# Patient Record
Sex: Female | Born: 1950 | Race: Black or African American | Hispanic: No | Marital: Married | Smoking: Never smoker
Health system: Southern US, Community
[De-identification: ages and names within clinical notes are randomized; demographics above are authoritative.]

## PROBLEM LIST (undated history)

## (undated) DIAGNOSIS — C50319 Malignant neoplasm of lower-inner quadrant of unspecified female breast: Principal | ICD-10-CM

## (undated) DIAGNOSIS — Z9221 Personal history of antineoplastic chemotherapy: Secondary | ICD-10-CM

## (undated) DIAGNOSIS — I4819 Other persistent atrial fibrillation: Secondary | ICD-10-CM

## (undated) DIAGNOSIS — Z22322 Carrier or suspected carrier of Methicillin resistant Staphylococcus aureus: Secondary | ICD-10-CM

## (undated) DIAGNOSIS — G709 Myoneural disorder, unspecified: Secondary | ICD-10-CM

## (undated) DIAGNOSIS — I890 Lymphedema, not elsewhere classified: Secondary | ICD-10-CM

## (undated) DIAGNOSIS — R011 Cardiac murmur, unspecified: Secondary | ICD-10-CM

## (undated) DIAGNOSIS — M1990 Unspecified osteoarthritis, unspecified site: Secondary | ICD-10-CM

## (undated) DIAGNOSIS — G4733 Obstructive sleep apnea (adult) (pediatric): Secondary | ICD-10-CM

## (undated) DIAGNOSIS — IMO0001 Reserved for inherently not codable concepts without codable children: Secondary | ICD-10-CM

## (undated) DIAGNOSIS — Z923 Personal history of irradiation: Secondary | ICD-10-CM

## (undated) DIAGNOSIS — E237 Disorder of pituitary gland, unspecified: Secondary | ICD-10-CM

## (undated) DIAGNOSIS — H409 Unspecified glaucoma: Secondary | ICD-10-CM

## (undated) DIAGNOSIS — I10 Essential (primary) hypertension: Secondary | ICD-10-CM

## (undated) DIAGNOSIS — C50919 Malignant neoplasm of unspecified site of unspecified female breast: Secondary | ICD-10-CM

## (undated) DIAGNOSIS — E785 Hyperlipidemia, unspecified: Secondary | ICD-10-CM

## (undated) DIAGNOSIS — Z5189 Encounter for other specified aftercare: Secondary | ICD-10-CM

## (undated) HISTORY — DX: Other persistent atrial fibrillation: I48.19

## (undated) HISTORY — DX: Essential (primary) hypertension: I10

## (undated) HISTORY — DX: Malignant neoplasm of unspecified site of unspecified female breast: C50.919

## (undated) HISTORY — DX: Personal history of antineoplastic chemotherapy: Z92.21

## (undated) HISTORY — DX: Carrier or suspected carrier of methicillin resistant Staphylococcus aureus: Z22.322

## (undated) HISTORY — PX: BREAST SURGERY: SHX581

## (undated) HISTORY — DX: Malignant neoplasm of lower-inner quadrant of unspecified female breast: C50.319

## (undated) HISTORY — PX: EYE SURGERY: SHX253

## (undated) HISTORY — DX: Obstructive sleep apnea (adult) (pediatric): G47.33

## (undated) HISTORY — PX: DILATION AND CURETTAGE OF UTERUS: SHX78

## (undated) HISTORY — DX: Lymphedema, not elsewhere classified: I89.0

## (undated) HISTORY — PX: TUBAL LIGATION: SHX77

## (undated) HISTORY — PX: CARPAL TUNNEL RELEASE: SHX101

## (undated) HISTORY — PX: OTHER SURGICAL HISTORY: SHX169

---

## 1985-05-26 HISTORY — PX: CHOLECYSTECTOMY: SHX55

## 1997-06-09 ENCOUNTER — Encounter: Payer: Self-pay | Admitting: Pulmonary Disease

## 1997-11-04 ENCOUNTER — Emergency Department (HOSPITAL_COMMUNITY): Admission: EM | Admit: 1997-11-04 | Discharge: 1997-11-04 | Payer: Self-pay | Admitting: *Deleted

## 1998-06-15 ENCOUNTER — Ambulatory Visit: Admission: RE | Admit: 1998-06-15 | Discharge: 1998-06-15 | Payer: Self-pay | Admitting: *Deleted

## 1999-03-26 ENCOUNTER — Other Ambulatory Visit: Admission: RE | Admit: 1999-03-26 | Discharge: 1999-03-26 | Payer: Self-pay | Admitting: Obstetrics and Gynecology

## 1999-03-29 ENCOUNTER — Ambulatory Visit (HOSPITAL_COMMUNITY): Admission: RE | Admit: 1999-03-29 | Discharge: 1999-03-29 | Payer: Self-pay | Admitting: Obstetrics and Gynecology

## 1999-03-29 ENCOUNTER — Encounter: Payer: Self-pay | Admitting: Obstetrics and Gynecology

## 1999-04-03 ENCOUNTER — Encounter (INDEPENDENT_AMBULATORY_CARE_PROVIDER_SITE_OTHER): Payer: Self-pay | Admitting: Specialist

## 1999-04-03 ENCOUNTER — Ambulatory Visit (HOSPITAL_COMMUNITY): Admission: RE | Admit: 1999-04-03 | Discharge: 1999-04-03 | Payer: Self-pay | Admitting: Obstetrics and Gynecology

## 1999-04-03 ENCOUNTER — Encounter: Payer: Self-pay | Admitting: Obstetrics and Gynecology

## 1999-04-10 ENCOUNTER — Encounter: Payer: Self-pay | Admitting: Obstetrics and Gynecology

## 1999-04-10 ENCOUNTER — Encounter: Admission: RE | Admit: 1999-04-10 | Discharge: 1999-04-10 | Payer: Self-pay | Admitting: Obstetrics and Gynecology

## 2000-04-08 ENCOUNTER — Encounter: Payer: Self-pay | Admitting: Pulmonary Disease

## 2000-04-09 ENCOUNTER — Encounter: Admission: RE | Admit: 2000-04-09 | Discharge: 2000-07-08 | Payer: Self-pay | Admitting: Pulmonary Disease

## 2000-04-10 ENCOUNTER — Encounter: Admission: RE | Admit: 2000-04-10 | Discharge: 2000-04-10 | Payer: Self-pay | Admitting: Obstetrics and Gynecology

## 2000-04-10 ENCOUNTER — Encounter: Payer: Self-pay | Admitting: Obstetrics and Gynecology

## 2000-04-28 ENCOUNTER — Other Ambulatory Visit: Admission: RE | Admit: 2000-04-28 | Discharge: 2000-04-28 | Payer: Self-pay | Admitting: Obstetrics and Gynecology

## 2000-05-09 ENCOUNTER — Encounter: Payer: Self-pay | Admitting: Pulmonary Disease

## 2000-05-09 ENCOUNTER — Ambulatory Visit (HOSPITAL_BASED_OUTPATIENT_CLINIC_OR_DEPARTMENT_OTHER): Admission: RE | Admit: 2000-05-09 | Discharge: 2000-05-09 | Payer: Self-pay | Admitting: Pulmonary Disease

## 2000-07-23 ENCOUNTER — Encounter: Admission: RE | Admit: 2000-07-23 | Discharge: 2000-10-21 | Payer: Self-pay | Admitting: Pulmonary Disease

## 2001-01-16 ENCOUNTER — Emergency Department (HOSPITAL_COMMUNITY): Admission: EM | Admit: 2001-01-16 | Discharge: 2001-01-16 | Payer: Self-pay

## 2001-04-12 ENCOUNTER — Encounter: Payer: Self-pay | Admitting: Obstetrics and Gynecology

## 2001-04-12 ENCOUNTER — Encounter: Admission: RE | Admit: 2001-04-12 | Discharge: 2001-04-12 | Payer: Self-pay | Admitting: Obstetrics and Gynecology

## 2002-05-04 ENCOUNTER — Encounter: Payer: Self-pay | Admitting: Obstetrics and Gynecology

## 2002-05-04 ENCOUNTER — Encounter: Admission: RE | Admit: 2002-05-04 | Discharge: 2002-05-04 | Payer: Self-pay | Admitting: Obstetrics and Gynecology

## 2002-05-23 ENCOUNTER — Other Ambulatory Visit: Admission: RE | Admit: 2002-05-23 | Discharge: 2002-05-23 | Payer: Self-pay | Admitting: Obstetrics and Gynecology

## 2002-12-30 ENCOUNTER — Emergency Department (HOSPITAL_COMMUNITY): Admission: AD | Admit: 2002-12-30 | Discharge: 2002-12-30 | Payer: Self-pay | Admitting: Emergency Medicine

## 2003-03-13 ENCOUNTER — Ambulatory Visit (HOSPITAL_COMMUNITY): Admission: RE | Admit: 2003-03-13 | Discharge: 2003-03-13 | Payer: Self-pay | Admitting: Gastroenterology

## 2003-03-13 ENCOUNTER — Encounter (INDEPENDENT_AMBULATORY_CARE_PROVIDER_SITE_OTHER): Payer: Self-pay | Admitting: Specialist

## 2003-05-22 ENCOUNTER — Encounter: Admission: RE | Admit: 2003-05-22 | Discharge: 2003-05-22 | Payer: Self-pay | Admitting: Obstetrics and Gynecology

## 2003-07-27 ENCOUNTER — Encounter (INDEPENDENT_AMBULATORY_CARE_PROVIDER_SITE_OTHER): Payer: Self-pay | Admitting: Specialist

## 2003-07-27 ENCOUNTER — Ambulatory Visit (HOSPITAL_COMMUNITY): Admission: RE | Admit: 2003-07-27 | Discharge: 2003-07-27 | Payer: Self-pay | Admitting: Obstetrics and Gynecology

## 2003-09-29 ENCOUNTER — Encounter: Admission: RE | Admit: 2003-09-29 | Discharge: 2003-09-29 | Payer: Self-pay | Admitting: General Surgery

## 2003-10-02 ENCOUNTER — Encounter (INDEPENDENT_AMBULATORY_CARE_PROVIDER_SITE_OTHER): Payer: Self-pay | Admitting: *Deleted

## 2003-10-02 ENCOUNTER — Ambulatory Visit (HOSPITAL_COMMUNITY): Admission: RE | Admit: 2003-10-02 | Discharge: 2003-10-02 | Payer: Self-pay | Admitting: General Surgery

## 2003-10-02 ENCOUNTER — Ambulatory Visit (HOSPITAL_BASED_OUTPATIENT_CLINIC_OR_DEPARTMENT_OTHER): Admission: RE | Admit: 2003-10-02 | Discharge: 2003-10-02 | Payer: Self-pay | Admitting: General Surgery

## 2004-04-10 ENCOUNTER — Encounter: Admission: RE | Admit: 2004-04-10 | Discharge: 2004-04-10 | Payer: Self-pay | Admitting: Family Medicine

## 2004-05-22 ENCOUNTER — Ambulatory Visit (HOSPITAL_COMMUNITY): Admission: RE | Admit: 2004-05-22 | Discharge: 2004-05-22 | Payer: Self-pay | Admitting: Obstetrics and Gynecology

## 2004-06-05 ENCOUNTER — Encounter: Admission: RE | Admit: 2004-06-05 | Discharge: 2004-06-05 | Payer: Self-pay | Admitting: Obstetrics and Gynecology

## 2004-06-24 ENCOUNTER — Encounter: Admission: RE | Admit: 2004-06-24 | Discharge: 2004-06-24 | Payer: Self-pay | Admitting: Obstetrics and Gynecology

## 2004-07-24 ENCOUNTER — Ambulatory Visit: Payer: Self-pay | Admitting: Pulmonary Disease

## 2004-09-26 ENCOUNTER — Encounter: Admission: RE | Admit: 2004-09-26 | Discharge: 2004-09-26 | Payer: Self-pay | Admitting: Neurosurgery

## 2005-04-02 ENCOUNTER — Encounter: Admission: RE | Admit: 2005-04-02 | Discharge: 2005-04-02 | Payer: Self-pay | Admitting: Neurosurgery

## 2005-05-23 ENCOUNTER — Encounter: Admission: RE | Admit: 2005-05-23 | Discharge: 2005-05-23 | Payer: Self-pay | Admitting: Obstetrics and Gynecology

## 2005-05-26 HISTORY — PX: ABDOMINAL HYSTERECTOMY: SHX81

## 2005-06-06 ENCOUNTER — Other Ambulatory Visit: Admission: RE | Admit: 2005-06-06 | Discharge: 2005-06-06 | Payer: Self-pay | Admitting: Obstetrics and Gynecology

## 2005-10-01 ENCOUNTER — Encounter: Admission: RE | Admit: 2005-10-01 | Discharge: 2005-10-01 | Payer: Self-pay | Admitting: Neurosurgery

## 2005-10-30 ENCOUNTER — Ambulatory Visit (HOSPITAL_COMMUNITY): Admission: RE | Admit: 2005-10-30 | Discharge: 2005-10-30 | Payer: Self-pay | Admitting: Obstetrics and Gynecology

## 2005-10-30 ENCOUNTER — Encounter (INDEPENDENT_AMBULATORY_CARE_PROVIDER_SITE_OTHER): Payer: Self-pay | Admitting: *Deleted

## 2005-12-12 ENCOUNTER — Ambulatory Visit: Payer: Self-pay | Admitting: Pulmonary Disease

## 2006-05-25 ENCOUNTER — Encounter: Admission: RE | Admit: 2006-05-25 | Discharge: 2006-05-25 | Payer: Self-pay | Admitting: Obstetrics and Gynecology

## 2006-06-19 ENCOUNTER — Ambulatory Visit: Payer: Self-pay | Admitting: Pulmonary Disease

## 2007-04-06 ENCOUNTER — Encounter: Admission: RE | Admit: 2007-04-06 | Discharge: 2007-04-06 | Payer: Self-pay | Admitting: Neurosurgery

## 2007-05-28 ENCOUNTER — Encounter: Admission: RE | Admit: 2007-05-28 | Discharge: 2007-05-28 | Payer: Self-pay | Admitting: Obstetrics and Gynecology

## 2007-09-16 ENCOUNTER — Encounter (INDEPENDENT_AMBULATORY_CARE_PROVIDER_SITE_OTHER): Payer: Self-pay | Admitting: Obstetrics and Gynecology

## 2007-09-16 ENCOUNTER — Ambulatory Visit (HOSPITAL_COMMUNITY): Admission: RE | Admit: 2007-09-16 | Discharge: 2007-09-17 | Payer: Self-pay | Admitting: Obstetrics and Gynecology

## 2008-03-28 ENCOUNTER — Encounter: Admission: RE | Admit: 2008-03-28 | Discharge: 2008-03-28 | Payer: Self-pay | Admitting: Neurosurgery

## 2008-05-29 ENCOUNTER — Encounter: Admission: RE | Admit: 2008-05-29 | Discharge: 2008-05-29 | Payer: Self-pay | Admitting: Obstetrics and Gynecology

## 2008-11-16 DIAGNOSIS — J45909 Unspecified asthma, uncomplicated: Secondary | ICD-10-CM | POA: Insufficient documentation

## 2008-11-16 DIAGNOSIS — G4733 Obstructive sleep apnea (adult) (pediatric): Secondary | ICD-10-CM | POA: Insufficient documentation

## 2008-11-16 DIAGNOSIS — I10 Essential (primary) hypertension: Secondary | ICD-10-CM | POA: Insufficient documentation

## 2008-11-17 ENCOUNTER — Ambulatory Visit: Payer: Self-pay | Admitting: Pulmonary Disease

## 2009-03-28 ENCOUNTER — Encounter: Admission: RE | Admit: 2009-03-28 | Discharge: 2009-03-28 | Payer: Self-pay | Admitting: Neurosurgery

## 2009-06-12 ENCOUNTER — Encounter: Admission: RE | Admit: 2009-06-12 | Discharge: 2009-06-12 | Payer: Self-pay | Admitting: Obstetrics and Gynecology

## 2009-11-22 ENCOUNTER — Ambulatory Visit: Payer: Self-pay | Admitting: Pulmonary Disease

## 2010-04-02 ENCOUNTER — Encounter: Admission: RE | Admit: 2010-04-02 | Discharge: 2010-04-02 | Payer: Self-pay | Admitting: Neurosurgery

## 2010-06-13 ENCOUNTER — Encounter
Admission: RE | Admit: 2010-06-13 | Discharge: 2010-06-13 | Payer: Self-pay | Source: Home / Self Care | Attending: Obstetrics and Gynecology | Admitting: Obstetrics and Gynecology

## 2010-06-15 ENCOUNTER — Encounter: Payer: Self-pay | Admitting: Obstetrics and Gynecology

## 2010-06-20 ENCOUNTER — Encounter
Admission: RE | Admit: 2010-06-20 | Discharge: 2010-06-20 | Payer: Self-pay | Source: Home / Self Care | Attending: Obstetrics and Gynecology | Admitting: Obstetrics and Gynecology

## 2010-06-25 NOTE — Assessment & Plan Note (Signed)
Summary: rov for osa   CC:  Pt is here for a 1 yr f/u appt on her OSA.   Pt states she is wearing her cpap machine every night.  Approx 8 hours.   Pt denied any complaints with mask or pressure.   Pt does state occ dry mouth d/t cpap .Marland Kitchen  History of Present Illness: the pt comes in today for f/u of her osa.  She is wearing cpap compliantly, and reports no issues with her mask fit or pressure.  She sleeps well at night, and is satisfied with her daytime alertness.  Her only complaint is that of mouth dryness.  She wears a nasal mask, and thinks she may be mouth opening at times?  She has not tried to turn heater up on her humidifier.  She has lost 11 pounds since her last visit here.  Medications Prior to Update: 1)  Advair Diskus 100-50 Mcg/dose  Misc (Fluticasone-Salmeterol) .... One Puff Twice Daily 2)  Avalide 300-12.5 Mg Tabs (Irbesartan-Hydrochlorothiazide) .... Take 1 Tablet By Mouth Once A Day 3)  Lasix 20 Mg Tabs (Furosemide) .... Take 1 Tablet By Mouth Once A Day 4)  Norvasc 5 Mg Tabs (Amlodipine Besylate) .... Take 1 Tablet By Mouth Once A Day 5)  Cabergoline 0.5 Mg Tabs (Cabergoline) .... Take 1 Tab By Mouth Once A Week 6)  Lumigan 0.03 % Soln (Bimatoprost) .Marland Kitchen.. 1 Drop in Each Eye Once Daily  Allergies (verified): 1)  ! * Tarka  Review of Systems       The patient complains of shortness of breath with activity, productive cough, and nasal congestion/difficulty breathing through nose.  The patient denies shortness of breath at rest, non-productive cough, coughing up blood, chest pain, irregular heartbeats, acid heartburn, indigestion, loss of appetite, weight change, abdominal pain, difficulty swallowing, sore throat, tooth/dental problems, headaches, sneezing, itching, ear ache, anxiety, depression, hand/feet swelling, joint stiffness or pain, rash, change in color of mucus, and fever.    Vital Signs:  Patient profile:   60 year old female Height:      62 inches Weight:      284  pounds BMI:     52.13 O2 Sat:      96 % on Room air Temp:     97.7 degrees F oral Pulse rate:   82 / minute BP sitting:   120 / 60  (right arm) Cuff size:   large  Vitals Entered By: Arman Filter LPN (November 22, 2009 10:29 AM)  O2 Flow:  Room air CC: Pt is here for a 1 yr f/u appt on her OSA.   Pt states she is wearing her cpap machine every night.  Approx 8 hours.   Pt denied any complaints with mask or pressure.   Pt does state occ dry mouth d/t cpap . Comments Medications reviewed with patient Arman Filter LPN  November 22, 2009 10:29 AM    Physical Exam  General:  obese female in nad Nose:  no skin breakdown or pressure necrosis from cpap mask Extremities:  mild edema, no cyanosis Neurologic:  alert, not sleepy, moves all 4.   Impression & Recommendations:  Problem # 1:  OBSTRUCTIVE SLEEP APNEA (ICD-327.23) the pt is doing well with her cpap, and is satisfied with her sleep and daytime alertness. She is losing weight slowly, and I have asked her to continue with this.  I suspect her dryness is either due to inadequate humidity on her cpap machine vs. occult mouth opening.  I have asked her to turn the heat up on her humidifier, and if that doesn't help may have to consider a chin strap.    Other Orders: Est. Patient Level III (80998)  Patient Instructions: 1)  continue to work on weight loss, you are doing well. 2)  turn heat up on your humidifier to see if dry mouth gets better.  If it doesn't, would consider whether to try chin strap to prevent mouth opening. 3)  followup with me in 1 year.   Immunization History:  Influenza Immunization History:    Influenza:  historical (04/25/2009)  Pneumovax Immunization History:    Pneumovax:  historical (04/25/2009)

## 2010-10-08 NOTE — H&P (Signed)
[name], [name]               ACCOUNT NO.:  [id number]   MEDICAL RECORD NO.:  [id number]          PATIENT TYPE:  AMB   LOCATION:  [facility]C                           FACILITY:  [facility]   PHYSICIAN:  Zenaida Niece, M.D.DATE OF BIRTH:  Jan 26, 1951   DATE OF ADMISSION:  09/16/2007  DATE OF DISCHARGE:                              HISTORY & PHYSICAL   CHIEF COMPLAINT:  Postmenopausal bleeding.   HISTORY OF PRESENT ILLNESS:  This is a 60 year old black female, para 2-  0-0-2, who has previously had 2 hysteroscopy D&Cs for postmenopausal  bleeding.  I saw her for her annual exam in March of this year.  At that  time, she stated she had had spotting off and on for 3 months.  Pelvic  exam was unremarkable.  She had a pelvic ultrasound performed on April  2.  This revealed a 22-mm endometrium but an otherwise normal uterus.  Ovaries were not seen.  She has previously had again D&Cs, the most  recent in June 2007 with benign endometrial tissue with features of  exogenous progestin.  Thus I do not feel this is precancer or cancer.  I  offered her repeat hysteroscopy, D&C or hysterectomy since this seems to  be a recurring problem.  She has elected definitive surgical therapy  with hysterectomy and is admitted for this at this time.   PAST OBSTETRICAL HISTORY:  Two vaginal deliveries at term without  complications.   PAST MEDICAL HISTORY:  Hypertension, asthma, and a pituitary  macroadenoma.  Possible glaucoma.   PAST SURGICAL HISTORY:  Hysteroscopy x3, bilateral tubal ligation,  cholecystectomy and D&Cs.   ALLERGIES:  TARKA.   CURRENT MEDICATIONS:  She is on eye drops, Advair, Norvasc, cabergoline,  and Alcaine.   SOCIAL HISTORY:  She is married and denies alcohol, tobacco or drug use.   FAMILY HISTORY:  No GYN or colon cancer.   REVIEW OF SYSTEMS:  She has normal bowel and bladder function although  she does feel bloated.  She has no chest pain or shortness of breath.   PHYSICAL  EXAMINATION:  VITAL SIGNS:  Weight is 293 pounds.  GENERAL:  This is an obese black female in no acute distress.  NECK:  Supple without lymphadenopathy or thyromegaly.  LUNGS:  Clear to auscultation.  HEART:  Regular rate and rhythm without murmur.  ABDOMEN:  Soft, nontender, nondistended without palpable mass.  EXTREMITIES:  Have no edema and are nontender.  PELVIC:  External genitalia had no lesions.  On speculum exam, the  cervix is normal.  On bimanual exam, she has no specific masses although  it is difficult to palpate her uterus due to her body habitus.   ASSESSMENT:  Recurrent postmenopausal bleeding.  She has had benign  pathology in the past.  This is a recurrent problem and the patient  wishes to undergo definitive surgical therapy.  Risks of surgery have  been discussed with the patient, and she understands.  The plan is to  admit the patient on the day of surgery for vaginal hysterectomy and  cystoscopy.  Zenaida Niece, M.D.  Electronically Signed     TDM/MEDQ  D:  09/15/2007  T:  09/15/2007  Job:  [id number]

## 2010-10-08 NOTE — Op Note (Signed)
NAMEDEVYN, [name]               ACCOUNT NO.:  [id number]   MEDICAL RECORD NO.:  [id number]          PATIENT TYPE:  AMB   LOCATION:  SDC                           [facility]ACILITY:  [facility]   PHYSICIAN:  Zenaida Niece, M.D.DATE OF BIRTH:  1951-02-22   DATE OF PROCEDURE:  09/16/2007  DATE OF DISCHARGE:                               OPERATIVE REPORT   PREOPERATIVE DIAGNOSIS:  Recurrent postmenopausal bleeding.   POSTOPERATIVE DIAGNOSIS:  Recurrent postmenopausal bleeding.   OPERATION/PROCEDURE:  Transvaginal hysterectomy and cystoscopy   SURGEON:  Zenaida Niece, M.D.   ASSISTANT:  Tracey Harries, M.D.   ANESTHESIA:  Spinal.   FINDINGS:  She had a slightly enlarged but essentially normal uterus and  normal tubes and ovaries.  Via cystoscopy she had a normal bladder and  patent ureters.   SPECIMENS:  Uterus sent to pathology.   ESTIMATED BLOOD LOSS:  200 mL.   COMPLICATIONS:  None.   PROCEDURE IN DETAIL:  The patient was taken to the operating room and  placed in the sitting position.  Dr. Pamalee Leyden instilled spinal anesthesia  and she was placed in the dorsal supine position.  Legs were then put in  mobile stirrups and elevated.  Perineum and vagina were then prepped and  draped in the usual sterile fashion and bladder drained with a latex-  free catheter.  A weighted speculum was inserted into the vagina and  Deaver retractors used anteriorly and laterally.  The cervix was grasped  with Christella Hartigan tenaculums.  The cervicovaginal mucosa was infiltrated with  a dilute solution of Pitressin and incised circumferentially with  electrocautery.  Sharp dissection was then used to further free the  vagina from the cervix.  Anteriorly I was able to push the vagina off of  the cervix and lower uterus, but was not able to initially enter the  peritoneal cavity.  Posteriorly I was able to dissect and enter the  posterior cul-de-sac and place a bonnano speculum into the posterior cul-  de-sac.   Uterosacral ligaments were then clamped, transected and ligated  with #1 chromic and tagged for later use.  At this point I was able to  identify anterior peritoneum and entered it sharply.  A Deaver retractor  was then used to retract the bladder anterior.  Uterine arteries and  cardinal ligaments were clamped, transected and ligated with #1 chromic.  Lower broad ligaments were clamped, transected and ligated with #1  chromic.  I was then able to deliver the uterus posteriorly.  The  remaining utero-ovarian pedicle on the patient's right side was clamped  and transected.  Only a small pedicle remained on the patient's left  side and this was taken down with electrocautery.  The utero-ovarian  pedicle on the right side was doubly ligated with #1 chromic.  Pedicles  were inspected.  There was a small amount of bleeding from the left  utero-ovarian pedicle and this was controlled with with 3-0 Vicryl.  Bleeding from the posterior vaginal cuff was controlled with  electrocautery and with a running locking suture of 2-0 Vicryl to secure  the cuff  to the peritoneum.  Tubes and ovaries were inspected and found  to be normal.  The uterosacral ligaments were then plicated in the  midline with 2-0 silk.  The previously tagged uterosacral pedicles were  then also tied in the midline.  The vagina was then closed in a running  locking fashion with 2-0 Vicryl in a vertical fashion.  This appeared to  achieve good closure and adequate hemostasis.   Attention was turned to cystoscopy.  The patient was given indigo  carmine IV.  A 70-degree cystoscope was inserted and approximately 100  mL of sterile solution was instilled.  The bladder appeared normal.  Both ureteral orifices were easily identified and had very brisk efflux  of blue-tinged urine.  The cystoscope was removed and a Foley catheter  was placed.  All instruments were then removed from the vagina.  The  patient was taken down from stirrups.   I should note that near the  beginning of the surgery it was noted that she had passed some stool  from her enemas.  We placed sterile towels over this to try and cover  the area as best as possible.  It was also difficult to expose the  vaginal cuff.  She had a fairly redundant vagina and the speculum did  not hold it away very well.  Counts were correct x2.  She was given  Ancef 1 gram IV prior to the procedure and had PAS hose on throughout  the procedure.  I should also add that before closing the vagina due to  the stool spillage of the vaginal area was irrigated with sterile saline  solution.      Zenaida Niece, M.D.  Electronically Signed     TDM/MEDQ  D:  09/16/2007  T:  09/16/2007  Job:  [id number]

## 2010-10-11 NOTE — Op Note (Signed)
NAME:  [name], [name]                         ACCOUNT NO.:  [id number]   MEDICAL RECORD NO.:  [id number]                   PATIENT TYPE:  AMB   LOCATION:  SDC                                  [facility]ILITY:  [facility]   PHYSICIAN:  Zenaida Niece, M.D.             DATE OF BIRTH:  [birth year]   DATE OF PROCEDURE:  07/27/2003  DATE OF DISCHARGE:                                 OPERATIVE REPORT   PREOPERATIVE DIAGNOSIS:  Postmenopausal bleeding.   POSTOPERATIVE DIAGNOSIS:  Postmenopausal bleeding.   PROCEDURE:  Hysteroscopy with resection and dilation and curettage.   SURGEON:  Zenaida Niece, M.D.   ANESTHESIA:  Spinal.   ESTIMATED BLOOD LOSS:  50 mL.   Fluid deficit through the scope approximately 130 mL.   FINDINGS:  The uterus sounded to approximately 10 cm, and there was a large  cavity with possible polyps, which were resected.   PROCEDURE IN DETAIL:  The patient was taken to the operating room and placed  in the sitting position. Belva Agee, M.D., instilled spinal anesthesia.  She was then placed in the dorsal supine position and the legs placed in  mobile stirrups.  Perineum and vagina were prepped and draped in the usual  sterile fashion and bladder drained with a red rubber catheter.  Anesthesia  was found to be adequate.  A long Graves speculum was inserted into the  vagina and the anterior lip of the cervix grasped with a single-tooth  tenaculum.  The uterus then sounded to 10 cm and the cervix was easily  dilated to a size 23 dilator.  The Observer hysteroscope was inserted and  poor visualization was achieved.  The hysteroscope was removed and sharp  curettage performed with return of a small amount of tissue.  The  hysteroscope was reinserted again with poor visualization.  It appeared that  there may be some polyps.  The resectoscope was then prepared and inserted  and again with adequate visualization, there did appear to be a few polyps.  Most of the  endometrium was resected with a loop electrode.  This did appear  to remove several polyps.  At the end of the procedure there were no  significant lesions remaining.  The hysteroscope was removed and curettage  performed and polyp forceps used to remove any remaining tissue.  The  hysteroscope was reinserted one more time, and there was no active bleeding  and no other lesions seen.  The hysteroscope was then removed.  The single-  tooth tenaculum was removed and bleeding controlled with pressure.  All  instruments were then removed from the vagina.  The patient was taken to the  recovery room in stable condition after tolerating the procedure well.  Counts were correct x2.  Zenaida Niece, M.D.    TDM/MEDQ  D:  07/27/2003  T:  07/27/2003  Job:  [id number]

## 2010-10-11 NOTE — Op Note (Signed)
NAME:  [name], [name]                         ACCOUNT NO.:  [id number]   MEDICAL RECORD NO.:  [id number]                   PATIENT TYPE:  AMB   LOCATION:  DSC                                  FACILITY:  [facility]   PHYSICIAN:  Anselm Pancoast. Zachery Dakins, M.D.          DATE OF BIRTH:  [birth year]   DATE OF PROCEDURE:  10/03/2003  DATE OF DISCHARGE:                                 OPERATIVE REPORT   PREOPERATIVE DIAGNOSIS:  1. Large lipoma left lateral chest.  2. Morbid obesity.   POSTOPERATIVE DIAGNOSIS:  1. Large lipoma left lateral chest, probably about 4 x 6 inches in total     size.  2. Morbid obesity.   PROCEDURE:   SURGEON:  Anselm Pancoast. Zachery Dakins, M.D.   ANESTHESIA:  General.  Left lateral position on bean bag.   INDICATIONS:  [name] is a 60 year old 5 feet 2 inch, very heavy black  female who was referred to our office for a lipoma that she has recently  noticed on the left lateral chest.  She has a history of sleep apnea and  exogenous obesity and described that this is painful.  It is right under her  bra line, and she is not sure how long it has been there.  She has noticed  it only recently.  She is on blood pressure medication and was referred by  Dr. Dara Hoyer.  The patient desired to have it removed.  I think we can  do it with a general anesthesia and will use local and will need to put her  in the lateral position under a bean bag.   DESCRIPTION OF PROCEDURE:  The patient was positioned, endotracheal tube,  the roll placed under and then she was kind of turned up on her side,  elevating and protecting her left arm over a Mayo and then we taped her in  this position.  The lateral chest wall was prepped with Betadine surgical  solution and draped in a sterile manner.  It is lateral to breast but in the  lateral chest wall but with her body folds, etc., it is the same level as  her left breast.  An incision was made transversely about 4 inches in  length.  The  underlying fatty tissue was identified and then using a  combination of [employer], [employer]sters, and Gelpi retractors, we were able to  free up the area where the fat had kind of pushed into the surrounding areas  kind of creating numerous little pseudo pockets and then once the more  medial aspect was removed then you could get room so that we could move the  area completely.  Numerous little bleeders were sutured with 4-0 Vicryl and  then the after the area was completely excised I then infiltrated with about  20 mL of 0.5% Marcaine with Adrenaline to minimize the postoperative pain.  I placed a small Jackson-Pratt round drain brought out medially  in the wound  and then closed the subcutaneous tissue with interrupted 4-0 Vicryl and then  the skin was closed with staples alternating with some mattress sutures of 3-  0 silk.  The patient tolerated the procedure nicely.  We then rotated her  flat after sterile occlusive dressing had been applied to the stretcher and  then she was extubated and taken to the recovery room in stable  postoperative condition.  Hopefully, she will not require very much pain  medication and we will see if she is having problems with sleep apnea or  anything in the recovery room.  I think that she can be discharged but if there is any question we could  keep her overnight if needed.  The patient tolerated the procedure nicely.  I will see her in the office on Thursday or Friday for removal of the little  Jackson-Pratt drain.                                               Anselm Pancoast. Zachery Dakins, M.D.    WJW/MEDQ  D:  10/02/2003  T:  10/03/2003  Job:  [id number]   cc:   Teena Irani. Arlyce Dice, M.D.  [address]  [place]  [place] [address]  Fax: [phone]

## 2010-10-11 NOTE — Op Note (Signed)
NAME:  [name], [name]                         ACCOUNT NO.:  [id number]   MEDICAL RECORD NO.:  [id number]                   PATIENT TYPE:  AMB   LOCATION:  ENDO                                 FACILITY:  [facility]   PHYSICIAN:  Anselmo Rod, M.D.               DATE OF BIRTH:  06-19-1950   DATE OF PROCEDURE:  03/13/2003  DATE OF DISCHARGE:                                 OPERATIVE REPORT   PROCEDURE:  Colonoscopy with snare polypectomy x 1.   ENDOSCOPIST:  Anselmo Rod, M.D.   INSTRUMENT USED:  Olympus video colonoscope.   INDICATIONS FOR PROCEDURE:  This 60 year old African-American female with a  history of rectal bleeding.  The patient was found to have bright red blood  on the examining finger in the office on the initial evaluation.  Rule out  colonic polyps, masses, etc.   PRE-PROCEDURE PREPARATION:  An informed consent was procured from the  patient and the patient was fasted for eight hours prior to the procedure,  and prepped with a bottle of magnesium citrate and one gallon of GoLYTELY on  the night prior to the procedure.   PRE-PROCEDURE PHYSICAL EXAMINATION:  VITAL SIGNS:  Stable.  NECK:  Supple.  CHEST:  Clear to auscultation.  HEART:  S1, S2 regular.  No murmur, rub or gallop.  LUNGS:  No rhonchi or wheezing.  ABDOMEN:  Soft with normal bowel sounds.   DESCRIPTION OF PROCEDURE:  The patient was placed in the left lateral  decubitus position and sedated with 70 mg of Demerol and 7 mg of Versed  intravenously.  Once the patient was adequately sedated and maintained on  low-flow oxygen and continuous cardiac monitoring, the Olympus video  colonoscope was advanced from the rectum to the cecum without difficulty.  The patient had a healthy-appearing cecum.  The appendicular orifice and  ileocecal valve were clearly visualized and photographed.  A small sessile  polyp was snared from 45 cm.  Internal hemorrhoids were seen on  retroflexion.  No evidence of mucosal  bleeding could be identified.  No  large polyps or masses were present.  There was no evidence of  diverticulosis.   IMPRESSION:  1. Small internal hemorrhoids.  2. Small sessile polyps snared from 45 cm.  3. Normal-appearing transverse colon, right colon and cecum.    RECOMMENDATIONS:  1. Await pathology results.  2. Upper GI and small bowel follow-through if rectal bleeding persists.  3. Outpatient followup in the next two weeks for further recommendations.                                                  Anselmo Rod, M.D.    JNM/MEDQ  D:  03/13/2003  T:  03/13/2003  Job:  [id number]

## 2010-10-11 NOTE — Op Note (Signed)
[name], [name]               ACCOUNT NO.:  [id number]   MEDICAL RECORD NO.:  [id number]          PATIENT TYPE:  AMB   LOCATION:  [facility]C                           [facility]   PHYSICIAN:  Zenaida Niece, M.D.DATE OF BIRTH:  [birth year]   DATE OF PROCEDURE:  10/31/2005  DATE OF DISCHARGE:                                 OPERATIVE REPORT   PREOPERATIVE DIAGNOSIS:  Postmenopausal bleeding.   POSTOPERATIVE DIAGNOSIS:  Postmenopausal bleeding.   PROCEDURE:  Hysteroscopy with dilation and curettage.   SURGEON:  Zenaida Niece, M.D.   ANESTHESIA:  Spinal.   SPECIMENS:  Endometrial curettings.   ESTIMATED BLOOD LOSS:  50 mL.   COMPLICATIONS:  None.   The fluid deficit through the hysteroscope was 85 mL.   FINDINGS:  She had a large endometrial cavity with fairly abundant  endometrial tissue but no specific polyps or myomas.   PROCEDURE IN DETAIL:  The patient was taken to the operating room and placed  in the sitting position.  Dr. Malen Gauze instilled spinal anesthesia and she was  then placed in the dorsal supine position.  Legs were then placed in mobile  stirrups.  Perineum and vagina were then prepped and draped in the usual  sterile fashion and bladder drained with a red rubber catheter.  A Graves  speculum was inserted into the vagina and the anterior lip of the cervix was  grasped with a single-tooth tenaculum.  Uterus then sounded to approximately  10 cm and the cervix was easily dilated to a size 23 dilator.  The observer  hysteroscope was inserted and good visualization was achieved.  She has a  very large endometrial cavity, again with no specific lesions but fairly  abundant tissue.  The hysteroscope was removed and sharp curettage was  performed with the return of a small amount of tissue and good uterine cry  in all quadrants.  The hysteroscope was again inserted and good  visualization achieved.  There still appeared to be a small amount of tissue  remaining.  The hysteroscope was removed and curettage was again performed  with return of  minimal tissue.  The single-tooth tenaculum was then removed and bleeding  controlled with pressure.  The patient was taken down from stirrups.  She  tolerated the procedure well and was taken to the recovery room in stable  condition.  Counts were correct and she had PAS hose on throughout the  procedure.      Zenaida Niece, M.D.  Electronically Signed     TDM/MEDQ  D:  10/30/2005  T:  10/31/2005  Job:  [id number]

## 2010-10-11 NOTE — H&P (Signed)
[name], KUEKER               ACCOUNT NO.:  [id number]   MEDICAL RECORD NO.:  [id number]          PATIENT TYPE:  AMB   LOCATION:  [facility]                           FACILITY:  [facility]   PHYSICIAN:  Zenaida Niece, M.D.DATE OF BIRTH:  Nov 19, 1950   DATE OF ADMISSION:  10/30/2005  DATE OF DISCHARGE:                                HISTORY & PHYSICAL   CHIEF COMPLAINT:  Abnormal uterine bleeding.   HISTORY OF PRESENT ILLNESS:  This is a 8o black female, para 2-0-0-2, who  was seen for an annual exam in January of this year.  At that time, she  complained of bleeding every few months.  She has previously had a  hysteroscopy with D&C in March 2005.  That revealed a large cavity with  possible polyps which were resected and pathology revealed proliferative  endometrium with simple hyperplasia without atypia associated with  endometrial polyps.  She had an endometrial biopsy performed on January 25,  which revealed simple hyperplasia without atypia.  At that time, she was put  on Megace 40 mg daily and was seen on April 26 for repeat biopsy.  At that  time, endometrial biopsy revealed a benign endometrial polyp with  endometrium with marked progestin effect.  She continues to have irregular  bleeding.  She has been on Aygstin to help stop bleeding in preparation for  hysteroscopy with D&C.   PAST OB HISTORY:  Two vaginal deliveries at term without complications.   PAST MEDICAL HISTORY:  1.  Hypertension.  2.  Asthma.  3.  A pituitary macroadenoma.   PAST SURGICAL HISTORY:  1.  Hysteroscopy x2.  2.  Tubal ligation.  3.  Cholecystectomy.  4.  D&C's.   ALLERGIES:  An ANTIHYPERTENSIVE for which she does not remember the name.   CURRENT MEDICATIONS:  Advair, Norvasc, and cabergoline .   SOCIAL HISTORY:  The patient is married and denies alcohol, tobacco, or drug  use.   FAMILY HISTORY:  No gyn or colon cancer.   REVIEW OF SYSTEMS:  Normal bowel and bladder function.   PHYSICAL  EXAMINATION:  VITAL SIGNS:  Weight is 314 pounds. blood pressure is  140/98.  GENERAL:  This is an obese black female in no acute distress.  NECK:  Supple without lymphadenopathy or thyromegaly.  LUNGS:  Clear to auscultation, but there was a small inspiratory volume.  HEART:  Regular rate and rhythm with a 2/6 systolic murmur.  ABDOMEN:  Obese, soft, nontender, nondistended without palpable masses  EXTREMITIES:  No edema and are nontender.  PELVIC EXAM:  External genitalia reveals decreased pigmentation but no  specific lesions.  On speculum exam, cervix is normal, and Pap smear was  performed which reveals a normal Pap smear with negative high risk HPV.  Bimanual exam is a difficult exam due to her body habitus, but there are no  definite masses.  This is confirmed by rectovaginal exam.   ASSESSMENT:  Persistent postmenopausal bleeding with recent endometrial  hyperplasia and most recent biopsy with a possible endometrial polyp.  Options have been discussed with the patient, and she  continues to bleed  despite therapy with progesterone.  She wishes to proceed with repeat  hysteroscopy with D&C.  Risks of surgery have been discussed with the  patient, and she is familiar with these risks from her prior procedures.   PLAN:  Admit the patient for a hysteroscopy with D&C and possible resection.      Zenaida Niece, M.D.  Electronically Signed     TDM/MEDQ  D:  10/29/2005  T:  10/29/2005  Job:  [id number]

## 2010-10-11 NOTE — H&P (Signed)
NAME:  [name], [name]                         ACCOUNT NO.:  [id number]   MEDICAL RECORD NO.:  [id number]                   PATIENT TYPE:  AMB   LOCATION:  [facility]                                  FACILITY:  [facility]   PHYSICIAN:  Zenaida Niece, M.D.             DATE OF BIRTH:  Jul 24, 1950   DATE OF ADMISSION:  DATE OF DISCHARGE:                                HISTORY & PHYSICAL   CHIEF COMPLAINT:  Postmenopausal bleeding.   HISTORY OF PRESENT ILLNESS:  This is a 60 year old black female para 2, 0,  0, 2 who was seen by our nurse practitioner in December 2004 for her annual  exam.  At that time, she said she had no vaginal bleeding since June 2004  but did have some spotting in December.  She previously had a Pipelle in  December 2003 which revealed simple hyperplasia. She took Provera every  month through June 2004 and had menses every month, but then stopped taking  it and had no bleeding until December.  She had a hysteroscopy in November  2000 with polyp and simple hyperplasia.   Due to this history and her postmenopausal bleeding, I brought her in to  attempt an ultrasound, but was unable to even adequately visualize the  cervix to proceed with endometrial ablation.  She did have a thickened  endometrium of 1.12 cm. Uterus measured 9 x 4 x 4 cm and she had no adnexal  masses.  Thus, the patient was admitted for hysteroscopy with D&C due to  this postmenopausal bleeding.   OBSTETRICAL HISTORY:  Two vaginal deliveries without complications.   PAST MEDICAL HISTORY:  Hypertension and asthma.   PAST SURGICAL HISTORY:  Previous hysteroscopy, tubal ligation,  cholecystectomy and D&C's.   ALLERGIES:  A BLOOD PRESSURE MEDICINE, BUT IS NOT AWARE OF THE NAME.   CURRENT MEDICATIONS:  Tarka, Advair, Norvasc.   SOCIAL HISTORY:  Patient is married and denies alcohol, tobacco or drug use.   FAMILY HISTORY:  No GYN or colon cancer.   REVIEW OF SYSTEMS:  Otherwise  negative.   PHYSICAL  EXAMINATION:  VITAL SIGNS:  Weight 320 pounds, blood pressure  132/80, pulse 78.  GENERAL:  This is an obese black female in no acute distress.  NECK:  Supple without lymphadenopathy or thyromegaly.  LUNGS:  Clear to auscultation.  CARDIOVASCULAR:  Regular rate and rhythm without murmur.  ABDOMEN:  Obese, nontender without palpable masses.  PELVIC:  External genitalia revealed some areas of decreased pigmentation.  On bimanual exam, it is difficult to appreciate anything due to her obesity.   ASSESSMENT:  Postmenopausal bleeding with a slightly thickened endometrium  by ultrasound and inability to do saline infusion.   Risks of surgery including bleeding, infection, damage to other organs and  uterine perforation have been discussed with the patient. She has undergone  this procedure before.   PLAN:  Admit the patient for hysteroscopy with  D&C.                                               Zenaida Niece, M.D.    TDM/MEDQ  D:  07/26/2003  T:  07/26/2003  Job:  [id number]

## 2010-11-20 ENCOUNTER — Encounter: Payer: Self-pay | Admitting: Pulmonary Disease

## 2010-11-21 ENCOUNTER — Ambulatory Visit (INDEPENDENT_AMBULATORY_CARE_PROVIDER_SITE_OTHER): Admitting: Pulmonary Disease

## 2010-11-21 ENCOUNTER — Encounter: Payer: Self-pay | Admitting: Pulmonary Disease

## 2010-11-21 VITALS — BP 122/70 | HR 82 | Temp 98.2°F | Ht 62.5 in | Wt 293.8 lb

## 2010-11-21 DIAGNOSIS — G4733 Obstructive sleep apnea (adult) (pediatric): Secondary | ICD-10-CM

## 2010-11-21 NOTE — Assessment & Plan Note (Signed)
The pt is wearing cpap compliantly, and denies any issues with mask or pressure.  She feels she is sleeping well, and has adequate daytime alertness.  I have asked her to work aggressively on weight loss, and to follow up with me again in one year.

## 2010-11-21 NOTE — Patient Instructions (Signed)
Continue working on weight loss Stay on cpap, and keep up with supplies and mask changes. followup with me in one year.

## 2010-11-21 NOTE — Progress Notes (Signed)
  Subjective:    Patient ID: [name], female    DOB: 01-01-1951, 60 y.o.   MRN: [id number]  HPI The pt comes in today for f/u of her known osa.  She is wearing cpap compliantly, and is not having any issues with mask fit or pressure.  Unfortunately, she has gained 9 pounds since the last visit, but is going to [employer].  She has been getting a new mask every 6mos, and is keeping up with supplies.     Review of Systems  Constitutional: Negative for fever and unexpected weight change.  HENT: Positive for ear pain. Negative for nosebleeds, congestion, sore throat, rhinorrhea, sneezing, trouble swallowing, dental problem, postnasal drip and sinus pressure.   Eyes: Negative for redness and itching.  Respiratory: Positive for cough, chest tightness, shortness of breath and wheezing.   Cardiovascular: Positive for palpitations. Negative for leg swelling.  Gastrointestinal: Negative for nausea and vomiting.  Genitourinary: Negative for dysuria.  Musculoskeletal: Positive for joint swelling.  Skin: Negative for rash.  Neurological: Negative for headaches.  Hematological: Does not bruise/bleed easily.  Psychiatric/Behavioral: Negative for dysphoric mood. The patient is not nervous/anxious.        Objective:   Physical Exam Morbidly obese female in nad No skin breakdown or pressure necrosis from cpap mask LE with 1+ edema, no cyanosis  Alert, oriented, moves all 4        Assessment & Plan:

## 2010-12-19 ENCOUNTER — Encounter (HOSPITAL_BASED_OUTPATIENT_CLINIC_OR_DEPARTMENT_OTHER)
Admission: RE | Admit: 2010-12-19 | Discharge: 2010-12-19 | Disposition: A | Discharge status: Home / Self Care | Payer: Medicare Other | Source: Ambulatory Visit | Attending: Orthopedic Surgery | Admitting: Orthopedic Surgery

## 2010-12-19 LAB — BASIC METABOLIC PANEL
BUN: 11 mg/dL (ref 6–23)
GFR calc Af Amer: >60 mL/min (ref >60)
GFR calc non Af Amer: >60 mL/min (ref >60)
Potassium: 3.9 mEq/L (ref 3.5–5.1)
Sodium: 138 mEq/L (ref 135–145)

## 2010-12-20 ENCOUNTER — Ambulatory Visit (HOSPITAL_BASED_OUTPATIENT_CLINIC_OR_DEPARTMENT_OTHER)
Admission: RE | Admit: 2010-12-20 | Discharge: 2010-12-20 | Disposition: A | Discharge status: Home / Self Care | Payer: Medicare Other | Source: Ambulatory Visit | Attending: Orthopedic Surgery | Admitting: Orthopedic Surgery

## 2010-12-20 DIAGNOSIS — G5600 Carpal tunnel syndrome, unspecified upper limb: Secondary | ICD-10-CM | POA: Insufficient documentation

## 2010-12-20 DIAGNOSIS — Z01812 Encounter for preprocedural laboratory examination: Secondary | ICD-10-CM | POA: Insufficient documentation

## 2010-12-20 DIAGNOSIS — Z01810 Encounter for preprocedural cardiovascular examination: Secondary | ICD-10-CM | POA: Insufficient documentation

## 2010-12-20 DIAGNOSIS — J45909 Unspecified asthma, uncomplicated: Secondary | ICD-10-CM | POA: Insufficient documentation

## 2010-12-20 DIAGNOSIS — G4733 Obstructive sleep apnea (adult) (pediatric): Secondary | ICD-10-CM | POA: Insufficient documentation

## 2010-12-20 DIAGNOSIS — E669 Obesity, unspecified: Secondary | ICD-10-CM | POA: Insufficient documentation

## 2010-12-20 DIAGNOSIS — I10 Essential (primary) hypertension: Secondary | ICD-10-CM | POA: Insufficient documentation

## 2011-02-18 LAB — CBC
Hemoglobin: 10.8 — ABNORMAL LOW
Hemoglobin: 8.6 — ABNORMAL LOW
MCHC: 34.9
Platelets: 209
RBC: 2.77 — ABNORMAL LOW
RDW: 14.5

## 2011-02-18 LAB — BASIC METABOLIC PANEL
Calcium: 9.2
GFR calc non Af Amer: >60
Glucose, Bld: 113 — ABNORMAL HIGH
Sodium: 137

## 2011-02-25 NOTE — Op Note (Signed)
  [name], [name]                  ACCOUNT NO.:  [id number]  MEDICAL RECORD NO.:  [id number]  LOCATION:                                 FACILITY:  PHYSICIAN:  Cindee Salt, M.D.            DATE OF BIRTH:  DATE OF PROCEDURE:  12/20/2010 DATE OF DISCHARGE:                              OPERATIVE REPORT   PREOPERATIVE DIAGNOSIS:  Carpal tunnel syndrome, right hand.  POSTOPERATIVE DIAGNOSIS:  Carpal tunnel syndrome, right hand.  OPERATION:  Release right carpal canal.  SURGEON:  Cindee Salt, MD  ANESTHESIA:  General local.  ANESTHESIOLOGIST:  Dr. Jean Rosenthal.  HISTORY:  The patient is a 60 year old female with a history of carpal tunnel syndrome, EMG nerve conduction is positive which has not responded to conservative treatment.  She has elected to undergo surgical decompression and repair.  Postoperative course has been discussed along with risks and complications.  She is aware there is no guarantee with surgery, possibility of infection, recurrence of injury to arteries, nerves, tendons, incomplete relief of symptoms, dystrophy. In preoperative area, the patient is seen.  The extremity marked by both the patient and surgeon.  Antibiotic given.  DESCRIPTION OF PROCEDURE:  The patient was brought to the operating room.  A general anesthetic was carried out, 3-minute dry time was allowed after prep with DuraPrep.  The limb was exsanguinated with an Esmarch bandage, tourniquet placed high on the arm was inflated to 250 mmHg.  A straight incision was made longitudinally in palm carried down through subcutaneous tissue.  Bleeders were electrocauterized with bipolar.  Retractors placed.  Palmar fascia was split, superficial palmar arch identified.  Flexor tendon to the ring little finger identified to the ulnar side of median nerve.  Carpal retinaculum was incised with sharp dissection, right angle and Sewall retractor were placed between skin and forearm fascia.  The fascia released  for approximately a centimeter and half proximal to the wrist crease under direct vision.  Canal was explored.  Area compression to the nerve was immediately apparent.  No further lesions were identified.  The wound was irrigated.  The skin closed interrupted with 5-0 Vicryl Rapide sutures.  A local infiltration with 0.25% Marcaine without epinephrine was given.  A sterile compressive dressing applied.  On deflation of the tourniquet, all fingers immediately pinked.  He was taken to the recovery room for observation in satisfactory condition.  She will be discharged home to return to [facility] [place] in 1 week on Vicodin.          ______________________________ Cindee Salt, M.D.     GK/MEDQ  D:  12/20/2010  T:  12/21/2010  Job:  [id number]  Electronically Signed by Cindee Salt M.D. on 02/25/2011 04:36:45 PM

## 2011-03-04 ENCOUNTER — Other Ambulatory Visit: Payer: Self-pay | Admitting: Neurosurgery

## 2011-03-04 DIAGNOSIS — D497 Neoplasm of unspecified behavior of endocrine glands and other parts of nervous system: Secondary | ICD-10-CM

## 2011-03-31 ENCOUNTER — Ambulatory Visit
Admission: RE | Admit: 2011-03-31 | Discharge: 2011-03-31 | Disposition: A | Discharge status: Home / Self Care | Payer: Medicare Other | Source: Ambulatory Visit | Attending: Neurosurgery | Admitting: Neurosurgery

## 2011-03-31 DIAGNOSIS — D497 Neoplasm of unspecified behavior of endocrine glands and other parts of nervous system: Secondary | ICD-10-CM

## 2011-03-31 MED ORDER — GADOBENATE DIMEGLUMINE 529 MG/ML IV SOLN
10.0000 mL | Freq: Once | INTRAVENOUS | Status: AC | PRN
  Administered 2011-03-31: 10 mL via INTRAVENOUS

## 2011-04-01 ENCOUNTER — Other Ambulatory Visit (HOSPITAL_COMMUNITY): Payer: Self-pay | Admitting: Diagnostic Radiology

## 2011-05-27 DIAGNOSIS — C50919 Malignant neoplasm of unspecified site of unspecified female breast: Secondary | ICD-10-CM

## 2011-05-27 HISTORY — DX: Malignant neoplasm of unspecified site of unspecified female breast: C50.919

## 2011-06-02 ENCOUNTER — Other Ambulatory Visit: Payer: Self-pay | Admitting: Obstetrics and Gynecology

## 2011-06-02 DIAGNOSIS — Z1231 Encounter for screening mammogram for malignant neoplasm of breast: Secondary | ICD-10-CM

## 2011-06-09 ENCOUNTER — Encounter: Payer: Self-pay | Admitting: Cardiovascular Disease

## 2011-06-11 ENCOUNTER — Encounter: Payer: Self-pay | Admitting: *Deleted

## 2011-06-17 ENCOUNTER — Ambulatory Visit (INDEPENDENT_AMBULATORY_CARE_PROVIDER_SITE_OTHER): Payer: Medicare Other | Admitting: Cardiovascular Disease

## 2011-06-17 ENCOUNTER — Encounter: Payer: Self-pay | Admitting: Cardiovascular Disease

## 2011-06-17 VITALS — BP 152/84 | HR 84 | Ht 62.0 in | Wt 293.0 lb

## 2011-06-17 DIAGNOSIS — E782 Mixed hyperlipidemia: Secondary | ICD-10-CM | POA: Insufficient documentation

## 2011-06-17 DIAGNOSIS — R011 Cardiac murmur, unspecified: Secondary | ICD-10-CM

## 2011-06-17 DIAGNOSIS — G4733 Obstructive sleep apnea (adult) (pediatric): Secondary | ICD-10-CM

## 2011-06-17 DIAGNOSIS — I10 Essential (primary) hypertension: Secondary | ICD-10-CM

## 2011-06-17 NOTE — Assessment & Plan Note (Signed)
Well controlled.  Continue current medications and low sodium Dash type diet.    

## 2011-06-17 NOTE — Assessment & Plan Note (Signed)
Continue statin  Labs with primary 

## 2011-06-17 NOTE — Assessment & Plan Note (Signed)
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  F/U echo

## 2011-06-17 NOTE — Patient Instructions (Signed)
Your physician has requested that you have an echocardiogram. Echocardiography is a painless test that uses sound waves to create images of your heart. It provides your doctor with information about the size and shape of your heart and how well your heart's chambers and valves are working. This procedure takes approximately one hour. There are no restrictions for this procedure.  Your physician recommends that you schedule a follow-up appointment with Dr. Nishan as needed.   

## 2011-06-17 NOTE — Assessment & Plan Note (Signed)
Stable continue CPAP  F/U Clance

## 2011-06-17 NOTE — Progress Notes (Signed)
61 yo referred by primary at [facility] for murmur.  Long standing history of such.  Echo ? 2000 "ok".  She is obese.  Goes to [employer] 3/week and has been as heavy as 346 so is doing better.  Sleep apnea with CPAP last 4 years.  Compliant  See Dr Shelle Iron.  Mild chronic exertional dyspnea and dependant edema  Asthma well controlled.  No palpitatoins, fevers, PND, orthopnea.  Compliant with meds.    ROS: Denies fever, malais, weight loss, blurry vision, decreased visual acuity, cough, sputum, SOB, hemoptysis, pleuritic pain, palpitaitons, heartburn, abdominal pain, melena, lower extremity edema, claudication, or rash.  All other systems reviewed and negative   General: Affect appropriate Healthy:  appears stated age HEENT: normal Neck supple with no adenopathy JVP normal no bruits no thyromegaly Lungs clear with no wheezing and good diaphragmatic motion Heart:  S1/S2 no murmur,rub, gallop or click PMI normal Abdomen: benighn, BS positve, no tenderness, no AAA no bruit.  No HSM or HJR Distal pulses intact with no bruits No edema Neuro non-focal Skin warm and dry No muscular weakness  Medications Current Outpatient Prescriptions  Medication Sig Dispense Refill  . albuterol (PROAIR HFA) 108 (90 BASE) MCG/ACT inhaler Inhale 2 puffs into the lungs every 6 (six) hours as needed.        . cabergoline (DOSTINEX) 0.5 MG tablet Take 0.25 mg by mouth 3 (three) times a week.       . Fluticasone-Salmeterol (ADVAIR DISKUS) 100-50 MCG/DOSE AEPB Inhale 1 puff into the lungs 2 (two) times daily.        . furosemide (LASIX) 20 MG tablet Take 20 mg by mouth 2 (two) times daily.        Marland Kitchen latanoprost (XALATAN) 0.005 % ophthalmic solution 1 drop at bedtime.      . simvastatin (ZOCOR) 20 MG tablet Take 20 mg by mouth at bedtime.        . valsartan-hydrochlorothiazide (DIOVAN-HCT) 160-25 MG per tablet Take 1 tablet by mouth daily.          Allergies Trandolapril-verapamil hcl  Family History: No family  history on file.  Social History: History   Social History  . Marital Status: Married    Spouse Name: N/A    Number of Children: N/A  . Years of Education: N/A   Occupational History  . Not on file.   Social History Main Topics  . Smoking status: Never Smoker   . Smokeless tobacco: Not on file  . Alcohol Use: Not on file  . Drug Use: Not on file  . Sexually Active: Not on file   Other Topics Concern  . Not on file   Social History Narrative  . No narrative on file    Electrocardiogram:12/19/2010  [facility] NSR rate 71 normal ECG  Assessment and Plan

## 2011-06-20 ENCOUNTER — Ambulatory Visit: Payer: Medicare Other

## 2011-06-24 ENCOUNTER — Ambulatory Visit (HOSPITAL_COMMUNITY): Payer: Medicare Other | Attending: Cardiology | Admitting: Radiology

## 2011-06-24 DIAGNOSIS — I10 Essential (primary) hypertension: Secondary | ICD-10-CM | POA: Insufficient documentation

## 2011-06-24 DIAGNOSIS — E785 Hyperlipidemia, unspecified: Secondary | ICD-10-CM | POA: Insufficient documentation

## 2011-06-24 DIAGNOSIS — R011 Cardiac murmur, unspecified: Secondary | ICD-10-CM

## 2011-06-24 DIAGNOSIS — G4733 Obstructive sleep apnea (adult) (pediatric): Secondary | ICD-10-CM | POA: Insufficient documentation

## 2011-06-24 DIAGNOSIS — J45909 Unspecified asthma, uncomplicated: Secondary | ICD-10-CM | POA: Insufficient documentation

## 2011-06-27 ENCOUNTER — Telehealth: Payer: Self-pay | Admitting: Cardiovascular Disease

## 2011-06-27 NOTE — Telephone Encounter (Signed)
PT AWARE OF ECHO RESULTS./CY 

## 2011-06-27 NOTE — Telephone Encounter (Signed)
Fu call °Patient returning your call °

## 2011-07-03 ENCOUNTER — Ambulatory Visit
Admission: RE | Admit: 2011-07-03 | Discharge: 2011-07-03 | Disposition: A | Discharge status: Home / Self Care | Payer: Medicare Other | Source: Ambulatory Visit | Attending: Obstetrics and Gynecology | Admitting: Obstetrics and Gynecology

## 2011-07-03 DIAGNOSIS — Z1231 Encounter for screening mammogram for malignant neoplasm of breast: Secondary | ICD-10-CM

## 2011-07-09 ENCOUNTER — Other Ambulatory Visit: Payer: Self-pay | Admitting: Obstetrics and Gynecology

## 2011-07-09 DIAGNOSIS — R928 Other abnormal and inconclusive findings on diagnostic imaging of breast: Secondary | ICD-10-CM

## 2011-07-16 ENCOUNTER — Ambulatory Visit
Admission: RE | Admit: 2011-07-16 | Discharge: 2011-07-16 | Disposition: A | Discharge status: Home / Self Care | Payer: Medicare Other | Source: Ambulatory Visit | Attending: Obstetrics and Gynecology | Admitting: Obstetrics and Gynecology

## 2011-07-16 ENCOUNTER — Other Ambulatory Visit: Payer: Self-pay | Admitting: Obstetrics and Gynecology

## 2011-07-16 DIAGNOSIS — R928 Other abnormal and inconclusive findings on diagnostic imaging of breast: Secondary | ICD-10-CM

## 2011-07-21 ENCOUNTER — Other Ambulatory Visit: Payer: Self-pay | Admitting: Obstetrics and Gynecology

## 2011-07-21 ENCOUNTER — Ambulatory Visit
Admission: RE | Admit: 2011-07-21 | Discharge: 2011-07-21 | Disposition: A | Discharge status: Home / Self Care | Payer: Medicare Other | Source: Ambulatory Visit | Attending: Obstetrics and Gynecology | Admitting: Obstetrics and Gynecology

## 2011-07-21 DIAGNOSIS — N6310 Unspecified lump in the right breast, unspecified quadrant: Secondary | ICD-10-CM

## 2011-07-21 DIAGNOSIS — R928 Other abnormal and inconclusive findings on diagnostic imaging of breast: Secondary | ICD-10-CM

## 2011-07-22 ENCOUNTER — Ambulatory Visit
Admission: RE | Admit: 2011-07-22 | Discharge: 2011-07-22 | Disposition: A | Discharge status: Home / Self Care | Payer: Medicare Other | Source: Ambulatory Visit | Attending: Obstetrics and Gynecology | Admitting: Obstetrics and Gynecology

## 2011-07-22 ENCOUNTER — Other Ambulatory Visit: Payer: Self-pay | Admitting: Obstetrics and Gynecology

## 2011-07-22 DIAGNOSIS — C50911 Malignant neoplasm of unspecified site of right female breast: Secondary | ICD-10-CM

## 2011-07-22 DIAGNOSIS — N6310 Unspecified lump in the right breast, unspecified quadrant: Secondary | ICD-10-CM

## 2011-07-24 ENCOUNTER — Telehealth: Payer: Self-pay | Admitting: *Deleted

## 2011-07-24 ENCOUNTER — Other Ambulatory Visit: Payer: Self-pay | Admitting: *Deleted

## 2011-07-24 DIAGNOSIS — C50311 Malignant neoplasm of lower-inner quadrant of right female breast: Secondary | ICD-10-CM | POA: Insufficient documentation

## 2011-07-24 DIAGNOSIS — C50319 Malignant neoplasm of lower-inner quadrant of unspecified female breast: Secondary | ICD-10-CM

## 2011-07-24 HISTORY — DX: Malignant neoplasm of lower-inner quadrant of unspecified female breast: C50.319

## 2011-07-24 NOTE — Telephone Encounter (Signed)
Confirmed [facility] for 07/30/11 at 0800 .  Instructions and contact information given.  

## 2011-07-29 ENCOUNTER — Ambulatory Visit
Admission: RE | Admit: 2011-07-29 | Discharge: 2011-07-29 | Disposition: A | Discharge status: Home / Self Care | Payer: Medicare Other | Source: Ambulatory Visit | Attending: Obstetrics and Gynecology | Admitting: Obstetrics and Gynecology

## 2011-07-29 DIAGNOSIS — C50911 Malignant neoplasm of unspecified site of right female breast: Secondary | ICD-10-CM

## 2011-07-29 MED ORDER — GADOBENATE DIMEGLUMINE 529 MG/ML IV SOLN
20.0000 mL | Freq: Once | INTRAVENOUS | Status: AC | PRN
  Administered 2011-07-29: 20 mL via INTRAVENOUS

## 2011-07-30 ENCOUNTER — Encounter: Payer: Self-pay | Admitting: Oncology

## 2011-07-30 ENCOUNTER — Encounter (INDEPENDENT_AMBULATORY_CARE_PROVIDER_SITE_OTHER): Payer: Self-pay | Admitting: Surgery

## 2011-07-30 ENCOUNTER — Ambulatory Visit: Payer: Medicare Other | Attending: Surgery | Admitting: Physical Therapy

## 2011-07-30 ENCOUNTER — Encounter: Payer: Self-pay | Admitting: Specialist

## 2011-07-30 ENCOUNTER — Ambulatory Visit: Payer: Medicare Other

## 2011-07-30 ENCOUNTER — Other Ambulatory Visit (HOSPITAL_BASED_OUTPATIENT_CLINIC_OR_DEPARTMENT_OTHER): Payer: Medicare Other | Admitting: Lab

## 2011-07-30 ENCOUNTER — Ambulatory Visit (HOSPITAL_BASED_OUTPATIENT_CLINIC_OR_DEPARTMENT_OTHER): Payer: Medicare Other | Admitting: Surgery

## 2011-07-30 ENCOUNTER — Ambulatory Visit
Admission: RE | Admit: 2011-07-30 | Discharge: 2011-07-30 | Disposition: A | Discharge status: Home / Self Care | Payer: Medicare Other | Source: Ambulatory Visit | Attending: Radiation Oncology | Admitting: Radiation Oncology

## 2011-07-30 ENCOUNTER — Ambulatory Visit (HOSPITAL_BASED_OUTPATIENT_CLINIC_OR_DEPARTMENT_OTHER): Payer: Medicare Other | Admitting: Oncology

## 2011-07-30 VITALS — BP 143/84 | HR 73 | Temp 98.4°F | Wt 285.0 lb

## 2011-07-30 VITALS — BP 143/84 | HR 73 | Temp 98.4°F | Ht 61.5 in | Wt 285.2 lb

## 2011-07-30 DIAGNOSIS — C50319 Malignant neoplasm of lower-inner quadrant of unspecified female breast: Secondary | ICD-10-CM

## 2011-07-30 DIAGNOSIS — C50919 Malignant neoplasm of unspecified site of unspecified female breast: Secondary | ICD-10-CM | POA: Insufficient documentation

## 2011-07-30 DIAGNOSIS — IMO0001 Reserved for inherently not codable concepts without codable children: Secondary | ICD-10-CM | POA: Insufficient documentation

## 2011-07-30 DIAGNOSIS — R293 Abnormal posture: Secondary | ICD-10-CM | POA: Insufficient documentation

## 2011-07-30 LAB — CBC WITH DIFFERENTIAL/PLATELET
BASO%: 0.3 % (ref 0.0–2.0)
EOS%: 1.5 % (ref 0.0–7.0)
HCT: 36.7 % (ref 34.8–46.6)
LYMPH%: 41.5 % (ref 14.0–49.7)
MCH: 29.5 pg (ref 25.1–34.0)
MCHC: 33.1 g/dL (ref 31.5–36.0)
MONO%: 5.8 % (ref 0.0–14.0)
NEUT%: 50.9 % (ref 38.4–76.8)
Platelets: 200 10*3/uL (ref 145–400)
RBC: 4.11 10*6/uL (ref 3.70–5.45)

## 2011-07-30 LAB — COMPREHENSIVE METABOLIC PANEL
ALT: 7 U/L (ref 0–35)
AST: 12 U/L (ref 0–37)
Alkaline Phosphatase: 76 U/L (ref 39–117)
CO2: 31 mEq/L (ref 19–32)
Creatinine, Ser: 0.9 mg/dL (ref 0.50–1.10)
Sodium: 137 mEq/L (ref 135–145)
Total Bilirubin: 1 mg/dL (ref 0.3–1.2)

## 2011-07-30 NOTE — Progress Notes (Signed)
Patient ID: [name], female   DOB: 07-Sep-1950, 61 y.o.   MRN: [id number] Note from Dr Eden Emms and Dr Shelle Iron - no further eval needed pre-op from their standpoint

## 2011-07-30 NOTE — Progress Notes (Signed)
Patient ID: [name], female   DOB: 01/06/1951, 60 y.o.   MRN: [id number]  Chief Complaint  Patient presents with  . Breast Cancer    HPI [name] is a 60 y.o. female.  She had a recent mammogram and a right breast abnormality was noted. There appeared to be 3 separate areas in a radially oriented fashion in the medial aspect of the breast. Biopsies were done and has shown invasive ductal carcinoma. They are receptor positive, HER-2 negative, with a Ki-67 of 53%. She is asymptomatic. She has not felt a mass. She notes that about 4 years ago she had some nipple discharge and apparently was thought to have some pituitary abnormality. This resolved and she has not had any further symptoms from the breast. HPI  Past Medical History  Diagnosis Date  . Hypertension   . OSA (obstructive sleep apnea)   . Asthma   . Cancer of lower-inner quadrant of female breast 07/24/2011    Past Surgical History  Procedure Date  . Tubal ligation   . Dilation and curettage of uterus     x3  . Carpal tunnel release   . Hand surgery     2012  . Cholecystectomy 1987    open - Dr Arkin    Family History  Problem Relation Age of Onset  . Cancer Father     Social History History  Substance Use Topics  . Smoking status: Never Smoker   . Smokeless tobacco: Not on file  . Alcohol Use: No    Allergies  Allergen Reactions  . Trandolapril-Verapamil Hcl     REACTION: lip swelling    Current Outpatient Prescriptions  Medication Sig Dispense Refill  . albuterol (PROAIR HFA) 108 (90 BASE) MCG/ACT inhaler Inhale 2 puffs into the lungs every 6 (six) hours as needed.        . cabergoline (DOSTINEX) 0.5 MG tablet Take 0.25 mg by mouth 3 (three) times a week.       . Fluticasone-Salmeterol (ADVAIR DISKUS) 100-50 MCG/DOSE AEPB Inhale 1 puff into the lungs 2 (two) times daily.        . furosemide (LASIX) 20 MG tablet Take 20 mg by mouth 2 (two) times daily.        . latanoprost (XALATAN) 0.005 %  ophthalmic solution 1 drop at bedtime.      . simvastatin (ZOCOR) 20 MG tablet Take 20 mg by mouth at bedtime.        . valsartan-hydrochlorothiazide (DIOVAN-HCT) 160-25 MG per tablet Take 1 tablet by mouth daily.          Review of Systems Review of Systems  Constitutional: Negative for fever, chills and unexpected weight change.  HENT: Negative for hearing loss, congestion, sore throat, trouble swallowing and voice change.   Eyes: Negative for visual disturbance.  Respiratory: Negative for cough and wheezing.   Cardiovascular: Negative for chest pain, palpitations and leg swelling.  Gastrointestinal: Negative for nausea, vomiting, abdominal pain, diarrhea, constipation, blood in stool, abdominal distention and anal bleeding.  Genitourinary: Negative for hematuria, vaginal bleeding and difficulty urinating.  Musculoskeletal: Negative for arthralgias.  Skin: Negative for rash and wound.  Neurological: Negative for seizures, syncope and headaches.  Hematological: Negative for adenopathy. Does not bruise/bleed easily.  Psychiatric/Behavioral: Negative for confusion.    Wt Readings from Last 3 Encounters:  07/30/11 285 lb 3.2 oz (129.366 kg)  06/17/11 293 lb (132.904 kg)  11/21/10 293 lb 12.8 oz (133.267 kg)   Temp   Readings from Last 3 Encounters:  07/30/11 98.4 F (36.9 C) Oral  11/21/10 98.2 F (36.8 C) Oral   BP Readings from Last 3 Encounters:  07/30/11 143/84  06/17/11 152/84  11/21/10 122/70   Pulse Readings from Last 3 Encounters:  07/30/11 73  06/17/11 84  11/21/10 82     Physical Exam Physical Exam  Vitals reviewed. Constitutional: She is oriented to person, place, and time. She appears well-developed and well-nourished. No distress.  HENT:  Head: Normocephalic and atraumatic.  Mouth/Throat: Oropharynx is clear and moist.  Eyes: Conjunctivae and EOM are normal. Pupils are equal, round, and reactive to light. No scleral icterus.  Neck: Normal range of motion.  Neck supple. No tracheal deviation present. No thyromegaly present.  Cardiovascular: Normal rate, regular rhythm, normal heart sounds and intact distal pulses.  Exam reveals no gallop and no friction rub.   No murmur heard. Pulmonary/Chest: Effort normal and breath sounds normal. No respiratory distress. She has no wheezes. She has no rales.         Ecchymosis as noted  Abdominal: Soft. Bowel sounds are normal. She exhibits no distension and no mass. There is no tenderness. There is no rebound and no guarding.  Musculoskeletal: Normal range of motion. She exhibits no edema and no tenderness.  Neurological: She is alert and oriented to person, place, and time.  Skin: Skin is warm and dry. No rash noted. She is not diaphoretic. No erythema.  Psychiatric: She has a normal mood and affect. Her behavior is normal. Judgment and thought content normal.    Data Reviewed I have reviewed the mammograms and the MRI films, port, and discussed them with the radiologist. I have reviewed the pathology reports and slides and discuss them with the pathologist.  Assessment    Right breast cancer, possible clinical stage III based on size.    Plan    Surgically I think she'll need to have a mastectomy with sentinel node evaluation. At our multidisciplinary conference a preoperative PET scan was recommended. If that is negative then she may or may not need radiation and she may or may not need chemotherapy.  I have explained the pathophysiology and staging of breast cancer with particular attention to her exact situation. We discussed the multidisciplinary approach to breast cancer which often includes both medical and radiation oncology consultations.Those will be done today  We also discussed surgical options for the treatment of breast cancer including lumpectomy and mastectomy with possible reconstructive surgery. In addition we talked about the evaluation and management of lymph nodes including a  description of sentinel lymph node biopsy and axillary dissections. We reviewed potential complications and risks including bleeding, infection, numbness,  lymphedema, and the potential need for additional surgery.  She understands that for patients who are candidate for lumpectomy or mastectomy there is an equal survival rate with either technique, but a slightly higher local recurrence rate with lumpectomy. In addition she knows that a lumpectomy usually requires postoperative radiation as part of the management of the breast cancer.  We have discussed the likely postoperative course and plans for followup.  I have given the patient some written information that reviewed all of these issues. I believe her questions are answered and that she has a good understanding of the issues.        Bradlee Heitman J 07/30/2011, 9:49 AM    

## 2011-07-30 NOTE — Patient Instructions (Signed)
We will schedule surgery for a right mastectomy with removal of the sentinel lymph nodes as we discussed in the office. We will wait until after the PET scan has been done to do the surgery so that we can be sure that there are no changes in plans based on the PET scan. If you have any questions please call me at the office.

## 2011-07-30 NOTE — Progress Notes (Signed)
[facility] Radiation Oncology NEW PATIENT EVALUATION  Name: [name] MRN: [id number]  Date: 07/30/2011  DOB: Jan 17, 1951  Status: outpatient   CC: Delorse Lek, MD, MD  Currie Paris, MD , Dr. 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 female who is seen today at [facility] 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 A screening mammogram in January of 2012 showed a "possible" mass within the right breast. Followup views on 05/31/2010 showed a stable right breast mammogram with no evidence of malignancy. [facility]2011 showed a possible mass within the right breast. Additional views and ultrasound on 07/16/2011 showed a suspicious right breast mass at 4:00 which on ultrasound measured 1.2 x 1.0 x 0.9 cm. There was no axillary adenopathy seen on ultrasound. Additional views on 07/01/2011 showed 3 masses at 5:00 in the right breast, T2, 4, and 8 cm from the right nipple. On review of her pathology all masses were similar, representing invasive ductal carcinoma with DCIS. These were all felt to represent the same primary. The carcinoma was strongly ER/PR positive with an elevated proliferation marker/Ki-67 of 53%. HER-2/neu was not amplified. Breast MRI on 07/29/2011 showed extensive non-masslike clump linear enhancement throughout the central inferior portions of the right breast measuring 9.1 x 7.1 x 2.5 cm in size consistent with patient's known invasive and in situ mammary carcinoma. A 1.5 x 1.0 cm lymph node was located lateral to the right pectoralis, worrisome for a possible metastatic lymph node.  PREVIOUS RADIATION THERAPY: No   PAST MEDICAL HISTORY:  has a past medical history of Hypertension; OSA (obstructive  sleep apnea); Asthma; and Cancer of lower-inner quadrant of female breast (07/24/2011).     PAST SURGICAL HISTORY:  Past Surgical History  Procedure Date  . Tubal ligation   . Dilation and curettage of uterus     x3  . Carpal tunnel release   . Hand surgery     2012  . Cholecystectomy 1987    open - Dr Wiliam Ke     FAMILY HISTORY: family history includes Cancer in her father.Her father died from prostate cancer at age 5. Her mother is alive at age 71 is doing well with the exception of arthritis.   SOCIAL HISTORY:  reports that she has never smoked. She does not have any smokeless tobacco history on file. She reports that she does not drink alcohol or use illicit drugs.Married with 2 children. She worked as a Neurosurgeon.   ALLERGIES: Trandolapril-verapamil hcl   MEDICATIONS:  Current Outpatient Prescriptions  Medication Sig Dispense Refill  . albuterol (PROAIR HFA) 108 (90 BASE) MCG/ACT inhaler Inhale 2 puffs into the lungs every 6 (six) hours as needed.        . cabergoline (DOSTINEX) 0.5 MG tablet Take 0.25 mg by mouth 3 (three) times a week.       . Fluticasone-Salmeterol (ADVAIR DISKUS) 100-50 MCG/DOSE AEPB Inhale 1 puff into the lungs 2 (two) times daily.        . furosemide (LASIX) 20 MG tablet Take 20 mg by mouth 2 (two) times daily.        Marland Kitchen latanoprost (XALATAN) 0.005 % ophthalmic solution 1 drop at bedtime.      Marland Kitchen  simvastatin (ZOCOR) 20 MG tablet Take 20 mg by mouth at bedtime.        . valsartan-hydrochlorothiazide (DIOVAN-HCT) 160-25 MG per tablet Take 1 tablet by mouth daily.            REVIEW OF SYSTEMS:  Pertinent items are noted in HPI.    PHYSICAL EXAM:  Alert and oriented 61 year old white female appearing her stated age. Vital signs: BP 143/84, pulse 73, respiratory rate 20, temperature 98.4  Head and neck examination: Grossly unremarkable. Nodes: Without palpable cervical, supraclavicular, or axillary lymphadenopathy. Chest: Lungs clear. Back: Without  spinal or CVA tenderness. Heart: Regular rate and rhythm. Breasts: There is an area ecchymosis along the medial aspect of the right breast at approximately 3-4:00. I do not appreciate a discrete mass although there is slight induration deep to the area of ecchymosis. Left breast without masses or lesions. Abdomen: Without masses or organomegaly. Extremities: Without edema.    LABORATORY DATA:  Lab Results  Component Value Date   WBC 6.2 07/30/2011   HGB 12.1 07/30/2011   HCT 36.7 07/30/2011   MCV 89.3 07/30/2011   PLT 200 07/30/2011   Lab Results  Component Value Date   NA 137 07/30/2011   K 3.6 07/30/2011   CL 100 07/30/2011   CO2 31 07/30/2011   Lab Results  Component Value Date   ALT 7 07/30/2011   AST 12 07/30/2011   ALKPHOS 76 07/30/2011   BILITOT 1.0 07/30/2011      IMPRESSION: Clinical stage IIB (T3, N0, M0) invasive ductal carcinoma the right breast. In view of the extent of her disease measuring over 9 cm on MRI, I feel that you the best served by mastectomy and sentinel lymph node biopsy. She may be a candidate for post mastectomy radiation therapy depending on her mastectomy findings in view of her clinical T3 disease. If she is interested in reconstruction she should have a "delayed reconstruction". I understand that she'll be scheduled for a PET scan to complete her staging workup prior to definitive surgery. Dr., will discuss with her the possibility adjuvant chemotherapy/hormone therapy.   PLAN: as discussed above. She'll have a PET scan to complete her staging workup and then proceed with a right mastectomy/sentinel lymph node biopsy alone possible adjuvant chemotherapy/radiation therapy. I will see the patient 2 weeks after her definitive surgery.   I spent 40 minutes minutes face to face with the patient and more than 50% of that time was spent in counseling and/or coordination of care.

## 2011-07-30 NOTE — Progress Notes (Signed)
Correction to previous note:  Patient did request a referral be made to [facility], and she did have an interest in attending the Breast Cancer Support Group.

## 2011-07-30 NOTE — Patient Instructions (Signed)
1. You will see me back in 1 month after your surgery.

## 2011-07-30 NOTE — Progress Notes (Signed)
I met the patient and her husband at the multidisciplinary clinic and reviewed with her the support services available at the [facility].  She indicated she relies primarily on her faith for support and did not wish, at this time, a [facility] volunteer to to attend Breast Cancer Support Group.  I gave her my contact information, should she need any additional support, as well as a copy of the support calendar.

## 2011-08-04 ENCOUNTER — Telehealth: Payer: Self-pay | Admitting: *Deleted

## 2011-08-04 ENCOUNTER — Telehealth: Payer: Self-pay | Admitting: Oncology

## 2011-08-04 NOTE — Telephone Encounter (Signed)
S/w the pt over the phone and she is aware of her April 2013 appt.

## 2011-08-04 NOTE — Telephone Encounter (Signed)
Left vm for pt to return call regarding [facility] from 07/30/11. 

## 2011-08-05 NOTE — Progress Notes (Signed)
[name] [id number] Jun 09, 1950 61 y.o. 08/05/2011 2:43 PM  CC  Dr. Cyndia Bent Dr. Henriette Combs Kathryn Lek, MD, MD 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   STAGE:   Cancer of lower-inner quadrant of female breast   Primary site: Breast (Right)   Staging method: AJCC 7th Edition   Clinical: Stage IIB (T3, N0, cM0)   Summary: Stage IIB (T3, N0, cM0)  REFERRING PHYSICIAN: Dr. Cyndia Bent  HISTORY OF PRESENT ILLNESS:  [name] is [name] 61 y.o. female.  Medical history significant for hypertension asthma and obstructive sleep apnea. She was recently seen for [name] screening mammogram in January 2012 that showed [name] mass possibly within the right breast. She will was advised to have [name] followup mammogram. This was recently done in January 2013 that showed possible abnormality again with 3 separate areas. She went on to have [name] biopsy of the masses performed on 08/14/2011. All 3 biopsies showed invasive ductal carcinoma that were ER positive PR positive proliferation marker 53% and HER-2/neu negative. She subsequently had MRI of the breasts performed and the MRI showed extensive clumped linear non-mass enhancement present within the central and inferior portions of the right breast extending to the nipple the total area measured 9.1 x 7.1 x 2.7 cm. Patient is is now referred to the multidisciplinary breast clinic for discussion of treatment options. She is without any complaints.  Past Medical History: Past Medical History  Diagnosis Date  . Hypertension   . OSA (obstructive sleep apnea)   . Asthma   . Cancer of lower-inner quadrant of female breast 07/24/2011    Past Surgical History: Past Surgical History  Procedure Date  . Tubal ligation   . Dilation and curettage of  uterus     x3  . Carpal tunnel release   . Hand surgery     2012  . Cholecystectomy 1987    open - Dr Wiliam Ke    Family History: Family History  Problem Relation Age of Onset  . Cancer Father     Social History History  Substance Use Topics  . Smoking status: Never Smoker   . Smokeless tobacco: Not on file  . Alcohol Use: No    Allergies: Allergies  Allergen Reactions  . Trandolapril-Verapamil Hcl     REACTION: lip swelling    Current Medications: Current Outpatient Prescriptions  Medication Sig Dispense Refill  . albuterol (PROAIR HFA) 108 (90 BASE) MCG/ACT inhaler Inhale 2 puffs into the lungs every 6 (six) hours as needed.        . cabergoline (DOSTINEX) 0.5 MG tablet Take 0.25 mg by mouth 3 (three) times [name] week.       . Fluticasone-Salmeterol (ADVAIR DISKUS) 100-50 MCG/DOSE AEPB Inhale 1 puff into the lungs 2 (two) times daily.        . furosemide (LASIX) 20 MG tablet Take 20 mg by mouth 2 (two) times daily.        Marland Kitchen latanoprost (XALATAN) 0.005 % ophthalmic solution 1 drop at bedtime.      . simvastatin (ZOCOR) 20 MG tablet Take 20 mg by mouth at bedtime.        . valsartan-hydrochlorothiazide (DIOVAN-HCT) 160-25 MG per tablet Take 1 tablet by mouth daily.          OB/GYN History:Patient has 2 children  43 and 16 she had her menarche at age 69 menopause at 85 she had her first birth at 76.  Fertility Discussion:Not applicable Prior History of Cancer:Prior history of cancers  Health Maintenance:  ColonoscopyNovember 2010 Bone DensityPatient has never had [name] bone density Last PAP smearAugust 2012  ECOG PERFORMANCE STATUS: 1 - Symptomatic but completely ambulatory  Genetic Counseling/testing:Patient does not meet criteria for genetic counseling and testing per NCCN guidelines  REVIEW OF SYSTEMS:  Ears, nose, mouth, throat, and face: negative Respiratory: positive for asthma Cardiovascular: negative Gastrointestinal: negative Integument/breast: positive for  breast tenderness Hematologic/lymphatic: negative Musculoskeletal:positive for arthralgias Neurological: negative  PHYSICAL EXAMINATION: Blood pressure 143/84, pulse 73, temperature 98.4 F (36.9 C), temperature source Oral, height 5' 1.5" (1.562 m), weight 285 lb 3.2 oz (129.366 kg).  EAV:WUJWJ, healthy, no distress, well nourished, well developed and obese SKIN: skin color, texture, turgor are normal HEAD: No masses, lesions, tenderness or abnormalities EYES: PERRLA, EOMI, Conjunctiva are pink and non-injected, sclera clear EARS: External ears normal OROPHARYNX:no exudate, no erythema and lips, buccal mucosa, and tongue normal  NECK: supple, no adenopathy, no bruits LYMPH:  no palpable lymphadenopathy, no hepatosplenomegaly BREAST:Breast exam shows area of ecchymosis in the right breast on multiple sites. No other masses or nipple discharge. LUNGS: clear to auscultation , and palpation HEART: regular rate & rhythm, no murmurs and no gallops ABDOMEN:abdomen soft, non-tender, normal bowel sounds and no masses or organomegaly BACK: Back symmetric, no curvature. EXTREMITIES:less then 2 second capillary refill  NEURO: alert & oriented x 3 with fluent speech, no focal motor/sensory deficits, gait normal   STUDIES/RESULTS: US Breast Right  07/16/2011  *RADIOLOGY REPORT*  Clinical Data:  Screening callback for questioned right breast mass  DIGITAL DIAGNOSTIC RIGHT MAMMOGRAM WITHOUT CAD AND RIGHT BREAST ULTRASOUND:  Comparison:  Prior exams  Findings:  Additional views confirm the presence of [name] spiculated irregular mass in the right lower inner quadrant at the site of the questioned screening mammographic finding.  On physical exam, I palpate dense tissue in the right lower inner quadrant but no focal mass.  Ultrasound is performed, showing an irregular spiculated hypoechoic shadowing mass in the right breast four o'clock location 5 cm from the nipple measuring 1.2 x 1.0 x 0.9 cm.  This  corresponds to the mammographic finding.  No right axillary lymphadenopathy is identified.  IMPRESSION: Suspicious right breast mass 4 o'clock location.  Ultrasound-guided core biopsy will be scheduled at the patient's convenience. Findings and recommendations discussed with the patient and provided in written form at the time of the exam.  I also called these findings to the office of Dr. Jackelyn Knife at the time of imaging.  BI-RADS CATEGORY 5:  Highly suggestive of malignancy - appropriate action should be taken.  Recommendation:  Ultrasound guided right breast biopsy  Original Report Authenticated By: Harrel Lemon, M.D.   Mr Breast Bilateral W Wo Contrast  07/29/2011  *RADIOLOGY REPORT*  Clinical Data: Recently diagnosed right breast invasive mammary carcinoma.  Preoperative evaluation.  BUN and creatinine were obtained on site at [facility] at [address] Results:  BUN 11 mg/dL,  Creatinine 1.1 mg/dL.  BILATERAL BREAST MRI WITH AND WITHOUT CONTRAST  Technique: Multiplanar, multisequence MR images of both breasts were obtained prior to and following the intravenous administration of 20ml of Multihance.  Three dimensional images were evaluated at the independent DynaCad workstation.  Comparison:  07/21/2011, 07/16/2011, 07/03/2011, 06/20/2010, 06/13/2010, 06/12/2009, 05/29/2008, 05/28/2007 mammograms 07/16/2011 right breast ultrasound.  Findings: There  is moderate background diffuse bilateral parenchymal enhancement.  There is extensive clumped linear non mass enhancement present within the central and inferior portions of the right breast which extends to the nipple.  The area of enhancement measures 9.1 x 7.1 x 2.7 cm in size.  There are three associated signal voids consistent with clip artifacts related to the recent ultrasound guided right breast core biopsies located within the inferior right breast extending from approximately 2.5 cm post to the nipple to approximately 9 cm posterior  to the nipple. There is no worrisome enhancement within the left breast.  There is [name] lymph node located lateral to the right pectoralis muscle with loss of central hilar fat which is worrisome for [name] possible metastatic lymph node.  This measures 1.5 x 1.0 cm in greatest dimension.  There is no evidence for internal mammary adenopathy or left axillary adenopathy.  There are no additional findings.  IMPRESSION: Extensive non mass-like clumped linear enhancement throughout the central inferior portions of the right breast measuring 9.1 x 7.1 x 2.7 cm in size consistent with the patient's known invasive and in situ mammary carcinoma. 2.  1.5 x 1.0 cm lymph node located lateral to the right pectoralis muscle worrisome for [name] possible metastatic lymph node.  No additional findings.  THREE-DIMENSIONAL MR IMAGE RENDERING ON INDEPENDENT WORKSTATION:  Three-dimensional MR images were rendered by post-processing of the original MR data on an independent workstation.  The three- dimensional MR images were interpreted, and findings were reported in the accompanying complete MRI report for this study.  BI-RADS CATEGORY 6:  Known biopsy-proven malignancy - appropriate action should be taken.  Original Report Authenticated By: Rolla Plate, M.D.   [name] Core Biopsy  07/22/2011  *RADIOLOGY REPORT*  Clinical Data:  Three masses noted in the 5 o'clock position of the right breast at 2, 4 and 8 cm from the nipple.  ULTRASOUND GUIDED VACUUM ASSISTED CORE BIOPSY OF THE RIGHT BREAST X THREE:  The patient had discussed the procedure of ultrasound-guided biopsy, including benefits and alternatives.  We discussed the high likelihood of [name] successful procedure. We discussed the risks of the procedure, including infection, bleeding, tissue injury, clip migration and inadequate sampling.  Informed written consent was given.  Using sterile technique, 2% lidocaine, ultrasound guidance and [name] 12 gauge vacuum assisted needle, biopsy was performed  of the mass at 5 o'clock 2 cm from the right nipple.  Using sterile technique, 2% lidocaine, ultrasound guidance and [name] 12 gauge vacuum-assisted needle, biopsy was performed of the mass at 5 o'clock 4 cm from the right nipple.  Using sterile technique, 2% lidocaine, ultrasound guidance and [name] 12 gauge vacuum-assisted needle, biopsy was performed of the mass at 5 o'clock 8 cm from the right nipple.  At the conclusion of the procedure, [name] ribbon tissue marker clip was deployed into the biopsy cavity 2 cm from the right nipple, [name] coil tissue marker clip was deployed into the biopsy cavity 4 cm from the right nipple and [name] wing tissue marker clip was deployed into the biopsy cavity 8 cm from the right nipple.  Follow-up 2-view mammogram was performed and dictated separately.  IMPRESSION: Ultrasound-guided biopsy of three masses in the 5 o'clock position of the right breast.  No apparent complications.  Original Report Authenticated By: Daryl Eastern, M.D.   [name] Core Biopsy  07/22/2011  *RADIOLOGY REPORT*  Clinical Data:  Three masses noted in the 5 o'clock position of the right breast at 2, 4 and 8  cm from the nipple.  ULTRASOUND GUIDED VACUUM ASSISTED CORE BIOPSY OF THE RIGHT BREAST X THREE:  The patient had discussed the procedure of ultrasound-guided biopsy, including benefits and alternatives.  We discussed the high likelihood of [name] successful procedure. We discussed the risks of the procedure, including infection, bleeding, tissue injury, clip migration and inadequate sampling.  Informed written consent was given.  Using sterile technique, 2% lidocaine, ultrasound guidance and [name] 12 gauge vacuum assisted needle, biopsy was performed of the mass at 5 o'clock 2 cm from the right nipple.  Using sterile technique, 2% lidocaine, ultrasound guidance and [name] 12 gauge vacuum-assisted needle, biopsy was performed of the mass at 5 o'clock 4 cm from the right nipple.  Using sterile technique, 2% lidocaine, ultrasound guidance  and [name] 12 gauge vacuum-assisted needle, biopsy was performed of the mass at 5 o'clock 8 cm from the right nipple.  At the conclusion of the procedure, [name] ribbon tissue marker clip was deployed into the biopsy cavity 2 cm from the right nipple, [name] coil tissue marker clip was deployed into the biopsy cavity 4 cm from the right nipple and [name] wing tissue marker clip was deployed into the biopsy cavity 8 cm from the right nipple.  Follow-up 2-view mammogram was performed and dictated separately.  IMPRESSION: Ultrasound-guided biopsy of three masses in the 5 o'clock position of the right breast.  No apparent complications.  Original Report Authenticated By: Daryl Eastern, M.D.   [name] Core Biopsy  07/22/2011  *RADIOLOGY REPORT*  Clinical Data:  Three masses noted in the 5 o'clock position of the right breast at 2, 4 and 8 cm from the nipple.  ULTRASOUND GUIDED VACUUM ASSISTED CORE BIOPSY OF THE RIGHT BREAST X THREE:  The patient had discussed the procedure of ultrasound-guided biopsy, including benefits and alternatives.  We discussed the high likelihood of [name] successful procedure. We discussed the risks of the procedure, including infection, bleeding, tissue injury, clip migration and inadequate sampling.  Informed written consent was given.  Using sterile technique, 2% lidocaine, ultrasound guidance and [name] 12 gauge vacuum assisted needle, biopsy was performed of the mass at 5 o'clock 2 cm from the right nipple.  Using sterile technique, 2% lidocaine, ultrasound guidance and [name] 12 gauge vacuum-assisted needle, biopsy was performed of the mass at 5 o'clock 4 cm from the right nipple.  Using sterile technique, 2% lidocaine, ultrasound guidance and [name] 12 gauge vacuum-assisted needle, biopsy was performed of the mass at 5 o'clock 8 cm from the right nipple.  At the conclusion of the procedure, [name] ribbon tissue marker clip was deployed into the biopsy cavity 2 cm from the right nipple, [name] coil tissue marker clip was deployed into  the biopsy cavity 4 cm from the right nipple and [name] wing tissue marker clip was deployed into the biopsy cavity 8 cm from the right nipple.  Follow-up 2-view mammogram was performed and dictated separately.  IMPRESSION: Ultrasound-guided biopsy of three masses in the 5 o'clock position of the right breast.  No apparent complications.  Original Report Authenticated By: Daryl Eastern, M.D.   Mm Digital Diag Ltd R  07/16/2011  *RADIOLOGY REPORT*  Clinical Data:  Screening callback for questioned right breast mass  DIGITAL DIAGNOSTIC RIGHT MAMMOGRAM WITHOUT CAD AND RIGHT BREAST ULTRASOUND:  Comparison:  Prior exams  Findings:  Additional views confirm the presence of [name] spiculated irregular mass in the right lower inner quadrant at the site of the questioned screening mammographic finding.  On  physical exam, I palpate dense tissue in the right lower inner quadrant but no focal mass.  Ultrasound is performed, showing an irregular spiculated hypoechoic shadowing mass in the right breast four o'clock location 5 cm from the nipple measuring 1.2 x 1.0 x 0.9 cm.  This corresponds to the mammographic finding.  No right axillary lymphadenopathy is identified.  IMPRESSION: Suspicious right breast mass 4 o'clock location.  Ultrasound-guided core biopsy will be scheduled at the patient's convenience. Findings and recommendations discussed with the patient and provided in written form at the time of the exam.  I also called these findings to the office of Dr. Jackelyn Knife at the time of imaging.  BI-RADS CATEGORY 5:  Highly suggestive of malignancy - appropriate action should be taken.  Recommendation:  Ultrasound guided right breast biopsy  Original Report Authenticated By: Harrel Lemon, M.D.   Mm Digital Diagnostic Unilat R  07/21/2011  *RADIOLOGY REPORT*  Clinical Data:  Three masses noted in the 5 o'clock position of the right breast at 2, 4 and 8 cm from the right nipple.  Ultrasound- guided core needle biopsy of  each mass with clip placement.  DIGITAL DIAGNOSTIC RIGHT MAMMOGRAM  Comparison:  None.  Findings:  Films are performed following ultrasound guided biopsy of three masses at 5 o'clock in the right breast, 2, 4 and 8 cm from the right nipple.  The ribbon clip is in the mass closest to the nipple.  The coil clip is in the mass 4 cm from the nipple. The wing clip was placed within the mass 8 cm from the nipple. This could not be visualized mammographically but was seen sonographically.  IMPRESSION: Appropriate clip placement following ultrasound-guided core needle biopsy of three masses in the 5 o'clock position of the right breast.  Original Report Authenticated By: Daryl Eastern, M.D.   Mm Radiologist Eval And Mgmt  07/22/2011  *RADIOLOGY REPORT*  ESTABLISHED PATIENT OFFICE VISIT - LEVEL II [phone])  Chief Complaint:  The patient returns for discussion of the pathology report after ultrasound-guided core needle biopsy of three masses in the 5 o'clock position of the right breast on 07/21/2011.  History:  Patient's screening study demonstrated mass in the right lower inner quadrant.  Further workup demonstrated three masses in the 5 o'clock position of the right breast at 2, 4 and 8 cm from the right nipple.  Each of these lesions were biopsied with ultrasound guidance.  Exam:  On physical examination, each biopsy site is healing well. There is no sign of hematoma or infection.  Assessment and Plan:  Histologic evaluation demonstrates invasive ductal carcinoma and ductal carcinoma in situ at each of the three biopsy sites.  This is concordant with the imaging findings. Results were discussed with the patient, her daughter and her husband.  She reports no complications from the procedure.  The patient has been scheduled to be seen in the [facility] on 07/30/2011.  She has been scheduled for breast MRI on 07/30/2011.  Questions were answered.  Educational materials were given.   Original Report Authenticated By: Daryl Eastern, M.D.     LABS:    Chemistry      Component Value Date/Time   NA 137 07/30/2011 0824   K 3.6 07/30/2011 0824   CL 100 07/30/2011 0824   CO2 31 07/30/2011 0824   BUN 13 07/30/2011 0824   CREATININE 0.90 07/30/2011 0824      Component Value Date/Time   CALCIUM 9.6 07/30/2011  0824   ALKPHOS 76 07/30/2011 0824   AST 12 07/30/2011 0824   ALT 7 07/30/2011 0824   BILITOT 1.0 07/30/2011 0824      Lab Results  Component Value Date   WBC 6.2 07/30/2011   HGB 12.1 07/30/2011   HCT 36.7 07/30/2011   MCV 89.3 07/30/2011   PLT 200 07/30/2011       PATHOLOGY: ADDITIONAL INFORMATION: 1. CHROMOGENIC IN-SITU HYBRIDIZATION Interpretation HER-2/NEU BY CISH - NO AMPLIFICATION OF HER-2 DETECTED. THE RATIO OF HER-2: CEP 17 SIGNALS WAS 1.24. Reference range: Ratio: HER2:CEP17 < 1.8 - gene amplification not observed Ratio: HER2:CEP 17 1.8-2.2 - equivocal result Ratio: HER2:CEP17 > 2.2 - gene amplification observed Abigail Miyamoto MD Pathologist, Electronic Signature ( Signed 07/25/2011) 1. PROGNOSTIC INDICATORS - ACIS Results IMMUNOHISTOCHEMICAL AND MORPHOMETRIC ANALYSIS BY THE AUTOMATED CELLULAR IMAGING SYSTEM (ACIS) Estrogen Receptor (Negative, <1%): 100%, STRONG STAINING INTENSITY Progesterone Receptor (Negative, <1%): 100%, STRONG STAINING INTENSITY Proliferation Marker Ki67 by M IB-1 (Low<20%): 53% All controls stained appropriately Pecola Leisure MD Pathologist, Electronic Signature ( Signed 07/25/2011) 1 of 3 FINAL for [name], [name] [phone]) FINAL DIAGNOSIS Diagnosis 1. Breast, right, needle core biopsy, 5 o'clock, 2 cm/nipple - INVASIVE DUCTAL CARCINOMA. - DUCTAL CARCINOMA IN SITU PRESENT. - SEE COMMENT. 2. Breast, right, needle core biopsy, 5 o'clock, 4 cm from nipple - INVASIVE DUCTAL CARCINOMA. - DUCTAL CARCINOMA IN SITU PRESENT. - SEE COMMENT. 3. Breast, right, needle core biopsy, 5 o'clock, 8 cm from nipple - INVASIVE DUCTAL  CARCINOMA. - DUCTAL CARCINOMA IN SITU PRESENT. - SEE COMMENT. Microscopic Comment 1. ,2,3. The carcinoma present on all three biopsies appears morphologically similar. Although definitive grading of breast carcinoma is best done on excision, the features of the tumor from the three biopsies are each compatible with [name] grade II breast carcinoma. Breast prognostic markers will be performed on the first needle core biopsy and reported as an addendum. Additional breast prognostic marker panels can be performed on the other biopsies per clinician request. The findings are called to the [facility] on 07/22/2011. Dr. Colonel Bald has seen all biopsies in consultation with agreement. (RH:kh 07/22/11) Zandra Abts MD Pathologist, Electronic Signature (  ASSESSMENT    61 year old female with:  1. Stage II invasive ductal carcinoma of the right breast mearsuing 9.1 cm ER+/PR+ Her2 negative  With ki-67 53%. Seen in [facility] for discussion of threatment options.  2. OSA  3. Asthma     PLAN:    1. Mastectomy with delayed reconstruction  2. Discussed adjuvant treatment options with patient. She will definitely receive adjuvant anti-estrogen therapy. How whether or not she will need chemotherapy decision will be made on the results of the final pathology.  3. I will see the patient back in 4 -5 weeks time.       Discussion: Patient is being treated per NCCN breast cancer care guidelines appropriate for stage.II   Thank you so much for allowing me to participate in the care of [name]. I will continue to follow up the patient with you and assist in her care.  All questions were answered. The patient knows to call the clinic with any problems, questions or concerns. We can certainly see the patient much sooner if necessary.  I spent 40 minutes counseling the patient face to face. The total time spent in the appointment was 40 minutes.  Drue Second, MD Medical/Oncology [facility] [phone] (beeper) [phone] (Office)  08/05/2011, 2:43 PM  08/05/2011, 2:43 PM

## 2011-08-07 ENCOUNTER — Encounter (HOSPITAL_COMMUNITY)
Admission: RE | Admit: 2011-08-07 | Discharge: 2011-08-07 | Disposition: A | Discharge status: Home / Self Care | Payer: Medicare Other | Source: Ambulatory Visit | Attending: Oncology | Admitting: Oncology

## 2011-08-07 DIAGNOSIS — C50319 Malignant neoplasm of lower-inner quadrant of unspecified female breast: Secondary | ICD-10-CM

## 2011-08-07 DIAGNOSIS — I517 Cardiomegaly: Secondary | ICD-10-CM | POA: Insufficient documentation

## 2011-08-07 DIAGNOSIS — Z90710 Acquired absence of both cervix and uterus: Secondary | ICD-10-CM | POA: Insufficient documentation

## 2011-08-07 DIAGNOSIS — Z9089 Acquired absence of other organs: Secondary | ICD-10-CM | POA: Insufficient documentation

## 2011-08-07 DIAGNOSIS — J9819 Other pulmonary collapse: Secondary | ICD-10-CM | POA: Insufficient documentation

## 2011-08-07 LAB — GLUCOSE, CAPILLARY: Glucose-Capillary: 97 mg/dL (ref 70–99)

## 2011-08-07 MED ORDER — FLUDEOXYGLUCOSE F - 18 (FDG) INJECTION
18.4000 | Freq: Once | INTRAVENOUS | Status: AC | PRN
  Administered 2011-08-07: 18.4 via INTRAVENOUS

## 2011-08-08 ENCOUNTER — Encounter (HOSPITAL_BASED_OUTPATIENT_CLINIC_OR_DEPARTMENT_OTHER): Payer: Self-pay | Admitting: *Deleted

## 2011-08-08 NOTE — Progress Notes (Signed)
Pt here for ctr 7/12 Uses cpap-had echo 06/24/11-good-sees dr Eden Emms for murmur. To stay rcc To come in for labs Bring all meds and cpap and overnight bag

## 2011-08-08 NOTE — Progress Notes (Signed)
Had pet scan 3/13-would not need cxr

## 2011-08-11 ENCOUNTER — Encounter (HOSPITAL_BASED_OUTPATIENT_CLINIC_OR_DEPARTMENT_OTHER)
Admission: RE | Admit: 2011-08-11 | Discharge: 2011-08-11 | Disposition: A | Discharge status: Home / Self Care | Payer: Medicare Other | Source: Ambulatory Visit | Attending: Orthopedic Surgery | Admitting: Orthopedic Surgery

## 2011-08-11 LAB — BASIC METABOLIC PANEL
CO2: 30 mEq/L (ref 19–32)
Chloride: 100 mEq/L (ref 96–112)
Creatinine, Ser: 0.8 mg/dL (ref 0.50–1.10)
Glucose, Bld: 103 mg/dL — ABNORMAL HIGH (ref 70–99)

## 2011-08-14 ENCOUNTER — Ambulatory Visit (HOSPITAL_BASED_OUTPATIENT_CLINIC_OR_DEPARTMENT_OTHER)
Admission: RE | Admit: 2011-08-14 | Discharge: 2011-08-15 | Disposition: A | Discharge status: Home / Self Care | Payer: Medicare Other | Source: Ambulatory Visit | Attending: Surgery | Admitting: Surgery

## 2011-08-14 ENCOUNTER — Encounter (HOSPITAL_BASED_OUTPATIENT_CLINIC_OR_DEPARTMENT_OTHER): Payer: Self-pay | Admitting: *Deleted

## 2011-08-14 ENCOUNTER — Encounter (HOSPITAL_BASED_OUTPATIENT_CLINIC_OR_DEPARTMENT_OTHER)
Admission: RE | Disposition: A | Discharge status: Home / Self Care | Payer: Self-pay | Source: Ambulatory Visit | Attending: Surgery

## 2011-08-14 ENCOUNTER — Ambulatory Visit (HOSPITAL_BASED_OUTPATIENT_CLINIC_OR_DEPARTMENT_OTHER): Payer: Medicare Other | Admitting: *Deleted

## 2011-08-14 ENCOUNTER — Ambulatory Visit (HOSPITAL_COMMUNITY)
Admission: RE | Admit: 2011-08-14 | Discharge: 2011-08-14 | Disposition: A | Discharge status: Home / Self Care | Payer: Medicare Other | Source: Ambulatory Visit | Attending: Surgery | Admitting: Surgery

## 2011-08-14 DIAGNOSIS — C50919 Malignant neoplasm of unspecified site of unspecified female breast: Secondary | ICD-10-CM

## 2011-08-14 DIAGNOSIS — Z01812 Encounter for preprocedural laboratory examination: Secondary | ICD-10-CM | POA: Insufficient documentation

## 2011-08-14 DIAGNOSIS — I10 Essential (primary) hypertension: Secondary | ICD-10-CM | POA: Insufficient documentation

## 2011-08-14 DIAGNOSIS — C773 Secondary and unspecified malignant neoplasm of axilla and upper limb lymph nodes: Secondary | ICD-10-CM | POA: Insufficient documentation

## 2011-08-14 DIAGNOSIS — J45909 Unspecified asthma, uncomplicated: Secondary | ICD-10-CM | POA: Insufficient documentation

## 2011-08-14 DIAGNOSIS — C50319 Malignant neoplasm of lower-inner quadrant of unspecified female breast: Secondary | ICD-10-CM

## 2011-08-14 DIAGNOSIS — G4733 Obstructive sleep apnea (adult) (pediatric): Secondary | ICD-10-CM | POA: Insufficient documentation

## 2011-08-14 HISTORY — DX: Hyperlipidemia, unspecified: E78.5

## 2011-08-14 HISTORY — DX: Unspecified osteoarthritis, unspecified site: M19.90

## 2011-08-14 HISTORY — PX: MASTECTOMY MODIFIED RADICAL: SUR848

## 2011-08-14 HISTORY — DX: Cardiac murmur, unspecified: R01.1

## 2011-08-14 SURGERY — MASTECTOMY WITH AXILLARY LYMPH NODE DISSECTION
Anesthesia: General | Site: Breast | Laterality: Right | Wound class: Clean

## 2011-08-14 MED ORDER — SODIUM CHLORIDE 0.9 % IJ SOLN
INTRAMUSCULAR | Status: DC | PRN
  Administered 2011-08-14: 09:00:00 via INTRAMUSCULAR

## 2011-08-14 MED ORDER — ONDANSETRON HCL 4 MG PO TABS: 4.0000 mg | ORAL_TABLET | Freq: Four times a day (QID) | ORAL | Status: DC | PRN

## 2011-08-14 MED ORDER — ONDANSETRON HCL 4 MG/2ML IJ SOLN
4.0000 mg | Freq: Four times a day (QID) | INTRAMUSCULAR | Status: DC | PRN
  Administered 2011-08-15: 4 mg via INTRAVENOUS

## 2011-08-14 MED ORDER — CEFAZOLIN SODIUM-DEXTROSE 2-3 GM-% IV SOLR
2.0000 g | INTRAVENOUS | Status: AC
  Administered 2011-08-14: 2 g via INTRAVENOUS

## 2011-08-14 MED ORDER — ONDANSETRON HCL 4 MG/2ML IJ SOLN
INTRAMUSCULAR | Status: DC | PRN
  Administered 2011-08-14: 4 mg via INTRAVENOUS

## 2011-08-14 MED ORDER — FENTANYL CITRATE 0.05 MG/ML IJ SOLN
INTRAMUSCULAR | Status: DC | PRN
  Administered 2011-08-14: 50 ug via INTRAVENOUS
  Administered 2011-08-14 (×3): 25 ug via INTRAVENOUS

## 2011-08-14 MED ORDER — LACTATED RINGERS IV SOLN
INTRAVENOUS | Status: DC
  Administered 2011-08-14 (×2): via INTRAVENOUS

## 2011-08-14 MED ORDER — HYDROMORPHONE HCL PF 1 MG/ML IJ SOLN
0.2500 mg | INTRAMUSCULAR | Status: DC | PRN
  Administered 2011-08-14 (×3): 0.25 mg via INTRAVENOUS

## 2011-08-14 MED ORDER — CEFAZOLIN SODIUM-DEXTROSE 2-3 GM-% IV SOLR
2.0000 g | Freq: Four times a day (QID) | INTRAVENOUS | Status: AC
  Administered 2011-08-14 – 2011-08-15 (×3): 2 g via INTRAVENOUS

## 2011-08-14 MED ORDER — CHLORHEXIDINE GLUCONATE 4 % EX LIQD: 1.0000 "application " | Freq: Once | CUTANEOUS | Status: DC

## 2011-08-14 MED ORDER — PROPOFOL 10 MG/ML IV EMUL
INTRAVENOUS | Status: DC | PRN
  Administered 2011-08-14: 300 mg via INTRAVENOUS

## 2011-08-14 MED ORDER — HYDROMORPHONE HCL PF 1 MG/ML IJ SOLN
2.0000 mg | INTRAMUSCULAR | Status: DC | PRN
  Administered 2011-08-14 (×2): 0.5 mg via INTRAVENOUS
  Administered 2011-08-15: 2 mg via INTRAVENOUS

## 2011-08-14 MED ORDER — PROMETHAZINE HCL 25 MG/ML IJ SOLN: 6.2500 mg | INTRAMUSCULAR | Status: DC | PRN

## 2011-08-14 MED ORDER — FLUTICASONE-SALMETEROL 100-50 MCG/DOSE IN AEPB
1.0000 | INHALATION_SPRAY | Freq: Two times a day (BID) | RESPIRATORY_TRACT | Status: DC
  Administered 2011-08-14: 1 via RESPIRATORY_TRACT

## 2011-08-14 MED ORDER — SIMVASTATIN 20 MG PO TABS
20.0000 mg | ORAL_TABLET | Freq: Every day | ORAL | Status: DC
  Administered 2011-08-14: 20 mg via ORAL

## 2011-08-14 MED ORDER — CHLORHEXIDINE GLUCONATE 4 % EX LIQD
1.0000 | Freq: Once | CUTANEOUS | Status: DC
Start: 2011-08-14 — End: 2011-08-14

## 2011-08-14 MED ORDER — VALSARTAN-HYDROCHLOROTHIAZIDE 160-25 MG PO TABS: 1.0000 | ORAL_TABLET | Freq: Every day | ORAL | Status: DC

## 2011-08-14 MED ORDER — EPHEDRINE SULFATE 50 MG/ML IJ SOLN
INTRAMUSCULAR | Status: DC | PRN
  Administered 2011-08-14 (×2): 5 mg via INTRAVENOUS

## 2011-08-14 MED ORDER — OXYCODONE-ACETAMINOPHEN 5-325 MG PO TABS
1.0000 | ORAL_TABLET | ORAL | Status: DC | PRN
  Administered 2011-08-14 – 2011-08-15 (×5): 2 via ORAL

## 2011-08-14 MED ORDER — ACETAMINOPHEN 10 MG/ML IV SOLN
1000.0000 mg | Freq: Once | INTRAVENOUS | Status: AC
  Administered 2011-08-14: 1000 mg via INTRAVENOUS

## 2011-08-14 MED ORDER — ALBUTEROL SULFATE HFA 108 (90 BASE) MCG/ACT IN AERS: 2.0000 | INHALATION_SPRAY | Freq: Four times a day (QID) | RESPIRATORY_TRACT | Status: DC | PRN

## 2011-08-14 MED ORDER — LATANOPROST 0.005 % OP SOLN
1.0000 [drp] | Freq: Every day | OPHTHALMIC | Status: DC
  Administered 2011-08-14: 1 [drp] via OPHTHALMIC

## 2011-08-14 MED ORDER — MIDAZOLAM HCL 2 MG/2ML IJ SOLN
1.0000 mg | INTRAMUSCULAR | Status: DC | PRN
  Administered 2011-08-14: 1 mg via INTRAVENOUS

## 2011-08-14 MED ORDER — DEXAMETHASONE SODIUM PHOSPHATE 4 MG/ML IJ SOLN
INTRAMUSCULAR | Status: DC | PRN
  Administered 2011-08-14: 10 mg via INTRAVENOUS

## 2011-08-14 MED ORDER — LIDOCAINE HCL (CARDIAC) 20 MG/ML IV SOLN
INTRAVENOUS | Status: DC | PRN
  Administered 2011-08-14: 60 mg via INTRAVENOUS

## 2011-08-14 MED ORDER — FENTANYL CITRATE 0.05 MG/ML IJ SOLN
50.0000 ug | INTRAMUSCULAR | Status: DC | PRN
  Administered 2011-08-14: 50 ug via INTRAVENOUS

## 2011-08-14 MED ORDER — TECHNETIUM TC 99M SULFUR COLLOID FILTERED
1.0000 | Freq: Once | INTRAVENOUS | Status: AC | PRN
  Administered 2011-08-14: 1 via INTRADERMAL

## 2011-08-14 MED ORDER — DEXTROSE IN LACTATED RINGERS 5 % IV SOLN
INTRAVENOUS | Status: DC
  Administered 2011-08-14: 13:00:00 via INTRAVENOUS

## 2011-08-14 SURGICAL SUPPLY — 63 items
ADH SKN CLS APL DERMABOND .7 (GAUZE/BANDAGES/DRESSINGS) ×4
APPLIER CLIP 11 MED OPEN (CLIP)
APPLIER CLIP 9.375 MED OPEN (MISCELLANEOUS) ×6
APR CLP MED 11 20 MLT OPN (CLIP)
APR CLP MED 9.3 20 MLT OPN (MISCELLANEOUS) ×4
BANDAGE ELASTIC 6 VELCRO ST LF (GAUZE/BANDAGES/DRESSINGS) ×1 IMPLANT
BIOPATCH RED 1 DISK 7.0 (GAUZE/BANDAGES/DRESSINGS) ×5 IMPLANT
BLADE HEX COATED 2.75 (ELECTRODE) ×3 IMPLANT
BLADE SURG 10 STRL SS (BLADE) ×3 IMPLANT
BLADE SURG 15 STRL LF DISP TIS (BLADE) ×2 IMPLANT
BLADE SURG 15 STRL SS (BLADE) ×3
BLADE SURG ROTATE 9660 (MISCELLANEOUS) IMPLANT
CANISTER SUCTION 1200CC (MISCELLANEOUS) ×3 IMPLANT
CHLORAPREP W/TINT 26ML (MISCELLANEOUS) ×3 IMPLANT
CLIP APPLIE 11 MED OPEN (CLIP) IMPLANT
CLIP APPLIE 9.375 MED OPEN (MISCELLANEOUS) ×2 IMPLANT
CLOTH BEACON ORANGE TIMEOUT ST (SAFETY) ×3 IMPLANT
COVER MAYO STAND STRL (DRAPES) ×3 IMPLANT
COVER PROBE W GEL 5X96 (DRAPES) ×3 IMPLANT
COVER TABLE BACK 60X90 (DRAPES) ×3 IMPLANT
DECANTER SPIKE VIAL GLASS SM (MISCELLANEOUS) IMPLANT
DERMABOND ADVANCED (GAUZE/BANDAGES/DRESSINGS) ×2
DERMABOND ADVANCED .7 DNX12 (GAUZE/BANDAGES/DRESSINGS) ×3 IMPLANT
DRAIN CHANNEL 19F RND (DRAIN) ×5 IMPLANT
DRAPE LAPAROSCOPIC ABDOMINAL (DRAPES) ×2 IMPLANT
DRAPE U-SHAPE 76X120 STRL (DRAPES) IMPLANT
DRAPE UTILITY XL STRL (DRAPES) ×3 IMPLANT
DRSG TEGADERM 2-3/8X2-3/4 SM (GAUZE/BANDAGES/DRESSINGS) ×5 IMPLANT
ELECT BLADE 4.0 EZ CLEAN MEGAD (MISCELLANEOUS) ×3
ELECT REM PT RETURN 9FT ADLT (ELECTROSURGICAL) ×3
ELECTRODE BLDE 4.0 EZ CLN MEGD (MISCELLANEOUS) ×2 IMPLANT
ELECTRODE REM PT RTRN 9FT ADLT (ELECTROSURGICAL) ×2 IMPLANT
EVACUATOR SILICONE 100CC (DRAIN) ×5 IMPLANT
GLOVE BIO SURGEON STRL SZ 6.5 (GLOVE) IMPLANT
GLOVE ECLIPSE 6.5 STRL STRAW (GLOVE) ×2 IMPLANT
GLOVE EUDERMIC 7 POWDERFREE (GLOVE) ×3 IMPLANT
GLOVE INDICATOR 7.0 STRL GRN (GLOVE) ×2 IMPLANT
GOWN PREVENTION PLUS XLARGE (GOWN DISPOSABLE) ×6 IMPLANT
NDL HYPO 25X1 1.5 SAFETY (NEEDLE) ×2 IMPLANT
NDL SAFETY ECLIPSE 18X1.5 (NEEDLE) ×2 IMPLANT
NEEDLE HYPO 18GX1.5 SHARP (NEEDLE) ×3
NEEDLE HYPO 25X1 1.5 SAFETY (NEEDLE) ×6 IMPLANT
NS IRRIG 1000ML POUR BTL (IV SOLUTION) ×3 IMPLANT
PACK BASIN DAY SURGERY FS (CUSTOM PROCEDURE TRAY) ×3 IMPLANT
PEN SKIN MARKING BROAD TIP (MISCELLANEOUS) ×1 IMPLANT
PENCIL BUTTON HOLSTER BLD 10FT (ELECTRODE) ×3 IMPLANT
PIN SAFETY STERILE (MISCELLANEOUS) ×3 IMPLANT
SHEET MEDIUM DRAPE 40X70 STRL (DRAPES) ×1 IMPLANT
SLEEVE SCD COMPRESS KNEE MED (MISCELLANEOUS) ×3 IMPLANT
SPONGE GAUZE 4X4 12PLY (GAUZE/BANDAGES/DRESSINGS) ×3 IMPLANT
SPONGE LAP 18X18 X RAY DECT (DISPOSABLE) ×5 IMPLANT
SPONGE LAP 4X18 X RAY DECT (DISPOSABLE) ×3 IMPLANT
STAPLER VISISTAT 35W (STAPLE) ×3 IMPLANT
SUT ETHILON 2 0 FS 18 (SUTURE) ×4 IMPLANT
SUT MNCRL AB 4-0 PS2 18 (SUTURE) ×2 IMPLANT
SUT SILK 2 0 SH (SUTURE) IMPLANT
SUT VICRYL 3-0 CR8 SH (SUTURE) ×5 IMPLANT
SYR CONTROL 10ML LL (SYRINGE) ×6 IMPLANT
TOWEL OR 17X24 6PK STRL BLUE (TOWEL DISPOSABLE) ×6 IMPLANT
TOWEL OR NON WOVEN STRL DISP B (DISPOSABLE) ×3 IMPLANT
TUBE CONNECTING 20X1/4 (TUBING) ×3 IMPLANT
WATER STERILE IRR 1000ML POUR (IV SOLUTION) ×1 IMPLANT
YANKAUER SUCT BULB TIP NO VENT (SUCTIONS) ×3 IMPLANT

## 2011-08-14 NOTE — Anesthesia Procedure Notes (Addendum)
Procedure Name: LMA Insertion Date/Time: 08/14/2011 8:30 AM Performed by: Meyer Russel Pre-anesthesia Checklist: Patient identified, Timeout performed, Emergency Drugs available, Suction available and Patient being monitored Patient Re-evaluated:Patient Re-evaluated prior to inductionOxygen Delivery Method: Circle system utilized Preoxygenation: Pre-oxygenation with 100% oxygen Intubation Type: IV induction Ventilation: Mask ventilation without difficulty LMA: LMA with gastric port inserted and LMA inserted LMA Size: 4.0 Grade View: Grade II Number of attempts: 2 (# 4 LMA Supreme inserted easily but would nor seat well. Replaced with  #4 Unique, +EtCo2, BSBE) Placement Confirmation: breath sounds checked- equal and bilateral and positive ETCO2 Tube secured with: Tape Dental Injury: Teeth and Oropharynx as per pre-operative assessment

## 2011-08-14 NOTE — Transfer of Care (Signed)
Immediate Anesthesia Transfer of Care Note  Patient: [name]  Procedure(s) Performed: Procedure(s) (LRB): MASTECTOMY WITH AXILLARY LYMPH NODE DISSECTION (Right)  Patient Location: PACU  Anesthesia Type: General  Level of Consciousness: awake, oriented and patient cooperative  Airway & Oxygen Therapy: Patient Spontanous Breathing and Patient connected to nasal cannula oxygen  Post-op Assessment: Report given to PACU RN, Post -op Vital signs reviewed and stable and Patient moving all extremities  Post vital signs: Reviewed and stable  Complications: No apparent anesthesia complications

## 2011-08-14 NOTE — Progress Notes (Signed)
Assisted with nuclear med injection. Pt. Tolerated well vital signs stable no complaints of pain

## 2011-08-14 NOTE — Anesthesia Preprocedure Evaluation (Addendum)
Anesthesia Evaluation  Patient identified by MRN, date of birth, ID band Patient awake    Reviewed: Allergy & Precautions, H&P , NPO status , Patient's Chart, lab work & pertinent test results  Airway Mallampati: II TM Distance: >3 FB Neck ROM: Full    Dental No notable dental hx. (+) Teeth Intact   Pulmonary asthma , sleep apnea and Continuous Positive Airway Pressure Ventilation ,    Pulmonary exam normal       Cardiovascular hypertension, On Medications     Neuro/Psych negative neurological ROS  negative psych ROS   GI/Hepatic negative GI ROS, Neg liver ROS,   Endo/Other  negative endocrine ROSMorbid obesity  Renal/GU negative Renal ROS  negative genitourinary   Musculoskeletal   Abdominal   Peds  Hematology negative hematology ROS (+)   Anesthesia Other Findings   Reproductive/Obstetrics negative OB ROS                           Anesthesia Physical Anesthesia Plan  ASA: III  Anesthesia Plan: General   Post-op Pain Management:    Induction: Intravenous  Airway Management Planned: LMA  Additional Equipment:   Intra-op Plan:   Post-operative Plan: Extubation in OR  Informed Consent: I have reviewed the patients History and Physical, chart, labs and discussed the procedure including the risks, benefits and alternatives for the proposed anesthesia with the patient or authorized representative who has indicated his/her understanding and acceptance.     Plan Discussed with: CRNA  Anesthesia Plan Comments:         Anesthesia Quick Evaluation

## 2011-08-14 NOTE — Op Note (Signed)
[name] Apr 23, 1951 [id number] 07/30/2011  Preoperative diagnosis: Right breast cancer, lower inner quadrant, possibly stage II  Postoperative diagnosis: Right breast cancer Laurinda quadrant was aspirin metastases  Procedure: Right modified mastectomy with the digestion and axillary sentinel lymph node identification  Surgeon: Currie Paris   Assistant surgeon: None  Anesthesia:General  Clinical History and Indications:The patient is seen in the holding area and we reviewed the plans for the procedure as noted above. We reviewed the risks and complications a final time. She had no further questions. I marked theright side as the operative side.  Description of Procedure: The patient was taken to the operating room. After satisfactory general anesthesia was obtained the timeout was done.   I then injected 5 cc of dilute methylene blue and injected it subareaorly and massaged it in. A full prep and drape was then done.  I outlined an elliptical incision and marked the inframammary fold and midline. The incision was made. The usual skin flaps were raised. I went to the clavicle superiorly and sternum medially and towards the latissimus laterally.    After the superior flap was complete I was able to use the neoprobe to locate a sentinel node.  There were 2 large blue lymphatics leading to a single enlargement soft. Lymph node. This was removed and had counseled about 600. Nearby hot area was identified and removed in a satisfactory small node. Both were forwarded to pathology.     The inferior flap was then made going to the inframammary fold and out to the latissimus. The breast was then removed from the pectoralis starting medially and working laterally.  I got to the lateral aspect of the pectoralis major muscle I opened the clavipectoral fascia. I finished removing the breast from the serratus muscles.    I then completed the mastectomy by dividing the remaining  attachments from the breast to the chest wall and latissimus but not taking any more tissue from the axilla. The specimen was marked to orient it for the pathologist and passed off the table. At this point, the pathologist called to report that the first lymph node was positive with a visible metastasis. Therefore, as plan, I completed the axilla. Dissection. I opened up more. The clavipectoral fascia. I took the tissue off of the lateral chest wall and identified. The axillary vein. The maxillary contents were then swept from medial to lateral and superior to inferior. I avoided the area of the long thoracic nerve and identify the thoracodorsal vessels. I also took all the tissue intervening and completed the axillary dissection.  I irrigated and made a thorough search for bleeders to make sure everything was dry. I did use cautery and clips as we were doing the mastectomy and axillary dissection. I put 219 Blake drains in and secured them with a 2-0 nylon. Another irrigation and final check for hemostasis was made.  I spent several minutes irrigating and making sure everything was dry. I then placed a 56 [place] Blake drain through a small incision in the inferior flap and laid along the pectoralis muscle. It was secured with a 2-0 nylon suture.  Another irrigation and check for hemostasis was made. Everything appeared to be dry. Incision was then closed with interrupted 3-0 Vicryl followed by running 4-0 Monocryl subcuticular and Dermabond.The patient tolerated the procedure well. There were no operative complications. All counts were correct. Estimated blood loss was 100 cc. Sterile dressings were applied and the patient taken to the PACU in satisfactory condition.  Currie Paris, MD, FACS 08/14/2011 10:31 AM

## 2011-08-14 NOTE — H&P (View-Only) (Signed)
Patient ID: [name], female   DOB: 1950-09-13, 61 y.o.   MRN: [id number]  Chief Complaint  Patient presents with  . Breast Cancer    HPI [name] is a 61 y.o. female.  She had a recent mammogram and a right breast abnormality was noted. There appeared to be 3 separate areas in a radially oriented fashion in the medial aspect of the breast. Biopsies were done and has shown invasive ductal carcinoma. They are receptor positive, HER-2 negative, with a Ki-67 of 53%. She is asymptomatic. She has not felt a mass. She notes that about 4 years ago she had some nipple discharge and apparently was thought to have some pituitary abnormality. This resolved and she has not had any further symptoms from the breast. HPI  Past Medical History  Diagnosis Date  . Hypertension   . OSA (obstructive sleep apnea)   . Asthma   . Cancer of lower-inner quadrant of female breast 07/24/2011    Past Surgical History  Procedure Date  . Tubal ligation   . Dilation and curettage of uterus     x3  . Carpal tunnel release   . Hand surgery     2012  . Cholecystectomy 1987    open - Dr Wiliam Ke    Family History  Problem Relation Age of Onset  . Cancer Father     Social History History  Substance Use Topics  . Smoking status: Never Smoker   . Smokeless tobacco: Not on file  . Alcohol Use: No    Allergies  Allergen Reactions  . Trandolapril-Verapamil Hcl     REACTION: lip swelling    Current Outpatient Prescriptions  Medication Sig Dispense Refill  . albuterol (PROAIR HFA) 108 (90 BASE) MCG/ACT inhaler Inhale 2 puffs into the lungs every 6 (six) hours as needed.        . cabergoline (DOSTINEX) 0.5 MG tablet Take 0.25 mg by mouth 3 (three) times a week.       . Fluticasone-Salmeterol (ADVAIR DISKUS) 100-50 MCG/DOSE AEPB Inhale 1 puff into the lungs 2 (two) times daily.        . furosemide (LASIX) 20 MG tablet Take 20 mg by mouth 2 (two) times daily.        Marland Kitchen latanoprost (XALATAN) 0.005 %  ophthalmic solution 1 drop at bedtime.      . simvastatin (ZOCOR) 20 MG tablet Take 20 mg by mouth at bedtime.        . valsartan-hydrochlorothiazide (DIOVAN-HCT) 160-25 MG per tablet Take 1 tablet by mouth daily.          Review of Systems Review of Systems  Constitutional: Negative for fever, chills and unexpected weight change.  HENT: Negative for hearing loss, congestion, sore throat, trouble swallowing and voice change.   Eyes: Negative for visual disturbance.  Respiratory: Negative for cough and wheezing.   Cardiovascular: Negative for chest pain, palpitations and leg swelling.  Gastrointestinal: Negative for nausea, vomiting, abdominal pain, diarrhea, constipation, blood in stool, abdominal distention and anal bleeding.  Genitourinary: Negative for hematuria, vaginal bleeding and difficulty urinating.  Musculoskeletal: Negative for arthralgias.  Skin: Negative for rash and wound.  Neurological: Negative for seizures, syncope and headaches.  Hematological: Negative for adenopathy. Does not bruise/bleed easily.  Psychiatric/Behavioral: Negative for confusion.    Wt Readings from Last 3 Encounters:  07/30/11 285 lb 3.2 oz (129.366 kg)  06/17/11 293 lb (132.904 kg)  11/21/10 293 lb 12.8 oz (133.267 kg)   Temp  Readings from Last 3 Encounters:  07/30/11 98.4 F (36.9 C) Oral  11/21/10 98.2 F (36.8 C) Oral   BP Readings from Last 3 Encounters:  07/30/11 143/84  06/17/11 152/84  11/21/10 122/70   Pulse Readings from Last 3 Encounters:  07/30/11 73  06/17/11 84  11/21/10 82     Physical Exam Physical Exam  Vitals reviewed. Constitutional: She is oriented to person, place, and time. She appears well-developed and well-nourished. No distress.  HENT:  Head: Normocephalic and atraumatic.  Mouth/Throat: Oropharynx is clear and moist.  Eyes: Conjunctivae and EOM are normal. Pupils are equal, round, and reactive to light. No scleral icterus.  Neck: Normal range of motion.  Neck supple. No tracheal deviation present. No thyromegaly present.  Cardiovascular: Normal rate, regular rhythm, normal heart sounds and intact distal pulses.  Exam reveals no gallop and no friction rub.   No murmur heard. Pulmonary/Chest: Effort normal and breath sounds normal. No respiratory distress. She has no wheezes. She has no rales.         Ecchymosis as noted  Abdominal: Soft. Bowel sounds are normal. She exhibits no distension and no mass. There is no tenderness. There is no rebound and no guarding.  Musculoskeletal: Normal range of motion. She exhibits no edema and no tenderness.  Neurological: She is alert and oriented to person, place, and time.  Skin: Skin is warm and dry. No rash noted. She is not diaphoretic. No erythema.  Psychiatric: She has a normal mood and affect. Her behavior is normal. Judgment and thought content normal.    Data Reviewed I have reviewed the mammograms and the MRI films, port, and discussed them with the radiologist. I have reviewed the pathology reports and slides and discuss them with the pathologist.  Assessment    Right breast cancer, possible clinical stage III based on size.    Plan    Surgically I think she'll need to have a mastectomy with sentinel node evaluation. At our multidisciplinary conference a preoperative PET scan was recommended. If that is negative then she may or may not need radiation and she may or may not need chemotherapy.  I have explained the pathophysiology and staging of breast cancer with particular attention to her exact situation. We discussed the multidisciplinary approach to breast cancer which often includes both medical and radiation oncology consultations.Those will be done today  We also discussed surgical options for the treatment of breast cancer including lumpectomy and mastectomy with possible reconstructive surgery. In addition we talked about the evaluation and management of lymph nodes including a  description of sentinel lymph node biopsy and axillary dissections. We reviewed potential complications and risks including bleeding, infection, numbness,  lymphedema, and the potential need for additional surgery.  She understands that for patients who are candidate for lumpectomy or mastectomy there is an equal survival rate with either technique, but a slightly higher local recurrence rate with lumpectomy. In addition she knows that a lumpectomy usually requires postoperative radiation as part of the management of the breast cancer.  We have discussed the likely postoperative course and plans for followup.  I have given the patient some written information that reviewed all of these issues. I believe her questions are answered and that she has a good understanding of the issues.        Damani Rando J 07/30/2011, 9:49 AM

## 2011-08-14 NOTE — Interval H&P Note (Signed)
History and Physical Interval Note:  08/14/2011 7:14 AM  [name]  has presented today for surgery, with the diagnosis of right breast cancer  The various methods of treatment have been discussed with the patient and family. After consideration of risks, benefits and other options for treatment, the patient has consented to  Procedure(s) (LRB): SIMPLE MASTECTOMY WITH AXILLARY SENTINEL NODE BIOPSY (Right) as a surgical intervention .  The patients' history has been reviewed, patient examined, no change in status, stable for surgery.  I have reviewed the patients' chart and labs.  Questions were answered to the patient's satisfaction.  The right breast ins marked as the operative site   Brandonlee Navis J

## 2011-08-14 NOTE — Anesthesia Postprocedure Evaluation (Signed)
  Anesthesia Post-op Note  Patient: [name]  Procedure(s) Performed: Procedure(s) (LRB): MASTECTOMY WITH AXILLARY LYMPH NODE DISSECTION (Right)  Patient Location: PACU  Anesthesia Type: General  Level of Consciousness: awake and alert   Airway and Oxygen Therapy: Patient Spontanous Breathing and Patient connected to face mask oxygen  Post-op Pain: mild  Post-op Assessment: Post-op Vital signs reviewed, Patient's Cardiovascular Status Stable, Respiratory Function Stable and Patent Airway  Post-op Vital Signs: Reviewed and stable  Complications: No apparent anesthesia complications

## 2011-08-15 ENCOUNTER — Telehealth (INDEPENDENT_AMBULATORY_CARE_PROVIDER_SITE_OTHER): Payer: Self-pay | Admitting: Surgery

## 2011-08-15 MED ORDER — HYDROMORPHONE HCL 2 MG PO TABS: 2.0000 mg | ORAL_TABLET | ORAL | Status: DC | PRN

## 2011-08-15 NOTE — Discharge Summary (Signed)
1 Day Post-Op  Subjective: Nausea apparently from oral pain med, better now. Pain well controlled, knows JP management, feels able to go home  Objective: Vital signs in last 24 hours: Temp:  [97.2 F (36.2 C)-98.7 F (37.1 C)] 97.8 F (36.6 C) (03/22 0600) Pulse Rate:  [51-68] 57  (03/22 0646) Resp:  [11-21] 18  (03/22 0646) BP: (101-154)/(56-77) 113/65 mmHg (03/22 0600) SpO2:  [91 %-100 %] 99 % (03/22 0646)   Intake/Output from previous day: 03/21 0701 - 03/22 0700 In: 4683.3 [P.O.:2152; I.V.:2431.3; IV Piggyback:100] Out: 1480 [Urine:1150; Drains:330] Intake/Output this shift: Total I/O In: 360 [P.O.:360] Out: -    General appearance: alert and no distress Resp: clear to auscultation bilaterally  Incision: Dressing dry, JP's pink lemonade color  Lab Results:   Basename 08/14/11 0656  WBC --  HGB 11.7*  HCT --  PLT --   BMET No results found for this basename: NA:2,K:2,CL:2,CO2:2,GLUCOSE:2,BUN:2,CREATININE:2,CALCIUM:2 in the last 72 hours PT/INR No results found for this basename: LABPROT:2,INR:2 in the last 72 hours ABG No results found for this basename: PHART:2,PCO2:2,PO2:2,HCO3:2 in the last 72 hours  MEDS, Scheduled    .  ceFAZolin (ANCEF) IV  2 g Intravenous Q6H  . Fluticasone-Salmeterol  1 puff Inhalation BID  . latanoprost  1 drop Both Eyes QHS  . simvastatin  20 mg Oral QHS  . valsartan-hydrochlorothiazide  1 tablet Oral Daily  . DISCONTD: chlorhexidine  1 application Topical Once  . DISCONTD: chlorhexidine  1 application Topical Once    Studies/Results: Nm Sentinel Node Inj-no Rpt (breast)  08/14/2011  CLINICAL DATA: right breast cancer   Sulfur colloid was injected intradermally by the nuclear medicine  technologist for breast cancer sentinel node localization.      Assessment: s/p Procedure(s): MASTECTOMY WITH AXILLARY LYMPH NODE DISSECTION Doing well and able to go home  Plan: Discharge   LOS: 1 day     Currie Paris, MD,  [facility], [place] [phone]   08/15/2011 8:35 AM

## 2011-08-15 NOTE — Telephone Encounter (Signed)
Discussed path report with her today. She feels she is doing well after discharge.

## 2011-08-15 NOTE — Discharge Instructions (Signed)
[facility]  [address] [place], [place] [address] [phone]   Post Anesthesia Home Care Instructions  Activity: Get plenty of rest for the remainder of the day. A responsible adult should stay with you for 24 hours following the procedure.  For the next 24 hours, DO NOT: -Drive a car -Operate machinery -Drink alcoholic beverages -Take any medication unless instructed by your physician -Make any legal decisions or sign important papers.  Meals: Start with liquid foods such as gelatin or soup. Progress to regular foods as tolerated. Avoid greasy, spicy, heavy foods. If nausea and/or vomiting occur, drink only clear liquids until the nausea and/or vomiting subsides. Call your physician if vomiting continues.  Special Instructions/Symptoms: Your throat may feel dry or sore from the anesthesia or the breathing tube placed in your throat during surgery. If this causes discomfort, gargle with warm salt water. The discomfort should disappear within 24 hours.   

## 2011-08-16 ENCOUNTER — Telehealth (INDEPENDENT_AMBULATORY_CARE_PROVIDER_SITE_OTHER): Payer: Self-pay | Admitting: General Surgery

## 2011-08-16 ENCOUNTER — Emergency Department (HOSPITAL_COMMUNITY)
Admission: EM | Admit: 2011-08-16 | Discharge: 2011-08-16 | Disposition: A | Discharge status: Home / Self Care | Payer: Medicare Other | Attending: Emergency Medicine | Admitting: Emergency Medicine

## 2011-08-16 ENCOUNTER — Encounter (HOSPITAL_COMMUNITY): Payer: Self-pay | Admitting: Emergency Medicine

## 2011-08-16 DIAGNOSIS — C50919 Malignant neoplasm of unspecified site of unspecified female breast: Secondary | ICD-10-CM | POA: Insufficient documentation

## 2011-08-16 DIAGNOSIS — J45909 Unspecified asthma, uncomplicated: Secondary | ICD-10-CM | POA: Insufficient documentation

## 2011-08-16 DIAGNOSIS — I10 Essential (primary) hypertension: Secondary | ICD-10-CM | POA: Insufficient documentation

## 2011-08-16 DIAGNOSIS — E785 Hyperlipidemia, unspecified: Secondary | ICD-10-CM | POA: Insufficient documentation

## 2011-08-16 DIAGNOSIS — Z4889 Encounter for other specified surgical aftercare: Secondary | ICD-10-CM | POA: Insufficient documentation

## 2011-08-16 DIAGNOSIS — G4733 Obstructive sleep apnea (adult) (pediatric): Secondary | ICD-10-CM | POA: Insufficient documentation

## 2011-08-16 NOTE — Discharge Instructions (Signed)
Incision Care An incision is when a surgeon cuts into your body tissues. After surgery, the incision needs to be cared for properly to prevent infection.  HOME CARE INSTRUCTIONS   Take all medicine as directed by your caregiver. Only take over-the-counter or prescription medicines for pain, discomfort, or fever as directed by your caregiver.   Do not remove your bandage (dressing) or get your incision wet until your surgeon gives you permission. In the event that your dressing becomes wet, dirty, or starts to smell, change the dressing and call your surgeon for instructions as soon as possible.   Take showers. Do not take tub baths, swim, or do anything that may soak the wound until it is healed.   Resume your normal diet and activities as directed or allowed.   Avoid lifting any weight until you are instructed otherwise.   Use anti-itch antihistamine medicine as directed by your caregiver. The wound may itch when it is healing. Do not pick or scratch at the wound.   Follow up with your caregiver for stitch (suture) or staple removal as directed.   Drink enough fluids to keep your urine clear or pale yellow.  SEEK MEDICAL CARE IF:   You have redness, swelling, or increasing pain in the wound that is not controlled with medicine.   You have drainage, blood, or pus coming from the wound that lasts longer than 1 day.   You develop muscle aches, chills, or a general ill feeling.   You notice a bad smell coming from the wound or dressing.   Your wound edges separate after the sutures, staples, or skin adhesive strips have been removed.   You develop persistent nausea or vomiting.  SEEK IMMEDIATE MEDICAL CARE IF:   You have a fever.   You develop a rash.   You develop dizzy episodes or faint while standing.   You have difficulty breathing.   You develop any reaction or side effects to medicine given.  MAKE SURE YOU:   Understand these instructions.   Will watch your  condition.   Will get help right away if you are not doing well or get worse.  Document Released: 11/29/2004 Document Revised: 05/01/2011 Document Reviewed: 09/15/2010 [facility] Patient Information 2012 [place], [place].Hemovac, Home Care A Hemovac is a round plastic drain that must stay compressed. The compression creates a gentle suction that removes excess fluid from a surgical wound. Patient wounds often generate excess fluid. Removing this fluid helps promote healing and decreases the risk of infection.  DAILY CARE OF HEMOVAC  Keep the drain compressed at all times except while emptying it.   Keep the site where the drain enters the skin dry and covered with a dressing.   You may pin the drain to your clothing with a safety pin.   The first few days after surgery there may be a lot of fluid in the drain. Empty the drain whenever it becomes half full since the drain does not create enough suction if it's too full. Record the drainage amount as instructed by your caregiver.   If you see a clot in the drain, it is okay to leave the clot. However, if the hemovac does not appear to be draining, let your caregiver know.   Your caregiver will take the drain out when the drainage decreases.  HOW TO EMPTY THE HEMOVAC  Wash your hands.   Open the stopper to release suction. This will cause the hemovac to unfold and expand.   Hold  the stopper out of the way. Do not touch the opening. Pour the drainage into the measuring cup that was given to you.   If a clot sticks to the opening, remove it with a sterile dressing.   To re-establish suction, squeeze both sides of the drain until it goes flat. Replace the stopper while you are squeezing the drain.   Write down the drainage amount.  SEEK MEDICAL CARE IF:  The drain does not stay compressed, call your caregiver. Sometimes the drain moves out of the wound and does not allow the hemovac to stay compressed.   There is a lot more drainage than  usual or it is bright red.   The hemovac insertion site becomes red, hot or swollen.   The hemovac insertion site is draining yellow fluid.   You develop a temperature of 102 F (38.9 C).  If you have any questions or concerns, call your caregiver. MAKE SURE YOU:   Understand these instructions.   Will watch your condition.   Will get help right away if you are not doing well or get worse.  Document Released: 05/05/2006 Document Revised: 05/01/2011 Document Reviewed: 12/13/2007 [facility] Patient Information 2012 [place], [place].

## 2011-08-16 NOTE — ED Notes (Signed)
Pt. Stated, i had a mastectomy on Thursday on my rt. Breast and the drainage tube has a clot in it. Called Dr. He said to come here for a new one.

## 2011-08-16 NOTE — ED Provider Notes (Signed)
Medical screening examination/treatment/procedure(s) were performed by non-physician practitioner and as supervising physician I was immediately available for consultation/collaboration.   Forbes Cellar, MD 08/16/11 1225

## 2011-08-16 NOTE — ED Provider Notes (Signed)
History     CSN: [id number]  Arrival date & time 08/16/11  0865   First MD Initiated Contact with Patient 08/16/11 1008      Chief Complaint  Patient presents with  . Drainage from Incision    (Consider location/radiation/quality/duration/timing/severity/associated sxs/prior treatment) HPI  Pt presents to the ED with complaints of a large blood clot in her wound drainage bulb, she is supposed to measure her drainage but couldn't because she couldn't get clot out.. Pt had a mastectomy done this past Thursday by [facility]. The patient denies having any other complications. She states that her pain is well controlled, no puss or abnormal drainage coming from wound. She denies fevers, chills and weakness. Pt is alert and oriented and in no acute distress.  Past Medical History  Diagnosis Date  . Hypertension   . OSA (obstructive sleep apnea)   . Asthma   . 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 Hyperlipemia   . Heart murmur   . Arthritis     Past Surgical History  Procedure Date  . Tubal ligation   . Dilation and curettage of uterus     x3  . Carpal tunnel release   . Hand surgery     2012  . Cholecystectomy 1987    open - Dr Wiliam Ke  . Abdominal hysterectomy     Family History  Problem Relation Age of Onset  . Cancer Father     History  Substance Use Topics  . Smoking status: Never Smoker   . Smokeless tobacco: Not on file  . Alcohol Use: No    OB History    Grav Para Term Preterm Abortions TAB SAB Ect Mult Living                  Review of Systems  All other systems reviewed and are negative.    Allergies  Trandolapril-verapamil hcl  Home Medications   Current Outpatient Rx  Name Route Sig Dispense Refill  . ALBUTEROL SULFATE HFA 108 (90 BASE) MCG/ACT IN AERS Inhalation Inhale 2 puffs into the lungs every 6 (six) hours as needed. For shortness of breath    . CABERGOLINE 0.5 MG PO TABS Oral Take 0.25 mg by mouth every  Monday, Wednesday, and Friday.     Marland Kitchen FLUTICASONE-SALMETEROL 100-50 MCG/DOSE IN AEPB Inhalation Inhale 1 puff into the lungs daily.     . FUROSEMIDE 20 MG PO TABS Oral Take 20 mg by mouth 2 (two) times daily.      Marland Kitchen HYDROMORPHONE HCL 2 MG PO TABS Oral Take 2 mg by mouth every 4 (four) hours as needed. For pain.    Marland Kitchen LATANOPROST 0.005 % OP SOLN Both Eyes Place 1 drop into both eyes at bedtime.     . ADULT MULTIVITAMIN W/MINERALS CH Oral Take 1 tablet by mouth daily.    Marland Kitchen SIMVASTATIN 20 MG PO TABS Oral Take 20 mg by mouth daily.     Marland Kitchen VALSARTAN-HYDROCHLOROTHIAZIDE 160-25 MG PO TABS Oral Take 1 tablet by mouth daily.        BP 133/62  Pulse 62  Temp(Src) 98.2 F (36.8 C) (Oral)  Resp 22  SpO2 98%  Physical Exam  Nursing note and vitals reviewed. Constitutional: She appears well-developed and well-nourished. No distress.  HENT:  Head: Normocephalic and atraumatic.  Eyes: Pupils are equal, round, and reactive to light.  Neck: Normal range of motion. Neck supple.  Cardiovascular: Normal rate and regular rhythm.  Pulmonary/Chest: Effort normal.       Wound is wrapped up in sterile dressing. No puss or abnormal colored fluid draining from either of the two wound bulbs.  Abdominal: Soft.  Neurological: She is alert.  Skin: Skin is warm and dry.    ED Course  Procedures (including critical care time)  Labs Reviewed - No data to display No results found.   1. Surgery aftercare       MDM  Bulb brought down from ER and switched out for patient without any complications. Pt has follow-up appointment coming up this Thursday which she plans to attend.  I have discussed the patient with my supervising attending (Dr. Hyman Hopes) who is aware of my work-up and plan.         Dorthula Matas, PA 08/16/11 1211

## 2011-08-16 NOTE — Telephone Encounter (Signed)
Her mother, Varnell Donate, had a blood clot in her drainage bulb and is being driven to [facility] ED by Mr. Camerer.  I told the daughter as long as there is no significant bleeding, they may need to just replace the bulb.

## 2011-08-16 NOTE — ED Notes (Signed)
New bulb placed on JP drain.  25mL blood in old bulb.

## 2011-08-18 ENCOUNTER — Telehealth (INDEPENDENT_AMBULATORY_CARE_PROVIDER_SITE_OTHER): Payer: Self-pay | Admitting: General Surgery

## 2011-08-18 NOTE — Telephone Encounter (Signed)
Message copied by Liliana Cline on Mon Aug 18, 2011  8:42 AM ------      Message from: Currie Paris      Created: Mon Aug 18, 2011  8:05 AM       She was in the ED Saturday with ? Of problem with JP after mastectomy - please call to be sure she is OK and not bleeding.  She was seen by ED PA and I didn't find out till she left

## 2011-08-18 NOTE — Telephone Encounter (Signed)
Called to check on patient, she had a clot in her drain and it would not drain. They changed this out at the ER and everything is working okay now. Appt made with Dr Corliss Skains on Thursday.

## 2011-08-21 ENCOUNTER — Encounter (INDEPENDENT_AMBULATORY_CARE_PROVIDER_SITE_OTHER): Payer: Self-pay | Admitting: Surgery

## 2011-08-21 ENCOUNTER — Ambulatory Visit (INDEPENDENT_AMBULATORY_CARE_PROVIDER_SITE_OTHER): Payer: Medicare Other | Admitting: Surgery

## 2011-08-21 VITALS — BP 126/64 | HR 66 | Temp 97.6°F | Resp 18 | Ht 62.0 in | Wt 288.4 lb

## 2011-08-21 DIAGNOSIS — C50319 Malignant neoplasm of lower-inner quadrant of unspecified female breast: Secondary | ICD-10-CM

## 2011-08-21 NOTE — Progress Notes (Signed)
This is a postop patient of Dr. Jamey Ripa who underwent a right total mastectomy and sentinel lymph node biopsy on 3/21. She was discharged home on the next day. She had a visit to the emergency department when one of her drains was clogged. This was fixed in the emergency department. She reports some muscle stiffness in the right side of her neck intermittently but I believe that this might be positional. She also reports some swelling in the upper part of her right chest. However her breast binder is not correctly positioned and seems to be pushing up some of the remaining subcutaneous adipose tissue so it looks swollen. However when the breast binder is removed the infra-clavicular areas seem symmetrical both sides.  Drain #1 is putting out 15 cc or less each day. The drainage is serosanguineous. We removed this without difficulty.  Drain #2 is still putting out about 50 cc each day. He drainage is also serosanguineous. We left the drain in place.  The incision is healing well with no sign of infection. It is sealed with Dermabond with no drainage. Minimal bruising. The skin flaps appeared viable.  The patient has an appointment to follow up with Dr. Jamey Ripa on Monday. She will continue recording the drain output from #2.  Wilmon Arms. Corliss Skains, MD, [facility]  08/21/2011 11:20 AM

## 2011-08-25 ENCOUNTER — Encounter (INDEPENDENT_AMBULATORY_CARE_PROVIDER_SITE_OTHER): Payer: Self-pay | Admitting: Surgery

## 2011-08-25 ENCOUNTER — Ambulatory Visit (INDEPENDENT_AMBULATORY_CARE_PROVIDER_SITE_OTHER): Payer: Medicare Other | Admitting: Surgery

## 2011-08-25 VITALS — BP 126/81 | HR 78 | Temp 98.6°F | Resp 20 | Ht 62.0 in | Wt 287.2 lb

## 2011-08-25 DIAGNOSIS — C50319 Malignant neoplasm of lower-inner quadrant of unspecified female breast: Secondary | ICD-10-CM

## 2011-08-25 MED ORDER — OXYCODONE HCL 5 MG PO TABS: 5.0000 mg | ORAL_TABLET | ORAL | Status: AC | PRN

## 2011-08-25 NOTE — Progress Notes (Signed)
Addended by: Currie Paris on: 08/25/2011 12:36 PM   Modules accepted: Orders

## 2011-08-25 NOTE — Progress Notes (Signed)
[name]    [id number] 08/25/2011    01/18/1951   CC: Post op MRM  HPI: The patient returns for post op follow-up. She underwent a Right MRM on 3/21. Over all she feels that she is doing well. Still draining about 50 cc/day  PE: The incision is healing nicely and there is no evidence of infection. She may have a hematoma over the lateral superior flap, but this may also just be excess fatty tissue  The drains are clear.  DATA REVIEWED: Pathology report showed two cancers, 1/15 nodes +. Discussed with her.  IMPRESSION: Patient doing well. Drain left in situ  PLAN: Her next visit will be Friday.

## 2011-08-29 ENCOUNTER — Ambulatory Visit (INDEPENDENT_AMBULATORY_CARE_PROVIDER_SITE_OTHER): Payer: Medicare Other | Admitting: Surgery

## 2011-08-29 ENCOUNTER — Encounter (INDEPENDENT_AMBULATORY_CARE_PROVIDER_SITE_OTHER): Payer: Self-pay | Admitting: Surgery

## 2011-08-29 VITALS — BP 138/88 | HR 80 | Resp 20 | Ht 62.0 in | Wt 288.0 lb

## 2011-08-29 DIAGNOSIS — Z9889 Other specified postprocedural states: Secondary | ICD-10-CM

## 2011-08-29 NOTE — Progress Notes (Signed)
[name]    [id number] 08/29/2011    [birth year]   CC: Post op MRM  HPI: The patient returns for post op follow-up. She underwent a Right MRM on 3/21. Over all she feels that she is doing well. Draining about 17cc/day  PE: The incision is healing nicely and there is no evidence of infection. She may have a hematoma over the lateral superior flap, but this may also just be excess fatty tissue  The drainis clear.  DATA REVIEWED: No new data IMPRESSION: Patient doing well. Drain left in situ  PLAN: Her next visit will be Thursday.

## 2011-09-04 ENCOUNTER — Ambulatory Visit (INDEPENDENT_AMBULATORY_CARE_PROVIDER_SITE_OTHER): Payer: Medicare Other | Admitting: Surgery

## 2011-09-04 ENCOUNTER — Ambulatory Visit (HOSPITAL_BASED_OUTPATIENT_CLINIC_OR_DEPARTMENT_OTHER): Payer: Medicare Other | Admitting: Lab

## 2011-09-04 ENCOUNTER — Ambulatory Visit (HOSPITAL_BASED_OUTPATIENT_CLINIC_OR_DEPARTMENT_OTHER): Payer: Medicare Other | Admitting: Oncology

## 2011-09-04 ENCOUNTER — Encounter: Payer: Self-pay | Admitting: *Deleted

## 2011-09-04 ENCOUNTER — Encounter: Payer: Self-pay | Admitting: Oncology

## 2011-09-04 VITALS — BP 118/78 | HR 100 | Temp 97.0°F | Resp 20 | Ht 62.0 in | Wt 287.2 lb

## 2011-09-04 VITALS — BP 124/77 | HR 73 | Temp 97.7°F | Ht 62.0 in | Wt 288.5 lb

## 2011-09-04 DIAGNOSIS — Z170 Estrogen receptor positive status [ER+]: Secondary | ICD-10-CM

## 2011-09-04 DIAGNOSIS — C50319 Malignant neoplasm of lower-inner quadrant of unspecified female breast: Secondary | ICD-10-CM

## 2011-09-04 DIAGNOSIS — Z9889 Other specified postprocedural states: Secondary | ICD-10-CM

## 2011-09-04 LAB — CBC WITH DIFFERENTIAL/PLATELET
Basophils Absolute: 0.1 10*3/uL (ref 0.0–0.1)
Eosinophils Absolute: 0.4 10*3/uL (ref 0.0–0.5)
HCT: 34.1 % — ABNORMAL LOW (ref 34.8–46.6)
HGB: 11.4 g/dL — ABNORMAL LOW (ref 11.6–15.9)
LYMPH%: 37.5 % (ref 14.0–49.7)
MCV: 89.6 fL (ref 79.5–101.0)
MONO#: 0.4 10*3/uL (ref 0.1–0.9)
NEUT#: 3.1 10*3/uL (ref 1.5–6.5)
NEUT%: 48.5 % (ref 38.4–76.8)
Platelets: 189 10*3/uL (ref 145–400)
WBC: 6.4 10*3/uL (ref 3.9–10.3)
nRBC: 0 % (ref 0–0)

## 2011-09-04 LAB — COMPREHENSIVE METABOLIC PANEL
ALT: 8 U/L (ref 0–35)
CO2: 34 mEq/L — ABNORMAL HIGH (ref 19–32)
Calcium: 9.9 mg/dL (ref 8.4–10.5)
Chloride: 97 mEq/L (ref 96–112)
Creatinine, Ser: 0.88 mg/dL (ref 0.50–1.10)
Glucose, Bld: 110 mg/dL — ABNORMAL HIGH (ref 70–99)

## 2011-09-04 NOTE — Patient Instructions (Signed)
See me next week. Go to the ABC class:      [name]  is OK to attend the ABC Class Currie Paris, MD, [facility], [place] [phone] 09/04/2011 9:03 AM

## 2011-09-04 NOTE — Progress Notes (Signed)
Chief complaint: Postop mastectomy  History of present illness: Patient with rectal followup after removal of her drain last week. She overall feels well.  Exam: Incision is healing nicely. There is some fluid present. I aspirated 60 cc.  Impression: Doing well, small seroma  Plan: I will see her back in one week. I gave her an ABC class information sheet

## 2011-09-04 NOTE — Patient Instructions (Signed)
1. We discussed pathology today.  2. We discussed adjuvant chemotherapy. Possible enrollment on clinical trial using chemotherapy  3. Refer to Dr. Michell Heinrich for consultation regarding radiation therapy  4. Refer to Dr. Jamey Ripa for port a cath placemnt.  5. Echocardiogram to evaluate heart function  6. i will see you back in 3 weeks

## 2011-09-05 ENCOUNTER — Telehealth: Payer: Self-pay | Admitting: *Deleted

## 2011-09-05 NOTE — Telephone Encounter (Signed)
made patient appointment for echo on 09-10-2011 at 2:00pm 09-18-2011 dr.khan at 9:00am made patient for dr.murray on 09-16-2011 starting at 7:30am

## 2011-09-08 ENCOUNTER — Encounter: Payer: Self-pay | Admitting: *Deleted

## 2011-09-09 ENCOUNTER — Encounter (HOSPITAL_COMMUNITY): Payer: Self-pay | Admitting: Pharmacy Technician

## 2011-09-09 ENCOUNTER — Encounter (HOSPITAL_COMMUNITY): Payer: Self-pay

## 2011-09-09 ENCOUNTER — Telehealth (INDEPENDENT_AMBULATORY_CARE_PROVIDER_SITE_OTHER): Payer: Self-pay | Admitting: Surgery

## 2011-09-09 ENCOUNTER — Encounter (HOSPITAL_COMMUNITY)
Admission: RE | Admit: 2011-09-09 | Discharge: 2011-09-09 | Disposition: A | Discharge status: Home / Self Care | Payer: Medicare Other | Source: Ambulatory Visit | Attending: Surgery | Admitting: Surgery

## 2011-09-09 ENCOUNTER — Ambulatory Visit (HOSPITAL_COMMUNITY)
Admission: RE | Admit: 2011-09-09 | Discharge: 2011-09-09 | Disposition: A | Discharge status: Home / Self Care | Payer: Medicare Other | Source: Ambulatory Visit | Attending: Surgery | Admitting: Surgery

## 2011-09-09 DIAGNOSIS — Z01812 Encounter for preprocedural laboratory examination: Secondary | ICD-10-CM | POA: Insufficient documentation

## 2011-09-09 DIAGNOSIS — C50919 Malignant neoplasm of unspecified site of unspecified female breast: Secondary | ICD-10-CM | POA: Insufficient documentation

## 2011-09-09 DIAGNOSIS — Z01818 Encounter for other preprocedural examination: Secondary | ICD-10-CM | POA: Insufficient documentation

## 2011-09-09 DIAGNOSIS — I10 Essential (primary) hypertension: Secondary | ICD-10-CM | POA: Insufficient documentation

## 2011-09-09 HISTORY — DX: Encounter for other specified aftercare: Z51.89

## 2011-09-09 HISTORY — DX: Reserved for inherently not codable concepts without codable children: IMO0001

## 2011-09-09 NOTE — Pre-Procedure Instructions (Signed)
12/19/10 EKG on chart  Labs of CBC, CMET, DIFF on chart done 09/04/11  DR Clance  LOV 6/12 EPIC  Dr Eden Emms OV for follow up murmur- 06/17/11 in [facility]  2dECHO 06/24/11 EPIC

## 2011-09-09 NOTE — Patient Instructions (Signed)
20 BRENNYN ORTLIEB  09/09/2011   Your procedure is scheduled on:  09/11/11 1030am-1149am  Report to [facility] at 0800 AM.  Call this number if you have problems the morning of surgery: [phone]   Remember:   Do not eat food:After Midnight.  May have clear liquids:until Midnight .    Take these medicines the morning of surgery with A SIP OF WATER:    Do not wear jewelry, make-up or nail polish.  Do not wear lotions, powders, or perfumes..  Do not shave 48 hours prior to surgery.  Do not bring valuables to the hospital.  Contacts, dentures or bridgework may not be worn into surgery.     Patients discharged the day of surgery will not be allowed to drive home.  Name and phone number of your driver:   Special Instructions: CHG Shower Use Special Wash: 1/2 bottle night before surgery and 1/2 bottle morning of surgery. shower chin to toes with CHG.  Wash face and private parts with regular soap.    Please read over the following fact sheets that you were given: MRSA Information, coughing and deep breathing exercises, leg exercises

## 2011-09-10 ENCOUNTER — Encounter (INDEPENDENT_AMBULATORY_CARE_PROVIDER_SITE_OTHER): Payer: Self-pay | Admitting: Surgery

## 2011-09-10 ENCOUNTER — Ambulatory Visit (INDEPENDENT_AMBULATORY_CARE_PROVIDER_SITE_OTHER): Payer: Medicare Other | Admitting: Surgery

## 2011-09-10 ENCOUNTER — Other Ambulatory Visit (HOSPITAL_COMMUNITY): Payer: Medicare Other

## 2011-09-10 VITALS — BP 123/78 | HR 80 | Temp 97.0°F | Resp 18 | Ht 62.0 in | Wt 290.0 lb

## 2011-09-10 DIAGNOSIS — Z9889 Other specified postprocedural states: Secondary | ICD-10-CM

## 2011-09-10 NOTE — Progress Notes (Signed)
Chief complaint: Postop mastectomy  History of present illness: Patient with followup after removal of her drain two weeks She overall feels well.  Exam: Incision is healing nicely. There is some fluid present. I aspirated 60 cc.  Impression: Doing well, small seroma  Plan: I will see her back in one week. She is scheduled for a port and I have reviewed indications risks and complications with her and answered questions. 

## 2011-09-10 NOTE — Telephone Encounter (Signed)
Will make Dr Jamey Ripa aware to discuss. Patient has appt today.

## 2011-09-11 ENCOUNTER — Ambulatory Visit (HOSPITAL_COMMUNITY): Payer: Medicare Other

## 2011-09-11 ENCOUNTER — Ambulatory Visit (HOSPITAL_COMMUNITY)
Admission: RE | Admit: 2011-09-11 | Discharge: 2011-09-11 | Disposition: A | Discharge status: Home / Self Care | Payer: Medicare Other | Source: Ambulatory Visit | Attending: Surgery | Admitting: Surgery

## 2011-09-11 ENCOUNTER — Encounter (HOSPITAL_COMMUNITY): Payer: Self-pay | Admitting: Anesthesiology

## 2011-09-11 ENCOUNTER — Encounter (HOSPITAL_COMMUNITY)
Admission: RE | Disposition: A | Discharge status: Home / Self Care | Payer: Self-pay | Source: Ambulatory Visit | Attending: Surgery

## 2011-09-11 ENCOUNTER — Ambulatory Visit (HOSPITAL_COMMUNITY): Payer: Medicare Other | Admitting: Anesthesiology

## 2011-09-11 ENCOUNTER — Encounter (HOSPITAL_COMMUNITY): Payer: Self-pay

## 2011-09-11 DIAGNOSIS — C50919 Malignant neoplasm of unspecified site of unspecified female breast: Secondary | ICD-10-CM

## 2011-09-11 DIAGNOSIS — Z8614 Personal history of Methicillin resistant Staphylococcus aureus infection: Secondary | ICD-10-CM | POA: Insufficient documentation

## 2011-09-11 DIAGNOSIS — J45909 Unspecified asthma, uncomplicated: Secondary | ICD-10-CM | POA: Insufficient documentation

## 2011-09-11 DIAGNOSIS — E782 Mixed hyperlipidemia: Secondary | ICD-10-CM

## 2011-09-11 DIAGNOSIS — R011 Cardiac murmur, unspecified: Secondary | ICD-10-CM

## 2011-09-11 DIAGNOSIS — E785 Hyperlipidemia, unspecified: Secondary | ICD-10-CM | POA: Insufficient documentation

## 2011-09-11 DIAGNOSIS — C50319 Malignant neoplasm of lower-inner quadrant of unspecified female breast: Secondary | ICD-10-CM

## 2011-09-11 DIAGNOSIS — G4733 Obstructive sleep apnea (adult) (pediatric): Secondary | ICD-10-CM | POA: Insufficient documentation

## 2011-09-11 DIAGNOSIS — I10 Essential (primary) hypertension: Secondary | ICD-10-CM | POA: Insufficient documentation

## 2011-09-11 DIAGNOSIS — Z79899 Other long term (current) drug therapy: Secondary | ICD-10-CM | POA: Insufficient documentation

## 2011-09-11 HISTORY — PX: PORTACATH PLACEMENT: SHX2246

## 2011-09-11 SURGERY — INSERTION, TUNNELED CENTRAL VENOUS DEVICE, WITH PORT
Anesthesia: Monitor Anesthesia Care | Wound class: Clean Contaminated

## 2011-09-11 MED ORDER — LACTATED RINGERS IV SOLN
INTRAVENOUS | Status: DC
  Administered 2011-09-11: 1000 mL via INTRAVENOUS

## 2011-09-11 MED ORDER — BUPIVACAINE-EPINEPHRINE 0.25% -1:200000 IJ SOLN
INTRAMUSCULAR | Status: DC | PRN
  Administered 2011-09-11: 19 mL

## 2011-09-11 MED ORDER — SODIUM CHLORIDE 0.9 % IR SOLN
Status: DC | PRN
  Administered 2011-09-11: 12:00:00

## 2011-09-11 MED ORDER — HEPARIN SOD (PORK) LOCK FLUSH 100 UNIT/ML IV SOLN
INTRAVENOUS | Status: AC
  Filled 2011-09-11: qty 5

## 2011-09-11 MED ORDER — MIDAZOLAM HCL 5 MG/5ML IJ SOLN
INTRAMUSCULAR | Status: DC | PRN
  Administered 2011-09-11: 2 mg via INTRAVENOUS

## 2011-09-11 MED ORDER — FENTANYL CITRATE 0.05 MG/ML IJ SOLN
INTRAMUSCULAR | Status: DC | PRN
  Administered 2011-09-11 (×4): 25 ug via INTRAVENOUS

## 2011-09-11 MED ORDER — CEFAZOLIN SODIUM-DEXTROSE 2-3 GM-% IV SOLR
INTRAVENOUS | Status: AC
  Filled 2011-09-11: qty 50

## 2011-09-11 MED ORDER — CEFAZOLIN SODIUM-DEXTROSE 2-3 GM-% IV SOLR
2.0000 g | INTRAVENOUS | Status: AC
  Administered 2011-09-11: 2 g via INTRAVENOUS

## 2011-09-11 MED ORDER — BUPIVACAINE-EPINEPHRINE PF 0.25-1:200000 % IJ SOLN
INTRAMUSCULAR | Status: AC
  Filled 2011-09-11: qty 30

## 2011-09-11 MED ORDER — PROPOFOL 10 MG/ML IV EMUL
INTRAVENOUS | Status: DC | PRN
  Administered 2011-09-11: 100 ug/kg/min via INTRAVENOUS

## 2011-09-11 MED ORDER — HEPARIN SOD (PORK) LOCK FLUSH 100 UNIT/ML IV SOLN
INTRAVENOUS | Status: DC | PRN
  Administered 2011-09-11: 500 [IU]

## 2011-09-11 MED ORDER — OXYCODONE-ACETAMINOPHEN 5-325 MG PO TABS: 1.0000 | ORAL_TABLET | ORAL | Status: AC | PRN

## 2011-09-11 MED ORDER — HYDROMORPHONE HCL PF 1 MG/ML IJ SOLN
INTRAMUSCULAR | Status: AC
  Filled 2011-09-11: qty 1

## 2011-09-11 MED ORDER — SODIUM CHLORIDE 0.9 % IR SOLN
Status: DC
  Filled 2011-09-11: qty 1.2

## 2011-09-11 MED ORDER — HYDROMORPHONE HCL PF 1 MG/ML IJ SOLN
0.2500 mg | INTRAMUSCULAR | Status: DC | PRN
  Administered 2011-09-11 (×2): 0.5 mg via INTRAVENOUS

## 2011-09-11 SURGICAL SUPPLY — 50 items
ADH SKN CLS APL DERMABOND .7 (GAUZE/BANDAGES/DRESSINGS) ×2
APL SKNCLS STERI-STRIP NONHPOA (GAUZE/BANDAGES/DRESSINGS) ×1
BAG DECANTER FOR FLEXI CONT (MISCELLANEOUS) ×2 IMPLANT
BENZOIN TINCTURE PRP APPL 2/3 (GAUZE/BANDAGES/DRESSINGS) ×2 IMPLANT
BLADE HEX COATED 2.75 (ELECTRODE) ×2 IMPLANT
BLADE SURG 15 STRL LF DISP TIS (BLADE) ×1 IMPLANT
BLADE SURG 15 STRL SS (BLADE) ×2
CLOTH BEACON ORANGE TIMEOUT ST (SAFETY) ×2 IMPLANT
DECANTER SPIKE VIAL GLASS SM (MISCELLANEOUS) ×2 IMPLANT
DERMABOND ADVANCED (GAUZE/BANDAGES/DRESSINGS) ×2
DERMABOND ADVANCED .7 DNX12 (GAUZE/BANDAGES/DRESSINGS) ×1 IMPLANT
DRAPE C-ARM 42X72 X-RAY (DRAPES) ×2 IMPLANT
DRAPE LAPAROSCOPIC ABDOMINAL (DRAPES) ×2 IMPLANT
DRSG TEGADERM 2-3/8X2-3/4 SM (GAUZE/BANDAGES/DRESSINGS) IMPLANT
DRSG TEGADERM 4X4.75 (GAUZE/BANDAGES/DRESSINGS) IMPLANT
ELECT REM PT RETURN 9FT ADLT (ELECTROSURGICAL) ×2
ELECTRODE REM PT RTRN 9FT ADLT (ELECTROSURGICAL) ×1 IMPLANT
GAUZE SPONGE 2X2 8PLY STRL LF (GAUZE/BANDAGES/DRESSINGS) IMPLANT
GAUZE SPONGE 4X4 16PLY XRAY LF (GAUZE/BANDAGES/DRESSINGS) ×2 IMPLANT
GLOVE BIOGEL PI IND STRL 7.0 (GLOVE) ×1 IMPLANT
GLOVE BIOGEL PI INDICATOR 7.0 (GLOVE) ×1
GLOVE INDICATOR 8.0 STRL GRN (GLOVE) ×4 IMPLANT
GLOVE SS BIOGEL STRL SZ 8 (GLOVE) ×1 IMPLANT
GLOVE SUPERSENSE BIOGEL SZ 8 (GLOVE) ×1
GOWN STRL NON-REIN LRG LVL3 (GOWN DISPOSABLE) ×2 IMPLANT
GOWN STRL REIN XL XLG (GOWN DISPOSABLE) ×4 IMPLANT
KIT BARDPORT ISP 9.6FR (PORTABLE EQUIPMENT SUPPLIES) IMPLANT
KIT BASIN OR (CUSTOM PROCEDURE TRAY) ×2 IMPLANT
KIT POWER CATH 8FR (Catheter) ×2 IMPLANT
NDL HYPO 25X1 1.5 SAFETY (NEEDLE) ×1 IMPLANT
NEEDLE HYPO 25X1 1.5 SAFETY (NEEDLE) ×2 IMPLANT
NS IRRIG 1000ML POUR BTL (IV SOLUTION) ×2 IMPLANT
PACK BASIC VI WITH GOWN DISP (CUSTOM PROCEDURE TRAY) ×2 IMPLANT
PENCIL BUTTON HOLSTER BLD 10FT (ELECTRODE) ×2 IMPLANT
SPONGE GAUZE 2X2 STER 10/PKG (GAUZE/BANDAGES/DRESSINGS)
SPONGE GAUZE 4X4 12PLY (GAUZE/BANDAGES/DRESSINGS) ×4 IMPLANT
STRIP CLOSURE SKIN 1/2X4 (GAUZE/BANDAGES/DRESSINGS) IMPLANT
SUT MNCRL AB 4-0 PS2 18 (SUTURE) ×2 IMPLANT
SUT NOVA NAB DX-16 0-1 5-0 T12 (SUTURE) IMPLANT
SUT PROLENE 2 0 SH DA (SUTURE) ×2 IMPLANT
SUT SILK 2 0 (SUTURE)
SUT SILK 2-0 30XBRD TIE 12 (SUTURE) IMPLANT
SUT VIC AB 3-0 SH 27 (SUTURE) ×2
SUT VIC AB 3-0 SH 27X BRD (SUTURE) ×1 IMPLANT
SUT VIC AB 3-0 SH 27XBRD (SUTURE) IMPLANT
SYR BULB IRRIGATION 50ML (SYRINGE) IMPLANT
SYR CONTROL 10ML LL (SYRINGE) ×2 IMPLANT
SYRINGE 10CC LL (SYRINGE) ×1 IMPLANT
TOWEL OR 17X26 10 PK STRL BLUE (TOWEL DISPOSABLE) ×2 IMPLANT
TOWEL OR NON WOVEN STRL DISP B (DISPOSABLE) ×1 IMPLANT

## 2011-09-11 NOTE — Interval H&P Note (Signed)
History and Physical Interval Note:  09/11/2011 11:02 AM  [name]  has presented today for surgery, with the diagnosis of need for access  The various methods of treatment have been discussed with the patient and family. After consideration of risks, benefits and other options for treatment, the patient has consented to  Procedure(s) (LRB): INSERTION PORT-A-CATH (N/A) as a surgical intervention .  The patients' history has been reviewed, patient examined, no change in status, stable for surgery.  I have reviewed the patients' chart and labs.  Questions were answered to the patient's satisfaction.   Pt seen and agree with Dr Jamey Ripa.  Risks include bleeding ,  Infection,  Pneumothorax,  Hemothorax,  Death,  Fragmentation of cathter,  Embolization,  And more surgery.  She agrees to proceed.  Tynika Luddy A.

## 2011-09-11 NOTE — H&P (View-Only) (Signed)
Chief complaint: Postop mastectomy  History of present illness: Patient with followup after removal of her drain two weeks She overall feels well.  Exam: Incision is healing nicely. There is some fluid present. I aspirated 60 cc.  Impression: Doing well, small seroma  Plan: I will see her back in one week. She is scheduled for a port and I have reviewed indications risks and complications with her and answered questions.

## 2011-09-11 NOTE — Transfer of Care (Signed)
Immediate Anesthesia Transfer of Care Note  Patient: [name]  Procedure(s) Performed: Procedure(s) (LRB): INSERTION PORT-A-CATH (N/A)  Patient Location: PACU  Anesthesia Type: MAC  Level of Consciousness: sedated, patient cooperative and responds to stimulaton  Airway & Oxygen Therapy: Patient Spontanous Breathing and Patient connected to face mask oxgen  Post-op Assessment: Report given to PACU RN and Post -op Vital signs reviewed and stable  Post vital signs: Reviewed and stable  Complications: No apparent anesthesia complications

## 2011-09-11 NOTE — Op Note (Signed)
Preoperative diagnosis: Poor venous access and history of breast cancer  Postop diagnosis: Same  Procedure: Placement of left subclavian 8 French power port a cath with fluoroscopy  Surgeon: Harriette Bouillon M.D.  Anesthesia: MAC with 0.25% bupivacaine local with epinephrine  EBL: Less than 60 cc  Specimen: None  Indications for procedure: The patient presents for port a cath placement for chemotherapy secondary to breast cancer. She underwent mastectomy last month by Dr. Jamey Ripa. He is out of town and asked that I place a Port-A-Cath so she can begin chemotherapy. Risks, benefits and alternative therapies were discussed at the bedside with the patient. Risk of pneumothorax, hemothorax, cardiac injury, mediastinal injury, venous injury, arterial injury, the need for further surgery or procedures, embolization of catheter or wire, death and infection of the device discussed. She agreed to proceed.  Description of procedure: The patient was met in the holding area and questions answered. She was taken back to the operating room and placed supine. Both arms were tucked and a roll was placed under her shoulders. MAC Anesthesia Was Begun. 0.25% Sensorcaine Was on the [facility]. Timeout Was Done after Sterile Prep and Drape. Left Subclavian Region Was Injected with 0.25% Sensorcaine. Left Subclavian Vein Was Entered Using a Needle Return of Dark Nonpulsatile Blood. Wire Was Placed the Needle and by Fluoroscopy Went across the Innominate Vein down into the Right Atrium. The Needle Was Removed. Small Pocket Was Created Using Cautery. 8 [place] Power Port-A-Cath Was Brought onto the [employer] and [employer]. The Catheter Was Tunneled from the Lower Incision to the Wire Insertion Site. With the Patient Trendelenburg the Dilator Was Advanced over the Wire and Moved To And Fro without Resistance. The Introducer Is in Place There the Wire Advancing without Resistance There in the Wire To And Fro. The catheter was  placed and the peel-away sheath the sheath removed without difficulty. Fluoroscopy showed the tip to be the distal superior vena cava without obvious pneumothorax. The catheter drew back easily dark blood and was flushed. 100 units heparin per cc were placed a catheter. The incisions were closed with 3-0 Vicryl. Prior to this, the port was secured to the chest wall with 2-0 Prolene. 4 Monocryl was used to close the skin. Dermabond was applied. All final counts sponge, needle and instruments are found to be correct. The wire was intact. The patient was awoke taken to recovery in satisfactory condition.

## 2011-09-11 NOTE — Anesthesia Preprocedure Evaluation (Signed)
Anesthesia Evaluation  Patient identified by MRN, date of birth, ID band Patient awake    Reviewed: Allergy & Precautions, H&P , NPO status , Patient's Chart, lab work & pertinent test results, reviewed documented beta blocker date and time   Airway Mallampati: II TM Distance: >3 FB Neck ROM: Full    Dental  (+) Dental Advisory Given and Teeth Intact   Pulmonary sleep apnea ,  breath sounds clear to auscultation  + decreased breath sounds      Cardiovascular hypertension, Pt. on medications Rhythm:Regular Rate:Normal  Denies cardiac symptoms   Neuro/Psych negative neurological ROS  negative psych ROS   GI/Hepatic negative GI ROS, Neg liver ROS,   Endo/Other  Morbid obesity  Renal/GU negative Renal ROS  negative genitourinary   Musculoskeletal negative musculoskeletal ROS (+)   Abdominal   Peds negative pediatric ROS (+)  Hematology Anemia   Anesthesia Other Findings Upper front caps  Reproductive/Obstetrics Hx Breast Cancer                           Anesthesia Physical Anesthesia Plan  ASA: III  Anesthesia Plan: MAC   Post-op Pain Management:    Induction: Intravenous  Airway Management Planned: Mask  Additional Equipment:   Intra-op Plan:   Post-operative Plan:   Informed Consent: I have reviewed the patients History and Physical, chart, labs and discussed the procedure including the risks, benefits and alternatives for the proposed anesthesia with the patient or authorized representative who has indicated his/her understanding and acceptance.   Dental advisory given  Plan Discussed with: CRNA and Surgeon  Anesthesia Plan Comments:         Anesthesia Quick Evaluation

## 2011-09-11 NOTE — Interval H&P Note (Signed)
History and Physical Interval Note:  09/11/2011 11:13 AM  [name]  has presented today for surgery, with the diagnosis of need for access  The various methods of treatment have been discussed with the patient and family. After consideration of risks, benefits and other options for treatment, the patient has consented to  Procedure(s) (LRB): INSERTION PORT-A-CATH (N/A) as a surgical intervention .  The patients' history has been reviewed, patient examined, no change in status, stable for surgery.  I have reviewed the patients' chart and labs.  Questions were answered to the patient's satisfaction.     [name]

## 2011-09-11 NOTE — Discharge Instructions (Signed)
Continue bactroban nasal ointment x 8 more treatments ( 2x a day x 4 more days)

## 2011-09-11 NOTE — Anesthesia Postprocedure Evaluation (Signed)
  Anesthesia Post-op Note  Patient: [name]  Procedure(s) Performed: Procedure(s) (LRB): INSERTION PORT-A-CATH (N/A)  Patient Location: PACU  Anesthesia Type: MAC  Level of Consciousness: oriented and sedated  Airway and Oxygen Therapy: Patient Spontanous Breathing  Post-op Pain: mild  Post-op Assessment: Post-op Vital signs reviewed, Patient's Cardiovascular Status Stable, Respiratory Function Stable and Patent Airway  Post-op Vital Signs: stable  Complications: No apparent anesthesia complications

## 2011-09-11 NOTE — H&P (Signed)
[name] is an 61 y.o. female.   Chief Complaint:NEEDS PORT HPI: Pt in need of port for chemotherapy.  Past Medical History  Diagnosis Date  . Hypertension   . Asthma   . Hyperlipemia   . Heart murmur   . Arthritis   . OSA (obstructive sleep apnea)     CPAP settings- ?   . Blood transfusion     hx of last one in 1987  . 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 MRSA (methicillin resistant staph aureus) culture positive     08/2011    Past Surgical History  Procedure Date  . Tubal ligation   . Dilation and curettage of uterus     x3  . Carpal tunnel release   . Hand surgery     2012  . Cholecystectomy 1987    open - Dr Wiliam Ke  . Abdominal hysterectomy   . Mastectomy modified radical 08/14/11    right     Family History  Problem Relation Age of Onset  . Cancer Father    Social History:  reports that she has never smoked. She has never used smokeless tobacco. She reports that she does not drink alcohol or use illicit drugs.  Allergies:  Allergies  Allergen Reactions  . Trandolapril-Verapamil Hcl      lip swelling    Medications Prior to Admission  Medication Dose Route Frequency Provider Last Rate Last Dose  . ceFAZolin (ANCEF) IVPB 2 g/50 mL premix  2 g Intravenous 60 min Pre-Op Tempest Frankland A. Tavion Senkbeil, MD      . heparin 6,000 Units in sodium chloride irrigation 0.9 % 500 mL irrigation   Irrigation To OR Habiba Treloar A. Renn Dirocco, MD      . lactated ringers infusion   Intravenous Continuous Jill Side, MD 125 mL/hr at 09/11/11 1019 1,000 mL at 09/11/11 1019   Medications Prior to Admission  Medication Sig Dispense Refill  . albuterol (PROAIR HFA) 108 (90 BASE) MCG/ACT inhaler Inhale 2 puffs into the lungs every 6 (six) hours as needed. For shortness of breath      . cabergoline (DOSTINEX) 0.5 MG tablet Take 0.25 mg by mouth every Monday, Wednesday, and Friday.       . Fluticasone-Salmeterol (ADVAIR DISKUS) 100-50  MCG/DOSE AEPB Inhale 1 puff into the lungs daily after breakfast.       . furosemide (LASIX) 20 MG tablet Take 20 mg by mouth daily. Pt takes in am      . latanoprost (XALATAN) 0.005 % ophthalmic solution Place 1 drop into both eyes at bedtime.       . Multiple Vitamin (MULITIVITAMIN WITH MINERALS) TABS Take 1 tablet by mouth daily with breakfast.       . simvastatin (ZOCOR) 20 MG tablet Take 20 mg by mouth daily with breakfast.       . valsartan-hydrochlorothiazide (DIOVAN-HCT) 160-25 MG per tablet Take 1 tablet by mouth daily with breakfast.         Results for orders placed during the hospital encounter of 09/09/11 (from the past 48 hour(s))  SURGICAL PCR SCREEN     Status: Abnormal   Collection Time   09/09/11  2:30 PM      Component Value Range Comment   MRSA, PCR NEGATIVE  NEGATIVE     Staphylococcus aureus POSITIVE (*) NEGATIVE     Dg Chest 2 View  09/09/2011  *RADIOLOGY REPORT*  Clinical Data:  preop port placement  CHEST - 2 VIEW  Comparison: None  Findings: Heart size appears normal.  There is no pleural effusion or pulmonary edema.  No airspace consolidation identified.  Mild multilevel spondylosis within the thoracic spine is noted.  IMPRESSION:  1.  No active cardiopulmonary abnormalities  Original Report Authenticated By: Rosealee Albee, M.D.    Review of Systems  Constitutional: Negative.   HENT: Negative.   Eyes: Negative.   Respiratory: Negative.   Cardiovascular: Negative.   Gastrointestinal: Negative.   Genitourinary: Negative.   Musculoskeletal: Negative.   Skin: Negative.   Neurological: Negative.   Endo/Heme/Allergies: Negative.   Psychiatric/Behavioral: Negative.     Blood pressure 120/70, pulse 73, temperature 97.6 F (36.4 C), resp. rate 20, SpO2 98.00%. Physical Exam  Constitutional: She appears well-developed and well-nourished.  HENT:  Head: Normocephalic and atraumatic.  Eyes: EOM are normal. Pupils are equal, round, and reactive to light.    Neck: Normal range of motion. Neck supple.  Cardiovascular: Normal rate and regular rhythm.   Respiratory:       Post op mastectomy  Skin: Skin is warm and dry.     Assessment/Plan Breast cancer and poor venous access. Port placement.  D/W pt.  Risks outlined in previous note.  Agrees to proceed.The procedure has been discussed with the patient.  Alternative therapies have been discussed with the patient.  Operative risks include bleeding,  Infection,  Organ injury,  Nerve injury,  Blood vessel injury,  DVT,  Pulmonary embolism,  Death,  And possible reoperation.  Medical management risks include worsening of present situation.  The success of the procedure is 50 -90 % at treating patients symptoms.  The patient understands and agrees to proceed.  Darletta Noblett A. 09/11/2011, 11:10 AM

## 2011-09-12 ENCOUNTER — Encounter: Payer: Self-pay | Admitting: Radiation Oncology

## 2011-09-12 NOTE — Progress Notes (Signed)
61 year old female. Married with 2 children. Worked as a Neurosurgeon.  Right modified mastectomy with axillary sentinel lymph node identification done 07/30/11. 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   Seen in [facility] by Dr. Dayton Scrape on 07/30/11.  [id number] hcl causes lip swelling No hx of radiation therapy No indication of a pacemaker

## 2011-09-15 NOTE — Progress Notes (Signed)
OFFICE PROGRESS NOTE  CC  [name],[name], MD, MD [address] [place] [address] Dr. Chipper Herb Dr. 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 Invasive ductal carcinoma (T3 NX MX).   #2 Patient has gone on to have right simple mastectomy with the final pathology revealing invasive and in situ ductal carcinoma. 2 nodules were seen 3 cm and 2.2 cm margins were negative there was focal lymphovascular space involvement by tumor. 1/15 sentinel node was positive for metastatic disease.  #3 the tumor was estrogen receptor +100% progesterone receptor +100% and HER-2/neu negative proliferation marker 53%. Final pathologic staging was P. T2 N1 A. MX.  CURRENT THERAPY:Patient is being seen for discussion of adjuvant treatment. She is eligible for NSABP B. 49 clinical trial  INTERVAL HISTORY: [name] 61 y.o. female returns for Followup visit post mastectomy. She and I discussed her final pathology. Clinically she seems to be doing well she denies any fevers chills night sweats headaches shortness of breath chest pains palpitations. She does have pain at the surgical site.Remainder of the 10 point review of systems is negative.  MEDICAL HISTORY: Past Medical History  Diagnosis Date  . Hypertension   . Asthma   . Hyperlipemia   . Heart murmur   . Arthritis   . OSA (obstructive sleep apnea)     CPAP settings- ?   . Blood transfusion     hx of last one in 1987  . 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 MRSA (methicillin resistant staph aureus) culture positive     08/2011    ALLERGIES:  is allergic to trandolapril-verapamil hcl.  MEDICATIONS:  Current Outpatient Prescriptions    Medication Sig Dispense Refill  . albuterol (PROAIR HFA) 108 (90 BASE) MCG/ACT inhaler Inhale 2 puffs into the lungs every 6 (six) hours as needed. For shortness of breath      . cabergoline (DOSTINEX) 0.5 MG tablet Take 0.25 mg by mouth every Monday, Wednesday, and Friday.       . Fluticasone-Salmeterol (ADVAIR DISKUS) 100-50 MCG/DOSE AEPB Inhale 1 puff into the lungs daily after breakfast.       . furosemide (LASIX) 20 MG tablet Take 20 mg by mouth daily. Pt takes in am      . latanoprost (XALATAN) 0.005 % ophthalmic solution Place 1 drop into both eyes at bedtime.       . Multiple Vitamin (MULITIVITAMIN WITH MINERALS) TABS Take 1 tablet by mouth daily with breakfast.       . simvastatin (ZOCOR) 20 MG tablet Take 20 mg by mouth daily with breakfast.       . valsartan-hydrochlorothiazide (DIOVAN-HCT) 160-25 MG per tablet Take 1 tablet by mouth daily with breakfast.       . oxyCODONE-acetaminophen (ROXICET) 5-325 MG per tablet Take 1 tablet by mouth every 4 (four) hours as needed for pain.  30 tablet  0    SURGICAL HISTORY:  Past Surgical History  Procedure Date  . Tubal ligation   . Dilation and curettage of uterus     x3  . Carpal tunnel release   . Hand surgery     2012  . Cholecystectomy 1987    open - Dr Wiliam Ke  .  Abdominal hysterectomy   . Mastectomy modified radical 08/14/11    right     REVIEW OF SYSTEMS:  Pertinent items are noted in HPI.   PHYSICAL EXAMINATION: General appearance: alert, cooperative and appears stated age Neck: no adenopathy, no carotid bruit, no JVD, supple, symmetrical, trachea midline and thyroid not enlarged, symmetric, no tenderness/mass/nodules Lymph nodes: Cervical, supraclavicular, and axillary nodes normal. Resp: clear to auscultation bilaterally and normal percussion bilaterally Back: symmetric, no curvature. ROM normal. No CVA tenderness. Cardio: regular rate and rhythm, S1, S2 normal, no murmur, click, rub or gallop GI: soft, non-tender;  bowel sounds normal; no masses,  no organomegaly Extremities: extremities normal, atraumatic, no cyanosis or edema Neurologic: Grossly normal Right mastectomy scar is healing. Left breast no masses or nipple discharge. ECOG PERFORMANCE STATUS: 1 - Symptomatic but completely ambulatory  Blood pressure 124/77, pulse 73, temperature 97.7 F (36.5 C), temperature source Oral, height 5\' 2"  (1.575 m), weight 288 lb 8 oz (130.863 kg).  LABORATORY DATA: Lab Results  Component Value Date   WBC 6.4 09/04/2011   HGB 11.4* 09/04/2011   HCT 34.1* 09/04/2011   MCV 89.6 09/04/2011   PLT 189 09/04/2011      Chemistry      Component Value Date/Time   NA 139 09/04/2011 1446   K 3.5 09/04/2011 1446   CL 97 09/04/2011 1446   CO2 34* 09/04/2011 1446   BUN 12 09/04/2011 1446   CREATININE 0.88 09/04/2011 1446      Component Value Date/Time   CALCIUM 9.9 09/04/2011 1446   ALKPHOS 80 09/04/2011 1446   AST 11 09/04/2011 1446   ALT 8 09/04/2011 1446   BILITOT 1.1 09/04/2011 1446       RADIOGRAPHIC STUDIES:  Dg Chest 2 View  09/09/2011  *RADIOLOGY REPORT*  Clinical Data:  preop port placement  CHEST - 2 VIEW  Comparison: None  Findings: Heart size appears normal.  There is no pleural effusion or pulmonary edema.  No airspace consolidation identified.  Mild multilevel spondylosis within the thoracic spine is noted.  IMPRESSION:  1.  No active cardiopulmonary abnormalities  Original Report Authenticated By: Rosealee Albee, M.D.   Dg Chest Port 1 View  09/11/2011  *RADIOLOGY REPORT*  Clinical Data: Left Port-A-Cath placement.  PORTABLE CHEST - 1 VIEW  Comparison: 09/09/2011.  Findings: Trachea is midline.  Heart size stable.  Left subclavian power port tip projects over the SVC.  No pneumothorax.  Lungs are somewhat low in volume with vascular crowding.  No pleural fluid.  IMPRESSION:  1.  Left subclavian power port placement without pneumothorax. 2.  Mild vascular crowding without acute finding.  Original Report  Authenticated By: Reyes Ivan, M.D.    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 The final pathology revealed multifocal disease the largest tumor was 3 cm with a second tumor measuring 2.2 cm with focal lymphovascular space involvement. One of 15 lymph nodes were positive for metastatic disease. Postoperatively patient is doing well. The tumor was ER +100% PR +100% HER-2/neu negative with a proliferation marker 53%.  #2 the patient is a candidate for adjuvant chemotherapy was discussed the risks and benefits of this. She also would be eligible for NSABP B. 49 clinical trial and I discussed this with the patient today.   PLAN:   #1 we extensively discussed the clinical study. Patient and her husband were  in favor of this. They met with the research nurses.  #2 my hope is to begin her on adjuvant chemotherapy in the next few weeks as soon as eligibility criteria are met.   All questions were answered. The patient knows to call the clinic with any problems, questions or concerns. We can certainly see the patient much sooner if necessary.  I spent 30 minutes counseling the patient face to face. The total time spent in the appointment was 30 minutes.    Drue Second, MD Medical/Oncology [facility] [phone] (beeper) [phone] (Office)  09/15/2011, 11:26 AM

## 2011-09-16 ENCOUNTER — Encounter: Payer: Self-pay | Admitting: Radiation Oncology

## 2011-09-16 ENCOUNTER — Ambulatory Visit
Admission: RE | Admit: 2011-09-16 | Discharge: 2011-09-16 | Disposition: A | Discharge status: Home / Self Care | Payer: Medicare Other | Source: Ambulatory Visit | Attending: Radiation Oncology | Admitting: Radiation Oncology

## 2011-09-16 VITALS — BP 118/76 | HR 66 | Temp 98.4°F | Resp 20 | Ht 62.0 in | Wt 289.4 lb

## 2011-09-16 DIAGNOSIS — C50319 Malignant neoplasm of lower-inner quadrant of unspecified female breast: Secondary | ICD-10-CM

## 2011-09-16 NOTE — Progress Notes (Signed)
Please see the Nurse Progress Note in the MD Initial Consult Encounter for this patient. 

## 2011-09-16 NOTE — Progress Notes (Signed)
08/14/11 right simple mastectomy  Oxycodone prn for right breast soreness; pt takes approx once daily. Porta cath placed 09/11/11.

## 2011-09-16 NOTE — Progress Notes (Signed)
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 Hashimi visits today for review and discussion of post mastectomy radiation therapy. I first saw the patient on 07/30/2011 at the [facility] at which time she presented with multifocal invasive ductal/DCIS of the right breast. She was found to have 3 masses in the lower inner quadrant of the right breast with a suspicious lymph node. Biopsies of the masses showed a similar invasive ductal/DCIS. She underwent a right mastectomy and sentinel lymph node biopsy. She is found to have metastatic carcinoma in her sentinel lymph node and underwent a completion axillary node dissection. On review of her pathology she was found to have 2 tumor nodules one measuring 2.0 and the other 2.2 cm. These nodules represented both invasive and DCIS with focal LV I. Margins were widely negative. The sentinel lymph node did show extracapsular extension. The tumor was strongly ER/PR +100% with an elevated Ki-67 of 53%. The tumor was HER-2/neu negative. She is doing well postoperatively and she sees Dr. Welton Flakes later this week for scheduling of her chemotherapy.  Physical examination: Alert and oriented. Vital signs: Wt Readings from Last 3 Encounters:  09/16/11 289 lb 6.4 oz (131.271 kg)  09/10/11 290 lb (131.543 kg)  09/09/11 287 lb 1.6 oz (130.228 kg)   Temp Readings from Last 3 Encounters:  09/16/11 98.4 F (36.9 C) Oral  09/11/11 96.9 F (36.1 C)   09/11/11 96.9 F (36.1 C)    BP Readings from Last 3 Encounters:  09/16/11 118/76  09/11/11 118/79  09/11/11 118/79   Pulse Readings from Last 3 Encounters:  09/16/11 66  09/11/11 50  09/11/11 50   Head and neck examination: Grossly unremarkable. Nodes: There is no palpable cervical, supraclavicular, or axillary lymphadenopathy. Chest: Left upper anterior chest Port-A-Cath. Status post right mastectomy with satisfactory healing. There is a Band-Aid just inferior and to the  right upper mastectomy scar which is not removed. This is apparently over her drain site. Left breast without masses or lesions. Lungs clear. Heart: Regular in rhythm. Abdomen without hepatomegaly. Extremities: There is 1+ right upper extremity lymphedema.  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 She is doing well postoperatively although I believe she is developing mild lymphedema of the right upper extremity. We discussed the risk for a local regional failure which is related to stage (tumor size/nodal involvement), presence of LV I./nodal extracapsular extension, tumor differentiation/grade, hormone receptor status, and age. Based on these predictors, I would estimate that her risk for local regional failure would be in the vicinity of 10-15%, and thus I would offer her postmastectomy radiation. We discussed the potential acute and late toxicities of radiation therapy including worsening of her lymphedema and affecting her choices for right breast reconstruction. She her husband tell me that breast reconstruction is not a major concern at this time. She is inclined to proceed with postmastectomy radiation therapy following completion of chemotherapy.  Plan: As discussed above. I asked that Dr. Welton Flakes to contact me near completion of chemotherapy so I can then discuss post mastectomy radiation therapy in greater detail and get her scheduled for treatment planning. The patient understands that would not be until later this summer. She will see Dr. Jamey Ripa next week he may send her to the lymphedema clinic for evaluation and management.  30 minutes was spent face-to-face with the patient, primarily counseling the patient.

## 2011-09-18 ENCOUNTER — Other Ambulatory Visit: Payer: Medicare Other

## 2011-09-18 ENCOUNTER — Other Ambulatory Visit: Payer: Self-pay | Admitting: Medical Oncology

## 2011-09-18 ENCOUNTER — Ambulatory Visit (HOSPITAL_BASED_OUTPATIENT_CLINIC_OR_DEPARTMENT_OTHER): Payer: Medicare Other | Admitting: Oncology

## 2011-09-18 ENCOUNTER — Encounter: Payer: Self-pay | Admitting: *Deleted

## 2011-09-18 ENCOUNTER — Other Ambulatory Visit: Payer: Self-pay | Admitting: *Deleted

## 2011-09-18 ENCOUNTER — Telehealth: Payer: Self-pay | Admitting: Oncology

## 2011-09-18 ENCOUNTER — Encounter: Payer: Self-pay | Admitting: Oncology

## 2011-09-18 VITALS — BP 147/79 | HR 84 | Temp 98.3°F | Ht 62.0 in | Wt 287.7 lb

## 2011-09-18 DIAGNOSIS — Z006 Encounter for examination for normal comparison and control in clinical research program: Secondary | ICD-10-CM

## 2011-09-18 DIAGNOSIS — Z170 Estrogen receptor positive status [ER+]: Secondary | ICD-10-CM

## 2011-09-18 DIAGNOSIS — Z9010 Acquired absence of unspecified breast and nipple: Secondary | ICD-10-CM

## 2011-09-18 DIAGNOSIS — C50319 Malignant neoplasm of lower-inner quadrant of unspecified female breast: Secondary | ICD-10-CM

## 2011-09-18 MED ORDER — ONDANSETRON HCL 8 MG PO TABS: ORAL_TABLET | ORAL | Status: DC

## 2011-09-18 MED ORDER — DEXAMETHASONE 4 MG PO TABS: ORAL_TABLET | ORAL | Status: DC

## 2011-09-18 MED ORDER — LORAZEPAM 0.5 MG PO TABS: 0.5000 mg | ORAL_TABLET | Freq: Four times a day (QID) | ORAL | Status: DC | PRN

## 2011-09-18 MED ORDER — PROCHLORPERAZINE 25 MG RE SUPP: 25.0000 mg | Freq: Two times a day (BID) | RECTAL | Status: DC | PRN

## 2011-09-18 MED ORDER — LIDOCAINE-PRILOCAINE 2.5-2.5 % EX CREA: TOPICAL_CREAM | CUTANEOUS | Status: DC | PRN

## 2011-09-18 MED ORDER — PROCHLORPERAZINE MALEATE 10 MG PO TABS: 10.0000 mg | ORAL_TABLET | Freq: Four times a day (QID) | ORAL | Status: DC | PRN

## 2011-09-18 NOTE — Progress Notes (Signed)
OFFICE PROGRESS NOTE  CC  [name],[name], MD, MD [address] [place] [address] Dr. Chipper Herb Dr. 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 Invasive ductal carcinoma (T3 NX MX).   #2 Patient has gone on to have right simple mastectomy with the final pathology revealing invasive and in situ ductal carcinoma. 2 nodules were seen 3 cm and 2.2 cm margins were negative there was focal lymphovascular space involvement by tumor. 1/15 sentinel node was positive for metastatic disease.  #3 the tumor was estrogen receptor +100% progesterone receptor +100% and HER-2/neu negative proliferation marker 53%. Final pathologic staging was P. T2 N1 A. MX.  CURRENT THERAPY:Randomized to NSABP B-49 to begin TC q 3 weeks starting 09/22/11  INTERVAL HISTORY: [name] 61 y.o. female returns for Followup visit. Overall she is doing well. She has agreed to enroll on NSABP B. 49 clinical trial. She has been randomized to Taxotere Cytoxan. Her treatment will begin on 09/22/2011. She denies any fevers chills night sweats headaches shortness of breath chest pains palpitations she has no myalgias or arthralgias. She still is a little bit sore in the right mastectomy site but there is no evidence of any kind of infections. Remainder of the 10 point review of systems is negative.  MEDICAL HISTORY: Past Medical History  Diagnosis Date  . Hypertension   . Asthma   . Hyperlipemia   . Heart murmur   . Arthritis   . OSA (obstructive sleep apnea)     CPAP settings- ?   . Blood transfusion     hx of last one in 1987  . 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 MRSA (methicillin resistant staph aureus) culture  positive     08/2011    ALLERGIES:  is allergic to trandolapril-verapamil hcl.  MEDICATIONS:  Current Outpatient Prescriptions  Medication Sig Dispense Refill  . albuterol (PROAIR HFA) 108 (90 BASE) MCG/ACT inhaler Inhale 2 puffs into the lungs every 6 (six) hours as needed. For shortness of breath      . cabergoline (DOSTINEX) 0.5 MG tablet Take 0.25 mg by mouth every Monday, Wednesday, and Friday.       . Fluticasone-Salmeterol (ADVAIR DISKUS) 100-50 MCG/DOSE AEPB Inhale 1 puff into the lungs daily after breakfast.       . furosemide (LASIX) 20 MG tablet Take 20 mg by mouth daily. Pt takes in am      . latanoprost (XALATAN) 0.005 % ophthalmic solution Place 1 drop into both eyes at bedtime.       . Multiple Vitamin (MULITIVITAMIN WITH MINERALS) TABS Take 1 tablet by mouth daily with breakfast.       . oxyCODONE-acetaminophen (ROXICET) 5-325 MG per tablet Take 1 tablet by mouth every 4 (four) hours as needed for pain.  30 tablet  0  . simvastatin (ZOCOR) 20 MG tablet Take 20 mg by mouth daily with breakfast.       . valsartan-hydrochlorothiazide (DIOVAN-HCT) 160-25 MG per tablet Take 1 tablet by mouth daily with breakfast.         SURGICAL HISTORY:  Past Surgical History  Procedure Date  . Tubal ligation   . Dilation and curettage of uterus  x3  . Carpal tunnel release   . Hand surgery     2012  . Cholecystectomy 1987    open - Dr Wiliam Ke  . Mastectomy modified radical 08/14/11    right , ER/PR +, HER2 -  . Abdominal hysterectomy 2007    TAH/BSO    REVIEW OF SYSTEMS:  Pertinent items are noted in HPI.   PHYSICAL EXAMINATION: General appearance: alert, cooperative and appears stated age Neck: no adenopathy, no carotid bruit, no JVD, supple, symmetrical, trachea midline and thyroid not enlarged, symmetric, no tenderness/mass/nodules Lymph nodes: Cervical, supraclavicular, and axillary nodes normal. Resp: clear to auscultation bilaterally and normal percussion  bilaterally Back: symmetric, no curvature. ROM normal. No CVA tenderness. Cardio: regular rate and rhythm, S1, S2 normal, no murmur, click, rub or gallop GI: soft, non-tender; bowel sounds normal; no masses,  no organomegaly Extremities: extremities normal, atraumatic, no cyanosis or edema Neurologic: Grossly normal Right mastectomy scar is healing. Left breast no masses or nipple discharge. ECOG PERFORMANCE STATUS: 1 - Symptomatic but completely ambulatory  Blood pressure 147/79, pulse 84, temperature 98.3 F (36.8 C), temperature source Oral, height 5\' 2"  (1.575 m), weight 287 lb 11.2 oz (130.5 kg).  LABORATORY DATA: Lab Results  Component Value Date   WBC 6.4 09/04/2011   HGB 11.4* 09/04/2011   HCT 34.1* 09/04/2011   MCV 89.6 09/04/2011   PLT 189 09/04/2011      Chemistry      Component Value Date/Time   NA 139 09/04/2011 1446   K 3.5 09/04/2011 1446   CL 97 09/04/2011 1446   CO2 34* 09/04/2011 1446   BUN 12 09/04/2011 1446   CREATININE 0.88 09/04/2011 1446      Component Value Date/Time   CALCIUM 9.9 09/04/2011 1446   ALKPHOS 80 09/04/2011 1446   AST 11 09/04/2011 1446   ALT 8 09/04/2011 1446   BILITOT 1.1 09/04/2011 1446       RADIOGRAPHIC STUDIES:  Dg Chest 2 View  09/09/2011  *RADIOLOGY REPORT*  Clinical Data:  preop port placement  CHEST - 2 VIEW  Comparison: None  Findings: Heart size appears normal.  There is no pleural effusion or pulmonary edema.  No airspace consolidation identified.  Mild multilevel spondylosis within the thoracic spine is noted.  IMPRESSION:  1.  No active cardiopulmonary abnormalities  Original Report Authenticated By: Rosealee Albee, M.D.   Dg Chest Port 1 View  09/11/2011  *RADIOLOGY REPORT*  Clinical Data: Left Port-A-Cath placement.  PORTABLE CHEST - 1 VIEW  Comparison: 09/09/2011.  Findings: Trachea is midline.  Heart size stable.  Left subclavian power port tip projects over the SVC.  No pneumothorax.  Lungs are somewhat low in volume with  vascular crowding.  No pleural fluid.  IMPRESSION:  1.  Left subclavian power port placement without pneumothorax. 2.  Mild vascular crowding without acute finding.  Original Report Authenticated By: Reyes Ivan, M.D.    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 The final pathology revealed multifocal disease the largest tumor was 3 cm with a second tumor measuring 2.2 cm with focal lymphovascular space involvement. One of 15 lymph nodes were positive for metastatic disease. Postoperatively patient is doing well. The tumor was ER +100% PR +100% HER-2/neu negative with a proliferation marker 53%.  #2 It has been enrolled on clinical trial NSABP B. 49 utilizing Taxotere Cytoxan x6 cycles  versus an anthracycline. Patient was randomized to Taxotere Cytoxan every 3 weeks for a total of 6 cycles. We have discussed this with the patient. She met with our clinical research nurse Dondra Spry lot. Risks and benefits of treatment have been discussed.  PLAN:  #1 patient will begin her Taxotere Cytoxan every 3 weeks beginning on 09/22/2011. Risks and benefits of treatment were discussed with the patient. She will have her chemotherapy teaching class today at 12:30.  #2 for nausea and antiemetics have been sent to her pharmacy including Zofran Compazine as well as Ativan. She will also received dexamethasone prescription and I have instructed on how to take gait. Written information was also given to her.  #3 patient will be seen back on 09/22/2010 to see me then treatment. All of her treatment plan is in EPIC and appointments have been requested  All questions were answered. The patient knows to call the clinic with any problems, questions or concerns. We can certainly see the patient much sooner if necessary.  I spent 30 minutes counseling the patient face to face. The total time spent in  the appointment was 30 minutes.    Drue Second, MD Medical/Oncology [facility] [phone] (beeper) [phone] (Office)  09/18/2011, 9:41 AM

## 2011-09-18 NOTE — Patient Instructions (Signed)
1. Chemotherapy will start on 4/29, you will have labs then see Dr. Welton Flakes and then get chemotherapy  2. Begin your dexamethasone 2 pills twice a day starting the day before, day of and day after chemotherapy  3. Take the anti-nausea medications as written on the separate sheet that I gave you  4. I will see you back on 5/6 in follow up with labs as well.  5. Call with any problems or questions at [phone] and ask for triage or Dr. Rosine Beat nurse.

## 2011-09-18 NOTE — Telephone Encounter (Signed)
gve the pt her April-July 2013 appt calendar. S/w gail the research nuse who stated that the pt does not need to see dr Welton Flakes again on 09/22/2011. Pt was just seen in the office on 09/18/2011. Cancelled md and lab appt per gail in research.

## 2011-09-18 NOTE — Telephone Encounter (Signed)
Pt came back from [facility] to get appts and was given schedule for April thru august. appt completed by crystal.

## 2011-09-19 ENCOUNTER — Encounter (INDEPENDENT_AMBULATORY_CARE_PROVIDER_SITE_OTHER): Payer: Self-pay | Admitting: Surgery

## 2011-09-19 ENCOUNTER — Ambulatory Visit (INDEPENDENT_AMBULATORY_CARE_PROVIDER_SITE_OTHER): Payer: Medicare Other | Admitting: Surgery

## 2011-09-19 VITALS — BP 132/74 | HR 74 | Temp 97.6°F | Resp 18 | Ht 62.0 in | Wt 288.2 lb

## 2011-09-19 DIAGNOSIS — C50319 Malignant neoplasm of lower-inner quadrant of unspecified female breast: Secondary | ICD-10-CM

## 2011-09-19 NOTE — Progress Notes (Signed)
Chief complaint: Postop right modified radical mastectomy done on March 21  History of present illness: This patient is back for reevaluation of her incision. She's had a persistent seroma. She had a Port-A-Cath placed last week. She tolerated that well. She has no symptoms regarding her incisions.  Exam: Incision is healing nicely. There is a recurrent seroma and I aspirated 10 cc today with complete resolution. The Port-A-Cath site is healing nicely. Impression doing well.  Plan: We'll have her come back in a week for recheck I think this will need to be aspirated one more time. Has given her the form and encouraged her to go to the ABC class.

## 2011-09-19 NOTE — Patient Instructions (Signed)
Go to the ABC class      [name]  is OK to attend the ABC Class Currie Paris, MD, [facility], [place] [phone] 09/19/2011 9:23 AM

## 2011-09-22 ENCOUNTER — Other Ambulatory Visit: Payer: Medicare Other | Admitting: Lab

## 2011-09-22 ENCOUNTER — Encounter (HOSPITAL_COMMUNITY): Payer: Self-pay | Admitting: Surgery

## 2011-09-22 ENCOUNTER — Ambulatory Visit: Payer: Medicare Other | Admitting: Oncology

## 2011-09-22 ENCOUNTER — Ambulatory Visit (HOSPITAL_BASED_OUTPATIENT_CLINIC_OR_DEPARTMENT_OTHER): Payer: Medicare Other

## 2011-09-22 ENCOUNTER — Encounter: Payer: Self-pay | Admitting: *Deleted

## 2011-09-22 VITALS — BP 135/81 | HR 60 | Temp 97.9°F

## 2011-09-22 DIAGNOSIS — Z5111 Encounter for antineoplastic chemotherapy: Secondary | ICD-10-CM

## 2011-09-22 DIAGNOSIS — C50319 Malignant neoplasm of lower-inner quadrant of unspecified female breast: Secondary | ICD-10-CM

## 2011-09-22 MED ORDER — SODIUM CHLORIDE 0.9 % IV SOLN
600.0000 mg/m2 | Freq: Once | INTRAVENOUS | Status: AC
  Administered 2011-09-22: 1440 mg via INTRAVENOUS
  Filled 2011-09-22: qty 72

## 2011-09-22 MED ORDER — DEXAMETHASONE SODIUM PHOSPHATE 4 MG/ML IJ SOLN
20.0000 mg | Freq: Once | INTRAMUSCULAR | Status: AC
  Administered 2011-09-22: 20 mg via INTRAVENOUS

## 2011-09-22 MED ORDER — DOCETAXEL CHEMO INJECTION 160 MG/16ML
75.0000 mg/m2 | Freq: Once | INTRAVENOUS | Status: AC
  Administered 2011-09-22: 180 mg via INTRAVENOUS
  Filled 2011-09-22: qty 18

## 2011-09-22 MED ORDER — HEPARIN SOD (PORK) LOCK FLUSH 100 UNIT/ML IV SOLN
500.0000 [IU] | Freq: Once | INTRAVENOUS | Status: AC | PRN
  Administered 2011-09-22: 500 [IU]
  Filled 2011-09-22: qty 5

## 2011-09-22 MED ORDER — ONDANSETRON 16 MG/50ML IVPB (CHCC)
16.0000 mg | Freq: Once | INTRAVENOUS | Status: AC
  Administered 2011-09-22: 16 mg via INTRAVENOUS

## 2011-09-22 MED ORDER — SODIUM CHLORIDE 0.9 % IJ SOLN
10.0000 mL | INTRAMUSCULAR | Status: DC | PRN
Start: 2011-09-22 — End: 2011-09-22
  Administered 2011-09-22: 10 mL
  Filled 2011-09-22: qty 10

## 2011-09-22 MED ORDER — SODIUM CHLORIDE 0.9 % IV SOLN
Freq: Once | INTRAVENOUS | Status: AC
  Administered 2011-09-22: 09:00:00 via INTRAVENOUS

## 2011-09-22 NOTE — Progress Notes (Signed)
Encounter addended by: Agnes Lawrence, RN on: 09/22/2011  9:44 AM<BR>     Documentation filed: Charges VN, Inpatient Patient Education

## 2011-09-22 NOTE — Progress Notes (Signed)
09/22/11 @0820 , NSABP B-49, Cycle 1, Day 1:  Ms. Gilleland into the [facility] for her first treatment with Taxotere/Cytoxan per the B-49 protocol.  She is doing well this morning-only "a little nervous."  She said the chemo class was very enlightening and is ready to proceed.  She confirmed that she has an appointment for Neulasta injection tomorrow.  Spoke with Amy Horton, RN in the infusion area about patient's treatment for today and provided her with a sign that all treatment is per institutional standards.  Confirmed with her the patient did not need labs today (using baseline labs for C1).

## 2011-09-22 NOTE — Patient Instructions (Signed)
[facility] Discharge Instructions for Patients Receiving Chemotherapy  Today you received the following chemotherapy agents Taxotere and Cytoxan.  To help prevent nausea and vomiting after your treatment, we encourage you to take your nausea medication as prescribed.   If you develop nausea and vomiting that is not controlled by your nausea medication, call the clinic. If it is after clinic hours your family physician or the after hours number for the clinic or go to the Emergency Department.   BELOW ARE SYMPTOMS THAT SHOULD BE REPORTED IMMEDIATELY:  *FEVER GREATER THAN 100.5 F  *CHILLS WITH OR WITHOUT FEVER  NAUSEA AND VOMITING THAT IS NOT CONTROLLED WITH YOUR NAUSEA MEDICATION  *UNUSUAL SHORTNESS OF BREATH  *UNUSUAL BRUISING OR BLEEDING  TENDERNESS IN MOUTH AND THROAT WITH OR WITHOUT PRESENCE OF ULCERS  *URINARY PROBLEMS  *BOWEL PROBLEMS  UNUSUAL RASH Items with * indicate a potential emergency and should be followed up as soon as possible.  One of the nurses will contact you 24 hours after your treatment. Please let the nurse know about any problems that you may have experienced. Feel free to call the clinic you have any questions or concerns. The clinic phone number is [phone].   I have been informed and understand all the instructions given to me. I know to contact the clinic, my physician, or go to the Emergency Department if any problems should occur. I do not have any questions at this time, but understand that I may call the clinic during office hours   should I have any questions or need assistance in obtaining follow up care.    __________________________________________  _____________  __________ Signature of Patient or Authorized Representative            Date                   Time    __________________________________________ Nurse's Signature    

## 2011-09-23 ENCOUNTER — Ambulatory Visit (HOSPITAL_BASED_OUTPATIENT_CLINIC_OR_DEPARTMENT_OTHER): Payer: Medicare Other

## 2011-09-23 ENCOUNTER — Telehealth: Payer: Self-pay | Admitting: *Deleted

## 2011-09-23 VITALS — BP 139/77 | HR 76 | Temp 97.6°F

## 2011-09-23 DIAGNOSIS — C50319 Malignant neoplasm of lower-inner quadrant of unspecified female breast: Secondary | ICD-10-CM

## 2011-09-23 MED ORDER — PEGFILGRASTIM INJECTION 6 MG/0.6ML
6.0000 mg | Freq: Once | SUBCUTANEOUS | Status: AC
  Administered 2011-09-23: 6 mg via SUBCUTANEOUS
  Filled 2011-09-23: qty 0.6

## 2011-09-23 NOTE — Telephone Encounter (Signed)
Patient here for Neulasta injection today, following first chemo treatment yesterday.   Pt stated that she isn't having any problems from chemotherapy.  Drinking plenty of fluids and eating frequent small meals without nausea.  Patient and husband know to call the main number for any problems or concerns.

## 2011-09-26 ENCOUNTER — Encounter (INDEPENDENT_AMBULATORY_CARE_PROVIDER_SITE_OTHER): Payer: Self-pay | Admitting: Surgery

## 2011-09-26 ENCOUNTER — Ambulatory Visit (INDEPENDENT_AMBULATORY_CARE_PROVIDER_SITE_OTHER): Payer: Medicare Other | Admitting: Surgery

## 2011-09-26 VITALS — BP 138/86 | HR 71 | Temp 97.2°F | Resp 18 | Ht 62.0 in | Wt 290.2 lb

## 2011-09-26 DIAGNOSIS — C50319 Malignant neoplasm of lower-inner quadrant of unspecified female breast: Secondary | ICD-10-CM

## 2011-09-26 NOTE — Progress Notes (Signed)
Patient of Streck s/p right MRM.  Also had Left port placement.  Port was used this week without problems.  Some skin blistering from the steri-strips above the port, but no infection.  Mastectomy incision healing well with no sign of infection or seroma.    Follow-up 1 month with Streck.  [name]. Kathryn Skains, MD, [facility]  09/26/2011 1:30 PM

## 2011-09-29 ENCOUNTER — Other Ambulatory Visit (HOSPITAL_BASED_OUTPATIENT_CLINIC_OR_DEPARTMENT_OTHER): Payer: Medicare Other | Admitting: Lab

## 2011-09-29 ENCOUNTER — Ambulatory Visit (HOSPITAL_BASED_OUTPATIENT_CLINIC_OR_DEPARTMENT_OTHER): Payer: Medicare Other | Admitting: Oncology

## 2011-09-29 ENCOUNTER — Encounter: Payer: Self-pay | Admitting: Oncology

## 2011-09-29 VITALS — BP 144/80 | HR 105 | Temp 98.5°F | Ht 62.0 in | Wt 286.4 lb

## 2011-09-29 DIAGNOSIS — C50319 Malignant neoplasm of lower-inner quadrant of unspecified female breast: Secondary | ICD-10-CM

## 2011-09-29 DIAGNOSIS — Z170 Estrogen receptor positive status [ER+]: Secondary | ICD-10-CM

## 2011-09-29 DIAGNOSIS — Z9010 Acquired absence of unspecified breast and nipple: Secondary | ICD-10-CM

## 2011-09-29 LAB — COMPREHENSIVE METABOLIC PANEL
ALT: 12 U/L (ref 0–35)
Albumin: 4.1 g/dL (ref 3.5–5.2)
CO2: 32 mEq/L (ref 19–32)
Calcium: 9.4 mg/dL (ref 8.4–10.5)
Chloride: 96 mEq/L (ref 96–112)
Potassium: 3.5 mEq/L (ref 3.5–5.3)
Sodium: 138 mEq/L (ref 135–145)
Total Bilirubin: 1.1 mg/dL (ref 0.3–1.2)
Total Protein: 6.7 g/dL (ref 6.0–8.3)

## 2011-09-29 LAB — CBC WITH DIFFERENTIAL/PLATELET
Eosinophils Absolute: 0.1 10*3/uL (ref 0.0–0.5)
HGB: 11.5 g/dL — ABNORMAL LOW (ref 11.6–15.9)
MONO#: 1.5 10*3/uL — ABNORMAL HIGH (ref 0.1–0.9)
NEUT#: 3.8 10*3/uL (ref 1.5–6.5)
RBC: 3.94 10*6/uL (ref 3.70–5.45)
RDW: 14.9 % — ABNORMAL HIGH (ref 11.2–14.5)
WBC: 8 10*3/uL (ref 3.9–10.3)
lymph#: 2.6 10*3/uL (ref 0.9–3.3)
nRBC: 0 % (ref 0–0)

## 2011-09-29 MED ORDER — HYDROCODONE-ACETAMINOPHEN 5-500 MG PO CAPS: 1.0000 | ORAL_CAPSULE | Freq: Four times a day (QID) | ORAL | Status: AC | PRN

## 2011-09-29 NOTE — Progress Notes (Signed)
OFFICE PROGRESS NOTE  CC  [name],[name], MD, MD [address] [place] [address] Dr. Chipper Herb Dr. 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 Invasive ductal carcinoma (T3 NX MX).   #2 Patient has gone on to have right simple mastectomy with the final pathology revealing invasive and in situ ductal carcinoma. 2 nodules were seen 3 cm and 2.2 cm margins were negative there was focal lymphovascular space involvement by tumor. 1/15 sentinel node was positive for metastatic disease.  #3 the tumor was estrogen receptor +100% progesterone receptor +100% and HER-2/neu negative proliferation marker 53%. Final pathologic staging was P. T2 N1 A. MX.  # 4. Taxotere/Cytoxan q 3 weeks beginning on 09/22/11 on NSBP B-49  CURRENT THERAPY:Randomized to NSABP B-49 to begin TC q 3 weeks starting 09/22/11  INTERVAL HISTORY: [name] 61 y.o. female returns for Followup visit. Overall she tolerated the chemotherapy well. She has been taking her antiemetics on a very regular basis without any nausea or vomiting. She denies any fevers chills night sweats headaches shortness of breath chest pains palpitations no myalgias or arthralgias. Her blood count looks perfect today. Remainder of the 10 point review of systems is negative. MEDICAL HISTORY: Past Medical History  Diagnosis Date  . Hypertension   . Asthma   . Hyperlipemia   . Heart murmur   . Arthritis   . OSA (obstructive sleep apnea)     CPAP settings- ?   . Blood transfusion     hx of last one in 1987  . 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 MRSA (methicillin resistant staph aureus) culture positive     08/2011    ALLERGIES:  is  allergic to trandolapril-verapamil hcl er.  MEDICATIONS:  Current Outpatient Prescriptions  Medication Sig Dispense Refill  . albuterol (PROAIR HFA) 108 (90 BASE) MCG/ACT inhaler Inhale 2 puffs into the lungs every 6 (six) hours as needed. For shortness of breath      . cabergoline (DOSTINEX) 0.5 MG tablet Take 0.25 mg by mouth every Monday, Wednesday, and Friday.       Marland Kitchen dexamethasone (DECADRON) 4 MG tablet Take 2 tablets two times a day the day before Taxotere. Then take 2 tabs two times a day starting the day after chemo for 3 days.  30 tablet  1  . Fluticasone-Salmeterol (ADVAIR DISKUS) 100-50 MCG/DOSE AEPB Inhale 1 puff into the lungs daily after breakfast.       . furosemide (LASIX) 20 MG tablet Take 20 mg by mouth daily. Pt takes in am      . latanoprost (XALATAN) 0.005 % ophthalmic solution Place 1 drop into both eyes at bedtime.       . lidocaine-prilocaine (EMLA) cream Apply topically as needed. Apply to port 45 minutes prior to chemotherapy  30 g  4  . LORazepam (ATIVAN) 0.5 MG tablet Take 1 tablet (0.5 mg total) by mouth every 6 (six) hours as needed (Nausea or vomiting).  30 tablet  0  . Multiple Vitamin (MULITIVITAMIN WITH MINERALS) TABS Take 1 tablet by mouth daily with breakfast.       . ondansetron (ZOFRAN) 8 MG tablet Take 1 tablet two times a day starting the day  after chemo for 3 days. Then take 1 tab two times a day as needed for nausea or vomiting.  30 tablet  3  . prochlorperazine (COMPAZINE) 10 MG tablet Take 1 tablet (10 mg total) by mouth every 6 (six) hours as needed (Nausea or vomiting).  30 tablet  1  . prochlorperazine (COMPAZINE) 25 MG suppository Place 1 suppository (25 mg total) rectally every 12 (twelve) hours as needed for nausea.  12 suppository  3  . simvastatin (ZOCOR) 20 MG tablet Take 20 mg by mouth daily with breakfast.       . valsartan-hydrochlorothiazide (DIOVAN-HCT) 160-25 MG per tablet Take 1 tablet by mouth daily with breakfast.       .  hydrocodone-acetaminophen (LORCET-HD) 5-500 MG per capsule Take 1 capsule by mouth every 6 (six) hours as needed for pain.  30 capsule  0    SURGICAL HISTORY:  Past Surgical History  Procedure Date  . Tubal ligation   . Dilation and curettage of uterus     x3  . Carpal tunnel release   . Hand surgery     2012  . Cholecystectomy 1987    open - Dr Wiliam Ke  . Mastectomy modified radical 08/14/11    right , ER/PR +, HER2 -  . Abdominal hysterectomy 2007    TAH/BSO  . Breast surgery     mastectomy  . Port-a-cath insertion 09/11/11  . Portacath placement 09/11/2011    Procedure: INSERTION PORT-A-CATH;  Surgeon: Clovis Pu. Cornett, MD;  Location: [facility];  Service: General;  Laterality: N/A;  Insert of Port    REVIEW OF SYSTEMS:  Pertinent items are noted in HPI.   PHYSICAL EXAMINATION: General appearance: alert, cooperative and appears stated age Neck: no adenopathy, no carotid bruit, no JVD, supple, symmetrical, trachea midline and thyroid not enlarged, symmetric, no tenderness/mass/nodules Lymph nodes: Cervical, supraclavicular, and axillary nodes normal. Resp: clear to auscultation bilaterally and normal percussion bilaterally Back: symmetric, no curvature. ROM normal. No CVA tenderness. Cardio: regular rate and rhythm, S1, S2 normal, no murmur, click, rub or gallop GI: soft, non-tender; bowel sounds normal; no masses,  no organomegaly Extremities: extremities normal, atraumatic, no cyanosis or edema Neurologic: Grossly normal Right mastectomy scar is healing. Left breast no masses or nipple discharge. ECOG PERFORMANCE STATUS: 1 - Symptomatic but completely ambulatory  Blood pressure 144/80, pulse 105, temperature 98.5 F (36.9 C), temperature source Oral, height 5\' 2"  (1.575 m), weight 286 lb 6.4 oz (129.91 kg).  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 The final pathology revealed multifocal disease the largest tumor was 3 cm with a second tumor measuring 2.2 cm with  focal lymphovascular space involvement. One of 15 lymph nodes were positive for metastatic disease. Postoperatively patient is doing well. The tumor was ER +100% PR +100% HER-2/neu negative with a proliferation marker 53%.  #2 It has been enrolled on clinical trial NSABP B-49 utilizing Taxotere Cytoxan x6 cycles versus an anthracycline. Patient was randomized to Taxotere Cytoxan every 3 weeks for a total of 6 cycles. We have discussed this with the patient. She met with our clinical research nurse Dondra Spry lot. Risks and benefits of treatment have been discussed.  #3 patient is now status post cycle 1 of Tc. She has tolerated it well.  PLAN:   #1 her blood count looks great today. And she will be seen back on 10/13/2011  All questions were answered. The patient knows to call the clinic with any problems, questions or concerns. We can certainly see the patient much sooner if necessary.  I spent 30 minutes counseling the patient face to face. The total time spent in the appointment was 30 minutes.    Drue Second, MD Medical/Oncology [facility] [phone] (beeper) [phone] (Office)  09/29/2011, 2:06 PM

## 2011-09-29 NOTE — Patient Instructions (Signed)
1. You are doing well.  2. We will see you back on 5/20 for cycle 2 of your chemotherapy

## 2011-10-02 ENCOUNTER — Telehealth: Payer: Self-pay | Admitting: Medical Oncology

## 2011-10-02 NOTE — Telephone Encounter (Signed)
Received call from patient stating " I have a cold.  My temperature is 98.3."  Returned call to patient to obtain more information.  Patient denies chills, previous fever, body aches/pains.  Patient complains of dry cough, no production of sputum. No prior exposure to anyone sick. Patient stated "I am asthmatic and I have this dry cough and I wanted to know if the doctor wanted to call in some cough medicine for me. I have been drinking plenty of water."  Instructed patient to continue to drink plenty of fluids, get adequate amounts of rest, call if she begins to experience chills, fever above 100.5, nausea, vomiting, or diarrhea.  Will review with MD.

## 2011-10-02 NOTE — Telephone Encounter (Signed)
Notified patient and reiterated to continue taking in fluids, get plenty of rest and contact this office if her problems worsen.  Patient expressed understanding.

## 2011-10-02 NOTE — Telephone Encounter (Signed)
Ok noted, have patient call us if she continues to have any problems

## 2011-10-13 ENCOUNTER — Other Ambulatory Visit (HOSPITAL_BASED_OUTPATIENT_CLINIC_OR_DEPARTMENT_OTHER): Payer: Medicare Other | Admitting: Oncology

## 2011-10-13 ENCOUNTER — Encounter: Payer: Self-pay | Admitting: Oncology

## 2011-10-13 ENCOUNTER — Other Ambulatory Visit: Payer: Medicare Other | Admitting: Lab

## 2011-10-13 ENCOUNTER — Ambulatory Visit (HOSPITAL_BASED_OUTPATIENT_CLINIC_OR_DEPARTMENT_OTHER): Payer: Medicare Other

## 2011-10-13 ENCOUNTER — Ambulatory Visit (HOSPITAL_BASED_OUTPATIENT_CLINIC_OR_DEPARTMENT_OTHER): Payer: Medicare Other | Admitting: Oncology

## 2011-10-13 ENCOUNTER — Encounter: Payer: Self-pay | Admitting: *Deleted

## 2011-10-13 VITALS — BP 176/88 | HR 88 | Temp 98.2°F | Ht 62.0 in | Wt 290.0 lb

## 2011-10-13 DIAGNOSIS — Z170 Estrogen receptor positive status [ER+]: Secondary | ICD-10-CM

## 2011-10-13 DIAGNOSIS — Z5111 Encounter for antineoplastic chemotherapy: Secondary | ICD-10-CM

## 2011-10-13 DIAGNOSIS — E8989 Other postprocedural endocrine and metabolic complications and disorders: Secondary | ICD-10-CM

## 2011-10-13 DIAGNOSIS — C779 Secondary and unspecified malignant neoplasm of lymph node, unspecified: Secondary | ICD-10-CM

## 2011-10-13 DIAGNOSIS — C50319 Malignant neoplasm of lower-inner quadrant of unspecified female breast: Secondary | ICD-10-CM

## 2011-10-13 DIAGNOSIS — I890 Lymphedema, not elsewhere classified: Secondary | ICD-10-CM

## 2011-10-13 LAB — COMPREHENSIVE METABOLIC PANEL
ALT: 15 U/L (ref 0–35)
AST: 15 U/L (ref 0–37)
Chloride: 98 mEq/L (ref 96–112)
Creatinine, Ser: 0.88 mg/dL (ref 0.50–1.10)
Total Bilirubin: 0.8 mg/dL (ref 0.3–1.2)

## 2011-10-13 LAB — CBC WITH DIFFERENTIAL/PLATELET
BASO%: 0.3 % (ref 0.0–2.0)
LYMPH%: 10.4 % — ABNORMAL LOW (ref 14.0–49.7)
MCHC: 32 g/dL (ref 31.5–36.0)
MONO#: 0.4 10*3/uL (ref 0.1–0.9)
Platelets: 282 10*3/uL (ref 145–400)
RBC: 4.14 10*6/uL (ref 3.70–5.45)
WBC: 14.7 10*3/uL — ABNORMAL HIGH (ref 3.9–10.3)
nRBC: 0 % (ref 0–0)

## 2011-10-13 MED ORDER — ONDANSETRON 16 MG/50ML IVPB (CHCC)
16.0000 mg | Freq: Once | INTRAVENOUS | Status: AC
  Administered 2011-10-13: 16 mg via INTRAVENOUS

## 2011-10-13 MED ORDER — SODIUM CHLORIDE 0.9 % IJ SOLN
10.0000 mL | INTRAMUSCULAR | Status: DC | PRN
  Administered 2011-10-13: 10 mL
  Filled 2011-10-13: qty 10

## 2011-10-13 MED ORDER — SODIUM CHLORIDE 0.9 % IV SOLN
600.0000 mg/m2 | Freq: Once | INTRAVENOUS | Status: AC
  Administered 2011-10-13: 1440 mg via INTRAVENOUS
  Filled 2011-10-13: qty 72

## 2011-10-13 MED ORDER — SODIUM CHLORIDE 0.9 % IV SOLN
Freq: Once | INTRAVENOUS | Status: AC
  Administered 2011-10-13: 13:00:00 via INTRAVENOUS

## 2011-10-13 MED ORDER — DOCETAXEL CHEMO INJECTION 160 MG/16ML
75.0000 mg/m2 | Freq: Once | INTRAVENOUS | Status: AC
  Administered 2011-10-13: 180 mg via INTRAVENOUS
  Filled 2011-10-13: qty 18

## 2011-10-13 MED ORDER — DEXAMETHASONE SODIUM PHOSPHATE 4 MG/ML IJ SOLN
20.0000 mg | Freq: Once | INTRAMUSCULAR | Status: AC
  Administered 2011-10-13: 20 mg via INTRAVENOUS

## 2011-10-13 MED ORDER — HEPARIN SOD (PORK) LOCK FLUSH 100 UNIT/ML IV SOLN
500.0000 [IU] | Freq: Once | INTRAVENOUS | Status: AC | PRN
  Administered 2011-10-13: 500 [IU]
  Filled 2011-10-13: qty 5

## 2011-10-13 NOTE — Progress Notes (Signed)
OFFICE PROGRESS NOTE  CC  [name],[name], MD, MD [address] [place] [address] Dr. Chipper Herb Dr. 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 Invasive ductal carcinoma (T3 NX MX).   #2 Patient has gone on to have right simple mastectomy with the final pathology revealing invasive and in situ ductal carcinoma. 2 nodules were seen 3 cm and 2.2 cm margins were negative there was focal lymphovascular space involvement by tumor. 1/15 sentinel node was positive for metastatic disease.  #3 the tumor was estrogen receptor +100% progesterone receptor +100% and HER-2/neu negative proliferation marker 53%. Final pathologic staging was P. T2 N1 A. MX.  # 4. Taxotere/Cytoxan q 3 weeks beginning on 09/22/11 on NSBP B-49  CURRENT THERAPY: patient is here for cycle #2 of Taxotere Cytoxan with day 2 Neulasta.  INTERVAL HISTORY: [name] 61 y.o. female returns for Followup visit.  Overall patient is doing well. She tolerated the cycle 1 of her chemotherapy very well without any significant complaints. Today she feels well she is accompanied by her sister. Patient denies any fevers chills night sweats headaches shortness of breath chest pains palpitations she has no myalgias or arthralgias no skin changes no nail or hair changes. She did lose her hair and she is wearing a scarf. She has no vaginal bleeding no myalgias or arthralgias she did not develop any mucositis or any difficulty with swallowing. She has no abdominal pain and remainder of the 1010 point review of systems is negative. MEDICAL HISTORY: Past Medical History  Diagnosis Date  . Hypertension   . Asthma   . Hyperlipemia   . Heart murmur   . Arthritis   . OSA (obstructive sleep  apnea)     CPAP settings- ?   . Blood transfusion     hx of last one in 1987  . 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 MRSA (methicillin resistant staph aureus) culture positive     08/2011    ALLERGIES:  is allergic to trandolapril-verapamil hcl er.  MEDICATIONS:  Current Outpatient Prescriptions  Medication Sig Dispense Refill  . albuterol (PROAIR HFA) 108 (90 BASE) MCG/ACT inhaler Inhale 2 puffs into the lungs every 6 (six) hours as needed. For shortness of breath      . cabergoline (DOSTINEX) 0.5 MG tablet Take 0.25 mg by mouth every Monday, Wednesday, and Friday.       Marland Kitchen dexamethasone (DECADRON) 4 MG tablet Take 2 tablets two times a day the day before Taxotere. Then take 2 tabs two times a day starting the day after chemo for 3 days.  30 tablet  1  . Fluticasone-Salmeterol (ADVAIR DISKUS) 100-50 MCG/DOSE AEPB Inhale 1 puff into the lungs daily after breakfast.       . furosemide (LASIX) 20 MG tablet Take 20 mg by mouth daily. Pt takes in am      . latanoprost (XALATAN) 0.005 % ophthalmic solution Place 1 drop into both eyes at bedtime.       . lidocaine-prilocaine (EMLA) cream Apply topically as needed. Apply to port 45 minutes prior to chemotherapy  30 g  4  . LORazepam (ATIVAN) 0.5 MG tablet Take 1 tablet (0.5 mg total) by mouth every  6 (six) hours as needed (Nausea or vomiting).  30 tablet  0  . Multiple Vitamin (MULITIVITAMIN WITH MINERALS) TABS Take 1 tablet by mouth daily with breakfast.       . ondansetron (ZOFRAN) 8 MG tablet Take 1 tablet two times a day starting the day after chemo for 3 days. Then take 1 tab two times a day as needed for nausea or vomiting.  30 tablet  3  . prochlorperazine (COMPAZINE) 10 MG tablet Take 1 tablet (10 mg total) by mouth every 6 (six) hours as needed (Nausea or vomiting).  30 tablet  1  . prochlorperazine (COMPAZINE) 25 MG suppository Place 1 suppository (25 mg total) rectally every 12 (twelve)  hours as needed for nausea.  12 suppository  3  . simvastatin (ZOCOR) 20 MG tablet Take 20 mg by mouth daily with breakfast.       . valsartan-hydrochlorothiazide (DIOVAN-HCT) 160-25 MG per tablet Take 1 tablet by mouth daily with breakfast.         SURGICAL HISTORY:  Past Surgical History  Procedure Date  . Tubal ligation   . Dilation and curettage of uterus     x3  . Carpal tunnel release   . Hand surgery     2012  . Cholecystectomy 1987    open - Dr Wiliam Ke  . Mastectomy modified radical 08/14/11    right , ER/PR +, HER2 -  . Abdominal hysterectomy 2007    TAH/BSO  . Breast surgery     mastectomy  . Port-a-cath insertion 09/11/11  . Portacath placement 09/11/2011    Procedure: INSERTION PORT-A-CATH;  Surgeon: Clovis Pu. Cornett, MD;  Location: [facility];  Service: General;  Laterality: N/A;  Insert of Port    REVIEW OF SYSTEMS:  Pertinent items are noted in HPI.   PHYSICAL EXAMINATION: General appearance: alert, cooperative and appears stated age Neck: no adenopathy, no carotid bruit, no JVD, supple, symmetrical, trachea midline and thyroid not enlarged, symmetric, no tenderness/mass/nodules Lymph nodes: Cervical, supraclavicular, and axillary nodes normal. Resp: clear to auscultation bilaterally and normal percussion bilaterally Back: symmetric, no curvature. ROM normal. No CVA tenderness. Cardio: regular rate and rhythm, S1, S2 normal, no murmur, click, rub or gallop GI: soft, non-tender; bowel sounds normal; no masses,  no organomegaly Extremities: extremities normal, atraumatic, no cyanosis or edema Neurologic: Grossly normal Right mastectomy scar is healing. Left breast no masses or nipple discharge. ECOG PERFORMANCE STATUS: 1 - Symptomatic but completely ambulatory  Blood pressure 176/88, pulse 88, temperature 98.2 F (36.8 C), height 5\' 2"  (1.575 m), weight 290 lb (131.543 kg).  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  The final pathology revealed multifocal disease the largest tumor was 3 cm with a second tumor measuring 2.2 cm with focal lymphovascular space involvement. One of 15 lymph nodes were positive for metastatic disease. Postoperatively patient is doing well. The tumor was ER +100% PR +100% HER-2/neu negative with a proliferation marker 53%.  #2 It has been enrolled on clinical trial NSABP B-49 utilizing Taxotere Cytoxan x6 cycles versus an anthracycline. Patient was randomized to Taxotere Cytoxan every 3 weeks for a total of 6 cycles. We have discussed this with the patient. She met with our clinical research nurse Dondra Spry lot. Risks and benefits of treatment have been discussed.  #3 patient will proceed with cycle #2 of Taxotere and Cytoxan. She will come back for day 2 Neulasta tomorrow  PLAN:  #1 overall patient is tolerating her chemotherapy well. She will continue the chemotherapy today with cycle 2. Risks and benefits of treatment are discussed with the patient.  All questions were answered. The patient knows to call the clinic with any problems, questions or concerns. We can certainly see the patient much sooner if necessary.  I spent 30 minutes counseling the patient face to face. The total time spent in the appointment was 30 minutes.    Drue Second, MD Medical/Oncology [facility] [phone] (beeper) [phone] (Office)  10/13/2011, 1:07 PM

## 2011-10-13 NOTE — Patient Instructions (Signed)
Proceed with scheduled cycle 2 of taxotere and cytoxan. You will return tomorrow for the neulasta injection I will see you back in 1 week for interim labs

## 2011-10-13 NOTE — Progress Notes (Signed)
10/13/11 @ 1:30pm, NSABP B-49, Cycle 2, Day 1:  [name], accompanied by her sister, into [facility] for labs, to see Dr. Welton Flakes and receive her second treatment with Taxotere and Cytoxan per the B-49 protocol.  Her labs were appropriate to proceed with treatment.  She tolerated her first treatment very well.  Spoke with Amy Horton, RN in the infusion room.  Provided her with a sign to indicate that all treatment is given per institutional standards.

## 2011-10-13 NOTE — Patient Instructions (Signed)
[facility] Discharge Instructions for Patients Receiving Chemotherapy  Today you received the following chemotherapy agents Taxotere and Cytoxan.  To help prevent nausea and vomiting after your treatment, we encourage you to take your nausea medication as prescribed.   If you develop nausea and vomiting that is not controlled by your nausea medication, call the clinic. If it is after clinic hours your family physician or the after hours number for the clinic or go to the Emergency Department.   BELOW ARE SYMPTOMS THAT SHOULD BE REPORTED IMMEDIATELY:  *FEVER GREATER THAN 100.5 F  *CHILLS WITH OR WITHOUT FEVER  NAUSEA AND VOMITING THAT IS NOT CONTROLLED WITH YOUR NAUSEA MEDICATION  *UNUSUAL SHORTNESS OF BREATH  *UNUSUAL BRUISING OR BLEEDING  TENDERNESS IN MOUTH AND THROAT WITH OR WITHOUT PRESENCE OF ULCERS  *URINARY PROBLEMS  *BOWEL PROBLEMS  UNUSUAL RASH Items with * indicate a potential emergency and should be followed up as soon as possible.  One of the nurses will contact you 24 hours after your treatment. Please let the nurse know about any problems that you may have experienced. Feel free to call the clinic you have any questions or concerns. The clinic phone number is [phone].   I have been informed and understand all the instructions given to me. I know to contact the clinic, my physician, or go to the Emergency Department if any problems should occur. I do not have any questions at this time, but understand that I may call the clinic during office hours   should I have any questions or need assistance in obtaining follow up care.    __________________________________________  _____________  __________ Signature of Patient or Authorized Representative            Date                   Time    __________________________________________ Nurse's Signature    

## 2011-10-14 ENCOUNTER — Telehealth: Payer: Self-pay | Admitting: Oncology

## 2011-10-14 ENCOUNTER — Ambulatory Visit (HOSPITAL_BASED_OUTPATIENT_CLINIC_OR_DEPARTMENT_OTHER): Payer: Medicare Other

## 2011-10-14 VITALS — BP 153/76 | HR 65 | Temp 97.2°F

## 2011-10-14 DIAGNOSIS — C50319 Malignant neoplasm of lower-inner quadrant of unspecified female breast: Secondary | ICD-10-CM

## 2011-10-14 MED ORDER — PEGFILGRASTIM INJECTION 6 MG/0.6ML
6.0000 mg | Freq: Once | SUBCUTANEOUS | Status: AC
  Administered 2011-10-14: 6 mg via SUBCUTANEOUS
  Filled 2011-10-14: qty 0.6

## 2011-10-14 NOTE — Telephone Encounter (Signed)
S/w April and she is aware of the referral in epic for the lymphedema clinic

## 2011-10-21 ENCOUNTER — Other Ambulatory Visit (HOSPITAL_BASED_OUTPATIENT_CLINIC_OR_DEPARTMENT_OTHER): Payer: Medicare Other | Admitting: Lab

## 2011-10-21 ENCOUNTER — Encounter: Payer: Self-pay | Admitting: Oncology

## 2011-10-21 ENCOUNTER — Ambulatory Visit (HOSPITAL_BASED_OUTPATIENT_CLINIC_OR_DEPARTMENT_OTHER): Payer: Medicare Other | Admitting: Oncology

## 2011-10-21 VITALS — BP 118/72 | HR 96 | Temp 98.3°F | Ht 62.0 in | Wt 288.9 lb

## 2011-10-21 DIAGNOSIS — C50319 Malignant neoplasm of lower-inner quadrant of unspecified female breast: Secondary | ICD-10-CM

## 2011-10-21 DIAGNOSIS — M2550 Pain in unspecified joint: Secondary | ICD-10-CM

## 2011-10-21 DIAGNOSIS — IMO0001 Reserved for inherently not codable concepts without codable children: Secondary | ICD-10-CM

## 2011-10-21 DIAGNOSIS — Z170 Estrogen receptor positive status [ER+]: Secondary | ICD-10-CM

## 2011-10-21 DIAGNOSIS — R52 Pain, unspecified: Secondary | ICD-10-CM

## 2011-10-21 LAB — COMPREHENSIVE METABOLIC PANEL
ALT: 11 U/L (ref 0–35)
AST: 11 U/L (ref 0–37)
Alkaline Phosphatase: 69 U/L (ref 39–117)
BUN: 12 mg/dL (ref 6–23)
Calcium: 8.9 mg/dL (ref 8.4–10.5)
Chloride: 103 mEq/L (ref 96–112)
Creatinine, Ser: 0.92 mg/dL (ref 0.50–1.10)
Potassium: 3.6 mEq/L (ref 3.5–5.3)

## 2011-10-21 LAB — CBC WITH DIFFERENTIAL/PLATELET
BASO%: 0.6 % (ref 0.0–2.0)
EOS%: 0.4 % (ref 0.0–7.0)
LYMPH%: 21.4 % (ref 14.0–49.7)
MCHC: 32.6 g/dL (ref 31.5–36.0)
MCV: 89.8 fL (ref 79.5–101.0)
MONO%: 13.9 % (ref 0.0–14.0)
Platelets: 166 10*3/uL (ref 145–400)
RBC: 3.56 10*6/uL — ABNORMAL LOW (ref 3.70–5.45)
WBC: 12.3 10*3/uL — ABNORMAL HIGH (ref 3.9–10.3)
nRBC: 0 % (ref 0–0)

## 2011-10-21 MED ORDER — HYDROCODONE-ACETAMINOPHEN 5-500 MG PO CAPS: 1.0000 | ORAL_CAPSULE | Freq: Four times a day (QID) | ORAL | Status: AC | PRN

## 2011-10-21 NOTE — Progress Notes (Signed)
OFFICE PROGRESS NOTE  CC  [name],Kathryn A, MD, MD [address] [place] [address] Dr. Chipper Herb Dr. 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 Invasive ductal carcinoma (T3 NX MX).   #2 Patient has gone on to have right simple mastectomy with the final pathology revealing invasive and in situ ductal carcinoma. 2 nodules were seen 3 cm and 2.2 cm margins were negative there was focal lymphovascular space involvement by tumor. 1/15 sentinel node was positive for metastatic disease.  #3 the tumor was estrogen receptor +100% progesterone receptor +100% and HER-2/neu negative proliferation marker 53%. Final pathologic staging was P. T2 N1 [name]. MX.  # 4. Taxotere/Cytoxan q 3 weeks beginning on 09/22/11 on NSBP B-49  CURRENT THERAPY: S/P cycle #2 of Taxotere Cytoxan with day 2 Neulasta.  INTERVAL HISTORY: [name] 61 y.o. female returns for Followup visit.  Overall patient is doing well she received her chemotherapy last week did well. Her blood counts grade she herself feels well. Her major complaint is post Neulasta myalgias and arthralgias. I have given her [name] prescription for oxycodone. Hopefully this will help her. She otherwise denies any fevers chills night sweats headaches shortness of breath chest pains palpitations. Remainder of the 10 point review of systems is negative. MEDICAL HISTORY: Past Medical History  Diagnosis Date  . Hypertension   . Asthma   . Hyperlipemia   . Heart murmur   . Arthritis   . OSA (obstructive sleep apnea)     CPAP settings- ?   . Blood transfusion     hx of last one in 1987  . 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 MRSA (methicillin resistant  staph aureus) culture positive     08/2011    ALLERGIES:  is allergic to trandolapril-verapamil hcl er.  MEDICATIONS:  Current Outpatient Prescriptions  Medication Sig Dispense Refill  . albuterol (PROAIR HFA) 108 (90 BASE) MCG/ACT inhaler Inhale 2 puffs into the lungs every 6 (six) hours as needed. For shortness of breath      . cabergoline (DOSTINEX) 0.5 MG tablet Take 0.25 mg by mouth every Monday, Wednesday, and Friday.       Marland Kitchen dexamethasone (DECADRON) 4 MG tablet Take 2 tablets two times [name] the day before Taxotere. Then take 2 tabs two times [name] starting the day after chemo for 3 days.  30 tablet  1  . Fluticasone-Salmeterol (ADVAIR DISKUS) 100-50 MCG/DOSE AEPB Inhale 1 puff into the lungs daily after breakfast.       . furosemide (LASIX) 20 MG tablet Take 20 mg by mouth daily. Pt takes in am      . latanoprost (XALATAN) 0.005 % ophthalmic solution Place 1 drop into both eyes at bedtime.       . lidocaine-prilocaine (EMLA) cream Apply topically as needed. Apply to port 45 minutes prior to chemotherapy  30 g  4  . LORazepam (ATIVAN) 0.5 MG tablet Take 1 tablet (0.5 mg total) by mouth every 6 (six) hours as needed (Nausea or vomiting).  30 tablet  0  . Multiple Vitamin (MULITIVITAMIN WITH MINERALS) TABS Take 1 tablet by mouth daily with breakfast.       . ondansetron (ZOFRAN) 8  MG tablet Take 1 tablet two times [name] starting the day after chemo for 3 days. Then take 1 tab two times [name] as needed for nausea or vomiting.  30 tablet  3  . prochlorperazine (COMPAZINE) 10 MG tablet Take 1 tablet (10 mg total) by mouth every 6 (six) hours as needed (Nausea or vomiting).  30 tablet  1  . prochlorperazine (COMPAZINE) 25 MG suppository Place 1 suppository (25 mg total) rectally every 12 (twelve) hours as needed for nausea.  12 suppository  3  . simvastatin (ZOCOR) 20 MG tablet Take 20 mg by mouth daily with breakfast.       . valsartan-hydrochlorothiazide (DIOVAN-HCT) 160-25 MG per tablet Take  1 tablet by mouth daily with breakfast.         SURGICAL HISTORY:  Past Surgical History  Procedure Date  . Tubal ligation   . Dilation and curettage of uterus     x3  . Carpal tunnel release   . Hand surgery     2012  . Cholecystectomy 1987    open - Dr Wiliam Ke  . Mastectomy modified radical 08/14/11    right , ER/PR +, HER2 -  . Abdominal hysterectomy 2007    TAH/BSO  . Breast surgery     mastectomy  . Port-[name]-cath insertion 09/11/11  . Portacath placement 09/11/2011    Procedure: INSERTION PORT-[name]-CATH;  Surgeon: Clovis Pu. Cornett, MD;  Location: [facility];  Service: General;  Laterality: N/[name];  Insert of Port    REVIEW OF SYSTEMS:  Pertinent items are noted in HPI.   PHYSICAL EXAMINATION: General appearance: alert, cooperative and appears stated age Neck: no adenopathy, no carotid bruit, no JVD, supple, symmetrical, trachea midline and thyroid not enlarged, symmetric, no tenderness/mass/nodules Lymph nodes: Cervical, supraclavicular, and axillary nodes normal. Resp: clear to auscultation bilaterally and normal percussion bilaterally Back: symmetric, no curvature. ROM normal. No CVA tenderness. Cardio: regular rate and rhythm, S1, S2 normal, no murmur, click, rub or gallop GI: soft, non-tender; bowel sounds normal; no masses,  no organomegaly Extremities: extremities normal, atraumatic, no cyanosis or edema Neurologic: Grossly normal Right mastectomy scar is healing. Left breast no masses or nipple discharge. ECOG PERFORMANCE STATUS: 1 - Symptomatic but completely ambulatory  Blood pressure 118/72, pulse 96, temperature 98.3 F (36.8 C), temperature source Oral, height 5\' 2"  (1.575 m), weight 288 lb 14.4 oz (131.044 kg).  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 The final pathology revealed multifocal disease the largest tumor was 3 cm with [name] second tumor measuring 2.2 cm with focal lymphovascular space involvement. One of 15 lymph nodes were positive for metastatic  disease. Postoperatively patient is doing well. The tumor was ER +100% PR +100% HER-2/neu negative with [name] proliferation marker 53%.  #2 It has been enrolled on clinical trial NSABP B-49 utilizing Taxotere Cytoxan x6 cycles versus an anthracycline. Patient was randomized to Taxotere Cytoxan every 3 weeks for [name] total of 6 cycles. We have discussed this with the patient. She met with our clinical research nurse Dondra Spry lot. Risks and benefits of treatment have been discussed.  #3 patient is status post cycle #2 of Taxotere and Cytoxan last week. She tolerated it well without any significant complaints blood count looks terrific.  PLAN:  #1 patient is doing well. Her counseled great. She does develop myalgias and arthralgias secondary to the Neulasta.  #2 patient was given [name] prescription for oxycodone to be taken as needed when she gets her Neulasta injection.  #3 patient will return on 11/03/2011 for cycle #3 of her chemotherapy.  All questions were answered. The patient knows to call the clinic with any problems, questions or concerns. We can certainly see the patient much sooner if necessary.  I spent 30 minutes counseling the patient face to face. The total time spent in the appointment was 30 minutes.    Drue Second, MD Medical/Oncology [facility] [phone] (beeper) [phone] (Office)  10/21/2011, 11:09 AM

## 2011-10-21 NOTE — Patient Instructions (Addendum)
Doing well, cbc looks good. Take hydrocodone for the bone pain associated with neulasta.  I will see you back on 6/10

## 2011-10-23 ENCOUNTER — Ambulatory Visit: Payer: Medicare Other | Admitting: Physical Therapy

## 2011-11-03 ENCOUNTER — Ambulatory Visit (HOSPITAL_BASED_OUTPATIENT_CLINIC_OR_DEPARTMENT_OTHER): Payer: Medicare Other

## 2011-11-03 ENCOUNTER — Encounter: Payer: Self-pay | Admitting: *Deleted

## 2011-11-03 ENCOUNTER — Ambulatory Visit (HOSPITAL_BASED_OUTPATIENT_CLINIC_OR_DEPARTMENT_OTHER): Payer: Medicare Other | Admitting: Oncology

## 2011-11-03 ENCOUNTER — Other Ambulatory Visit (HOSPITAL_BASED_OUTPATIENT_CLINIC_OR_DEPARTMENT_OTHER): Payer: Medicare Other | Admitting: Lab

## 2011-11-03 ENCOUNTER — Encounter: Payer: Self-pay | Admitting: Oncology

## 2011-11-03 VITALS — BP 145/78 | HR 86 | Temp 98.6°F | Ht 62.0 in | Wt 295.6 lb

## 2011-11-03 DIAGNOSIS — C50319 Malignant neoplasm of lower-inner quadrant of unspecified female breast: Secondary | ICD-10-CM

## 2011-11-03 DIAGNOSIS — C773 Secondary and unspecified malignant neoplasm of axilla and upper limb lymph nodes: Secondary | ICD-10-CM

## 2011-11-03 DIAGNOSIS — Z5111 Encounter for antineoplastic chemotherapy: Secondary | ICD-10-CM

## 2011-11-03 LAB — CBC WITH DIFFERENTIAL/PLATELET
BASO%: 0.2 % (ref 0.0–2.0)
Basophils Absolute: 0 10*3/uL (ref 0.0–0.1)
EOS%: 0 % (ref 0.0–7.0)
HCT: 33.7 % — ABNORMAL LOW (ref 34.8–46.6)
HGB: 11.2 g/dL — ABNORMAL LOW (ref 11.6–15.9)
LYMPH%: 11.3 % — ABNORMAL LOW (ref 14.0–49.7)
MCH: 28.9 pg (ref 25.1–34.0)
MCHC: 33.2 g/dL (ref 31.5–36.0)
MCV: 87.1 fL (ref 79.5–101.0)
MONO%: 3.2 % (ref 0.0–14.0)
NEUT%: 85.3 % — ABNORMAL HIGH (ref 38.4–76.8)

## 2011-11-03 LAB — COMPREHENSIVE METABOLIC PANEL
AST: 12 U/L (ref 0–37)
Albumin: 3.7 g/dL (ref 3.5–5.2)
Alkaline Phosphatase: 67 U/L (ref 39–117)
BUN: 17 mg/dL (ref 6–23)
Creatinine, Ser: 0.82 mg/dL (ref 0.50–1.10)
Glucose, Bld: 131 mg/dL — ABNORMAL HIGH (ref 70–99)
Total Bilirubin: 0.8 mg/dL (ref 0.3–1.2)

## 2011-11-03 MED ORDER — DOCETAXEL CHEMO INJECTION 160 MG/16ML
75.0000 mg/m2 | Freq: Once | INTRAVENOUS | Status: AC
  Administered 2011-11-03: 180 mg via INTRAVENOUS
  Filled 2011-11-03: qty 18

## 2011-11-03 MED ORDER — SODIUM CHLORIDE 0.9 % IV SOLN
Freq: Once | INTRAVENOUS | Status: AC
  Administered 2011-11-03: 14:00:00 via INTRAVENOUS

## 2011-11-03 MED ORDER — CYCLOPHOSPHAMIDE CHEMO INJECTION 1 GM
600.0000 mg/m2 | Freq: Once | INTRAMUSCULAR | Status: AC
  Administered 2011-11-03: 1440 mg via INTRAVENOUS
  Filled 2011-11-03: qty 72

## 2011-11-03 MED ORDER — ONDANSETRON 16 MG/50ML IVPB (CHCC)
16.0000 mg | Freq: Once | INTRAVENOUS | Status: AC
  Administered 2011-11-03: 16 mg via INTRAVENOUS

## 2011-11-03 MED ORDER — SODIUM CHLORIDE 0.9 % IJ SOLN
10.0000 mL | INTRAMUSCULAR | Status: DC | PRN
  Administered 2011-11-03: 10 mL
  Filled 2011-11-03: qty 10

## 2011-11-03 MED ORDER — DEXAMETHASONE SODIUM PHOSPHATE 4 MG/ML IJ SOLN
20.0000 mg | Freq: Once | INTRAMUSCULAR | Status: AC
  Administered 2011-11-03: 20 mg via INTRAVENOUS

## 2011-11-03 MED ORDER — HEPARIN SOD (PORK) LOCK FLUSH 100 UNIT/ML IV SOLN
500.0000 [IU] | Freq: Once | INTRAVENOUS | Status: AC | PRN
  Administered 2011-11-03: 500 [IU]
  Filled 2011-11-03: qty 5

## 2011-11-03 NOTE — Patient Instructions (Signed)
Proceed with cycle 3 of chemotherapy Keep appointment with lymphedema clinic

## 2011-11-03 NOTE — Progress Notes (Signed)
11/03/11 2:00pm NSABP  B-49, Cycle 3, Day 1:   Mrs. Occhipinti into the [facility] for a CBC, stat CMET, to see Dr. Welton Flakes and to receive treatment with Taxotere and Cytoxan per the B-49 study.  Her labs are all appropriate to proceed, as is her physical condition.  Her ECOG PS is unchanged at 1.  Spoke with Bradd Burner, RN and Deana, RN in the Infusion Area about the treatment for today.  Let them know that all treatment is given per institutional standards.

## 2011-11-03 NOTE — Progress Notes (Signed)
OFFICE PROGRESS NOTE  CC  [name],[name], MD, MD [address] [place] [address] Dr. Chipper Herb Dr. 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 Invasive ductal carcinoma (T3 NX MX).   #2 Patient has gone on to have right simple mastectomy with the final pathology revealing invasive and in situ ductal carcinoma. 2 nodules were seen 3 cm and 2.2 cm margins were negative there was focal lymphovascular space involvement by tumor. 1/15 sentinel node was positive for metastatic disease.  #3 the tumor was estrogen receptor +100% progesterone receptor +100% and HER-2/neu negative proliferation marker 53%. Final pathologic staging was P. T2 N1 A. MX.  # 4. Taxotere/Cytoxan q 3 weeks beginning on 09/22/11 on NSBP B-49  CURRENT THERAPY:  cycle #3 of Taxotere Cytoxan with day 2 Neulasta.  INTERVAL HISTORY: [name] 61 y.o. female returns for Followup visit.  She is here for cycle #3 of Taxotere Cytoxan following NSABP B-49 clinical trial. She is due to get a total of 6 cycles this is #3 today. Clinically at the she is tolerating the treatment very well. Today she is accompanied by one of her friends. She denies any fevers chills night sweats headaches shortness of breath chest pains no myalgias or arthralgias. She is noticing some taste changes but they do recovered very quickly after the chemotherapy effect wears off. She is monitoring her diet and she is certainly trying to exercise. Remainder of the 10 point review of systems is negative. MEDICAL HISTORY: Past Medical History  Diagnosis Date  . Hypertension   . Asthma   . Hyperlipemia   . Heart murmur   . Arthritis   . OSA (obstructive sleep apnea)     CPAP settings- ?   . Blood  transfusion     hx of last one in 1987  . 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 MRSA (methicillin resistant staph aureus) culture positive     08/2011    ALLERGIES:  is allergic to trandolapril-verapamil hcl er.  MEDICATIONS:  Current Outpatient Prescriptions  Medication Sig Dispense Refill  . albuterol (PROAIR HFA) 108 (90 BASE) MCG/ACT inhaler Inhale 2 puffs into the lungs every 6 (six) hours as needed. For shortness of breath      . cabergoline (DOSTINEX) 0.5 MG tablet Take 0.25 mg by mouth every Monday, Wednesday, and Friday.       Marland Kitchen dexamethasone (DECADRON) 4 MG tablet Take 2 tablets two times a day the day before Taxotere. Then take 2 tabs two times a day starting the day after chemo for 3 days.  30 tablet  1  . Fluticasone-Salmeterol (ADVAIR DISKUS) 100-50 MCG/DOSE AEPB Inhale 1 puff into the lungs daily after breakfast.       . furosemide (LASIX) 20 MG tablet Take 20 mg by mouth daily. Pt takes in am      . latanoprost (XALATAN) 0.005 % ophthalmic solution Place 1 drop into both eyes at bedtime.       . lidocaine-prilocaine (EMLA) cream Apply topically as needed. Apply to port 45 minutes prior to chemotherapy  30 g  4  . LORazepam (ATIVAN) 0.5 MG tablet Take 1 tablet (0.5 mg total) by mouth every 6 (six) hours  as needed (Nausea or vomiting).  30 tablet  0  . Multiple Vitamin (MULITIVITAMIN WITH MINERALS) TABS Take 1 tablet by mouth daily with breakfast.       . ondansetron (ZOFRAN) 8 MG tablet Take 1 tablet two times a day starting the day after chemo for 3 days. Then take 1 tab two times a day as needed for nausea or vomiting.  30 tablet  3  . prochlorperazine (COMPAZINE) 10 MG tablet Take 1 tablet (10 mg total) by mouth every 6 (six) hours as needed (Nausea or vomiting).  30 tablet  1  . prochlorperazine (COMPAZINE) 25 MG suppository Place 1 suppository (25 mg total) rectally every 12 (twelve) hours as needed for nausea.  12 suppository   3  . simvastatin (ZOCOR) 20 MG tablet Take 20 mg by mouth daily with breakfast.       . valsartan-hydrochlorothiazide (DIOVAN-HCT) 160-25 MG per tablet Take 1 tablet by mouth daily with breakfast.         SURGICAL HISTORY:  Past Surgical History  Procedure Date  . Tubal ligation   . Dilation and curettage of uterus     x3  . Carpal tunnel release   . Hand surgery     2012  . Cholecystectomy 1987    open - Dr Wiliam Ke  . Mastectomy modified radical 08/14/11    right , ER/PR +, HER2 -  . Abdominal hysterectomy 2007    TAH/BSO  . Breast surgery     mastectomy  . Port-a-cath insertion 09/11/11  . Portacath placement 09/11/2011    Procedure: INSERTION PORT-A-CATH;  Surgeon: Clovis Pu. Cornett, MD;  Location: [facility];  Service: General;  Laterality: N/A;  Insert of Port    REVIEW OF SYSTEMS:  Pertinent items are noted in HPI.   PHYSICAL EXAMINATION: General appearance: alert, cooperative and appears stated age Neck: no adenopathy, no carotid bruit, no JVD, supple, symmetrical, trachea midline and thyroid not enlarged, symmetric, no tenderness/mass/nodules Lymph nodes: Cervical, supraclavicular, and axillary nodes normal. Resp: clear to auscultation bilaterally and normal percussion bilaterally Back: symmetric, no curvature. ROM normal. No CVA tenderness. Cardio: regular rate and rhythm, S1, S2 normal, no murmur, click, rub or gallop GI: soft, non-tender; bowel sounds normal; no masses,  no organomegaly Extremities: extremities normal, atraumatic, no cyanosis or edema Neurologic: Grossly normal Right mastectomy scar is healing. Left breast no masses or nipple discharge.  ECOG PERFORMANCE STATUS: 1 - Symptomatic but completely ambulatory  Blood pressure 145/78, pulse 86, temperature 98.6 F (37 C), temperature source Oral, height 5\' 2"  (1.575 m), weight 295 lb 9.6 oz (134.083 kg).  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 The final pathology revealed multifocal disease the largest tumor was 3 cm with a second tumor measuring 2.2 cm with focal lymphovascular space involvement. One of 15 lymph nodes were positive for metastatic disease. Postoperatively patient is doing well. The tumor was ER +100% PR +100% HER-2/neu negative with a proliferation marker 53%.  #2 It has been enrolled on clinical trial NSABP B-49 utilizing Taxotere Cytoxan x6 cycles versus an anthracycline. Patient was randomized to Taxotere Cytoxan every 3 weeks for a total of 6 cycles. We have discussed this with the patient. She met with our clinical research nurse Dondra Spry lot. Risks and benefits of treatment have been discussed.  #3 patient will proceed with cycle #3 of Taxotere and Cytoxan. She will return tomorrow for Neulasta injection.her performance status remains stable.   PLAN:  #1 proceed with scheduled chemotherapy today.  #2 patient will return in one week's time for interim labs.  #3 however she knows to call me if any problems should arise in the meantime.  All questions were answered. The patient knows to call the clinic with any problems, questions or concerns. We can certainly see the patient much sooner if necessary.  I spent 30 minutes counseling the patient face to face. The total time spent in the appointment was 30  minutes.    Drue Second, MD Medical/Oncology [facility] [phone] (beeper) [phone] (Office)  11/03/2011, 1:42 PM

## 2011-11-03 NOTE — Patient Instructions (Signed)
[facility] Discharge Instructions for Patients Receiving Chemotherapy  Today you received the following chemotherapy agents Cytoxan, Taxotere  To help prevent nausea and vomiting after your treatment, we encourage you to take your nausea medication.   If you develop nausea and vomiting that is not controlled by your nausea medication, call the clinic. If it is after clinic hours your family physician or the after hours number for the clinic or go to the Emergency Department.   BELOW ARE SYMPTOMS THAT SHOULD BE REPORTED IMMEDIATELY:  *FEVER GREATER THAN 100.5 F  *CHILLS WITH OR WITHOUT FEVER  NAUSEA AND VOMITING THAT IS NOT CONTROLLED WITH YOUR NAUSEA MEDICATION  *UNUSUAL SHORTNESS OF BREATH  *UNUSUAL BRUISING OR BLEEDING  TENDERNESS IN MOUTH AND THROAT WITH OR WITHOUT PRESENCE OF ULCERS  *URINARY PROBLEMS  *BOWEL PROBLEMS  UNUSUAL RASH Items with * indicate a potential emergency and should be followed up as soon as possible.  One of the nurses will contact you 24 hours after your treatment. Please let the nurse know about any problems that you may have experienced. Feel free to call the clinic you have any questions or concerns. The clinic phone number is [phone].   I have been informed and understand all the instructions given to me. I know to contact the clinic, my physician, or go to the Emergency Department if any problems should occur. I do not have any questions at this time, but understand that I may call the clinic during office hours   should I have any questions or need assistance in obtaining follow up care.    __________________________________________  _____________  __________ Signature of Patient or Authorized Representative            Date                   Time    __________________________________________ Nurse's Signature

## 2011-11-04 ENCOUNTER — Ambulatory Visit (HOSPITAL_BASED_OUTPATIENT_CLINIC_OR_DEPARTMENT_OTHER): Payer: Medicare Other

## 2011-11-04 VITALS — BP 163/79 | HR 98 | Temp 97.4°F

## 2011-11-04 DIAGNOSIS — C50319 Malignant neoplasm of lower-inner quadrant of unspecified female breast: Secondary | ICD-10-CM

## 2011-11-04 DIAGNOSIS — Z5189 Encounter for other specified aftercare: Secondary | ICD-10-CM

## 2011-11-04 MED ORDER — PEGFILGRASTIM INJECTION 6 MG/0.6ML
6.0000 mg | Freq: Once | SUBCUTANEOUS | Status: AC
  Administered 2011-11-04: 6 mg via SUBCUTANEOUS
  Filled 2011-11-04: qty 0.6

## 2011-11-05 ENCOUNTER — Ambulatory Visit: Payer: Medicare Other | Attending: Oncology | Admitting: Physical Therapy

## 2011-11-05 DIAGNOSIS — IMO0001 Reserved for inherently not codable concepts without codable children: Secondary | ICD-10-CM | POA: Insufficient documentation

## 2011-11-05 DIAGNOSIS — I890 Lymphedema, not elsewhere classified: Secondary | ICD-10-CM | POA: Insufficient documentation

## 2011-11-10 ENCOUNTER — Other Ambulatory Visit (HOSPITAL_BASED_OUTPATIENT_CLINIC_OR_DEPARTMENT_OTHER): Payer: Medicare Other | Admitting: Lab

## 2011-11-10 ENCOUNTER — Encounter: Payer: Self-pay | Admitting: Oncology

## 2011-11-10 ENCOUNTER — Ambulatory Visit (HOSPITAL_BASED_OUTPATIENT_CLINIC_OR_DEPARTMENT_OTHER): Payer: Medicare Other | Admitting: Oncology

## 2011-11-10 VITALS — BP 134/76 | HR 116 | Temp 97.7°F | Ht 62.0 in | Wt 294.1 lb

## 2011-11-10 DIAGNOSIS — C50319 Malignant neoplasm of lower-inner quadrant of unspecified female breast: Secondary | ICD-10-CM

## 2011-11-10 DIAGNOSIS — R52 Pain, unspecified: Secondary | ICD-10-CM

## 2011-11-10 LAB — CBC WITH DIFFERENTIAL/PLATELET
BASO%: 0.9 % (ref 0.0–2.0)
Basophils Absolute: 0 10*3/uL (ref 0.0–0.1)
Eosinophils Absolute: 0.1 10*3/uL (ref 0.0–0.5)
HCT: 31 % — ABNORMAL LOW (ref 34.8–46.6)
HGB: 10.3 g/dL — ABNORMAL LOW (ref 11.6–15.9)
LYMPH%: 35.2 % (ref 14.0–49.7)
MCHC: 33.1 g/dL (ref 31.5–36.0)
MONO#: 1.3 10*3/uL — ABNORMAL HIGH (ref 0.1–0.9)
NEUT%: 35.8 % — ABNORMAL LOW (ref 38.4–76.8)
Platelets: 141 10*3/uL — ABNORMAL LOW (ref 145–400)
WBC: 5 10*3/uL (ref 3.9–10.3)
lymph#: 1.8 10*3/uL (ref 0.9–3.3)

## 2011-11-10 LAB — COMPREHENSIVE METABOLIC PANEL
AST: 13 U/L (ref 0–37)
Albumin: 4 g/dL (ref 3.5–5.2)
Alkaline Phosphatase: 66 U/L (ref 39–117)
Chloride: 102 mEq/L (ref 96–112)
Total Protein: 5.9 g/dL — ABNORMAL LOW (ref 6.0–8.3)

## 2011-11-10 MED ORDER — OXYCODONE-ACETAMINOPHEN 5-325 MG PO TABS: 1.0000 | ORAL_TABLET | Freq: Four times a day (QID) | ORAL | Status: DC | PRN

## 2011-11-10 NOTE — Patient Instructions (Addendum)
1. Take B complex vitamin and folic acid daily. You can get these over the counter.  2. Percocet 1 every 6 hours as need for pain  3. Keep your appointments with your eye doctor and your dentist  4. We will see you back on 7/2 for cycle 4 of chemotherapy

## 2011-11-10 NOTE — Progress Notes (Signed)
OFFICE PROGRESS NOTE  CC  [name],[name], MD [address] [place] [address] Dr. Chipper Herb Dr. 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 Invasive ductal carcinoma (T3 NX MX).   #2 Patient has gone on to have right simple mastectomy with the final pathology revealing invasive and in situ ductal carcinoma. 2 nodules were seen 3 cm and 2.2 cm margins were negative there was focal lymphovascular space involvement by tumor. 1/15 sentinel node was positive for metastatic disease.  #3 the tumor was estrogen receptor +100% progesterone receptor +100% and HER-2/neu negative proliferation marker 53%. Final pathologic staging was P. T2 N1 A. MX.  # 4. Taxotere/Cytoxan q 3 weeks beginning on 09/22/11 on NSBP B-49  CURRENT THERAPY: s/p  cycle #3 of Taxotere Cytoxan with day 2 Neulasta.  INTERVAL HISTORY: [name] 61 y.o. female returns for Followup visit.  Overall patient tolerated cycle 3 of her chemotherapy very well. Her platelets are slightly low at 141 otherwise white count remains normal with an ANC of 1.8. Hemoglobin is slightly low but she is asymptomatic from her anemia. She denies any nausea vomiting fevers chills. She does develop some pain in her fingertips and her toes around day 7 after her treatment. I have given her a prescription for Percocets. I have also recommended that she take some B complex and folic acid. She has no bleeding problems. She is scheduled to be seen by her ophthalmologist and dentist and I have okayed the Appointments Remainder of the 10 point review of systems is negative.Marland Kitchen  MEDICAL HISTORY: Past Medical History  Diagnosis Date  . Hypertension   . Asthma   . Hyperlipemia   . Heart murmur   .  Arthritis   . OSA (obstructive sleep apnea)     CPAP settings- ?   . Blood transfusion     hx of last one in 1987  . 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 MRSA (methicillin resistant staph aureus) culture positive     08/2011    ALLERGIES:  is allergic to trandolapril-verapamil hcl er.  MEDICATIONS:  Current Outpatient Prescriptions  Medication Sig Dispense Refill  . albuterol (PROAIR HFA) 108 (90 BASE) MCG/ACT inhaler Inhale 2 puffs into the lungs every 6 (six) hours as needed. For shortness of breath      . cabergoline (DOSTINEX) 0.5 MG tablet Take 0.25 mg by mouth every Monday, Wednesday, and Friday.       Marland Kitchen dexamethasone (DECADRON) 4 MG tablet Take 2 tablets two times a day the day before Taxotere. Then take 2 tabs two times a day starting the day after chemo for 3 days.  30 tablet  1  . Fluticasone-Salmeterol (ADVAIR DISKUS) 100-50 MCG/DOSE AEPB Inhale 1 puff into the lungs daily after breakfast.       . furosemide (LASIX) 20 MG tablet Take 20 mg by mouth daily. Pt takes in am      . latanoprost (XALATAN) 0.005 % ophthalmic solution Place 1 drop into both eyes at bedtime.       . lidocaine-prilocaine (EMLA) cream Apply topically as needed. Apply to port 45 minutes prior to chemotherapy  30 g  4  . LORazepam (ATIVAN) 0.5 MG tablet  Take 1 tablet (0.5 mg total) by mouth every 6 (six) hours as needed (Nausea or vomiting).  30 tablet  0  . Multiple Vitamin (MULITIVITAMIN WITH MINERALS) TABS Take 1 tablet by mouth daily with breakfast.       . ondansetron (ZOFRAN) 8 MG tablet Take 1 tablet two times a day starting the day after chemo for 3 days. Then take 1 tab two times a day as needed for nausea or vomiting.  30 tablet  3  . prochlorperazine (COMPAZINE) 10 MG tablet Take 1 tablet (10 mg total) by mouth every 6 (six) hours as needed (Nausea or vomiting).  30 tablet  1  . prochlorperazine (COMPAZINE) 25 MG suppository Place 1 suppository (25 mg  total) rectally every 12 (twelve) hours as needed for nausea.  12 suppository  3  . simvastatin (ZOCOR) 20 MG tablet Take 20 mg by mouth daily with breakfast.       . valsartan-hydrochlorothiazide (DIOVAN-HCT) 160-25 MG per tablet Take 1 tablet by mouth daily with breakfast.         SURGICAL HISTORY:  Past Surgical History  Procedure Date  . Tubal ligation   . Dilation and curettage of uterus     x3  . Carpal tunnel release   . Hand surgery     2012  . Cholecystectomy 1987    open - Dr Wiliam Ke  . Mastectomy modified radical 08/14/11    right , ER/PR +, HER2 -  . Abdominal hysterectomy 2007    TAH/BSO  . Breast surgery     mastectomy  . Port-a-cath insertion 09/11/11  . Portacath placement 09/11/2011    Procedure: INSERTION PORT-A-CATH;  Surgeon: Clovis Pu. Cornett, MD;  Location: [facility];  Service: General;  Laterality: N/A;  Insert of Port    REVIEW OF SYSTEMS:  Pertinent items are noted in HPI.   PHYSICAL EXAMINATION: General appearance: alert, cooperative and appears stated age Neck: no adenopathy, no carotid bruit, no JVD, supple, symmetrical, trachea midline and thyroid not enlarged, symmetric, no tenderness/mass/nodules Lymph nodes: Cervical, supraclavicular, and axillary nodes normal. Resp: clear to auscultation bilaterally and normal percussion bilaterally Back: symmetric, no curvature. ROM normal. No CVA tenderness. Cardio: regular rate and rhythm, S1, S2 normal, no murmur, click, rub or gallop GI: soft, non-tender; bowel sounds normal; no masses,  no organomegaly Extremities: extremities normal, atraumatic, no cyanosis or edema Neurologic: Grossly normal Right mastectomy scar is healing. Left breast no masses or nipple discharge.  ECOG PERFORMANCE STATUS: 1 - Symptomatic but completely ambulatory  Blood pressure 134/76, pulse 116, temperature 97.7 F (36.5 C), temperature source Oral, height 5\' 2"  (1.575 m), weight 294 lb 1.6 oz (133.403 kg).  LABORATORY DATA: Lab  Results  Component Value Date   WBC 5.0 11/10/2011   HGB 10.3* 11/10/2011   HCT 31.0* 11/10/2011   MCV 90.0 11/10/2011   PLT 141* 11/10/2011      Chemistry      Component Value Date/Time   NA 137 11/03/2011 1253   K 3.7 11/03/2011 1253   CL 101 11/03/2011 1253   CO2 26 11/03/2011 1253   BUN 17 11/03/2011 1253   CREATININE 0.82 11/03/2011 1253      Component Value Date/Time   CALCIUM 9.7 11/03/2011 1253   ALKPHOS 67 11/03/2011 1253   AST 12 11/03/2011 1253   ALT 12 11/03/2011 1253   BILITOT 0.8 11/03/2011 1253       ASSESSMENT: 61 year old female with  #1 stage IIB invasive ductal carcinoma  of the right breast she is now status post mastectomy with axillary lymph node dissection. The final pathology revealed multifocal disease the largest tumor was 3 cm with a second tumor measuring 2.2 cm with focal lymphovascular space involvement. One of 15 lymph nodes were positive for metastatic disease. Postoperatively patient is doing well. The tumor was ER +100% PR +100% HER-2/neu negative with a proliferation marker 53%.  #2 It has been enrolled on clinical trial NSABP B-49 utilizing Taxotere Cytoxan x6 cycles versus an anthracycline. Patient was randomized to Taxotere Cytoxan every 3 weeks for a total of 6 cycles. We have discussed this with the patient. She met with our clinical research nurse Dondra Spry lot. Risks and benefits of treatment have been discussed.  #3 patient is now status post 3 cycles of Taxotere and Cytoxan she tolerating it well. She does develop a little bit of peripheral paresthesias. Otherwise no other complaints    PLAN:  #1 for her pain I have given her a prescription for Percocet take 1 pill every 6 hours as needed.  #2 she will keep her appointments with your dentist and ophthalmologist.  #3 her next chemotherapy will be administered on 11/25/2011.  All questions were answered. The patient knows to call the clinic with any problems, questions or concerns. We can certainly  see the patient much sooner if necessary.  I spent 30 minutes counseling the patient face to face. The total time spent in the appointment was 30 minutes.    Drue Second, MD Medical/Oncology [facility] [phone] (beeper) [phone] (Office)  11/10/2011, 1:40 PM

## 2011-11-17 ENCOUNTER — Encounter: Payer: Medicare Other | Admitting: Physical Therapy

## 2011-11-18 ENCOUNTER — Ambulatory Visit: Payer: Medicare Other | Admitting: Physical Therapy

## 2011-11-19 ENCOUNTER — Ambulatory Visit: Payer: Medicare Other | Admitting: Physical Therapy

## 2011-11-21 ENCOUNTER — Ambulatory Visit: Payer: Medicare Other | Admitting: Physical Therapy

## 2011-11-21 ENCOUNTER — Encounter: Payer: Self-pay | Admitting: Pulmonary Disease

## 2011-11-21 ENCOUNTER — Ambulatory Visit (INDEPENDENT_AMBULATORY_CARE_PROVIDER_SITE_OTHER): Payer: Medicare Other | Admitting: Pulmonary Disease

## 2011-11-21 VITALS — BP 128/64 | HR 86 | Temp 98.2°F | Ht 62.0 in | Wt 300.2 lb

## 2011-11-21 DIAGNOSIS — G4733 Obstructive sleep apnea (adult) (pediatric): Secondary | ICD-10-CM

## 2011-11-21 NOTE — Patient Instructions (Addendum)
Continue with cpap, and keep up with mask changes and supplies. Work on weight loss followup with me in one year, but call if having issues with your equipment.

## 2011-11-21 NOTE — Assessment & Plan Note (Signed)
The patient continues to do well with her CPAP treatment, and is satisfied with her sleep and daytime alertness.  I've asked her to keep up with mask changes and supplies, and to work aggressively on weight loss.  If she is doing well, I would like to see her back in one year.

## 2011-11-21 NOTE — Progress Notes (Signed)
  Subjective:    Patient ID: [name], female    DOB: [birth year], 61 y.o.   MRN: [id number]  HPI The patient comes in today for followup of her known obstructive sleep apnea.  She is been wearing CPAP compliantly without issues, and feels that she is sleeping well with adequate daytime alertness.  She has been keeping up with her supplies and mask changes, and is having no significant leaks.     Review of Systems  Constitutional: Positive for unexpected weight change. Negative for fever.       Swelling from the chemo  HENT: Positive for rhinorrhea and sneezing. Negative for ear pain, nosebleeds, congestion, sore throat, trouble swallowing, dental problem, postnasal drip and sinus pressure.   Eyes: Negative for redness and itching.  Respiratory: Positive for cough, shortness of breath and wheezing. Negative for chest tightness.   Cardiovascular: Positive for leg swelling. Negative for palpitations.  Gastrointestinal: Negative for nausea and vomiting.  Genitourinary: Positive for dysuria.       With chemo  Musculoskeletal: Positive for joint swelling.  Skin: Negative for rash.  Neurological: Negative for headaches.  Hematological: Does not bruise/bleed easily.  Psychiatric/Behavioral: Negative for dysphoric mood. The patient is nervous/anxious.        Objective:   Physical Exam Overweight female in no acute distress Nose without purulence or discharge noted No skin breakdown or pressure necrosis from the CPAP mask Lower extremities with edema, no cyanosis Alert and oriented, moves all 4 extremities.       Assessment & Plan:

## 2011-11-24 ENCOUNTER — Ambulatory Visit: Payer: Medicare Other

## 2011-11-24 ENCOUNTER — Telehealth: Payer: Self-pay | Admitting: *Deleted

## 2011-11-24 ENCOUNTER — Other Ambulatory Visit: Payer: Self-pay | Admitting: *Deleted

## 2011-11-24 DIAGNOSIS — C50319 Malignant neoplasm of lower-inner quadrant of unspecified female breast: Secondary | ICD-10-CM

## 2011-11-24 NOTE — Telephone Encounter (Signed)
Patient called reporting she started her dexamethasone pre-meds yesterday thinking she was to be treated today.  Chemotherapy appt. Is 11-25-2011.  Asking how to take the dexamethasone due to this error.  Reportedly has eighteen pills left.    Instructed to take pills today and ask MD tomorrow at f/u what to do the day after treatment.

## 2011-11-25 ENCOUNTER — Ambulatory Visit (HOSPITAL_BASED_OUTPATIENT_CLINIC_OR_DEPARTMENT_OTHER): Payer: Medicare Other | Admitting: Oncology

## 2011-11-25 ENCOUNTER — Telehealth: Payer: Self-pay | Admitting: Oncology

## 2011-11-25 ENCOUNTER — Other Ambulatory Visit (HOSPITAL_BASED_OUTPATIENT_CLINIC_OR_DEPARTMENT_OTHER): Payer: Medicare Other | Admitting: Lab

## 2011-11-25 ENCOUNTER — Encounter: Payer: Self-pay | Admitting: Oncology

## 2011-11-25 ENCOUNTER — Ambulatory Visit (HOSPITAL_BASED_OUTPATIENT_CLINIC_OR_DEPARTMENT_OTHER): Payer: Medicare Other

## 2011-11-25 ENCOUNTER — Encounter: Payer: Self-pay | Admitting: *Deleted

## 2011-11-25 VITALS — BP 143/74 | HR 77 | Temp 97.9°F | Ht 62.0 in | Wt 302.7 lb

## 2011-11-25 DIAGNOSIS — C50319 Malignant neoplasm of lower-inner quadrant of unspecified female breast: Secondary | ICD-10-CM

## 2011-11-25 DIAGNOSIS — E8779 Other fluid overload: Secondary | ICD-10-CM

## 2011-11-25 DIAGNOSIS — Z5111 Encounter for antineoplastic chemotherapy: Secondary | ICD-10-CM

## 2011-11-25 DIAGNOSIS — C773 Secondary and unspecified malignant neoplasm of axilla and upper limb lymph nodes: Secondary | ICD-10-CM

## 2011-11-25 DIAGNOSIS — R609 Edema, unspecified: Secondary | ICD-10-CM

## 2011-11-25 LAB — COMPREHENSIVE METABOLIC PANEL
Albumin: 3.6 g/dL (ref 3.5–5.2)
Alkaline Phosphatase: 48 U/L (ref 39–117)
BUN: 21 mg/dL (ref 6–23)
CO2: 26 mEq/L (ref 19–32)
Calcium: 9.7 mg/dL (ref 8.4–10.5)
Chloride: 102 mEq/L (ref 96–112)
Glucose, Bld: 116 mg/dL — ABNORMAL HIGH (ref 70–99)
Potassium: 3.9 mEq/L (ref 3.5–5.3)
Total Protein: 6.5 g/dL (ref 6.0–8.3)

## 2011-11-25 LAB — CBC WITH DIFFERENTIAL/PLATELET
Basophils Absolute: 0 10*3/uL (ref 0.0–0.1)
Eosinophils Absolute: 0 10*3/uL (ref 0.0–0.5)
HGB: 10.1 g/dL — ABNORMAL LOW (ref 11.6–15.9)
MCV: 90.6 fL (ref 79.5–101.0)
MONO#: 1 10*3/uL — ABNORMAL HIGH (ref 0.1–0.9)
MONO%: 7.7 % (ref 0.0–14.0)
NEUT#: 11.1 10*3/uL — ABNORMAL HIGH (ref 1.5–6.5)
RDW: 18.7 % — ABNORMAL HIGH (ref 11.2–14.5)
WBC: 13.4 10*3/uL — ABNORMAL HIGH (ref 3.9–10.3)

## 2011-11-25 MED ORDER — ONDANSETRON 16 MG/50ML IVPB (CHCC)
16.0000 mg | Freq: Once | INTRAVENOUS | Status: AC
  Administered 2011-11-25: 16 mg via INTRAVENOUS

## 2011-11-25 MED ORDER — DEXAMETHASONE SODIUM PHOSPHATE 4 MG/ML IJ SOLN
20.0000 mg | Freq: Once | INTRAMUSCULAR | Status: AC
  Administered 2011-11-25: 20 mg via INTRAVENOUS

## 2011-11-25 MED ORDER — DOCETAXEL CHEMO INJECTION 160 MG/16ML
75.0000 mg/m2 | Freq: Once | INTRAVENOUS | Status: AC
  Administered 2011-11-25: 180 mg via INTRAVENOUS
  Filled 2011-11-25: qty 18

## 2011-11-25 MED ORDER — SODIUM CHLORIDE 0.9 % IJ SOLN
10.0000 mL | INTRAMUSCULAR | Status: DC | PRN
  Administered 2011-11-25: 10 mL
  Filled 2011-11-25: qty 10

## 2011-11-25 MED ORDER — SODIUM CHLORIDE 0.9 % IV SOLN
Freq: Once | INTRAVENOUS | Status: AC
  Administered 2011-11-25: 12:00:00 via INTRAVENOUS

## 2011-11-25 MED ORDER — SODIUM CHLORIDE 0.9 % IV SOLN
600.0000 mg/m2 | Freq: Once | INTRAVENOUS | Status: AC
  Administered 2011-11-25: 1440 mg via INTRAVENOUS
  Filled 2011-11-25: qty 72

## 2011-11-25 MED ORDER — HEPARIN SOD (PORK) LOCK FLUSH 100 UNIT/ML IV SOLN
500.0000 [IU] | Freq: Once | INTRAVENOUS | Status: AC | PRN
  Administered 2011-11-25: 500 [IU]
  Filled 2011-11-25: qty 5

## 2011-11-25 NOTE — Patient Instructions (Signed)
 Cancer Center Discharge Instructions for Patients Receiving Chemotherapy  Today you received the following chemotherapy agents Taxotere and Cytoxan  To help prevent nausea and vomiting after your treatment, we encourage you to take your nausea medication as directed by your MD.   If you develop nausea and vomiting that is not controlled by your nausea medication, call the clinic. If it is after clinic hours your family physician or the after hours number for the clinic or go to the Emergency Department.   BELOW ARE SYMPTOMS THAT SHOULD BE REPORTED IMMEDIATELY:  *FEVER GREATER THAN 100.5 F  *CHILLS WITH OR WITHOUT FEVER  NAUSEA AND VOMITING THAT IS NOT CONTROLLED WITH YOUR NAUSEA MEDICATION  *UNUSUAL SHORTNESS OF BREATH  *UNUSUAL BRUISING OR BLEEDING  TENDERNESS IN MOUTH AND THROAT WITH OR WITHOUT PRESENCE OF ULCERS  *URINARY PROBLEMS  *BOWEL PROBLEMS  UNUSUAL RASH Items with * indicate a potential emergency and should be followed up as soon as possible.  Feel free to call the clinic you have any questions or concerns. The clinic phone number is [phone].

## 2011-11-25 NOTE — Progress Notes (Signed)
11/25/11 @ 1045, NSABP B-49, Cycle 4:  Mrs. Thaden into the [facility] for her 4th of 6 cycles of TC.  She is doing well without any specific complaints.  Her labs are all appropriate to proceed with treatment.  Spoke with Faith Rogue, RN in the infusion room and provided her with a sign that all treatment is given per institutional standards.

## 2011-11-25 NOTE — Telephone Encounter (Signed)
gve the pt her July 2013 appt calendar °

## 2011-11-25 NOTE — Patient Instructions (Addendum)
Proceed with chemotherapy.  Take lasix that you have at already daily in the mornings.

## 2011-11-25 NOTE — Progress Notes (Signed)
OFFICE PROGRESS NOTE  CC  [name],Kathryn A, MD [address] [place] [address] Dr. Chipper Herb Dr. 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 Invasive ductal carcinoma (T3 NX MX).   #2 Patient has gone on to have right simple mastectomy with the final pathology revealing invasive and in situ ductal carcinoma. 2 nodules were seen 3 cm and 2.2 cm margins were negative there was focal lymphovascular space involvement by tumor. 1/15 sentinel node was positive for metastatic disease.  #3 the tumor was estrogen receptor +100% progesterone receptor +100% and HER-2/neu negative proliferation marker 53%. Final pathologic staging was P. T2 N1 A. MX.  # 4. Taxotere/Cytoxan q 3 weeks beginning on 09/22/11 on NSBP B-49  CURRENT THERAPY: s/p  cycle #3 of Taxotere Cytoxan with day 2 Neulasta.  INTERVAL HISTORY: [name] 61 y.o. female returns for Followup visit.  She is accompanied by her husband today. Patient's main concern today is fatigue as well as generalized body swelling especially in her arms and her legs. She does have right upper extremity lymphedema and she is undergoing therapy for that. She's also noted some swelling in her lower extremities up to the ankles and she is concerned about this she is however not having any difficulty with ambulation. She denies any nausea or vomiting fevers chills or night sweats. She occasionally does have some neuropathic type of pain in her fingertips and her toes But it is transient. She has no dizziness. No bleeding. Remainder of the 10 point review of systems is negative. MEDICAL HISTORY: Past Medical History  Diagnosis Date  . Hypertension   . Asthma   . Hyperlipemia   . Heart murmur   .  Arthritis   . OSA (obstructive sleep apnea)     CPAP settings- ?   . Blood transfusion     hx of last one in 1987  . 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 MRSA (methicillin resistant staph aureus) culture positive     08/2011  . Breast cancer     ALLERGIES:  is allergic to trandolapril-verapamil hcl er.  MEDICATIONS:  Current Outpatient Prescriptions  Medication Sig Dispense Refill  . albuterol (PROAIR HFA) 108 (90 BASE) MCG/ACT inhaler Inhale 2 puffs into the lungs every 6 (six) hours as needed. For shortness of breath      . cabergoline (DOSTINEX) 0.5 MG tablet Take 0.25 mg by mouth every Monday, Wednesday, and Friday.       . cyanocobalamin 500 MCG tablet Take 500 mcg by mouth daily.      Marland Kitchen dexamethasone (DECADRON) 4 MG tablet Take 2 tablets two times a day the day before Taxotere. Then take 2 tabs two times a day starting the day after chemo for 3 days.  30 tablet  1  . Fluticasone-Salmeterol (ADVAIR DISKUS) 100-50 MCG/DOSE AEPB Inhale 1 puff into the lungs daily after breakfast.       . furosemide (LASIX) 20 MG tablet Take 20 mg by mouth daily. Pt takes in am      . HYDROcodone-acetaminophen (VICODIN) 5-500 MG per tablet Take 1 tablet by mouth every 6 (six) hours as needed.      . latanoprost (XALATAN) 0.005 % ophthalmic solution  Place 1 drop into both eyes at bedtime.       . lidocaine-prilocaine (EMLA) cream Apply topically as needed. Apply to port 45 minutes prior to chemotherapy  30 g  4  . loratadine (CLARITIN) 10 MG tablet Take 10 mg by mouth daily. Take 1 tablet with Chemo      . LORazepam (ATIVAN) 0.5 MG tablet Take 1 tablet (0.5 mg total) by mouth every 6 (six) hours as needed (Nausea or vomiting).  30 tablet  0  . Multiple Vitamin (MULITIVITAMIN WITH MINERALS) TABS Take 1 tablet by mouth daily with breakfast.       . Multiple Vitamins-Minerals ([employer] DAILY MULTIPLE FOR WOMEN) TABS Take 1 tablet by mouth daily.      . ondansetron  (ZOFRAN) 8 MG tablet Take 1 tablet two times a day starting the day after chemo for 3 days. Then take 1 tab two times a day as needed for nausea or vomiting.  30 tablet  3  . prochlorperazine (COMPAZINE) 10 MG tablet Take 1 tablet (10 mg total) by mouth every 6 (six) hours as needed (Nausea or vomiting).  30 tablet  1  . prochlorperazine (COMPAZINE) 25 MG suppository Place 1 suppository (25 mg total) rectally every 12 (twelve) hours as needed for nausea.  12 suppository  3  . simvastatin (ZOCOR) 20 MG tablet Take 20 mg by mouth daily with breakfast.       . valsartan-hydrochlorothiazide (DIOVAN-HCT) 160-25 MG per tablet Take 1 tablet by mouth daily with breakfast.         SURGICAL HISTORY:  Past Surgical History  Procedure Date  . Tubal ligation   . Dilation and curettage of uterus     x3  . Carpal tunnel release   . Hand surgery     2012  . Cholecystectomy 1987    open - Dr Wiliam Ke  . Mastectomy modified radical 08/14/11    right , ER/PR +, HER2 -  . Abdominal hysterectomy 2007    TAH/BSO  . Breast surgery     mastectomy RIGHT  . Port-a-cath insertion 09/11/11  . Portacath placement 09/11/2011    Procedure: INSERTION PORT-A-CATH;  Surgeon: Clovis Pu. Cornett, MD;  Location: [facility];  Service: General;  Laterality: N/A;  Insert of Port    REVIEW OF SYSTEMS:  Pertinent items are noted in HPI.   PHYSICAL EXAMINATION: General appearance: alert, cooperative and appears stated age Neck: no adenopathy, no carotid bruit, no JVD, supple, symmetrical, trachea midline and thyroid not enlarged, symmetric, no tenderness/mass/nodules Lymph nodes: Cervical, supraclavicular, and axillary nodes normal. Resp: clear to auscultation bilaterally and normal percussion bilaterally Back: symmetric, no curvature. ROM normal. No CVA tenderness. Cardio: regular rate and rhythm, S1, S2 normal, no murmur, click, rub or gallop GI: soft, non-tender; bowel sounds normal; no masses,  no organomegaly Extremities:  extremities normal, atraumatic, no cyanosis or edema Neurologic: Grossly normal Right mastectomy scar is healing. Left breast no masses or nipple discharge.  ECOG PERFORMANCE STATUS: 1 - Symptomatic but completely ambulatory  Blood pressure 143/74, pulse 77, temperature 97.9 F (36.6 C), temperature source Oral, height 5\' 2"  (1.575 m), weight 302 lb 11.2 oz (137.304 kg).  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 The final pathology revealed multifocal disease the largest tumor was 3 cm with a second tumor measuring 2.2 cm with focal lymphovascular space involvement. One of 15 lymph nodes were positive for metastatic disease. Postoperatively patient is doing well. The tumor was ER +100% PR +100% HER-2/neu negative with a proliferation marker 53%.  #2 It has been enrolled on clinical trial NSABP B-49 utilizing Taxotere Cytoxan x6 cycles versus an anthracycline. Patient was randomized to Taxotere Cytoxan every 3 weeks for a total of 6 cycles. We have discussed this with the patient. She met with our clinical research nurse Dondra Spry lot. Risks and benefits of treatment have been discussed.  #3 patient is here for cycle 4 of Taxotere and Cytoxan she tolerating it well. She does develop a little bit of peripheral paresthesias. Otherwise no other  complaints  #4 swelling of lower and upper extremities secondary to fluid retention.    PLAN:  #1 patient will proceed with cycle #4 of Taxotere and Cytoxan with day 2 Neulasta given tomorrow.  #2 she does have Lasix at home and I have recommended that she take it every morning. She has 20 mg tablets.  #3 I will plan on seeing her back in one week's time in followup.  All questions were answered. The patient knows to call the clinic with any problems, questions or concerns. We can certainly see the patient much sooner if necessary.  I spent 30 minutes counseling the patient face to face. The total time spent in the appointment was 30 minutes.    Drue Second, MD Medical/Oncology [facility] [phone] (beeper) [phone] (Office)  11/25/2011, 10:29 AM

## 2011-11-26 ENCOUNTER — Ambulatory Visit (HOSPITAL_BASED_OUTPATIENT_CLINIC_OR_DEPARTMENT_OTHER): Payer: Medicare Other

## 2011-11-26 VITALS — BP 144/69 | HR 96 | Temp 97.0°F

## 2011-11-26 DIAGNOSIS — C773 Secondary and unspecified malignant neoplasm of axilla and upper limb lymph nodes: Secondary | ICD-10-CM

## 2011-11-26 DIAGNOSIS — C50319 Malignant neoplasm of lower-inner quadrant of unspecified female breast: Secondary | ICD-10-CM

## 2011-11-26 MED ORDER — PEGFILGRASTIM INJECTION 6 MG/0.6ML
6.0000 mg | Freq: Once | SUBCUTANEOUS | Status: AC
  Administered 2011-11-26: 6 mg via SUBCUTANEOUS
  Filled 2011-11-26: qty 0.6

## 2011-11-28 ENCOUNTER — Ambulatory Visit (INDEPENDENT_AMBULATORY_CARE_PROVIDER_SITE_OTHER): Payer: Medicare Other | Admitting: Surgery

## 2011-11-28 ENCOUNTER — Encounter (INDEPENDENT_AMBULATORY_CARE_PROVIDER_SITE_OTHER): Payer: Self-pay | Admitting: Surgery

## 2011-11-28 VITALS — BP 142/70 | HR 84 | Temp 97.2°F | Resp 24 | Ht 62.0 in | Wt 292.8 lb

## 2011-11-28 DIAGNOSIS — C50319 Malignant neoplasm of lower-inner quadrant of unspecified female breast: Secondary | ICD-10-CM

## 2011-11-28 NOTE — Patient Instructions (Signed)
See me again in about two months, around the time you start radiation

## 2011-11-28 NOTE — Progress Notes (Signed)
NAME: [name]       DOB: [birth year]           DATE: 11/28/2011       MRN: [id number]   TKAI LARGE is a 61 y.o.Marland Kitchenfemale who presents for routine followup of her Right breast cancerdiagnosed in 2013 and treated with Mastectomy and now chemotherapy with plans for radiation therapy to start after the completion of chemotherapy. She has no problems or concerns on either side.  PFSH: She has had no significant changes since the last visit here.  ROS: There have been no significant changes since the last visit here  EXAM:  VS: BP 142/70  Pulse 84  Temp 97.2 F (36.2 C) (Temporal)  Resp 24  Ht 5\' 2"  (1.575 m)  Wt 292 lb 12.8 oz (132.813 kg)  BMI 53.55 kg/m2   General: The patient is alert, oriented, generally healthy appearing, NAD. Mood and affect are normal.  Breasts:  The right breast is surgically absent. This does local recurrence or other problems. Port-A-Cath appears to be intact with no issues.  Lymphatics: She has no axillary or supraclavicular adenopathy on either side.  Extremities: Full ROM of the surgical side with  Mild lymphedema noted.  Data Reviewed: No new data  Impression: Doing well, with no evidence of recurrent cancer or new cancer  Plan: She'll come back to see me in a couple of months, between the completion of chemotherapy in the beginning of radiation therapy

## 2011-12-01 ENCOUNTER — Ambulatory Visit: Payer: Medicare Other | Attending: Oncology | Admitting: Physical Therapy

## 2011-12-01 DIAGNOSIS — I890 Lymphedema, not elsewhere classified: Secondary | ICD-10-CM | POA: Insufficient documentation

## 2011-12-01 DIAGNOSIS — IMO0001 Reserved for inherently not codable concepts without codable children: Secondary | ICD-10-CM | POA: Insufficient documentation

## 2011-12-02 ENCOUNTER — Ambulatory Visit (HOSPITAL_BASED_OUTPATIENT_CLINIC_OR_DEPARTMENT_OTHER): Payer: Medicare Other | Admitting: Oncology

## 2011-12-02 ENCOUNTER — Other Ambulatory Visit (HOSPITAL_BASED_OUTPATIENT_CLINIC_OR_DEPARTMENT_OTHER): Payer: Medicare Other | Admitting: Lab

## 2011-12-02 ENCOUNTER — Telehealth: Payer: Self-pay | Admitting: Oncology

## 2011-12-02 ENCOUNTER — Encounter: Payer: Self-pay | Admitting: Oncology

## 2011-12-02 VITALS — BP 130/74 | HR 111 | Temp 98.3°F | Ht 62.0 in | Wt 295.2 lb

## 2011-12-02 DIAGNOSIS — E8779 Other fluid overload: Secondary | ICD-10-CM

## 2011-12-02 DIAGNOSIS — Z170 Estrogen receptor positive status [ER+]: Secondary | ICD-10-CM

## 2011-12-02 DIAGNOSIS — C50319 Malignant neoplasm of lower-inner quadrant of unspecified female breast: Secondary | ICD-10-CM

## 2011-12-02 DIAGNOSIS — R209 Unspecified disturbances of skin sensation: Secondary | ICD-10-CM

## 2011-12-02 LAB — COMPREHENSIVE METABOLIC PANEL
ALT: 11 U/L (ref 0–35)
AST: 14 U/L (ref 0–37)
BUN: 18 mg/dL (ref 6–23)
Calcium: 9.1 mg/dL (ref 8.4–10.5)
Creatinine, Ser: 0.97 mg/dL (ref 0.50–1.10)
Total Bilirubin: 1 mg/dL (ref 0.3–1.2)

## 2011-12-02 LAB — CBC WITH DIFFERENTIAL/PLATELET
BASO%: 1.1 % (ref 0.0–2.0)
Basophils Absolute: 0 10*3/uL (ref 0.0–0.1)
EOS%: 2 % (ref 0.0–7.0)
HCT: 29.7 % — ABNORMAL LOW (ref 34.8–46.6)
HGB: 10 g/dL — ABNORMAL LOW (ref 11.6–15.9)
LYMPH%: 30.1 % (ref 14.0–49.7)
MCH: 30.6 pg (ref 25.1–34.0)
MCHC: 33.7 g/dL (ref 31.5–36.0)
MCV: 90.8 fL (ref 79.5–101.0)
NEUT%: 39.6 % (ref 38.4–76.8)
Platelets: 150 10*3/uL (ref 145–400)

## 2011-12-02 MED ORDER — DEXAMETHASONE 4 MG PO TABS: ORAL_TABLET | ORAL | Status: DC

## 2011-12-02 NOTE — Patient Instructions (Addendum)
1. You are doing well. Blood work today looks good.  2. i will see you back on 12/15/11

## 2011-12-02 NOTE — Progress Notes (Signed)
OFFICE PROGRESS NOTE  CC  [name],[name], [name] [address] [place] [address] Dr. Chipper Herb Dr. 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 Invasive ductal carcinoma (T3 NX MX).   #2 Patient has gone on to have right simple mastectomy with the final pathology revealing invasive and in situ ductal carcinoma. 2 nodules were seen 3 cm and 2.2 cm margins were negative there was focal lymphovascular space involvement by tumor. 1/15 sentinel node was positive for metastatic disease.  #3 the tumor was estrogen receptor +100% progesterone receptor +100% and HER-2/neu negative proliferation marker 53%. Final pathologic staging was pT2 N1 [name]. MX.  # 4. Taxotere/Cytoxan q 3 weeks beginning on 09/22/11 on NSBP B-49  CURRENT THERAPY: s/p  cycle #4 of Taxotere Cytoxan with day 2 Neulasta.  INTERVAL HISTORY: [name] 61 y.o. female returns for Followup visit.  Clinically patient seems to be doing well she certainly seems to be short of breath although her hemoglobin is relatively stable in comparison to last week. Her white count also is holding up quite nicely. She does get day 2 Neulasta and certainly is helping to maintain her white count. She has been eating well I do think that she is gaining some weight. She denies otherwise any fevers chills night sweats headaches chest pains shortness of breath myalgias she does have some swelling in her lower extremities as well as upper extremities. She does have some grade 1 paresthesias in her fingertips. She has no bleeding. Remainder of the 10 point review of systems is negative.  MEDICAL HISTORY: Past Medical History  Diagnosis Date  . Hypertension   . Asthma   . Hyperlipemia   . Heart murmur    . Arthritis   . OSA (obstructive sleep apnea)     CPAP settings- ?   . Blood transfusion     hx of last one in 1987  . 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 MRSA (methicillin resistant staph aureus) culture positive     08/2011  . Breast cancer     ALLERGIES:  is allergic to trandolapril-verapamil hcl er.  MEDICATIONS:  Current Outpatient Prescriptions  Medication Sig Dispense Refill  . albuterol (PROAIR HFA) 108 (90 BASE) MCG/ACT inhaler Inhale 2 puffs into the lungs every 6 (six) hours as needed. For shortness of breath      . cabergoline (DOSTINEX) 0.5 MG tablet Take 0.25 mg by mouth every Monday, Wednesday, and Friday.       . cyanocobalamin 500 MCG tablet Take 500 mcg by mouth daily.      Marland Kitchen dexamethasone (DECADRON) 4 MG tablet Take 2 tablets two times [name] the day before Taxotere. Then take 2 tabs two times [name] starting the day after chemo for 3 days.  30 tablet  1  . Fluticasone-Salmeterol (ADVAIR DISKUS) 100-50 MCG/DOSE AEPB Inhale 1 puff into the lungs daily after breakfast.       . furosemide (LASIX) 20 MG tablet Take 20 mg by mouth daily. Pt takes in am      . HYDROcodone-acetaminophen (VICODIN) 5-500 MG per tablet Take 1 tablet by mouth every 6 (six) hours as needed.      . latanoprost (XALATAN) 0.005 %  ophthalmic solution Place 1 drop into both eyes at bedtime.       . lidocaine-prilocaine (EMLA) cream Apply topically as needed. Apply to port 45 minutes prior to chemotherapy  30 g  4  . loratadine (CLARITIN) 10 MG tablet Take 10 mg by mouth daily. Take 1 tablet with Chemo      . LORazepam (ATIVAN) 0.5 MG tablet Take 1 tablet (0.5 mg total) by mouth every 6 (six) hours as needed (Nausea or vomiting).  30 tablet  0  . Multiple Vitamin (MULITIVITAMIN WITH MINERALS) TABS Take 1 tablet by mouth daily with breakfast.       . Multiple Vitamins-Minerals ([employer] DAILY MULTIPLE FOR WOMEN) TABS Take 1 tablet by mouth daily.      .  ondansetron (ZOFRAN) 8 MG tablet Take 1 tablet two times [name] starting the day after chemo for 3 days. Then take 1 tab two times [name] as needed for nausea or vomiting.  30 tablet  3  . prochlorperazine (COMPAZINE) 10 MG tablet Take 1 tablet (10 mg total) by mouth every 6 (six) hours as needed (Nausea or vomiting).  30 tablet  1  . prochlorperazine (COMPAZINE) 25 MG suppository Place 1 suppository (25 mg total) rectally every 12 (twelve) hours as needed for nausea.  12 suppository  3  . simvastatin (ZOCOR) 20 MG tablet Take 20 mg by mouth daily with breakfast.       . valsartan-hydrochlorothiazide (DIOVAN-HCT) 160-25 MG per tablet Take 1 tablet by mouth daily with breakfast.         SURGICAL HISTORY:  Past Surgical History  Procedure Date  . Tubal ligation   . Dilation and curettage of uterus     x3  . Carpal tunnel release   . Hand surgery     2012  . Cholecystectomy 1987    open - Dr Wiliam Ke  . Mastectomy modified radical 08/14/11    right , ER/PR +, HER2 -  . Abdominal hysterectomy 2007    TAH/BSO  . Breast surgery     mastectomy RIGHT  . Port-[name]-cath insertion 09/11/11  . Portacath placement 09/11/2011    Procedure: INSERTION PORT-[name]-CATH;  Surgeon: Clovis Pu. Cornett, [name];  Location: [facility];  Service: General;  Laterality: N/[name];  Insert of Port    REVIEW OF SYSTEMS:  Pertinent items are noted in HPI.   PHYSICAL EXAMINATION: General appearance: alert, cooperative and appears stated age Neck: no adenopathy, no carotid bruit, no JVD, supple, symmetrical, trachea midline and thyroid not enlarged, symmetric, no tenderness/mass/nodules Lymph nodes: Cervical, supraclavicular, and axillary nodes normal. Resp: clear to auscultation bilaterally and normal percussion bilaterally Back: symmetric, no curvature. ROM normal. No CVA tenderness. Cardio: regular rate and rhythm, S1, S2 normal, no murmur, click, rub or gallop GI: soft, non-tender; bowel sounds normal; no masses,  no  organomegaly Extremities: extremities normal, atraumatic, no cyanosis or edema Neurologic: Grossly normal Right mastectomy scar is healing. Left breast no masses or nipple discharge.  ECOG PERFORMANCE STATUS: 1 - Symptomatic but completely ambulatory  Blood pressure 130/74, pulse 111, temperature 98.3 F (36.8 C), temperature source Oral, height 5\' 2"  (1.575 m), weight 295 lb 3.2 oz (133.902 kg).  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 The final pathology revealed multifocal disease the largest tumor was 3 cm with [name] second tumor measuring 2.2 cm with focal lymphovascular space involvement. One of 15 lymph nodes were positive for metastatic disease. Postoperatively patient is doing well. The tumor was ER +100% PR +100% HER-2/neu negative with [name] proliferation marker 53%.  #2 It has been enrolled on clinical trial NSABP B-49 utilizing Taxotere Cytoxan x 6 cycles versus an anthracycline. Patient was randomized to Taxotere Cytoxan every 3 weeks for [name] total of 6 cycles. We have discussed this with the patient. She met with our clinical research nurse Dondra Spry lot. Risks and benefits of treatment have been discussed.  #3 patient is s/p cycle 4 of Taxotere and Cytoxan she tolerating it well. She does develop [name] little bit of peripheral paresthesias. Otherwise  no other complaints  #4 swelling of lower and upper extremities secondary to fluid retention.    PLAN:   #1 Patient is now status post 4 cycles of Taxotere and Cytoxan. Thus far she is tolerating this very well. Although I do think she is developing grade 1 peripheral paresthesias especially in her fingertips but otherwise no other problems.  #2 patient does have right upper extremity lymphedema she is going to lymphedema clinic.   #3 patient will return on July 22 for cycle #5 of Taxotere Cytoxan following NSABP B. 49. She is scheduled to receive [name] total of 6 cycles.  All questions were answered. The patient knows to call the clinic with any problems, questions or concerns. We can certainly see the patient much sooner if necessary.  I spent 30 minutes counseling the patient face to face. The total time spent in the appointment was 30 minutes.    Drue Second, [name] Medical/Oncology [facility] [phone] (beeper) [phone] (Office)  12/02/2011, 11:21 AM

## 2011-12-02 NOTE — Telephone Encounter (Signed)
gve the pt her July-sept 2013 appt calendar

## 2011-12-03 ENCOUNTER — Ambulatory Visit: Payer: Medicare Other | Admitting: Physical Therapy

## 2011-12-05 ENCOUNTER — Ambulatory Visit: Payer: Medicare Other | Admitting: Physical Therapy

## 2011-12-08 ENCOUNTER — Ambulatory Visit: Payer: Medicare Other | Admitting: Physical Therapy

## 2011-12-10 ENCOUNTER — Ambulatory Visit: Payer: Medicare Other | Admitting: Physical Therapy

## 2011-12-12 ENCOUNTER — Ambulatory Visit: Payer: Medicare Other | Admitting: Physical Therapy

## 2011-12-15 ENCOUNTER — Ambulatory Visit (HOSPITAL_BASED_OUTPATIENT_CLINIC_OR_DEPARTMENT_OTHER): Payer: Medicare Other | Admitting: Oncology

## 2011-12-15 ENCOUNTER — Encounter: Payer: Medicare Other | Admitting: Physical Therapy

## 2011-12-15 ENCOUNTER — Other Ambulatory Visit (HOSPITAL_BASED_OUTPATIENT_CLINIC_OR_DEPARTMENT_OTHER): Payer: Medicare Other | Admitting: Lab

## 2011-12-15 ENCOUNTER — Ambulatory Visit (HOSPITAL_BASED_OUTPATIENT_CLINIC_OR_DEPARTMENT_OTHER): Payer: Medicare Other

## 2011-12-15 ENCOUNTER — Encounter: Payer: Self-pay | Admitting: Oncology

## 2011-12-15 ENCOUNTER — Encounter: Payer: Self-pay | Admitting: *Deleted

## 2011-12-15 VITALS — BP 148/77 | HR 112 | Temp 98.1°F | Ht 62.0 in | Wt 305.4 lb

## 2011-12-15 DIAGNOSIS — C50319 Malignant neoplasm of lower-inner quadrant of unspecified female breast: Secondary | ICD-10-CM

## 2011-12-15 DIAGNOSIS — E8779 Other fluid overload: Secondary | ICD-10-CM

## 2011-12-15 DIAGNOSIS — Z5111 Encounter for antineoplastic chemotherapy: Secondary | ICD-10-CM

## 2011-12-15 DIAGNOSIS — Z170 Estrogen receptor positive status [ER+]: Secondary | ICD-10-CM

## 2011-12-15 DIAGNOSIS — M7989 Other specified soft tissue disorders: Secondary | ICD-10-CM

## 2011-12-15 LAB — CBC WITH DIFFERENTIAL/PLATELET
Basophils Absolute: 0 10*3/uL (ref 0.0–0.1)
EOS%: 0 % (ref 0.0–7.0)
Eosinophils Absolute: 0 10*3/uL (ref 0.0–0.5)
HGB: 9.5 g/dL — ABNORMAL LOW (ref 11.6–15.9)
MCV: 89.5 fL (ref 79.5–101.0)
MONO%: 6.2 % (ref 0.0–14.0)
NEUT#: 11 10*3/uL — ABNORMAL HIGH (ref 1.5–6.5)
RBC: 3.23 10*6/uL — ABNORMAL LOW (ref 3.70–5.45)
RDW: 18.6 % — ABNORMAL HIGH (ref 11.2–14.5)
lymph#: 1 10*3/uL (ref 0.9–3.3)

## 2011-12-15 LAB — COMPREHENSIVE METABOLIC PANEL
AST: 15 U/L (ref 0–37)
Albumin: 3.5 g/dL (ref 3.5–5.2)
Alkaline Phosphatase: 63 U/L (ref 39–117)
BUN: 22 mg/dL (ref 6–23)
Calcium: 9.5 mg/dL (ref 8.4–10.5)
Chloride: 100 mEq/L (ref 96–112)
Glucose, Bld: 137 mg/dL — ABNORMAL HIGH (ref 70–99)
Potassium: 4.1 mEq/L (ref 3.5–5.3)
Sodium: 137 mEq/L (ref 135–145)
Total Protein: 6.6 g/dL (ref 6.0–8.3)

## 2011-12-15 MED ORDER — SODIUM CHLORIDE 0.9 % IV SOLN
600.0000 mg/m2 | Freq: Once | INTRAVENOUS | Status: AC
  Administered 2011-12-15: 1440 mg via INTRAVENOUS
  Filled 2011-12-15: qty 72

## 2011-12-15 MED ORDER — SODIUM CHLORIDE 0.9 % IV SOLN
Freq: Once | INTRAVENOUS | Status: AC
  Administered 2011-12-15: 13:00:00 via INTRAVENOUS

## 2011-12-15 MED ORDER — HEPARIN SOD (PORK) LOCK FLUSH 100 UNIT/ML IV SOLN
500.0000 [IU] | Freq: Once | INTRAVENOUS | Status: DC | PRN
  Filled 2011-12-15: qty 5

## 2011-12-15 MED ORDER — DEXAMETHASONE SODIUM PHOSPHATE 4 MG/ML IJ SOLN
20.0000 mg | Freq: Once | INTRAMUSCULAR | Status: AC
Start: 2011-12-15 — End: 2011-12-15
  Administered 2011-12-15: 20 mg via INTRAVENOUS

## 2011-12-15 MED ORDER — ONDANSETRON 16 MG/50ML IVPB (CHCC)
16.0000 mg | Freq: Once | INTRAVENOUS | Status: AC
  Administered 2011-12-15: 16 mg via INTRAVENOUS

## 2011-12-15 MED ORDER — SODIUM CHLORIDE 0.9 % IJ SOLN
10.0000 mL | INTRAMUSCULAR | Status: DC | PRN
  Filled 2011-12-15: qty 10

## 2011-12-15 MED ORDER — DOCETAXEL CHEMO INJECTION 160 MG/16ML
75.0000 mg/m2 | Freq: Once | INTRAVENOUS | Status: AC
  Administered 2011-12-15: 180 mg via INTRAVENOUS
  Filled 2011-12-15: qty 18

## 2011-12-15 NOTE — Progress Notes (Signed)
OFFICE PROGRESS NOTE  CC  [name],[name], MD [address] [place] [address] Dr. Chipper Herb Dr. 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 Invasive ductal carcinoma (T3 NX MX).   #2 Patient has gone on to have right simple mastectomy with the final pathology revealing invasive and in situ ductal carcinoma. 2 nodules were seen 3 cm and 2.2 cm margins were negative there was focal lymphovascular space involvement by tumor. 1/15 sentinel node was positive for metastatic disease.  #3 the tumor was estrogen receptor +100% progesterone receptor +100% and HER-2/neu negative proliferation marker 53%. Final pathologic staging was pT2 N1 A. MX.  # 4. Taxotere/Cytoxan q 3 weeks beginning on 09/22/11 on NSBP B-49  CURRENT THERAPY: Here for  cycle #5 of Taxotere Cytoxan with day 2 Neulasta.  INTERVAL HISTORY: [name] 61 y.o. female returns for Followup visit.  Overall patient is doing well she is short of breath on exertion her hemoglobin is 9.5. She also feels like she has gained quite a bit of weight especially 10 pounds over the last 1 week do to just fluid retention. She does have diuretics but she has not taken these for quite some time. Therefore she is going to start taking these again. She otherwise denies any nausea vomiting fevers chills night sweats headaches no chest pains or palpitations no myalgias or arthralgias. Remainder of the 10 point review of systems is negative. MEDICAL HISTORY: Past Medical History  Diagnosis Date  . Hypertension   . Asthma   . Hyperlipemia   . Heart murmur   . Arthritis   . OSA (obstructive sleep apnea)     CPAP settings- ?   . Blood transfusion     hx of last one in 1987  . Cancer of  lower-inner quadrant of female breast 07/24/2011    right breast cancer   . MRSA (methicillin resistant staph aureus) culture positive     08/2011  . Breast cancer     ALLERGIES:  is allergic to trandolapril-verapamil hcl er.  MEDICATIONS:  Current Outpatient Prescriptions  Medication Sig Dispense Refill  . albuterol (PROAIR HFA) 108 (90 BASE) MCG/ACT inhaler Inhale 2 puffs into the lungs every 6 (six) hours as needed. For shortness of breath      . cabergoline (DOSTINEX) 0.5 MG tablet Take 0.25 mg by mouth every Monday, Wednesday, and Friday.       . cyanocobalamin 500 MCG tablet Take 500 mcg by mouth daily.      Marland Kitchen dexamethasone (DECADRON) 4 MG tablet Take 2 tablets two times a day the day before Taxotere. Then take 2 tabs two times a day starting the day after chemo for 3 days.  30 tablet  1  . Fluticasone-Salmeterol (ADVAIR DISKUS) 100-50 MCG/DOSE AEPB Inhale 1 puff into the lungs daily after breakfast.       . furosemide (LASIX) 20 MG tablet Take 20 mg by mouth daily. Pt takes in am      . HYDROcodone-acetaminophen (VICODIN) 5-500 MG per tablet Take 1 tablet by mouth every 6 (six) hours as needed.      . latanoprost (XALATAN) 0.005 % ophthalmic solution Place 1 drop into both eyes at bedtime.       . lidocaine-prilocaine (EMLA) cream Apply topically  as needed. Apply to port 45 minutes prior to chemotherapy  30 g  4  . loratadine (CLARITIN) 10 MG tablet Take 10 mg by mouth daily. Take 1 tablet with Chemo      . LORazepam (ATIVAN) 0.5 MG tablet Take 1 tablet (0.5 mg total) by mouth every 6 (six) hours as needed (Nausea or vomiting).  30 tablet  0  . Multiple Vitamin (MULITIVITAMIN WITH MINERALS) TABS Take 1 tablet by mouth daily with breakfast.       . Multiple Vitamins-Minerals ([employer] DAILY MULTIPLE FOR WOMEN) TABS Take 1 tablet by mouth daily.      . ondansetron (ZOFRAN) 8 MG tablet Take 1 tablet two times a day starting the day after chemo for 3 days. Then take 1 tab two times a day as  needed for nausea or vomiting.  30 tablet  3  . prochlorperazine (COMPAZINE) 10 MG tablet Take 1 tablet (10 mg total) by mouth every 6 (six) hours as needed (Nausea or vomiting).  30 tablet  1  . prochlorperazine (COMPAZINE) 25 MG suppository Place 1 suppository (25 mg total) rectally every 12 (twelve) hours as needed for nausea.  12 suppository  3  . simvastatin (ZOCOR) 20 MG tablet Take 20 mg by mouth daily with breakfast.       . valsartan-hydrochlorothiazide (DIOVAN-HCT) 160-25 MG per tablet Take 1 tablet by mouth daily with breakfast.         SURGICAL HISTORY:  Past Surgical History  Procedure Date  . Tubal ligation   . Dilation and curettage of uterus     x3  . Carpal tunnel release   . Hand surgery     2012  . Cholecystectomy 1987    open - Dr Wiliam Ke  . Mastectomy modified radical 08/14/11    right , ER/PR +, HER2 -  . Abdominal hysterectomy 2007    TAH/BSO  . Breast surgery     mastectomy RIGHT  . Port-a-cath insertion 09/11/11  . Portacath placement 09/11/2011    Procedure: INSERTION PORT-A-CATH;  Surgeon: Clovis Pu. Cornett, MD;  Location: [facility];  Service: General;  Laterality: N/A;  Insert of Port    REVIEW OF SYSTEMS:  Pertinent items are noted in HPI.   PHYSICAL EXAMINATION: General appearance: alert, cooperative and appears stated age Neck: no adenopathy, no carotid bruit, no JVD, supple, symmetrical, trachea midline and thyroid not enlarged, symmetric, no tenderness/mass/nodules Lymph nodes: Cervical, supraclavicular, and axillary nodes normal. Resp: clear to auscultation bilaterally and normal percussion bilaterally Back: symmetric, no curvature. ROM normal. No CVA tenderness. Cardio: regular rate and rhythm, S1, S2 normal, no murmur, click, rub or gallop GI: soft, non-tender; bowel sounds normal; no masses,  no organomegaly Extremities: extremities normal, atraumatic, no cyanosis or edema Neurologic: Grossly normal Right mastectomy scar is healing. Left breast no  masses or nipple discharge.  ECOG PERFORMANCE STATUS: 1 - Symptomatic but completely ambulatory  Blood pressure 148/77, pulse 112, temperature 98.1 F (36.7 C), temperature source Oral, height 5\' 2"  (1.575 m), weight 305 lb 6.4 oz (138.529 kg).  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 The final pathology revealed multifocal disease the largest tumor was 3 cm with a second tumor measuring 2.2 cm with focal lymphovascular space involvement. One of 15 lymph nodes were positive for metastatic disease. Postoperatively patient is doing well. The tumor was ER +100% PR +100% HER-2/neu negative with a proliferation marker 53%.  #2 It has been enrolled on clinical trial NSABP B-49 utilizing Taxotere Cytoxan x 6 cycles versus an anthracycline. Patient was randomized to Taxotere Cytoxan every 3 weeks for a total of 6 cycles. We have discussed this with the patient. She met with our clinical research nurse Dondra Spry lot. Risks and benefits of treatment have been discussed.  #3 patient is Here for cycle #5 of Taxotere and Cytoxan she tolerating it well. She does develop a little bit of peripheral paresthesias. Otherwise no other complaints  #4 swelling of lower and upper extremities secondary to fluid retention.    PLAN:   #1 Patient will proceed with  cycle #5 of chemotherapy consisting of Taxotere and Cytoxan with day 2 Neulasta. Overall she is tolerating it well. #2 patient does have right upper extremity lymphedema she is going to lymphedema clinic.   #3 patient will take her diuretics for her generalized swelling and 10 pound weight gain.  #4 patient will return in one week's time for followup.   All questions were answered. The patient knows to call the clinic with any problems, questions or concerns. We can certainly see the patient much sooner if necessary.  I spent 30 minutes counseling the patient face to face. The total time spent in the appointment was 30 minutes.    Drue Second, MD Medical/Oncology [facility] [phone] (beeper) [phone] (Office)  12/15/2011, 11:49 AM

## 2011-12-15 NOTE — Patient Instructions (Addendum)
1. Proceed with chemotherapy today.  2. Take your diuretic as prescribed  3. i will see you back in 1 week

## 2011-12-15 NOTE — Progress Notes (Signed)
12/15/11 @ 13:00, NSABP B-49, Cycle 5:  [name] into the [facility] for labs, to see Dr. Welton Flakes and to receive Taxotere and Cytoxan per the B-49 protocol.  She is doing well and her labs were all appropriate for treatment.  She reports fleeting tingling in her fingertips, but there is no numbness and she is having no problems with fine motor skills (sensory neuropathy, grade 1).  Spoke with Rexene Edison, RN to let her know that all treatment is per institutional standards.

## 2011-12-15 NOTE — Patient Instructions (Addendum)
[facility] Discharge Instructions for Patients Receiving Chemotherapy  Today you received the following chemotherapy agents Taxotere and Carboplatin.  To help prevent nausea and vomiting after your treatment, we encourage you to take your nausea medication. Begin taking it as often as prescribed for by Dr. Welton Flakes.    If you develop nausea and vomiting that is not controlled by your nausea medication, call the clinic. If it is after clinic hours your family physician or the after hours number for the clinic or go to the Emergency Department.   BELOW ARE SYMPTOMS THAT SHOULD BE REPORTED IMMEDIATELY:  *FEVER GREATER THAN 100.5 F  *CHILLS WITH OR WITHOUT FEVER  NAUSEA AND VOMITING THAT IS NOT CONTROLLED WITH YOUR NAUSEA MEDICATION  *UNUSUAL SHORTNESS OF BREATH  *UNUSUAL BRUISING OR BLEEDING  TENDERNESS IN MOUTH AND THROAT WITH OR WITHOUT PRESENCE OF ULCERS  *URINARY PROBLEMS  *BOWEL PROBLEMS  UNUSUAL RASH Items with * indicate a potential emergency and should be followed up as soon as possible.  One of the nurses will contact you 24 hours after your treatment. Please let the nurse know about any problems that you may have experienced. Feel free to call the clinic you have any questions or concerns. The clinic phone number is [phone].   I have been informed and understand all the instructions given to me. I know to contact the clinic, my physician, or go to the Emergency Department if any problems should occur. I do not have any questions at this time, but understand that I may call the clinic during office hours   should I have any questions or need assistance in obtaining follow up care.    __________________________________________  _____________  __________ Signature of Patient or Authorized Representative            Date                   Time    __________________________________________ Nurse's Signature

## 2011-12-16 ENCOUNTER — Ambulatory Visit (HOSPITAL_BASED_OUTPATIENT_CLINIC_OR_DEPARTMENT_OTHER): Payer: Medicare Other

## 2011-12-16 VITALS — BP 128/74 | HR 90 | Temp 96.9°F

## 2011-12-16 DIAGNOSIS — C50319 Malignant neoplasm of lower-inner quadrant of unspecified female breast: Secondary | ICD-10-CM

## 2011-12-16 MED ORDER — PEGFILGRASTIM INJECTION 6 MG/0.6ML
6.0000 mg | Freq: Once | SUBCUTANEOUS | Status: AC
  Administered 2011-12-16: 6 mg via SUBCUTANEOUS

## 2011-12-17 ENCOUNTER — Ambulatory Visit: Payer: Medicare Other | Admitting: Physical Therapy

## 2011-12-19 ENCOUNTER — Ambulatory Visit: Payer: Medicare Other | Admitting: Physical Therapy

## 2011-12-22 ENCOUNTER — Ambulatory Visit (HOSPITAL_BASED_OUTPATIENT_CLINIC_OR_DEPARTMENT_OTHER): Payer: Medicare Other | Admitting: Oncology

## 2011-12-22 ENCOUNTER — Encounter: Payer: Self-pay | Admitting: Oncology

## 2011-12-22 ENCOUNTER — Telehealth: Payer: Self-pay | Admitting: *Deleted

## 2011-12-22 ENCOUNTER — Other Ambulatory Visit (HOSPITAL_BASED_OUTPATIENT_CLINIC_OR_DEPARTMENT_OTHER): Payer: Medicare Other | Admitting: Lab

## 2011-12-22 VITALS — BP 115/71 | HR 99 | Temp 98.3°F | Ht 62.0 in | Wt 301.8 lb

## 2011-12-22 DIAGNOSIS — C773 Secondary and unspecified malignant neoplasm of axilla and upper limb lymph nodes: Secondary | ICD-10-CM

## 2011-12-22 DIAGNOSIS — C50319 Malignant neoplasm of lower-inner quadrant of unspecified female breast: Secondary | ICD-10-CM

## 2011-12-22 DIAGNOSIS — Z170 Estrogen receptor positive status [ER+]: Secondary | ICD-10-CM

## 2011-12-22 DIAGNOSIS — D649 Anemia, unspecified: Secondary | ICD-10-CM

## 2011-12-22 LAB — CBC WITH DIFFERENTIAL/PLATELET
Basophils Absolute: 0 10*3/uL (ref 0.0–0.1)
EOS%: 1.6 % (ref 0.0–7.0)
Eosinophils Absolute: 0.1 10*3/uL (ref 0.0–0.5)
HCT: 27.4 % — ABNORMAL LOW (ref 34.8–46.6)
HGB: 9.1 g/dL — ABNORMAL LOW (ref 11.6–15.9)
MONO#: 1.2 10*3/uL — ABNORMAL HIGH (ref 0.1–0.9)
NEUT#: 1.5 10*3/uL (ref 1.5–6.5)
NEUT%: 37.6 % — ABNORMAL LOW (ref 38.4–76.8)
RDW: 19.9 % — ABNORMAL HIGH (ref 11.2–14.5)
lymph#: 1.3 10*3/uL (ref 0.9–3.3)

## 2011-12-22 LAB — COMPREHENSIVE METABOLIC PANEL
AST: 12 U/L (ref 0–37)
Albumin: 3.8 g/dL (ref 3.5–5.2)
BUN: 16 mg/dL (ref 6–23)
CO2: 28 mEq/L (ref 19–32)
Calcium: 9.3 mg/dL (ref 8.4–10.5)
Chloride: 103 mEq/L (ref 96–112)
Creatinine, Ser: 1.02 mg/dL (ref 0.50–1.10)
Glucose, Bld: 103 mg/dL — ABNORMAL HIGH (ref 70–99)
Potassium: 3.6 mEq/L (ref 3.5–5.3)

## 2011-12-22 NOTE — Patient Instructions (Addendum)
Increse lasix to 40 mg daily  Blood transfusion on 12/25/11

## 2011-12-22 NOTE — Telephone Encounter (Signed)
Gave patient her schedule

## 2011-12-22 NOTE — Progress Notes (Signed)
OFFICE PROGRESS NOTE  CC  [name],Kathryn A, MD [address] [place] [address] Dr. Chipper Herb Dr. 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 Invasive ductal carcinoma (T3 NX MX).   #2 Patient has gone on to have right simple mastectomy with the final pathology revealing invasive and in situ ductal carcinoma. 2 nodules were seen 3 cm and 2.2 cm margins were negative there was focal lymphovascular space involvement by tumor. 1/15 sentinel node was positive for metastatic disease.  #3 the tumor was estrogen receptor +100% progesterone receptor +100% and HER-2/neu negative proliferation marker 53%. Final pathologic staging was pT2 N1 [name]. MX.  # 4. Taxotere/Cytoxan q 3 weeks beginning on 09/22/11 on NSBP B-49  #5. Symptomatic anemia   CURRENT THERAPY: Here for  cycle #5 of Taxotere Cytoxan with day 2 Neulasta.  INTERVAL HISTORY: WILHELMENIA ADDIS 61 y.o. female returns for Followup visit.  Overall patient is doing well she is short of breath on exertion her hemoglobin is 9.5. She also feels like she has gained quite [name] bit of weight especially 10 pounds over the last 1 week do to just fluid retention. She does have diuretics but she has not taken these for quite some time. Therefore she is going to start taking these again. She otherwise denies any nausea vomiting fevers chills night sweats headaches no chest pains or palpitations no myalgias or arthralgias. She is experiencing shortness of breath on exertion. Remainder of the 10 point review of systems is negative. MEDICAL HISTORY: Past Medical History  Diagnosis Date  . Hypertension   . Asthma   . Hyperlipemia   . Heart murmur   . Arthritis   . OSA (obstructive sleep apnea)     CPAP  settings- ?   . Blood transfusion     hx of last one in 1987  . 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 MRSA (methicillin resistant staph aureus) culture positive     08/2011  . Breast cancer     ALLERGIES:  is allergic to trandolapril-verapamil hcl er.  MEDICATIONS:  Current Outpatient Prescriptions  Medication Sig Dispense Refill  . albuterol (PROAIR HFA) 108 (90 BASE) MCG/ACT inhaler Inhale 2 puffs into the lungs every 6 (six) hours as needed. For shortness of breath      . cabergoline (DOSTINEX) 0.5 MG tablet Take 0.25 mg by mouth every Monday, Wednesday, and Friday.       . cyanocobalamin 500 MCG tablet Take 500 mcg by mouth daily.      Marland Kitchen dexamethasone (DECADRON) 4 MG tablet Take 2 tablets two times [name] the day before Taxotere. Then take 2 tabs two times [name] starting the day after chemo for 3 days.  30 tablet  1  . Fluticasone-Salmeterol (ADVAIR DISKUS) 100-50 MCG/DOSE AEPB Inhale 1 puff into the lungs daily after breakfast.       . furosemide (LASIX) 20 MG tablet Take 20 mg by mouth daily. Pt takes in am      . HYDROcodone-acetaminophen (VICODIN) 5-500 MG per tablet Take 1 tablet by mouth every 6 (six) hours as needed.      . latanoprost (XALATAN) 0.005 % ophthalmic solution Place 1 drop into both eyes at  bedtime.       . lidocaine-prilocaine (EMLA) cream Apply topically as needed. Apply to port 45 minutes prior to chemotherapy  30 g  4  . loratadine (CLARITIN) 10 MG tablet Take 10 mg by mouth daily. Take 1 tablet with Chemo      . LORazepam (ATIVAN) 0.5 MG tablet Take 1 tablet (0.5 mg total) by mouth every 6 (six) hours as needed (Nausea or vomiting).  30 tablet  0  . Multiple Vitamin (MULITIVITAMIN WITH MINERALS) TABS Take 1 tablet by mouth daily with breakfast.       . Multiple Vitamins-Minerals ([employer] DAILY MULTIPLE FOR WOMEN) TABS Take 1 tablet by mouth daily.      . ondansetron (ZOFRAN) 8 MG tablet Take 1 tablet two times [name]  starting the day after chemo for 3 days. Then take 1 tab two times [name] as needed for nausea or vomiting.  30 tablet  3  . prochlorperazine (COMPAZINE) 10 MG tablet Take 1 tablet (10 mg total) by mouth every 6 (six) hours as needed (Nausea or vomiting).  30 tablet  1  . prochlorperazine (COMPAZINE) 25 MG suppository Place 1 suppository (25 mg total) rectally every 12 (twelve) hours as needed for nausea.  12 suppository  3  . simvastatin (ZOCOR) 20 MG tablet Take 20 mg by mouth daily with breakfast.       . valsartan-hydrochlorothiazide (DIOVAN-HCT) 160-25 MG per tablet Take 1 tablet by mouth daily with breakfast.         SURGICAL HISTORY:  Past Surgical History  Procedure Date  . Tubal ligation   . Dilation and curettage of uterus     x3  . Carpal tunnel release   . Hand surgery     2012  . Cholecystectomy 1987    open - Dr Wiliam Ke  . Mastectomy modified radical 08/14/11    right , ER/PR +, HER2 -  . Abdominal hysterectomy 2007    TAH/BSO  . Breast surgery     mastectomy RIGHT  . Port-[name]-cath insertion 09/11/11  . Portacath placement 09/11/2011    Procedure: INSERTION PORT-[name]-CATH;  Surgeon: Clovis Pu. Cornett, MD;  Location: [facility];  Service: General;  Laterality: N/[name];  Insert of Port    REVIEW OF SYSTEMS:  Pertinent items are noted in HPI.   PHYSICAL EXAMINATION: General appearance: alert, cooperative and appears stated age Neck: no adenopathy, no carotid bruit, no JVD, supple, symmetrical, trachea midline and thyroid not enlarged, symmetric, no tenderness/mass/nodules Lymph nodes: Cervical, supraclavicular, and axillary nodes normal. Resp: clear to auscultation bilaterally and normal percussion bilaterally Back: symmetric, no curvature. ROM normal. No CVA tenderness. Cardio: regular rate and rhythm, S1, S2 normal, no murmur, click, rub or gallop GI: soft, non-tender; bowel sounds normal; no masses,  no organomegaly Extremities: extremities normal, atraumatic, no cyanosis or  edema Neurologic: Grossly normal Right mastectomy scar is healing. Left breast no masses or nipple discharge.  ECOG PERFORMANCE STATUS: 1 - Symptomatic but completely ambulatory  Blood pressure 115/71, pulse 99, temperature 98.3 F (36.8 C), temperature source Oral, height 5\' 2"  (1.575 m), weight 301 lb 12.8 oz (136.896 kg).  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 The final pathology revealed multifocal disease the largest tumor was 3 cm with [name] second tumor measuring 2.2 cm with focal lymphovascular space involvement. One of 15 lymph nodes were positive for metastatic disease. Postoperatively patient is doing well. The tumor was ER +100% PR +100% HER-2/neu negative with [name] proliferation marker 53%.  #2 It has been enrolled on clinical trial NSABP B-49 utilizing Taxotere Cytoxan x 6 cycles versus an anthracycline. Patient was randomized to Taxotere Cytoxan every 3 weeks for [name] total of 6 cycles. We have discussed this with the patient. She met with our clinical research nurse Dondra Spry lot. Risks and benefits of treatment have been discussed.  #3 patient is s/p cycle #5 of Taxotere and Cytoxan she tolerating it well. She does develop [name] little bit of peripheral paresthesias. Otherwise no other complaints  #4 swelling of lower and upper  extremities secondary to fluid retention.  #5. Symptomatic anemia    PLAN:   #1 Patient is s/p cycle #5 of chemotherapy consisting of Taxotere and Cytoxan with day 2 Neulasta. Overall she is tolerating it well.  #2 patient does have right upper extremity lymphedema she is going to lymphedema clinic.   #3 patient will increase Dose of her Lasix to 40 mg daily for her generalized swelling and  weight gain likely due to taxotere  #4. Transfuse 2 units of PRBC on Thursday  #5 patient will return in 2 week's time for followup.   All questions were answered. The patient knows to call the clinic with any problems, questions or concerns. We can certainly see the patient much sooner if necessary.  I spent >25 minutes counseling the patient face to face. The total time spent in the appointment was 30 minutes.    Drue Second, MD Medical/Oncology [facility] [phone] (beeper) [phone] (Office)  12/22/2011, 11:08 AM

## 2011-12-25 ENCOUNTER — Ambulatory Visit: Payer: Medicare Other | Admitting: Lab

## 2011-12-25 ENCOUNTER — Ambulatory Visit: Payer: Medicare Other

## 2011-12-25 ENCOUNTER — Encounter (HOSPITAL_COMMUNITY)
Admission: RE | Admit: 2011-12-25 | Discharge: 2011-12-25 | Disposition: A | Discharge status: Home / Self Care | Payer: Medicare Other | Source: Ambulatory Visit | Attending: Oncology | Admitting: Oncology

## 2011-12-25 VITALS — BP 112/76 | HR 84 | Temp 98.9°F | Resp 18

## 2011-12-25 DIAGNOSIS — D649 Anemia, unspecified: Secondary | ICD-10-CM

## 2011-12-25 DIAGNOSIS — C50319 Malignant neoplasm of lower-inner quadrant of unspecified female breast: Secondary | ICD-10-CM

## 2011-12-25 LAB — PREPARE RBC (CROSSMATCH)

## 2011-12-25 MED ORDER — ACETAMINOPHEN 325 MG PO TABS
650.0000 mg | ORAL_TABLET | Freq: Once | ORAL | Status: AC
  Administered 2011-12-25: 650 mg via ORAL

## 2011-12-25 MED ORDER — DIPHENHYDRAMINE HCL 25 MG PO CAPS
25.0000 mg | ORAL_CAPSULE | Freq: Once | ORAL | Status: AC
  Administered 2011-12-25: 25 mg via ORAL

## 2011-12-25 MED ORDER — HEPARIN SOD (PORK) LOCK FLUSH 100 UNIT/ML IV SOLN
250.0000 [IU] | INTRAVENOUS | Status: AC | PRN
  Administered 2011-12-25: 500 [IU]
  Filled 2011-12-25: qty 5

## 2011-12-25 MED ORDER — FUROSEMIDE 10 MG/ML IJ SOLN
20.0000 mg | Freq: Once | INTRAMUSCULAR | Status: AC
  Administered 2011-12-25: 20 mg via INTRAVENOUS

## 2011-12-25 MED ORDER — SODIUM CHLORIDE 0.9 % IJ SOLN
3.0000 mL | INTRAMUSCULAR | Status: AC | PRN
  Administered 2011-12-25: 10 mL
  Filled 2011-12-25: qty 10

## 2011-12-25 MED ORDER — SODIUM CHLORIDE 0.9 % IV SOLN
250.0000 mL | Freq: Once | INTRAVENOUS | Status: AC
  Administered 2011-12-25: 250 mL via INTRAVENOUS

## 2011-12-25 NOTE — Patient Instructions (Addendum)
Blood Transfusion   A blood transfusion replaces your blood or some of its parts. Blood is replaced when you have lost blood because of surgery, an accident, or for severe blood conditions like anemia.  You can donate blood to be used on yourself if you have a planned surgery. If you lose blood during that surgery, your own blood can be given back to you.  Any blood given to you is checked to make sure it matches your blood type. Your temperature, blood pressure, and heart rate (vital signs) will be checked often.   GET HELP RIGHT AWAY IF:    You feel sick to your stomach (nauseous) or throw up (vomit).   You have watery poop (diarrhea).   You have shortness of breath or trouble breathing.   You have blood in your pee (urine) or have dark colored pee.   You have chest pain or tightness.   Your eyes or skin turn yellow (jaundice).   You have a temperature by mouth above 102 F (38.9 C), not controlled by medicine.   You start to shake and have chills.   You develop a a red rash (hives) or feel itchy.   You develop lightheadedness or feel confused.   You develop back, joint, or muscle pain.   You do not feel hungry (lost appetite).   You feel tired, restless, or nervous.   You develop belly (abdominal) cramps.  Document Released: 08/08/2008 Document Revised: 05/01/2011 Document Reviewed: 08/08/2008  [employer] Patient Information 2012 [employer].

## 2011-12-26 LAB — TYPE AND SCREEN
ABO/RH(D): O POS
Antibody Screen: NEGATIVE
Unit division: 0

## 2012-01-05 ENCOUNTER — Encounter: Payer: Self-pay | Admitting: *Deleted

## 2012-01-05 ENCOUNTER — Other Ambulatory Visit (HOSPITAL_BASED_OUTPATIENT_CLINIC_OR_DEPARTMENT_OTHER): Payer: Medicare Other | Admitting: Lab

## 2012-01-05 ENCOUNTER — Ambulatory Visit (HOSPITAL_BASED_OUTPATIENT_CLINIC_OR_DEPARTMENT_OTHER): Payer: Medicare Other

## 2012-01-05 ENCOUNTER — Ambulatory Visit (HOSPITAL_BASED_OUTPATIENT_CLINIC_OR_DEPARTMENT_OTHER): Payer: Medicare Other | Admitting: Oncology

## 2012-01-05 ENCOUNTER — Encounter: Payer: Self-pay | Admitting: Oncology

## 2012-01-05 VITALS — BP 132/74 | HR 101 | Temp 97.8°F | Resp 20 | Ht 62.0 in | Wt 305.9 lb

## 2012-01-05 DIAGNOSIS — C773 Secondary and unspecified malignant neoplasm of axilla and upper limb lymph nodes: Secondary | ICD-10-CM

## 2012-01-05 DIAGNOSIS — Z5111 Encounter for antineoplastic chemotherapy: Secondary | ICD-10-CM

## 2012-01-05 DIAGNOSIS — Z9010 Acquired absence of unspecified breast and nipple: Secondary | ICD-10-CM

## 2012-01-05 DIAGNOSIS — C50319 Malignant neoplasm of lower-inner quadrant of unspecified female breast: Secondary | ICD-10-CM

## 2012-01-05 DIAGNOSIS — Z170 Estrogen receptor positive status [ER+]: Secondary | ICD-10-CM

## 2012-01-05 LAB — CBC WITH DIFFERENTIAL/PLATELET
BASO%: 0.2 % (ref 0.0–2.0)
EOS%: 0 % (ref 0.0–7.0)
Eosinophils Absolute: 0 10*3/uL (ref 0.0–0.5)
LYMPH%: 10.6 % — ABNORMAL LOW (ref 14.0–49.7)
MCH: 30 pg (ref 25.1–34.0)
MCV: 90.9 fL (ref 79.5–101.0)
MONO%: 4.6 % (ref 0.0–14.0)
NEUT#: 8.5 10*3/uL — ABNORMAL HIGH (ref 1.5–6.5)
Platelets: 295 10*3/uL (ref 145–400)
RBC: 3.53 10*6/uL — ABNORMAL LOW (ref 3.70–5.45)

## 2012-01-05 LAB — COMPREHENSIVE METABOLIC PANEL
ALT: 13 U/L (ref 0–35)
Albumin: 3.5 g/dL (ref 3.5–5.2)
CO2: 25 mEq/L (ref 19–32)
Calcium: 9.8 mg/dL (ref 8.4–10.5)
Chloride: 98 mEq/L (ref 96–112)
Creatinine, Ser: 0.89 mg/dL (ref 0.50–1.10)
Potassium: 3.8 mEq/L (ref 3.5–5.3)

## 2012-01-05 MED ORDER — SODIUM CHLORIDE 0.9 % IV SOLN
Freq: Once | INTRAVENOUS | Status: AC
  Administered 2012-01-05: 12:00:00 via INTRAVENOUS

## 2012-01-05 MED ORDER — ONDANSETRON 16 MG/50ML IVPB (CHCC)
16.0000 mg | Freq: Once | INTRAVENOUS | Status: AC
  Administered 2012-01-05: 16 mg via INTRAVENOUS

## 2012-01-05 MED ORDER — SODIUM CHLORIDE 0.9 % IJ SOLN
10.0000 mL | INTRAMUSCULAR | Status: DC | PRN
  Administered 2012-01-05: 10 mL
  Filled 2012-01-05: qty 10

## 2012-01-05 MED ORDER — DEXTROSE 5 % IV SOLN
75.0000 mg/m2 | Freq: Once | INTRAVENOUS | Status: AC
  Administered 2012-01-05: 180 mg via INTRAVENOUS
  Filled 2012-01-05: qty 18

## 2012-01-05 MED ORDER — HEPARIN SOD (PORK) LOCK FLUSH 100 UNIT/ML IV SOLN
500.0000 [IU] | Freq: Once | INTRAVENOUS | Status: AC | PRN
  Administered 2012-01-05: 500 [IU]
  Filled 2012-01-05: qty 5

## 2012-01-05 MED ORDER — DEXAMETHASONE SODIUM PHOSPHATE 4 MG/ML IJ SOLN
20.0000 mg | Freq: Once | INTRAMUSCULAR | Status: AC
  Administered 2012-01-05: 20 mg via INTRAVENOUS

## 2012-01-05 MED ORDER — CYCLOPHOSPHAMIDE CHEMO INJECTION 1 GM
600.0000 mg/m2 | Freq: Once | INTRAMUSCULAR | Status: AC
  Administered 2012-01-05: 1440 mg via INTRAVENOUS
  Filled 2012-01-05: qty 72

## 2012-01-05 NOTE — Patient Instructions (Addendum)
Proceed with chemotherapy today   Return in 1 week for follow up 

## 2012-01-05 NOTE — Progress Notes (Signed)
OFFICE PROGRESS NOTE  CC  [name],[name], MD [address] [place] [address] Dr. Chipper Herb Dr. 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 Invasive ductal carcinoma (T3 NX MX).   #2 Patient has gone on to have right simple mastectomy with the final pathology revealing invasive and in situ ductal carcinoma. 2 nodules were seen 3 cm and 2.2 cm margins were negative there was focal lymphovascular space involvement by tumor. 1/15 sentinel node was positive for metastatic disease.  #3 the tumor was estrogen receptor +100% progesterone receptor +100% and HER-2/neu negative proliferation marker 53%. Final pathologic staging was pT2 N1 A. MX.  # 4. Taxotere/Cytoxan q 3 weeks beginning on 09/22/11 on NSBP B-49  CURRENT THERAPY: Here for  cycle #6 of Taxotere Cytoxan with day 2 Neulasta.  INTERVAL HISTORY: [name] 61 y.o. female returns for Followup visit. She is here now for her final cycle of chemotherapy for her breast cancer. Overall she's tolerated it well although she did require some blood transfusions due to anemia that was symptomatic with shortness of breath on exertion. Today she feels well she has no nausea vomiting fevers chills night sweats headaches shortness of breath. She does have a cough but she is also known asthmatic. When she takes her Advair her cough does improves considerably.Remainder of the 10 point review of systems is negative.  MEDICAL HISTORY: Past Medical History  Diagnosis Date  . Hypertension   . Asthma   . Hyperlipemia   . Heart murmur   . Arthritis   . OSA (obstructive sleep apnea)     CPAP settings- ?   . Blood transfusion     hx of last one in 1987  . Cancer of lower-inner quadrant of  female breast 07/24/2011    right breast cancer   . MRSA (methicillin resistant staph aureus) culture positive     08/2011  . Breast cancer     ALLERGIES:  is allergic to trandolapril-verapamil hcl er.  MEDICATIONS:  Current Outpatient Prescriptions  Medication Sig Dispense Refill  . albuterol (PROAIR HFA) 108 (90 BASE) MCG/ACT inhaler Inhale 2 puffs into the lungs every 6 (six) hours as needed. For shortness of breath      . cabergoline (DOSTINEX) 0.5 MG tablet Take 0.25 mg by mouth every Monday, Wednesday, and Friday.       . cyanocobalamin 500 MCG tablet Take 500 mcg by mouth daily.      Marland Kitchen dexamethasone (DECADRON) 4 MG tablet Take 2 tablets two times a day the day before Taxotere. Then take 2 tabs two times a day starting the day after chemo for 3 days.  30 tablet  1  . Fluticasone-Salmeterol (ADVAIR DISKUS) 100-50 MCG/DOSE AEPB Inhale 1 puff into the lungs daily after breakfast.       . furosemide (LASIX) 20 MG tablet Take 20 mg by mouth daily. Pt takes in am      . HYDROcodone-acetaminophen (VICODIN) 5-500 MG per tablet Take 1 tablet by mouth every 6 (six) hours as needed.      . latanoprost (XALATAN) 0.005 % ophthalmic solution Place 1 drop into both eyes at bedtime.       . lidocaine-prilocaine (EMLA) cream Apply topically as needed. Apply to port 45 minutes prior to  chemotherapy  30 g  4  . loratadine (CLARITIN) 10 MG tablet Take 10 mg by mouth daily. Take 1 tablet with Chemo      . LORazepam (ATIVAN) 0.5 MG tablet Take 1 tablet (0.5 mg total) by mouth every 6 (six) hours as needed (Nausea or vomiting).  30 tablet  0  . Multiple Vitamin (MULITIVITAMIN WITH MINERALS) TABS Take 1 tablet by mouth daily with breakfast.       . Multiple Vitamins-Minerals ([employer] DAILY MULTIPLE FOR WOMEN) TABS Take 1 tablet by mouth daily.      . ondansetron (ZOFRAN) 8 MG tablet Take 1 tablet two times a day starting the day after chemo for 3 days. Then take 1 tab two times a day as needed for nausea or  vomiting.  30 tablet  3  . prochlorperazine (COMPAZINE) 10 MG tablet Take 1 tablet (10 mg total) by mouth every 6 (six) hours as needed (Nausea or vomiting).  30 tablet  1  . prochlorperazine (COMPAZINE) 25 MG suppository Place 1 suppository (25 mg total) rectally every 12 (twelve) hours as needed for nausea.  12 suppository  3  . simvastatin (ZOCOR) 20 MG tablet Take 20 mg by mouth daily with breakfast.       . valsartan-hydrochlorothiazide (DIOVAN-HCT) 160-25 MG per tablet Take 1 tablet by mouth daily with breakfast.         SURGICAL HISTORY:  Past Surgical History  Procedure Date  . Tubal ligation   . Dilation and curettage of uterus     x3  . Carpal tunnel release   . Hand surgery     2012  . Cholecystectomy 1987    open - Dr Wiliam Ke  . Mastectomy modified radical 08/14/11    right , ER/PR +, HER2 -  . Abdominal hysterectomy 2007    TAH/BSO  . Breast surgery     mastectomy RIGHT  . Port-a-cath insertion 09/11/11  . Portacath placement 09/11/2011    Procedure: INSERTION PORT-A-CATH;  Surgeon: Clovis Pu. Cornett, MD;  Location: [facility];  Service: General;  Laterality: N/A;  Insert of Port    REVIEW OF SYSTEMS:  Pertinent items are noted in HPI.   PHYSICAL EXAMINATION: General appearance: alert, cooperative and appears stated age Neck: no adenopathy, no carotid bruit, no JVD, supple, symmetrical, trachea midline and thyroid not enlarged, symmetric, no tenderness/mass/nodules Lymph nodes: Cervical, supraclavicular, and axillary nodes normal. Resp: clear to auscultation bilaterally and normal percussion bilaterally Back: symmetric, no curvature. ROM normal. No CVA tenderness. Cardio: regular rate and rhythm, S1, S2 normal, no murmur, click, rub or gallop GI: soft, non-tender; bowel sounds normal; no masses,  no organomegaly Extremities: extremities normal, atraumatic, no cyanosis or edema Neurologic: Grossly normal Right mastectomy scar is healing. Left breast no masses or nipple  discharge.  ECOG PERFORMANCE STATUS: 1 - Symptomatic but completely ambulatory  Blood pressure 132/74, pulse 101, temperature 97.8 F (36.6 C), temperature source Oral, resp. rate 20, height 5\' 2"  (1.575 m), weight 305 lb 14.4 oz (138.755 kg).  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 The final pathology revealed multifocal disease the largest tumor was 3 cm with a second tumor measuring 2.2 cm with focal lymphovascular space involvement. One of 15 lymph nodes were positive for metastatic disease. Postoperatively patient is doing well. The tumor was ER +100% PR +100% HER-2/neu negative with a proliferation marker 53%.  #2 It has been enrolled on clinical trial NSABP B-49 utilizing Taxotere Cytoxan x 6 cycles versus an anthracycline. Patient was randomized to Taxotere Cytoxan every 3 weeks for a total of 6 cycles. We have discussed this with the patient. She met with our clinical research nurse Dondra Spry lot. Risks and benefits of treatment have been discussed.  #3 patient is here for cycle 6 of Taxotere and Cytoxan she tolerating it well. She does develop a little bit of peripheral paresthesias. Otherwise no other complaints   PLAN:  #1 proceed with cycle #6 of Taxotere and Cytoxan.  #2 patient will return in one week's time for  interim labs.  All questions were answered. The patient knows to call the clinic with any problems, questions or concerns. We can certainly see the patient much sooner if necessary.  I spent >25 minutes counseling the patient face to face. The total time spent in the appointment was 30 minutes.    Drue Second, MD Medical/Oncology [facility] [phone] (beeper) [phone] (Office)  01/05/2012, 11:00 AM

## 2012-01-05 NOTE — Patient Instructions (Addendum)
[facility] Discharge Instructions for Patients Receiving Chemotherapy  Today you received the following chemotherapy agents Taxotere, Cytoxan  To help prevent nausea and vomiting after your treatment, we encourage you to take your nausea medication as directed by MD. If you develop nausea and vomiting that is not controlled by your nausea medication, call the clinic. If it is after clinic hours your family physician or the after hours number for the clinic or go to the Emergency Department.   BELOW ARE SYMPTOMS THAT SHOULD BE REPORTED IMMEDIATELY:  *FEVER GREATER THAN 100.5 F  *CHILLS WITH OR WITHOUT FEVER  NAUSEA AND VOMITING THAT IS NOT CONTROLLED WITH YOUR NAUSEA MEDICATION  *UNUSUAL SHORTNESS OF BREATH  *UNUSUAL BRUISING OR BLEEDING  TENDERNESS IN MOUTH AND THROAT WITH OR WITHOUT PRESENCE OF ULCERS  *URINARY PROBLEMS  *BOWEL PROBLEMS  UNUSUAL RASH Items with * indicate a potential emergency and should be followed up as soon as possible.  Feel free to call the clinic you have any questions or concerns. The clinic phone number is [phone].

## 2012-01-05 NOTE — Progress Notes (Signed)
01/05/12 @ 1130, NSABP B49, Cycle 6:  [name] into the [facility] for her last treatment with Taxotere and Carboplatin.  Her labs were all appropriate to proceed with treatment.  She did not verbalize any new complaints or concerns.    Spoke with Faith Rogue, RN in the infusion area and provided her with a sign for the pump with the statement that all treatment is given per institutional standards.

## 2012-01-06 ENCOUNTER — Ambulatory Visit (HOSPITAL_BASED_OUTPATIENT_CLINIC_OR_DEPARTMENT_OTHER): Payer: Medicare Other

## 2012-01-06 VITALS — BP 148/76 | HR 100 | Temp 96.9°F

## 2012-01-06 DIAGNOSIS — C50319 Malignant neoplasm of lower-inner quadrant of unspecified female breast: Secondary | ICD-10-CM

## 2012-01-06 MED ORDER — PEGFILGRASTIM INJECTION 6 MG/0.6ML
6.0000 mg | Freq: Once | SUBCUTANEOUS | Status: AC
  Administered 2012-01-06: 6 mg via SUBCUTANEOUS
  Filled 2012-01-06: qty 0.6

## 2012-01-12 ENCOUNTER — Encounter: Payer: Self-pay | Admitting: Oncology

## 2012-01-12 ENCOUNTER — Other Ambulatory Visit: Payer: Medicare Other | Admitting: Lab

## 2012-01-12 ENCOUNTER — Ambulatory Visit (HOSPITAL_BASED_OUTPATIENT_CLINIC_OR_DEPARTMENT_OTHER): Payer: Medicare Other | Admitting: Oncology

## 2012-01-12 ENCOUNTER — Telehealth: Payer: Self-pay | Admitting: Oncology

## 2012-01-12 ENCOUNTER — Ambulatory Visit (HOSPITAL_BASED_OUTPATIENT_CLINIC_OR_DEPARTMENT_OTHER): Payer: Medicare Other | Admitting: Lab

## 2012-01-12 VITALS — BP 120/64 | HR 112 | Temp 98.5°F | Resp 22 | Ht 62.0 in | Wt 303.6 lb

## 2012-01-12 DIAGNOSIS — C773 Secondary and unspecified malignant neoplasm of axilla and upper limb lymph nodes: Secondary | ICD-10-CM

## 2012-01-12 DIAGNOSIS — C50319 Malignant neoplasm of lower-inner quadrant of unspecified female breast: Secondary | ICD-10-CM

## 2012-01-12 DIAGNOSIS — M7989 Other specified soft tissue disorders: Secondary | ICD-10-CM

## 2012-01-12 DIAGNOSIS — Z170 Estrogen receptor positive status [ER+]: Secondary | ICD-10-CM

## 2012-01-12 LAB — COMPREHENSIVE METABOLIC PANEL
ALT: 12 U/L (ref 0–35)
AST: 13 U/L (ref 0–37)
Alkaline Phosphatase: 66 U/L (ref 39–117)
CO2: 28 mEq/L (ref 19–32)
Creatinine, Ser: 0.87 mg/dL (ref 0.50–1.10)
Sodium: 139 mEq/L (ref 135–145)
Total Bilirubin: 1.3 mg/dL — ABNORMAL HIGH (ref 0.3–1.2)
Total Protein: 6.1 g/dL (ref 6.0–8.3)

## 2012-01-12 LAB — CBC WITH DIFFERENTIAL/PLATELET
BASO%: 0.9 % (ref 0.0–2.0)
HCT: 29.8 % — ABNORMAL LOW (ref 34.8–46.6)
LYMPH%: 26.7 % (ref 14.0–49.7)
MCH: 30 pg (ref 25.1–34.0)
MCHC: 32.5 g/dL (ref 31.5–36.0)
MCV: 92.3 fL (ref 79.5–101.0)
MONO%: 25.9 % — ABNORMAL HIGH (ref 0.0–14.0)
NEUT%: 45.2 % (ref 38.4–76.8)
Platelets: 160 10*3/uL (ref 145–400)
RBC: 3.23 10*6/uL — ABNORMAL LOW (ref 3.70–5.45)

## 2012-01-12 NOTE — Telephone Encounter (Signed)
gve the pt her oct 2013 appt calendar along with the rad onc appt with dr Dayton Scrape in sept

## 2012-01-12 NOTE — Patient Instructions (Addendum)
1. Refer to Dr. Dr. Dayton Scrape for radiation  2. Return in October for follow up and port flush

## 2012-01-12 NOTE — Progress Notes (Signed)
OFFICE PROGRESS NOTE  CC  [name],[name], MD [address] [place] [address] Dr. Chipper Herb Dr. 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 Invasive ductal carcinoma (T3 NX MX).   #2 Patient has gone on to have right simple mastectomy with the final pathology revealing invasive and in situ ductal carcinoma. 2 nodules were seen 3 cm and 2.2 cm margins were negative there was focal lymphovascular space involvement by tumor. 1/15 sentinel node was positive for metastatic disease.  #3 the tumor was estrogen receptor +100% progesterone receptor +100% and HER-2/neu negative proliferation marker 53%. Final pathologic staging was pT2 N1 A. MX.  # 4. Taxotere/Cytoxan q 3 weeks beginning on 09/22/11 on NSBP B-49 Patient has completed total of 6 cycles of Taxotere Cytoxan From 09/22/2011 -  01/05/2012.  CURRENT THERAPY: Status post cycle #6 of Taxotere Cytoxan with day 2 Neulasta.  INTERVAL HISTORY: JUDYE LORINO 61 y.o. female returns for Followup visit. She is here now for her final cycle of chemotherapy for her breast cancer. Overall she's tolerated it well although she did require some blood transfusions due to anemia that was symptomatic with shortness of breath on exertion. Today she feels well she has no nausea vomiting fevers chills night sweats headaches shortness of breath. She does have a cough but she is also known asthmatic. When she takes her Advair her cough does improves considerably.Remainder of the 10 point review of systems is negative.  MEDICAL HISTORY: Past Medical History  Diagnosis Date  . Hypertension   . Asthma   . Hyperlipemia   . Heart murmur   . Arthritis   . OSA (obstructive sleep apnea)     CPAP settings- ?    . Blood transfusion     hx of last one in 1987  . 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 MRSA (methicillin resistant staph aureus) culture positive     08/2011  . Breast cancer     ALLERGIES:  is allergic to trandolapril-verapamil hcl er.  MEDICATIONS:  Current Outpatient Prescriptions  Medication Sig Dispense Refill  . albuterol (PROAIR HFA) 108 (90 BASE) MCG/ACT inhaler Inhale 2 puffs into the lungs every 6 (six) hours as needed. For shortness of breath      . cabergoline (DOSTINEX) 0.5 MG tablet Take 0.25 mg by mouth every Monday, Wednesday, and Friday.       . cyanocobalamin 500 MCG tablet Take 500 mcg by mouth daily.      Marland Kitchen dexamethasone (DECADRON) 4 MG tablet Take 2 tablets two times a day the day before Taxotere. Then take 2 tabs two times a day starting the day after chemo for 3 days.  30 tablet  1  . Fluticasone-Salmeterol (ADVAIR DISKUS) 100-50 MCG/DOSE AEPB Inhale 1 puff into the lungs daily after breakfast.       . furosemide (LASIX) 20 MG tablet Take 20 mg by mouth daily. Pt takes in am      . HYDROcodone-acetaminophen (VICODIN) 5-500 MG per tablet Take 1 tablet by mouth every 6 (six) hours as needed.      . latanoprost (XALATAN) 0.005 % ophthalmic solution Place 1 drop into both eyes at bedtime.       Marland Kitchen  lidocaine-prilocaine (EMLA) cream Apply topically as needed. Apply to port 45 minutes prior to chemotherapy  30 g  4  . loratadine (CLARITIN) 10 MG tablet Take 10 mg by mouth daily. Take 1 tablet with Chemo      . LORazepam (ATIVAN) 0.5 MG tablet Take 1 tablet (0.5 mg total) by mouth every 6 (six) hours as needed (Nausea or vomiting).  30 tablet  0  . Multiple Vitamin (MULITIVITAMIN WITH MINERALS) TABS Take 1 tablet by mouth daily with breakfast.       . Multiple Vitamins-Minerals ([facility] DAILY MULTIPLE FOR WOMEN) TABS Take 1 tablet by mouth daily.      . ondansetron (ZOFRAN) 8 MG tablet Take 1 tablet two times a day starting the day  after chemo for 3 days. Then take 1 tab two times a day as needed for nausea or vomiting.  30 tablet  3  . prochlorperazine (COMPAZINE) 10 MG tablet Take 1 tablet (10 mg total) by mouth every 6 (six) hours as needed (Nausea or vomiting).  30 tablet  1  . prochlorperazine (COMPAZINE) 25 MG suppository Place 1 suppository (25 mg total) rectally every 12 (twelve) hours as needed for nausea.  12 suppository  3  . simvastatin (ZOCOR) 20 MG tablet Take 20 mg by mouth daily with breakfast.       . valsartan-hydrochlorothiazide (DIOVAN-HCT) 160-25 MG per tablet Take 1 tablet by mouth daily with breakfast.         SURGICAL HISTORY:  Past Surgical History  Procedure Date  . Tubal ligation   . Dilation and curettage of uterus     x3  . Carpal tunnel release   . Hand surgery     2012  . Cholecystectomy 1987    open - Dr Wiliam Ke  . Mastectomy modified radical 08/14/11    right , ER/PR +, HER2 -  . Abdominal hysterectomy 2007    TAH/BSO  . Breast surgery     mastectomy RIGHT  . Port-a-cath insertion 09/11/11  . Portacath placement 09/11/2011    Procedure: INSERTION PORT-A-CATH;  Surgeon: Clovis Pu. Cornett, MD;  Location: [facility];  Service: General;  Laterality: N/A;  Insert of Port    REVIEW OF SYSTEMS:  Pertinent items are noted in HPI.   PHYSICAL EXAMINATION: General appearance: alert, cooperative and appears stated age Neck: no adenopathy, no carotid bruit, no JVD, supple, symmetrical, trachea midline and thyroid not enlarged, symmetric, no tenderness/mass/nodules Lymph nodes: Cervical, supraclavicular, and axillary nodes normal. Resp: clear to auscultation bilaterally and normal percussion bilaterally Back: symmetric, no curvature. ROM normal. No CVA tenderness. Cardio: regular rate and rhythm, S1, S2 normal, no murmur, click, rub or gallop GI: soft, non-tender; bowel sounds normal; no masses,  no organomegaly Extremities: extremities normal, atraumatic, no cyanosis or edema Neurologic:  Grossly normal Right mastectomy scar is healing. Left breast no masses or nipple discharge.  ECOG PERFORMANCE STATUS: 1 - Symptomatic but completely ambulatory  Blood pressure 120/64, pulse 112, temperature 98.5 F (36.9 C), temperature source Oral, resp. rate 22, height 5\' 2"  (1.575 m), weight 303 lb 9.6 oz (137.712 kg).  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 The final pathology revealed multifocal disease the largest tumor was 3 cm with a second tumor measuring 2.2 cm with focal lymphovascular space involvement. One of 15 lymph nodes were positive for metastatic disease. Postoperatively patient is doing well. The tumor was ER +100% PR +100% HER-2/neu negative with a proliferation marker 53%.  #2 It has been enrolled on clinical trial NSABP B-49 utilizing Taxotere Cytoxan x 6 cycles versus an anthracycline. Patient was randomized to Taxotere Cytoxan every 3 weeks for a total of 6 cycles. We have discussed this with the patient. She met with our clinical research nurse Dondra Spry lot. Risks and benefits of treatment have been discussed.  #3 Patient is status post 6 cycles of Taxotere and Cytoxan following NSABP B. 49 clinical trial. Overall she tolerated her chemotherapy well without any problems.  #4 patient does have right upper  extremity swelling she is seeing the lymphedema clinic and she is waiting for her lymphedema sleeve that has been ordered.  #5 patient will not be seen by Dr. Chipper Herb for consideration of radiation.  PLAN:  #1 Patient has now completed all of her chemotherapy her interim labs looked terrific.  #2 I will refer her to Dr. Chipper Herb for radiation oncology consultation.  #3 I will plan on seeing the patient back in October 2013. At that time she will also need her port flushed.  All questions were answered. The patient knows to call the clinic with any problems, questions or concerns. We can certainly see the patient much sooner if necessary.  I spent >25 minutes counseling the patient face to face. The total time spent in the appointment was 30 minutes.    Drue Second, MD Medical/Oncology [facility] [phone] (beeper) [phone] (Office)  01/12/2012, 10:48 AM

## 2012-01-14 ENCOUNTER — Other Ambulatory Visit: Payer: Medicare Other | Admitting: Lab

## 2012-01-22 ENCOUNTER — Encounter: Payer: Medicare Other | Admitting: Physical Therapy

## 2012-01-27 ENCOUNTER — Ambulatory Visit: Payer: Medicare Other | Admitting: Oncology

## 2012-01-27 ENCOUNTER — Other Ambulatory Visit: Payer: Medicare Other | Admitting: Lab

## 2012-01-29 ENCOUNTER — Encounter: Payer: Self-pay | Admitting: *Deleted

## 2012-01-29 ENCOUNTER — Ambulatory Visit (HOSPITAL_COMMUNITY)
Admission: RE | Admit: 2012-01-29 | Discharge: 2012-01-29 | Disposition: A | Discharge status: Home / Self Care | Payer: Medicare Other | Source: Ambulatory Visit | Attending: Oncology | Admitting: Oncology

## 2012-01-29 ENCOUNTER — Ambulatory Visit (HOSPITAL_BASED_OUTPATIENT_CLINIC_OR_DEPARTMENT_OTHER): Payer: Medicare Other | Admitting: Oncology

## 2012-01-29 ENCOUNTER — Other Ambulatory Visit (HOSPITAL_BASED_OUTPATIENT_CLINIC_OR_DEPARTMENT_OTHER): Payer: Medicare Other | Admitting: Lab

## 2012-01-29 VITALS — BP 145/88 | HR 103 | Temp 98.2°F | Resp 20 | Ht 62.0 in | Wt 315.4 lb

## 2012-01-29 DIAGNOSIS — C50319 Malignant neoplasm of lower-inner quadrant of unspecified female breast: Secondary | ICD-10-CM

## 2012-01-29 DIAGNOSIS — R0602 Shortness of breath: Secondary | ICD-10-CM

## 2012-01-29 DIAGNOSIS — Z853 Personal history of malignant neoplasm of breast: Secondary | ICD-10-CM

## 2012-01-29 DIAGNOSIS — R0600 Dyspnea, unspecified: Secondary | ICD-10-CM

## 2012-01-29 DIAGNOSIS — M7989 Other specified soft tissue disorders: Secondary | ICD-10-CM

## 2012-01-29 DIAGNOSIS — C773 Secondary and unspecified malignant neoplasm of axilla and upper limb lymph nodes: Secondary | ICD-10-CM

## 2012-01-29 LAB — CBC WITH DIFFERENTIAL/PLATELET
BASO%: 1.1 % (ref 0.0–2.0)
Basophils Absolute: 0.1 10*3/uL (ref 0.0–0.1)
EOS%: 0.5 % (ref 0.0–7.0)
Eosinophils Absolute: 0 10*3/uL (ref 0.0–0.5)
HCT: 28.6 % — ABNORMAL LOW (ref 34.8–46.6)
LYMPH%: 22.5 % (ref 14.0–49.7)
MCH: 31.3 pg (ref 25.1–34.0)
MCHC: 33.7 g/dL (ref 31.5–36.0)
MONO#: 0.9 10*3/uL (ref 0.1–0.9)
NEUT%: 61.5 % (ref 38.4–76.8)
RBC: 3.09 10*6/uL — ABNORMAL LOW (ref 3.70–5.45)
WBC: 6.3 10*3/uL (ref 3.9–10.3)
lymph#: 1.4 10*3/uL (ref 0.9–3.3)

## 2012-01-29 LAB — COMPREHENSIVE METABOLIC PANEL (CC13)
ALT: 13 U/L (ref 0–55)
AST: 15 U/L (ref 5–34)
CO2: 26 mEq/L (ref 22–29)
Chloride: 104 mEq/L (ref 98–107)
Creatinine: 0.9 mg/dL (ref 0.6–1.1)
Sodium: 142 mEq/L (ref 136–145)
Total Bilirubin: 1.5 mg/dL — ABNORMAL HIGH (ref 0.20–1.20)
Total Protein: 5.5 g/dL — ABNORMAL LOW (ref 6.4–8.3)

## 2012-01-29 NOTE — Patient Instructions (Addendum)
May take lasix 20 mg today  Chest x-ray today  Echocardiogram as soon as possible

## 2012-01-30 ENCOUNTER — Encounter (INDEPENDENT_AMBULATORY_CARE_PROVIDER_SITE_OTHER): Payer: Self-pay | Admitting: Surgery

## 2012-01-30 ENCOUNTER — Telehealth: Payer: Self-pay | Admitting: Oncology

## 2012-01-30 ENCOUNTER — Ambulatory Visit (INDEPENDENT_AMBULATORY_CARE_PROVIDER_SITE_OTHER): Payer: Medicare Other | Admitting: Surgery

## 2012-01-30 VITALS — BP 136/76 | HR 84 | Temp 96.5°F | Resp 18 | Ht 62.0 in | Wt 313.2 lb

## 2012-01-30 DIAGNOSIS — C50319 Malignant neoplasm of lower-inner quadrant of unspecified female breast: Secondary | ICD-10-CM

## 2012-01-30 NOTE — Progress Notes (Signed)
NAME: [name]       DOB: 1951-01-29           DATE: 01/30/2012       MRN: [id number]   [name] is a 61 y.o.Marland Kitchenfemale who presents for routine followup of her Right breast cancer, IDC, Stage IIB, receptor +, Her2 neg diagnosed in 2013 and treated with Mastectomy and now finished chemo, getting ready to start radiation. She has no problems or concerns on either side.She has right arm lymphedema, but this is stable with a sleeve  PFSH: She has had no significant changes since the last visit here.  ROS: There have been no significant changes since the last visit here  EXAM:  VS: BP 136/76  Pulse 84  Temp 96.5 F (35.8 C) (Temporal)  Resp 18  Ht 5\' 2"  (1.575 m)  Wt 313 lb 3.2 oz (142.067 kg)  BMI 57.29 kg/m2  SpO2 98%   General: The patient is alert, oriented, generally healthy appearing, NAD. Mood and affect are normal.  Breasts:  The right breast is surgically absent. No issues; left is wnl Port-A-Cath appears to be intact with no issues.  Lymphatics: She has no axillary or supraclavicular adenopathy on either side.  Extremities: Full ROM of the surgical side with  Moderate lymphedema noted.   Impression: Doing well, with no evidence of recurrent cancer or new cancer  Plan: She'll come back to see me in a couple of months, after completing radiation.

## 2012-01-30 NOTE — Telephone Encounter (Signed)
gve the pt her sept 2013 appt calendar °

## 2012-01-30 NOTE — Patient Instructions (Signed)
See me again in three months, to evaluate after radiation

## 2012-02-02 ENCOUNTER — Encounter: Payer: Self-pay | Admitting: Radiation Oncology

## 2012-02-03 ENCOUNTER — Ambulatory Visit
Admission: RE | Admit: 2012-02-03 | Discharge: 2012-02-03 | Disposition: A | Discharge status: Home / Self Care | Payer: Medicare Other | Source: Ambulatory Visit | Attending: Radiation Oncology | Admitting: Radiation Oncology

## 2012-02-03 ENCOUNTER — Encounter: Payer: Self-pay | Admitting: *Deleted

## 2012-02-03 ENCOUNTER — Encounter: Payer: Self-pay | Admitting: Radiation Oncology

## 2012-02-03 DIAGNOSIS — C50319 Malignant neoplasm of lower-inner quadrant of unspecified female breast: Secondary | ICD-10-CM

## 2012-02-03 DIAGNOSIS — G709 Myoneural disorder, unspecified: Secondary | ICD-10-CM | POA: Insufficient documentation

## 2012-02-03 HISTORY — DX: Myoneural disorder, unspecified: G70.9

## 2012-02-03 NOTE — Progress Notes (Signed)
FUNC Right Breast Cancer,Invasive Ductal/DCIS Stage IIB(T2,N1,MO) ER/PR=positive, Her 2=neg. S/P Masectectomy S/P Taxotere/Cytoxan completed 6 cycles from 09/22/11-01/05/12 Patient alert,oriented x3, slow steady gait, sob with exertion, wearing lymphedema sleeve right arm, edema hands and  pedal edema  Married, fatigued, 100% room air sats     Allergies: Trandolapril,verapamil,Hcl er

## 2012-02-03 NOTE — Progress Notes (Signed)
Please see the Nurse Progress Note in the MD Initial Consult Encounter for this patient. 

## 2012-02-03 NOTE — Progress Notes (Signed)
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 I last saw the patient for a followup visit on 09/16/2011. To review, showed a right mastectomy and sentinel lymph node biopsy on 08/14/2011. Her sentinel lymph node biopsy was positive and she underwent a completion axillary node dissection with the remaining 14 lymph nodes free of metastatic disease for a total of one of 15 lymph nodes containing metastatic carcinoma She had 2 tumors, one measuring 2.0 cm and the other 2.2 cm. There was focal LV I. Margins were widely negative. The sentinel lymph node did have extracapsular extension. Her primary tumor was ER/PR positive at 100% with an elevated Ki-67 53%. Her postoperative course was complicated by lymphedema for which she has seen physical therapy. She wears elastic sleeve. She completed her adjuvant 6 cycles of Taxotere/Cytoxan chemotherapy on 01/05/2012. She is without complaints today.  Physical examination: Alert and oriented 61 year old white female appearing her stated age.  Wt Readings from Last 3 Encounters:  01/30/12 313 lb 3.2 oz (142.067 kg)  01/29/12 315 lb 6.4 oz (143.065 kg)  01/12/12 303 lb 9.6 oz (137.712 kg)   Temp Readings from Last 3 Encounters:  01/30/12 96.5 F (35.8 C) Temporal  01/29/12 98.2 F (36.8 C) Oral  01/12/12 98.5 F (36.9 C) Oral   BP Readings from Last 3 Encounters:  01/30/12 136/76  01/29/12 145/88  01/12/12 120/64   Pulse Readings from Last 3 Encounters:  01/30/12 84  01/29/12 103  01/12/12 112   Head and neck examination: She wears a scarf. Nodes: Without palpable cervical, supraclavicular, or axillary lymphadenopathy. Chest: Right-sided mastectomy without visible or palpable evidence for  recurrent disease. Lungs are clear. Left breast without masses or lesions. Back without spinal or CVA tenderness. Heart: Regular in rhythm. Abdomen obese without hepatomegaly. Extremities: 2+ right upper extremity lymphedema, , she wears an elastic sleeve. Neurologic examination: Grossly nonfocal.  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 Her risk factors for local recurrence involve the presence of LV I., no positivity and elevated proliferation index (Ki-67 53%). [place] and [place] trials have shown improvement in local regional control and overall survival in this setting. The major criticism of these 2 prospective randomized trials is that the axilla surgery was felt to be suboptimal, and that radiation therapy probably made up for inadequate axillary surgery. I explained to the patient and her husband that her risk for local regional recurrence, as mentioned before, is probably in the vicinity of 10-15%. When one looks at the potential toxicity of radiation therapy, primarily lymphedema, one has to reconsider the real benefits of radiation therapy in terms of quality of life.. I told the patient that the risk for significant worsening of her lymphedema may outweigh the benefit of local regional radiation therapy. After a lengthy discussion she really wants to do everything that is possible to improve her local regional control and cure rate, and thus we are going to proceed with radiation therapy. She'll continue with her physical therapy to minimize her risk  for worsening lymphedema. We discussed the potential acute and late toxicities of radiation therapy, and consent is signed today. She'll return early next week for simulation/treatment planning.  Plan: As discussed above. Following  radiation therapy she will be placed on adjuvant hormone therapy through Dr. Welton Flakes.   30 minutes was spent face-to-face with the patient, primarily counseling the patient and coordinating her care.

## 2012-02-04 ENCOUNTER — Ambulatory Visit: Payer: Medicare Other | Attending: Oncology | Admitting: Physical Therapy

## 2012-02-04 DIAGNOSIS — IMO0001 Reserved for inherently not codable concepts without codable children: Secondary | ICD-10-CM | POA: Insufficient documentation

## 2012-02-04 DIAGNOSIS — I890 Lymphedema, not elsewhere classified: Secondary | ICD-10-CM | POA: Insufficient documentation

## 2012-02-06 NOTE — Progress Notes (Signed)
OFFICE PROGRESS NOTE  CC  [name],[name], MD [address] [place] [address] Dr. Chipper Herb Dr. 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 Invasive ductal carcinoma (T3 NX MX).   #2 Patient has gone on to have right simple mastectomy with the final pathology revealing invasive and in situ ductal carcinoma. 2 nodules were seen 3 cm and 2.2 cm margins were negative there was focal lymphovascular space involvement by tumor. 1/15 sentinel node was positive for metastatic disease.  #3 the tumor was estrogen receptor +100% progesterone receptor +100% and HER-2/neu negative proliferation marker 53%. Final pathologic staging was pT2 N1 A. MX.  # 4. Taxotere/Cytoxan q 3 weeks beginning on 09/22/11 on NSBP B-49 Patient has completed total of 6 cycles of Taxotere Cytoxan From 09/22/2011 -  01/05/2012.  CURRENT THERAPY: Status post cycle #6 of Taxotere Cytoxan with day 2 Neulasta.  INTERVAL HISTORY: KAMIRA MELLETTE 61 y.o. female returns for Followup visit.In followup today after completing all of her chemotherapy. She is fatigued tired she has significant shortness of breath it is worsening since she completed her chemotherapy. She otherwise denies any nausea or vomiting. She has noticed significant swelling of her lower extremities. She is wearing a lymphedema sleeve now. She has not had any fevers chills or night sweats. Patient has begun radiation therapy. Remainder of the review of systems is unremarkable MEDICAL HISTORY: Past Medical History  Diagnosis Date  . Hypertension   . Asthma   . Hyperlipemia   . Heart murmur   . Arthritis   . OSA (obstructive sleep apnea)     CPAP settings- ?   . Blood transfusion     hx of last one in 1987   . Cancer of lower-inner quadrant of female breast 07/24/2011    right breast cancer /IDC,stage IIB,er/pr=+,her2=Neg  . MRSA (methicillin resistant staph aureus) culture positive     08/2011  . Breast cancer   . Lymphedema     right arm wears a sleeve  . History of chemotherapy     taxotere/cytoxan 6 cycles 09/22/11-01/05/12 day 2 neulasta  . Neuromuscular disorder     peripheral neuropathy hands feet    ALLERGIES:  is allergic to trandolapril-verapamil hcl er.  MEDICATIONS:  Current Outpatient Prescriptions  Medication Sig Dispense Refill  . albuterol (PROAIR HFA) 108 (90 BASE) MCG/ACT inhaler Inhale 2 puffs into the lungs every 6 (six) hours as needed. For shortness of breath      . cabergoline (DOSTINEX) 0.5 MG tablet Take 0.25 mg by mouth every Monday, Wednesday, and Friday.       . cyanocobalamin 500 MCG tablet Take 500 mcg by mouth daily.      Marland Kitchen dexamethasone (DECADRON) 4 MG tablet Take 2 tablets two times a day the day before Taxotere. Then take 2 tabs two times a day starting the day after chemo for 3 days.  30 tablet  1  . Fluticasone-Salmeterol (ADVAIR DISKUS) 100-50 MCG/DOSE AEPB Inhale 1 puff into the lungs daily after breakfast.       . furosemide (LASIX) 20 MG tablet Take 20 mg by mouth daily. Pt takes in am      . HYDROcodone-acetaminophen (VICODIN) 5-500 MG per tablet Take 1 tablet by mouth every 6 (six) hours as needed.      Marland Kitchen  latanoprost (XALATAN) 0.005 % ophthalmic solution Place 1 drop into both eyes at bedtime.       . lidocaine-prilocaine (EMLA) cream Apply topically as needed. Apply to port 45 minutes prior to chemotherapy  30 g  4  . loratadine (CLARITIN) 10 MG tablet Take 10 mg by mouth daily. Take 1 tablet with Chemo      . Multiple Vitamin (MULITIVITAMIN WITH MINERALS) TABS Take 1 tablet by mouth daily with breakfast.       . Multiple Vitamins-Minerals ([employer] DAILY MULTIPLE FOR WOMEN) TABS Take 1 tablet by mouth daily.      . simvastatin (ZOCOR) 20 MG tablet Take  20 mg by mouth daily with breakfast.       . valsartan-hydrochlorothiazide (DIOVAN-HCT) 160-25 MG per tablet Take 1 tablet by mouth daily with breakfast.         SURGICAL HISTORY:  Past Surgical History  Procedure Date  . Tubal ligation   . Dilation and curettage of uterus     x3  . Carpal tunnel release   . Hand surgery     2012  . Cholecystectomy 1987    open - Dr Wiliam Ke  . Mastectomy modified radical 08/14/11    right , ER/PR +, HER2 -  . Abdominal hysterectomy 2007    TAH/BSO  . Breast surgery     mastectomy RIGHT  . Port-a-cath insertion 09/11/11  . Portacath placement 09/11/2011    Procedure: INSERTION PORT-A-CATH;  Surgeon: Clovis Pu. Cornett, MD;  Location: [facility];  Service: General;  Laterality: N/A;  Insert of Port    REVIEW OF SYSTEMS:  Pertinent items are noted in HPI.   PHYSICAL EXAMINATION: General appearance: alert, cooperative and appears stated age Neck: no adenopathy, no carotid bruit, no JVD, supple, symmetrical, trachea midline and thyroid not enlarged, symmetric, no tenderness/mass/nodules Lymph nodes: Cervical, supraclavicular, and axillary nodes normal. Resp: clear to auscultation bilaterally and normal percussion bilaterally Back: symmetric, no curvature. ROM normal. No CVA tenderness. Cardio: regular rate and rhythm, S1, S2 normal, no murmur, click, rub or gallop GI: soft, non-tender; bowel sounds normal; no masses,  no organomegaly Extremities: extremities normal, atraumatic, no cyanosis or edema Neurologic: Grossly normal Right mastectomy scar is healing. Left breast no masses or nipple discharge.  ECOG PERFORMANCE STATUS: 1 - Symptomatic but completely ambulatory  Blood pressure 145/88, pulse 103, temperature 98.2 F (36.8 C), temperature source Oral, resp. rate 20, height 5\' 2"  (1.575 m), weight 315 lb 6.4 oz (143.065 kg).  LABORATORY DATA: Lab Results  Component Value Date   WBC 6.3 01/29/2012   HGB 9.7* 01/29/2012   HCT 28.6* 01/29/2012   MCV 92.7  01/29/2012   PLT 180 01/29/2012      Chemistry      Component Value Date/Time   NA 142 01/29/2012 1513   NA 139 01/12/2012 1005   K 3.5 01/29/2012 1513   K 3.6 01/12/2012 1005   CL 104 01/29/2012 1513   CL 99 01/12/2012 1005   CO2 26 01/29/2012 1513   CO2 28 01/12/2012 1005   BUN 12.0 01/29/2012 1513   BUN 12 01/12/2012 1005   CREATININE 0.9 01/29/2012 1513   CREATININE 0.87 01/12/2012 1005      Component Value Date/Time   CALCIUM 9.3 01/12/2012 1005   ALKPHOS 48 01/29/2012 1513   ALKPHOS 66 01/12/2012 1005   AST 15 01/29/2012 1513   AST 13 01/12/2012 1005   ALT 13 01/29/2012 1513   ALT 12 01/12/2012 1005  BILITOT 1.50* 01/29/2012 1513   BILITOT 1.3* 01/12/2012 1005       ASSESSMENT: 61 year old female with  #1 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 The final pathology revealed multifocal disease the largest tumor was 3 cm with a second tumor measuring 2.2 cm with focal lymphovascular space involvement. One of 15 lymph nodes were positive for metastatic disease. Postoperatively patient is doing well. The tumor was ER +100% PR +100% HER-2/neu negative with a proliferation marker 53%.  #2 It has been enrolled on clinical trial NSABP B-49 utilizing Taxotere Cytoxan x 6 cycles versus an anthracycline. Patient was randomized to Taxotere Cytoxan every 3 weeks for a total of 6 cycles. We have discussed this with the patient. She met with our clinical research nurse Dondra Spry lot. Risks and benefits of treatment have been discussed.  #3 Patient is status post 6 cycles of Taxotere and Cytoxan following NSABP B. 49 clinical trial. Overall she tolerated her chemotherapy well without any problems.  #4 patient does have right upper extremity swelling she is seeing the lymphedema clinic and she is waiting for her lymphedema sleeve that has been ordered.  5 shortness of breath. Unclear as to what the cause maybe. We will obtain an echo and  a chest x-ray. If she does have some signs of heart failure I will refer her to cardiology.    PLAN:  #1We will obtain a chest x-ray today for patient's ongoing shortness of breath and she may increase her Lasix to 20 mg today taking an extra dose.  #2 from oncology perspective patient has no evidence of recurrent disease.  All questions were answered. The patient knows to call the clinic with any problems, questions or concerns. We can certainly see the patient much sooner if necessary.  I spent >25 minutes counseling the patient face to face. The total time spent in the appointment was 30 minutes.    Drue Second, MD Medical/Oncology [facility] [phone] (beeper) [phone] (Office)  02/06/2012, 9:25 AM

## 2012-02-10 ENCOUNTER — Ambulatory Visit
Admission: RE | Admit: 2012-02-10 | Discharge: 2012-02-10 | Disposition: A | Discharge status: Home / Self Care | Payer: Medicare Other | Source: Ambulatory Visit | Attending: Radiation Oncology | Admitting: Radiation Oncology

## 2012-02-10 DIAGNOSIS — C50319 Malignant neoplasm of lower-inner quadrant of unspecified female breast: Secondary | ICD-10-CM

## 2012-02-10 NOTE — Progress Notes (Signed)
Simulation/treatment planning note:  The patient was taken to the CT simulator. She was placed only board with a custom neck mold. Her right chest wall field borders were marked with a radiopaque wire. Her right partial mastectomy scar was marked with a radiopaque wire as well. She was then scanned. She was set up to medial and lateral right chest wall tangents. 2 separate multileaf collimators were designed. She was then set up to LAO to the right supraclavicular/axillary region and the second multileaf collimator was designed. Lastly, she was set up to a PA axillary field and a separate multileaf, was designed for a total of 5 complex treatment devices. I prescribing 4680 cGy and 26 sessions to her right chest wall and regional lymph nodes with construction of 1.0 cm custom bolus on the first day of her treatment to be applied every other day. I prescribing 4680 cGy in 26 sessions to the right supraclavicular/axillary region a 3 cm step and supplementing the right axilla with a PA field to bring the mid axillary dose up to 4500 cGy in 26 sessions. Isodose plans and dosimetry are requested. This be followed by electron beam boost to her right mastectomy scar for further 1000 cGy in 5 sessions.

## 2012-02-16 ENCOUNTER — Telehealth: Payer: Self-pay | Admitting: *Deleted

## 2012-02-16 NOTE — Telephone Encounter (Signed)
Made patient appointment for echo at echo at [place] on [address] for 02-27-2012 arrival time 9:15am

## 2012-02-17 ENCOUNTER — Other Ambulatory Visit: Payer: Self-pay | Admitting: Medical Oncology

## 2012-02-17 ENCOUNTER — Telehealth: Payer: Self-pay | Admitting: Medical Oncology

## 2012-02-17 ENCOUNTER — Ambulatory Visit
Admission: RE | Admit: 2012-02-17 | Discharge: 2012-02-17 | Disposition: A | Discharge status: Home / Self Care | Payer: Medicare Other | Source: Ambulatory Visit | Attending: Radiation Oncology | Admitting: Radiation Oncology

## 2012-02-17 ENCOUNTER — Encounter: Payer: Self-pay | Admitting: Radiation Oncology

## 2012-02-17 DIAGNOSIS — R011 Cardiac murmur, unspecified: Secondary | ICD-10-CM

## 2012-02-17 NOTE — Progress Notes (Signed)
Simulation verification note: The patient was similar to verification for treatment to her right chest wall. Her isocenter needs to be moved anteriorly by 0.5 cm. Otherwise the multileaf collimators contoured the treatment volume appropriately.

## 2012-02-17 NOTE — Telephone Encounter (Signed)
Received call from [facility] regarding patient appointments.  "Patient just called here because she stated that someone called her regarding an appointment on Oct 4 but we havent made any of those appointments."  Per notes in computer patient scheduled for ECHO stress test on Oct 4 per orders from Dr. Welton Flakes.  "We are trying to schedule her for a 2D ECHO that Dr. Welton Flakes had also ordered."  Spoke to Dr. Welton Flakes regarding which test patient needs, per MD, patient to have 2D ECHO, stress test not needed.  New order placed for 2D ECHO with contrast.  Notified [facility] of new order and received confirmation of appointment on 9/27 @ 0930.  Patient aware of this appointment  Contacted patient regarding appointment on 10/4, advised patient that this appointment was canceled and reminded her of appointment on 9/27.  Patient confirmed date/time of appointment on 9/27.  No further questions at this time.  Instructed patient to call with any questions or concerns.

## 2012-02-18 ENCOUNTER — Encounter: Payer: Self-pay | Admitting: Medical Oncology

## 2012-02-18 ENCOUNTER — Ambulatory Visit
Admission: RE | Admit: 2012-02-18 | Discharge: 2012-02-18 | Disposition: A | Discharge status: Home / Self Care | Payer: Medicare Other | Source: Ambulatory Visit | Attending: Radiation Oncology | Admitting: Radiation Oncology

## 2012-02-18 DIAGNOSIS — C50319 Malignant neoplasm of lower-inner quadrant of unspecified female breast: Secondary | ICD-10-CM

## 2012-02-18 MED ORDER — RADIAPLEXRX EX GEL
Freq: Once | CUTANEOUS | Status: AC
  Administered 2012-02-18: 12:00:00 via TOPICAL

## 2012-02-18 MED ORDER — ALRA NON-METALLIC DEODORANT (RAD-ONC)
1.0000 "application " | Freq: Once | TOPICAL | Status: AC
  Administered 2012-02-18: 1 via TOPICAL

## 2012-02-18 NOTE — Progress Notes (Signed)
Post sim teaching,radiation therapy book, alra,radiaplex gel, skin products flyer sheet, schedule, my business card, with instructions on use of products,when to apply rdiaplex gel,alra  after radt tx and bedtime,, sees MD after tx on Mondays and prn Teach back given by patient

## 2012-02-19 ENCOUNTER — Ambulatory Visit (HOSPITAL_BASED_OUTPATIENT_CLINIC_OR_DEPARTMENT_OTHER): Payer: Medicare Other | Admitting: Oncology

## 2012-02-19 ENCOUNTER — Other Ambulatory Visit (HOSPITAL_BASED_OUTPATIENT_CLINIC_OR_DEPARTMENT_OTHER): Payer: Medicare Other | Admitting: Lab

## 2012-02-19 ENCOUNTER — Ambulatory Visit
Admission: RE | Admit: 2012-02-19 | Discharge: 2012-02-19 | Disposition: A | Discharge status: Home / Self Care | Payer: Medicare Other | Source: Ambulatory Visit | Attending: Radiation Oncology | Admitting: Radiation Oncology

## 2012-02-19 ENCOUNTER — Encounter: Payer: Self-pay | Admitting: Oncology

## 2012-02-19 ENCOUNTER — Telehealth: Payer: Self-pay | Admitting: *Deleted

## 2012-02-19 VITALS — BP 137/81 | HR 92 | Temp 98.2°F | Resp 20 | Ht 62.0 in | Wt 306.3 lb

## 2012-02-19 DIAGNOSIS — C50319 Malignant neoplasm of lower-inner quadrant of unspecified female breast: Secondary | ICD-10-CM

## 2012-02-19 DIAGNOSIS — R0609 Other forms of dyspnea: Secondary | ICD-10-CM

## 2012-02-19 DIAGNOSIS — M7989 Other specified soft tissue disorders: Secondary | ICD-10-CM

## 2012-02-19 DIAGNOSIS — R0600 Dyspnea, unspecified: Secondary | ICD-10-CM

## 2012-02-19 DIAGNOSIS — R0689 Other abnormalities of breathing: Secondary | ICD-10-CM

## 2012-02-19 DIAGNOSIS — R0989 Other specified symptoms and signs involving the circulatory and respiratory systems: Secondary | ICD-10-CM

## 2012-02-19 DIAGNOSIS — C773 Secondary and unspecified malignant neoplasm of axilla and upper limb lymph nodes: Secondary | ICD-10-CM

## 2012-02-19 LAB — CBC WITH DIFFERENTIAL/PLATELET
BASO%: 0.7 % (ref 0.0–2.0)
EOS%: 2.2 % (ref 0.0–7.0)
HCT: 32.8 % — ABNORMAL LOW (ref 34.8–46.6)
LYMPH%: 36.6 % (ref 14.0–49.7)
MCH: 30.4 pg (ref 25.1–34.0)
MCHC: 33 g/dL (ref 31.5–36.0)
MONO%: 11.5 % (ref 0.0–14.0)
NEUT%: 49 % (ref 38.4–76.8)
Platelets: 148 10*3/uL (ref 145–400)
RBC: 3.56 10*6/uL — ABNORMAL LOW (ref 3.70–5.45)

## 2012-02-19 LAB — COMPREHENSIVE METABOLIC PANEL (CC13)
Albumin: 3.8 g/dL (ref 3.5–5.0)
CO2: 27 mEq/L (ref 22–29)
Calcium: 9.9 mg/dL (ref 8.4–10.4)
Chloride: 102 mEq/L (ref 98–107)
Glucose: 106 mg/dl — ABNORMAL HIGH (ref 70–99)
Sodium: 140 mEq/L (ref 136–145)
Total Bilirubin: 1.9 mg/dL — ABNORMAL HIGH (ref 0.20–1.20)
Total Protein: 6.4 g/dL (ref 6.4–8.3)

## 2012-02-19 NOTE — Telephone Encounter (Signed)
Gave patient appointment 04-12-2012 starting at 8:30am

## 2012-02-19 NOTE — Progress Notes (Signed)
OFFICE PROGRESS NOTE  CC  [name],[name], MD [address] [place] [address] Dr. Chipper Herb Dr. 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 Invasive ductal carcinoma (T3 NX MX).   #2 Patient has gone on to have right simple mastectomy with the final pathology revealing invasive and in situ ductal carcinoma. 2 nodules were seen 3 cm and 2.2 cm margins were negative there was focal lymphovascular space involvement by tumor. 1/15 sentinel node was positive for metastatic disease.  #3 the tumor was estrogen receptor +100% progesterone receptor +100% and HER-2/neu negative proliferation marker 53%. Final pathologic staging was pT2 N1 A. MX.  # 4. Taxotere/Cytoxan q 3 weeks beginning on 09/22/11 on NSBP B-49 Patient has completed total of 6 cycles of Taxotere Cytoxan From 09/22/2011 -  01/05/2012.  CURRENT THERAPY: Patient is continuing radiation therapy  INTERVAL HISTORY: [name] 61 y.o. female returns for Followup visit. Clinically she is doing better she has lost a little bit of weight which is most likely due to stopping her steroids. She's tolerating radiation well. Her shortness of breath has significantly improved she did have chest x-ray performed and she also has a echocardiogram scheduled. She is seeing her primary care physician for followup. She denies any achiness fatigue no fevers chills night sweats she still has residual peripheral paresthesias but she tells me that it is improving. Remainder of the 10 point review of systems is negative.  MEDICAL HISTORY: Past Medical History  Diagnosis Date  . Hypertension   . Asthma   . Hyperlipemia   . Heart murmur   . Arthritis   . OSA (obstructive sleep apnea)     CPAP  settings- ?   . Blood transfusion     hx of last one in 1987  . Cancer of lower-inner quadrant of female breast 07/24/2011    right breast cancer /IDC,stage IIB,er/pr=+,her2=Neg  . MRSA (methicillin resistant staph aureus) culture positive     08/2011  . Breast cancer   . Lymphedema     right arm wears a sleeve  . History of chemotherapy     taxotere/cytoxan 6 cycles 09/22/11-01/05/12 day 2 neulasta  . Neuromuscular disorder     peripheral neuropathy hands feet    ALLERGIES:  is allergic to trandolapril-verapamil hcl er.  MEDICATIONS:  Current Outpatient Prescriptions  Medication Sig Dispense Refill  . albuterol (PROAIR HFA) 108 (90 BASE) MCG/ACT inhaler Inhale 2 puffs into the lungs every 6 (six) hours as needed. For shortness of breath      . cabergoline (DOSTINEX) 0.5 MG tablet Take 0.25 mg by mouth every Monday, Wednesday, and Friday.       . cyanocobalamin 500 MCG tablet Take 500 mcg by mouth daily.      Marland Kitchen dexamethasone (DECADRON) 4 MG tablet Take 2 tablets two times a day the day before Taxotere. Then take 2 tabs two times a day starting the day after chemo for 3 days.  30 tablet  1  . Fluticasone-Salmeterol (ADVAIR DISKUS) 100-50 MCG/DOSE AEPB Inhale 1 puff into the lungs daily after breakfast.       . furosemide (LASIX) 20 MG tablet Take 20 mg by mouth daily. Pt takes in am      . hyaluronate sodium (RADIAPLEXRX) GEL Apply  topically once. Apply after rad tx to skin, and bedtime, not before 4 hours prior to rad tx      . HYDROcodone-acetaminophen (VICODIN) 5-500 MG per tablet Take 1 tablet by mouth every 6 (six) hours as needed.      . latanoprost (XALATAN) 0.005 % ophthalmic solution Place 1 drop into both eyes at bedtime.       . lidocaine-prilocaine (EMLA) cream Apply topically as needed. Apply to port 45 minutes prior to chemotherapy  30 g  4  . loratadine (CLARITIN) 10 MG tablet Take 10 mg by mouth daily. Take 1 tablet with Chemo      . Multiple Vitamin (MULITIVITAMIN WITH  MINERALS) TABS Take 1 tablet by mouth daily with breakfast.       . Multiple Vitamins-Minerals ([employer] DAILY MULTIPLE FOR WOMEN) TABS Take 1 tablet by mouth daily.      . non-metallic deodorant Thornton Papas) MISC Apply 1 application topically daily as needed. Apply after rad tx daily and prn but not before 4 hours to rad tx      . simvastatin (ZOCOR) 20 MG tablet Take 20 mg by mouth daily with breakfast.       . valsartan-hydrochlorothiazide (DIOVAN-HCT) 160-25 MG per tablet Take 1 tablet by mouth daily with breakfast.         SURGICAL HISTORY:  Past Surgical History  Procedure Date  . Tubal ligation   . Dilation and curettage of uterus     x3  . Carpal tunnel release   . Hand surgery     2012  . Cholecystectomy 1987    open - Dr Wiliam Ke  . Mastectomy modified radical 08/14/11    right , ER/PR +, HER2 -  . Abdominal hysterectomy 2007    TAH/BSO  . Breast surgery     mastectomy RIGHT  . Port-a-cath insertion 09/11/11  . Portacath placement 09/11/2011    Procedure: INSERTION PORT-A-CATH;  Surgeon: Clovis Pu. Cornett, MD;  Location: [facility];  Service: General;  Laterality: N/A;  Insert of Port    REVIEW OF SYSTEMS:  Pertinent items are noted in HPI.   PHYSICAL EXAMINATION: General appearance: alert, cooperative and appears stated age Neck: no adenopathy, no carotid bruit, no JVD, supple, symmetrical, trachea midline and thyroid not enlarged, symmetric, no tenderness/mass/nodules Lymph nodes: Cervical, supraclavicular, and axillary nodes normal. Resp: clear to auscultation bilaterally and normal percussion bilaterally Back: symmetric, no curvature. ROM normal. No CVA tenderness. Cardio: regular rate and rhythm, S1, S2 normal, no murmur, click, rub or gallop GI: soft, non-tender; bowel sounds normal; no masses,  no organomegaly Extremities: extremities normal, atraumatic, no cyanosis or edema Neurologic: Grossly normal Right mastectomy scar is healing. Left breast no masses or nipple  discharge.  ECOG PERFORMANCE STATUS: 1 - Symptomatic but completely ambulatory  Blood pressure 137/81, pulse 92, temperature 98.2 F (36.8 C), temperature source Oral, resp. rate 20, height 5\' 2"  (1.575 m), weight 306 lb 4.8 oz (138.937 kg).  LABORATORY DATA: Lab Results  Component Value Date   WBC 3.7* 02/19/2012   HGB 10.8* 02/19/2012   HCT 32.8* 02/19/2012   MCV 92.1 02/19/2012   PLT 148 02/19/2012      Chemistry      Component Value Date/Time   NA 142 01/29/2012 1513   NA 139 01/12/2012 1005   K 3.5 01/29/2012 1513   K 3.6 01/12/2012 1005   CL 104 01/29/2012 1513   CL 99 01/12/2012 1005   CO2 26 01/29/2012 1513  CO2 28 01/12/2012 1005   BUN 12.0 01/29/2012 1513   BUN 12 01/12/2012 1005   CREATININE 0.9 01/29/2012 1513   CREATININE 0.87 01/12/2012 1005      Component Value Date/Time   CALCIUM 9.3 01/12/2012 1005   ALKPHOS 48 01/29/2012 1513   ALKPHOS 66 01/12/2012 1005   AST 15 01/29/2012 1513   AST 13 01/12/2012 1005   ALT 13 01/29/2012 1513   ALT 12 01/12/2012 1005   BILITOT 1.50* 01/29/2012 1513   BILITOT 1.3* 01/12/2012 1005       ASSESSMENT: 61 year old female with  #1 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 The final pathology revealed multifocal disease the largest tumor was 3 cm with a second tumor measuring 2.2 cm with focal lymphovascular space involvement. One of 15 lymph nodes were positive for metastatic disease. Postoperatively patient is doing well. The tumor was ER +100% PR +100% HER-2/neu negative with a proliferation marker 53%.  #2 It has been enrolled on clinical trial NSABP B-49 utilizing Taxotere Cytoxan x 6 cycles versus an anthracycline. Patient was randomized to Taxotere Cytoxan every 3 weeks for a total of 6 cycles. We have discussed this with the patient. She met with our clinical research nurse Dondra Spry lot. Risks and benefits of treatment have been discussed.  #3 Patient is status post  6 cycles of Taxotere and Cytoxan following NSABP B. 49 clinical trial. Overall she tolerated her chemotherapy well without any problems.  #4 patient does have right upper extremity swelling she is seeing the lymphedema clinic and she is waiting for her lymphedema sleeve that has been ordered.  5 shortness of breath. This is significantly improved.    PLAN:  #1 overall patient is doing well and she looks terrific. She has lost some weight. Her breathing issues have resolved.  #2 she will continue radiation therapy as scheduled. She will complete radiation in November and then I will see her back to start antiestrogen therapy would most likely an aromatase inhibitor. But we will reevaluate her.  All questions were answered. The patient knows to call the clinic with any problems, questions or concerns. We can certainly see the patient much sooner if necessary.  I spent >15 minutes counseling the patient face to face. The total time spent in the appointment was 30 minutes.    Drue Second, MD Medical/Oncology [facility] [phone] (beeper) [phone] (Office)  02/19/2012, 1:04 PM

## 2012-02-19 NOTE — Patient Instructions (Addendum)
Continue radiation therapy as scheduled  I will see you in November to start anti-estrogen therapy  Call with any problems

## 2012-02-20 ENCOUNTER — Ambulatory Visit
Admission: RE | Admit: 2012-02-20 | Discharge: 2012-02-20 | Disposition: A | Discharge status: Home / Self Care | Payer: Medicare Other | Source: Ambulatory Visit | Attending: Radiation Oncology | Admitting: Radiation Oncology

## 2012-02-20 ENCOUNTER — Ambulatory Visit (HOSPITAL_COMMUNITY): Payer: Medicare Other | Attending: Oncology | Admitting: Radiology

## 2012-02-20 ENCOUNTER — Ambulatory Visit: Payer: Medicare Other | Admitting: Oncology

## 2012-02-20 ENCOUNTER — Other Ambulatory Visit (HOSPITAL_COMMUNITY): Payer: Medicare Other

## 2012-02-20 DIAGNOSIS — G4733 Obstructive sleep apnea (adult) (pediatric): Secondary | ICD-10-CM | POA: Insufficient documentation

## 2012-02-20 DIAGNOSIS — E785 Hyperlipidemia, unspecified: Secondary | ICD-10-CM | POA: Insufficient documentation

## 2012-02-20 DIAGNOSIS — R011 Cardiac murmur, unspecified: Secondary | ICD-10-CM

## 2012-02-20 DIAGNOSIS — I079 Rheumatic tricuspid valve disease, unspecified: Secondary | ICD-10-CM | POA: Insufficient documentation

## 2012-02-20 DIAGNOSIS — R0602 Shortness of breath: Secondary | ICD-10-CM | POA: Insufficient documentation

## 2012-02-20 DIAGNOSIS — I10 Essential (primary) hypertension: Secondary | ICD-10-CM | POA: Insufficient documentation

## 2012-02-20 DIAGNOSIS — I059 Rheumatic mitral valve disease, unspecified: Secondary | ICD-10-CM | POA: Insufficient documentation

## 2012-02-20 NOTE — Progress Notes (Signed)
Echocardiogram performed.  

## 2012-02-23 ENCOUNTER — Ambulatory Visit
Admission: RE | Admit: 2012-02-23 | Discharge: 2012-02-23 | Disposition: A | Discharge status: Home / Self Care | Payer: Medicare Other | Source: Ambulatory Visit | Attending: Radiation Oncology | Admitting: Radiation Oncology

## 2012-02-23 ENCOUNTER — Encounter: Payer: Self-pay | Admitting: Radiation Oncology

## 2012-02-23 VITALS — BP 165/67 | HR 65 | Temp 97.6°F | Resp 20 | Wt 303.8 lb

## 2012-02-23 DIAGNOSIS — C50319 Malignant neoplasm of lower-inner quadrant of unspecified female breast: Secondary | ICD-10-CM

## 2012-02-23 NOTE — Progress Notes (Signed)
Weekly Management Note:  Site:R Chestwall/regional LNs Current Dose:  720  cGy Projected Dose: 5680  cGy  Narrative: The patient is seen today for routine under treatment assessment. CBCT/MVCT images/port films were reviewed. The chart was reviewed.   No complaints today. She has Radioplex gel to use when necessary. She feels that her right upper extremity lymphedema is unchanged. She plans to go back to physical therapy.  Physical Examination:  Filed Vitals:   02/23/12 1211  BP: 165/67  Pulse: 65  Temp: 97.6 F (36.4 C)  Resp: 20  .  Weight: 303 lb 12.8 oz (137.803 kg). No skin changes. Right upper extremity lymphedema is unchanged.  Impression: Tolerating radiation therapy well.  Plan: Continue radiation therapy as planned.

## 2012-02-23 NOTE — Progress Notes (Signed)
Patient here wekly rad txs right axilla/chest wall, slight erythema, skin intact 4/30, using radiaplex gel bid, no c/o pain, wears sleeve for lymphedema right arm,swelling 12:11 PM

## 2012-02-23 NOTE — Addendum Note (Signed)
Encounter addended by: Kamarah Bilotta Mintz Donika Butner, RN on: 02/23/2012  7:59 PM<BR>     Documentation filed: Charges VN

## 2012-02-24 ENCOUNTER — Ambulatory Visit
Admission: RE | Admit: 2012-02-24 | Discharge: 2012-02-24 | Disposition: A | Discharge status: Home / Self Care | Payer: Medicare Other | Source: Ambulatory Visit | Attending: Radiation Oncology | Admitting: Radiation Oncology

## 2012-02-25 ENCOUNTER — Ambulatory Visit
Admission: RE | Admit: 2012-02-25 | Discharge: 2012-02-25 | Disposition: A | Discharge status: Home / Self Care | Payer: Medicare Other | Source: Ambulatory Visit | Attending: Radiation Oncology | Admitting: Radiation Oncology

## 2012-02-26 ENCOUNTER — Ambulatory Visit
Admission: RE | Admit: 2012-02-26 | Discharge: 2012-02-26 | Disposition: A | Discharge status: Home / Self Care | Payer: Medicare Other | Source: Ambulatory Visit | Attending: Radiation Oncology | Admitting: Radiation Oncology

## 2012-02-27 ENCOUNTER — Other Ambulatory Visit (HOSPITAL_COMMUNITY): Payer: Medicare Other

## 2012-02-27 ENCOUNTER — Ambulatory Visit
Admission: RE | Admit: 2012-02-27 | Discharge: 2012-02-27 | Disposition: A | Discharge status: Home / Self Care | Payer: Medicare Other | Source: Ambulatory Visit | Attending: Radiation Oncology | Admitting: Radiation Oncology

## 2012-03-01 ENCOUNTER — Ambulatory Visit
Admission: RE | Admit: 2012-03-01 | Discharge: 2012-03-01 | Disposition: A | Discharge status: Home / Self Care | Payer: Medicare Other | Source: Ambulatory Visit | Attending: Radiation Oncology | Admitting: Radiation Oncology

## 2012-03-01 ENCOUNTER — Encounter: Payer: Self-pay | Admitting: Radiation Oncology

## 2012-03-01 VITALS — BP 131/94 | HR 83 | Temp 97.8°F | Resp 20 | Wt 293.8 lb

## 2012-03-01 DIAGNOSIS — C50319 Malignant neoplasm of lower-inner quadrant of unspecified female breast: Secondary | ICD-10-CM

## 2012-03-01 NOTE — Progress Notes (Signed)
Patient here weekly rad txs right axilla and chest wall, just tanning, on skin, no skin breakdown,uses radiaplex gel bid, wearing lymphedema sleve right arm, doing exercises with right arm, waiting on physical therapy to call her with an appt., eating and drinking well 10:50 AM

## 2012-03-01 NOTE — Progress Notes (Signed)
Weekly Management Note:  Site:R Chestwall/regional LNs Current Dose:  1620  cGy Projected Dose: 5680  cGy  Narrative: The patient is seen today for routine under treatment assessment. CBCT/MVCT images/port films were reviewed. The chart was reviewed.   No complaints today. She uses Radioplex gel. She had corticosteroid injections in both knees for arthritis with Dr. Thomasena Edis.  Physical Examination:  Filed Vitals:   03/01/12 1046  BP: 131/94  Pulse: 83  Temp: 97.8 F (36.6 C)  Resp: 20  .  Weight: 293 lb 12.8 oz (133.267 kg). Faint hyperpigmentation of the skin along her right chest wall with no areas of desquamation.  Impression: Tolerating radiation therapy well.  Plan: Continue radiation therapy as planned.

## 2012-03-02 ENCOUNTER — Ambulatory Visit
Admission: RE | Admit: 2012-03-02 | Discharge: 2012-03-02 | Disposition: A | Discharge status: Home / Self Care | Payer: Medicare Other | Source: Ambulatory Visit | Attending: Radiation Oncology | Admitting: Radiation Oncology

## 2012-03-03 ENCOUNTER — Ambulatory Visit
Admission: RE | Admit: 2012-03-03 | Discharge: 2012-03-03 | Disposition: A | Discharge status: Home / Self Care | Payer: Medicare Other | Source: Ambulatory Visit | Attending: Radiation Oncology | Admitting: Radiation Oncology

## 2012-03-04 ENCOUNTER — Ambulatory Visit
Admission: RE | Admit: 2012-03-04 | Discharge: 2012-03-04 | Disposition: A | Discharge status: Home / Self Care | Payer: Medicare Other | Source: Ambulatory Visit | Attending: Radiation Oncology | Admitting: Radiation Oncology

## 2012-03-05 ENCOUNTER — Ambulatory Visit
Admission: RE | Admit: 2012-03-05 | Discharge: 2012-03-05 | Disposition: A | Discharge status: Home / Self Care | Payer: Medicare Other | Source: Ambulatory Visit | Attending: Radiation Oncology | Admitting: Radiation Oncology

## 2012-03-08 ENCOUNTER — Ambulatory Visit
Admission: RE | Admit: 2012-03-08 | Discharge: 2012-03-08 | Disposition: A | Discharge status: Home / Self Care | Payer: Medicare Other | Source: Ambulatory Visit | Attending: Radiation Oncology | Admitting: Radiation Oncology

## 2012-03-08 ENCOUNTER — Encounter: Payer: Self-pay | Admitting: Radiation Oncology

## 2012-03-08 VITALS — BP 150/78 | HR 79 | Temp 97.9°F | Resp 20 | Wt 287.7 lb

## 2012-03-08 DIAGNOSIS — C50319 Malignant neoplasm of lower-inner quadrant of unspecified female breast: Secondary | ICD-10-CM

## 2012-03-08 NOTE — Progress Notes (Signed)
Weekly Management Note:  Site:R Chestwall/axilla Current Dose:  2520  cGy Projected Dose: 5680  cGy  Narrative: The patient is seen today for routine under treatment assessment. CBCT/MVCT images/port films were reviewed. The chart was reviewed.   She is without complaints today. She uses Radioplex gel.  Physical Examination:  Filed Vitals:   03/08/12 1045  BP: 150/78  Pulse: 79  Temp: 97.9 F (36.6 C)  Resp: 20  .  Weight: 287 lb 11.2 oz (130.5 kg). There is mild hyperpigmentation the skin along her right chest wall and axilla. No areas of desquamation. Right upper extremity lymphedema is unchanged. She continues to wear her a elastic sleeve.  Impression: Tolerating radiation therapy well.  Plan: Continue radiation therapy as planned.

## 2012-03-08 NOTE — Progress Notes (Signed)
Patient here weekly radiation therapy  Right chest wall [name] area patient here weekly rad txs, 14/25 r Wilkerson axilla, r chest wall  With hyperpigmentation, skin intact no c/o pain, uses radiaplex gel bid 10:51 AM

## 2012-03-09 ENCOUNTER — Ambulatory Visit
Admission: RE | Admit: 2012-03-09 | Discharge: 2012-03-09 | Disposition: A | Discharge status: Home / Self Care | Payer: Medicare Other | Source: Ambulatory Visit | Attending: Radiation Oncology | Admitting: Radiation Oncology

## 2012-03-10 ENCOUNTER — Ambulatory Visit
Admission: RE | Admit: 2012-03-10 | Discharge: 2012-03-10 | Disposition: A | Discharge status: Home / Self Care | Payer: Medicare Other | Source: Ambulatory Visit | Attending: Radiation Oncology | Admitting: Radiation Oncology

## 2012-03-10 ENCOUNTER — Other Ambulatory Visit: Payer: Self-pay | Admitting: Neurosurgery

## 2012-03-10 DIAGNOSIS — D497 Neoplasm of unspecified behavior of endocrine glands and other parts of nervous system: Secondary | ICD-10-CM

## 2012-03-11 ENCOUNTER — Ambulatory Visit
Admission: RE | Admit: 2012-03-11 | Discharge: 2012-03-11 | Disposition: A | Discharge status: Home / Self Care | Payer: Medicare Other | Source: Ambulatory Visit | Attending: Radiation Oncology | Admitting: Radiation Oncology

## 2012-03-12 ENCOUNTER — Ambulatory Visit
Admission: RE | Admit: 2012-03-12 | Discharge: 2012-03-12 | Disposition: A | Discharge status: Home / Self Care | Payer: Medicare Other | Source: Ambulatory Visit | Attending: Radiation Oncology | Admitting: Radiation Oncology

## 2012-03-15 ENCOUNTER — Other Ambulatory Visit (HOSPITAL_BASED_OUTPATIENT_CLINIC_OR_DEPARTMENT_OTHER): Payer: Medicare Other | Admitting: Lab

## 2012-03-15 ENCOUNTER — Encounter: Payer: Self-pay | Admitting: *Deleted

## 2012-03-15 ENCOUNTER — Encounter: Payer: Self-pay | Admitting: Adult Health

## 2012-03-15 ENCOUNTER — Ambulatory Visit (HOSPITAL_BASED_OUTPATIENT_CLINIC_OR_DEPARTMENT_OTHER): Payer: Medicare Other

## 2012-03-15 ENCOUNTER — Encounter: Payer: Self-pay | Admitting: Radiation Oncology

## 2012-03-15 ENCOUNTER — Telehealth: Payer: Self-pay | Admitting: *Deleted

## 2012-03-15 ENCOUNTER — Ambulatory Visit
Admission: RE | Admit: 2012-03-15 | Discharge: 2012-03-15 | Disposition: A | Discharge status: Home / Self Care | Payer: Medicare Other | Source: Ambulatory Visit | Attending: Radiation Oncology | Admitting: Radiation Oncology

## 2012-03-15 ENCOUNTER — Ambulatory Visit (HOSPITAL_BASED_OUTPATIENT_CLINIC_OR_DEPARTMENT_OTHER): Payer: Medicare Other | Admitting: Adult Health

## 2012-03-15 VITALS — BP 144/83 | HR 73 | Temp 97.9°F | Resp 20 | Ht 62.0 in | Wt 284.1 lb

## 2012-03-15 VITALS — BP 122/78 | HR 77 | Temp 97.9°F | Resp 20 | Wt 283.8 lb

## 2012-03-15 DIAGNOSIS — Z780 Asymptomatic menopausal state: Secondary | ICD-10-CM

## 2012-03-15 DIAGNOSIS — Z23 Encounter for immunization: Secondary | ICD-10-CM

## 2012-03-15 DIAGNOSIS — I972 Postmastectomy lymphedema syndrome: Secondary | ICD-10-CM

## 2012-03-15 DIAGNOSIS — C50319 Malignant neoplasm of lower-inner quadrant of unspecified female breast: Secondary | ICD-10-CM

## 2012-03-15 DIAGNOSIS — Z170 Estrogen receptor positive status [ER+]: Secondary | ICD-10-CM

## 2012-03-15 DIAGNOSIS — C50919 Malignant neoplasm of unspecified site of unspecified female breast: Secondary | ICD-10-CM

## 2012-03-15 DIAGNOSIS — E559 Vitamin D deficiency, unspecified: Secondary | ICD-10-CM

## 2012-03-15 DIAGNOSIS — C3490 Malignant neoplasm of unspecified part of unspecified bronchus or lung: Secondary | ICD-10-CM

## 2012-03-15 DIAGNOSIS — R0602 Shortness of breath: Secondary | ICD-10-CM

## 2012-03-15 LAB — CBC WITH DIFFERENTIAL/PLATELET
BASO%: 0.5 % (ref 0.0–2.0)
Eosinophils Absolute: 0 10*3/uL (ref 0.0–0.5)
MONO#: 0.3 10*3/uL (ref 0.1–0.9)
NEUT#: 3.4 10*3/uL (ref 1.5–6.5)
Platelets: 145 10*3/uL (ref 145–400)
RBC: 4.13 10*6/uL (ref 3.70–5.45)
RDW: 15.8 % — ABNORMAL HIGH (ref 11.2–14.5)
WBC: 4.6 10*3/uL (ref 3.9–10.3)
lymph#: 0.8 10*3/uL — ABNORMAL LOW (ref 0.9–3.3)

## 2012-03-15 LAB — COMPREHENSIVE METABOLIC PANEL (CC13)
ALT: 9 U/L (ref 0–55)
Albumin: 4 g/dL (ref 3.5–5.0)
CO2: 31 mEq/L — ABNORMAL HIGH (ref 22–29)
Chloride: 99 mEq/L (ref 98–107)
Glucose: 100 mg/dl — ABNORMAL HIGH (ref 70–99)
Potassium: 3.9 mEq/L (ref 3.5–5.1)
Sodium: 139 mEq/L (ref 136–145)
Total Protein: 6.9 g/dL (ref 6.4–8.3)

## 2012-03-15 MED ORDER — INFLUENZA VIRUS VACC SPLIT PF IM SUSP
0.5000 mL | Freq: Once | INTRAMUSCULAR | Status: AC
  Administered 2012-03-15: 0.5 mL via INTRAMUSCULAR
  Filled 2012-03-15: qty 0.5

## 2012-03-15 MED ORDER — HEPARIN SOD (PORK) LOCK FLUSH 100 UNIT/ML IV SOLN
500.0000 [IU] | Freq: Once | INTRAVENOUS | Status: AC
  Administered 2012-03-15: 500 [IU] via INTRAVENOUS
  Filled 2012-03-15: qty 5

## 2012-03-15 MED ORDER — HEPARIN SOD (PORK) LOCK FLUSH 100 UNIT/ML IV SOLN
500.0000 [IU] | Freq: Once | INTRAVENOUS | Status: DC
  Filled 2012-03-15: qty 5

## 2012-03-15 MED ORDER — SODIUM CHLORIDE 0.9 % IJ SOLN
10.0000 mL | INTRAMUSCULAR | Status: DC | PRN
  Filled 2012-03-15: qty 10

## 2012-03-15 MED ORDER — SODIUM CHLORIDE 0.9 % IJ SOLN
10.0000 mL | INTRAMUSCULAR | Status: DC | PRN
  Administered 2012-03-15: 10 mL via INTRAVENOUS
  Filled 2012-03-15: qty 10

## 2012-03-15 NOTE — Progress Notes (Signed)
OFFICE PROGRESS NOTE  CC  [name],[name], MD [address] [place] [address] Dr. Chipper Herb Dr. 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 Invasive ductal carcinoma (T3 NX MX).   #2 Patient has gone on to have right simple mastectomy with the final pathology revealing invasive and in situ ductal carcinoma. 2 nodules were seen 3 cm and 2.2 cm margins were negative there was focal lymphovascular space involvement by tumor. 1/15 sentinel node was positive for metastatic disease.  #3 the tumor was estrogen receptor +100% progesterone receptor +100% and HER-2/neu negative proliferation marker 53%. Final pathologic staging was pT2 N1 A. MX.  # 4. Taxotere/Cytoxan q 3 weeks beginning on 09/22/11 on NSBP B-49 Patient has completed total of 6 cycles of Taxotere Cytoxan From 09/22/2011 -  01/05/2012.  CURRENT THERAPY: Patient is continuing radiation therapy  INTERVAL HISTORY: [name] 61 y.o. female returns for Followup visit. She is feeling well.  She will complete radiation therapy on 03/31/12.  She had an echo last month and is requesting those results.  Otherwise, she is feeling well and w/o complaints.    MEDICAL HISTORY: Past Medical History  Diagnosis Date  . Hypertension   . Asthma   . Hyperlipemia   . Heart murmur   . Arthritis   . OSA (obstructive sleep apnea)     CPAP settings- ?   . Blood transfusion     hx of last one in 1987  . Cancer of lower-inner quadrant of female breast 07/24/2011    right breast cancer /IDC,stage IIB,er/pr=+,her2=Neg  . MRSA (methicillin resistant staph aureus) culture positive     08/2011  . Breast cancer   . Lymphedema     right arm wears a sleeve  . History of chemotherapy    taxotere/cytoxan 6 cycles 09/22/11-01/05/12 day 2 neulasta  . Neuromuscular disorder     peripheral neuropathy hands feet    ALLERGIES:  is allergic to trandolapril-verapamil hcl er.  MEDICATIONS:  Current Outpatient Prescriptions  Medication Sig Dispense Refill  . albuterol (PROAIR HFA) 108 (90 BASE) MCG/ACT inhaler Inhale 2 puffs into the lungs every 6 (six) hours as needed. For shortness of breath      . cabergoline (DOSTINEX) 0.5 MG tablet Take 0.25 mg by mouth every Monday, Wednesday, and Friday.       . cyanocobalamin 500 MCG tablet Take 500 mcg by mouth daily.      Marland Kitchen dexamethasone (DECADRON) 4 MG tablet Take 2 tablets two times a day the day before Taxotere. Then take 2 tabs two times a day starting the day after chemo for 3 days.  30 tablet  1  . Fluticasone-Salmeterol (ADVAIR DISKUS) 100-50 MCG/DOSE AEPB Inhale 1 puff into the lungs daily after breakfast.       . furosemide (LASIX) 20 MG tablet Take 20 mg by mouth daily. Pt takes in am      . hyaluronate sodium (RADIAPLEXRX) GEL Apply topically once. Apply after rad tx to skin, and bedtime, not before 4 hours prior to rad tx      . HYDROcodone-acetaminophen (VICODIN) 5-500 MG per tablet Take 1 tablet by mouth every 6 (six) hours as needed.      . latanoprost (XALATAN) 0.005 % ophthalmic solution Place 1 drop  into both eyes at bedtime.       . lidocaine-prilocaine (EMLA) cream Apply topically as needed. Apply to port 45 minutes prior to chemotherapy  30 g  4  . loratadine (CLARITIN) 10 MG tablet Take 10 mg by mouth daily. Take 1 tablet with Chemo      . Multiple Vitamin (MULITIVITAMIN WITH MINERALS) TABS Take 1 tablet by mouth daily with breakfast.       . Multiple Vitamins-Minerals ([employer] DAILY MULTIPLE FOR WOMEN) TABS Take 1 tablet by mouth daily.      . non-metallic deodorant Thornton Papas) MISC Apply 1 application topically daily as needed. Apply after rad tx daily and prn but not before 4 hours to rad tx      . simvastatin (ZOCOR) 20 MG  tablet Take 20 mg by mouth daily with breakfast.       . valsartan-hydrochlorothiazide (DIOVAN-HCT) 160-25 MG per tablet Take 1 tablet by mouth daily with breakfast.         SURGICAL HISTORY:  Past Surgical History  Procedure Date  . Tubal ligation   . Dilation and curettage of uterus     x3  . Carpal tunnel release   . Hand surgery     2012  . Cholecystectomy 1987    open - Dr Wiliam Ke  . Mastectomy modified radical 08/14/11    right , ER/PR +, HER2 -  . Abdominal hysterectomy 2007    TAH/BSO  . Breast surgery     mastectomy RIGHT  . Port-a-cath insertion 09/11/11  . Portacath placement 09/11/2011    Procedure: INSERTION PORT-A-CATH;  Surgeon: Clovis Pu. Cornett, MD;  Location: [facility];  Service: General;  Laterality: N/A;  Insert of Port    REVIEW OF SYSTEMS:   General: fatigue (-), night sweats (-), fever (-), pain (-) Lymph: palpable nodes (-) HEENT: vision changes (-), mucositis (-), gum bleeding (-), epistaxis (-) Cardiovascular: chest pain (-), palpitations (-) Pulmonary: shortness of breath (-), dyspnea on exertion (-), cough (-), hemoptysis (-) GI:  Early satiety (-), melena (-), dysphagia (-), nausea/vomiting (-), diarrhea (-) GU: dysuria (-), hematuria (-), incontinence (-) Musculoskeletal: joint swelling (-), joint pain (-), back pain (-) Neuro: weakness (-), numbness (-), headache (-), confusion (-) Skin: Rash (-), lesions (-), dryness (-) Psych: depression (-), suicidal/homicidal ideation (-), feeling of hopelessness (-)   PHYSICAL EXAMINATION:  BP 144/83  Pulse 73  Temp 97.9 F (36.6 C) (Oral)  Resp 20  Ht 5\' 2"  (1.575 m)  Wt 284 lb 1.6 oz (128.867 kg)  BMI 51.96 kg/m2 General: Patient is a well appearing female in no acute distress HEENT: PERRLA, sclerae anicteric no conjunctival pallor, MMM Neck: supple, no palpable adenopathy Lungs: clear to auscultation bilaterally, no wheezes, rhonchi, or rales Cardiovascular: regular rate rhythm, S1, S2, no murmurs,  rubs or gallops Abdomen: Soft, non-tender, non-distended, normoactive bowel sounds, no HSM Extremities: warm and well perfused, no clubbing, cyanosis, or edema Skin: No rashes or lesions Neuro: Non-focal Right mastectomy scar is healing. Left breast no masses or nipple discharge. ECOG PERFORMANCE STATUS: 1 - Symptomatic but completely ambulatory    LABORATORY DATA: Lab Results  Component Value Date   WBC 4.6 03/15/2012   HGB 12.4 03/15/2012   HCT 37.8 03/15/2012   MCV 91.6 03/15/2012   PLT 145 03/15/2012      Chemistry      Component Value Date/Time   NA 140 02/19/2012 1203   NA 139 01/12/2012 1005   K 3.7 02/19/2012  1203   K 3.6 01/12/2012 1005   CL 102 02/19/2012 1203   CL 99 01/12/2012 1005   CO2 27 02/19/2012 1203   CO2 28 01/12/2012 1005   BUN 11.0 02/19/2012 1203   BUN 12 01/12/2012 1005   CREATININE 0.9 02/19/2012 1203   CREATININE 0.87 01/12/2012 1005      Component Value Date/Time   CALCIUM 9.9 02/19/2012 1203   CALCIUM 9.3 01/12/2012 1005   ALKPHOS 52 02/19/2012 1203   ALKPHOS 66 01/12/2012 1005   AST 14 02/19/2012 1203   AST 13 01/12/2012 1005   ALT 12 02/19/2012 1203   ALT 12 01/12/2012 1005   BILITOT 1.90* 02/19/2012 1203   BILITOT 1.3* 01/12/2012 1005       ASSESSMENT: 61 year old female with  #1 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 The final pathology revealed multifocal disease the largest tumor was 3 cm with a second tumor measuring 2.2 cm with focal lymphovascular space involvement. One of 15 lymph nodes were positive for metastatic disease. Postoperatively patient is doing well. The tumor was ER +100% PR +100% HER-2/neu negative with a proliferation marker 53%.  #2 It has been enrolled on clinical trial NSABP B-49 utilizing Taxotere Cytoxan x 6 cycles versus an anthracycline. Patient was randomized to Taxotere Cytoxan every 3 weeks for a total of 6 cycles. We have discussed  this with the patient. She met with our clinical research nurse Dondra Spry lot. Risks and benefits of treatment have been discussed.  #3 Patient is status post 6 cycles of Taxotere and Cytoxan following NSABP B. 49 clinical trial. Overall she tolerated her chemotherapy well without any problems.  #4 patient does have right upper extremity swelling she is seeing the lymphedema clinic and she is waiting for her lymphedema sleeve that has been ordered.  5 shortness of breath. This is significantly improved.    PLAN:  #1 Ms. Quinonez is feeling well today.  She continues to improve in regards to her breathing.  She is receiving radiation and it will complete on 03/31/12.    #2 We discussed in depth the next steps of her treatment and preparing for them.  I talked to her about taking Arimidex in detail, also giving her literature in the [facility].  She will begin taking a vitamin D supplement and a bone density scan has been ordered.    #3 Her right upper extremity swelling is doing well with her sleeve.  I have put in another referral for Ms. Ragas to return.  #4 Ms. Lunden had an echo earlier this month.  It showed an EF of 65-70% with grade 2 diastolic dysfunction.  Ms Mcginness is aware of these results.  #5 We will see Ms. Kamara back in the middle of November to start Arimidex.    All questions were answered. The patient knows to call the clinic with any problems, questions or concerns. We can certainly see the patient much sooner if necessary.  I spent >25 minutes counseling the patient face to face. The total time spent in the appointment was 30 minutes.   Cherie Ouch Lyn Hollingshead, NP Medical Oncology [facility] Phone: [phone]    03/15/2012, 11:40 AM

## 2012-03-15 NOTE — Progress Notes (Signed)
Patient here weekly radt txs right chest wall/axilla area,slight hyperpigmentation, skin intact, takes b12 500mg  daily,  Slight fatigue  , still wearing lymphedema sleve right arm, uses radiaplex gel bid  No pain 10:49 AM

## 2012-03-15 NOTE — Patient Instructions (Addendum)
Doing well.  No sign of recurrence.  You have a bone density test ordered.  I have also put in the physical therapy referral for the lymphedema clinic.  We will see you back after radiation on 11/18 to start Arimidex.   Please call us if you have any questions or concerns.     Take Vitamin D3 1000 units daily.  Anastrozole tablets (Arimidex) What is this medicine? ANASTROZOLE (an AS troe zole) is used to treat breast cancer in women who have gone through menopause. Some types of breast cancer depend on estrogen to grow, and this medicine can stop tumor growth by blocking estrogen production. This medicine may be used for other purposes; ask your health care provider or pharmacist if you have questions. What should I tell my health care provider before I take this medicine? They need to know if you have any of these conditions: -liver disease -an unusual or allergic reaction to anastrozole, other medicines, foods, dyes, or preservatives -pregnant or trying to get pregnant -breast-feeding How should I use this medicine? Take this medicine by mouth with a glass of water. Follow the directions on the prescription label. You can take this medicine with or without food. Take your doses at regular intervals. Do not take your medicine more often than directed. Do not stop taking except on the advice of your doctor or health care professional. Talk to your pediatrician regarding the use of this medicine in children. Special care may be needed. Overdosage: If you think you have taken too much of this medicine contact a poison control center or emergency room at once. NOTE: This medicine is only for you. Do not share this medicine with others. What if I miss a dose? If you miss a dose, take it as soon as you can. If it is almost time for your next dose, take only that dose. Do not take double or extra doses. What may interact with this medicine? Do not take this medicine with any of the following  medications: -female hormones, like estrogens or progestins and birth control pills This medicine may also interact with the following medications: -tamoxifen This list may not describe all possible interactions. Give your health care provider a list of all the medicines, herbs, non-prescription drugs, or dietary supplements you use. Also tell them if you smoke, drink alcohol, or use illegal drugs. Some items may interact with your medicine. What should I watch for while using this medicine? Visit your doctor or health care professional for regular checks on your progress. Let your doctor or health care professional know about any unusual vaginal bleeding. Do not treat yourself for diarrhea, nausea, vomiting or other side effects. Ask your doctor or health care professional for advice. What side effects may I notice from receiving this medicine? Side effects that you should report to your doctor or health care professional as soon as possible: -allergic reactions like skin rash, itching or hives, swelling of the face, lips, or tongue -any new or unusual symptoms -breathing problems -chest pain -leg pain or swelling -vomiting Side effects that usually do not require medical attention (report to your doctor or health care professional if they continue or are bothersome): -back or bone pain -cough, or throat infection -diarrhea or constipation -dizziness -headache -hot flashes -loss of appetite -nausea -sweating -weakness and tiredness -weight gain This list may not describe all possible side effects. Call your doctor for medical advice about side effects. You may report side effects to FDA at 1-800-FDA-1088.  Where should I keep my medicine? Keep out of the reach of children. Store at room temperature between 20 and 25 degrees C (68 and 77 degrees F). Throw away any unused medicine after the expiration date. NOTE: This sheet is a summary. It may not cover all possible information. If you  have questions about this medicine, talk to your doctor, pharmacist, or health care provider.  2012, [employer]vier/Gold Standard. (07/23/2007 4:31:52 PM)

## 2012-03-15 NOTE — Progress Notes (Signed)
smartset port orders entered under wrong MD, but already given.

## 2012-03-15 NOTE — Progress Notes (Signed)
Weekly Management Note:  Site: Right chest wall/regional lymph nodes Current Dose:  3420  cGy Projected Dose: 5680  cGy  Narrative: The patient is seen today for routine under treatment assessment. CBCT/MVCT images/port films were reviewed. The chart was reviewed.   She is without complaints today. She continues with her Radioplex gel.  Physical Examination:  Filed Vitals:   03/15/12 1046  BP: 122/78  Pulse: 77  Temp: 97.9 F (36.6 C)  Resp: 20  .  Weight: 283 lb 12.8 oz (128.731 kg). There is moderate hyperpigmentation the skin along the right chest wall and axilla with no areas of desquamation. Right upper extremity lymphedema appears to be unchanged.  Impression: Tolerating radiation therapy well.  Plan: Continue radiation therapy as planned.

## 2012-03-15 NOTE — Progress Notes (Signed)
Erroneus encounter

## 2012-03-15 NOTE — Telephone Encounter (Signed)
PER ORDERS FROM 03-15-2012 PLACED THE PATIENT ON THE FLUSH ROOM SCHEDULE FOR 12:15PM PER THE DESK NURSE FLUSH AND FLU SHOT

## 2012-03-16 ENCOUNTER — Ambulatory Visit
Admission: RE | Admit: 2012-03-16 | Discharge: 2012-03-16 | Disposition: A | Discharge status: Home / Self Care | Payer: Medicare Other | Source: Ambulatory Visit | Attending: Radiation Oncology | Admitting: Radiation Oncology

## 2012-03-17 ENCOUNTER — Ambulatory Visit: Payer: Medicare Other

## 2012-03-17 ENCOUNTER — Telehealth: Payer: Self-pay | Admitting: *Deleted

## 2012-03-17 NOTE — Telephone Encounter (Signed)
Spoke to [place] at physical therphy and she will call the patient to set up the patient's appointments

## 2012-03-18 ENCOUNTER — Ambulatory Visit: Payer: Medicare Other

## 2012-03-18 ENCOUNTER — Ambulatory Visit
Admission: RE | Admit: 2012-03-18 | Discharge: 2012-03-18 | Disposition: A | Discharge status: Home / Self Care | Payer: Medicare Other | Source: Ambulatory Visit | Attending: Radiation Oncology | Admitting: Radiation Oncology

## 2012-03-19 ENCOUNTER — Ambulatory Visit
Admission: RE | Admit: 2012-03-19 | Discharge: 2012-03-19 | Disposition: A | Discharge status: Home / Self Care | Payer: Medicare Other | Source: Ambulatory Visit | Attending: Radiation Oncology | Admitting: Radiation Oncology

## 2012-03-19 ENCOUNTER — Ambulatory Visit: Payer: Medicare Other

## 2012-03-22 ENCOUNTER — Ambulatory Visit: Payer: Medicare Other

## 2012-03-22 ENCOUNTER — Ambulatory Visit
Admission: RE | Admit: 2012-03-22 | Discharge: 2012-03-22 | Disposition: A | Discharge status: Home / Self Care | Payer: Medicare Other | Source: Ambulatory Visit | Attending: Radiation Oncology | Admitting: Radiation Oncology

## 2012-03-22 ENCOUNTER — Encounter: Payer: Self-pay | Admitting: Radiation Oncology

## 2012-03-22 ENCOUNTER — Other Ambulatory Visit: Payer: Medicare Other

## 2012-03-22 VITALS — BP 112/64 | HR 68 | Temp 97.7°F | Resp 20 | Wt 283.4 lb

## 2012-03-22 DIAGNOSIS — C50319 Malignant neoplasm of lower-inner quadrant of unspecified female breast: Secondary | ICD-10-CM

## 2012-03-22 MED ORDER — RADIAPLEXRX EX GEL
Freq: Once | CUTANEOUS | Status: AC
  Administered 2012-03-22: 11:00:00 via TOPICAL

## 2012-03-22 NOTE — Progress Notes (Signed)
Patient here weekly rad txs, right breast/chest wall /axilla areas, hype rpignmentation supraclavicular/axilla,chest wall, c/o tightness under arm and sensitive,  Using radiaplex gel bid, gave another tube, almost out 11:23 AM

## 2012-03-22 NOTE — Progress Notes (Signed)
Weekly Management Note:  Site: Right chest wall/regional lymph nodes Current Dose:  4140  cGy Projected Dose: 5680  cGy  Narrative: The patient is seen today for routine under treatment assessment. CBCT/MVCT images/port films were reviewed. The chart was reviewed.   She uses Radioplex gel. She does have right chest wall and low her axillary sensitivity.  Physical Examination:  Filed Vitals:   03/22/12 1121  BP: 112/64  Pulse: 68  Temp: 97.7 F (36.5 C)  Resp: 20  .  Weight: 283 lb 6.4 oz (128.549 kg). There is marked hyperpigmentation the skin along the right chest wall and axilla but no areas of desquamation.  Impression: Tolerating radiation therapy well.  Plan: Continue radiation therapy as planned.

## 2012-03-22 NOTE — Progress Notes (Signed)
Chart note: This is a duplicate registration for today please see dictation from earlier today.

## 2012-03-23 ENCOUNTER — Encounter: Payer: Self-pay | Admitting: Radiation Oncology

## 2012-03-23 ENCOUNTER — Ambulatory Visit: Payer: Medicare Other

## 2012-03-23 ENCOUNTER — Ambulatory Visit
Admission: RE | Admit: 2012-03-23 | Discharge: 2012-03-23 | Disposition: A | Discharge status: Home / Self Care | Payer: Medicare Other | Source: Ambulatory Visit | Attending: Radiation Oncology | Admitting: Radiation Oncology

## 2012-03-23 NOTE — Progress Notes (Signed)
Electron beam simulation note:  The patient was placed on the [facility] table. The table was slightly obliqued. She was set up RAO to her right chest wall/mastectomy scar. One custom block was constructed to conform the field. A special port plan was requested. I prescribing 1000 cGy in 5 sessions utilizing 6 MEV electrons. The dose was prescribed at the 90% isodose curve. 0.5 cm custom bolus will be constructed in apply to her skin on first day of treatment tomorrow.

## 2012-03-24 ENCOUNTER — Ambulatory Visit
Admission: RE | Admit: 2012-03-24 | Discharge: 2012-03-24 | Disposition: A | Discharge status: Home / Self Care | Payer: Medicare Other | Source: Ambulatory Visit | Attending: Radiation Oncology | Admitting: Radiation Oncology

## 2012-03-24 ENCOUNTER — Ambulatory Visit: Payer: Medicare Other

## 2012-03-25 ENCOUNTER — Ambulatory Visit
Admission: RE | Admit: 2012-03-25 | Discharge: 2012-03-25 | Disposition: A | Discharge status: Home / Self Care | Payer: Medicare Other | Source: Ambulatory Visit | Attending: Radiation Oncology | Admitting: Radiation Oncology

## 2012-03-25 ENCOUNTER — Ambulatory Visit: Payer: Medicare Other

## 2012-03-26 ENCOUNTER — Ambulatory Visit
Admission: RE | Admit: 2012-03-26 | Discharge: 2012-03-26 | Disposition: A | Discharge status: Home / Self Care | Payer: Medicare Other | Source: Ambulatory Visit | Attending: Radiation Oncology | Admitting: Radiation Oncology

## 2012-03-26 ENCOUNTER — Ambulatory Visit: Payer: Medicare Other

## 2012-03-29 ENCOUNTER — Ambulatory Visit: Payer: Medicare Other

## 2012-03-29 ENCOUNTER — Encounter: Payer: Self-pay | Admitting: Radiation Oncology

## 2012-03-29 ENCOUNTER — Ambulatory Visit
Admission: RE | Admit: 2012-03-29 | Discharge: 2012-03-29 | Disposition: A | Discharge status: Home / Self Care | Payer: Medicare Other | Source: Ambulatory Visit | Attending: Radiation Oncology | Admitting: Radiation Oncology

## 2012-03-29 VITALS — BP 136/65 | HR 79 | Temp 97.5°F | Resp 20 | Wt 280.7 lb

## 2012-03-29 DIAGNOSIS — C50319 Malignant neoplasm of lower-inner quadrant of unspecified female breast: Secondary | ICD-10-CM

## 2012-03-29 NOTE — Progress Notes (Signed)
Patient here weekly rad txs, right chest wall/axilla area 2 more txs and will be done, hyperpigmentation on chest wall/axilla area, dryness thinning skin under axilla, "burning sensation" there states patient, uses radiaplex gel bid, wating well,drinking plentywater 10:48 AM

## 2012-03-29 NOTE — Progress Notes (Signed)
Weekly Management Note:  Site: Right chest wall/mastectomy scar boost Current Dose:  5100  cGy Projected Dose: 5500  cGy  Narrative: The patient is seen today for routine under treatment assessment. CBCT/MVCT images/port films were reviewed. The chart was reviewed.   No new complaints today. She uses Radioplex gel. She'll finish her radiation therapy this Wednesday.  Physical Examination:  Filed Vitals:   03/29/12 1045  BP: 136/65  Pulse: 79  Temp: 97.5 F (36.4 C)  Resp: 20  .  Weight: 280 lb 11.2 oz (127.325 kg). There is marked hyperpigmentation the skin along the right chest wall/axilla with patchy dry desquamation. No areas of moist desquamation.  Impression: Tolerating radiation therapy well.  Plan: Continue radiation therapy as planned. She'll finish her treatment this Wednesday and see me back for a followup visit in one month. She will make her appointment today.

## 2012-03-30 ENCOUNTER — Ambulatory Visit
Admission: RE | Admit: 2012-03-30 | Discharge: 2012-03-30 | Disposition: A | Discharge status: Home / Self Care | Payer: Medicare Other | Source: Ambulatory Visit | Attending: Radiation Oncology | Admitting: Radiation Oncology

## 2012-03-30 ENCOUNTER — Ambulatory Visit: Payer: Medicare Other

## 2012-03-31 ENCOUNTER — Ambulatory Visit: Payer: Medicare Other | Attending: Oncology | Admitting: Physical Therapy

## 2012-03-31 ENCOUNTER — Ambulatory Visit
Admission: RE | Admit: 2012-03-31 | Discharge: 2012-03-31 | Disposition: A | Discharge status: Home / Self Care | Payer: Medicare Other | Source: Ambulatory Visit | Attending: Radiation Oncology | Admitting: Radiation Oncology

## 2012-03-31 DIAGNOSIS — IMO0001 Reserved for inherently not codable concepts without codable children: Secondary | ICD-10-CM | POA: Insufficient documentation

## 2012-03-31 DIAGNOSIS — I890 Lymphedema, not elsewhere classified: Secondary | ICD-10-CM | POA: Insufficient documentation

## 2012-04-01 ENCOUNTER — Encounter: Payer: Self-pay | Admitting: Radiation Oncology

## 2012-04-01 ENCOUNTER — Ambulatory Visit
Admission: RE | Admit: 2012-04-01 | Discharge: 2012-04-01 | Disposition: A | Discharge status: Home / Self Care | Payer: Medicare Other | Source: Ambulatory Visit | Attending: Neurosurgery | Admitting: Neurosurgery

## 2012-04-01 DIAGNOSIS — D497 Neoplasm of unspecified behavior of endocrine glands and other parts of nervous system: Secondary | ICD-10-CM

## 2012-04-01 MED ORDER — GADOBENATE DIMEGLUMINE 529 MG/ML IV SOLN
10.0000 mL | Freq: Once | INTRAVENOUS | Status: AC | PRN
  Administered 2012-04-01: 10 mL via INTRAVENOUS

## 2012-04-01 NOTE — Progress Notes (Signed)
[facility] Radiation Oncology End of Treatment Note  Name:[name]  Date: 04/01/2012 UJW:[id number] DOB:08/04/1950   Status:outpatient    CC: Delorse Lek, MD  Dr. Cicero Duck  REFERRING PHYSICIAN:   Dr. Cicero Duck   DIAGNOSIS:  Stage IIB (T2 N1 M0) multifocal invasive ductal/DCIS of the right breast  INDICATION FOR TREATMENT: Curative   TREATMENT DATES: 02/18/2012 through 03/31/2012                          SITE/DOSE:  Right chest wall/regional lymph nodes 4500 cGy 25 sessions, right chest wall/mastectomy scar boost 1000 cGy 5 sessions                          BEAMS/ENERGY:   Mixed energy/18 next photons tangential fields to the right chest wall with custom 1.0 custom bolus applied to the skin every other day to maximize the dose to the skin surface .   18 MV photons, LAO to the right supraclavicular/axillary region, doses prescribed at 3 cm depth. 6 MV photons directed to the right axilla, PA to bring the midaxillary dose to 4500 cGy in 25 sessions. 6 MEV electrons, right chest wall/mastectomy scar boost with 0.5 cm custom bolus to maximize the dose to the skin surface.            NARRATIVE:  The patient tolerated treatment well although she developed marked hyperpigmentation of the skin along with dry desquamation the skin along the axilla and chest wall as expected.                          PLAN: Routine followup in one month. Patient instructed to call if questions or worsening complaints in interim.

## 2012-04-02 ENCOUNTER — Encounter (INDEPENDENT_AMBULATORY_CARE_PROVIDER_SITE_OTHER): Payer: Self-pay | Admitting: Surgery

## 2012-04-06 ENCOUNTER — Ambulatory Visit: Payer: Medicare Other | Admitting: Physical Therapy

## 2012-04-09 ENCOUNTER — Ambulatory Visit: Payer: Medicare Other | Admitting: Physical Therapy

## 2012-04-12 ENCOUNTER — Encounter: Payer: Self-pay | Admitting: Oncology

## 2012-04-12 ENCOUNTER — Telehealth: Payer: Self-pay | Admitting: *Deleted

## 2012-04-12 ENCOUNTER — Other Ambulatory Visit (HOSPITAL_BASED_OUTPATIENT_CLINIC_OR_DEPARTMENT_OTHER): Payer: Medicare Other | Admitting: Lab

## 2012-04-12 ENCOUNTER — Telehealth (INDEPENDENT_AMBULATORY_CARE_PROVIDER_SITE_OTHER): Payer: Self-pay | Admitting: General Surgery

## 2012-04-12 ENCOUNTER — Ambulatory Visit (HOSPITAL_BASED_OUTPATIENT_CLINIC_OR_DEPARTMENT_OTHER): Payer: Medicare Other | Admitting: Oncology

## 2012-04-12 VITALS — BP 145/84 | HR 69 | Temp 97.9°F | Resp 20 | Ht 62.0 in | Wt 284.6 lb

## 2012-04-12 DIAGNOSIS — E559 Vitamin D deficiency, unspecified: Secondary | ICD-10-CM

## 2012-04-12 DIAGNOSIS — C50319 Malignant neoplasm of lower-inner quadrant of unspecified female breast: Secondary | ICD-10-CM

## 2012-04-12 DIAGNOSIS — M8580 Other specified disorders of bone density and structure, unspecified site: Secondary | ICD-10-CM

## 2012-04-12 DIAGNOSIS — C773 Secondary and unspecified malignant neoplasm of axilla and upper limb lymph nodes: Secondary | ICD-10-CM

## 2012-04-12 LAB — COMPREHENSIVE METABOLIC PANEL (CC13)
Albumin: 3.8 g/dL (ref 3.5–5.0)
BUN: 13 mg/dL (ref 7.0–26.0)
CO2: 32 mEq/L — ABNORMAL HIGH (ref 22–29)
Calcium: 9.9 mg/dL (ref 8.4–10.4)
Chloride: 103 mEq/L (ref 98–107)
Glucose: 106 mg/dl — ABNORMAL HIGH (ref 70–99)
Potassium: 3.6 mEq/L (ref 3.5–5.1)
Sodium: 142 mEq/L (ref 136–145)
Total Protein: 7 g/dL (ref 6.4–8.3)

## 2012-04-12 LAB — CBC WITH DIFFERENTIAL/PLATELET
Basophils Absolute: 0 10*3/uL (ref 0.0–0.1)
Eosinophils Absolute: 0.1 10*3/uL (ref 0.0–0.5)
HGB: 12.6 g/dL (ref 11.6–15.9)
MCV: 89.6 fL (ref 79.5–101.0)
MONO#: 0.4 10*3/uL (ref 0.1–0.9)
MONO%: 10.2 % (ref 0.0–14.0)
NEUT#: 2.6 10*3/uL (ref 1.5–6.5)
Platelets: 148 10*3/uL (ref 145–400)
RBC: 4.15 10*6/uL (ref 3.70–5.45)
RDW: 15.5 % — ABNORMAL HIGH (ref 11.2–14.5)
WBC: 3.9 10*3/uL (ref 3.9–10.3)

## 2012-04-12 MED ORDER — ANASTROZOLE 1 MG PO TABS: 1.0000 mg | ORAL_TABLET | Freq: Every day | ORAL | Status: DC

## 2012-04-12 MED ORDER — UNABLE TO FIND: Status: DC

## 2012-04-12 NOTE — Progress Notes (Signed)
OFFICE PROGRESS NOTE  CC  [name],[name], MD [address] [place] [address] Dr. Chipper Herb Dr. 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 Invasive ductal carcinoma (T3 NX MX).   #2 Patient has gone on to have right simple mastectomy with the final pathology revealing invasive and in situ ductal carcinoma. 2 nodules were seen 3 cm and 2.2 cm margins were negative there was focal lymphovascular space involvement by tumor. 1/15 sentinel node was positive for metastatic disease.  #3 the tumor was estrogen receptor +100% progesterone receptor +100% and HER-2/neu negative proliferation marker 53%. Final pathologic staging was pT2 N1 A. MX.  # 4. Taxotere/Cytoxan q 3 weeks beginning on 09/22/11 on NSBP B-49 Patient has completed total of 6 cycles of Taxotere Cytoxan From 09/22/2011 -  01/05/2012.  #5 patient is now status post radiation therapy to the right chest wall and axilla completed on 03/31/2012.  #6 she will begin adjuvant antiestrogen therapy consisting of Arimidex 1 mg daily. A total of 5 years of therapy is planned.  CURRENT THERAPY:   INTERVAL HISTORY: [name] 61 y.o. female returns for Followup visit. Overall patient is feeling quite well. She has lost a little bit more weight. She has no shortness of breath she denies any fevers chills night sweats headaches shortness of breath no myalgias and arthralgias. She does have right upper extremity lymphedema she is being seen by the lymphedema clinic and undergoing physical therapy. She has no fevers chills or night sweats. No vaginal bleeding or discharge. No abdominal pain diarrhea or constipation. Remainder of the 10 point review of systems is unremarkable.  MEDICAL  HISTORY: Past Medical History  Diagnosis Date  . Hypertension   . Asthma   . Hyperlipemia   . Heart murmur   . Arthritis   . OSA (obstructive sleep apnea)     CPAP settings- ?   . Blood transfusion     hx of last one in 1987  . Cancer of lower-inner quadrant of female breast 07/24/2011    right breast cancer /IDC,stage IIB,er/pr=+,her2=Neg  . MRSA (methicillin resistant staph aureus) culture positive     08/2011  . Breast cancer   . Lymphedema     right arm wears a sleeve  . History of chemotherapy     taxotere/cytoxan 6 cycles 09/22/11-01/05/12 day 2 neulasta  . Neuromuscular disorder     peripheral neuropathy hands feet    ALLERGIES:  is allergic to trandolapril-verapamil hcl er.   SURGICAL HISTORY:  Past Surgical History  Procedure Date  . Tubal ligation   . Dilation and curettage of uterus     x3  . Carpal tunnel release   . Hand surgery     2012  . Cholecystectomy 1987    open - Dr Wiliam Ke  . Mastectomy modified radical 08/14/11    right , ER/PR +, HER2 -  . Abdominal hysterectomy 2007    TAH/BSO  . Breast surgery     mastectomy RIGHT  . Port-a-cath insertion 09/11/11  . Portacath placement 09/11/2011    Procedure: INSERTION PORT-A-CATH;  Surgeon: Clovis Pu. Cornett, MD;  Location: [facility];  Service: General;  Laterality: N/A;  Insert of Port    REVIEW OF SYSTEMS:   General: fatigue (-), night  sweats (-), fever (-), pain (-) Lymph: palpable nodes (-) HEENT: vision changes (-), mucositis (-), gum bleeding (-), epistaxis (-) Cardiovascular: chest pain (-), palpitations (-) Pulmonary: shortness of breath (-), dyspnea on exertion (-), cough (-), hemoptysis (-) GI:  Early satiety (-), melena (-), dysphagia (-), nausea/vomiting (-), diarrhea (-) GU: dysuria (-), hematuria (-), incontinence (-) Musculoskeletal: joint swelling (-), joint pain (-), back pain (-) Neuro: weakness (-), numbness (-), headache (-), confusion (-) Skin: Rash (-), lesions (-), dryness (-) Psych:  depression (-), suicidal/homicidal ideation (-), feeling of hopelessness (-)   PHYSICAL EXAMINATION:  BP 145/84  Pulse 69  Temp 97.9 F (36.6 C) (Oral)  Resp 20  Ht 5\' 2"  (1.575 m)  Wt 284 lb 9.6 oz (129.094 kg)  BMI 52.05 kg/m2 General: Patient is a well appearing female in no acute distress HEENT: PERRLA, sclerae anicteric no conjunctival pallor, MMM Neck: supple, no palpable adenopathy Lungs: clear to auscultation bilaterally, no wheezes, rhonchi, or rales Cardiovascular: regular rate rhythm, S1, S2, no murmurs, rubs or gallops Abdomen: Soft, non-tender, non-distended, normoactive bowel sounds, no HSM Extremities: warm and well perfused, no clubbing, cyanosis, or edema Skin: No rashes or lesions Neuro: Non-focal Right mastectomy scar is healing. Left breast no masses or nipple discharge. 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 The final pathology revealed multifocal disease the largest tumor was 3 cm with a second tumor measuring 2.2 cm with focal lymphovascular space involvement. One of 15 lymph nodes were positive for metastatic disease. Postoperatively patient is doing well. The tumor was ER +100% PR +100% HER-2/neu negative with a proliferation marker 53%.  #2 It has been enrolled on clinical trial NSABP B-49 utilizing Taxotere Cytoxan x 6 cycles versus an anthracycline. Patient was randomized to Taxotere Cytoxan every 3 weeks for a total of 6 cycles. We have discussed this with the patient. She met with our clinical research nurse Dondra Spry lot. Risks and benefits of treatment have been discussed.  #3 Patient is  status post 6 cycles of Taxotere and Cytoxan following NSABP B. 49 clinical trial. Overall she tolerated her chemotherapy well without any problems.  #4 patient does have right upper extremity swelling she is seeing the lymphedema clinic and she is waiting for her lymphedema sleeve that has been ordered.  5 shortness of breath. This is significantly improved.    PLAN:  #1 proceed with Arimidex 1 mg daily. Risks and benefits of Arimidex were discussed with the patient in detail. Prescription was sent to her  pharmacy.  #2 we will get her set up for a bone density scan since it has been many years and she is at risk for osteopenia/osteoporosis.  #3 I will refer her back to Dr. Jamey Ripa for removal of Port-A-Cath.  #4 she will return in 2 months time for followup.  All questions were answered. The patient knows to call the clinic with any problems, questions or concerns. We can certainly see the patient much sooner if necessary.  I spent 25 minutes counseling the patient face to face. The total time spent in the appointment was  30 minutes.  Drue Second, MD Medical/Oncology [facility] [phone] (beeper) [phone] (Office)  04/12/2012, 9:20 AM

## 2012-04-12 NOTE — Patient Instructions (Addendum)
Proceed with arimidex 1 mg daily  We ill set up a bone density scan  I will see you back in 2 months  Will have Dr. Jamey Ripa remove the port

## 2012-04-12 NOTE — Telephone Encounter (Signed)
Order for breast prothesis -athletic swim wear faxed to [employer]. [phone]

## 2012-04-12 NOTE — Telephone Encounter (Signed)
Gave patient appointment for bone density

## 2012-04-12 NOTE — Telephone Encounter (Signed)
Patient called back. She is in no rush to get [facility] removed and wants to wait to talk about it in January when she comes in for her appt with Dr Jamey Ripa.

## 2012-04-12 NOTE — Telephone Encounter (Signed)
Left message on machine for patient to call back and ask for me. Calling to see if patient ready to schedule port a cath removal. Awaiting patient's call.

## 2012-04-12 NOTE — Telephone Encounter (Signed)
Message copied by Liliana Cline on Mon Apr 12, 2012 11:46 AM ------      Message from: Currie Paris      Created: Mon Apr 12, 2012  9:40 AM      Regarding: FW: port removal       See if she wants to schedule anytime soon and if so will put orders in      ----- Message -----         From: Victorino December, MD         Sent: 04/12/2012   9:33 AM           To: Currie Paris, MD      Subject: port removal                                             Patient can have her port removed

## 2012-04-13 ENCOUNTER — Ambulatory Visit: Payer: Medicare Other | Admitting: Physical Therapy

## 2012-04-15 ENCOUNTER — Ambulatory Visit: Payer: Medicare Other | Admitting: Physical Therapy

## 2012-04-19 ENCOUNTER — Ambulatory Visit: Payer: Medicare Other | Admitting: Physical Therapy

## 2012-04-20 ENCOUNTER — Ambulatory Visit: Payer: Medicare Other | Admitting: Physical Therapy

## 2012-04-21 ENCOUNTER — Ambulatory Visit
Admission: RE | Admit: 2012-04-21 | Discharge: 2012-04-21 | Disposition: A | Discharge status: Home / Self Care | Payer: Medicare Other | Source: Ambulatory Visit | Attending: Oncology | Admitting: Oncology

## 2012-04-21 DIAGNOSIS — M8580 Other specified disorders of bone density and structure, unspecified site: Secondary | ICD-10-CM

## 2012-04-26 ENCOUNTER — Telehealth: Payer: Self-pay | Admitting: *Deleted

## 2012-04-26 NOTE — Telephone Encounter (Signed)
Message copied by Cooper Render on Mon Apr 26, 2012  9:25 AM ------      Message from: Victorino December      Created: Wed Apr 21, 2012  3:19 PM       Call patient: bone density normal

## 2012-04-26 NOTE — Telephone Encounter (Signed)
Per MD, notified pt Bone density normal. Pt verbalized understanding.

## 2012-04-27 ENCOUNTER — Encounter: Payer: Self-pay | Admitting: Radiation Oncology

## 2012-04-27 ENCOUNTER — Ambulatory Visit
Admission: RE | Admit: 2012-04-27 | Discharge: 2012-04-27 | Disposition: A | Discharge status: Home / Self Care | Payer: Medicare Other | Source: Ambulatory Visit | Attending: Radiation Oncology | Admitting: Radiation Oncology

## 2012-04-27 VITALS — BP 142/64 | HR 67 | Temp 97.8°F | Resp 20 | Wt 285.4 lb

## 2012-04-27 DIAGNOSIS — C50319 Malignant neoplasm of lower-inner quadrant of unspecified female breast: Secondary | ICD-10-CM

## 2012-04-27 NOTE — Progress Notes (Signed)
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 She is without complaints today. She is on Arimidex through Dr. Welton Flakes. She will see her again in January. She continues with physical therapy for her right upper extremity lymphedema which is stable. She'll see Dr. Jamey Ripa again on January 7. She hopes to have her Port-A-Cath removed shortly thereafter.  Physical examination: Alert and oriented. Wt Readings from Last 3 Encounters:  04/27/12 285 lb 6.4 oz (129.457 kg)  04/12/12 284 lb 9.6 oz (129.094 kg)  03/29/12 280 lb 11.2 oz (127.325 kg)   Temp Readings from Last 3 Encounters:  04/27/12 97.8 F (36.6 C)   04/12/12 97.9 F (36.6 C) Oral  03/29/12 97.5 F (36.4 C) Oral   BP Readings from Last 3 Encounters:  04/27/12 142/64  04/12/12 145/84  03/29/12 136/65   Pulse Readings from Last 3 Encounters:  04/27/12 67  04/12/12 69  03/29/12 79   Head and neck examination: She has regrowth of hair. Nodes: Without palpable cervical, supraclavicular, or axillary lymphadenopathy. Chest: There is residual hyperpigmentation the skin with dry desquamation along her right chest wall. No masses or evidence for recurrent disease are noted. Lungs are clear. Left breast without masses or lesions. Abdomen: Obese. I do not feel her liver edge. Extremities: There is 3+ right upper extremity lymphedema which is unchanged. She wears an elastic sleeve.  Impression: Satisfactory progress. Followup with Dr. Jamey Ripa and Dr. Welton Flakes in January. I have not scheduled the patient for a formal followup visit, but I would be more than happy to see her in the future should the need arise.

## 2012-04-27 NOTE — Progress Notes (Signed)
Patient here follow rad txs right breast,02/18/12-03/31/12 Alert,oriented x3, right arm with lymphedema sleve, hyperpigmentation, dry skin under am and breast area, , no c/o pain, , on Armidix 1mg  po daily, and new eye drops ., eating well, 10:29 AM

## 2012-04-28 ENCOUNTER — Ambulatory Visit: Payer: Medicare Other | Attending: Oncology | Admitting: Physical Therapy

## 2012-04-28 DIAGNOSIS — IMO0001 Reserved for inherently not codable concepts without codable children: Secondary | ICD-10-CM | POA: Insufficient documentation

## 2012-04-28 DIAGNOSIS — I890 Lymphedema, not elsewhere classified: Secondary | ICD-10-CM | POA: Insufficient documentation

## 2012-05-04 ENCOUNTER — Ambulatory Visit: Payer: Medicare Other | Admitting: Physical Therapy

## 2012-05-06 ENCOUNTER — Ambulatory Visit: Payer: Medicare Other | Admitting: Physical Therapy

## 2012-05-12 ENCOUNTER — Ambulatory Visit: Payer: Medicare Other | Admitting: Physical Therapy

## 2012-05-14 ENCOUNTER — Ambulatory Visit: Payer: Medicare Other | Admitting: Physical Therapy

## 2012-05-17 ENCOUNTER — Ambulatory Visit: Payer: Medicare Other | Admitting: Physical Therapy

## 2012-05-21 ENCOUNTER — Ambulatory Visit: Payer: Medicare Other | Admitting: Physical Therapy

## 2012-05-24 ENCOUNTER — Ambulatory Visit: Payer: Medicare Other | Admitting: Physical Therapy

## 2012-06-01 ENCOUNTER — Ambulatory Visit (INDEPENDENT_AMBULATORY_CARE_PROVIDER_SITE_OTHER): Payer: Medicare Other | Admitting: Surgery

## 2012-06-01 ENCOUNTER — Encounter (INDEPENDENT_AMBULATORY_CARE_PROVIDER_SITE_OTHER): Payer: Self-pay | Admitting: Surgery

## 2012-06-01 VITALS — BP 132/82 | HR 78 | Resp 18 | Ht 62.0 in | Wt 286.0 lb

## 2012-06-01 DIAGNOSIS — Z853 Personal history of malignant neoplasm of breast: Secondary | ICD-10-CM

## 2012-06-01 NOTE — Progress Notes (Signed)
NAME: [name]       DOB: 08/28/1950           DATE: 06/01/2012       MRN: [id number]   [name] is a 61 y.o..female who presents for routine followup of her Right breast cancer, IDC, Stage IIB, receptor +, Her2 neg diagnosed in 2013 and treated with Mastectomy and now finished radiation She has no problems or concerns on either side.She has right arm lymphedema, but this is stable with a sleeve. She wants to get her port out  PFSH: She has had no significant changes since the last visit here.  ROS: There have been no significant changes since the last visit here  EXAM:  VS: BP 132/82  Pulse 78  Resp 18  Ht 5' 2" (1.575 m)  Wt 286 lb (129.729 kg)  BMI 52.31 kg/m2   General: The patient is alert, oriented, generally healthy appearing, NAD. Mood and affect are normal.  Breasts:  The right breast is surgically absent. No issues; left is wnl Port-A-Cath appears to be intact with no issues.  Lymphatics: She has no axillary or supraclavicular adenopathy on either side.  Extremities: Full ROM of the surgical side with  Moderate lymphedema noted.  Data Reviewed: Notes on radiation, bone density was normal   Impression: Doing well, with no evidence of recurrent cancer or new cancer  Plan: She'll come back in six months. We will check with Dr Khan iand if OK will schedule port removal  

## 2012-06-01 NOTE — Patient Instructions (Signed)
I will see you again in six months. Have your mammogram report sent to me. I will contact Dr Welton Flakes and if she gives the OK we will call you to schedule removal of your port under local anesthsia

## 2012-06-02 ENCOUNTER — Other Ambulatory Visit (INDEPENDENT_AMBULATORY_CARE_PROVIDER_SITE_OTHER): Payer: Self-pay | Admitting: Surgery

## 2012-06-07 ENCOUNTER — Telehealth: Payer: Self-pay | Admitting: *Deleted

## 2012-06-07 NOTE — Telephone Encounter (Signed)
Pt called [facility], needing Breast prothesis for water aerobics. Order form faxed to [facility].

## 2012-06-15 ENCOUNTER — Other Ambulatory Visit: Payer: Self-pay | Admitting: Obstetrics and Gynecology

## 2012-06-15 DIAGNOSIS — Z853 Personal history of malignant neoplasm of breast: Secondary | ICD-10-CM

## 2012-06-15 DIAGNOSIS — Z9011 Acquired absence of right breast and nipple: Secondary | ICD-10-CM

## 2012-06-16 NOTE — Progress Notes (Signed)
Coming for [facility] out-local

## 2012-06-21 ENCOUNTER — Telehealth: Payer: Self-pay | Admitting: Oncology

## 2012-06-21 ENCOUNTER — Other Ambulatory Visit (HOSPITAL_BASED_OUTPATIENT_CLINIC_OR_DEPARTMENT_OTHER): Payer: Medicare Other | Admitting: Lab

## 2012-06-21 ENCOUNTER — Ambulatory Visit (HOSPITAL_BASED_OUTPATIENT_CLINIC_OR_DEPARTMENT_OTHER): Payer: Medicare Other | Admitting: Oncology

## 2012-06-21 ENCOUNTER — Encounter: Payer: Self-pay | Admitting: Oncology

## 2012-06-21 VITALS — BP 119/76 | HR 85 | Temp 98.4°F | Resp 20 | Ht 62.0 in | Wt 291.9 lb

## 2012-06-21 DIAGNOSIS — I972 Postmastectomy lymphedema syndrome: Secondary | ICD-10-CM

## 2012-06-21 DIAGNOSIS — C50319 Malignant neoplasm of lower-inner quadrant of unspecified female breast: Secondary | ICD-10-CM

## 2012-06-21 DIAGNOSIS — Z170 Estrogen receptor positive status [ER+]: Secondary | ICD-10-CM

## 2012-06-21 DIAGNOSIS — E559 Vitamin D deficiency, unspecified: Secondary | ICD-10-CM

## 2012-06-21 LAB — CBC WITH DIFFERENTIAL/PLATELET
Basophils Absolute: 0 10*3/uL (ref 0.0–0.1)
Eosinophils Absolute: 0.2 10*3/uL (ref 0.0–0.5)
HCT: 34.6 % — ABNORMAL LOW (ref 34.8–46.6)
HGB: 11.7 g/dL (ref 11.6–15.9)
MONO#: 0.3 10*3/uL (ref 0.1–0.9)
NEUT#: 2.7 10*3/uL (ref 1.5–6.5)
NEUT%: 59.2 % (ref 38.4–76.8)
WBC: 4.5 10*3/uL (ref 3.9–10.3)
lymph#: 1.3 10*3/uL (ref 0.9–3.3)

## 2012-06-21 LAB — COMPREHENSIVE METABOLIC PANEL (CC13)
Albumin: 3.7 g/dL (ref 3.5–5.0)
BUN: 17.6 mg/dL (ref 7.0–26.0)
Calcium: 10.1 mg/dL (ref 8.4–10.4)
Chloride: 100 mEq/L (ref 98–107)
Glucose: 114 mg/dl — ABNORMAL HIGH (ref 70–99)
Potassium: 3.5 mEq/L (ref 3.5–5.1)
Sodium: 143 mEq/L (ref 136–145)
Total Protein: 7.4 g/dL (ref 6.4–8.3)

## 2012-06-21 NOTE — Telephone Encounter (Signed)
gv pt appt schedule for July.  

## 2012-06-21 NOTE — Patient Instructions (Addendum)
Continue arimidex daily  Exercise and eating healthy will help with hot flashes and fatigue  I will see you back in 6 months but call if you need help

## 2012-06-21 NOTE — Progress Notes (Signed)
OFFICE PROGRESS NOTE  CC  [name],[name], MD [address] [place] [address] Dr. Chipper Herb Dr. 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 Invasive ductal carcinoma (T3 NX MX).   #2 Patient has gone on to have right simple mastectomy with the final pathology revealing invasive and in situ ductal carcinoma. 2 nodules were seen 3 cm and 2.2 cm margins were negative there was focal lymphovascular space involvement by tumor. 1/15 sentinel node was positive for metastatic disease.  #3 the tumor was estrogen receptor +100% progesterone receptor +100% and HER-2/neu negative proliferation marker 53%. Final pathologic staging was pT2 N1 A. MX.  # 4. Taxotere/Cytoxan q 3 weeks beginning on 09/22/11 on NSBP B-49 Patient has completed total of 6 cycles of Taxotere Cytoxan From 09/22/2011 -  01/05/2012.  #5 patient is now status post radiation therapy to the right chest wall and axilla completed on 03/31/2012.  #6 she will begin adjuvant antiestrogen therapy consisting of Arimidex 1 mg daily. A total of 5 years of therapy is planned.  #7 patient had a bone density scan performed which is normal the  CURRENT THERAPY: Arimidex 1 mg daily starting November 2013  INTERVAL HISTORY: [name] 62 y.o. female returns for Followup visit. She has been on Arimidex for about 2-3 months now. Overall she's tolerating it well however she is complaining of hot flashes as well as some aches and pains. She is taking it at nighttime to sleep off the aches and pains and the hot flashes. She looks great she denies any fevers chills headaches shortness of breath chest pains or palpitations no nausea or vomiting. Remainder of the 10 point review of systems is  negative. MEDICAL HISTORY: Past Medical History  Diagnosis Date  . Hypertension   . Asthma   . Hyperlipemia   . Heart murmur   . Arthritis   . OSA (obstructive sleep apnea)     CPAP settings- ?   . Blood transfusion     hx of last one in 1987  . Cancer of lower-inner quadrant of female breast 07/24/2011    right breast cancer /IDC,stage IIB,er/pr=+,her2=Neg  . MRSA (methicillin resistant staph aureus) culture positive     08/2011  . Breast cancer   . Lymphedema     right arm wears a sleeve  . History of chemotherapy     taxotere/cytoxan 6 cycles 09/22/11-01/05/12 day 2 neulasta  . Neuromuscular disorder     peripheral neuropathy hands feet    ALLERGIES:  is allergic to trandolapril-verapamil hcl er.   SURGICAL HISTORY:  Past Surgical History  Procedure Date  . Tubal ligation   . Dilation and curettage of uterus     x3  . Carpal tunnel release   . Hand surgery     2012  . Cholecystectomy 1987    open - Dr Wiliam Ke  . Mastectomy modified radical 08/14/11    right , ER/PR +, HER2 -  . Abdominal hysterectomy 2007    TAH/BSO  . Breast surgery     mastectomy RIGHT  . Port-a-cath insertion 09/11/11  . Portacath placement 09/11/2011    Procedure: INSERTION PORT-A-CATH;  Surgeon: Clovis Pu. Cornett, MD;  Location: [facility];  Service: General;  Laterality: N/A;  Insert of Port  REVIEW OF SYSTEMS:   General: fatigue (-), night sweats (-), fever (-), pain (-) Lymph: palpable nodes (-) HEENT: vision changes (-), mucositis (-), gum bleeding (-), epistaxis (-) Cardiovascular: chest pain (-), palpitations (-) Pulmonary: shortness of breath (-), dyspnea on exertion (-), cough (-), hemoptysis (-) GI:  Early satiety (-), melena (-), dysphagia (-), nausea/vomiting (-), diarrhea (-) GU: dysuria (-), hematuria (-), incontinence (-) Musculoskeletal: joint swelling (-), joint pain (-), back pain (-) Neuro: weakness (-), numbness (-), headache (-), confusion (-) Skin: Rash (-), lesions (-),  dryness (-) Psych: depression (-), suicidal/homicidal ideation (-), feeling of hopelessness (-)   PHYSICAL EXAMINATION:  BP 119/76  Pulse 85  Temp 98.4 F (36.9 C) (Oral)  Resp 20  Ht 5\' 2"  (1.575 m)  Wt 291 lb 14.4 oz (132.405 kg)  BMI 53.39 kg/m2 General: Patient is a well appearing female in no acute distress HEENT: PERRLA, sclerae anicteric no conjunctival pallor, MMM Neck: supple, no palpable adenopathy Lungs: clear to auscultation bilaterally, no wheezes, rhonchi, or rales Cardiovascular: regular rate rhythm, S1, S2, no murmurs, rubs or gallops Abdomen: Soft, non-tender, non-distended, normoactive bowel sounds, no HSM Extremities: warm and well perfused, no clubbing, cyanosis, or edema Skin: No rashes or lesions Neuro: Non-focal Right mastectomy scar is healing. Left breast no masses or nipple discharge. 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 The final pathology revealed multifocal disease the largest tumor was 3 cm with a second tumor measuring 2.2 cm with focal lymphovascular space involvement. One of 15 lymph nodes were positive for metastatic disease. Postoperatively patient is doing well. The tumor was ER +100% PR +100% HER-2/neu negative with a proliferation marker 53%.  #2 It has been enrolled on clinical trial NSABP B-49 utilizing Taxotere Cytoxan x 6 cycles versus an anthracycline. Patient was randomized to Taxotere Cytoxan every 3 weeks for a total of 6 cycles. We have discussed this with the patient. She met with our clinical research nurse Dondra Spry lot. Risks and benefits of treatment have been  discussed.  #3 Patient is status post 6 cycles of Taxotere and Cytoxan following NSABP B. 49 clinical trial. Overall she tolerated her chemotherapy well without any problems.  #4 patient does have right upper extremity swelling she is seeing the lymphedema clinic and she is waiting for her lymphedema sleeve that has been ordered.    PLAN:  #1 continue Arimidex 1 mg daily.   #2 she will have her port removed tomorrow   #3 she will be joining the [facility] program [facility].  #4 she will be seen back in 6 months time in followup.  All questions were answered. The patient knows to call the clinic with any problems, questions or concerns. We can certainly see the patient much sooner if necessary.  I spent 25 minutes counseling the patient face to face. The total time spent in the appointment was 30 minutes.  Drue Second, MD Medical/Oncology [facility] [phone] (beeper) [phone] (Office)  06/21/2012, 10:02 AM

## 2012-06-22 ENCOUNTER — Ambulatory Visit (HOSPITAL_BASED_OUTPATIENT_CLINIC_OR_DEPARTMENT_OTHER)
Admission: RE | Admit: 2012-06-22 | Discharge: 2012-06-22 | Disposition: A | Discharge status: Home / Self Care | Payer: Medicare Other | Source: Ambulatory Visit | Attending: Surgery | Admitting: Surgery

## 2012-06-22 ENCOUNTER — Encounter (HOSPITAL_BASED_OUTPATIENT_CLINIC_OR_DEPARTMENT_OTHER)
Admission: RE | Disposition: A | Discharge status: Home / Self Care | Payer: Self-pay | Source: Ambulatory Visit | Attending: Surgery

## 2012-06-22 DIAGNOSIS — C50319 Malignant neoplasm of lower-inner quadrant of unspecified female breast: Secondary | ICD-10-CM

## 2012-06-22 DIAGNOSIS — Z853 Personal history of malignant neoplasm of breast: Secondary | ICD-10-CM | POA: Insufficient documentation

## 2012-06-22 DIAGNOSIS — Z9010 Acquired absence of unspecified breast and nipple: Secondary | ICD-10-CM | POA: Insufficient documentation

## 2012-06-22 DIAGNOSIS — Z923 Personal history of irradiation: Secondary | ICD-10-CM | POA: Insufficient documentation

## 2012-06-22 DIAGNOSIS — Z452 Encounter for adjustment and management of vascular access device: Secondary | ICD-10-CM | POA: Insufficient documentation

## 2012-06-22 HISTORY — PX: PORT-A-CATH REMOVAL: SHX5289

## 2012-06-22 SURGERY — MINOR REMOVAL PORT-A-CATH
Anesthesia: LOCAL | Site: Chest | Laterality: Left | Wound class: Clean

## 2012-06-22 MED ORDER — LIDOCAINE-EPINEPHRINE (PF) 1 %-1:200000 IJ SOLN
INTRAMUSCULAR | Status: DC | PRN
  Administered 2012-06-22: 20 mL

## 2012-06-22 SURGICAL SUPPLY — 30 items
ADH SKN CLS APL DERMABOND .7 (GAUZE/BANDAGES/DRESSINGS) ×1
APL SKNCLS STERI-STRIP NONHPOA (GAUZE/BANDAGES/DRESSINGS)
BENZOIN TINCTURE PRP APPL 2/3 (GAUZE/BANDAGES/DRESSINGS) IMPLANT
BLADE SURG 15 STRL LF DISP TIS (BLADE) ×1 IMPLANT
BLADE SURG 15 STRL SS (BLADE) ×2
CHLORAPREP W/TINT 26ML (MISCELLANEOUS) ×2 IMPLANT
CLOTH BEACON ORANGE TIMEOUT ST (SAFETY) ×2 IMPLANT
DERMABOND ADVANCED (GAUZE/BANDAGES/DRESSINGS) ×1
DERMABOND ADVANCED .7 DNX12 (GAUZE/BANDAGES/DRESSINGS) IMPLANT
DRSG TEGADERM 4X4.75 (GAUZE/BANDAGES/DRESSINGS) IMPLANT
ELECT REM PT RETURN 9FT ADLT (ELECTROSURGICAL)
ELECTRODE REM PT RTRN 9FT ADLT (ELECTROSURGICAL) IMPLANT
GAUZE SPONGE 4X4 12PLY STRL LF (GAUZE/BANDAGES/DRESSINGS) IMPLANT
GAUZE SPONGE 4X4 16PLY XRAY LF (GAUZE/BANDAGES/DRESSINGS) IMPLANT
GLOVE BIO SURGEON STRL SZ7 (GLOVE) ×1 IMPLANT
GLOVE EUDERMIC 7 POWDERFREE (GLOVE) ×2 IMPLANT
MARKER SKIN DUAL TIP RULER LAB (MISCELLANEOUS) ×2 IMPLANT
NDL HYPO 25X1 1.5 SAFETY (NEEDLE) ×1 IMPLANT
NDL SAFETY ECLIPSE 18X1.5 (NEEDLE) ×1 IMPLANT
NEEDLE HYPO 18GX1.5 SHARP (NEEDLE) ×2
NEEDLE HYPO 25X1 1.5 SAFETY (NEEDLE) ×4 IMPLANT
PENCIL BUTTON HOLSTER BLD 10FT (ELECTRODE) IMPLANT
STRIP CLOSURE SKIN 1/2X4 (GAUZE/BANDAGES/DRESSINGS) IMPLANT
SUT MNCRL AB 4-0 PS2 18 (SUTURE) ×2 IMPLANT
SUT VIC AB 3-0 FS2 27 (SUTURE) IMPLANT
SUT VIC AB 4-0 BRD 54 (SUTURE) IMPLANT
SUT VIC AB 4-0 P-3 18XBRD (SUTURE) IMPLANT
SUT VIC AB 4-0 P3 18 (SUTURE)
SUT VIC AB 4-0 SH 18 (SUTURE) ×2 IMPLANT
SYR CONTROL 10ML LL (SYRINGE) ×3 IMPLANT

## 2012-06-22 NOTE — Op Note (Signed)
[name] 10/05/1950 [id number] 06/04/2012  Preoperative diagnosis: Un-Needed PAC  Postoperative diagnosis: Same  Procedure: Portacath Removal  Surgeon: Currie Paris, MD, FACS  Anesthesia:local 1% xylo with Epi, 15 cc total   Clinical History and Indications: The patient has finished her chemotherapy and no longer needs a port. She wishes to have it removed.  Procedure: The patient was seen in the preoperative area and we confirmed the plans for the procedure as noted above. The Port-A-Cath site was identified and marked. The patient had no further questions.  The patient was then taken into the procedure room. The timeout was done. The area over the Port-A-Cath was anesthetized with 1% Xylocaine with epinephrine. I waited about 10 minutes and then the area was prepped and draped.  The old scar was opened. The capsule around the port opened and the port identified. The holding sutures were cut. The catheter was backed partially out of its tract. A figure 8 3-0 Vicryl suture was placed, the tubing removed, and the suture tied down to prevent backbleeding.  The port was then removed from its pocket. I made sure everything was dry. The incision was closed with 3-0 Vicryl,  and Dermabond.  The patient tolerated the procedure well. There were no complications.  Currie Paris, MD, FACS 06/22/2012 12:26 PM

## 2012-06-22 NOTE — Interval H&P Note (Signed)
History and Physical Interval Note:  06/22/2012 11:57 AM  [name]  has presented today for surgery, with the diagnosis of un needed port   The various methods of treatment have been discussed with the patient and family. After consideration of risks, benefits and other options for treatment, the patient has consented to  Procedure(s) (LRB) with comments: MINOR REMOVAL PORT-A-CATH (N/A) as a surgical intervention .  The patient's history has been reviewed, patient examined, no change in status, stable for surgery.  I have reviewed the patient's chart and labs.  Questions were answered to the patient's satisfaction.     Sully Dyment J

## 2012-06-22 NOTE — H&P (View-Only) (Signed)
NAME: [name]       DOB: 06-05-1950           DATE: 06/01/2012       MRN: [id number]   [name] is a 62 y.o.Mar[place] Kitchenfemale who presents for routine followup of her Right breast cancer, IDC, Stage IIB, receptor +, Her2 neg diagnosed in 2013 and treated with Mastectomy and now finished radiation She has no problems or concerns on either side.She has right arm lymphedema, but this is stable with a sleeve. She wants to get her port out  PFSH: She has had no significant changes since the last visit here.  ROS: There have been no significant changes since the last visit here  EXAM:  VS: BP 132/82  Pulse 78  Resp 18  Ht 5\' 2"  (1.575 m)  Wt 286 lb (129.729 kg)  BMI 52.31 kg/m2   General: The patient is alert, oriented, generally healthy appearing, NAD. Mood and affect are normal.  Breasts:  The right breast is surgically absent. No issues; left is wnl Port-A-Cath appears to be intact with no issues.  Lymphatics: She has no axillary or supraclavicular adenopathy on either side.  Extremities: Full ROM of the surgical side with  Moderate lymphedema noted.  Data Reviewed: Notes on radiation, bone density was normal   Impression: Doing well, with no evidence of recurrent cancer or new cancer  Plan: She'll come back in six months. We will check with Dr Royanne Foots if OK will schedule port removal

## 2012-06-23 ENCOUNTER — Telehealth (INDEPENDENT_AMBULATORY_CARE_PROVIDER_SITE_OTHER): Payer: Self-pay | Admitting: General Surgery

## 2012-06-23 ENCOUNTER — Encounter (HOSPITAL_BASED_OUTPATIENT_CLINIC_OR_DEPARTMENT_OTHER): Payer: Self-pay | Admitting: Surgery

## 2012-06-23 NOTE — Telephone Encounter (Signed)
Left message for po f/u appt  for 07/13/12 @1 :40 pm

## 2012-07-05 ENCOUNTER — Ambulatory Visit
Admission: RE | Admit: 2012-07-05 | Discharge: 2012-07-05 | Disposition: A | Discharge status: Home / Self Care | Source: Ambulatory Visit | Attending: Obstetrics and Gynecology | Admitting: Obstetrics and Gynecology

## 2012-07-05 ENCOUNTER — Other Ambulatory Visit: Payer: Self-pay | Admitting: Obstetrics and Gynecology

## 2012-07-05 DIAGNOSIS — Z9011 Acquired absence of right breast and nipple: Secondary | ICD-10-CM

## 2012-07-05 DIAGNOSIS — Z853 Personal history of malignant neoplasm of breast: Secondary | ICD-10-CM

## 2012-07-13 ENCOUNTER — Ambulatory Visit (INDEPENDENT_AMBULATORY_CARE_PROVIDER_SITE_OTHER): Payer: Medicare Other | Admitting: Surgery

## 2012-07-13 ENCOUNTER — Encounter (INDEPENDENT_AMBULATORY_CARE_PROVIDER_SITE_OTHER): Payer: Self-pay | Admitting: Surgery

## 2012-07-13 VITALS — BP 130/82 | HR 74 | Temp 98.4°F | Resp 16 | Ht 62.0 in | Wt 293.4 lb

## 2012-07-13 DIAGNOSIS — Z09 Encounter for follow-up examination after completed treatment for conditions other than malignant neoplasm: Secondary | ICD-10-CM

## 2012-07-13 NOTE — Progress Notes (Signed)
NAME: [name]                                            DOB: Mar 31, 1951 DATE: 07/13/2012                                                  MRN: [id number]  CC:  Chief Complaint  Patient presents with  . Routine Post Op    PAC remnoved 06/22/12    HPI: This patient comes in for post op follow-up .Sheunderwent PAC removal  on 06/22/12. She feels that she is doing well.  PE:  VITAL SIGNS: BP 130/82  Pulse 74  Temp(Src) 98.4 F (36.9 C) (Temporal)  Resp 16  Ht 5\' 2"  (1.575 m)  Wt 293 lb 6.4 oz (133.085 kg)  BMI 53.65 kg/m2  General: The patient appears to be healthy, NAD Incision healing nicely  DATA REVIEWED: No new  IMPRESSION: The patient is doing well S/P Port removal.    PLAN: RTC six months for breast cancer follow up

## 2012-07-13 NOTE — Patient Instructions (Signed)
See me again in August

## 2012-11-17 ENCOUNTER — Encounter (INDEPENDENT_AMBULATORY_CARE_PROVIDER_SITE_OTHER): Payer: Self-pay | Admitting: Surgery

## 2012-11-22 ENCOUNTER — Encounter: Payer: Self-pay | Admitting: Pulmonary Disease

## 2012-11-22 ENCOUNTER — Ambulatory Visit (INDEPENDENT_AMBULATORY_CARE_PROVIDER_SITE_OTHER): Payer: Medicare Other | Admitting: Pulmonary Disease

## 2012-11-22 VITALS — BP 110/70 | HR 74 | Temp 98.5°F | Ht 62.0 in | Wt 290.4 lb

## 2012-11-22 DIAGNOSIS — G4733 Obstructive sleep apnea (adult) (pediatric): Secondary | ICD-10-CM

## 2012-11-22 NOTE — Assessment & Plan Note (Signed)
The patient is doing very well on her current CPAP setup.  She is wearing the device compliantly by her history, and feels that she is sleeping well with adequate daytime alertness.  I have asked her to keep up with her mask changes and supplies, and to work more aggressively on weight loss.  She has lost 10 pounds since last visit.

## 2012-11-22 NOTE — Patient Instructions (Addendum)
Continue with cpap, and keep up with mask changes and supplies. Continue to work on weight loss. followup with me in one year if doing well.

## 2012-11-22 NOTE — Progress Notes (Signed)
  Subjective:    Patient ID: [name], female    DOB: 05-Jul-1950, 62 y.o.   MRN: [id number]  HPI The patient comes in today for followup of her obstructive sleep apnea.  She is wearing CPAP compliantly, and is having no issues with the mask fit or pressure.  She feels that she sleeps well with the device, and denies any issues with daytime sleepiness.  She has lost 10 pounds since the last visit here.   Review of Systems  Constitutional: Negative for fever and unexpected weight change.  HENT: Negative for ear pain, nosebleeds, congestion, sore throat, rhinorrhea, sneezing, trouble swallowing, dental problem, postnasal drip and sinus pressure.   Eyes: Negative for redness and itching.  Respiratory: Positive for shortness of breath. Negative for cough, chest tightness and wheezing.   Cardiovascular: Negative for palpitations and leg swelling.  Gastrointestinal: Negative for nausea and vomiting.  Genitourinary: Negative for dysuria.  Musculoskeletal: Negative for joint swelling.  Skin: Negative for rash.  Neurological: Negative for headaches.  Hematological: Does not bruise/bleed easily.  Psychiatric/Behavioral: Negative for dysphoric mood. The patient is not nervous/anxious.        Objective:   Physical Exam Morbidly obese female in no acute distress Nose without purulence or discharge noted No skin breakdown or pressure necrosis from a CPAP mask Neck without lymphadenopathy or thyromegaly Lower extremities with edema noted, no cyanosis Alert, does not appear to be sleepy, moves all 4 extremities.       Assessment & Plan:

## 2012-12-16 ENCOUNTER — Telehealth: Payer: Self-pay | Admitting: Oncology

## 2012-12-16 NOTE — Telephone Encounter (Signed)
Due to [facility] 7/28 lb/KK moved to [facility] per KK. lmonvm for pt re change w/new time for lb/JH 7/28 @ 9am.

## 2012-12-20 ENCOUNTER — Telehealth: Payer: Self-pay | Admitting: *Deleted

## 2012-12-20 ENCOUNTER — Ambulatory Visit (HOSPITAL_BASED_OUTPATIENT_CLINIC_OR_DEPARTMENT_OTHER): Payer: Medicare Other | Admitting: Family

## 2012-12-20 ENCOUNTER — Encounter: Payer: Self-pay | Admitting: Family

## 2012-12-20 ENCOUNTER — Other Ambulatory Visit (HOSPITAL_BASED_OUTPATIENT_CLINIC_OR_DEPARTMENT_OTHER): Payer: Medicare Other | Admitting: Lab

## 2012-12-20 VITALS — BP 162/86 | HR 77 | Temp 97.8°F | Resp 20 | Ht 62.0 in | Wt 291.8 lb

## 2012-12-20 DIAGNOSIS — C50319 Malignant neoplasm of lower-inner quadrant of unspecified female breast: Secondary | ICD-10-CM

## 2012-12-20 DIAGNOSIS — Z170 Estrogen receptor positive status [ER+]: Secondary | ICD-10-CM

## 2012-12-20 DIAGNOSIS — I890 Lymphedema, not elsewhere classified: Secondary | ICD-10-CM

## 2012-12-20 DIAGNOSIS — Z853 Personal history of malignant neoplasm of breast: Secondary | ICD-10-CM

## 2012-12-20 DIAGNOSIS — C50311 Malignant neoplasm of lower-inner quadrant of right female breast: Secondary | ICD-10-CM

## 2012-12-20 LAB — CBC WITH DIFFERENTIAL/PLATELET
Basophils Absolute: 0 10*3/uL (ref 0.0–0.1)
EOS%: 3.8 % (ref 0.0–7.0)
HGB: 11.8 g/dL (ref 11.6–15.9)
MCH: 28.7 pg (ref 25.1–34.0)
MCV: 89.8 fL (ref 79.5–101.0)
MONO%: 6.5 % (ref 0.0–14.0)
NEUT%: 57 % (ref 38.4–76.8)
RDW: 14.2 % (ref 11.2–14.5)

## 2012-12-20 LAB — COMPREHENSIVE METABOLIC PANEL (CC13)
Alkaline Phosphatase: 83 U/L (ref 40–150)
BUN: 17.1 mg/dL (ref 7.0–26.0)
Glucose: 104 mg/dl (ref 70–140)
Sodium: 145 mEq/L (ref 136–145)
Total Bilirubin: 1.03 mg/dL (ref 0.20–1.20)

## 2012-12-20 MED ORDER — ANASTROZOLE 1 MG PO TABS: 1.0000 mg | ORAL_TABLET | Freq: Every day | ORAL | Status: DC

## 2012-12-20 NOTE — Patient Instructions (Addendum)
Please contact us at [phone] if you have any questions or concerns.  Please continue to do well and enjoy life!!!  Get plenty of rest, drink plenty of water, exercise daily (walking), eat a balanced diet.  Vitamin D3 1000 IUs daily.   Peridin-C - for hot flashes and night sweats.  Potassium Rich Foods List - Foods High in Potassium  If you have high blood pressure, it is recommended to maintain a diet high in potassium rich food.  Try to include as many potassium rich foods in your diet as as possible, some foods high in potassium are various fruits, vegetables, dairy foods, and fish.        List of Foods High in Potassium: Foods with Potassium  Serving Size Potassium (mg)  Apricots, dried 10 halves 407  Avocados, raw 1 ounce 180  Bananas, raw 1 cup 594  Beets, cooked 1 cup 519  Brussel sprouts, cooked 1 cup 504  Cantaloupe 1 cup 494  Dates, dry 5 dates 271  Figs, dry 2 figs 271  Kiwi fruit, raw 1 medium 252  Lima beans 1 cup 955  Melons, honeydew 1 cup 461  Milk, fat free or skim 1 cup 407  Nectarines 1 nectarine 288  Orange juice 1 cup 496  Oranges 1 orange 237  Pears (fresh) 1 pear 208  Peanuts dry roasted, unsalted 1 ounce 187  Potatoes, baked, 1 potato 1081  Prune juice 1 cup 707  Prunes, dried 1 cup 828  Raisins 1 cup 1089  Spinach, cooked 1 cup 839  Tomato products, canned sauce 1 cup 909  Winter squash 1 cup 896  Yogurt plain, skim milk 8 ounces 579  USDA Nutrient Database for Standard References, Release 15 for Potassium, K (mg)  Results for orders placed in visit on 12/20/12 (from the past 24 hour(s))  CBC WITH DIFFERENTIAL     Status: None   Collection Time    12/20/12  9:13 AM      Result Value Range   WBC 5.0  3.9 - 10.3 10e3/uL   NEUT# 2.8  1.5 - 6.5 10e3/uL   HGB 11.8  11.6 - 15.9 g/dL   HCT 96.0  45.4 - 09.8 %   Platelets 181  145 - 400 10e3/uL   MCV 89.8  79.5 - 101.0 fL   MCH 28.7  25.1 - 34.0 pg   MCHC 32.0  31.5 - 36.0 g/dL   RBC 1.19   1.47 - 8.29 10e6/uL   RDW 14.2  11.2 - 14.5 %   lymph# 1.6  0.9 - 3.3 10e3/uL   MONO# 0.3  0.1 - 0.9 10e3/uL   Eosinophils Absolute 0.2  0.0 - 0.5 10e3/uL   Basophils Absolute 0.0  0.0 - 0.1 10e3/uL   NEUT% 57.0  38.4 - 76.8 %   LYMPH% 32.5  14.0 - 49.7 %   MONO% 6.5  0.0 - 14.0 %   EOS% 3.8  0.0 - 7.0 %   BASO% 0.2  0.0 - 2.0 %   nRBC 0  0 - 0 %   Narrative:    Performed At:  [facility]               [address]               [name], [place] [address]  COMPREHENSIVE METABOLIC PANEL (CC13)     Status: None   Collection Time    12/20/12  9:14 AM  Result Value Range   Sodium 145  136 - 145 mEq/L   Potassium 3.6  3.5 - 5.1 mEq/L   Chloride 104  98 - 109 mEq/L   CO2 29  22 - 29 mEq/L   Glucose 104  70 - 140 mg/dl   BUN 16.1  7.0 - 09.6 mg/dL   Creatinine 1.0  0.6 - 1.1 mg/dL   Total Bilirubin 0.45  0.20 - 1.20 mg/dL   Alkaline Phosphatase 83  40 - 150 U/L   AST 14  5 - 34 U/L   ALT 10  0 - 55 U/L   Total Protein 7.7  6.4 - 8.3 g/dL   Albumin 3.8  3.5 - 5.0 g/dL   Calcium 40.9  8.4 - 81.1 mg/dL   Narrative:    Note: New Reference ranges.Performed At:  [facility]               [address]               [place], [place] [address]

## 2012-12-20 NOTE — Progress Notes (Signed)
[facility]  [phone] [phone]  OFFICE PROGRESS NOTE   PCP:  Delorse Lek, M.D. SU: Cyndia Bent, M.D. RAD ONC: Chipper Herb, M.D.   DIAGNOSIS: Mrs. 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   PRIOR THERAPY: #1 The patient was originally seen at the multidisciplinary breast clinic on 08/05/2011 for a stage IIB, invasive ductal carcinoma (T3 NX MX).   #2  Status post right simple mastectomy with right axillary lymph node resection on 08/14/2011 with the final pathology revealing invasive and in situ ductal carcinoma. Two nodules were seen 3 cm and 2.2 cm.  Margins were negative.  There was focal lymphovascular space involvement by tumor. 1/15 sentinel lymph nodes were positive for metastatic disease.  The tumor was estrogen receptor positive 100%,  progesterone receptor positive 100%,  and HER-2/neu negative-no amplification by CISH with a ratio of 1.09, proliferation marker 53%. Final pathologic staging was stage IIB, pT2 pN1a, pMX.    #3.  Adjuvant chemotherapy with Taxotere/Cytoxan  Q3 weeks beginning on 09/22/2011.  The patient is a participant in NSBP B-49 protocol.  She completed total of 6 cycles of Taxotere/Cytoxan given from 09/22/2011 -  01/05/2012.  #4   Status post radiation therapy to the right chest wall and axilla completed on 03/31/2012.  #5  She began adjuvant antiestrogen therapy with Arimidex 1 mg by mouth daily in 03/2012.  A total of 5 years of therapy is planned.  #6  The patient's last bone density scan in 03/2012 showed a T score of 1.1 (normal).   CURRENT THERAPY: Arimidex 1 mg daily starting in 03/2012.   INTERVAL HISTORY:   Mrs.Shaneika A Asbury 62 y.o. female was seen today for followup of right breast invasive and in situ ductal carcinoma.  She is accompanied by her husband Dayton Scrape for today's  office visit.  She was last seen by [name] on 06/21/2012.  Since her last office visit she reports ongoing right upper extremity lymphedema, joint aches, hot flashes and night sweats.  Her interval history is significant for having her Port-A-Cath removed on 06/22/2012.  She states she did join the [employer] at the [facility] and participates in this physical activity a few times weekly.  Her interval history is otherwise unremarkable.   MEDICAL HISTORY: Past Medical History  Diagnosis Date  . Hypertension   . Asthma   . Hyperlipemia   . Heart murmur   . Arthritis   . OSA (obstructive sleep apnea)     CPAP settings- ?   . Blood transfusion     hx of last one in 1987  . Cancer of lower-inner quadrant of female breast 07/24/2011    right breast cancer /IDC,stage IIB,er/pr=+,her2=Neg  . MRSA (methicillin resistant staph aureus) culture positive     08/2011  . Breast cancer   . Lymphedema     right arm wears a sleeve  . History of chemotherapy     taxotere/cytoxan 6 cycles 09/22/11-01/05/12 day 2 neulasta  . Neuromuscular disorder     peripheral neuropathy hands feet    ALLERGIES:  is allergic to trandolapril-verapamil hcl er.   SURGICAL HISTORY:  Past Surgical History  Procedure Laterality Date  . Tubal ligation    . Dilation and curettage of uterus      x3  . Carpal tunnel release    . Hand surgery  2012  . Cholecystectomy  1987    open - Dr Wiliam Ke  . Mastectomy modified radical  08/14/11    right , ER/PR +, HER2 -  . Abdominal hysterectomy  2007    TAH/BSO  . Breast surgery      mastectomy RIGHT  . Port-a-cath insertion  09/11/11  . Portacath placement  09/11/2011    Procedure: INSERTION PORT-A-CATH;  Surgeon: Clovis Pu. Cornett, MD;  Location: [facility];  Service: General;  Laterality: N/A;  Insert of Port  . Port-a-cath removal  06/22/2012    Procedure: MINOR REMOVAL PORT-A-CATH;  Surgeon: Currie Paris, MD;  Location: [facility];  Service: General;   Laterality: Left;    REVIEW OF SYSTEMS:   A 10 point review of systems was completed and is negative except for right upper extremity lymphedema, joint aches, hot flashes and night sweats.  The patient asked for and was referred to lymphedema clinic for further lymphedema physical therapy and to be measured for a new compression sleeve and glove.  We will ask [facility] PT/rehabilitation cement the patient's compression sleeve/glove orders once her lymphedema measurements are completed.  PHYSICAL EXAMINATION:  BP 162/86  Pulse 77  Temp(Src) 97.8 F (36.6 C) (Oral)  Resp 20  Ht 5\' 2"  (1.575 m)  Wt 291 lb 12.8 oz (132.36 kg)  BMI 53.36 kg/m2  General appearance: Alert, cooperative, well nourished, obese, no apparent distress Head: Normocephalic, without obvious abnormality, atraumatic Eyes: Arcus senilis, PERRLA, EOMI Nose: Nares, septum and mucosa are normal, no drainage or sinus tenderness Neck: Supple, symmetrical, trachea midline, no tenderness, excessive habitus Resp: Clear to auscultation bilaterally Cardio: Regular rate and rhythm, S1, S2 normal, no murmur, click, rub or gallop Breasts:  Deferred GI: Soft, distended, non-tender, hypoactive bowel sounds, excessive habitus Extremities: Extremities normal, atraumatic, no cyanosis, visible right upper extremity lymphedema (patient currently wearing compression sleeve), bilateral lower extremity generalized edema Lymph nodes: Cervical and supraclavicular lymph nodes are normal Neurologic: Grossly normal  ECOG PERFORMANCE STATUS: 1 - Symptomatic but completely ambulatory  LABORATORY DATA: Results for orders placed in visit on 12/20/12 (from the past 48 hour(s))  CBC WITH DIFFERENTIAL     Status: None   Collection Time    12/20/12  9:13 AM      Result Value Range   WBC 5.0  3.9 - 10.3 10e3/uL   NEUT# 2.8  1.5 - 6.5 10e3/uL   HGB 11.8  11.6 - 15.9 g/dL   HCT 16.1  09.6 - 04.5 %   Platelets 181  145 - 400 10e3/uL   MCV 89.8  79.5 -  101.0 fL   MCH 28.7  25.1 - 34.0 pg   MCHC 32.0  31.5 - 36.0 g/dL   RBC 4.09  8.11 - 9.14 10e6/uL   RDW 14.2  11.2 - 14.5 %   lymph# 1.6  0.9 - 3.3 10e3/uL   MONO# 0.3  0.1 - 0.9 10e3/uL   Eosinophils Absolute 0.2  0.0 - 0.5 10e3/uL   Basophils Absolute 0.0  0.0 - 0.1 10e3/uL   NEUT% 57.0  38.4 - 76.8 %   LYMPH% 32.5  14.0 - 49.7 %   MONO% 6.5  0.0 - 14.0 %   EOS% 3.8  0.0 - 7.0 %   BASO% 0.2  0.0 - 2.0 %   nRBC 0  0 - 0 %  COMPREHENSIVE METABOLIC PANEL (CC13)     Status: None   Collection Time    12/20/12  9:14  AM      Result Value Range   Sodium 145  136 - 145 mEq/L   Potassium 3.6  3.5 - 5.1 mEq/L   Chloride 104  98 - 109 mEq/L   CO2 29  22 - 29 mEq/L   Glucose 104  70 - 140 mg/dl   BUN 16.1  7.0 - 09.6 mg/dL   Creatinine 1.0  0.6 - 1.1 mg/dL   Total Bilirubin 0.45  0.20 - 1.20 mg/dL   Alkaline Phosphatase 83  40 - 150 U/L   AST 14  5 - 34 U/L   ALT 10  0 - 55 U/L   Total Protein 7.7  6.4 - 8.3 g/dL   Albumin 3.8  3.5 - 5.0 g/dL   Calcium 40.9  8.4 - 81.1 mg/dL     ASSESSMENT: Mrs. Babin is a 62 year old female:  #1 Status post right simple mastectomy with right axillary lymph node resection on 08/14/2011 with the final pathology revealing invasive and in situ ductal carcinoma.  Two nodules were seen 3 cm and 2.2 cm.  Margins were negative.  There was focal lymphovascular space involvement by tumor. 1/15 sentinel lymph nodes were positive for metastatic disease.  The tumor was estrogen receptor positive 100%,  progesterone receptor positive 100%,  and HER-2/neu negative-no amplification by CISH with a ratio of 1.09, proliferation marker 53%. Final pathologic staging was stage IIB, pT2 pN1a, pMX.    #2 The patient was participant in clinical trial NSABP B-49 utilizing Taxotere/Cytoxan x 6 cycles versus an anthracycline. Patient was randomized to Taxotere/Cytoxan every 3 weeks for a total of 6 cycles.  Her research nurse was Isidoro Donning, Charity fundraiser.   #3  She completed total of 6  cycles of Taxotere/Cytoxan given from 09/22/2011 -  01/05/2012.  Overall she tolerated her chemotherapy well without any problems.  #4 Status post radiation therapy to the right chest wall and axilla completed on 03/31/2012.  #5  She began adjuvant antiestrogen therapy with Arimidex 1 mg by mouth daily in 03/2012.  A total of 5 years of therapy is planned.  #6 Continued right upper extremity lymphedema.  She has previously been seen by the lymphedema clinic.   #7 Hot flashes/night sweats/joint aches.    PLAN:  #1 The patient will continue antiestrogen therapy with Arimidex 1 mg daily.  Antiestrogen therapy planned to continue until 03/2017.  She was provided with a written prescription for Arimidex 1 mg by mouth daily #90 with 2 refills once her current prescription runs out.  #2 She will be referred back to [facility] lymphedema/rehabilitation clinic for right upper extremity lymphedema and compression garment measurement/ordering.  #3  The patient was asked to try over-the-counter Peridin-C for hot flashes and night sweats.  She will continue to be active with the [facility] program which could help with her joint aches.  Mrs. Christenbury's potassium level is on the lower side of normal (which may also be contributing to joint aches), therefore she was provided a list of potassium rich foods.  #4 We will schedule her unilateral left digital screening mammogram for her in 06/2013.  She will be due for another bone density scan in 03/2014.  #5 We plan to see Mrs. Stillson again in 06/2013 after her annual mammogram.  Laboratories of CBC, CMP, LDH, and vitamin D will be checked at that time.  All questions were answered.  Mrs. Hain encouraged to contact us in the interim if she has any problems, questions or concerns.  Larina Bras, NP-C 12/20/2012, 4:39 PM

## 2012-12-20 NOTE — Telephone Encounter (Signed)
appts made and printed...td 

## 2012-12-27 ENCOUNTER — Ambulatory Visit (INDEPENDENT_AMBULATORY_CARE_PROVIDER_SITE_OTHER): Payer: Medicare Other | Admitting: Surgery

## 2012-12-29 ENCOUNTER — Other Ambulatory Visit: Payer: Self-pay | Admitting: Family

## 2012-12-29 DIAGNOSIS — Z1231 Encounter for screening mammogram for malignant neoplasm of breast: Secondary | ICD-10-CM

## 2013-01-03 ENCOUNTER — Ambulatory Visit: Payer: Medicare Other | Attending: Family | Admitting: Physical Therapy

## 2013-01-03 DIAGNOSIS — I890 Lymphedema, not elsewhere classified: Secondary | ICD-10-CM | POA: Insufficient documentation

## 2013-01-03 DIAGNOSIS — M24519 Contracture, unspecified shoulder: Secondary | ICD-10-CM | POA: Insufficient documentation

## 2013-01-03 DIAGNOSIS — C50919 Malignant neoplasm of unspecified site of unspecified female breast: Secondary | ICD-10-CM | POA: Insufficient documentation

## 2013-01-03 DIAGNOSIS — Z9010 Acquired absence of unspecified breast and nipple: Secondary | ICD-10-CM | POA: Insufficient documentation

## 2013-01-03 DIAGNOSIS — IMO0001 Reserved for inherently not codable concepts without codable children: Secondary | ICD-10-CM | POA: Insufficient documentation

## 2013-01-05 ENCOUNTER — Ambulatory Visit: Payer: Medicare Other | Admitting: Physical Therapy

## 2013-01-11 ENCOUNTER — Ambulatory Visit (INDEPENDENT_AMBULATORY_CARE_PROVIDER_SITE_OTHER): Payer: Medicare Other | Admitting: Surgery

## 2013-01-11 ENCOUNTER — Encounter (INDEPENDENT_AMBULATORY_CARE_PROVIDER_SITE_OTHER): Payer: Self-pay | Admitting: Surgery

## 2013-01-11 VITALS — BP 160/88 | HR 82 | Resp 18 | Ht 62.0 in | Wt 289.8 lb

## 2013-01-11 DIAGNOSIS — Z853 Personal history of malignant neoplasm of breast: Secondary | ICD-10-CM

## 2013-01-11 NOTE — Progress Notes (Signed)
NAME: [name]       DOB: Apr 13, 1951           DATE: 01/11/2013       MRN: [id number]   [name] is a 62 y.o.Marland Kitchenfemale who presents for routine followup of her Right breast cancer, IDC, Stage IIB, receptor +, Her2 neg diagnosed in 2013 and treated with Mastectomy andradiation She has no problems or concerns on either side.She has right arm lymphedema, but this is stable with a sleeve.PFSH: She has had no significant changes since the last visit here.  ROS: There have been no significant changes since the last visit here  EXAM:  VS: BP 160/88  Pulse 82  Resp 18  Ht 5\' 2"  (1.575 m)  Wt 289 lb 12.8 oz (131.452 kg)  BMI 52.99 kg/m2   General: The patient is alert, oriented, generally healthy appearing, NAD. Mood and affect are normal.  Breasts:  The right breast is surgically absent. No issues; left is wnl   Lymphatics: She has no axillary or supraclavicular adenopathy on either side.  Extremities: Full ROM of the surgical side with  Moderate lymphedema noted.  Data Reviewed: Mammogram in Feb 2014 *RADIOLOGY REPORT*  Clinical Data: Screening.  MAMMOGRAPHIC UNILATERAL LEFT DIGITAL SCREENING WITH CAD  Comparison: Previous exams.  FINDINGS:  ACR Breast Density Category 2: There is a scattered fibroglandular  pattern.  No suspicious masses, architectural distortion, or calcifications  are present.  Images were processed with CAD.  IMPRESSION:  No mammographic evidence of malignancy.  A result letter of this screening mammogram will be mailed directly  to the patient.  RECOMMENDATION:  Screening mammogram in one year. (Code:SM-B-01Y)  BI-RADS CATEGORY 1: Negative.  Original Report Authenticated By: Christiana Pellant, M.D.    Impression: Doing well, with no evidence of recurrent cancer or new cancer  Plan: RTC one year. Continue annual mammograms

## 2013-01-11 NOTE — Patient Instructions (Signed)
Continue your annual mammograms and see [name] in one year

## 2013-01-18 ENCOUNTER — Ambulatory Visit: Payer: Medicare Other

## 2013-01-20 ENCOUNTER — Ambulatory Visit: Payer: Medicare Other | Admitting: Physical Therapy

## 2013-01-25 ENCOUNTER — Ambulatory Visit: Payer: Medicare Other | Attending: Family | Admitting: Physical Therapy

## 2013-01-25 DIAGNOSIS — C50919 Malignant neoplasm of unspecified site of unspecified female breast: Secondary | ICD-10-CM | POA: Insufficient documentation

## 2013-01-25 DIAGNOSIS — M24519 Contracture, unspecified shoulder: Secondary | ICD-10-CM | POA: Insufficient documentation

## 2013-01-25 DIAGNOSIS — IMO0001 Reserved for inherently not codable concepts without codable children: Secondary | ICD-10-CM | POA: Insufficient documentation

## 2013-01-25 DIAGNOSIS — I890 Lymphedema, not elsewhere classified: Secondary | ICD-10-CM | POA: Insufficient documentation

## 2013-01-25 DIAGNOSIS — Z9010 Acquired absence of unspecified breast and nipple: Secondary | ICD-10-CM | POA: Insufficient documentation

## 2013-01-28 ENCOUNTER — Ambulatory Visit: Payer: Medicare Other | Admitting: Physical Therapy

## 2013-03-30 ENCOUNTER — Other Ambulatory Visit: Payer: Self-pay | Admitting: Neurosurgery

## 2013-03-30 DIAGNOSIS — D497 Neoplasm of unspecified behavior of endocrine glands and other parts of nervous system: Secondary | ICD-10-CM

## 2013-03-31 ENCOUNTER — Other Ambulatory Visit: Payer: Self-pay | Admitting: Gastroenterology

## 2013-04-08 ENCOUNTER — Ambulatory Visit
Admission: RE | Admit: 2013-04-08 | Discharge: 2013-04-08 | Disposition: A | Discharge status: Home / Self Care | Payer: Medicare Other | Source: Ambulatory Visit | Attending: Neurosurgery | Admitting: Neurosurgery

## 2013-04-08 DIAGNOSIS — D497 Neoplasm of unspecified behavior of endocrine glands and other parts of nervous system: Secondary | ICD-10-CM

## 2013-04-08 MED ORDER — GADOBENATE DIMEGLUMINE 529 MG/ML IV SOLN
13.0000 mL | Freq: Once | INTRAVENOUS | Status: AC | PRN
  Administered 2013-04-08: 13 mL via INTRAVENOUS

## 2013-05-23 ENCOUNTER — Encounter (HOSPITAL_COMMUNITY): Payer: Self-pay | Admitting: Pharmacy Technician

## 2013-05-25 ENCOUNTER — Encounter (HOSPITAL_COMMUNITY): Payer: Self-pay | Admitting: *Deleted

## 2013-05-25 DIAGNOSIS — E237 Disorder of pituitary gland, unspecified: Secondary | ICD-10-CM

## 2013-05-25 HISTORY — DX: Disorder of pituitary gland, unspecified: E23.7

## 2013-05-27 ENCOUNTER — Other Ambulatory Visit: Payer: Self-pay | Admitting: Oncology

## 2013-06-13 ENCOUNTER — Ambulatory Visit (HOSPITAL_COMMUNITY)
Admission: RE | Admit: 2013-06-13 | Discharge: 2013-06-13 | Disposition: A | Discharge status: Home / Self Care | Payer: Medicare Other | Source: Ambulatory Visit | Attending: Gastroenterology | Admitting: Gastroenterology

## 2013-06-13 ENCOUNTER — Ambulatory Visit (HOSPITAL_COMMUNITY): Payer: Medicare Other | Admitting: Certified Registered Nurse Anesthetist

## 2013-06-13 ENCOUNTER — Encounter (HOSPITAL_COMMUNITY)
Admission: RE | Disposition: A | Discharge status: Home / Self Care | Payer: Self-pay | Source: Ambulatory Visit | Attending: Gastroenterology

## 2013-06-13 ENCOUNTER — Encounter (HOSPITAL_COMMUNITY): Payer: Medicare Other | Admitting: Certified Registered Nurse Anesthetist

## 2013-06-13 ENCOUNTER — Encounter (HOSPITAL_COMMUNITY): Payer: Self-pay

## 2013-06-13 DIAGNOSIS — G4733 Obstructive sleep apnea (adult) (pediatric): Secondary | ICD-10-CM | POA: Insufficient documentation

## 2013-06-13 DIAGNOSIS — Z853 Personal history of malignant neoplasm of breast: Secondary | ICD-10-CM | POA: Insufficient documentation

## 2013-06-13 DIAGNOSIS — K5900 Constipation, unspecified: Secondary | ICD-10-CM | POA: Insufficient documentation

## 2013-06-13 DIAGNOSIS — E785 Hyperlipidemia, unspecified: Secondary | ICD-10-CM | POA: Insufficient documentation

## 2013-06-13 DIAGNOSIS — I10 Essential (primary) hypertension: Secondary | ICD-10-CM | POA: Insufficient documentation

## 2013-06-13 DIAGNOSIS — J45909 Unspecified asthma, uncomplicated: Secondary | ICD-10-CM | POA: Insufficient documentation

## 2013-06-13 DIAGNOSIS — Z860100 Personal history of colon polyps, unspecified: Secondary | ICD-10-CM | POA: Insufficient documentation

## 2013-06-13 DIAGNOSIS — Z9089 Acquired absence of other organs: Secondary | ICD-10-CM | POA: Insufficient documentation

## 2013-06-13 DIAGNOSIS — Z79899 Other long term (current) drug therapy: Secondary | ICD-10-CM | POA: Insufficient documentation

## 2013-06-13 DIAGNOSIS — Z90710 Acquired absence of both cervix and uterus: Secondary | ICD-10-CM | POA: Insufficient documentation

## 2013-06-13 HISTORY — PX: COLONOSCOPY: SHX5424

## 2013-06-13 HISTORY — DX: Disorder of pituitary gland, unspecified: E23.7

## 2013-06-13 HISTORY — DX: Unspecified glaucoma: H40.9

## 2013-06-13 SURGERY — COLONOSCOPY
Anesthesia: Monitor Anesthesia Care

## 2013-06-13 MED ORDER — LIDOCAINE HCL (CARDIAC) 20 MG/ML IV SOLN
INTRAVENOUS | Status: AC
  Filled 2013-06-13: qty 5

## 2013-06-13 MED ORDER — SODIUM CHLORIDE 0.9 % IV SOLN: INTRAVENOUS | Status: DC

## 2013-06-13 MED ORDER — ONDANSETRON HCL 4 MG/2ML IJ SOLN
INTRAMUSCULAR | Status: DC | PRN
  Administered 2013-06-13: 4 mg via INTRAVENOUS

## 2013-06-13 MED ORDER — PROPOFOL 10 MG/ML IV EMUL
INTRAVENOUS | Status: DC | PRN
  Administered 2013-06-13 (×3): 40 mg via INTRAVENOUS

## 2013-06-13 MED ORDER — PROPOFOL 10 MG/ML IV BOLUS
INTRAVENOUS | Status: AC
  Filled 2013-06-13: qty 20

## 2013-06-13 MED ORDER — ONDANSETRON HCL 4 MG/2ML IJ SOLN
INTRAMUSCULAR | Status: AC
  Filled 2013-06-13: qty 2

## 2013-06-13 MED ORDER — LIDOCAINE HCL (CARDIAC) 20 MG/ML IV SOLN
INTRAVENOUS | Status: AC | PRN
  Administered 2013-06-13: 50 mg via INTRAVENOUS
  Administered 2017-06-02: 60 mg via INTRAVENOUS

## 2013-06-13 MED ORDER — PROPOFOL INFUSION 10 MG/ML OPTIME
INTRAVENOUS | Status: DC | PRN
  Administered 2013-06-13: 75 ug/kg/min via INTRAVENOUS

## 2013-06-13 MED ORDER — LACTATED RINGERS IV SOLN
INTRAVENOUS | Status: DC | PRN
  Administered 2013-06-13: 14:00:00 via INTRAVENOUS

## 2013-06-13 MED ORDER — LACTATED RINGERS IV SOLN
INTRAVENOUS | Status: DC
Start: 2013-06-13 — End: 2013-06-13
  Administered 2013-06-13: 13:00:00 via INTRAVENOUS

## 2013-06-13 NOTE — Anesthesia Postprocedure Evaluation (Signed)
  Anesthesia Post-op Note  Patient: [name]  Procedure(s) Performed: Procedure(s) (LRB): COLONOSCOPY (N/A)  Patient Location: PACU  Anesthesia Type: MAC  Level of Consciousness: awake and alert   Airway and Oxygen Therapy: Patient Spontanous Breathing  Post-op Pain: mild  Post-op Assessment: Post-op Vital signs reviewed, Patient's Cardiovascular Status Stable, Respiratory Function Stable, Patent Airway and No signs of Nausea or vomiting  Last Vitals:  Filed Vitals:   06/13/13 1431  BP: 140/83  Pulse:   Temp:   Resp: 10    Post-op Vital Signs: stable   Complications: No apparent anesthesia complications

## 2013-06-13 NOTE — Preoperative (Signed)
Beta Blockers   Reason not to administer Beta Blockers:Not Applicable 

## 2013-06-13 NOTE — Anesthesia Preprocedure Evaluation (Signed)
Anesthesia Evaluation  Patient identified by MRN, date of birth, ID band Patient awake    Reviewed: Allergy & Precautions, H&P , NPO status , Patient's Chart, lab work & pertinent test results  Airway Mallampati: II TM Distance: <3 FB Neck ROM: Full    Dental no notable dental hx.    Pulmonary asthma , sleep apnea and Continuous Positive Airway Pressure Ventilation ,  breath sounds clear to auscultation  + decreased breath sounds      Cardiovascular hypertension, Pt. on medications Rhythm:Regular Rate:Normal     Neuro/Psych negative neurological ROS  negative psych ROS   GI/Hepatic negative GI ROS, Neg liver ROS,   Endo/Other  Morbid obesity  Renal/GU negative Renal ROS  negative genitourinary   Musculoskeletal negative musculoskeletal ROS (+)   Abdominal   Peds negative pediatric ROS (+)  Hematology negative hematology ROS (+)   Anesthesia Other Findings   Reproductive/Obstetrics negative OB ROS                           Anesthesia Physical Anesthesia Plan  ASA: III  Anesthesia Plan: General and MAC   Post-op Pain Management:    Induction: Intravenous  Airway Management Planned: Simple Face Mask  Additional Equipment:   Intra-op Plan:   Post-operative Plan:   Informed Consent: I have reviewed the patients History and Physical, chart, labs and discussed the procedure including the risks, benefits and alternatives for the proposed anesthesia with the patient or authorized representative who has indicated his/her understanding and acceptance.   Dental advisory given  Plan Discussed with: CRNA and Surgeon  Anesthesia Plan Comments:         Anesthesia Quick Evaluation

## 2013-06-13 NOTE — H&P (Addendum)
[name] is an 63 y.o. female.   Chief Complaint: Colorectal cancer screening. HPI: Patient is here for a colonoscopy. She has had adenomatous polyps removed in the past. She denies a history of hematochezia or melena bt suffers from constipation intermittently. See office notes for further details.  Past Medical History  Diagnosis Date  . Hypertension   . Asthma   . Hyperlipemia   . Heart murmur   . Arthritis   . OSA (obstructive sleep apnea)     CPAP settings- ?   . Blood transfusion     hx of last one in 1987  . Cancer of lower-inner quadrant of female breast 07/24/2011    right breast cancer /IDC,stage IIB,er/pr=+,her2=Neg  . MRSA (methicillin resistant staph aureus) culture positive     08/2011  . Breast cancer   . Lymphedema     right arm wears a sleeve  . History of chemotherapy     taxotere/cytoxan 6 cycles 09/22/11-01/05/12 day 2 neulasta  . Neuromuscular disorder     peripheral neuropathy hands feet  . 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 Christella Noa follows  . Glaucoma    Past Surgical History  Procedure Laterality Date  . Tubal ligation    . Dilation and curettage of uterus      x3  . Carpal tunnel release Right     2012  . Cholecystectomy  1987    open - Dr Lindon Romp  . Mastectomy modified radical  08/14/11    right , ER/PR +, HER2 -  . Abdominal hysterectomy  2007    TAH/BSO  . Breast surgery      mastectomy RIGHT  . Portacath placement  09/11/2011    Procedure: INSERTION PORT-A-CATH;  Surgeon: Joyice Faster. Cornett, MD;  Location: [facility];  Service: General;  Laterality: N/A;  Insert of Port  . Port-a-cath removal  06/22/2012    Procedure: MINOR REMOVAL PORT-A-CATH;  Surgeon: Haywood Lasso, MD;  Location: [facility];  Service: General;  Laterality: Left;  Marland Kitchen Eye surgery      laser eye surgery for glaucoma  . Port-a-cath insertion      port-a-cath removal 1'14   Family History  Problem Relation Age of  Onset  . Cancer Father 90    prostate ca  . Arthritis Mother    Social History:  reports that she has never smoked. She has never used smokeless tobacco. She reports that she does not drink alcohol or use illicit drugs.  Allergies:  Allergies  Allergen Reactions  . Trandolapril-Verapamil Hcl Er      lip swelling   Medications Prior to Admission  Medication Sig Dispense Refill  . albuterol (PROAIR HFA) 108 (90 BASE) MCG/ACT inhaler Inhale 2 puffs into the lungs every 6 (six) hours as needed for wheezing or shortness of breath. For shortness of breath      . anastrozole (ARIMIDEX) 1 MG tablet Take 1 mg by mouth daily.      Marland Kitchen anastrozole (ARIMIDEX) 1 MG tablet TAKE 1 TABLET BY MOUTH DAILY.  90 tablet  3  . Cholecalciferol (VITAMIN D3) 1000 UNITS CAPS Take 1 capsule by mouth daily.      . cyanocobalamin 500 MCG tablet Take 500 mcg by mouth daily.      . dorzolamide (TRUSOPT) 2 % ophthalmic solution Place 1 drop into both eyes 3 (three) times daily.       . Fluticasone-Salmeterol (ADVAIR DISKUS) 100-50 MCG/DOSE AEPB  Inhale 1 puff into the lungs 2 (two) times daily.       . furosemide (LASIX) 20 MG tablet Take 20 mg by mouth every morning.       . latanoprost (XALATAN) 0.005 % ophthalmic solution Place 1 drop into both eyes at bedtime.       . simvastatin (ZOCOR) 20 MG tablet Take 20 mg by mouth daily with breakfast.       . valsartan-hydrochlorothiazide (DIOVAN-HCT) 160-25 MG per tablet Take 1 tablet by mouth daily with breakfast.       . cabergoline (DOSTINEX) 0.5 MG tablet Take 0.25 mg by mouth 2 (two) times a week.      . Multiple Vitamin (MULITIVITAMIN WITH MINERALS) TABS Take 1 tablet by mouth daily with breakfast.        Review of Systems  Constitutional: Negative.   HENT: Negative.   Eyes: Negative.   Respiratory: Negative.   Cardiovascular: Negative.   Gastrointestinal: Positive for constipation.  Musculoskeletal: Positive for back pain and joint pain.  Skin: Negative.    Psychiatric/Behavioral: Negative.    Blood pressure 153/79, pulse 68, temperature 98 F (36.7 C), temperature source Oral, resp. rate 22, height 5' 2"  (1.575 m), weight 125.193 kg (276 lb). Physical Exam  Constitutional: She is oriented to person, place, and time. She appears well-developed and well-nourished.  HENT:  Head: Normocephalic and atraumatic.  Eyes: Conjunctivae and EOM are normal. Pupils are equal, round, and reactive to light.  Neck: Normal range of motion. Neck supple.  Cardiovascular: Normal rate and regular rhythm.   Respiratory: Effort normal and breath sounds normal.  GI: Soft. Bowel sounds are normal. She exhibits no distension. There is no tenderness. There is no rebound and no guarding.  Morbidly obese  Neurological: She is alert and oriented to person, place, and time.  Skin: Skin is warm and dry.    Assessment/Plan Colorectal cancer screening?personal history of adenomatous polyps: proceed with a colonoscopy at this time. Patient is being done in the hospital due to her BMI of 53 and her other co-morbidities.   Sparrow Siracusa 06/13/2013, 2:02 PM

## 2013-06-13 NOTE — Transfer of Care (Signed)
Immediate Anesthesia Transfer of Care Note  Patient: [name]  Procedure(s) Performed: Procedure(s) (LRB): COLONOSCOPY (N/A)  Patient Location: PACU  Anesthesia Type: MAC  Level of Consciousness: sedated, patient cooperative and responds to stimulation  Airway & Oxygen Therapy: Patient Spontanous Breathing and Patient connected to face mask oxgen  Post-op Assessment: Report given to PACU RN and Post -op Vital signs reviewed and stable  Post vital signs: Reviewed and stable  Complications: No apparent anesthesia complications

## 2013-06-13 NOTE — Op Note (Signed)
[facility] [place], [address]   OPERATIVE PROCEDURE REPORT  PATIENT: Kathryn, [name]  MR#: [id number] BIRTHDATE: 01/31/1951 GENDER: Female ENDOSCOPIST: Dr.  Edmonia James, MD ASSISTANT:   William Dalton, technician Elna Breslow, RN PROCEDURE DATE: 06/13/2013 PRE-PROCEDURE PREPARATION: The patient was prepped with a gallon of Golytely the night prior to the procedure.  The patient was fasted for 4 hours prior to the procedure.  PRE-PROCEDURE PHYSICAL: Patient has stable vital signs.  Neck is supple.  There is no JVD, thyromegaly or LAD.  Chest clear to auscultation.  S1 and S2 regular.  Abdomen soft, non-distended, non-tender with NABS. PROCEDURE:     Colonoscopy, diagnostic ASA CLASS:     Class III INDICATIONS:     1.  Constipation 2. Personal history of adenomatous polyps 3. Colorectal cancer screening. MEDICATIONS:     Diprivan 180 mg, Zofran 4 mg and Lidocaine 50 mg IV.  DESCRIPTION OF PROCEDURE: After the risks, benefits, and alternatives of the procedure were thoroughly explained [including a 10% missed rate of cancer and polyps], informed consent was obtained.  Digital rectal exam was performed.  The EC-[id number] ([id number])  was introduced through the anus  and advanced to the terminal ileum which was intubated for a short distance , limited by No adverse events experienced.   The quality of the prep was adequate. . Multiple washes were done. Small lesions could be missed. The instrument was then slowly withdrawn as the colon was fully examined.     COLON FINDINGS: A normal appearing cecum, ileocecal valve, and appendiceal orifice were identified.  The ascending, hepatic flexure, transverse, splenic flexure, descending, sigmoid colon and rectum appeared unremarkable.  No polyps or cancers were seen. The entire colonic mucosa appeared healthy with a normal vascular pattern.  No masses, polyps, diverticula or AVMs were noted.   The appendiceal orifice and the ICV were identified and photographed. The terminal ileum appeared normal.  Retroflexed views revealed no abnormalities.  The patient tolerated the procedure without immediate complications.  The scope was then withdrawn from the patient and the procedure terminated.  TIME TO CECUM:   4 minutes 00 seconds WITHDRAW TIME:  6 minutes 00 seconds  IMPRESSION:     Normal colonoscopy upto the terminal ileum.   RECOMMENDATIONS:     1.  High fiber diet with liberal fluid intake. 2.  Continue current medications.   REPEAT EXAM:      In 5 years  for a repeat colonoscopy.  If the patient has any abnormal GI symptoms in the interim, she have been advised to contact the office as soon as possible for further recommendations.   CPT CODES:     [id number], Screening Colonoscopy   DIAGNOSIS CODES:     564.00 Constipation, V12.72, Personal history of polyps; V76.51 Colorectal cancer screening  REFERRED IW:OEHOZYY Chancy Milroy, M.D.  Emmie Niemann, PA-C  eSigned:  Dr. Edmonia James, MD 06/13/2013 2:43 PM   PATIENT NAME:  Kathryn, [name] MR#: [id number]

## 2013-06-14 ENCOUNTER — Encounter (HOSPITAL_COMMUNITY): Payer: Self-pay | Admitting: Gastroenterology

## 2013-07-11 ENCOUNTER — Ambulatory Visit
Admission: RE | Admit: 2013-07-11 | Discharge: 2013-07-11 | Disposition: A | Discharge status: Home / Self Care | Payer: Medicare Other | Source: Ambulatory Visit | Attending: Family | Admitting: Family

## 2013-07-11 DIAGNOSIS — Z853 Personal history of malignant neoplasm of breast: Secondary | ICD-10-CM

## 2013-07-15 ENCOUNTER — Other Ambulatory Visit: Payer: Self-pay | Admitting: *Deleted

## 2013-07-15 DIAGNOSIS — C50319 Malignant neoplasm of lower-inner quadrant of unspecified female breast: Secondary | ICD-10-CM

## 2013-07-18 ENCOUNTER — Encounter: Payer: Self-pay | Admitting: Oncology

## 2013-07-18 ENCOUNTER — Ambulatory Visit (HOSPITAL_BASED_OUTPATIENT_CLINIC_OR_DEPARTMENT_OTHER): Payer: Medicare Other | Admitting: Oncology

## 2013-07-18 ENCOUNTER — Encounter (INDEPENDENT_AMBULATORY_CARE_PROVIDER_SITE_OTHER): Payer: Self-pay

## 2013-07-18 ENCOUNTER — Other Ambulatory Visit (HOSPITAL_BASED_OUTPATIENT_CLINIC_OR_DEPARTMENT_OTHER): Payer: Medicare Other

## 2013-07-18 ENCOUNTER — Telehealth: Payer: Self-pay | Admitting: *Deleted

## 2013-07-18 VITALS — BP 152/83 | HR 77 | Temp 98.4°F | Resp 19 | Ht 62.0 in | Wt 285.3 lb

## 2013-07-18 DIAGNOSIS — Z170 Estrogen receptor positive status [ER+]: Secondary | ICD-10-CM

## 2013-07-18 DIAGNOSIS — C773 Secondary and unspecified malignant neoplasm of axilla and upper limb lymph nodes: Secondary | ICD-10-CM

## 2013-07-18 DIAGNOSIS — C50319 Malignant neoplasm of lower-inner quadrant of unspecified female breast: Secondary | ICD-10-CM

## 2013-07-18 LAB — CBC WITH DIFFERENTIAL/PLATELET
BASO%: 1.1 % (ref 0.0–2.0)
BASOS ABS: 0.1 10*3/uL (ref 0.0–0.1)
EOS%: 3.4 % (ref 0.0–7.0)
Eosinophils Absolute: 0.2 10*3/uL (ref 0.0–0.5)
HEMATOCRIT: 38.2 % (ref 34.8–46.6)
HEMOGLOBIN: 12.5 g/dL (ref 11.6–15.9)
LYMPH%: 37.6 % (ref 14.0–49.7)
MCH: 29.1 pg (ref 25.1–34.0)
MCHC: 32.7 g/dL (ref 31.5–36.0)
MCV: 88.9 fL (ref 79.5–101.0)
MONO#: 0.4 10*3/uL (ref 0.1–0.9)
MONO%: 8 % (ref 0.0–14.0)
NEUT%: 49.9 % (ref 38.4–76.8)
NEUTROS ABS: 2.5 10*3/uL (ref 1.5–6.5)
Platelets: 175 10*3/uL (ref 145–400)
RBC: 4.3 10*6/uL (ref 3.70–5.45)
RDW: 16 % — ABNORMAL HIGH (ref 11.2–14.5)
WBC: 5.1 10*3/uL (ref 3.9–10.3)
lymph#: 1.9 10*3/uL (ref 0.9–3.3)

## 2013-07-18 LAB — COMPREHENSIVE METABOLIC PANEL (CC13)
ALT: 10 U/L (ref 0–55)
AST: 14 U/L (ref 5–34)
Albumin: 4 g/dL (ref 3.5–5.0)
Alkaline Phosphatase: 89 U/L (ref 40–150)
Anion Gap: 12 mEq/L — ABNORMAL HIGH (ref 3–11)
BILIRUBIN TOTAL: 1.08 mg/dL (ref 0.20–1.20)
BUN: 18.4 mg/dL (ref 7.0–26.0)
CALCIUM: 10.1 mg/dL (ref 8.4–10.4)
CO2: 30 mEq/L — ABNORMAL HIGH (ref 22–29)
CREATININE: 1 mg/dL (ref 0.6–1.1)
Chloride: 104 mEq/L (ref 98–109)
Glucose: 111 mg/dl (ref 70–140)
Potassium: 3.9 mEq/L (ref 3.5–5.1)
Sodium: 146 mEq/L — ABNORMAL HIGH (ref 136–145)
Total Protein: 7.5 g/dL (ref 6.4–8.3)

## 2013-07-18 MED ORDER — ANASTROZOLE 1 MG PO TABS: ORAL_TABLET | ORAL | Status: DC

## 2013-07-18 NOTE — Progress Notes (Signed)
[place]  [phone] [phone]  OFFICE PROGRESS NOTE   PCP:  Stephens Shire, M.D. SU: Neldon Mc, M.D. RAD ONC: Arloa Koh, M.D.   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   PRIOR THERAPY: #1 The patient was originally seen at the multidisciplinary breast clinic on 08/05/2011 for a stage IIB, invasive ductal carcinoma (T3 NX MX).   #2  Status post right simple mastectomy with right axillary lymph node resection on 08/14/2011 with the final pathology revealing invasive and in situ ductal carcinoma. Two nodules were seen 3 cm and 2.2 cm.  Margins were negative.  There was focal lymphovascular space involvement by tumor. 1/15 sentinel lymph nodes were positive for metastatic disease.  The tumor was estrogen receptor positive 100%,  progesterone receptor positive 100%,  and HER-2/neu negative-no amplification by CISH with a ratio of 1.09, proliferation marker 53%. Final pathologic staging was stage IIB, pT2 pN1a, pMX.    #3.  S/P Adjuvant chemotherapy with Taxotere/Cytoxan  Q3 weeks beginning on 09/22/2011.  The patient is a participant in NSBP B-49 protocol.  She completed total of 6 cycles of Taxotere/Cytoxan given from 09/22/2011 -  01/05/2012.  #4   Status post radiation therapy to the right chest wall and axilla completed on 03/31/2012.  #5  She began adjuvant antiestrogen therapy with Arimidex 1 mg by mouth daily in 03/2012.  A total of 5 years of therapy is planned.  #6   bone density scan:  in 03/2012 showed a T score of 1.1 (normal).   CURRENT THERAPY: Arimidex 1 mg daily starting in 03/2012.   INTERVAL HISTORY:   KathrynKathryn [name] 63 y.o. female was seen today for followup of right breast invasive and in situ ductal carcinoma.  Patient is doing incredibly well. She has very minimal residual side  effects from her chemotherapy that she receive back in 2013. She is now on Arimidex 1 mg daily since November 2013 and is tolerating it very well except for hot flashes. She states that the hot flashes are manageable. Patient is Y. exercising almost on a daily basis. She is trying to maintain her weight and lose some as well. She is eating healthy year. She does continue to have right upper extremity lymphedema. She does wear her compression sleeve. She will also need another one in about 2-3 months time. Patient's last bone density scan was in November 2013. She will be due for another one in November 2015. We will plan on having this scheduled on her next visit. She otherwise denies any nausea vomiting fevers chills night sweats shortness of breath chest pains or palpitations. Remainder of the 10 point review of systems is negative.  MEDICAL HISTORY: Past Medical History  Diagnosis Date  . Hypertension   . Asthma   . Hyperlipemia   . Heart murmur   . Arthritis   . OSA (obstructive sleep apnea)     CPAP settings- ?   . Blood transfusion     hx of last one in 1987  . Cancer of lower-inner quadrant of female breast 07/24/2011    right breast cancer /IDC,stage IIB,er/pr=+,her2=Neg  . MRSA (methicillin resistant staph aureus) culture positive     08/2011  . Breast cancer   . Lymphedema     right arm wears a sleeve  . History of chemotherapy     taxotere/cytoxan  6 cycles 09/22/11-01/05/12 day 2 neulasta  . Neuromuscular disorder     peripheral neuropathy hands feet  . 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 Christella Noa follows  . Glaucoma     ALLERGIES:  is allergic to trandolapril-verapamil hcl er.   SURGICAL HISTORY:  Past Surgical History  Procedure Laterality Date  . Tubal ligation    . Dilation and curettage of uterus      x3  . Carpal tunnel release Right     2012  . Cholecystectomy  1987    open - Dr Lindon Romp  . Mastectomy modified radical   08/14/11    right , ER/PR +, HER2 -  . Abdominal hysterectomy  2007    TAH/BSO  . Breast surgery      mastectomy RIGHT  . Portacath placement  09/11/2011    Procedure: INSERTION PORT-A-CATH;  Surgeon: Joyice Faster. Cornett, MD;  Location: [facility];  Service: General;  Laterality: N/A;  Insert of Port  . Port-a-cath removal  06/22/2012    Procedure: MINOR REMOVAL PORT-A-CATH;  Surgeon: Haywood Lasso, MD;  Location: [place];  Service: General;  Laterality: Left;  Marland Kitchen Eye surgery      laser eye surgery for glaucoma  . Port-a-cath insertion      port-a-cath removal 1'14  . Colonoscopy N/A 06/13/2013    Procedure: COLONOSCOPY;  Surgeon: Juanita Craver, MD;  Location: [facility];  Service: Endoscopy;  Laterality: N/A;    REVIEW OF SYSTEMS:   A 10 point review of systems was completed and is negative except for right upper extremity lymphedema, joint aches, hot flashes and night sweats.  The patient asked for and was referred to lymphedema clinic for further lymphedema physical therapy and to be measured for a new compression sleeve and glove.  We will ask [facility] PT/rehabilitation cement the patient's compression sleeve/glove orders once her lymphedema measurements are completed.  PHYSICAL EXAMINATION:  BP 152/83  Pulse 77  Temp(Src) 98.4 F (36.9 C) (Oral)  Resp 19  Ht 5' 2"  (1.575 m)  Wt 285 lb 4.8 oz (129.411 kg)  BMI 52.17 kg/m2  General appearance: Alert, cooperative, well nourished, obese, no apparent distress Head: Normocephalic, without obvious abnormality, atraumatic Eyes: Arcus senilis, PERRLA, EOMI Nose: Nares, septum and mucosa are normal, no drainage or sinus tenderness Neck: Supple, symmetrical, trachea midline, no tenderness, excessive habitus Resp: Clear to auscultation bilaterally Cardio: Regular rate and rhythm, S1, S2 normal, no murmur, click, rub or gallop Breasts:  Deferred GI: Soft, distended, non-tender, hypoactive bowel sounds, excessive  habitus Extremities: Extremities normal, atraumatic, no cyanosis, visible right upper extremity lymphedema (patient currently wearing compression sleeve), bilateral lower extremity generalized edema Lymph nodes: Cervical and supraclavicular lymph nodes are normal Neurologic: Grossly normal  ECOG PERFORMANCE STATUS: 1 - Symptomatic but completely ambulatory  LABORATORY DATA: Results for orders placed in visit on 07/18/13 (from the past 48 hour(s))  CBC WITH DIFFERENTIAL     Status: Abnormal   Collection Time    07/18/13  8:19 AM      Result Value Ref Range   WBC 5.1  3.9 - 10.3 10e3/uL   NEUT# 2.5  1.5 - 6.5 10e3/uL   HGB 12.5  11.6 - 15.9 g/dL   HCT 38.2  34.8 - 46.6 %   Platelets 175  145 - 400 10e3/uL   MCV 88.9  79.5 - 101.0 fL   MCH 29.1  25.1 - 34.0 pg   MCHC 32.7  31.5 - 36.0 g/dL   RBC 4.30  3.70 - 5.45 10e6/uL   RDW 16.0 (*) 11.2 - 14.5 %   lymph# 1.9  0.9 - 3.3 10e3/uL   MONO# 0.4  0.1 - 0.9 10e3/uL   Eosinophils Absolute 0.2  0.0 - 0.5 10e3/uL   Basophils Absolute 0.1  0.0 - 0.1 10e3/uL   NEUT% 49.9  38.4 - 76.8 %   LYMPH% 37.6  14.0 - 49.7 %   MONO% 8.0  0.0 - 14.0 %   EOS% 3.4  0.0 - 7.0 %   BASO% 1.1  0.0 - 2.0 %     ASSESSMENT/PLAN: Mrs. Dejonge is a 63 year old female:  #1 Status post right simple mastectomy with right axillary lymph node resection on 08/14/2011 with the final pathology revealing invasive and in situ ductal carcinoma.  Two nodules were seen 3 cm and 2.2 cm.  Margins were negative.  There was focal lymphovascular space involvement by tumor. 1/15 sentinel lymph nodes were positive for metastatic disease.  The tumor was estrogen receptor positive 100%,  progesterone receptor positive 100%,  and HER-2/neu negative-no amplification by CISH with a ratio of 1.09, proliferation marker 53%. Final pathologic staging was stage IIB, pT2 pN1a, pMX.    #2 The patient was participant in clinical trial NSABP B-49 utilizing Taxotere/Cytoxan x 6 cycles versus an  anthracycline. Patient was randomized to Taxotere/Cytoxan every 3 weeks for a total of 6 cycles.  Her research nurse was Raoul Pitch, Therapist, sports.   #3  S/P  6 cycles of Taxotere/Cytoxan given from 09/22/2011 -  01/05/2012.  Overall she tolerated her chemotherapy well without any problems.  #4 Status post radiation therapy to the right chest wall and axilla completed on 03/31/2012.  #5  She began adjuvant antiestrogen therapy with Arimidex 1 mg by mouth daily in 03/2012.  A total of 5 years of therapy is planned.  #6  right upper extremity lymphedema: Continue the use of lymphedema sleeve.  #7 Hot flashes/night sweats/joint aches: She is exercising and maintaining a healthy weight.  #8 bone density: Her next bone density will be November 2015.  #9 mammogram: Patient had a mammogram of the left breast performed. Reveals no evidence of new disease. Her next mammogram will be in February 2016.  #10 survivorship: We discussed exercise eating healthy and maintaining a good BMI  #11 prescriptions: Prescription for a remote ask 1 mg daily #90 with 12 refills was electronically sent to her pharmacy  #12 followup: Patient will be seen back in 6 months time with a CBC, chemistries, and provider visit.    All questions were answered.  Mrs. Silguero encouraged to contact us in the interim if she has any problems, questions or concerns.  Marcy Panning, MD Medical/Oncology [facility] [phone] (beeper) [phone] (Office)  07/18/2013, 10:12 AM

## 2013-07-18 NOTE — Patient Instructions (Signed)
Breast Cancer Survivor Follow-Up Breast cancer begins when cells in the breast divide too rapidly. The extra cells form a lump (tumor). When the cancer is treated, the goal is to get rid of all cancer cells. However, sometimes a few cells survive. These cancer cells can then grow. They become recurrent cancer. This means the cancer comes back after treatment.  Most cases of recurrent breast cancer develop 3 to 5 years after treatment. However, sometimes it comes back just a few months after treatment. Other times, it does not come back until years later. If the cancer comes back in the same area as the first breast cancer, it is called a local recurrence. If the cancer comes back somewhere else in the body, it is called regional recurrence if the site is fairly near the breast or distant recurrence if it is far from the breast. Your caregiver may also use the term metastasize to indicate a cancer that has gone to another part of your body. Treatment is still possible after either kind of recurrence. The cancer can still be controlled.  CAUSES OF RECURRENT CANCER No one knows exactly why breast cancer starts in the first place. Why the cancer comes back after treatment is also not clear. It is known that certain conditions, called risk factors, can make this more likely. They include:  Developing breast cancer for the first time before age 73.  Having breast cancer that involves the lymph nodes. These are small, round pieces of tissue found all over the body. Their job is to help fight infections.  Having a large tumor. Cancer is more apt to come back if the first tumor was bigger than 2 inches (5 cm).  Having certain types of breast cancer, such as:  Inflammatory breast cancer. This rare type grows rapidly and causes the breast to become red and swollen.  A high-grade tumor. The grade of a tumor indicates how fast it will grow and spread. High-grade tumors grow more quickly than other types.  HER2  cancer. This refers to the tumor's genetic makeup. Tumors that have this type of gene are more likely to come back after treatment.  Having close tumor margins. This refers to the space between the tumor and normal, noncancerous cells. If the space is small, the tumor has a greater chance of coming back.  Having treatment involving a surgery to remove the tumor but not the entire breast (lumpectomy) and no radiation therapy. CARE AFTER BREAST CANCER Home Monitoring Women who have had breast cancer should continue to examine their breasts every month. The goal is to catch the cancer quickly if it comes back. Many women find it helpful to do so on the same day each month and to mark the calendar as a reminder. Let your caregiver know immediately if you have any signs of recurrent breast cancer. Symptoms will vary, depending on where the cancer recurs. The original type of treatment can also make a difference. Symptoms of local recurrence after a lumpectomy or a recurrence in the opposite breast may include:  A new lump or thickening in the breast.  A change in the way the skin looks on the breast (such as a rash, dimpling, or wrinkling).  Redness or swelling of the breast.  Changes in the nipple (such as being red, puckered, swollen, or leaking fluid). Symptoms of a recurrence after a breast removal surgery (mastectomy) may include:  A lump or thickening under the skin.  A thickening around the mastectomy scar. Symptoms  of regional recurrence in the lymph nodes near the breast may include:  A lump under the arm or above the collarbone.  Swelling of the arm.  Pain in the arm, shoulder, or chest.  Numbness in the hand or arm. Symptoms of distant recurrence may include:  A cough that does not go away.  Trouble breathing or shortness of breath.  Pain in the bones or the chest. This is pain that lasts or does not respond to rest and medicine.  Headaches.  Sudden vision  problems.  Dizziness.  Nausea or vomiting.  Losing weight without trying to.  Persistent abdominal pain.  Changes in bowel movements or blood in the stool.  Yellowing of the skin or eyes (jaundice).  Blood in the urine or bloody vaginal discharge. Clinical Monitoring  It is helpful to keep a schedule of appointments for needed tests and exams. This includes physical exams, breast exams, exams of the lymph nodes, and general exams.  For the first 3 years after being treated for breast cancer, see your caregiver every 3 to 6 months.  For years 4 and 5 after breast cancer, see your caregiver every 6 to 12 months.  After 5 years, see your caregiver at least once a year.  Regular breast X-rays (mammograms) should continue even if you had a mastectomy.  A mammogram should be done 1 year after the mammogram that first detected breast cancer.  A mammogram should be done every 6 to 12 months after that. Follow your caregiver's advice.  A pelvic exam done by your caregiver checks whether female organs are the normal size and shape. The exam is usually done every year. Ask your caregiver if that schedule is right for you.  Women taking tamoxifen should report any vaginal bleeding immediately to their caregiver. Tamoxifen is often given to women with a certain type of breast cancer. It has been shown to help prevent recurrence.  You will need to decide who your primary caregiver will be.  Most people continue to see their cancer specialist (oncologist) every 3 to 6 months for the first year after cancer treatment.  At some point, you may want to go back to seeing your family caregiver. You would no longer see your oncologist for regular checkups. Many women do this about 1 year after their first diagnosis of breast cancer.  You will still need to be seen every so often by your oncologist. Ask how often that should be. Coordinate this with your family or primary caregiver.  Think about  having genetic counseling. This would provide information on traits that can be passed or inherited from one generation to the next. In some cases, breast cancer runs in families. Tell your caregiver if you:  Are of Ashkenazi Jewish heritage.  Have any family member who has had ovarian cancer.  Have a mother, sister, or daughter who had breast cancer before age 7.  Have 2 or more close relatives who have had breast cancer. This means a mother, sister, daughter, aunt, or grandmother.  Had breast cancer in both breasts.  Have a female relative who has had breast cancer.  Some tests are not recommended for routine screening. Someone recovering from breast cancer does not need to have these tests if there are no problems. The tests have risks, such as radiation exposure, and can be costly. The risks of these tests are thought to be greater than the benefits:  Blood tests.  Chest X-rays.  Bone scans.  Liver ultrasound.  Computed tomography (CT scan).  Positron emission tomography (PET scan).  Magnetic resonance imaging (MRI scan). DIAGNOSIS OF RECURRENT CANCER Recurrent breast cancer may be suspected for various reasons. A mammogram may not look normal. You might feel a lump or have other symptoms. Your caregiver may find something unusual during an exam. To be sure, your caregiver will probably order some tests. The tests are needed because there are symptoms or hints of a problem. They could include:  Blood tests, including a test to check how well the liver is working. The liver is a common site for a distant cancer recurrence.  Imaging tests that create pictures of the inside of the body. These tests include:  Chest X-rays to show if the cancer has come back in the lungs.  CT scans to create detailed pictures of various areas of the body and help find a distant recurrence.  MRI scans to find anything unusual in the breast, chest, or lymph nodes.  Breast ultrasound tests to  examine the breasts.  Bone scans to create a picture of your whole skeleton and find cancer in bony areas.  PET scans to create an image of the whole body. PET scans can be used together with CT scans to show more detail.  Biopsy. A small sample of tissue is taken and checked under a microscope. If cancer cells are found, they may be tested to see if they contain the HER2 gene or the hormones estrogen and progesterone. This will help your caregiver decide how to treat the recurrent cancer. TREATMENT  How recurrent breast cancer is treated depends on where the new cancer is found. The type of treatment that was used for the first breast cancer makes a difference, too. A combination of treatments may be used. Options include:  Surgery.  If the cancer comes back in the breast that was not treated before, you may need a lumpectomy or mastectomy.  If the cancer comes back in the breast that was treated before, you may need a mastectomy.  The lymph nodes under the arm may need to be removed.  Radiation therapy.  For a local recurrence, radiation may be used if it was not used during the first treatment.  For a distance recurrence, radiation is sometimes used.  Chemotherapy.  This may be used before surgery to treat recurrent breast cancer.  This may be used to treat recurrent cancer that cannot be treated with surgery.  This may be used to treat a distant recurrence.  Hormone therapy.  Women with the HER2 gene may be given hormone therapy to attack this gene. Document Released: 01/08/2011 Document Revised: 08/04/2011 Document Reviewed: 01/08/2011 [employer] Patient Information 2014 [employer].  

## 2013-07-18 NOTE — Telephone Encounter (Signed)
appts made and printed...td 

## 2013-11-23 ENCOUNTER — Encounter: Payer: Self-pay | Admitting: Pulmonary Disease

## 2013-11-23 ENCOUNTER — Ambulatory Visit: Payer: Medicare Other | Admitting: Pulmonary Disease

## 2013-11-23 VITALS — BP 112/70 | HR 79 | Temp 97.7°F | Ht 62.0 in | Wt 295.2 lb

## 2013-11-23 DIAGNOSIS — G4733 Obstructive sleep apnea (adult) (pediatric): Secondary | ICD-10-CM

## 2013-11-23 NOTE — Progress Notes (Signed)
   Subjective:    Patient ID: [name], female    DOB: 08-31-1950, 63 y.o.   MRN: [id number]  HPI The patient comes in today for followup of her obstructive sleep apnea. She is wearing CPAP compliantly, and is having no issues with her mask fit or pressure. She feels that she sleeps well during the night, and has adequate daytime alertness. Of note, her weight is up 5 pounds since the last visit a year ago.   Review of Systems  Constitutional: Negative for fever and unexpected weight change.  HENT: Negative for congestion, dental problem, ear pain, nosebleeds, postnasal drip, rhinorrhea, sinus pressure, sneezing, sore throat and trouble swallowing.   Eyes: Negative for redness and itching.  Respiratory: Negative for cough, chest tightness, shortness of breath and wheezing.   Cardiovascular: Negative for palpitations and leg swelling.  Gastrointestinal: Negative for nausea and vomiting.  Genitourinary: Negative for dysuria.  Musculoskeletal: Negative for joint swelling.  Skin: Negative for rash.  Neurological: Negative for headaches.  Hematological: Does not bruise/bleed easily.  Psychiatric/Behavioral: Negative for dysphoric mood. The patient is not nervous/anxious.        Objective:   Physical Exam Obese female in no acute distress Nose without purulence or discharge noted Neck without lymphadenopathy or thyromegaly No skin breakdown or pressure necrosis from the CPAP mass Lower extremities with edema, no cyanosis Alert and oriented, moves all 4 extremities.       Assessment & Plan:

## 2013-11-23 NOTE — Assessment & Plan Note (Signed)
Patient feels that she is doing very well with CPAP, and I have asked her to continue on this. She will need to keep up with her mask changes and supplies, and I've encouraged her to work aggressively on weight loss.

## 2013-11-23 NOTE — Patient Instructions (Signed)
Continue on cpap, and keep up with mask changes and supplies. Work on weight loss followup with me in one year.

## 2014-01-18 ENCOUNTER — Other Ambulatory Visit: Payer: Self-pay | Admitting: *Deleted

## 2014-01-18 DIAGNOSIS — C50319 Malignant neoplasm of lower-inner quadrant of unspecified female breast: Secondary | ICD-10-CM

## 2014-01-19 ENCOUNTER — Other Ambulatory Visit: Payer: Medicare Other

## 2014-01-19 ENCOUNTER — Encounter: Payer: Self-pay | Admitting: Adult Health

## 2014-01-19 ENCOUNTER — Ambulatory Visit: Payer: Medicare Other | Admitting: Oncology

## 2014-01-19 ENCOUNTER — Other Ambulatory Visit (HOSPITAL_BASED_OUTPATIENT_CLINIC_OR_DEPARTMENT_OTHER): Payer: Medicare Other

## 2014-01-19 ENCOUNTER — Ambulatory Visit (HOSPITAL_BASED_OUTPATIENT_CLINIC_OR_DEPARTMENT_OTHER): Payer: Medicare Other | Admitting: Adult Health

## 2014-01-19 VITALS — BP 138/74 | HR 60 | Temp 98.2°F | Resp 18 | Ht 62.0 in | Wt 301.0 lb

## 2014-01-19 DIAGNOSIS — Z170 Estrogen receptor positive status [ER+]: Secondary | ICD-10-CM

## 2014-01-19 DIAGNOSIS — E669 Obesity, unspecified: Secondary | ICD-10-CM

## 2014-01-19 DIAGNOSIS — C773 Secondary and unspecified malignant neoplasm of axilla and upper limb lymph nodes: Secondary | ICD-10-CM

## 2014-01-19 DIAGNOSIS — C50319 Malignant neoplasm of lower-inner quadrant of unspecified female breast: Secondary | ICD-10-CM

## 2014-01-19 DIAGNOSIS — E2839 Other primary ovarian failure: Secondary | ICD-10-CM

## 2014-01-19 DIAGNOSIS — Z853 Personal history of malignant neoplasm of breast: Secondary | ICD-10-CM

## 2014-01-19 DIAGNOSIS — C50311 Malignant neoplasm of lower-inner quadrant of right female breast: Secondary | ICD-10-CM

## 2014-01-19 LAB — COMPREHENSIVE METABOLIC PANEL (CC13)
ALK PHOS: 80 U/L (ref 40–150)
ALT: 13 U/L (ref 0–55)
AST: 13 U/L (ref 5–34)
Albumin: 3.7 g/dL (ref 3.5–5.0)
Anion Gap: 9 mEq/L (ref 3–11)
BUN: 15.8 mg/dL (ref 7.0–26.0)
CHLORIDE: 105 meq/L (ref 98–109)
CO2: 27 meq/L (ref 22–29)
CREATININE: 0.9 mg/dL (ref 0.6–1.1)
Calcium: 9.4 mg/dL (ref 8.4–10.4)
Glucose: 107 mg/dl (ref 70–140)
POTASSIUM: 3.6 meq/L (ref 3.5–5.1)
Sodium: 142 mEq/L (ref 136–145)
Total Bilirubin: 1.18 mg/dL (ref 0.20–1.20)
Total Protein: 7 g/dL (ref 6.4–8.3)

## 2014-01-19 LAB — CBC WITH DIFFERENTIAL/PLATELET
BASO%: 0.2 % (ref 0.0–2.0)
Basophils Absolute: 0 10*3/uL (ref 0.0–0.1)
EOS%: 3.5 % (ref 0.0–7.0)
Eosinophils Absolute: 0.2 10*3/uL (ref 0.0–0.5)
HEMATOCRIT: 35.7 % (ref 34.8–46.6)
HGB: 11.4 g/dL — ABNORMAL LOW (ref 11.6–15.9)
LYMPH#: 1.7 10*3/uL (ref 0.9–3.3)
LYMPH%: 34.4 % (ref 14.0–49.7)
MCH: 28.9 pg (ref 25.1–34.0)
MCHC: 31.9 g/dL (ref 31.5–36.0)
MCV: 90.4 fL (ref 79.5–101.0)
MONO#: 0.3 10*3/uL (ref 0.1–0.9)
MONO%: 5.5 % (ref 0.0–14.0)
NEUT#: 2.8 10*3/uL (ref 1.5–6.5)
NEUT%: 56.4 % (ref 38.4–76.8)
Platelets: 150 10*3/uL (ref 145–400)
RBC: 3.95 10*6/uL (ref 3.70–5.45)
RDW: 14.5 % (ref 11.2–14.5)
WBC: 4.9 10*3/uL (ref 3.9–10.3)

## 2014-01-19 NOTE — Progress Notes (Signed)
[facility]  Telephone:([phone] [name] [phone]  OFFICE PROGRESS NOTE   PCP:  Stephens Shire, M.D. SU: Kathryn [name], M.D. RAD ONC: Arloa Koh, M.D.   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   PRIOR THERAPY: #1 The patient was originally seen at the multidisciplinary breast clinic on 08/05/2011 for a stage IIB, invasive ductal carcinoma (T3 NX MX).   #2  Status post right simple mastectomy with right axillary lymph node resection on 08/14/2011 with the final pathology revealing invasive and in situ ductal carcinoma. Two nodules were seen 3 cm and 2.2 cm.  Margins were negative.  There was focal lymphovascular space involvement by tumor. 1/15 sentinel lymph nodes were positive for metastatic disease.  The tumor was estrogen receptor positive 100%,  progesterone receptor positive 100%,  and HER-2/neu negative-no amplification by CISH with a ratio of 1.09, proliferation marker 53%. Final pathologic staging was stage IIB, pT2 pN1a, pMX.    #3.  S/P Adjuvant chemotherapy with Taxotere/Cytoxan  Q3 weeks beginning on 09/22/2011.  The patient is a participant in NSBP B-49 protocol.  She completed total of 6 cycles of Taxotere/Cytoxan given from 09/22/2011 -  01/05/2012.  #4   Status post radiation therapy to the right chest wall and axilla completed on 03/31/2012.  #5  She began adjuvant antiestrogen therapy with Arimidex 1 mg by mouth daily in 03/2012.  A total of 5 years of therapy is planned.  #6   bone density scan:  in 03/2012 showed a T score of 1.1 (normal).   CURRENT THERAPY: Arimidex 1 mg daily starting in 03/2012.   INTERVAL HISTORY:   Kathryn [name] 63 y.o. [name] was seen today for followup of right breast invasive and in situ ductal carcinoma. Kathryn [name] is doing well today.  She is taking Arimidex daily and is  tolerating it moderately well.  She is obese and is requesting information on how to lose weight.  She does have lymphedema and wears a sleeve during the day, and has a sleeve that she sleeps in at night.  She thinks she will need a new sleeve ordered in 6 months.  She does have occasional abdominal pain that she associates with gas that has been going on the past "couple of weeks".  Kathryn [name] does have occasional hot flashes, but otherwise denies fevers, chills, bowel/bladder changes, new pain, weakness, headaches, vision changes, or any further concerns.  We updated her health maintenance below.   MEDICAL HISTORY: Past Medical History  Diagnosis Date  . Hypertension   . Asthma   . Hyperlipemia   . Heart murmur   . Arthritis   . OSA (obstructive sleep apnea)     CPAP settings- ?   . Blood transfusion     hx of last one in 1987  . Cancer of lower-inner quadrant of [name] breast 07/24/2011    right breast cancer /IDC,stage IIB,er/pr=+,her2=Neg  . MRSA (methicillin resistant staph aureus) culture positive     08/2011  . Breast cancer   . Lymphedema     right arm wears a sleeve  . History of chemotherapy     taxotere/cytoxan 6 cycles 09/22/11-01/05/12 day 2 neulasta  . Neuromuscular disorder     peripheral neuropathy hands feet  . 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 Christella Noa follows  . Glaucoma     ALLERGIES:  is allergic to trandolapril-verapamil hcl er.   SURGICAL HISTORY:  Past Surgical History  Procedure Laterality Date  . Tubal ligation    . Dilation and curettage of uterus      x3  . Carpal tunnel release Right     2012  . Cholecystectomy  1987    open - Dr Lindon Romp  . Mastectomy modified radical  08/14/11    right , ER/PR +, HER2 -  . Abdominal hysterectomy  2007    TAH/BSO  . Breast surgery      mastectomy RIGHT  . Portacath placement  09/11/2011    Procedure: INSERTION PORT-A-CATH;  Surgeon: Joyice Faster. Cornett, MD;  Location: [facility];   Service: General;  Laterality: N/A;  Insert of Port  . Port-a-cath removal  06/22/2012    Procedure: MINOR REMOVAL PORT-A-CATH;  Surgeon: Haywood Lasso, MD;  Location: [facility];  Service: General;  Laterality: Left;  Kath[facility] Kitchen Eye surgery      laser eye surgery for glaucoma  . Port-a-cath insertion      port-a-cath removal 1'14  . Colonoscopy N/A 06/13/2013    Procedure: COLONOSCOPY;  Surgeon: Juanita Craver, MD;  Location: [facility];  Service: Endoscopy;  Laterality: N/A;    REVIEW OF SYSTEMS:   A 10 point review of systems was conducted and is otherwise negative except for what is noted above.    Health Maintenance  Mammogram: 07/11/2013 Colonoscopy: 05/2013 with Dr. Collene Mares, f/u in 5 years was recommended Bone Density Scan: 04/21/2012 normal Pap Smear: due in 01/2014, done by Dr. Leonie Douglas Exam: 01/18/2014 Vitamin D Level: followed by Dr. Rica Mote Lipid Panel: 05/2013, on Zocor, followed by PCP   PHYSICAL EXAMINATION:  BP 138/74  Pulse 60  Temp(Src) 98.2 F (36.8 C) (Oral)  Resp 18  Ht 5' 2"  (1.575 m)  Wt 301 lb (136.533 kg)  BMI 55.04 kg/m2  GENERAL: Patient is a well appearing [name] in no acute distress HEENT:  Sclerae anicteric.  Oropharynx clear and moist. No ulcerations or evidence of oropharyngeal candidiasis. Neck is supple.  NODES:  No cervical, supraclavicular, or axillary lymphadenopathy palpated.  BREAST EXAM:  S/p right mastectomy, no nodularity, left breast without masses or nodules, benign bilateral breast exam.   LUNGS:  Clear to auscultation bilaterally.  No wheezes or rhonchi. HEART:  Regular rate and rhythm. No murmur appreciated. ABDOMEN:  Soft, nontender.  Positive, normoactive bowel sounds. No organomegaly palpated. MSK:  No focal spinal tenderness to palpation. Full range of motion bilaterally in the upper extremities. EXTREMITIES:  No peripheral edema.  Right arm with lymphedema sleeve SKIN:  Clear with no obvious rashes or skin changes.  No nail dyscrasia. NEURO:  Nonfocal. Well oriented.  Appropriate affect. ECOG PERFORMANCE STATUS: 1 - Symptomatic but completely ambulatory  LABORATORY DATA: Results for orders placed in visit on 01/19/14 (from the past 48 hour(s))  CBC WITH DIFFERENTIAL     Status: Abnormal   Collection Time    01/19/14  8:52 AM      Result Value Ref Range   WBC 4.9  3.9 - 10.3 10e3/uL   NEUT# 2.8  1.5 - 6.5 10e3/uL   HGB 11.4 (*) 11.6 - 15.9 g/dL   HCT 35.7  34.8 - 46.6 %   Platelets 150  145 - 400 10e3/uL   MCV 90.4  79.5 - 101.0 fL   MCH 28.9  25.1 - 34.0 pg   MCHC 31.9  31.5 - 36.0 g/dL   RBC 3.95  3.70 - 5.45 10e6/uL   RDW 14.5  11.2 - 14.5 %   lymph# 1.7  0.9 - 3.3 10e3/uL   MONO# 0.3  0.1 - 0.9 10e3/uL   Eosinophils Absolute 0.2  0.0 - 0.5 10e3/uL   Basophils Absolute 0.0  0.0 - 0.1 10e3/uL   NEUT% 56.4  38.4 - 76.8 %   LYMPH% 34.4  14.0 - 49.7 %   MONO% 5.5  0.0 - 14.0 %   EOS% 3.5  0.0 - 7.0 %   BASO% 0.2  0.0 - 2.0 %     ASSESSMENT: Kathryn [name] is a 63 year-old [name]:  #1 Status post right simple mastectomy with right axillary lymph node resection on 08/14/2011 with the final pathology revealing invasive and in situ ductal carcinoma.  Two nodules were seen 3 cm and 2.2 cm.  Margins were negative.  There was focal lymphovascular space involvement by tumor. 1/15 sentinel lymph nodes were positive for metastatic disease.  The tumor was estrogen receptor positive 100%,  progesterone receptor positive 100%,  and HER-2/neu negative-no amplification by CISH with a ratio of 1.09, proliferation marker 53%. Final pathologic staging was stage IIB, pT2 pN1a, pMX.    #2 The patient was participant in clinical trial NSABP B-49 utilizing Taxotere/Cytoxan x 6 cycles versus an anthracycline. Patient was randomized to Taxotere/Cytoxan every 3 weeks for a total of 6 cycles.  Her research nurse was Raoul Pitch, Therapist, sports.   #3  S/P  6 cycles of Taxotere/Cytoxan given from 09/22/2011 -  01/05/2012.  Overall she  tolerated her chemotherapy well without any problems.  #4 Status post radiation therapy to the right chest wall and axilla completed on 03/31/2012.  #5  She began adjuvant antiestrogen therapy with Arimidex 1 mg by mouth daily in 03/2012.  A total of 5 years of therapy is planned.  PLAN:   Keta is doing well today.  She is taking the Arimidex daily and is tolerating it well.  She has no sign of recurrence.  She will continue this.  She is obese.  We discussed this in detail, along with ways to decrease weight such as healthy diet and exercise such as water aerobics.  We reviewed her health maintenance above and I have ordered a repeat mammo, and bone density accordingly.  I recommended monthly breast exams as well and I gave her detailed information about this in her AVS.  She will let us know if her abdominal pain worsens in her right upper quadrant.  Her abdominal exam was benign.  We can get an ultrasound of her abdomen if it persists or worsens.    Kathryn [name] will return in 6 months for labs and f/u.    All the information above was reviewed in detail with Kathryn [name] and she is in agreement with the above plan.  All questions were answered.  Mrs. Ayo encouraged to contact us in the interim if she has any problems, questions or concerns.  Minette Headland, [place] [phone] 01/19/2014, 9:47 AM

## 2014-01-19 NOTE — Patient Instructions (Signed)
You are doing well.  You have no sign of recurrence.  If you notice your abdominal pain become worse, or continue, please let me know.  Continue taking Arimidex.  We will see you back in 6 months.  I recommend healthy diet, exercise, and monthly breast exams.    Breast Self-Awareness Practicing breast self-awareness may pick up problems early, prevent significant medical complications, and possibly save your life. By practicing breast self-awareness, you can become familiar with how your breasts look and feel and if your breasts are changing. This allows you to notice changes early. It can also offer you some reassurance that your breast health is good. One way to learn what is normal for your breasts and whether your breasts are changing is to do a breast self-exam. If you find a lump or something that was not present in the past, it is best to contact your caregiver right away. Other findings that should be evaluated by your caregiver include nipple discharge, especially if it is bloody; skin changes or reddening; areas where the skin seems to be pulled in (retracted); or new lumps and bumps. Breast pain is seldom associated with cancer (malignancy), but should also be evaluated by a caregiver. HOW TO PERFORM A BREAST SELF-EXAM The best time to examine your breasts is 5-7 days after your menstrual period is over. During menstruation, the breasts are lumpier, and it may be more difficult to pick up changes. If you do not menstruate, have reached menopause, or had your uterus removed (hysterectomy), you should examine your breasts at regular intervals, such as monthly. If you are breastfeeding, examine your breasts after a feeding or after using a breast pump. Breast implants do not decrease the risk for lumps or tumors, so continue to perform breast self-exams as recommended. Talk to your caregiver about how to determine the difference between the implant and breast tissue. Also, talk about the amount of  pressure you should use during the exam. Over time, you will become more familiar with the variations of your breasts and more comfortable with the exam. A breast self-exam requires you to remove all your clothes above the waist. 1. Look at your breasts and nipples. Stand in front of a mirror in a room with good lighting. With your hands on your hips, push your hands firmly downward. Look for a difference in shape, contour, and size from one breast to the other (asymmetry). Asymmetry includes puckers, dips, or bumps. Also, look for skin changes, such as reddened or scaly areas on the breasts. Look for nipple changes, such as discharge, dimpling, repositioning, or redness. 2. Carefully feel your breasts. This is best done either in the shower or tub while using soapy water or when flat on your back. Place the arm (on the side of the breast you are examining) above your head. Use the pads (not the fingertips) of your three middle fingers on your opposite hand to feel your breasts. Start in the underarm area and use  inch (2 cm) overlapping circles to feel your breast. Use 3 different levels of pressure (light, medium, and firm pressure) at each circle before moving to the next circle. The light pressure is needed to feel the tissue closest to the skin. The medium pressure will help to feel breast tissue a little deeper, while the firm pressure is needed to feel the tissue close to the ribs. Continue the overlapping circles, moving downward over the breast until you feel your ribs below your breast. Then, move  one finger-width towards the center of the body. Continue to use the  inch (2 cm) overlapping circles to feel your breast as you move slowly up toward the collar bone (clavicle) near the base of the neck. Continue the up and down exam using all 3 pressures until you reach the middle of the chest. Do this with each breast, carefully feeling for lumps or changes. 3.  Keep a written record with breast changes  or normal findings for each breast. By writing this information down, you do not need to depend only on memory for size, tenderness, or location. Write down where you are in your menstrual cycle, if you are still menstruating. Breast tissue can have some lumps or thick tissue. However, see your caregiver if you find anything that concerns you.  SEEK MEDICAL CARE IF:  You see a change in shape, contour, or size of your breasts or nipples.   You see skin changes, such as reddened or scaly areas on the breasts or nipples.   You have an unusual discharge from your nipples.   You feel a new lump or unusually thick areas.  Document Released: 05/12/2005 Document Revised: 04/28/2012 Document Reviewed: 08/27/2011 [facility] Patient Information 2015 [place], [place]. This information is not intended to replace advice given to you by your health care provider. Make sure you discuss any questions you have with your health care provider.

## 2014-02-01 ENCOUNTER — Ambulatory Visit (INDEPENDENT_AMBULATORY_CARE_PROVIDER_SITE_OTHER): Payer: Medicare Other | Admitting: General Surgery

## 2014-03-27 ENCOUNTER — Other Ambulatory Visit: Payer: Self-pay | Admitting: Neurosurgery

## 2014-03-27 DIAGNOSIS — D352 Benign neoplasm of pituitary gland: Secondary | ICD-10-CM

## 2014-03-29 ENCOUNTER — Ambulatory Visit
Admission: RE | Admit: 2014-03-29 | Discharge: 2014-03-29 | Disposition: A | Discharge status: Home / Self Care | Payer: Medicare Other | Source: Ambulatory Visit | Attending: Neurosurgery | Admitting: Neurosurgery

## 2014-03-29 DIAGNOSIS — D352 Benign neoplasm of pituitary gland: Secondary | ICD-10-CM

## 2014-03-29 MED ORDER — GADOBENATE DIMEGLUMINE 529 MG/ML IV SOLN
10.0000 mL | Freq: Once | INTRAVENOUS | Status: AC | PRN
  Administered 2014-03-29: 10 mL via INTRAVENOUS

## 2014-06-20 ENCOUNTER — Other Ambulatory Visit: Payer: Self-pay | Admitting: Gynecology

## 2014-06-20 ENCOUNTER — Other Ambulatory Visit: Payer: Self-pay | Admitting: Obstetrics and Gynecology

## 2014-06-20 DIAGNOSIS — Z9011 Acquired absence of right breast and nipple: Secondary | ICD-10-CM

## 2014-06-20 DIAGNOSIS — Z1231 Encounter for screening mammogram for malignant neoplasm of breast: Secondary | ICD-10-CM

## 2014-06-30 ENCOUNTER — Other Ambulatory Visit: Payer: Self-pay | Admitting: *Deleted

## 2014-07-07 ENCOUNTER — Telehealth: Payer: Self-pay | Admitting: *Deleted

## 2014-07-07 NOTE — Telephone Encounter (Signed)
Spoke with patient and confirmed appointment for 07/20/14 at 830am.  She is a former Dr. Humphrey Rolls patient.  Encouraged her to call with any needs or concerns.  Instructed her that if she were to run out of arimidex before her appointment to call her pharmacy and we would refill it for her.

## 2014-07-14 ENCOUNTER — Ambulatory Visit
Admission: RE | Admit: 2014-07-14 | Discharge: 2014-07-14 | Disposition: A | Discharge status: Home / Self Care | Payer: Medicare Other | Source: Ambulatory Visit | Attending: Obstetrics and Gynecology | Admitting: Obstetrics and Gynecology

## 2014-07-14 DIAGNOSIS — Z9011 Acquired absence of right breast and nipple: Secondary | ICD-10-CM

## 2014-07-14 DIAGNOSIS — Z1231 Encounter for screening mammogram for malignant neoplasm of breast: Secondary | ICD-10-CM

## 2014-07-18 ENCOUNTER — Other Ambulatory Visit: Payer: Self-pay | Admitting: Obstetrics and Gynecology

## 2014-07-18 DIAGNOSIS — R928 Other abnormal and inconclusive findings on diagnostic imaging of breast: Secondary | ICD-10-CM

## 2014-07-20 ENCOUNTER — Ambulatory Visit (HOSPITAL_BASED_OUTPATIENT_CLINIC_OR_DEPARTMENT_OTHER): Payer: Medicare Other | Admitting: Hematology

## 2014-07-20 ENCOUNTER — Other Ambulatory Visit: Payer: Self-pay | Admitting: Hematology

## 2014-07-20 ENCOUNTER — Ambulatory Visit (HOSPITAL_BASED_OUTPATIENT_CLINIC_OR_DEPARTMENT_OTHER): Payer: Medicare Other

## 2014-07-20 ENCOUNTER — Telehealth: Payer: Self-pay | Admitting: Hematology

## 2014-07-20 ENCOUNTER — Encounter: Payer: Self-pay | Admitting: Hematology

## 2014-07-20 VITALS — BP 163/75 | HR 71 | Temp 98.6°F | Resp 18 | Ht 62.0 in | Wt 295.6 lb

## 2014-07-20 DIAGNOSIS — E2839 Other primary ovarian failure: Secondary | ICD-10-CM

## 2014-07-20 DIAGNOSIS — Z79811 Long term (current) use of aromatase inhibitors: Secondary | ICD-10-CM

## 2014-07-20 DIAGNOSIS — M8580 Other specified disorders of bone density and structure, unspecified site: Secondary | ICD-10-CM

## 2014-07-20 DIAGNOSIS — Z170 Estrogen receptor positive status [ER+]: Secondary | ICD-10-CM

## 2014-07-20 DIAGNOSIS — C50311 Malignant neoplasm of lower-inner quadrant of right female breast: Secondary | ICD-10-CM

## 2014-07-20 LAB — COMPREHENSIVE METABOLIC PANEL (CC13)
ALBUMIN: 3.9 g/dL (ref 3.5–5.0)
ALT: 10 U/L (ref 0–55)
ANION GAP: 10 meq/L (ref 3–11)
AST: 11 U/L (ref 5–34)
Alkaline Phosphatase: 84 U/L (ref 40–150)
BUN: 14.4 mg/dL (ref 7.0–26.0)
CO2: 29 meq/L (ref 22–29)
Calcium: 9.9 mg/dL (ref 8.4–10.4)
Chloride: 104 mEq/L (ref 98–109)
Creatinine: 0.9 mg/dL (ref 0.6–1.1)
EGFR: 76 mL/min/{1.73_m2} — AB (ref >90)
Glucose: 99 mg/dl (ref 70–140)
POTASSIUM: 4 meq/L (ref 3.5–5.1)
SODIUM: 143 meq/L (ref 136–145)
TOTAL PROTEIN: 7.3 g/dL (ref 6.4–8.3)
Total Bilirubin: 1.49 mg/dL — ABNORMAL HIGH (ref 0.20–1.20)

## 2014-07-20 LAB — CBC WITH DIFFERENTIAL/PLATELET
BASO%: 0.3 % (ref 0.0–2.0)
BASOS ABS: 0 10*3/uL (ref 0.0–0.1)
EOS%: 2.5 % (ref 0.0–7.0)
Eosinophils Absolute: 0.2 10*3/uL (ref 0.0–0.5)
HCT: 37.8 % (ref 34.8–46.6)
HEMOGLOBIN: 12.2 g/dL (ref 11.6–15.9)
LYMPH%: 33.2 % (ref 14.0–49.7)
MCH: 29 pg (ref 25.1–34.0)
MCHC: 32.3 g/dL (ref 31.5–36.0)
MCV: 89.8 fL (ref 79.5–101.0)
MONO#: 0.3 10*3/uL (ref 0.1–0.9)
MONO%: 5.6 % (ref 0.0–14.0)
NEUT#: 3.5 10*3/uL (ref 1.5–6.5)
NEUT%: 58.4 % (ref 38.4–76.8)
Platelets: 184 10*3/uL (ref 145–400)
RBC: 4.21 10*6/uL (ref 3.70–5.45)
RDW: 15 % — ABNORMAL HIGH (ref 11.2–14.5)
WBC: 6 10*3/uL (ref 3.9–10.3)
lymph#: 2 10*3/uL (ref 0.9–3.3)

## 2014-07-20 NOTE — Progress Notes (Signed)
[place]  [phone] [phone]  OFFICE PROGRESS NOTE   PCP:  Stephens Shire, M.D. SU: Neldon Mc, M.D. RAD ONC: Arloa Koh, M.D.   DIAGNOSIS: Mrs. 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   PRIOR THERAPY: #1 The patient was originally seen at the multidisciplinary breast clinic on 08/05/2011 for a stage IIB, invasive ductal carcinoma (T3 NX MX).   #2  Status post right simple mastectomy with right axillary lymph node resection on 08/14/2011 with the final pathology revealing invasive and in situ ductal carcinoma. Two nodules were seen 3 cm and 2.2 cm.  Margins were negative.  There was focal lymphovascular space involvement by tumor. 1/15 sentinel lymph nodes were positive for metastatic disease.  The tumor was estrogen receptor positive 100%,  progesterone receptor positive 100%,  and HER-2/neu negative-no amplification by CISH with a ratio of 1.09, proliferation marker 53%. Final pathologic staging was stage IIB, pT2 pN1a, pMX.    #3.  S/P Adjuvant chemotherapy with Taxotere/Cytoxan  Q3 weeks beginning on 09/22/2011.  The patient is a participant in NSBP B-49 protocol.  She completed total of 6 cycles of Taxotere/Cytoxan given from 09/22/2011 -  01/05/2012.  #4   Status post radiation therapy to the right chest wall and axilla completed on 03/31/2012.  #5  She began adjuvant antiestrogen therapy with Arimidex 1 mg by mouth daily in 03/2012.  A total of 5 years of therapy is planned.  #6   bone density scan:  in 03/2012 showed a T score of 1.1 (normal).   CURRENT THERAPY: Arimidex 1 mg daily starting in 03/2012.   INTERVAL HISTORY:   Mrs.Cora A Quintanilla 64 y.o. female was seen today for followup of right breast invasive and in situ ductal carcinoma. Lydiann is doing well today.  She is taking Arimidex daily and is  tolerating it moderately well.   She has mild hot flush, she has chronic knee pain, and she get cortisone shots sometime with relives, she otherwise is doing well. Good appetite and no weight loss. No ED visit or hospitalization in the past 6 month.    She had mammogram last week and was recalled, she is gong back next week.   MEDICAL HISTORY: Past Medical History  Diagnosis Date  . Hypertension   . Asthma   . Hyperlipemia   . Heart murmur   . Arthritis   . OSA (obstructive sleep apnea)     CPAP settings- ?   . Blood transfusion     hx of last one in 1987  . Cancer of lower-inner quadrant of female breast 07/24/2011    right breast cancer /IDC,stage IIB,er/pr=+,her2=Neg  . MRSA (methicillin resistant staph aureus) culture positive     08/2011  . Breast cancer   . Lymphedema     right arm wears a sleeve  . History of chemotherapy     taxotere/cytoxan 6 cycles 09/22/11-01/05/12 day 2 neulasta  . Neuromuscular disorder     peripheral neuropathy hands feet  . 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 Christella Noa follows  . Glaucoma     ALLERGIES:  is allergic to trandolapril-verapamil hcl er.   SURGICAL HISTORY:  Past Surgical History  Procedure Laterality Date  . Tubal ligation    . Dilation and curettage of uterus      x3  . Carpal tunnel release  Right     2012  . Cholecystectomy  1987    open - Dr Lindon Romp  . Mastectomy modified radical  08/14/11    right , ER/PR +, HER2 -  . Abdominal hysterectomy  2007    TAH/BSO  . Breast surgery      mastectomy RIGHT  . Portacath placement  09/11/2011    Procedure: INSERTION PORT-A-CATH;  Surgeon: Joyice Faster. Cornett, MD;  Location: [facility];  Service: General;  Laterality: N/A;  Insert of Port  . Port-a-cath removal  06/22/2012    Procedure: MINOR REMOVAL PORT-A-CATH;  Surgeon: Haywood Lasso, MD;  Location: [place];  Service: General;  Laterality: Left;  Marland Kitchen Eye surgery      laser eye  surgery for glaucoma  . Port-a-cath insertion      port-a-cath removal 1'14  . Colonoscopy N/A 06/13/2013    Procedure: COLONOSCOPY;  Surgeon: Juanita Craver, MD;  Location: [facility];  Service: Endoscopy;  Laterality: N/A;    REVIEW OF SYSTEMS:   A 10 point review of systems was conducted and is otherwise negative except for what is noted above.    Health Maintenance  Mammogram: 07/11/2013 Colonoscopy: 05/2013 with Dr. Collene Mares, f/u in 5 years was recommended Bone Density Scan: 04/21/2012 normal Pap Smear: due in 01/2014, done by Dr. Leonie Douglas Exam: 01/18/2014 Vitamin D Level: followed by Dr. Rica Mote Lipid Panel: 05/2013, on Zocor, followed by PCP   PHYSICAL EXAMINATION:  BP 163/75 mmHg  Pulse 71  Temp(Src) 98.6 F (37 C) (Oral)  Resp 18  Ht 5' 2"  (1.575 m)  Wt 295 lb 9.6 oz (134.083 kg)  BMI 54.05 kg/m2  SpO2 100%  GENERAL: Patient is a well appearing female in no acute distress HEENT:  Sclerae anicteric.  Oropharynx clear and moist. No ulcerations or evidence of oropharyngeal candidiasis. Neck is supple.  NODES:  No cervical, supraclavicular, or axillary lymphadenopathy palpated.  BREAST EXAM:  S/p right mastectomy, no nodularity, left breast without masses or nodules, benign bilateral breast exam.   LUNGS:  Clear to auscultation bilaterally.  No wheezes or rhonchi. HEART:  Regular rate and rhythm. No murmur appreciated. ABDOMEN:  Soft, nontender.  Positive, normoactive bowel sounds. No organomegaly palpated. MSK:  No focal spinal tenderness to palpation. Full range of motion bilaterally in the upper extremities. EXTREMITIES:  No peripheral edema.  Right arm with lymphedema sleeve SKIN:  Clear with no obvious rashes or skin changes. No nail dyscrasia. NEURO:  Nonfocal. Well oriented.  Appropriate affect.  ECOG PERFORMANCE STATUS: 1 - Symptomatic but completely ambulatory  LABORATORY DATA:  RADIOLOGY: Mammogram 2.19.2016 FINDINGS: In the left breast, a possible mass  warrants further evaluation with spot compression views and possibly ultrasound. In the right breast, no findings suspicious for malignancy.  Images were processed with CAD.  IMPRESSION: Further evaluation is suggested for possible mass in the left breast.  ASSESSMENT: Mrs. Wrightsman is a 64 year-old female:  1. Right breast IDA, stage IIB, pT2 pN1a, estrogen receptor positive 100%,  progesterone receptor positive 100%,  and HER-2/neu negative - She is clinically doing very well without evidence of cancer recurrence. -Her recent screening mammogram showed a possible left breast mass. She is going to have additional image next week. She will call me if biopsy is done. -She will continue Arimidex, she is tolerating well, a total of 5 years. -I'll reschedule her bone density scan, she is due -I encouraged her to have healthy diet, exercise regularly, and take calcium  and vitamin D for bone health. -She is also being follow-up on the study of NSABP B49.  She will continue follow-up with her primary care physician for other medical issues.  Elonna will return in 6 months for labs and f/u.    All the information above was reviewed in detail with Alishea and she is in agreement with the above plan.  All questions were answered.  Mrs. Busby encouraged to contact us in the interim if she has any problems, questions or concerns.   Truitt Merle  07/20/2014

## 2014-07-20 NOTE — Addendum Note (Signed)
Addended by: Truitt Merle on: 07/20/2014 12:35 PM   Modules accepted: Orders

## 2014-07-20 NOTE — Telephone Encounter (Signed)
Pt confirmed labs/ov per 02/25 POF, gave pt AVS.... KJ

## 2014-07-25 ENCOUNTER — Other Ambulatory Visit: Payer: Self-pay | Admitting: Obstetrics and Gynecology

## 2014-07-25 ENCOUNTER — Ambulatory Visit
Admission: RE | Admit: 2014-07-25 | Discharge: 2014-07-25 | Disposition: A | Discharge status: Home / Self Care | Payer: Medicare Other | Source: Ambulatory Visit | Attending: Obstetrics and Gynecology | Admitting: Obstetrics and Gynecology

## 2014-07-25 DIAGNOSIS — R928 Other abnormal and inconclusive findings on diagnostic imaging of breast: Secondary | ICD-10-CM

## 2014-08-03 ENCOUNTER — Ambulatory Visit
Admission: RE | Admit: 2014-08-03 | Discharge: 2014-08-03 | Disposition: A | Discharge status: Home / Self Care | Payer: Medicare Other | Source: Ambulatory Visit | Attending: Hematology | Admitting: Hematology

## 2014-08-03 DIAGNOSIS — E2839 Other primary ovarian failure: Secondary | ICD-10-CM

## 2014-09-25 ENCOUNTER — Other Ambulatory Visit: Payer: Self-pay | Admitting: Oncology

## 2014-11-03 ENCOUNTER — Ambulatory Visit (INDEPENDENT_AMBULATORY_CARE_PROVIDER_SITE_OTHER): Payer: Medicare Other | Admitting: Pulmonary Disease

## 2014-11-03 ENCOUNTER — Encounter: Payer: Self-pay | Admitting: Pulmonary Disease

## 2014-11-03 VITALS — BP 144/72 | HR 79 | Temp 97.0°F | Ht 62.0 in | Wt 295.0 lb

## 2014-11-03 DIAGNOSIS — G4733 Obstructive sleep apnea (adult) (pediatric): Secondary | ICD-10-CM

## 2014-11-03 NOTE — Progress Notes (Signed)
   Subjective:    Patient ID: [name], female    DOB: [birth year], 64 y.o.   MRN: [id number]  HPI The patient comes in today for follow-up of her obstructive sleep apnea. She is wearing C Pap compliantly, and is having no issues with her mask fit or pressure. She tells me that she sleeps well with the device, and is satisfied with her daytime alertness. Her weight is stable over the last year.   Review of Systems  Constitutional: Negative for fever and unexpected weight change.  HENT: Negative for congestion, dental problem, ear pain, nosebleeds, postnasal drip, rhinorrhea, sinus pressure, sneezing, sore throat and trouble swallowing.   Eyes: Negative for redness and itching.  Respiratory: Negative for cough, chest tightness, shortness of breath and wheezing.   Cardiovascular: Negative for palpitations and leg swelling.  Gastrointestinal: Negative for nausea and vomiting.  Genitourinary: Negative for dysuria.  Musculoskeletal: Negative for joint swelling.  Skin: Negative for rash.  Neurological: Negative for headaches.  Hematological: Does not bruise/bleed easily.  Psychiatric/Behavioral: Negative for dysphoric mood. The patient is not nervous/anxious.        Objective:   Physical Exam Obese female in no acute distress Nose without purulence or discharge noted Neck without lymphadenopathy or thyromegaly No skin breakdown or pressure necrosis from the sleep apnea mask Lower extremities with mild edema, no cyanosis Alert and oriented, moves all 4 extremities.       Assessment & Plan:

## 2014-11-03 NOTE — Assessment & Plan Note (Signed)
The patient is wearing C Pap compliantly, and feels that it continues to help her sleep and daytime alertness. She is having no issues with her mask fit or pressure, and has been keeping up with her supplies. I've asked her to continue working on weight loss.

## 2014-11-03 NOTE — Patient Instructions (Signed)
Continue on cpap, and keep up with mask changes and supplies. Keep working on weight loss followup with Dr. Sood in one year.  

## 2014-11-24 ENCOUNTER — Ambulatory Visit: Payer: Medicare Other | Admitting: Pulmonary Disease

## 2014-12-21 ENCOUNTER — Other Ambulatory Visit: Payer: Self-pay | Admitting: Hematology

## 2014-12-21 DIAGNOSIS — C50311 Malignant neoplasm of lower-inner quadrant of right female breast: Secondary | ICD-10-CM

## 2014-12-26 ENCOUNTER — Other Ambulatory Visit: Payer: Self-pay | Admitting: Hematology

## 2014-12-26 DIAGNOSIS — N6320 Unspecified lump in the left breast, unspecified quadrant: Secondary | ICD-10-CM

## 2015-01-18 ENCOUNTER — Other Ambulatory Visit (HOSPITAL_BASED_OUTPATIENT_CLINIC_OR_DEPARTMENT_OTHER): Payer: Medicare Other

## 2015-01-18 ENCOUNTER — Encounter: Payer: Self-pay | Admitting: Hematology

## 2015-01-18 ENCOUNTER — Ambulatory Visit (HOSPITAL_BASED_OUTPATIENT_CLINIC_OR_DEPARTMENT_OTHER): Payer: Medicare Other | Admitting: Hematology

## 2015-01-18 ENCOUNTER — Telehealth: Payer: Self-pay | Admitting: Hematology

## 2015-01-18 VITALS — BP 149/72 | HR 69 | Temp 97.7°F | Resp 18 | Ht 62.0 in | Wt 289.6 lb

## 2015-01-18 DIAGNOSIS — C50311 Malignant neoplasm of lower-inner quadrant of right female breast: Secondary | ICD-10-CM | POA: Diagnosis present

## 2015-01-18 DIAGNOSIS — M8580 Other specified disorders of bone density and structure, unspecified site: Secondary | ICD-10-CM | POA: Diagnosis not present

## 2015-01-18 DIAGNOSIS — Z170 Estrogen receptor positive status [ER+]: Secondary | ICD-10-CM | POA: Diagnosis not present

## 2015-01-18 DIAGNOSIS — C773 Secondary and unspecified malignant neoplasm of axilla and upper limb lymph nodes: Secondary | ICD-10-CM | POA: Diagnosis not present

## 2015-01-18 DIAGNOSIS — E559 Vitamin D deficiency, unspecified: Secondary | ICD-10-CM

## 2015-01-18 DIAGNOSIS — E2839 Other primary ovarian failure: Secondary | ICD-10-CM

## 2015-01-18 LAB — CBC WITH DIFFERENTIAL/PLATELET
BASO%: 0.8 % (ref 0.0–2.0)
Basophils Absolute: 0 10*3/uL (ref 0.0–0.1)
EOS ABS: 0.2 10*3/uL (ref 0.0–0.5)
EOS%: 3.7 % (ref 0.0–7.0)
HEMATOCRIT: 37.9 % (ref 34.8–46.6)
HGB: 12.6 g/dL (ref 11.6–15.9)
LYMPH#: 1.8 10*3/uL (ref 0.9–3.3)
LYMPH%: 32.9 % (ref 14.0–49.7)
MCH: 29.6 pg (ref 25.1–34.0)
MCHC: 33.1 g/dL (ref 31.5–36.0)
MCV: 89.4 fL (ref 79.5–101.0)
MONO#: 0.4 10*3/uL (ref 0.1–0.9)
MONO%: 7.1 % (ref 0.0–14.0)
NEUT#: 3 10*3/uL (ref 1.5–6.5)
NEUT%: 55.5 % (ref 38.4–76.8)
PLATELETS: 180 10*3/uL (ref 145–400)
RBC: 4.24 10*6/uL (ref 3.70–5.45)
RDW: 15.3 % — ABNORMAL HIGH (ref 11.2–14.5)
WBC: 5.4 10*3/uL (ref 3.9–10.3)

## 2015-01-18 LAB — COMPREHENSIVE METABOLIC PANEL (CC13)
ALBUMIN: 4.1 g/dL (ref 3.5–5.0)
ALK PHOS: 90 U/L (ref 40–150)
ALT: 14 U/L (ref 0–55)
ANION GAP: 12 meq/L — AB (ref 3–11)
AST: 14 U/L (ref 5–34)
BUN: 17.6 mg/dL (ref 7.0–26.0)
CALCIUM: 10.2 mg/dL (ref 8.4–10.4)
CHLORIDE: 103 meq/L (ref 98–109)
CO2: 29 mEq/L (ref 22–29)
Creatinine: 0.9 mg/dL (ref 0.6–1.1)
EGFR: 74 mL/min/{1.73_m2} — AB (ref >90)
Glucose: 99 mg/dl (ref 70–140)
POTASSIUM: 3.8 meq/L (ref 3.5–5.1)
Sodium: 144 mEq/L (ref 136–145)
Total Bilirubin: 1.4 mg/dL — ABNORMAL HIGH (ref 0.20–1.20)
Total Protein: 7.7 g/dL (ref 6.4–8.3)

## 2015-01-18 NOTE — Progress Notes (Addendum)
[place]  [phone]) [phone] [phone]  OFFICE PROGRESS NOTE   PCP:  Stephens Shire, M.D. SU: Neldon Mc, M.D. RAD ONC: Arloa Koh, M.D.   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   PRIOR THERAPY: #1 The patient was originally seen at the multidisciplinary breast clinic on 08/05/2011 for a stage IIB, invasive ductal carcinoma (T3 NX MX).   #2  Status post right simple mastectomy with right axillary lymph node resection on 08/14/2011 with the final pathology revealing invasive and in situ ductal carcinoma. Two nodules were seen 3 cm and 2.2 cm.  Margins were negative.  There was focal lymphovascular space involvement by tumor. 1/15 sentinel lymph nodes were positive for metastatic disease.  The tumor was estrogen receptor positive 100%,  progesterone receptor positive 100%,  and HER-2/neu negative-no amplification by CISH with a ratio of 1.09, proliferation marker 53%. Final pathologic staging was stage IIB, pT2 pN1a, pMX.    #3.  S/P Adjuvant chemotherapy with Taxotere/Cytoxan  Q3 weeks beginning on 09/22/2011.  The patient is a participant in NSBP B-49 protocol.  She completed total of 6 cycles of Taxotere/Cytoxan given from 09/22/2011 -  01/05/2012.  #4   Status post radiation therapy to the right chest wall and axilla completed on 03/31/2012.  #5  She began adjuvant antiestrogen therapy with Arimidex 1 mg by mouth daily in 03/2012.  A total of 5 years of therapy is planned.  #6   bone density scan:  in 03/2012 showed a T score of 1.1 (normal).   CURRENT THERAPY: Arimidex 1 mg daily starting in 03/2012.   INTERVAL HISTORY:   [name] 64 y.o. female returns for follow up. She has been doing well, mild hot flush, manageable, no significant joint or muscular discomfort. She has started exercise  regularly in the poor, and lost a few pounds since last year. She reports mild intermittent tenderness at the right rib cage, below the surgical scar.  MEDICAL HISTORY: Past Medical History  Diagnosis Date  . Hypertension   . Asthma   . Hyperlipemia   . Heart murmur   . Arthritis   . OSA (obstructive sleep apnea)     CPAP settings- ?   . Blood transfusion     hx of last one in 1987  . Cancer of lower-inner quadrant of female breast 07/24/2011    right breast cancer /IDC,stage IIB,er/pr=+,her2=Neg  . MRSA (methicillin resistant staph aureus) culture positive     08/2011  . Breast cancer   . Lymphedema     right arm wears a sleeve  . History of chemotherapy     taxotere/cytoxan 6 cycles 09/22/11-01/05/12 day 2 neulasta  . Neuromuscular disorder     peripheral neuropathy hands feet  . 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 Christella Noa follows  . Glaucoma     ALLERGIES:  is allergic to trandolapril-verapamil hcl er.   SURGICAL HISTORY:  Past Surgical History  Procedure Laterality Date  . Tubal ligation    . Dilation and curettage of uterus      x3  . Carpal tunnel release Right     2012  . Cholecystectomy  1987    open - Dr Lindon Romp  . Mastectomy modified radical  08/14/11    right , ER/PR +, HER2 -  . Abdominal hysterectomy  2007  TAH/BSO  . Breast surgery      mastectomy RIGHT  . Portacath placement  09/11/2011    Procedure: INSERTION PORT-A-CATH;  Surgeon: Joyice Faster. Cornett, MD;  Location: [facility];  Service: General;  Laterality: N/A;  Insert of Port  . Port-a-cath removal  06/22/2012    Procedure: MINOR REMOVAL PORT-A-CATH;  Surgeon: Haywood Lasso, MD;  Location: [place];  Service: General;  Laterality: Left;  Marland Kitchen Eye surgery      laser eye surgery for glaucoma  . Port-a-cath insertion      port-a-cath removal 1'14  . Colonoscopy N/A 06/13/2013    Procedure: COLONOSCOPY;  Surgeon: Juanita Craver, MD;  Location: [facility];  Service: Endoscopy;  Laterality: N/A;    REVIEW OF SYSTEMS:   A 10 point review of systems was conducted and is otherwise negative except for what is noted above.    Health Maintenance  Mammogram: 07/25/2014 Colonoscopy: 05/2013 with Dr. Collene Mares, f/u in 5 years was recommended Bone Density Scan: 04/21/2012 normal Pap Smear: due in 01/2014, done by Dr. Leonie Douglas Exam: 01/18/2014 Vitamin D Level: followed by Dr. Rica Mote Lipid Panel: 05/2013, on Zocor, followed by PCP   PHYSICAL EXAMINATION:  BP 149/72 mmHg  Pulse 69  Temp(Src) 97.7 F (36.5 C) (Oral)  Resp 18  Ht 5' 2"  (1.575 m)  Wt 289 lb 9.6 oz (131.362 kg)  BMI 52.96 kg/m2  SpO2 95%  GENERAL: Patient is a well appearing female in no acute distress HEENT:  Sclerae anicteric.  Oropharynx clear and moist. No ulcerations or evidence of oropharyngeal candidiasis. Neck is supple.  NODES:  No cervical, supraclavicular, or axillary lymphadenopathy palpated.  BREAST EXAM:  S/p right mastectomy, no nodularity, no tenderness at the right rib cage. left breast without masses or nodules, axillary no palpable nodes   LUNGS:  Clear to auscultation bilaterally.  No wheezes or rhonchi. HEART:  Regular rate and rhythm. No murmur appreciated. ABDOMEN:  Soft, nontender.  Positive, normoactive bowel sounds. No organomegaly palpated. MSK:  No focal spinal tenderness to palpation. Full range of motion bilaterally in the upper extremities. EXTREMITIES:  No peripheral edema.  Right arm with lymphedema sleeve SKIN:  Clear with no obvious rashes or skin changes. No nail dyscrasia. NEURO:  Nonfocal. Well oriented.  Appropriate affect.  ECOG PERFORMANCE STATUS: 1 - Symptomatic but completely ambulatory  LABORATORY DATA:  RADIOLOGY: Mammogram 2.19.2016 FINDINGS: In the left breast, a possible mass warrants further evaluation with spot compression views and possibly ultrasound. In the right breast, no findings suspicious for  malignancy.  Images were processed with CAD.  IMPRESSION: Further evaluation is suggested for possible mass in the left breast.  Bone density scan 07/30/2014 FINDINGS: LEFT FOREARM (1/3 RADIUS)  Bone Mineral Density (BMD): 0.623  Young Adult T Score: -1.2  Z Score: 0.5  ASSESSMENT: Patient's diagnostic category is LOW BONE MASS by WHO Criteria.   ASSESSMENT: [name] is a 64 year-old female:  1. Right breast IDA, stage IIB, pT2 pN1a, estrogen receptor positive 100%,  progesterone receptor positive 100%,  and HER-2/neu negative - She is clinically doing very well without evidence of cancer recurrence. -Her recent screening mammogram showed a possible left breast mass. She is going to have additional image next week (was rescheduled). She will call me if biopsy is done. -She will continue Arimidex, she is tolerating well. We discussed the last of adjuvant Arimidex, new clinical check chart data supports last recurrence with extended 10 years of AI,  she is interested. If she continues to tolerate it well, we'll plan for 10 years Arimidex. -I encouraged her to have healthy diet, exercise regularly, and take calcium and vitamin D for bone health. -She is also being follow-up on the study of NSABP B49.  2. Osteopenia -Her bone density scan from March 2016 showed osteopenia, she has normal bone density in 2013 -We discussed that Arimidex may wake her bone.  -I encouraged her to take calcium 1g and vitamin D thousand units daily -will check her vitamin D level on next visit.  She will continue follow-up with her primary care physician for other medical issues.  [name] will return in 6 months for labs and f/u.    All the information above was reviewed in detail with [name] and she is in agreement with the above plan.  All questions were answered.  [name] encouraged to contact us in the interim if she has any problems, questions or concerns.   Truitt Merle  01/18/2015

## 2015-01-18 NOTE — Addendum Note (Signed)
Addended by: Truitt Merle on: 01/18/2015 09:38 AM   Modules accepted: Orders

## 2015-01-18 NOTE — Addendum Note (Signed)
Addended by: Truitt Merle on: 01/18/2015 09:47 AM   Modules accepted: Orders

## 2015-01-18 NOTE — Telephone Encounter (Signed)
per pof to sch pt appt-gave pt copy of avs °

## 2015-01-26 ENCOUNTER — Ambulatory Visit
Admission: RE | Admit: 2015-01-26 | Discharge: 2015-01-26 | Disposition: A | Discharge status: Home / Self Care | Payer: Medicare Other | Source: Ambulatory Visit | Attending: Hematology | Admitting: Hematology

## 2015-01-26 DIAGNOSIS — N6320 Unspecified lump in the left breast, unspecified quadrant: Secondary | ICD-10-CM

## 2015-02-27 ENCOUNTER — Other Ambulatory Visit: Payer: Self-pay | Admitting: General Surgery

## 2015-04-24 ENCOUNTER — Other Ambulatory Visit: Payer: Self-pay | Admitting: Neurosurgery

## 2015-04-24 DIAGNOSIS — D352 Benign neoplasm of pituitary gland: Secondary | ICD-10-CM

## 2015-05-11 ENCOUNTER — Ambulatory Visit
Admission: RE | Admit: 2015-05-11 | Discharge: 2015-05-11 | Disposition: A | Discharge status: Home / Self Care | Payer: Medicare Other | Source: Ambulatory Visit | Attending: Neurosurgery | Admitting: Neurosurgery

## 2015-05-11 DIAGNOSIS — D352 Benign neoplasm of pituitary gland: Secondary | ICD-10-CM

## 2015-05-11 MED ORDER — GADOBENATE DIMEGLUMINE 529 MG/ML IV SOLN
10.0000 mL | Freq: Once | INTRAVENOUS | Status: AC | PRN
  Administered 2015-05-11: 10 mL via INTRAVENOUS

## 2015-06-15 ENCOUNTER — Other Ambulatory Visit: Payer: Self-pay | Admitting: Hematology

## 2015-06-15 DIAGNOSIS — N6320 Unspecified lump in the left breast, unspecified quadrant: Secondary | ICD-10-CM

## 2015-07-20 ENCOUNTER — Telehealth: Payer: Self-pay | Admitting: Hematology

## 2015-07-20 NOTE — Telephone Encounter (Signed)
Due to YF out moved 2/27 lab/fu to 3/21 @ 9:15 am. Spoke with patient she is aware and has new date/time.

## 2015-07-23 ENCOUNTER — Ambulatory Visit: Payer: Medicare Other | Admitting: Hematology

## 2015-07-23 ENCOUNTER — Other Ambulatory Visit: Payer: Medicare Other

## 2015-07-31 ENCOUNTER — Other Ambulatory Visit: Payer: Self-pay | Admitting: Hematology

## 2015-07-31 ENCOUNTER — Ambulatory Visit
Admission: RE | Admit: 2015-07-31 | Discharge: 2015-07-31 | Disposition: A | Discharge status: Home / Self Care | Payer: Medicare Other | Source: Ambulatory Visit | Attending: Hematology | Admitting: Hematology

## 2015-07-31 DIAGNOSIS — N6320 Unspecified lump in the left breast, unspecified quadrant: Secondary | ICD-10-CM

## 2015-08-07 ENCOUNTER — Other Ambulatory Visit: Payer: Self-pay | Admitting: *Deleted

## 2015-08-07 DIAGNOSIS — C50311 Malignant neoplasm of lower-inner quadrant of right female breast: Secondary | ICD-10-CM

## 2015-08-07 MED ORDER — ANASTROZOLE 1 MG PO TABS: 1.0000 mg | ORAL_TABLET | Freq: Every day | ORAL | Status: DC

## 2015-08-14 ENCOUNTER — Other Ambulatory Visit (HOSPITAL_BASED_OUTPATIENT_CLINIC_OR_DEPARTMENT_OTHER): Payer: Medicare Other

## 2015-08-14 ENCOUNTER — Ambulatory Visit (HOSPITAL_BASED_OUTPATIENT_CLINIC_OR_DEPARTMENT_OTHER): Payer: Medicare Other | Admitting: Hematology

## 2015-08-14 ENCOUNTER — Encounter: Payer: Self-pay | Admitting: Hematology

## 2015-08-14 ENCOUNTER — Telehealth: Payer: Self-pay | Admitting: Hematology

## 2015-08-14 VITALS — BP 168/81 | HR 69 | Temp 97.5°F | Resp 17 | Ht 62.0 in | Wt 284.0 lb

## 2015-08-14 DIAGNOSIS — E876 Hypokalemia: Secondary | ICD-10-CM | POA: Diagnosis not present

## 2015-08-14 DIAGNOSIS — C50911 Malignant neoplasm of unspecified site of right female breast: Secondary | ICD-10-CM

## 2015-08-14 DIAGNOSIS — Z170 Estrogen receptor positive status [ER+]: Secondary | ICD-10-CM | POA: Diagnosis not present

## 2015-08-14 DIAGNOSIS — C773 Secondary and unspecified malignant neoplasm of axilla and upper limb lymph nodes: Secondary | ICD-10-CM

## 2015-08-14 DIAGNOSIS — E559 Vitamin D deficiency, unspecified: Secondary | ICD-10-CM

## 2015-08-14 DIAGNOSIS — C50311 Malignant neoplasm of lower-inner quadrant of right female breast: Secondary | ICD-10-CM

## 2015-08-14 DIAGNOSIS — M8580 Other specified disorders of bone density and structure, unspecified site: Secondary | ICD-10-CM

## 2015-08-14 LAB — COMPREHENSIVE METABOLIC PANEL
ALT: 14 U/L (ref 0–55)
ANION GAP: 10 meq/L (ref 3–11)
AST: 14 U/L (ref 5–34)
Albumin: 3.9 g/dL (ref 3.5–5.0)
Alkaline Phosphatase: 75 U/L (ref 40–150)
BUN: 15.2 mg/dL (ref 7.0–26.0)
CALCIUM: 9.6 mg/dL (ref 8.4–10.4)
CHLORIDE: 105 meq/L (ref 98–109)
CO2: 30 meq/L — AB (ref 22–29)
CREATININE: 0.9 mg/dL (ref 0.6–1.1)
EGFR: 73 mL/min/{1.73_m2} — ABNORMAL LOW (ref >90)
Glucose: 107 mg/dl (ref 70–140)
POTASSIUM: 3.4 meq/L — AB (ref 3.5–5.1)
Sodium: 144 mEq/L (ref 136–145)
Total Bilirubin: 1.31 mg/dL — ABNORMAL HIGH (ref 0.20–1.20)
Total Protein: 7.4 g/dL (ref 6.4–8.3)

## 2015-08-14 LAB — CBC WITH DIFFERENTIAL/PLATELET
BASO%: 0.4 % (ref 0.0–2.0)
BASOS ABS: 0 10*3/uL (ref 0.0–0.1)
EOS%: 4.2 % (ref 0.0–7.0)
Eosinophils Absolute: 0.2 10*3/uL (ref 0.0–0.5)
HCT: 36.5 % (ref 34.8–46.6)
HGB: 12 g/dL (ref 11.6–15.9)
LYMPH%: 37.3 % (ref 14.0–49.7)
MCH: 29.1 pg (ref 25.1–34.0)
MCHC: 32.9 g/dL (ref 31.5–36.0)
MCV: 88.4 fL (ref 79.5–101.0)
MONO#: 0.3 10*3/uL (ref 0.1–0.9)
MONO%: 5.8 % (ref 0.0–14.0)
NEUT#: 2.6 10*3/uL (ref 1.5–6.5)
NEUT%: 52.3 % (ref 38.4–76.8)
PLATELETS: 153 10*3/uL (ref 145–400)
RBC: 4.13 10*6/uL (ref 3.70–5.45)
RDW: 14.2 % (ref 11.2–14.5)
WBC: 5 10*3/uL (ref 3.9–10.3)
lymph#: 1.9 10*3/uL (ref 0.9–3.3)

## 2015-08-14 MED ORDER — POTASSIUM CHLORIDE CRYS ER 20 MEQ PO TBCR: 20.0000 meq | EXTENDED_RELEASE_TABLET | Freq: Two times a day (BID) | ORAL | Status: DC

## 2015-08-14 NOTE — Progress Notes (Signed)
[place]  Telephone:(336) [phone] [phone]  OFFICE PROGRESS NOTE   PCP:  Stephens Shire, M.D. SU: Neldon Mc, M.D. RAD ONC: Arloa Koh, M.D.   DIAGNOSIS: 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   PRIOR THERAPY: #1 The patient was originally seen at the multidisciplinary breast clinic on 08/05/2011 for a stage IIB, invasive ductal carcinoma (T3 NX MX).   #2  Status post right simple mastectomy with right axillary lymph node resection on 08/14/2011 with the final pathology revealing invasive and in situ ductal carcinoma. Two nodules were seen 3 cm and 2.2 cm.  Margins were negative.  There was focal lymphovascular space involvement by tumor. 1/15 sentinel lymph nodes were positive for metastatic disease.  The tumor was estrogen receptor positive 100%,  progesterone receptor positive 100%,  and HER-2/neu negative-no amplification by CISH with a ratio of 1.09, proliferation marker 53%. Final pathologic staging was stage IIB, pT2 pN1a, pMX.    #3.  S/P Adjuvant chemotherapy with Taxotere/Cytoxan  Q3 weeks beginning on 09/22/2011.  The patient is a participant in NSBP B-49 protocol.  She completed total of 6 cycles of Taxotere/Cytoxan given from 09/22/2011 -  01/05/2012.  #4   Status post radiation therapy to the right chest wall and axilla completed on 03/31/2012.  #5  She began adjuvant antiestrogen therapy with Arimidex 1 mg by mouth daily in 03/2012.  A total of 5 years of therapy is planned.  #6   bone density scan:  in 03/2012 showed a T score of 1.1 (normal).   CURRENT THERAPY: Arimidex 1 mg daily starting in 03/2012.   INTERVAL HISTORY:   [name] 66 y.o. female returns for follow up. She is doing well overall, mild hot flush at night, manageable. She has mild righ tknee pain, which is stable overall,she takes  aleve sometime, no complains. She is retired, goes to [employer] three times a week  For exercise  In the pool, walking, etc.  She feels well overall. No other new complaints.   MEDICAL HISTORY: Past Medical History  Diagnosis Date  . Hypertension   . Asthma   . Hyperlipemia   . Heart murmur   . Arthritis   . OSA (obstructive sleep apnea)     CPAP settings- ?   . Blood transfusion     hx of last one in 1987  . Cancer of lower-inner quadrant of female breast ([place]) 07/24/2011    right breast cancer /IDC,stage IIB,er/pr=+,her2=Neg  . MRSA (methicillin resistant staph aureus) culture positive     08/2011  . Breast cancer ([place])   . Lymphedema     right arm wears a sleeve  . History of chemotherapy     taxotere/cytoxan 6 cycles 09/22/11-01/05/12 day 2 neulasta  . Neuromuscular disorder ([place])     peripheral neuropathy hands feet  . Pituitary abnormality ([place]) 05-25-13    small pituitary growth-appears stable- Dr. Christella Noa follows  . Glaucoma     ALLERGIES:  is allergic to trandolapril-verapamil hcl er.   SURGICAL HISTORY:  Past Surgical History  Procedure Laterality Date  . Tubal ligation    . Dilation and curettage of uterus      x3  . Carpal tunnel release Right     2012  . Cholecystectomy  1987    open - Dr Lindon Romp  . Mastectomy modified radical  08/14/11  right , ER/PR +, HER2 -  . Abdominal hysterectomy  2007    TAH/BSO  . Breast surgery      mastectomy RIGHT  . Portacath placement  09/11/2011    Procedure: INSERTION PORT-A-CATH;  Surgeon: Joyice Faster. Cornett, MD;  Location: [facility];  Service: General;  Laterality: N/A;  Insert of Port  . Port-a-cath removal  06/22/2012    Procedure: MINOR REMOVAL PORT-A-CATH;  Surgeon: Haywood Lasso, MD;  Location: [place];  Service: General;  Laterality: Left;  Marland Kitchen Eye surgery      laser eye surgery for glaucoma  . Port-a-cath insertion      port-a-cath removal 1'14  . Colonoscopy N/A 06/13/2013    Procedure: COLONOSCOPY;   Surgeon: Juanita Craver, MD;  [facility];  Service: Endoscopy;  Laterality: N/A;    REVIEW OF SYSTEMS:   A 10 point review of systems was conducted and is otherwise negative except for what is noted above.    Health Maintenance  Mammogram: 07/25/2014 Colonoscopy: 05/2013 with Dr. Collene Mares, f/u in 5 years was recommended Bone Density Scan: 04/21/2012 normal Pap Smear: due in 01/2014, done by Dr. Leonie Douglas Exam: 01/18/2014 Vitamin D Level: followed by Dr. Rica Mote Lipid Panel: 05/2013, on Zocor, followed by PCP   PHYSICAL EXAMINATION:  BP 168/81 mmHg  Pulse 69  Temp(Src) 97.5 F (36.4 C) (Oral)  Resp 17  Ht 5' 2"  (1.575 m)  Wt 284 lb (128.822 kg)  BMI 51.93 kg/m2  SpO2 100% GENERAL: Patient is a well appearing female in no acute distress HEENT:  Sclerae anicteric.  Oropharynx clear and moist. No ulcerations or evidence of oropharyngeal candidiasis. Neck is supple.  NODES:  No cervical, supraclavicular, or axillary lymphadenopathy palpated.  BREAST EXAM:  S/p right mastectomy, no nodularity, no tenderness at the right rib cage. left breast without masses or nodules, axillary no palpable nodes   LUNGS:  Clear to auscultation bilaterally.  No wheezes or rhonchi. HEART:  Regular rate and rhythm. No murmur appreciated. ABDOMEN:  Soft, nontender.  Positive, normoactive bowel sounds. No organomegaly palpated. MSK:  No focal spinal tenderness to palpation. Full range of motion bilaterally in the upper extremities. EXTREMITIES:  No peripheral edema.  Right arm with lymphedema sleeve SKIN:  Clear with no obvious rashes or skin changes. No nail dyscrasia. NEURO:  Nonfocal. Well oriented.  Appropriate affect.  ECOG PERFORMANCE STATUS: 1 - Symptomatic but completely ambulatory  LABORATORY DATA:  RADIOLOGY: Mammogram 07/31/2015  FINDINGS: CC and MLO views the left breast with tomosynthesis were obtained. The circumscribed oval approximate 6 mm mass in the lower left breast, likely  with a fatty hilus on the CC tomosynthesis images, is unchanged dating back to March, 2016. No new or suspicious findings elsewhere in the left breast.  Mammographic images were processed with CAD.  IMPRESSION: Stable likely benign 6 mm intramammary lymph node in the lower left breast (without sonographic correlate) dating back to March, 2016.  RECOMMENDATION: Diagnostic left mammogram in 1 year in order to confirm 2 years of stability.  Bone density scan 07/30/2014 FINDINGS: LEFT FOREARM (1/3 RADIUS)  Bone Mineral Density (BMD): 0.623  Young Adult T Score: -1.2  Z Score: 0.5  ASSESSMENT: Patient's diagnostic category is LOW BONE MASS by WHO Criteria.   ASSESSMENT: Mrs. Elson is a 65 year-old female:  1. Right breast IDA, stage IIB, pT2 pN1a, estrogen receptor positive 100%,  progesterone receptor positive 100%,  and HER-2/neu negative - She is clinically doing very  well without evidence of cancer recurrence. -Her recent screening mammogram showed a possible left breast mass. She is going to have additional image next week (was rescheduled). She will call me if biopsy is done. -She will continue Arimidex, she is tolerating well. We discussed the duration of adjuvant Arimidex, new clinical check chart data supports last recurrence with extended 10 years of AI which showed lower cancer recurrence.  If she continues to tolerate it well, we'll plan for 10 years Arimidex, given her stage IIB disease. -I encouraged her to continue healthy diet, exercise regularly, and take calcium and vitamin D for bone health, and try to lose some weight. -She is also being follow-up on the study of NSABP B49.  2. Osteopenia -Her bone density scan from March 2016 showed osteopenia, she has normal bone density in 2013 -We discussed that Arimidex may wake her bone.  -I encouraged her to take calcium 1g and vitamin D 1000 units daily - We checked a vitamin D level today, results still  pending  3.  Hypokalemia - potassium 3.4 today,  Likely related to her Lasix (3-4 time a week) and hydrocortisone reside - I give her a prescription of potassium 20 mg equal, take once daily for 7 days then on the day of taking Lasix. - She'll follow-up with her primary care physician Dr. Tollie Pizza in April.  She will continue follow-up with her primary care physician for other medical issues.  Cayce will return in 6 months for labs and f/u.    All the information above was reviewed in detail with Brynnleigh and she is in agreement with the above plan.  All questions were answered.  [name] encouraged to contact us in the interim if she has any problems, questions or concerns.   Truitt Merle  08/14/2015

## 2015-08-14 NOTE — Telephone Encounter (Signed)
per pf to sch pt appt-gave pt copy of avs °

## 2015-08-15 LAB — VITAMIN D 25 HYDROXY (VIT D DEFICIENCY, FRACTURES): VIT D 25 HYDROXY: 50.2 ng/mL (ref 30.0–100.0)

## 2015-08-22 ENCOUNTER — Telehealth: Payer: Self-pay | Admitting: *Deleted

## 2015-08-22 NOTE — Telephone Encounter (Signed)
Called pt at home and left message on voice mail re:  Vit D level normal from labs done 08/14/15 as per Dr. Burr Medico.

## 2015-11-06 ENCOUNTER — Ambulatory Visit: Payer: Medicare Other | Admitting: Pulmonary Disease

## 2015-11-07 ENCOUNTER — Ambulatory Visit (INDEPENDENT_AMBULATORY_CARE_PROVIDER_SITE_OTHER): Payer: Medicare Other | Admitting: Pulmonary Disease

## 2015-11-07 ENCOUNTER — Encounter: Payer: Self-pay | Admitting: Pulmonary Disease

## 2015-11-07 ENCOUNTER — Ambulatory Visit: Payer: Medicare Other | Admitting: Pulmonary Disease

## 2015-11-07 VITALS — BP 128/76 | HR 60 | Ht 62.0 in | Wt 275.4 lb

## 2015-11-07 DIAGNOSIS — Z6841 Body Mass Index (BMI) 40.0 and over, adult: Secondary | ICD-10-CM | POA: Diagnosis not present

## 2015-11-07 DIAGNOSIS — Z9989 Dependence on other enabling machines and devices: Principal | ICD-10-CM

## 2015-11-07 DIAGNOSIS — G4733 Obstructive sleep apnea (adult) (pediatric): Secondary | ICD-10-CM

## 2015-11-07 NOTE — Progress Notes (Signed)
Current Outpatient Prescriptions on File Prior to Visit  Medication Sig  . albuterol (PROAIR HFA) 108 (90 BASE) MCG/ACT inhaler Inhale 2 puffs into the lungs every 6 (six) hours as needed for wheezing or shortness of breath. For shortness of breath  . anastrozole (ARIMIDEX) 1 MG tablet Take 1 tablet (1 mg total) by mouth daily.  . cabergoline (DOSTINEX) 0.5 MG tablet Take 0.25 mg by mouth 2 (two) times a week.  . Cholecalciferol (VITAMIN D3) 1000 UNITS CAPS Take 1 capsule by mouth daily.  . cyanocobalamin 500 MCG tablet Take 500 mcg by mouth daily.  . dorzolamide (TRUSOPT) 2 % ophthalmic solution Place 1 drop into both eyes 3 (three) times daily.   . Fluticasone-Salmeterol (ADVAIR DISKUS) 100-50 MCG/DOSE AEPB Inhale 1 puff into the lungs 2 (two) times daily.   . furosemide (LASIX) 20 MG tablet Take 20 mg by mouth every morning.   Marland Kitchen GLUCOSAMINE HCL PO Take 1 tablet by mouth daily.  Marland Kitchen latanoprost (XALATAN) 0.005 % ophthalmic solution Place 1 drop into both eyes at bedtime.   . Multiple Vitamin (MULITIVITAMIN WITH MINERALS) TABS Take 1 tablet by mouth daily with breakfast.   . potassium chloride SA (K-DUR,KLOR-CON) 20 MEQ tablet Take 1 tablet (20 mEq total) by mouth 2 (two) times daily.  . simvastatin (ZOCOR) 20 MG tablet Take 20 mg by mouth daily with breakfast.   . valsartan-hydrochlorothiazide (DIOVAN-HCT) 160-25 MG per tablet Take 1 tablet by mouth daily with breakfast.    Current Facility-Administered Medications on File Prior to Visit  Medication  . lidocaine (cardiac) 100 mg/46m (XYLOCAINE) 20 MG/ML injection 2%     Chief Complaint  Patient presents with  . Follow-up    Wears CPAP nightly. Denies issues with mask. Pt feels pressure needs to be checked. C/o dry mouth. DME: [facility]     Tests PSG 06/09/97 >> AHI 37 Echo 02/20/12 >> EF 65 to 770% grade 2 diastolic dysfx  Past medical hx HTN, HLD, Asthma, Breast cancer 2013, Lymphedema Rt arm, Peripheral neuropathy, Glaucoma  Past  surgical hx, Allergies, Family hx, Social hx all reviewed.  Vital Signs BP 128/76 mmHg  Pulse 60  Ht '5\' 2"'$  (1.575 m)  Wt 275 lb 6.4 oz (124.921 kg)  BMI 50.36 kg/m2  SpO2 96%  History of Present Illness Kathryn ZAUCHAis a 65y.o. female with obstructive sleep apnea.  Her machine is very old.  She reports using every night, but unable to download any data from machine.  She uses nasal mask >> no issues with mask fit.  She gets about 7 hrs sleep per night.  Physical Exam  General - No distress ENT - No sinus tenderness, no oral exudate, no LAN, MP 4, enlarged tongue Cardiac - s1s2 regular, no murmur Chest - No wheeze/rales/dullness Back - No focal tenderness Abd - Soft, non-tender Ext - lymphedema Rt arm Neuro - Normal strength Skin - No rashes Psych - normal mood, and behavior   Assessment/Plan  Obstructive sleep apnea. - will arrange for new auto CPAP >> her current machine is old, and not working - will call her with results of CPAP download  Obesity. - discussed options to assist with weight loss   Patient Instructions  Will arrange for new CPAP machine Follow up in 1 year     VChesley Mires MD LCantonPager:  [phone]/14/2017, 4:12 PM

## 2015-11-07 NOTE — Patient Instructions (Signed)
Will arrange for new CPAP machine Follow up in 1 year

## 2015-11-28 ENCOUNTER — Other Ambulatory Visit: Payer: Self-pay | Admitting: Hematology

## 2015-11-28 ENCOUNTER — Telehealth: Payer: Self-pay | Admitting: *Deleted

## 2015-11-28 DIAGNOSIS — C50311 Malignant neoplasm of lower-inner quadrant of right female breast: Secondary | ICD-10-CM

## 2015-11-28 MED ORDER — ANASTROZOLE 1 MG PO TABS: 1.0000 mg | ORAL_TABLET | Freq: Every day | ORAL | Status: DC

## 2015-11-28 NOTE — Telephone Encounter (Signed)
Thu,  I have called in, please let pt know, thanks  Truitt Merle

## 2015-11-28 NOTE — Telephone Encounter (Signed)
Spoke with pt and informed pt that Dr. Burr Medico had e-scribed 10 tabs of Anastrozole to pt's pharmacy.  Pt voiced understanding.

## 2015-11-28 NOTE — Telephone Encounter (Signed)
TC from patient stating that she gets her Anastrozole through [employer] mail order but will not get a shipment until 12/10/15. She has only 2 pills left. She is requesting a small refill of 10 tablets to be called in to her local pharmacy of [facility] in [place]. Please notify patient when prescription has been called in.

## 2016-01-03 ENCOUNTER — Telehealth: Payer: Self-pay | Admitting: Pulmonary Disease

## 2016-01-03 NOTE — Telephone Encounter (Signed)
Auto CPAP 11/26/15 to 12/25/15 >> used on 30 of 30 nights with average 7 hrs 8 min.  Average AHI 1 with median CPAP 9 and 95 th percentile 13 cm H2O   Will have my nurse inform pt that CPAP report shows good control of sleep apnea.

## 2016-01-04 NOTE — Telephone Encounter (Signed)
Results have been explained to patient, pt expressed understanding. Nothing further needed.  

## 2016-02-15 ENCOUNTER — Other Ambulatory Visit (HOSPITAL_BASED_OUTPATIENT_CLINIC_OR_DEPARTMENT_OTHER): Payer: Medicare Other

## 2016-02-15 ENCOUNTER — Encounter: Payer: Self-pay | Admitting: Hematology

## 2016-02-15 ENCOUNTER — Telehealth: Payer: Self-pay | Admitting: Hematology

## 2016-02-15 ENCOUNTER — Ambulatory Visit (HOSPITAL_BASED_OUTPATIENT_CLINIC_OR_DEPARTMENT_OTHER): Payer: Medicare Other | Admitting: Hematology

## 2016-02-15 VITALS — BP 151/66 | HR 66 | Temp 97.6°F | Resp 18 | Ht 62.0 in | Wt 275.8 lb

## 2016-02-15 DIAGNOSIS — C50311 Malignant neoplasm of lower-inner quadrant of right female breast: Secondary | ICD-10-CM

## 2016-02-15 DIAGNOSIS — C50911 Malignant neoplasm of unspecified site of right female breast: Secondary | ICD-10-CM

## 2016-02-15 DIAGNOSIS — M8580 Other specified disorders of bone density and structure, unspecified site: Secondary | ICD-10-CM

## 2016-02-15 DIAGNOSIS — Z170 Estrogen receptor positive status [ER+]: Secondary | ICD-10-CM | POA: Diagnosis not present

## 2016-02-15 DIAGNOSIS — I10 Essential (primary) hypertension: Secondary | ICD-10-CM | POA: Diagnosis not present

## 2016-02-15 DIAGNOSIS — E669 Obesity, unspecified: Secondary | ICD-10-CM

## 2016-02-15 DIAGNOSIS — Z79811 Long term (current) use of aromatase inhibitors: Secondary | ICD-10-CM

## 2016-02-15 LAB — COMPREHENSIVE METABOLIC PANEL
ALBUMIN: 3.8 g/dL (ref 3.5–5.0)
ALK PHOS: 91 U/L (ref 40–150)
ALT: 11 U/L (ref 0–55)
ANION GAP: 12 meq/L — AB (ref 3–11)
AST: 14 U/L (ref 5–34)
BILIRUBIN TOTAL: 1.29 mg/dL — AB (ref 0.20–1.20)
BUN: 20.7 mg/dL (ref 7.0–26.0)
CO2: 27 meq/L (ref 22–29)
CREATININE: 1 mg/dL (ref 0.6–1.1)
Calcium: 9.7 mg/dL (ref 8.4–10.4)
Chloride: 101 mEq/L (ref 98–109)
EGFR: 65 mL/min/{1.73_m2} — ABNORMAL LOW (ref >90)
GLUCOSE: 94 mg/dL (ref 70–140)
Potassium: 3.8 mEq/L (ref 3.5–5.1)
Sodium: 140 mEq/L (ref 136–145)
TOTAL PROTEIN: 7.5 g/dL (ref 6.4–8.3)

## 2016-02-15 LAB — CBC WITH DIFFERENTIAL/PLATELET
BASO%: 0.7 % (ref 0.0–2.0)
Basophils Absolute: 0 10*3/uL (ref 0.0–0.1)
EOS ABS: 0.1 10*3/uL (ref 0.0–0.5)
EOS%: 2.1 % (ref 0.0–7.0)
HCT: 35.6 % (ref 34.8–46.6)
HEMOGLOBIN: 11.7 g/dL (ref 11.6–15.9)
LYMPH#: 2.2 10*3/uL (ref 0.9–3.3)
LYMPH%: 36.2 % (ref 14.0–49.7)
MCH: 29.2 pg (ref 25.1–34.0)
MCHC: 32.9 g/dL (ref 31.5–36.0)
MCV: 88.9 fL (ref 79.5–101.0)
MONO#: 0.3 10*3/uL (ref 0.1–0.9)
MONO%: 5.5 % (ref 0.0–14.0)
NEUT%: 55.5 % (ref 38.4–76.8)
NEUTROS ABS: 3.4 10*3/uL (ref 1.5–6.5)
PLATELETS: 189 10*3/uL (ref 145–400)
RBC: 4 10*6/uL (ref 3.70–5.45)
RDW: 15.1 % — AB (ref 11.2–14.5)
WBC: 6.1 10*3/uL (ref 3.9–10.3)

## 2016-02-15 MED ORDER — ANASTROZOLE 1 MG PO TABS: 1.0000 mg | ORAL_TABLET | Freq: Every day | ORAL | 1 refills | Status: DC

## 2016-02-15 NOTE — Telephone Encounter (Signed)
GAVE PATIENT AVS REPORT AND APPOINTMENTS FOR MARCH  °

## 2016-02-15 NOTE — Progress Notes (Signed)
[place]  [phone] [phone]  OFFICE PROGRESS NOTE   PCP:  Stephens Shire, M.D. SU: Neldon Mc, M.D. RAD ONC: Arloa Koh, M.D.   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   PRIOR THERAPY: #1 The patient was originally seen at the multidisciplinary breast clinic on 08/05/2011 for a stage IIB, invasive ductal carcinoma (T3 NX MX).   #2  Status post right simple mastectomy with right axillary lymph node resection on 08/14/2011 with the final pathology revealing invasive and in situ ductal carcinoma. Two nodules were seen 3 cm and 2.2 cm.  Margins were negative.  There was focal lymphovascular space involvement by tumor. 1/15 sentinel lymph nodes were positive for metastatic disease.  The tumor was estrogen receptor positive 100%,  progesterone receptor positive 100%,  and HER-2/neu negative-no amplification by CISH with a ratio of 1.09, proliferation marker 53%. Final pathologic staging was stage IIB, pT2 pN1a, pMX.    #3.  S/P Adjuvant chemotherapy with Taxotere/Cytoxan  Q3 weeks beginning on 09/22/2011.  The patient is a participant in NSBP B-49 protocol.  She completed total of 6 cycles of Taxotere/Cytoxan given from 09/22/2011 -  01/05/2012.  #4   Status post radiation therapy to the right chest wall and axilla completed on 03/31/2012.  #5  She began adjuvant antiestrogen therapy with Arimidex 1 mg by mouth daily in 03/2012.  A total of 5 years of therapy is planned.  #6   bone density scan:  in 03/2012 showed a T score of 1.1 (normal).   CURRENT THERAPY: Arimidex 1 mg daily starting in 03/2012.   INTERVAL HISTORY:   [name] 65 y.o. female returns for follow up. She is compliant with Arimidex, and tolerates well. She has mild hot flash, no other noticeable side effects. She does go to [facility]  exercise 5 days a week. She has mild knee pain, tolerable. No other new complaints.  MEDICAL HISTORY: Past Medical History:  Diagnosis Date  . Arthritis   . Asthma   . Blood transfusion    hx of last one in 1987  . Breast cancer ([place])   . Cancer of lower-inner quadrant of female breast ([place]) 07/24/2011   right breast cancer /IDC,stage IIB,er/pr=+,her2=Neg  . Glaucoma   . Heart murmur   . History of chemotherapy    taxotere/cytoxan 6 cycles 09/22/11-01/05/12 day 2 neulasta  . Hyperlipemia   . Hypertension   . Lymphedema    right arm wears a sleeve  . MRSA (methicillin resistant staph aureus) culture positive    08/2011  . Neuromuscular disorder ([place])    peripheral neuropathy hands feet  . OSA (obstructive sleep apnea)    CPAP settings- ?   . Pituitary abnormality ([place]) 05-25-13   small pituitary growth-appears stable- Dr. Christella Noa follows    ALLERGIES:  is allergic to trandolapril-verapamil hcl er.   SURGICAL HISTORY:  Past Surgical History:  Procedure Laterality Date  . ABDOMINAL HYSTERECTOMY  2007   TAH/BSO  . BREAST SURGERY     mastectomy RIGHT  . CARPAL TUNNEL RELEASE Right    2012  . CHOLECYSTECTOMY  1987   open - Dr Lindon Romp  . COLONOSCOPY N/A 06/13/2013   Procedure: COLONOSCOPY;  Surgeon: Juanita Craver, MD;  [facility];  Service: Endoscopy;  Laterality: N/A;  . DILATION AND CURETTAGE OF UTERUS     x3  .  EYE SURGERY     laser eye surgery for glaucoma  . MASTECTOMY MODIFIED RADICAL  08/14/11   right , ER/PR +, HER2 -  . port-a-cath insertion     port-a-cath removal 1'14  . PORT-A-CATH REMOVAL  06/22/2012   Procedure: MINOR REMOVAL PORT-A-CATH;  Surgeon: Haywood Lasso, MD;  Location: [place];  Service: General;  Laterality: Left;  . PORTACATH PLACEMENT  09/11/2011   Procedure: INSERTION PORT-A-CATH;  Surgeon: Joyice Faster. Cornett, MD;  [facility];  Service: General;  Laterality: N/A;  Insert of Port  . TUBAL LIGATION      REVIEW  OF SYSTEMS:   A 10 point review of systems was conducted and is otherwise negative except for what is noted above.    Health Maintenance  Mammogram: 07/31/2015 Colonoscopy: 05/2013 with Dr. Collene Mares, f/u in 5 years was recommended Bone Density Scan: 08/03/2014 Pap Smear: due in 01/2014, done by Dr. Leonie Douglas Exam: 01/18/2014 Vitamin D Level: followed by Dr. Rica Mote Lipid Panel: 05/2013, on Zocor, followed by PCP   PHYSICAL EXAMINATION:  BP (!) 151/66 (BP Location: Left Arm, Patient Position: Sitting)   Pulse 66   Temp 97.6 F (36.4 C) (Oral)   Resp 18   Ht 5' 2"  (1.575 m)   Wt 275 lb 12.8 oz (125.1 kg)   SpO2 100%   BMI 50.44 kg/m  GENERAL: Patient is a well appearing female in no acute distress HEENT:  Sclerae anicteric.  Oropharynx clear and moist. No ulcerations or evidence of oropharyngeal candidiasis. Neck is supple.  NODES:  No cervical, supraclavicular, or axillary lymphadenopathy palpated.  BREAST EXAM:  S/p right mastectomy, no nodularity, no tenderness at the right rib cage. left breast without masses or nodules, axillary no palpable nodes   LUNGS:  Clear to auscultation bilaterally.  No wheezes or rhonchi. HEART:  Regular rate and rhythm. No murmur appreciated. ABDOMEN:  Soft, nontender.  Positive, normoactive bowel sounds. No organomegaly palpated. MSK:  No focal spinal tenderness to palpation. Full range of motion bilaterally in the upper extremities. EXTREMITIES:  No peripheral edema.  Right arm with lymphedema sleeve SKIN:  Clear with no obvious rashes or skin changes. No nail dyscrasia. NEURO:  Nonfocal. Well oriented.  Appropriate affect.  ECOG PERFORMANCE STATUS: 1 - Symptomatic but completely ambulatory  LABORATORY DATA: CBC Latest Ref Rng & Units 02/15/2016 08/14/2015 01/18/2015  WBC 3.9 - 10.3 10e3/uL 6.1 5.0 5.4  Hemoglobin 11.6 - 15.9 g/dL 11.7 12.0 12.6  Hematocrit 34.8 - 46.6 % 35.6 36.5 37.9  Platelets 145 - 400 10e3/uL 189 153 180   CMP Latest Ref Rng &  Units 02/15/2016 08/14/2015 01/18/2015  Glucose 70 - 140 mg/dl 94 107 99  BUN 7.0 - 26.0 mg/dL 20.7 15.2 17.6  Creatinine 0.6 - 1.1 mg/dL 1.0 0.9 0.9  Sodium 136 - 145 mEq/L 140 144 144  Potassium 3.5 - 5.1 mEq/L 3.8 3.4(L) 3.8  Chloride 98 - 107 mEq/L - - -  CO2 22 - 29 mEq/L 27 30(H) 29  Calcium 8.4 - 10.4 mg/dL 9.7 9.6 10.2  Total Protein 6.4 - 8.3 g/dL 7.5 7.4 7.7  Total Bilirubin 0.20 - 1.20 mg/dL 1.29(H) 1.31(H) 1.40(H)  Alkaline Phos 40 - 150 U/L 91 75 90  AST 5 - 34 U/L 14 14 14   ALT 0 - 55 U/L 11 14 14        Ref Range & Units 70moago 333yrgo   Vitamin D, 25-Hydroxy 30.0 - 100.0 ng/mL  50.2  45R, CM         RADIOLOGY: Mammogram 07/31/2015  FINDINGS: CC and MLO views the left breast with tomosynthesis were obtained. The circumscribed oval approximate 6 mm mass in the lower left breast, likely with a fatty hilus on the CC tomosynthesis images, is unchanged dating back to March, 2016. No new or suspicious findings elsewhere in the left breast.  Mammographic images were processed with CAD.  IMPRESSION: Stable likely benign 6 mm intramammary lymph node in the lower left breast (without sonographic correlate) dating back to March, 2016.  RECOMMENDATION: Diagnostic left mammogram in 1 year in order to confirm 2 years of stability.  Bone density scan 07/30/2014 FINDINGS: LEFT FOREARM (1/3 RADIUS)  Bone Mineral Density (BMD): 0.623  Young Adult T Score: -1.2  Z Score: 0.5  ASSESSMENT: Patient's diagnostic category is LOW BONE MASS by WHO Criteria.   ASSESSMENT: [name] is a 65 year-old female:  1. Right breast IDA, stage IIB, pT2 pN1a, estrogen receptor positive 100%,  progesterone receptor positive 100%,  and HER-2/neu negative - She is clinically doing very well without evidence of cancer recurrence. -Her recent screening mammogram showed a possible left breast mass. She is going to have additional image next week (was rescheduled). She will call me  if biopsy is done. -She will continue Arimidex, she is tolerating well. We discussed the duration of adjuvant Arimidex, new clinical check chart data supports last recurrence with extended 10 years of AI which showed lower cancer recurrence.  If she continues to tolerate it well, we'll plan for 10 years Arimidex, given her stage IIB disease. -I encouraged her to continue healthy diet, exercise regularly, and take calcium and vitamin D for bone health, and try to lose some weight. -She is also being follow-up on the study of NSABP B49.  2. Osteopenia -Her bone density scan from March 2016 showed osteopenia, she has normal bone density in 2013 -We discussed that Arimidex may wake her bone.  -I encouraged her to take calcium 1g and vitamin D 1000 units daily -Her vitamin D level was checked in March 2017 and was normal.  3.  Mild bilirubinemia  -She has intermittent mild elevation of bilirubin, overall stable, possibly related to medications. -We'll continue monitoring  4. HTN and obesity  -She'll continue follow-up with her primary care physician -I encouraged her to continue healthy diet, exercise and try to lose some weight  Plan -Continue anastrozole -Return to clinic in 6 months with lab.    All the information above was reviewed in detail with [name] and she is in agreement with the above plan.  All questions were answered.  [name] encouraged to contact us in the interim if she has any problems, questions or concerns.   Truitt Merle  02/15/2016

## 2016-02-24 ENCOUNTER — Other Ambulatory Visit: Payer: Self-pay | Admitting: Hematology

## 2016-02-24 DIAGNOSIS — C50311 Malignant neoplasm of lower-inner quadrant of right female breast: Secondary | ICD-10-CM

## 2016-03-17 ENCOUNTER — Ambulatory Visit (INDEPENDENT_AMBULATORY_CARE_PROVIDER_SITE_OTHER): Payer: Medicare Other | Admitting: Pulmonary Disease

## 2016-03-17 ENCOUNTER — Encounter: Payer: Self-pay | Admitting: Pulmonary Disease

## 2016-03-17 VITALS — BP 138/74 | HR 69 | Ht 62.0 in | Wt 280.0 lb

## 2016-03-17 DIAGNOSIS — Z6841 Body Mass Index (BMI) 40.0 and over, adult: Secondary | ICD-10-CM

## 2016-03-17 DIAGNOSIS — Z23 Encounter for immunization: Secondary | ICD-10-CM

## 2016-03-17 DIAGNOSIS — G4733 Obstructive sleep apnea (adult) (pediatric): Secondary | ICD-10-CM

## 2016-03-17 DIAGNOSIS — Z9989 Dependence on other enabling machines and devices: Secondary | ICD-10-CM | POA: Diagnosis not present

## 2016-03-17 NOTE — Progress Notes (Signed)
Current Outpatient Prescriptions on File Prior to Visit  Medication Sig  . albuterol (PROAIR HFA) 108 (90 BASE) MCG/ACT inhaler Inhale 2 puffs into the lungs every 6 (six) hours as needed for wheezing or shortness of breath. For shortness of breath  . anastrozole (ARIMIDEX) 1 MG tablet TAKE 1 TABLET BY MOUTH DAILY  . cabergoline (DOSTINEX) 0.5 MG tablet Take 0.25 mg by mouth 2 (two) times a week.  . Cholecalciferol (VITAMIN D3) 1000 UNITS CAPS Take 1 capsule by mouth daily.  . cyanocobalamin 500 MCG tablet Take 500 mcg by mouth daily.  . dorzolamide (TRUSOPT) 2 % ophthalmic solution Place 1 drop into both eyes 3 (three) times daily.   . Fluticasone-Salmeterol (ADVAIR DISKUS) 100-50 MCG/DOSE AEPB Inhale 1 puff into the lungs 2 (two) times daily.   . furosemide (LASIX) 20 MG tablet Take 20 mg by mouth every morning.   Marland Kitchen GLUCOSAMINE HCL PO Take 1 tablet by mouth daily.  Marland Kitchen latanoprost (XALATAN) 0.005 % ophthalmic solution Place 1 drop into both eyes at bedtime.   . Multiple Vitamin (MULITIVITAMIN WITH MINERALS) TABS Take 1 tablet by mouth daily with breakfast.   . potassium chloride SA (K-DUR,KLOR-CON) 20 MEQ tablet Take 1 tablet (20 mEq total) by mouth 2 (two) times daily.  . simvastatin (ZOCOR) 20 MG tablet Take 20 mg by mouth daily with breakfast.   . valsartan-hydrochlorothiazide (DIOVAN-HCT) 160-25 MG per tablet Take 1 tablet by mouth daily with breakfast.    Current Facility-Administered Medications on File Prior to Visit  Medication  . lidocaine (cardiac) 100 mg/54m (XYLOCAINE) 20 MG/ML injection 2%     Chief Complaint  Patient presents with  . Follow-up    Wears CPAP nightly. Denies any issues with mask/pressure. Needs new tubing, mask, filters and headgears. DME: [facility]; Pt wants flu vaccine today if able.     Sleep tests PSG 06/09/97 >> AHI 37 Auto CPAP 02/13/16 to 03/13/16 >> used on 30 of 30 nights with average 6 hrs 48 min.  Average AHI 0.8 with median CPAP 8 and 95 th percentile  CPAP 11 cm H2O  Cardiac tests Echo 02/20/12 >> EF 65 to 758% grade 2 diastolic dysfx  Past medical hx HTN, HLD, Asthma, Breast cancer 2013, Lymphedema Rt arm, Peripheral neuropathy, Glaucoma  Past surgical hx, Allergies, Family hx, Social hx all reviewed.  Vital Signs BP 138/74 (BP Location: Left Wrist, Cuff Size: Normal)   Pulse 69   Ht '5\' 2"'$  (1.575 m)   Wt 280 lb (127 kg)   SpO2 98%   BMI 51.21 kg/m   History of Present Illness Kathryn MARTEis a 65y.o. female with obstructive sleep apnea.  She got her new machine, and needed ROV to assess therapy with new machine.  She is doing well and likes new machine.  She is sleeping through the night and feels rested.  No issues with mask fit or mouth dryness.   Physical Exam  General - No distress ENT - No sinus tenderness, no oral exudate, no LAN, MP 4, enlarged tongue Cardiac - s1s2 regular, no murmur Chest - No wheeze/rales/dullness Back - No focal tenderness Abd - Soft, non-tender Ext - lymphedema Rt arm Neuro - Normal strength Skin - No rashes Psych - normal mood, and behavior   Assessment/Plan  Obstructive sleep apnea. - she is compliant with CPAP and reports benefit - continue auto CPAP - will arrange for replacement supplies  Obesity. - discussed options to assist with weight loss  Patient Instructions  Flu shot today  Will arrange for new CPAP supplies   Follow up 1 year from today    Chesley Mires, MD  Pulmonary/Critical Care/Sleep Pager:  [phone] 03/17/2016, 3:50 PM

## 2016-03-17 NOTE — Patient Instructions (Signed)
Flu shot today  Will arrange for new CPAP supplies   Follow up 1 year from today

## 2016-04-09 ENCOUNTER — Other Ambulatory Visit: Payer: Self-pay | Admitting: Neurosurgery

## 2016-04-09 DIAGNOSIS — D352 Benign neoplasm of pituitary gland: Secondary | ICD-10-CM

## 2016-04-28 ENCOUNTER — Ambulatory Visit
Admission: RE | Admit: 2016-04-28 | Discharge: 2016-04-28 | Disposition: A | Discharge status: Home / Self Care | Payer: Medicare Other | Source: Ambulatory Visit | Attending: Neurosurgery | Admitting: Neurosurgery

## 2016-04-28 DIAGNOSIS — D352 Benign neoplasm of pituitary gland: Secondary | ICD-10-CM

## 2016-04-28 MED ORDER — GADOBENATE DIMEGLUMINE 529 MG/ML IV SOLN
9.0000 mL | Freq: Once | INTRAVENOUS | Status: AC | PRN
  Administered 2016-04-28: 9 mL via INTRAVENOUS

## 2016-06-13 ENCOUNTER — Other Ambulatory Visit: Payer: Self-pay | Admitting: Hematology

## 2016-06-13 DIAGNOSIS — N63 Unspecified lump in unspecified breast: Secondary | ICD-10-CM

## 2016-08-01 ENCOUNTER — Ambulatory Visit
Admission: RE | Admit: 2016-08-01 | Discharge: 2016-08-01 | Disposition: A | Discharge status: Home / Self Care | Payer: Medicare Other | Source: Ambulatory Visit | Attending: Hematology | Admitting: Hematology

## 2016-08-01 DIAGNOSIS — N63 Unspecified lump in unspecified breast: Secondary | ICD-10-CM

## 2016-08-12 NOTE — Progress Notes (Signed)
[facility]  [phone] [phone]  OFFICE PROGRESS NOTE   PCP:  Stephens Shire, M.D. SU: Neldon Mc, M.D. RAD ONC: Arloa Koh, M.D.   DIAGNOSIS: [name] is a 66 y.o. 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  PRIOR THERAPY: #1 The patient was originally seen at the multidisciplinary breast clinic on 08/05/2011 for a stage IIB, invasive ductal carcinoma (T3 NX MX).   #2  Status post right simple mastectomy with right axillary lymph node resection on 08/14/2011 with the final pathology revealing invasive and in situ ductal carcinoma. Two nodules were seen 3 cm and 2.2 cm.  Margins were negative.  There was focal lymphovascular space involvement by tumor. 1/15 sentinel lymph nodes were positive for metastatic disease.  The tumor was estrogen receptor positive 100%,  progesterone receptor positive 100%,  and HER-2/neu negative-no amplification by CISH with a ratio of 1.09, proliferation marker 53%. Final pathologic staging was stage IIB, pT2 pN1a, pMX.    #3.  S/P Adjuvant chemotherapy with Taxotere/Cytoxan  Q3 weeks beginning on 09/22/2011.  The patient is a participant in NSBP B-49 protocol.  She completed total of 6 cycles of Taxotere/Cytoxan given from 09/22/2011 -  01/05/2012.  #4   Status post radiation therapy to the right chest wall and axilla completed on 03/31/2012.  #5  She began adjuvant antiestrogen therapy with Arimidex 1 mg by mouth daily in 03/2012.  A total of 10 years of therapy is planned.  #6  Bone density scan:  in 03/2012 showed a T score of 1.1 (normal).  #7  Bone density scan: in 08/03/2014 showed a T score of -1.2 (low bone mass)  #8  Left breast mammogram: on 08/01/2016 showed no evidence of malignancy.   CURRENT THERAPY: Arimidex 1 mg daily started in 03/2012.   INTERVAL HISTORY:   [name] 66 y.o. female  returns for follow up. Mammogram on 08/01/16 was negative. She reports managable hot flashes with Arimidex. She reports tightness behind her right lower extremities. She reports bilateral lower extremity swelling that is new, but more of the right leg. She is wearing a right lymphedema sleeve. She denies SOB.  MEDICAL HISTORY: Past Medical History:  Diagnosis Date  . Arthritis   . Asthma   . Blood transfusion    hx of last one in 1987  . Breast cancer ([place])   . Cancer of lower-inner quadrant of female breast ([place]) 07/24/2011   right breast cancer /IDC,stage IIB,er/pr=+,her2=Neg  . Glaucoma   . Heart murmur   . History of chemotherapy    taxotere/cytoxan 6 cycles 09/22/11-01/05/12 day 2 neulasta  . Hyperlipemia   . Hypertension   . Lymphedema    right arm wears a sleeve  . MRSA (methicillin resistant staph aureus) culture positive    08/2011  . Neuromuscular disorder ([place])    peripheral neuropathy hands feet  . OSA (obstructive sleep apnea)    CPAP settings- ?   . Pituitary abnormality ([place]) 05-25-13   small pituitary growth-appears stable- Dr. Christella Noa follows    ALLERGIES:  is allergic to trandolapril-verapamil hcl er.   SURGICAL HISTORY:  Past Surgical History:  Procedure Laterality Date  . ABDOMINAL HYSTERECTOMY  2007   TAH/BSO  . BREAST SURGERY     mastectomy RIGHT  . CARPAL TUNNEL RELEASE Right    2012  . CHOLECYSTECTOMY  1987   open - Dr Lindon Romp  .  COLONOSCOPY N/A 06/13/2013   Procedure: COLONOSCOPY;  Surgeon: Juanita Craver, MD;  Location: [facility];  Service: Endoscopy;  Laterality: N/A;  . DILATION AND CURETTAGE OF UTERUS     x3  . EYE SURGERY     laser eye surgery for glaucoma  . MASTECTOMY MODIFIED RADICAL  08/14/11   right , ER/PR +, HER2 -  . port-a-cath insertion     port-a-cath removal 1'14  . PORT-A-CATH REMOVAL  06/22/2012   Procedure: MINOR REMOVAL PORT-A-CATH;  Surgeon: Haywood Lasso, MD;  Location: [facility];  Service: General;   Laterality: Left;  . PORTACATH PLACEMENT  09/11/2011   Procedure: INSERTION PORT-A-CATH;  Surgeon: Joyice Faster. Cornett, MD;  Location: [facility];  Service: General;  Laterality: N/A;  Insert of Port  . TUBAL LIGATION      REVIEW OF SYSTEMS:   A 10 point review of systems was conducted and is otherwise negative except for what is noted above.   Health Maintenance  Mammogram: 07/31/2015 Colonoscopy: 05/2013 with Dr. Collene Mares, f/u in 5 years was recommended Bone Density Scan: 08/03/2014 Pap Smear: due in 01/2014, done by Dr. Leonie Douglas Exam: 01/18/2014 Vitamin D Level: followed by Dr. Rica Mote Lipid Panel: 05/2013, on Zocor, followed by PCP   PHYSICAL EXAMINATION:  BP (!) 164/68 (BP Location: Left Arm, Patient Position: Sitting)   Pulse 69   Temp 97.8 F (36.6 C) (Oral)   Resp 18   Ht 5' 2"  (1.575 m)   Wt 288 lb 11.2 oz (131 kg)   SpO2 99%   BMI 52.80 kg/m  GENERAL: Patient is a well appearing female in no acute distress HEENT:  Sclerae anicteric.  Oropharynx clear and moist. No ulcerations or evidence of oropharyngeal candidiasis. Neck is supple.  NODES:  No cervical, supraclavicular, or axillary lymphadenopathy palpated.   LUNGS:  Clear to auscultation bilaterally.  No wheezes or rhonchi. HEART:  Regular rate and rhythm. No murmur appreciated. ABDOMEN:  Soft, nontender.  Positive, normoactive bowel sounds. No organomegaly palpated. MSK:  No focal spinal tenderness to palpation. Full range of motion bilaterally in the upper extremities. EXTREMITIES:  (+) Right arm with lymphedema sleeve. (+) Lower extremity edema below the knees, more so of the right leg. SKIN:  Clear with no obvious rashes or skin changes. No nail dyscrasia. NEURO:  Nonfocal. Well oriented.  Appropriate affect. BREAST EXAM:  S/p right mastectomy, no nodularity, no tenderness at the right rib cage. left breast without masses or nodules, axillary no palpable nodes   ECOG PERFORMANCE STATUS: 1 - Symptomatic but completely  ambulatory  LABORATORY DATA: CBC Latest Ref Rng & Units 08/15/2016 02/15/2016 08/14/2015  WBC 3.9 - 10.3 10e3/uL 4.8 6.1 5.0  Hemoglobin 11.6 - 15.9 g/dL 12.6 11.7 12.0  Hematocrit 34.8 - 46.6 % 38.8 35.6 36.5  Platelets 145 - 400 10e3/uL 116(L) 189 153   CMP Latest Ref Rng & Units 08/15/2016 02/15/2016 08/14/2015  Glucose 70 - 140 mg/dl 81 94 107  BUN 7.0 - 26.0 mg/dL 18.0 20.7 15.2  Creatinine 0.6 - 1.1 mg/dL 0.9 1.0 0.9  Sodium 136 - 145 mEq/L 141 140 144  Potassium 3.5 - 5.1 mEq/L 4.1 3.8 3.4(L)  Chloride 98 - 107 mEq/L - - -  CO2 22 - 29 mEq/L 25 27 30(H)  Calcium 8.4 - 10.4 mg/dL 9.7 9.7 9.6  Total Protein 6.4 - 8.3 g/dL 7.1 7.5 7.4  Total Bilirubin 0.20 - 1.20 mg/dL 1.35(H) 1.29(H) 1.31(H)  Alkaline Phos 40 - 150  U/L 84 91 75  AST 5 - 34 U/L 11 14 14   ALT 0 - 55 U/L 12 11 14    Results for ALAENA, STRADER (MRN [id number]) as of 08/12/2016 09:12  Ref. Range 06/21/2012 09:36 08/14/2015 09:00  Vitamin D, 25-Hydroxy Latest Ref Range: 30.0 - 100.0 ng/mL 45 50.2    RADIOLOGY:  MAMMOGRAM LEFT BREAST 08/01/2016 IMPRESSION: No evidence of malignancy. Stable benign mass within the lower left breast, presumed intramammary lymph node.  Mammogram 07/31/2015  FINDINGS: CC and MLO views the left breast with tomosynthesis were obtained. The circumscribed oval approximate 6 mm mass in the lower left breast, likely with a fatty hilus on the CC tomosynthesis images, is unchanged dating back to March, 2016. No new or suspicious findings elsewhere in the left breast.  Mammographic images were processed with CAD.  IMPRESSION: Stable likely benign 6 mm intramammary lymph node in the lower left breast (without sonographic correlate) dating back to March, 2016.  RECOMMENDATION: Diagnostic left mammogram in 1 year in order to confirm 2 years of stability.  Bone density scan 07/30/2014 FINDINGS: LEFT FOREARM (1/3 RADIUS) Bone Mineral Density (BMD): 0.623 Young Adult T Score: -1.2 Z  Score: 0.5 ASSESSMENT: Patient's diagnostic category is LOW BONE MASS by WHO Criteria.  ASSESSMENT: [name] is a 66 y.o. female:  1. Right breast IDA, stage IIB, pT2 pN1aM0, estrogen receptor positive 100%,  progesterone receptor positive 100%,  and HER-2/neu negative - She is clinically doing very well without evidence of cancer recurrence. -Her recent screening mammogram showed a possible left breast mass. She is going to have additional image next week (was rescheduled). She will call me if biopsy is done. -She will continue Arimidex, she is tolerating well. We discussed the duration of adjuvant Arimidex, new clinical data supports less recurrence with extended 10 years of AI than 5 years, but no significant difference between 7 and 10 years of AI.  If she continues to tolerate it well, we'll plan for 7 years Arimidex (until 2020), given her stage IIB disease. -I encouraged her to continue healthy diet, exercise regularly, and take calcium and vitamin D for bone health, and try to lose some weight. -She is also being follow-up on the study of NSABP B49.  2. Osteopenia -Her bone density scan from March 2016 showed osteopenia, she has normal bone density in 2013 -We discussed that Arimidex may weaken her bone.  -I encouraged her to take calcium 1g and vitamin D 1000 units daily -Her vitamin D level was checked in March 2017 and was normal. -Bone density scan due. Will have one performed in 1 month.  3.  Mild bilirubinemia  -She has intermittent mild elevation of bilirubin, overall stable, possibly related to medications. -We'll continue monitoring  4. HTN and obesity  -She'll continue follow-up with her primary care physician -I encouraged her to continue healthy diet, exercise and try to lose some weight  5. Lower extremity edema -Concerning for DVT. -Will order a Doppler today, which was negative   Plan -Continue anastrozole. I refilled today. -Return to clinic in 6 months  with lab.  -Bone scan in 1 month -Lower extremity Doppler today to rule out DVT.  All the information above was reviewed in detail with [name] and she is in agreement with the above plan.  All questions were answered.  [name] encouraged to contact us in the interim if she has any problems, questions or concerns.   Truitt Merle  08/15/2016   This document  serves as a record of services personally performed by Truitt Merle, MD. It was created on her behalf by Darcus Austin, a trained medical scribe. The creation of this record is based on the scribe's personal observations and the provider's statements to them. This document has been checked and approved by the attending provider.

## 2016-08-15 ENCOUNTER — Telehealth: Payer: Self-pay | Admitting: Hematology

## 2016-08-15 ENCOUNTER — Other Ambulatory Visit: Payer: Self-pay | Admitting: Hematology

## 2016-08-15 ENCOUNTER — Ambulatory Visit (HOSPITAL_BASED_OUTPATIENT_CLINIC_OR_DEPARTMENT_OTHER): Payer: Medicare Other | Admitting: Hematology

## 2016-08-15 ENCOUNTER — Other Ambulatory Visit (HOSPITAL_BASED_OUTPATIENT_CLINIC_OR_DEPARTMENT_OTHER): Payer: Medicare Other

## 2016-08-15 ENCOUNTER — Ambulatory Visit (HOSPITAL_COMMUNITY)
Admission: RE | Admit: 2016-08-15 | Discharge: 2016-08-15 | Disposition: A | Discharge status: Home / Self Care | Payer: Medicare Other | Source: Ambulatory Visit | Attending: Hematology | Admitting: Hematology

## 2016-08-15 ENCOUNTER — Encounter: Payer: Self-pay | Admitting: Hematology

## 2016-08-15 VITALS — BP 164/68 | HR 69 | Temp 97.8°F | Resp 18 | Ht 62.0 in | Wt 288.7 lb

## 2016-08-15 DIAGNOSIS — I10 Essential (primary) hypertension: Secondary | ICD-10-CM

## 2016-08-15 DIAGNOSIS — C50911 Malignant neoplasm of unspecified site of right female breast: Secondary | ICD-10-CM

## 2016-08-15 DIAGNOSIS — Z79811 Long term (current) use of aromatase inhibitors: Secondary | ICD-10-CM | POA: Diagnosis not present

## 2016-08-15 DIAGNOSIS — M79661 Pain in right lower leg: Secondary | ICD-10-CM

## 2016-08-15 DIAGNOSIS — R600 Localized edema: Secondary | ICD-10-CM

## 2016-08-15 DIAGNOSIS — C50311 Malignant neoplasm of lower-inner quadrant of right female breast: Secondary | ICD-10-CM

## 2016-08-15 DIAGNOSIS — M8580 Other specified disorders of bone density and structure, unspecified site: Secondary | ICD-10-CM

## 2016-08-15 DIAGNOSIS — Z170 Estrogen receptor positive status [ER+]: Secondary | ICD-10-CM | POA: Diagnosis not present

## 2016-08-15 DIAGNOSIS — M7989 Other specified soft tissue disorders: Secondary | ICD-10-CM

## 2016-08-15 LAB — COMPREHENSIVE METABOLIC PANEL
ALBUMIN: 3.8 g/dL (ref 3.5–5.0)
ALK PHOS: 84 U/L (ref 40–150)
ALT: 12 U/L (ref 0–55)
AST: 11 U/L (ref 5–34)
Anion Gap: 11 mEq/L (ref 3–11)
BILIRUBIN TOTAL: 1.35 mg/dL — AB (ref 0.20–1.20)
BUN: 18 mg/dL (ref 7.0–26.0)
CALCIUM: 9.7 mg/dL (ref 8.4–10.4)
CO2: 25 mEq/L (ref 22–29)
Chloride: 104 mEq/L (ref 98–109)
Creatinine: 0.9 mg/dL (ref 0.6–1.1)
EGFR: 74 mL/min/{1.73_m2} — AB (ref >90)
GLUCOSE: 81 mg/dL (ref 70–140)
Potassium: 4.1 mEq/L (ref 3.5–5.1)
SODIUM: 141 meq/L (ref 136–145)
TOTAL PROTEIN: 7.1 g/dL (ref 6.4–8.3)

## 2016-08-15 LAB — CBC WITH DIFFERENTIAL/PLATELET
BASO%: 0.2 % (ref 0.0–2.0)
BASOS ABS: 0 10*3/uL (ref 0.0–0.1)
EOS%: 3.3 % (ref 0.0–7.0)
Eosinophils Absolute: 0.2 10*3/uL (ref 0.0–0.5)
HEMATOCRIT: 38.8 % (ref 34.8–46.6)
HEMOGLOBIN: 12.6 g/dL (ref 11.6–15.9)
LYMPH%: 45.9 % (ref 14.0–49.7)
MCH: 29 pg (ref 25.1–34.0)
MCHC: 32.5 g/dL (ref 31.5–36.0)
MCV: 89.4 fL (ref 79.5–101.0)
MONO#: 0.3 10*3/uL (ref 0.1–0.9)
MONO%: 5.2 % (ref 0.0–14.0)
NEUT#: 2.2 10*3/uL (ref 1.5–6.5)
NEUT%: 45.4 % (ref 38.4–76.8)
NRBC: 0 % (ref 0–0)
Platelets: 116 10*3/uL — ABNORMAL LOW (ref 145–400)
RBC: 4.34 10*6/uL (ref 3.70–5.45)
RDW: 14.2 % (ref 11.2–14.5)
WBC: 4.8 10*3/uL (ref 3.9–10.3)
lymph#: 2.2 10*3/uL (ref 0.9–3.3)

## 2016-08-15 MED ORDER — ANASTROZOLE 1 MG PO TABS: 1.0000 mg | ORAL_TABLET | Freq: Every day | ORAL | 1 refills | Status: DC

## 2016-08-15 NOTE — Progress Notes (Signed)
*  PRELIMINARY RESULTS* Vascular Ultrasound Right lower extremity venous duplex has been completed.  Preliminary findings: Very poor visualization of veins due to body habitus, therefore not all veins clearly evaluated. No obvious DVT is noted.    Attempted call report to Dr. Burr Medico. Left voice message.  Landry Mellow, RDMS, RVT  08/15/2016, 2:18 PM

## 2016-08-15 NOTE — Telephone Encounter (Signed)
Appointments scheduled per 3.23.18 LOS. Patient given AVS report and calendars with future scheduled appointments.

## 2016-11-10 ENCOUNTER — Ambulatory Visit (INDEPENDENT_AMBULATORY_CARE_PROVIDER_SITE_OTHER): Payer: Medicare Other | Admitting: Pulmonary Disease

## 2016-11-10 ENCOUNTER — Encounter: Payer: Self-pay | Admitting: Pulmonary Disease

## 2016-11-10 VITALS — BP 116/70 | HR 68 | Ht 62.0 in | Wt 285.0 lb

## 2016-11-10 DIAGNOSIS — Z6841 Body Mass Index (BMI) 40.0 and over, adult: Secondary | ICD-10-CM | POA: Diagnosis not present

## 2016-11-10 DIAGNOSIS — Z9989 Dependence on other enabling machines and devices: Secondary | ICD-10-CM | POA: Diagnosis not present

## 2016-11-10 DIAGNOSIS — G4733 Obstructive sleep apnea (adult) (pediatric): Secondary | ICD-10-CM

## 2016-11-10 NOTE — Progress Notes (Signed)
Current Outpatient Prescriptions on File Prior to Visit  Medication Sig  . albuterol (PROAIR HFA) 108 (90 BASE) MCG/ACT inhaler Inhale 2 puffs into the lungs every 6 (six) hours as needed for wheezing or shortness of breath. For shortness of breath  . ALPHAGAN P 0.1 % SOLN Apply 1 drop to eye 3 (three) times daily.   Marland Kitchen anastrozole (ARIMIDEX) 1 MG tablet Take 1 tablet (1 mg total) by mouth daily.  . cabergoline (DOSTINEX) 0.5 MG tablet Take 0.25 mg by mouth 2 (two) times a week.  . Cholecalciferol (VITAMIN D3) 1000 UNITS CAPS Take 1 capsule by mouth daily.  . cyanocobalamin 500 MCG tablet Take 500 mcg by mouth daily.  . dorzolamide (TRUSOPT) 2 % ophthalmic solution Place 1 drop into both eyes 3 (three) times daily.   . Fluticasone-Salmeterol (ADVAIR DISKUS) 100-50 MCG/DOSE AEPB Inhale 1 puff into the lungs 2 (two) times daily.   . furosemide (LASIX) 20 MG tablet Take 20 mg by mouth every morning.   Marland Kitchen GLUCOSAMINE HCL PO Take 1 tablet by mouth daily.  Marland Kitchen latanoprost (XALATAN) 0.005 % ophthalmic solution Place 1 drop into both eyes at bedtime.   . Multiple Vitamin (MULITIVITAMIN WITH MINERALS) TABS Take 1 tablet by mouth daily with breakfast.   . potassium chloride SA (K-DUR,KLOR-CON) 20 MEQ tablet Take 1 tablet (20 mEq total) by mouth 2 (two) times daily.  . simvastatin (ZOCOR) 20 MG tablet Take 20 mg by mouth daily with breakfast.   . valsartan-hydrochlorothiazide (DIOVAN-HCT) 160-25 MG per tablet Take 1 tablet by mouth daily with breakfast.    Current Facility-Administered Medications on File Prior to Visit  Medication  . lidocaine (cardiac) 100 mg/34ml (XYLOCAINE) 20 MG/ML injection 2%     Chief Complaint  Patient presents with  . Follow-up    DME-[facility]. Pt states that CPAP machine is working good but occ. does experience a dry mouth. Pt states that the mask is fitting good but the older it gets, does experience some leaks.     Sleep tests PSG 06/09/97 >> AHI 37 Auto CPAP 10/11/16 to  11/09/16 >> used on 30 of 30 nights with average 6 hrs 2 min.  Average AHI 1 with median CPAP 7 and 95 th percentile CPAP 10 cm H2O  Cardiac tests Echo 02/20/12 >> EF 65 to 69%, grade 2 diastolic dysfx  Past medical hx HTN, HLD, Asthma, Breast cancer 2013, Lymphedema Rt arm, Peripheral neuropathy, Glaucoma  Past surgical hx, Allergies, Family hx, Social hx all reviewed.  Vital Signs BP 116/70 (BP Location: Left Arm, Cuff Size: Normal)   Pulse 68   Ht 5\' 2"  (1.575 m)   Wt 285 lb (129.3 kg)   SpO2 97%   BMI 52.13 kg/m   History of Present Illness [name] is a 66 y.o. female with obstructive sleep apnea.  She is doing well with CPAP.  Occasional dry mouth.  Doesn't feel sleepy during the day.  No issues with mask fit.  Physical Exam  General - pleasant Eyes - pupils reactive ENT - no sinus tenderness, no oral exudate, no LAN Cardiac - regular, no murmur Chest - no wheeze, rales Abd - soft, non tender Ext - no edema Skin - no rashes Neuro - normal strength Psych - normal mood    Assessment/Plan  Obstructive sleep apnea. - she is compliant with CPAP and reports benefit  - continue auto CPAP  Obesity. - discussed importance of weight loss   Patient Instructions  Follow up  in 1 year    Chesley Mires, MD [place] Pulmonary/Critical Care/Sleep Pager:  [phone] 11/10/2016, 12:32 PM

## 2016-11-10 NOTE — Patient Instructions (Signed)
Follow up in 1 year.

## 2017-01-26 ENCOUNTER — Other Ambulatory Visit: Payer: Self-pay | Admitting: Hematology

## 2017-01-26 DIAGNOSIS — Z170 Estrogen receptor positive status [ER+]: Principal | ICD-10-CM

## 2017-01-26 DIAGNOSIS — C50311 Malignant neoplasm of lower-inner quadrant of right female breast: Secondary | ICD-10-CM

## 2017-01-29 NOTE — Telephone Encounter (Signed)
Left message for pt to call back concerning whether she has had a bone density test in order to fill her prescription for Arimedex

## 2017-01-30 ENCOUNTER — Other Ambulatory Visit: Payer: Self-pay | Admitting: *Deleted

## 2017-01-30 DIAGNOSIS — C50311 Malignant neoplasm of lower-inner quadrant of right female breast: Secondary | ICD-10-CM

## 2017-01-30 DIAGNOSIS — Z170 Estrogen receptor positive status [ER+]: Principal | ICD-10-CM

## 2017-01-30 MED ORDER — ANASTROZOLE 1 MG PO TABS: 1.0000 mg | ORAL_TABLET | Freq: Every day | ORAL | 0 refills | Status: DC

## 2017-01-30 NOTE — Telephone Encounter (Signed)
Pt returned call and said she had not had a bone density test but would schedule one ASAP

## 2017-02-02 ENCOUNTER — Other Ambulatory Visit: Payer: Self-pay | Admitting: *Deleted

## 2017-02-02 ENCOUNTER — Other Ambulatory Visit: Payer: Self-pay | Admitting: Hematology

## 2017-02-02 ENCOUNTER — Telehealth: Payer: Self-pay | Admitting: *Deleted

## 2017-02-02 DIAGNOSIS — C50311 Malignant neoplasm of lower-inner quadrant of right female breast: Secondary | ICD-10-CM

## 2017-02-02 DIAGNOSIS — E2839 Other primary ovarian failure: Secondary | ICD-10-CM

## 2017-02-02 DIAGNOSIS — Z170 Estrogen receptor positive status [ER+]: Principal | ICD-10-CM

## 2017-02-02 NOTE — Telephone Encounter (Signed)
Message left for pt to contact [facility] to complete scheduling for Bone Density Test.  Order has been completed by Dr Burr Medico.

## 2017-02-13 ENCOUNTER — Other Ambulatory Visit: Payer: Medicare Other

## 2017-02-18 NOTE — Progress Notes (Signed)
[facility]  [phone] [phone]  OFFICE PROGRESS NOTE   PCP:  Stephens Shire, M.D. SU: Neldon Mc, M.D. RAD ONC: Arloa Koh, M.D.   DIAGNOSIS: [name] is a 66 y.o. 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  PRIOR THERAPY: #1 The patient was originally seen at the multidisciplinary breast clinic on 08/05/2011 for a stage IIB, invasive ductal carcinoma (T3 NX MX).   #2  Status post right simple mastectomy with right axillary lymph node resection on 08/14/2011 with the final pathology revealing invasive and in situ ductal carcinoma. Two nodules were seen 3 cm and 2.2 cm.  Margins were negative.  There was focal lymphovascular space involvement by tumor. 1/15 sentinel lymph nodes were positive for metastatic disease.  The tumor was estrogen receptor positive 100%,  progesterone receptor positive 100%,  and HER-2/neu negative-no amplification by CISH with a ratio of 1.09, proliferation marker 53%. Final pathologic staging was stage IIB, pT2 pN1a, pMX.    #3.  S/P Adjuvant chemotherapy with Taxotere/Cytoxan  Q3 weeks beginning on 09/22/2011.  The patient is a participant in NSBP B-49 protocol.  She completed total of 6 cycles of Taxotere/Cytoxan given from 09/22/2011 -  01/05/2012.  #4   Status post radiation therapy to the right chest wall and axilla completed on 03/31/2012.  #5  She began adjuvant antiestrogen therapy with Arimidex 1 mg by mouth daily in 03/2012.  A total of 10 years of therapy is planned.  #6  Bone density scan:  in 03/2012 showed a T score of 1.1 (normal).  #7  Bone density scan: in 08/03/2014 showed a T score of -1.2 (low bone mass)  #8  Left breast mammogram: on 08/01/2016 showed no evidence of malignancy.   CURRENT THERAPY: Arimidex 1 mg daily started in 03/2012.  INTERVAL HISTORY:   [name] 66 y.o. female  returns for follow up. I last saw her 6 months ago. She presents to the clinic today reporting she is dong well. She denies anything new over the last 6 months other than changing her eye drops.  She has no issues with her anastrozole. She would like to get a prescription for her lymphedema sleeve for second nature. She also needs a prescription for her prothesis bra.  She notes having a cold that she has had for 3 days with over-the-counter medication. She denies having a fever. She thinks this contributed to her higher BP today. She denies any new pain. Overall she is well.    MEDICAL HISTORY: Past Medical History:  Diagnosis Date  . Arthritis   . Asthma   . Blood transfusion    hx of last one in 1987  . Breast cancer ([place])   . Cancer of lower-inner quadrant of female breast ([place]) 07/24/2011   right breast cancer /IDC,stage IIB,er/pr=+,her2=Neg  . Glaucoma   . Heart murmur   . History of chemotherapy    taxotere/cytoxan 6 cycles 09/22/11-01/05/12 day 2 neulasta  . Hyperlipemia   . Hypertension   . Lymphedema    right arm wears a sleeve  . MRSA (methicillin resistant staph aureus) culture positive    08/2011  . Neuromuscular disorder ([place])    peripheral neuropathy hands feet  . OSA (obstructive sleep apnea)    CPAP settings- ?   . Pituitary abnormality ([place]) 05-25-13   small pituitary growth-appears stable- Dr. Christella Noa follows    ALLERGIES:  is allergic to trandolapril-verapamil hcl er.   SURGICAL HISTORY:  Past Surgical History:  Procedure Laterality Date  . ABDOMINAL HYSTERECTOMY  2007   TAH/BSO  . BREAST SURGERY     mastectomy RIGHT  . CARPAL TUNNEL RELEASE Right    2012  . CHOLECYSTECTOMY  1987   open - Dr Lindon Romp  . COLONOSCOPY N/A 06/13/2013   Procedure: COLONOSCOPY;  Surgeon: Juanita Craver, MD;  Location: [facility];  Service: Endoscopy;  Laterality: N/A;  . DILATION AND CURETTAGE OF UTERUS     x3  . EYE SURGERY     laser eye surgery for glaucoma  . MASTECTOMY  MODIFIED RADICAL  08/14/11   right , ER/PR +, HER2 -  . port-a-cath insertion     port-a-cath removal 1'14  . PORT-A-CATH REMOVAL  06/22/2012   Procedure: MINOR REMOVAL PORT-A-CATH;  Surgeon: Haywood Lasso, MD;  Location: [facility];  Service: General;  Laterality: Left;  . PORTACATH PLACEMENT  09/11/2011   Procedure: INSERTION PORT-A-CATH;  Surgeon: Joyice Faster. Cornett, MD;  Location: [facility];  Service: General;  Laterality: N/A;  Insert of Port  . TUBAL LIGATION      REVIEW OF SYSTEMS:   A 10 point review of systems was conducted and is otherwise negative except for what is noted above.   Health Maintenance  Mammogram: 07/31/2015 normal  Colonoscopy: 05/2013 with Dr. Collene Mares, f/u in 5 years was recommended Bone Density Scan: 08/03/2014 Pap Smear: due in 01/2014, done by Dr. Leonie Douglas Exam: 01/18/2014 Vitamin D Level: followed by Dr. Rica Mote Lipid Panel: 05/2013, on Zocor, followed by PCP   PHYSICAL EXAMINATION:  BP (!) 184/90 (BP Location: Left Arm, Patient Position: Sitting) Comment: Notified Nurse of High BP  Pulse 79   Temp 98.7 F (37.1 C) (Oral)   Resp 16   Ht 5' 2"  (1.575 m)   Wt 276 lb 12.8 oz (125.6 kg)   SpO2 94%   BMI 50.63 kg/m    GENERAL: Patient is a well appearing female in no acute distress HEENT:  Sclerae anicteric.  Oropharynx clear and moist. No ulcerations or evidence of oropharyngeal candidiasis. Neck is supple.  NODES:  No cervical, supraclavicular, or axillary lymphadenopathy palpated.   LUNGS:  Clear to auscultation bilaterally.  No wheezes or rhonchi. HEART:  Regular rate and rhythm. No murmur appreciated. ABDOMEN:  Soft, nontender.  Positive, normoactive bowel sounds. No organomegaly palpated. MSK:  No focal spinal tenderness to palpation. Full range of motion bilaterally in the upper extremities. EXTREMITIES:  (+) Right arm with lymphedema sleeve. (+) Lower extremity edema below the knees, more so of the right leg. SKIN:  Clear with no  obvious rashes or skin changes. No nail dyscrasia. NEURO:  Nonfocal. Well oriented.  Appropriate affect. BREAST EXAM:  S/p right mastectomy, palpable mild scar tissue, no tenderness at the right rib cage. left breast without masses or nodules, axillary no palpable nodes   ECOG PERFORMANCE STATUS: 1 - Symptomatic but completely ambulatory  LABORATORY DATA: CBC Latest Ref Rng & Units 02/20/2017 08/15/2016 02/15/2016  WBC 3.9 - 10.3 10e3/uL 6.3 4.8 6.1  Hemoglobin 11.6 - 15.9 g/dL 12.4 12.6 11.7  Hematocrit 34.8 - 46.6 % 37.2 38.8 35.6  Platelets 145 - 400 10e3/uL 195 116(L) 189   CMP Latest Ref Rng & Units 02/20/2017 08/15/2016 02/15/2016  Glucose 70 - 140 mg/dl 92 81 94  BUN 7.0 - 26.0 mg/dL 17.7 18.0 20.7  Creatinine 0.6 - 1.1 mg/dL 0.9 0.9 1.0  Sodium 136 - 145 mEq/L 141 141 140  Potassium 3.5 - 5.1 mEq/L 3.7 4.1 3.8  Chloride 98 - 107 mEq/L - - -  CO2 22 - 29 mEq/L 29 25 27   Calcium 8.4 - 10.4 mg/dL 9.7 9.7 9.7  Total Protein 6.4 - 8.3 g/dL 7.4 7.1 7.5  Total Bilirubin 0.20 - 1.20 mg/dL 1.69(H) 1.35(H) 1.29(H)  Alkaline Phos 40 - 150 U/L 89 84 91  AST 5 - 34 U/L 15 11 14   ALT 0 - 55 U/L 11 12 11      Results for OMEGA, DURANTE (MRN [id number]) as of 08/12/2016 09:12  Ref. Range 06/21/2012 09:36 08/14/2015 09:00  Vitamin D, 25-Hydroxy Latest Ref Range: 30.0 - 100.0 ng/mL 45 50.2    RADIOLOGY:  MAMMOGRAM LEFT BREAST 08/01/2016 IMPRESSION: No evidence of malignancy. Stable benign mass within the lower left breast, presumed intramammary lymph node.  Mammogram 07/31/2015  FINDINGS: CC and MLO views the left breast with tomosynthesis were obtained. The circumscribed oval approximate 6 mm mass in the lower left breast, likely with a fatty hilus on the CC tomosynthesis images, is unchanged dating back to March, 2016. No new or suspicious findings elsewhere in the left breast. Mammographic images were processed with CAD. IMPRESSION: Stable likely benign 6 mm intramammary lymph node  in the lower left breast (without sonographic correlate) dating back to March, 2016. RECOMMENDATION: Diagnostic left mammogram in 1 year in order to confirm 2 years of stability.  Bone density scan 07/30/2014 FINDINGS: LEFT FOREARM (1/3 RADIUS) Bone Mineral Density (BMD): 0.623 Young Adult T Score: -1.2 Z Score: 0.5 ASSESSMENT: Patient's diagnostic category is LOW BONE MASS by WHO Criteria.  ASSESSMENT: [name] is a 66 y.o. female:  1. Right breast IDA, stage IIB, pT2 pN1aM0, estrogen receptor positive 100%,  progesterone receptor positive 100%,  and HER-2/neu negative - She is clinically doing very well without evidence of cancer recurrence. -Her recent screening mammogram showed a possible left breast mass. She is going to have additional image next week (was rescheduled). She will call me if biopsy is done. -She will continue Arimidex, she is tolerating well. We discussed the duration of adjuvant Arimidex, new clinical data supports less recurrence with extended 10 years of AI than 5 years, but no significant difference between 7 and 10 years of AI.  If she continues to tolerate it well, we'll plan for 7 years Arimidex (until 2020), given her stage IIB disease. -I encouraged her to continue healthy diet, exercise regularly, and take calcium and vitamin D for bone health, and try to lose some weight. -She is also being follow-up on the study of NSABP B49. -I discussed that she can continue taking anastrozole up to 7 years. We will f/u with her every year with yearly exams, mammogram and labs. I encouraged to continue to exercise and f/u with her PCP 1-2 times a year.  -She is clinically doing well overall. 2018 mammogram was normal. Her breast exam was unremarkable, her CBC and CMP were within normal limits except her total bilirubin was 1.69. We will monitor this in the future. She knows to contact us if she develops any new pains or other symptoms   -Next mammogram 07/2017  -F/u in  1 year  2. Osteopenia -Her bone density scan from March 2016 showed osteopenia, she has normal bone density in 2013 -We discussed that Arimidex may weaken her bone.  -I encouraged her to take calcium 1g and vitamin D 1000 units daily -Her vitamin  D level was checked in March 2017 and was normal. -Bone density scan due. Will have one performed in 02/2017  3.  Mild bilirubinemia  -She has intermittent mild elevation of bilirubin, overall stable, possibly related to medications. -We'll continue monitoring  4. HTN and obesity  -She'll continue follow-up with her primary care physician -I encouraged her to continue healthy diet, exercise and try to lose some weight   Plan -Second Nature Lymphedema sleeve and bra prosthesis prescriptions were given   -Continue anastrozole. I refilled today. -Return to clinic in 12 months with lab.  -Bone scan in 10/18, scheduled  -Next mammogram 07/2017   [name]  02/20/2017   This document serves as a record of services personally performed by Kathryn Merle, MD. It was created on her behalf by Joslyn Devon, a trained medical scribe. The creation of this record is based on the scribe's personal observations and the provider's statements to them. This document has been checked and approved by the attending provider.

## 2017-02-20 ENCOUNTER — Other Ambulatory Visit (HOSPITAL_BASED_OUTPATIENT_CLINIC_OR_DEPARTMENT_OTHER): Payer: Medicare Other

## 2017-02-20 ENCOUNTER — Encounter: Payer: Self-pay | Admitting: Hematology

## 2017-02-20 ENCOUNTER — Ambulatory Visit (HOSPITAL_BASED_OUTPATIENT_CLINIC_OR_DEPARTMENT_OTHER): Payer: Medicare Other | Admitting: Hematology

## 2017-02-20 ENCOUNTER — Other Ambulatory Visit: Payer: Self-pay | Admitting: Hematology

## 2017-02-20 ENCOUNTER — Telehealth: Payer: Self-pay | Admitting: Hematology

## 2017-02-20 VITALS — BP 184/90 | HR 79 | Temp 98.7°F | Resp 16 | Ht 62.0 in | Wt 276.8 lb

## 2017-02-20 DIAGNOSIS — M8580 Other specified disorders of bone density and structure, unspecified site: Secondary | ICD-10-CM

## 2017-02-20 DIAGNOSIS — E669 Obesity, unspecified: Secondary | ICD-10-CM

## 2017-02-20 DIAGNOSIS — Z170 Estrogen receptor positive status [ER+]: Secondary | ICD-10-CM | POA: Diagnosis not present

## 2017-02-20 DIAGNOSIS — C50911 Malignant neoplasm of unspecified site of right female breast: Secondary | ICD-10-CM | POA: Diagnosis present

## 2017-02-20 DIAGNOSIS — I10 Essential (primary) hypertension: Secondary | ICD-10-CM | POA: Diagnosis not present

## 2017-02-20 DIAGNOSIS — N6320 Unspecified lump in the left breast, unspecified quadrant: Secondary | ICD-10-CM

## 2017-02-20 DIAGNOSIS — Z79811 Long term (current) use of aromatase inhibitors: Secondary | ICD-10-CM | POA: Diagnosis not present

## 2017-02-20 DIAGNOSIS — C50311 Malignant neoplasm of lower-inner quadrant of right female breast: Secondary | ICD-10-CM

## 2017-02-20 LAB — CBC WITH DIFFERENTIAL/PLATELET
BASO%: 0.9 % (ref 0.0–2.0)
Basophils Absolute: 0.1 10*3/uL (ref 0.0–0.1)
EOS%: 3.4 % (ref 0.0–7.0)
Eosinophils Absolute: 0.2 10*3/uL (ref 0.0–0.5)
HCT: 37.2 % (ref 34.8–46.6)
HGB: 12.4 g/dL (ref 11.6–15.9)
LYMPH%: 38.5 % (ref 14.0–49.7)
MCH: 29.6 pg (ref 25.1–34.0)
MCHC: 33.3 g/dL (ref 31.5–36.0)
MCV: 88.8 fL (ref 79.5–101.0)
MONO#: 0.5 10*3/uL (ref 0.1–0.9)
MONO%: 7.1 % (ref 0.0–14.0)
NEUT#: 3.2 10*3/uL (ref 1.5–6.5)
NEUT%: 50.1 % (ref 38.4–76.8)
PLATELETS: 195 10*3/uL (ref 145–400)
RBC: 4.19 10*6/uL (ref 3.70–5.45)
RDW: 14.8 % — ABNORMAL HIGH (ref 11.2–14.5)
WBC: 6.3 10*3/uL (ref 3.9–10.3)
lymph#: 2.4 10*3/uL (ref 0.9–3.3)

## 2017-02-20 LAB — COMPREHENSIVE METABOLIC PANEL
ALBUMIN: 3.9 g/dL (ref 3.5–5.0)
ALK PHOS: 89 U/L (ref 40–150)
ALT: 11 U/L (ref 0–55)
AST: 15 U/L (ref 5–34)
Anion Gap: 8 mEq/L (ref 3–11)
BUN: 17.7 mg/dL (ref 7.0–26.0)
CO2: 29 mEq/L (ref 22–29)
Calcium: 9.7 mg/dL (ref 8.4–10.4)
Chloride: 103 mEq/L (ref 98–109)
Creatinine: 0.9 mg/dL (ref 0.6–1.1)
EGFR: 76 mL/min/{1.73_m2} — AB (ref >90)
Glucose: 92 mg/dl (ref 70–140)
POTASSIUM: 3.7 meq/L (ref 3.5–5.1)
Sodium: 141 mEq/L (ref 136–145)
Total Bilirubin: 1.69 mg/dL — ABNORMAL HIGH (ref 0.20–1.20)
Total Protein: 7.4 g/dL (ref 6.4–8.3)

## 2017-02-20 MED ORDER — ANASTROZOLE 1 MG PO TABS: 1.0000 mg | ORAL_TABLET | Freq: Every day | ORAL | 3 refills | Status: DC

## 2017-02-20 NOTE — Telephone Encounter (Signed)
Gave avs and calendar for September 2019 °

## 2017-02-23 ENCOUNTER — Ambulatory Visit
Admission: RE | Admit: 2017-02-23 | Discharge: 2017-02-23 | Disposition: A | Discharge status: Home / Self Care | Payer: Medicare Other | Source: Ambulatory Visit | Attending: Hematology | Admitting: Hematology

## 2017-02-23 DIAGNOSIS — E2839 Other primary ovarian failure: Secondary | ICD-10-CM

## 2017-04-08 ENCOUNTER — Other Ambulatory Visit: Payer: Self-pay | Admitting: Neurosurgery

## 2017-04-08 DIAGNOSIS — D352 Benign neoplasm of pituitary gland: Secondary | ICD-10-CM

## 2017-04-28 ENCOUNTER — Ambulatory Visit
Admission: RE | Admit: 2017-04-28 | Discharge: 2017-04-28 | Disposition: A | Discharge status: Home / Self Care | Payer: Medicare Other | Source: Ambulatory Visit | Attending: Neurosurgery | Admitting: Neurosurgery

## 2017-04-28 DIAGNOSIS — D352 Benign neoplasm of pituitary gland: Secondary | ICD-10-CM

## 2017-04-28 MED ORDER — GADOBENATE DIMEGLUMINE 529 MG/ML IV SOLN
10.0000 mL | Freq: Once | INTRAVENOUS | Status: AC | PRN
  Administered 2017-04-28: 10 mL via INTRAVENOUS

## 2017-04-30 ENCOUNTER — Emergency Department (HOSPITAL_COMMUNITY)
Admission: EM | Admit: 2017-04-30 | Discharge: 2017-04-30 | Disposition: A | Discharge status: Home / Self Care | Payer: Medicare Other | Attending: Emergency Medicine | Admitting: Emergency Medicine

## 2017-04-30 ENCOUNTER — Emergency Department (HOSPITAL_COMMUNITY): Payer: Medicare Other

## 2017-04-30 ENCOUNTER — Encounter (HOSPITAL_COMMUNITY): Payer: Self-pay

## 2017-04-30 DIAGNOSIS — Z79899 Other long term (current) drug therapy: Secondary | ICD-10-CM | POA: Diagnosis not present

## 2017-04-30 DIAGNOSIS — J45909 Unspecified asthma, uncomplicated: Secondary | ICD-10-CM | POA: Diagnosis not present

## 2017-04-30 DIAGNOSIS — I10 Essential (primary) hypertension: Secondary | ICD-10-CM | POA: Insufficient documentation

## 2017-04-30 DIAGNOSIS — I4891 Unspecified atrial fibrillation: Secondary | ICD-10-CM | POA: Diagnosis not present

## 2017-04-30 DIAGNOSIS — R42 Dizziness and giddiness: Secondary | ICD-10-CM | POA: Diagnosis present

## 2017-04-30 DIAGNOSIS — Z853 Personal history of malignant neoplasm of breast: Secondary | ICD-10-CM | POA: Diagnosis not present

## 2017-04-30 DIAGNOSIS — Z7901 Long term (current) use of anticoagulants: Secondary | ICD-10-CM | POA: Diagnosis not present

## 2017-04-30 DIAGNOSIS — Z9221 Personal history of antineoplastic chemotherapy: Secondary | ICD-10-CM | POA: Insufficient documentation

## 2017-04-30 LAB — TSH: TSH: 4.681 u[IU]/mL — ABNORMAL HIGH (ref 0.350–4.500)

## 2017-04-30 LAB — BASIC METABOLIC PANEL
Anion gap: 9 (ref 5–15)
BUN: 17 mg/dL (ref 6–20)
CHLORIDE: 103 mmol/L (ref 101–111)
CO2: 26 mmol/L (ref 22–32)
CREATININE: 1.15 mg/dL — AB (ref 0.44–1.00)
Calcium: 9.5 mg/dL (ref 8.9–10.3)
GFR calc Af Amer: 55 mL/min — ABNORMAL LOW (ref >60)
GFR calc non Af Amer: 48 mL/min — ABNORMAL LOW (ref >60)
GLUCOSE: 111 mg/dL — AB (ref 65–99)
POTASSIUM: 3.4 mmol/L — AB (ref 3.5–5.1)
SODIUM: 138 mmol/L (ref 135–145)

## 2017-04-30 LAB — CBC
HEMATOCRIT: 41.1 % (ref 36.0–46.0)
Hemoglobin: 13.5 g/dL (ref 12.0–15.0)
MCH: 29.7 pg (ref 26.0–34.0)
MCHC: 32.8 g/dL (ref 30.0–36.0)
MCV: 90.5 fL (ref 78.0–100.0)
PLATELETS: 200 10*3/uL (ref 150–400)
RBC: 4.54 MIL/uL (ref 3.87–5.11)
RDW: 14.1 % (ref 11.5–15.5)
WBC: 7 10*3/uL (ref 4.0–10.5)

## 2017-04-30 LAB — T4, FREE: Free T4: 0.91 ng/dL (ref 0.61–1.12)

## 2017-04-30 MED ORDER — RIVAROXABAN 20 MG PO TABS: 20.0000 mg | ORAL_TABLET | Freq: Every day | ORAL | 0 refills | Status: DC

## 2017-04-30 MED ORDER — RIVAROXABAN 20 MG PO TABS
20.0000 mg | ORAL_TABLET | Freq: Once | ORAL | Status: AC
  Administered 2017-04-30: 20 mg via ORAL
  Filled 2017-04-30: qty 1

## 2017-04-30 MED ORDER — METOPROLOL SUCCINATE ER 25 MG PO TB24: 12.5000 mg | ORAL_TABLET | Freq: Every day | ORAL | 0 refills | Status: DC

## 2017-04-30 MED ORDER — POTASSIUM CHLORIDE CRYS ER 20 MEQ PO TBCR
40.0000 meq | EXTENDED_RELEASE_TABLET | Freq: Once | ORAL | Status: AC
  Administered 2017-04-30: 40 meq via ORAL
  Filled 2017-04-30: qty 2

## 2017-04-30 MED ORDER — RIVAROXABAN (XARELTO) EDUCATION KIT FOR AFIB PATIENTS
PACK | Freq: Once | Status: AC
  Administered 2017-04-30
  Filled 2017-04-30: qty 1

## 2017-04-30 NOTE — ED Triage Notes (Signed)
Pt arrives via EMS for episode of lightheadedness onset tonight for about 5 minutes; Patient presented to EMS with new onset of Afib at 80-140's HR;patient has hx of right breast CA with mastectomy; Pt able to be transported to the bathroom on arrival via wheelchair; Patient is A&Ox 4 on arrival. Patient has recent death in family and believes she is just under a lot of stress; EDP at bedside on arrival. *No stick on Right side * Patient denies pain on arrival. -[facility]

## 2017-04-30 NOTE — Discharge Instructions (Signed)
Please call the A. fib clinic tomorrow to set up an appointment.

## 2017-04-30 NOTE — ED Notes (Signed)
Called lab, they do not need any additional blood for the t3&t4labs

## 2017-04-30 NOTE — ED Provider Notes (Signed)
[facility] EMERGENCY DEPARTMENT Provider Note   CSN: [id number] Arrival date & time: 04/30/17  1940     History   Chief Complaint Chief Complaint  Patient presents with  . Dizziness  . Atrial Fibrillation    HPI [name] is a 66 y.o. female.  HPI 68yoF with a hx of breast CA s/p R mastectomy not currently undergoing chemo or radiation, HLD, HTN, and a small benign pituitary mass presenting with an episode of lightheadedness and dizziness while eating dinner that lasted 5-10 minutes and then gradually resolved.  She denied any chest pain shortness of breath associated with this episode.  Denies any palpitations or chest pain.  EMS noted that she had an irregularly irregular rhythm.  She denies any symptoms at this time.  No history of DVT or PE.  Patient endorses family stress after her brother died recently.  Denies pleuritic pain.  No recent leg swelling, pain, or recent travel.  Past Medical History:  Diagnosis Date  . Arthritis   . Asthma   . Blood transfusion    hx of last one in 1987  . Breast cancer ([place])   . Cancer of lower-inner quadrant of female breast ([place]) 07/24/2011   right breast cancer /IDC,stage IIB,er/pr=+,her2=Neg  . Glaucoma   . Heart murmur   . History of chemotherapy    taxotere/cytoxan 6 cycles 09/22/11-01/05/12 day 2 neulasta  . Hyperlipemia   . Hypertension   . Lymphedema    right arm wears a sleeve  . MRSA (methicillin resistant staph aureus) culture positive    08/2011  . Neuromuscular disorder ([place])    peripheral neuropathy hands feet  . OSA (obstructive sleep apnea)    CPAP settings- ?   . Pituitary abnormality ([place]) 05-25-13   small pituitary growth-appears stable- Dr. Christella Noa follows    Patient Active Problem List   Diagnosis Date Noted  . Osteopenia 02/15/2016  . Obesity 01/19/2014  . Neuromuscular disorder ([place])   . Breast cancer of lower-inner quadrant of right female breast ([place]) 07/24/2011  . Murmur  06/17/2011  . Mixed hyperlipidemia 06/17/2011  . Obstructive sleep apnea 11/16/2008  . HYPERTENSION 11/16/2008    Past Surgical History:  Procedure Laterality Date  . ABDOMINAL HYSTERECTOMY  2007   TAH/BSO  . BREAST SURGERY     mastectomy RIGHT  . CARPAL TUNNEL RELEASE Right    2012  . CHOLECYSTECTOMY  1987   open - Dr Lindon Romp  . COLONOSCOPY N/A 06/13/2013   Procedure: COLONOSCOPY;  Surgeon: Juanita Craver, MD;  [facility];  Service: Endoscopy;  Laterality: N/A;  . DILATION AND CURETTAGE OF UTERUS     x3  . EYE SURGERY     laser eye surgery for glaucoma  . MASTECTOMY MODIFIED RADICAL  08/14/11   right , ER/PR +, HER2 -  . port-a-cath insertion     port-a-cath removal 1'14  . PORT-A-CATH REMOVAL  06/22/2012   Procedure: MINOR REMOVAL PORT-A-CATH;  Surgeon: Haywood Lasso, MD;  Location: [facility];  Service: General;  Laterality: Left;  . PORTACATH PLACEMENT  09/11/2011   Procedure: INSERTION PORT-A-CATH;  Surgeon: Joyice Faster. Cornett, MD;  Location[facility];  Service: General;  Laterality: N/A;  Insert of Port  . TUBAL LIGATION      OB History    No data available       Home Medications    Prior to Admission medications   Medication Sig Start Date End Date Taking?  Authorizing Provider  albuterol (PROAIR HFA) 108 (90 Base) MCG/ACT inhaler Inhale 2 puffs into the lungs every 6 (six) hours as needed for wheezing or shortness of breath. For shortness of breath   Yes [provider]  amLODipine (NORVASC) 2.5 MG tablet Take 2.5 mg by mouth daily.   Yes [provider]  anastrozole (ARIMIDEX) 1 MG tablet Take 1 tablet (1 mg total) by mouth daily. 02/20/17  Yes Truitt Merle, MD  cabergoline (DOSTINEX) 0.5 MG tablet Take 0.5 mg by mouth 2 (two) times a week. Tuesday and Thursday   Yes [provider]  Cholecalciferol (VITAMIN D3) 1000 UNITS CAPS Take 1,000 Units by mouth every other day.    Yes [provider]  cyanocobalamin  500 MCG tablet Take 500 mcg by mouth daily.   Yes [provider]  Fluticasone-Salmeterol (ADVAIR DISKUS) 100-50 MCG/DOSE AEPB Inhale 1 puff into the lungs 2 (two) times daily.    Yes [provider]  furosemide (LASIX) 20 MG tablet Take 20 mg by mouth every morning.    Yes [provider]  GLUCOSAMINE HCL PO Take 1 tablet by mouth daily.   Yes [provider]  losartan-hydrochlorothiazide (HYZAAR) 100-25 MG tablet Take 1 tablet by mouth daily.  12/31/16  Yes [provider]  Multiple Vitamin (MULITIVITAMIN WITH MINERALS) TABS Take 1 tablet by mouth daily with breakfast.    Yes [provider]  naproxen sodium (ALEVE) 220 MG tablet Take 220-440 mg by mouth 2 (two) times daily as needed (for pain or headaches).   Yes [provider]  SIMBRINZA 1-0.2 % SUSP Place 1 drop into both eyes 3 (three) times daily.  02/03/17  Yes [provider]  simvastatin (ZOCOR) 20 MG tablet Take 20 mg by mouth at bedtime.    Yes [provider]  TRAVATAN Z 0.004 % SOLN ophthalmic solution Place 1 drop into both eyes every evening.  12/10/16  Yes [provider]  metoprolol succinate (TOPROL-XL) 25 MG 24 hr tablet Take 0.5 tablets (12.5 mg total) by mouth daily. 04/30/17 05/30/17  Denessa Cavan Mali, MD  potassium chloride SA (K-DUR,KLOR-CON) 20 MEQ tablet Take 1 tablet (20 mEq total) by mouth 2 (two) times daily. Patient not taking: Reported on 04/30/2017 08/14/15   Truitt Merle, MD  rivaroxaban (XARELTO) 20 MG TABS tablet Take 1 tablet (20 mg total) by mouth daily with supper. 04/30/17   Keairra Bardon Mali, MD    Family History Family History  Problem Relation Age of Onset  . Cancer Father 31       prostate ca  . Arthritis Mother     Social History Social History   Tobacco Use  . Smoking status: Never Smoker  . Smokeless tobacco: Never Used  Substance Use Topics  . Alcohol use: No  . Drug use: No     Allergies     Trandolapril-verapamil hcl er   Review of Systems Review of Systems  Constitutional: Negative for chills and fever.  HENT: Negative for ear pain and sore throat.   Eyes: Negative for pain and visual disturbance.  Respiratory: Negative for cough and shortness of breath.   Cardiovascular: Negative for chest pain and palpitations.  Gastrointestinal: Negative for abdominal pain and vomiting.  Genitourinary: Negative for dysuria and hematuria.  Musculoskeletal: Negative for arthralgias and back pain.  Skin: Negative for color change and rash.  Neurological: Positive for dizziness and light-headedness. Negative for seizures and syncope.  All other systems reviewed and are  negative.    Physical Exam Updated Vital Signs BP 121/80   Pulse 97   Temp 98.2 F (36.8 C) (Oral)   Resp (!) 21   Ht 5' 2" (1.575 m)   Wt 116.1 kg (256 lb)   SpO2 98%   BMI 46.82 kg/m   Physical Exam  Constitutional: She is oriented to person, place, and time. She appears well-developed and well-nourished. No distress.  HENT:  Head: Normocephalic and atraumatic.  Eyes: Conjunctivae are normal.  Neck: Neck supple.  Cardiovascular: Normal heart sounds, intact distal pulses and normal pulses. An irregularly irregular rhythm present. Tachycardia present.  No murmur heard. Pulmonary/Chest: Effort normal and breath sounds normal. No tachypnea. No respiratory distress. She has no decreased breath sounds.  Abdominal: Soft. There is no tenderness.  Musculoskeletal: Normal range of motion. She exhibits no edema or tenderness.  Neurological: She is alert and oriented to person, place, and time. She has normal strength. No cranial nerve deficit or sensory deficit. GCS eye subscore is 4. GCS verbal subscore is 5. GCS motor subscore is 6.  Skin: Skin is warm and dry.  Psychiatric: She has a normal mood and affect.  Nursing note and vitals reviewed.    ED Treatments / Results  Labs (all labs ordered are listed, but  only abnormal results are displayed) Labs Reviewed  BASIC METABOLIC PANEL - Abnormal; Notable for the following components:      Result Value   Potassium 3.4 (*)    Glucose, Bld 111 (*)    Creatinine, Ser 1.15 (*)    GFR calc non Af Amer 48 (*)    GFR calc Af Amer 55 (*)    All other components within normal limits  TSH - Abnormal; Notable for the following components:   TSH 4.681 (*)    All other components within normal limits  CBC  T4, FREE  T3, FREE    EKG  EKG Interpretation  Date/Time:  Thursday April 30 2017 19:59:35 EST Ventricular Rate:  113 PR Interval:    QRS Duration: 72 QT Interval:  316 QTC Calculation: 414 R Axis:   63 Text Interpretation:  Atrial fibrillation Ventricular premature complex Since prior ECG, patient is now in atrial fibrillation Confirmed by Gareth Morgan [phone]) on 04/30/2017 8:06:19 PM       Radiology Dg Chest 2 View  Result Date: 04/30/2017 CLINICAL DATA:  Atrial fibrillation. EXAM: CHEST  2 VIEW COMPARISON:  01/29/2012 FINDINGS: Enlarged cardiac silhouette. Mediastinal contours appear intact. Torturous thoracic aorta. There is no evidence of focal airspace consolidation, pleural effusion or pneumothorax. Osseous structures are without acute abnormality. Postsurgical changes of right mastectomy. IMPRESSION: Enlarged cardiac silhouette. Tortuosity of the thoracic aorta. No evidence of focal consolidation or pulmonary edema. Electronically Signed   By: Fidela Salisbury M.D.   On: 04/30/2017 20:41    Procedures Procedures (including critical care time)  Medications Ordered in ED Medications  rivaroxaban (XARELTO) tablet 20 mg (20 mg Oral Given 04/30/17 2325)  potassium chloride SA (K-DUR,KLOR-CON) CR tablet 40 mEq (40 mEq Oral Given 04/30/17 2238)  rivaroxaban [employer] Reasons) Education Kit for Afib patients ( Does not apply Given 04/30/17 2331)     Initial Impression / Assessment and Plan / ED Course  I have reviewed the triage vital  signs and the nursing notes.  Pertinent labs & imaging results that were available during my care of the patient were reviewed by me and considered in my medical decision making (see chart for details).  66 year old female with the above history presenting with new onset A. fib associated with lightheadedness and dizziness that lasted approximately 5-10 minutes 45 minutes prior to arrival.  Denied any chest pain, shortness of breath, pleuritic pain or DVT symptoms.  She is normotensive with a heart rate around 100.  EKG shows A. fib with no ischemic changes.  This is new from prior EKGs.  Denies any symptoms or palpitations at this time.  CBC, BMP, TSH and chest x-ray ordered.  Chads-vasc score 3.  No evidence of DVT on exam and no chest pain, shortness of breath or hypoxia therefore doubt PE.  No fever, leukocytosis or other infectious symptoms.  Labs unremarkable and renal function at baseline.  TSH mildly elevated at 4.6 therefore free T4 and T3 ordered.  Chest x-ray with no focal airspace consolidation or pneumothorax or pulmonary edema. As patient is asymptomatic and only had symptoms for 5-10 minutes, cannot confidently determine when her A. fib started.  She is not currently in RVR therefore do not need to rate control..  As her chads-vasc is 3, will start on anticoagulation and have the patient follow-up in A. fib clinic.  She will be sent home on Xarelto and a small dose of metoprolol to hopefully prevent her developing RVR.  We will have the patient follow-up with her PCP for results of T4 T3 levels.  Patient was given return precautions and instructions on anticoagulation.  Patient amenable with this plan.  Final Clinical Impressions(s) / ED Diagnoses   Final diagnoses:  Atrial fibrillation, unspecified type [facility])    ED Discharge Orders        Ordered    rivaroxaban (XARELTO) 20 MG TABS tablet  Daily with supper     04/30/17 2233    metoprolol succinate (TOPROL-XL) 25 MG 24 hr  tablet  Daily     04/30/17 2319       Page, Nathan Mali, MD 05/01/17 [id number]    Gareth Morgan, MD 05/04/17 1820

## 2017-05-02 LAB — T3, FREE: T3 FREE: 2.9 pg/mL (ref 2.0–4.4)

## 2017-05-05 ENCOUNTER — Telehealth (HOSPITAL_COMMUNITY): Payer: Self-pay | Admitting: *Deleted

## 2017-05-05 NOTE — Telephone Encounter (Signed)
Pt on Afib ED follow up report.  Pt was referred to afib clinic per [facility] per discharge note.  LMOM for pt to [facility] to sched

## 2017-05-22 ENCOUNTER — Ambulatory Visit (HOSPITAL_COMMUNITY)
Admission: RE | Admit: 2017-05-22 | Discharge: 2017-05-22 | Disposition: A | Discharge status: Home / Self Care | Payer: Medicare Other | Source: Ambulatory Visit | Attending: Nurse Practitioner | Admitting: Nurse Practitioner

## 2017-05-22 ENCOUNTER — Encounter (HOSPITAL_COMMUNITY): Payer: Self-pay | Admitting: Nurse Practitioner

## 2017-05-22 VITALS — BP 126/72 | Ht 62.0 in | Wt 285.0 lb

## 2017-05-22 DIAGNOSIS — I481 Persistent atrial fibrillation: Secondary | ICD-10-CM

## 2017-05-22 DIAGNOSIS — Z9221 Personal history of antineoplastic chemotherapy: Secondary | ICD-10-CM | POA: Diagnosis not present

## 2017-05-22 DIAGNOSIS — J45909 Unspecified asthma, uncomplicated: Secondary | ICD-10-CM | POA: Insufficient documentation

## 2017-05-22 DIAGNOSIS — Z90710 Acquired absence of both cervix and uterus: Secondary | ICD-10-CM | POA: Diagnosis not present

## 2017-05-22 DIAGNOSIS — Z90722 Acquired absence of ovaries, bilateral: Secondary | ICD-10-CM | POA: Diagnosis not present

## 2017-05-22 DIAGNOSIS — G4733 Obstructive sleep apnea (adult) (pediatric): Secondary | ICD-10-CM | POA: Diagnosis not present

## 2017-05-22 DIAGNOSIS — Z923 Personal history of irradiation: Secondary | ICD-10-CM | POA: Diagnosis not present

## 2017-05-22 DIAGNOSIS — E785 Hyperlipidemia, unspecified: Secondary | ICD-10-CM | POA: Diagnosis not present

## 2017-05-22 DIAGNOSIS — Z888 Allergy status to other drugs, medicaments and biological substances status: Secondary | ICD-10-CM | POA: Diagnosis not present

## 2017-05-22 DIAGNOSIS — H409 Unspecified glaucoma: Secondary | ICD-10-CM | POA: Diagnosis not present

## 2017-05-22 DIAGNOSIS — I10 Essential (primary) hypertension: Secondary | ICD-10-CM | POA: Diagnosis not present

## 2017-05-22 DIAGNOSIS — Z9011 Acquired absence of right breast and nipple: Secondary | ICD-10-CM | POA: Insufficient documentation

## 2017-05-22 DIAGNOSIS — Z853 Personal history of malignant neoplasm of breast: Secondary | ICD-10-CM | POA: Diagnosis not present

## 2017-05-22 DIAGNOSIS — Z79899 Other long term (current) drug therapy: Secondary | ICD-10-CM | POA: Diagnosis not present

## 2017-05-22 DIAGNOSIS — M1990 Unspecified osteoarthritis, unspecified site: Secondary | ICD-10-CM | POA: Diagnosis not present

## 2017-05-22 DIAGNOSIS — Z8261 Family history of arthritis: Secondary | ICD-10-CM | POA: Insufficient documentation

## 2017-05-22 DIAGNOSIS — G629 Polyneuropathy, unspecified: Secondary | ICD-10-CM | POA: Diagnosis not present

## 2017-05-22 DIAGNOSIS — Z7951 Long term (current) use of inhaled steroids: Secondary | ICD-10-CM | POA: Diagnosis not present

## 2017-05-22 DIAGNOSIS — Z79811 Long term (current) use of aromatase inhibitors: Secondary | ICD-10-CM | POA: Diagnosis not present

## 2017-05-22 DIAGNOSIS — I4819 Other persistent atrial fibrillation: Secondary | ICD-10-CM

## 2017-05-22 DIAGNOSIS — Z7901 Long term (current) use of anticoagulants: Secondary | ICD-10-CM | POA: Diagnosis not present

## 2017-05-22 DIAGNOSIS — Z8042 Family history of malignant neoplasm of prostate: Secondary | ICD-10-CM | POA: Diagnosis not present

## 2017-05-22 DIAGNOSIS — E237 Disorder of pituitary gland, unspecified: Secondary | ICD-10-CM | POA: Diagnosis not present

## 2017-05-22 DIAGNOSIS — Z9049 Acquired absence of other specified parts of digestive tract: Secondary | ICD-10-CM | POA: Insufficient documentation

## 2017-05-22 LAB — CBC
HEMATOCRIT: 34.9 % — AB (ref 36.0–46.0)
Hemoglobin: 11 g/dL — ABNORMAL LOW (ref 12.0–15.0)
MCH: 28.7 pg (ref 26.0–34.0)
MCHC: 31.5 g/dL (ref 30.0–36.0)
MCV: 91.1 fL (ref 78.0–100.0)
Platelets: 175 10*3/uL (ref 150–400)
RBC: 3.83 MIL/uL — AB (ref 3.87–5.11)
RDW: 14.4 % (ref 11.5–15.5)
WBC: 6.3 10*3/uL (ref 4.0–10.5)

## 2017-05-22 LAB — BASIC METABOLIC PANEL
Anion gap: 10 (ref 5–15)
BUN: 16 mg/dL (ref 6–20)
CHLORIDE: 101 mmol/L (ref 101–111)
CO2: 27 mmol/L (ref 22–32)
CREATININE: 1.11 mg/dL — AB (ref 0.44–1.00)
Calcium: 9.1 mg/dL (ref 8.9–10.3)
GFR calc Af Amer: 58 mL/min — ABNORMAL LOW (ref >60)
GFR calc non Af Amer: 50 mL/min — ABNORMAL LOW (ref >60)
Glucose, Bld: 96 mg/dL (ref 65–99)
POTASSIUM: 3.6 mmol/L (ref 3.5–5.1)
SODIUM: 138 mmol/L (ref 135–145)

## 2017-05-22 MED ORDER — RIVAROXABAN 20 MG PO TABS: 20.0000 mg | ORAL_TABLET | Freq: Every day | ORAL | 2 refills | Status: DC

## 2017-05-22 MED ORDER — METOPROLOL SUCCINATE ER 25 MG PO TB24: 12.5000 mg | ORAL_TABLET | Freq: Every day | ORAL | 2 refills | Status: DC

## 2017-05-22 MED ORDER — METOPROLOL SUCCINATE ER 25 MG PO TB24: 12.5000 mg | ORAL_TABLET | Freq: Every day | ORAL | 3 refills | Status: DC

## 2017-05-22 NOTE — H&P (View-Only) (Signed)
 Primary Care Physician: [name], [name], MD Referring Physician: [facility] ER f/u   [name] is a 66 y.o. female with a h/o of breast CA s/p R mastectomy not currently undergoing chemo or radiation, HLD, HTN, and a small benign pituitary mass presenting with an episode of lightheadedness and dizziness while eating dinner that lasted 5-10 minutes and then gradually resolved.  She denied any chest pain shortness of breath associated with this episode.  Denies any palpitations or chest pain.  EMS noted that she had an irregularly irregular rhythm.   Patient endorses family stress after her brother died recently. She was started on low dose BB and xarelto 20 mg daily, 12/6, which she has taken without missed doses.  In the afib clinic, 12/28, she feels well, although she continues in afib. She does  not smoke, drink alcohol, or excessive amounts of caffeine. Last echo was in 2013 when she had treatment for Rt breast CA with chemo and radiation. She does have OSA but uses CPAP.  Today, she denies symptoms of palpitations, chest pain, shortness of breath, orthopnea, PND, lower extremity edema, dizziness, presyncope, syncope, or neurologic sequela. The patient is tolerating medications without difficulties and is otherwise without complaint today.   Past Medical History:  Diagnosis Date  . Arthritis   . Asthma   . Blood transfusion    hx of last one in 1987  . Breast cancer (HCC)   . Cancer of lower-inner quadrant of female breast (HCC) 07/24/2011   right breast cancer /IDC,stage IIB,er/pr=+,her2=Neg  . Glaucoma   . Heart murmur   . History of chemotherapy    taxotere/cytoxan 6 cycles 09/22/11-01/05/12 day 2 neulasta  . Hyperlipemia   . Hypertension   . Lymphedema    right arm wears a sleeve  . MRSA (methicillin resistant staph aureus) culture positive    08/2011  . Neuromuscular disorder (HCC)    peripheral neuropathy hands feet  . OSA (obstructive sleep apnea)    CPAP settings- ?   .  Pituitary abnormality (HCC) 05-25-13   small pituitary growth-appears stable- Dr. Cabbell follows   Past Surgical History:  Procedure Laterality Date  . ABDOMINAL HYSTERECTOMY  2007   TAH/BSO  . BREAST SURGERY     mastectomy RIGHT  . CARPAL TUNNEL RELEASE Right    2012  . CHOLECYSTECTOMY  1987   open - Dr Arkin  . COLONOSCOPY N/A 06/13/2013   Procedure: COLONOSCOPY;  Surgeon: Jyothi Mann, MD;  Location: [facility];  Service: Endoscopy;  Laterality: N/A;  . DILATION AND CURETTAGE OF UTERUS     x3  . EYE SURGERY     laser eye surgery for glaucoma  . MASTECTOMY MODIFIED RADICAL  08/14/11   right , ER/PR +, HER2 -  . port-a-cath insertion     port-a-cath removal 1'14  . PORT-A-CATH REMOVAL  06/22/2012   Procedure: MINOR REMOVAL PORT-A-CATH;  Surgeon: Christian J Streck, MD;  Location: [facility];  Service: General;  Laterality: Left;  . PORTACATH PLACEMENT  09/11/2011   Procedure: INSERTION PORT-A-CATH;  Surgeon: Thomas A. Cornett, MD;  Location: [facility];  Service: General;  Laterality: N/A;  Insert of Port  . TUBAL LIGATION      Current Outpatient Medications  Medication Sig Dispense Refill  . albuterol (PROAIR HFA) 108 (90 Base) MCG/ACT inhaler Inhale 2 puffs into the lungs every 6 (six) hours as needed for wheezing or shortness of breath. For shortness of breath    .   amLODipine (NORVASC) 2.5 MG tablet Take 2.5 mg by mouth daily.    . anastrozole (ARIMIDEX) 1 MG tablet Take 1 tablet (1 mg total) by mouth daily. 90 tablet 3  . cabergoline (DOSTINEX) 0.5 MG tablet Take 0.5 mg by mouth 2 (two) times a week. Tuesday and Thursday    . Cholecalciferol (VITAMIN D3) 1000 UNITS CAPS Take 1,000 Units by mouth every other day.     . Fluticasone-Salmeterol (ADVAIR DISKUS) 100-50 MCG/DOSE AEPB Inhale 1 puff into the lungs daily.     . furosemide (LASIX) 20 MG tablet Take 20 mg by mouth daily as needed for fluid.     . GLUCOSAMINE HCL PO Take 1 tablet by mouth daily.    .  losartan-hydrochlorothiazide (HYZAAR) 100-25 MG tablet Take 1 tablet by mouth daily.     . metoprolol succinate (TOPROL-XL) 25 MG 24 hr tablet Take 0.5 tablets (12.5 mg total) by mouth daily. 45 tablet 2  . Multiple Vitamin (MULITIVITAMIN WITH MINERALS) TABS Take 1 tablet by mouth daily with breakfast.     . potassium chloride SA (K-DUR,KLOR-CON) 20 MEQ tablet Take 1 tablet (20 mEq total) by mouth 2 (two) times daily. (Patient taking differently: Take 20 mEq by mouth daily as needed (with lasix for fluid retention.). ) 30 tablet 0  . rivaroxaban (XARELTO) 20 MG TABS tablet Take 1 tablet (20 mg total) by mouth daily with supper. 90 tablet 2  . SIMBRINZA 1-0.2 % SUSP Place 1 drop into both eyes 3 (three) times daily.     . simvastatin (ZOCOR) 20 MG tablet Take 20 mg by mouth at bedtime.     . TRAVATAN Z 0.004 % SOLN ophthalmic solution Place 1 drop into both eyes every evening.     . acetaminophen (TYLENOL) 325 MG tablet Take 325-650 mg by mouth every 6 (six) hours as needed (for pain.).    . hydroxypropyl methylcellulose / hypromellose (ISOPTO TEARS / GONIOVISC) 2.5 % ophthalmic solution Place 1 drop into both eyes 3 (three) times daily as needed for dry eyes.    . vitamin B-12 (CYANOCOBALAMIN) 500 MCG tablet Take 500 mcg by mouth daily.     No current facility-administered medications for this encounter.    Facility-Administered Medications Ordered in Other Encounters  Medication Dose Route Frequency Provider Last Rate Last Dose  . lidocaine (cardiac) 100 mg/5ml (XYLOCAINE) 20 MG/ML injection 2%    Anesthesia Intra-op Jarvela, Joshua R, CRNA   50 mg at 06/13/13 1414    Allergies  Allergen Reactions  . Trandolapril-Verapamil Hcl Er Swelling    Causes lips to swell ("TARKA" is the name brand)    Social History   Socioeconomic History  . Marital status: Married    Spouse name: Not on file  . Number of children: Not on file  . Years of education: Not on file  . Highest education level:  Not on file  Social Needs  . Financial resource strain: Not on file  . Food insecurity - worry: Not on file  . Food insecurity - inability: Not on file  . Transportation needs - medical: Not on file  . Transportation needs - non-medical: Not on file  Occupational History  . Not on file  Tobacco Use  . Smoking status: Never Smoker  . Smokeless tobacco: Never Used  Substance and Sexual Activity  . Alcohol use: No  . Drug use: No  . Sexual activity: Yes    Birth control/protection: Post-menopausal, Surgical  Other Topics Concern  .   Not on file  Social History Narrative  . Not on file    Family History  Problem Relation Age of Onset  . Cancer Father 67       prostate ca  . Arthritis Mother     ROS- All systems are reviewed and negative except as per the HPI above  Physical Exam: Vitals:   05/22/17 1026  BP: 126/72  Weight: 285 lb (129.3 kg)  Height: 5' 2" (1.575 m)   Wt Readings from Last 3 Encounters:  05/22/17 285 lb (129.3 kg)  04/30/17 256 lb (116.1 kg)  02/20/17 276 lb 12.8 oz (125.6 kg)    Labs: Lab Results  Component Value Date   NA 138 05/22/2017   K 3.6 05/22/2017   CL 101 05/22/2017   CO2 27 05/22/2017   GLUCOSE 96 05/22/2017   BUN 16 05/22/2017   CREATININE 1.11 (H) 05/22/2017   CALCIUM 9.1 05/22/2017   No results found for: INR No results found for: CHOL, HDL, LDLCALC, TRIG   GEN- The patient is well appearing, alert and oriented x 3 today.   Head- normocephalic, atraumatic Eyes-  Sclera clear, conjunctiva pink Ears- hearing intact Oropharynx- clear Neck- supple, no JVP Lymph- no cervical lymphadenopathy Lungs- Clear to ausculation bilaterally, normal work of breathing Heart- irregular rate and rhythm, no murmurs, rubs or gallops, PMI not laterally displaced GI- soft, NT, ND, + BS Extremities- no clubbing, cyanosis, or edema MS- no significant deformity or atrophy Skin- no rash or lesion Psych- euthymic mood, full affect Neuro-  strength and sensation are intact  EKG-afib at 113 bpm, qrs int 64 ms, qtc 458 ms Epic records reviewed    Assessment and Plan: 1. Persistent afib, new onset Has been on xarelto without interruption since 12/6 for chads vasc score of 3, reminded not to miss doses Will plan on cardioversion 06/02/17 Risk vrs benefit discussed and pt would like to proceed Bleeding precautions reviewed Continue metoprolol 25 mg 1/2 tab a day Will see f/u in afib clinic one week after cardioversion and will update echo at that time Continue CPAP for OSA Regular exercise and weight loss recommended Bmet/cbc today  2. HTN Stable  F/u in fib clinic one week after cardioversion   Butch Penny C. Breta Demedeiros, [facility] [address] [place], [place] [address] [phone]

## 2017-05-22 NOTE — Progress Notes (Signed)
1 

## 2017-05-22 NOTE — Progress Notes (Signed)
Primary Care Physician: Stephens Shire, MD Referring Physician: [facility] ER f/u   [name] is a 66 y.o. female with a h/o of breast CA s/p R mastectomy not currently undergoing chemo or radiation, HLD, HTN, and a small benign pituitary mass presenting with an episode of lightheadedness and dizziness while eating dinner that lasted 5-10 minutes and then gradually resolved.  She denied any chest pain shortness of breath associated with this episode.  Denies any palpitations or chest pain.  EMS noted that she had an irregularly irregular rhythm.   Patient endorses family stress after her brother died recently. She was started on low dose BB and xarelto 20 mg daily, 12/6, which she has taken without missed doses.  In the afib clinic, 12/28, she feels well, although she continues in afib. She does  not smoke, drink alcohol, or excessive amounts of caffeine. Last echo was in 2013 when she had treatment for Rt breast CA with chemo and radiation. She does have OSA but uses CPAP.  Today, she denies symptoms of palpitations, chest pain, shortness of breath, orthopnea, PND, lower extremity edema, dizziness, presyncope, syncope, or neurologic sequela. The patient is tolerating medications without difficulties and is otherwise without complaint today.   Past Medical History:  Diagnosis Date  . Arthritis   . Asthma   . Blood transfusion    hx of last one in 1987  . Breast cancer ([place])   . Cancer of lower-inner quadrant of female breast ([place]) 07/24/2011   right breast cancer /IDC,stage IIB,er/pr=+,her2=Neg  . Glaucoma   . Heart murmur   . History of chemotherapy    taxotere/cytoxan 6 cycles 09/22/11-01/05/12 day 2 neulasta  . Hyperlipemia   . Hypertension   . Lymphedema    right arm wears a sleeve  . MRSA (methicillin resistant staph aureus) culture positive    08/2011  . Neuromuscular disorder ([place])    peripheral neuropathy hands feet  . OSA (obstructive sleep apnea)    CPAP settings- ?   .  Pituitary abnormality ([place]) 05-25-13   small pituitary growth-appears stable- Dr. Christella Noa follows   Past Surgical History:  Procedure Laterality Date  . ABDOMINAL HYSTERECTOMY  2007   TAH/BSO  . BREAST SURGERY     mastectomy RIGHT  . CARPAL TUNNEL RELEASE Right    2012  . CHOLECYSTECTOMY  1987   open - Dr Lindon Romp  . COLONOSCOPY N/A 06/13/2013   Procedure: COLONOSCOPY;  Surgeon: Juanita Craver, MD;  Location: [facility];  Service: Endoscopy;  Laterality: N/A;  . DILATION AND CURETTAGE OF UTERUS     x3  . EYE SURGERY     laser eye surgery for glaucoma  . MASTECTOMY MODIFIED RADICAL  08/14/11   right , ER/PR +, HER2 -  . port-a-cath insertion     port-a-cath removal 1'14  . PORT-A-CATH REMOVAL  06/22/2012   Procedure: MINOR REMOVAL PORT-A-CATH;  Surgeon: Haywood Lasso, MD;  Location: [facility];  Service: General;  Laterality: Left;  . PORTACATH PLACEMENT  09/11/2011   Procedure: INSERTION PORT-A-CATH;  Surgeon: Joyice Faster. Cornett, MD;  Location: [facility];  Service: General;  Laterality: N/A;  Insert of Port  . TUBAL LIGATION      Current Outpatient Medications  Medication Sig Dispense Refill  . albuterol (PROAIR HFA) 108 (90 Base) MCG/ACT inhaler Inhale 2 puffs into the lungs every 6 (six) hours as needed for wheezing or shortness of breath. For shortness of breath    .  amLODipine (NORVASC) 2.5 MG tablet Take 2.5 mg by mouth daily.    Marland Kitchen anastrozole (ARIMIDEX) 1 MG tablet Take 1 tablet (1 mg total) by mouth daily. 90 tablet 3  . cabergoline (DOSTINEX) 0.5 MG tablet Take 0.5 mg by mouth 2 (two) times a week. Tuesday and Thursday    . Cholecalciferol (VITAMIN D3) 1000 UNITS CAPS Take 1,000 Units by mouth every other day.     . Fluticasone-Salmeterol (ADVAIR DISKUS) 100-50 MCG/DOSE AEPB Inhale 1 puff into the lungs daily.     . furosemide (LASIX) 20 MG tablet Take 20 mg by mouth daily as needed for fluid.     Marland Kitchen GLUCOSAMINE HCL PO Take 1 tablet by mouth daily.    Marland Kitchen  losartan-hydrochlorothiazide (HYZAAR) 100-25 MG tablet Take 1 tablet by mouth daily.     . metoprolol succinate (TOPROL-XL) 25 MG 24 hr tablet Take 0.5 tablets (12.5 mg total) by mouth daily. 45 tablet 2  . Multiple Vitamin (MULITIVITAMIN WITH MINERALS) TABS Take 1 tablet by mouth daily with breakfast.     . potassium chloride SA (K-DUR,KLOR-CON) 20 MEQ tablet Take 1 tablet (20 mEq total) by mouth 2 (two) times daily. (Patient taking differently: Take 20 mEq by mouth daily as needed (with lasix for fluid retention.). ) 30 tablet 0  . rivaroxaban (XARELTO) 20 MG TABS tablet Take 1 tablet (20 mg total) by mouth daily with supper. 90 tablet 2  . SIMBRINZA 1-0.2 % SUSP Place 1 drop into both eyes 3 (three) times daily.     . simvastatin (ZOCOR) 20 MG tablet Take 20 mg by mouth at bedtime.     . TRAVATAN Z 0.004 % SOLN ophthalmic solution Place 1 drop into both eyes every evening.     Marland Kitchen acetaminophen (TYLENOL) 325 MG tablet Take 325-650 mg by mouth every 6 (six) hours as needed (for pain.).    Marland Kitchen hydroxypropyl methylcellulose / hypromellose (ISOPTO TEARS / GONIOVISC) 2.5 % ophthalmic solution Place 1 drop into both eyes 3 (three) times daily as needed for dry eyes.    . vitamin B-12 (CYANOCOBALAMIN) 500 MCG tablet Take 500 mcg by mouth daily.     No current facility-administered medications for this encounter.    Facility-Administered Medications Ordered in Other Encounters  Medication Dose Route Frequency Provider Last Rate Last Dose  . lidocaine (cardiac) 100 mg/64m (XYLOCAINE) 20 MG/ML injection 2%    Anesthesia Intra-op JMontel Clock CRNA   50 mg at 06/13/13 1414    Allergies  Allergen Reactions  . Trandolapril-Verapamil Hcl Er Swelling    Causes lips to swell ([name] is the name brand)    Social History   Socioeconomic History  . Marital status: Married    Spouse name: Not on file  . Number of children: Not on file  . Years of education: Not on file  . Highest education level:  Not on file  Social Needs  . Financial resource strain: Not on file  . Food insecurity - worry: Not on file  . Food insecurity - inability: Not on file  . Transportation needs - medical: Not on file  . Transportation needs - non-medical: Not on file  Occupational History  . Not on file  Tobacco Use  . Smoking status: Never Smoker  . Smokeless tobacco: Never Used  Substance and Sexual Activity  . Alcohol use: No  . Drug use: No  . Sexual activity: Yes    Birth control/protection: Post-menopausal, Surgical  Other Topics Concern  .  Not on file  Social History Narrative  . Not on file    Family History  Problem Relation Age of Onset  . Cancer Father 45       prostate ca  . Arthritis Mother     ROS- All systems are reviewed and negative except as per the HPI above  Physical Exam: Vitals:   05/22/17 1026  BP: 126/72  Weight: 285 lb (129.3 kg)  Height: 5' 2" (1.575 m)   Wt Readings from Last 3 Encounters:  05/22/17 285 lb (129.3 kg)  04/30/17 256 lb (116.1 kg)  02/20/17 276 lb 12.8 oz (125.6 kg)    Labs: Lab Results  Component Value Date   NA 138 05/22/2017   K 3.6 05/22/2017   CL 101 05/22/2017   CO2 27 05/22/2017   GLUCOSE 96 05/22/2017   BUN 16 05/22/2017   CREATININE 1.11 (H) 05/22/2017   CALCIUM 9.1 05/22/2017   No results found for: INR No results found for: CHOL, HDL, LDLCALC, TRIG   GEN- The patient is well appearing, alert and oriented x 3 today.   Head- normocephalic, atraumatic Eyes-  Sclera clear, conjunctiva pink Ears- hearing intact Oropharynx- clear Neck- supple, no JVP Lymph- no cervical lymphadenopathy Lungs- Clear to ausculation bilaterally, normal work of breathing Heart- irregular rate and rhythm, no murmurs, rubs or gallops, PMI not laterally displaced GI- soft, NT, ND, + BS Extremities- no clubbing, cyanosis, or edema MS- no significant deformity or atrophy Skin- no rash or lesion Psych- euthymic mood, full affect Neuro-  strength and sensation are intact  EKG-afib at 113 bpm, qrs int 64 ms, qtc 458 ms Epic records reviewed    Assessment and Plan: 1. Persistent afib, new onset Has been on xarelto without interruption since 12/6 for chads vasc score of 3, reminded not to miss doses Will plan on cardioversion 06/02/17 Risk vrs benefit discussed and pt would like to proceed Bleeding precautions reviewed Continue metoprolol 25 mg 1/2 tab a day Will see f/u in afib clinic one week after cardioversion and will update echo at that time Continue CPAP for OSA Regular exercise and weight loss recommended Bmet/cbc today  2. HTN Stable  F/u in fib clinic one week after cardioversion   Butch Penny C. Dewey Neukam, [facility] [address] [place], [place] [address] [phone]

## 2017-05-22 NOTE — Patient Instructions (Signed)
Cardioversion scheduled for Tuesday, January 8th  - Arrive at the [facility] and go to admitting at 11:30AM  -Do not eat or drink anything after midnight the night prior to your procedure.  - Take all your medication with a sip of water prior to arrival.  - You will not be able to drive home after your procedure.

## 2017-06-02 ENCOUNTER — Encounter (HOSPITAL_COMMUNITY)
Admission: RE | Disposition: A | Discharge status: Home / Self Care | Payer: Self-pay | Source: Ambulatory Visit | Attending: Cardiology

## 2017-06-02 ENCOUNTER — Encounter (HOSPITAL_COMMUNITY): Payer: Self-pay | Admitting: Certified Registered Nurse Anesthetist

## 2017-06-02 ENCOUNTER — Ambulatory Visit (HOSPITAL_COMMUNITY)
Admission: RE | Admit: 2017-06-02 | Discharge: 2017-06-02 | Disposition: A | Discharge status: Home / Self Care | Payer: Medicare Other | Source: Ambulatory Visit | Attending: Cardiology | Admitting: Cardiology

## 2017-06-02 ENCOUNTER — Ambulatory Visit (HOSPITAL_COMMUNITY): Payer: Medicare Other | Admitting: Anesthesiology

## 2017-06-02 ENCOUNTER — Other Ambulatory Visit: Payer: Self-pay

## 2017-06-02 DIAGNOSIS — E785 Hyperlipidemia, unspecified: Secondary | ICD-10-CM | POA: Insufficient documentation

## 2017-06-02 DIAGNOSIS — H409 Unspecified glaucoma: Secondary | ICD-10-CM | POA: Diagnosis not present

## 2017-06-02 DIAGNOSIS — I481 Persistent atrial fibrillation: Secondary | ICD-10-CM | POA: Diagnosis not present

## 2017-06-02 DIAGNOSIS — H04123 Dry eye syndrome of bilateral lacrimal glands: Secondary | ICD-10-CM | POA: Diagnosis not present

## 2017-06-02 DIAGNOSIS — Z6841 Body Mass Index (BMI) 40.0 and over, adult: Secondary | ICD-10-CM | POA: Diagnosis not present

## 2017-06-02 DIAGNOSIS — Z9221 Personal history of antineoplastic chemotherapy: Secondary | ICD-10-CM | POA: Insufficient documentation

## 2017-06-02 DIAGNOSIS — Z9989 Dependence on other enabling machines and devices: Secondary | ICD-10-CM | POA: Insufficient documentation

## 2017-06-02 DIAGNOSIS — Z9011 Acquired absence of right breast and nipple: Secondary | ICD-10-CM | POA: Diagnosis not present

## 2017-06-02 DIAGNOSIS — G629 Polyneuropathy, unspecified: Secondary | ICD-10-CM | POA: Diagnosis not present

## 2017-06-02 DIAGNOSIS — Z79899 Other long term (current) drug therapy: Secondary | ICD-10-CM | POA: Diagnosis not present

## 2017-06-02 DIAGNOSIS — M1990 Unspecified osteoarthritis, unspecified site: Secondary | ICD-10-CM | POA: Diagnosis not present

## 2017-06-02 DIAGNOSIS — G4733 Obstructive sleep apnea (adult) (pediatric): Secondary | ICD-10-CM | POA: Insufficient documentation

## 2017-06-02 DIAGNOSIS — Z8614 Personal history of Methicillin resistant Staphylococcus aureus infection: Secondary | ICD-10-CM | POA: Insufficient documentation

## 2017-06-02 DIAGNOSIS — Z853 Personal history of malignant neoplasm of breast: Secondary | ICD-10-CM | POA: Insufficient documentation

## 2017-06-02 DIAGNOSIS — I482 Chronic atrial fibrillation, unspecified: Secondary | ICD-10-CM

## 2017-06-02 DIAGNOSIS — Z888 Allergy status to other drugs, medicaments and biological substances status: Secondary | ICD-10-CM | POA: Insufficient documentation

## 2017-06-02 DIAGNOSIS — Z7901 Long term (current) use of anticoagulants: Secondary | ICD-10-CM | POA: Diagnosis not present

## 2017-06-02 DIAGNOSIS — J45909 Unspecified asthma, uncomplicated: Secondary | ICD-10-CM | POA: Diagnosis not present

## 2017-06-02 DIAGNOSIS — R011 Cardiac murmur, unspecified: Secondary | ICD-10-CM | POA: Insufficient documentation

## 2017-06-02 DIAGNOSIS — I10 Essential (primary) hypertension: Secondary | ICD-10-CM | POA: Diagnosis not present

## 2017-06-02 HISTORY — PX: CARDIOVERSION: SHX1299

## 2017-06-02 LAB — POCT I-STAT, CHEM 8
BUN: 17 mg/dL (ref 6–20)
Calcium, Ion: 1.16 mmol/L (ref 1.15–1.40)
Chloride: 103 mmol/L (ref 101–111)
Creatinine, Ser: 0.9 mg/dL (ref 0.44–1.00)
Glucose, Bld: 101 mg/dL — ABNORMAL HIGH (ref 65–99)
HCT: 38 % (ref 36.0–46.0)
Hemoglobin: 12.9 g/dL (ref 12.0–15.0)
Potassium: 3.6 mmol/L (ref 3.5–5.1)
Sodium: 141 mmol/L (ref 135–145)
TCO2: 29 mmol/L (ref 22–32)

## 2017-06-02 SURGERY — CARDIOVERSION
Anesthesia: General

## 2017-06-02 MED ORDER — SODIUM CHLORIDE 0.9 % IV SOLN
INTRAVENOUS | Status: DC | PRN
  Administered 2017-06-02: 13:00:00 via INTRAVENOUS

## 2017-06-02 MED ORDER — PROPOFOL 10 MG/ML IV BOLUS
INTRAVENOUS | Status: DC | PRN
  Administered 2017-06-02: 60 mg via INTRAVENOUS

## 2017-06-02 NOTE — CV Procedure (Signed)
    Cardioversion Note  [name] [id number] 1950/10/31  Procedure: DC Cardioversion Indications: atrial fibrillation  Procedure Details Consent: Obtained Time Out: Verified patient identification, verified procedure, site/side was marked, verified correct patient position, special equipment/implants available, Radiology Safety Procedures followed,  medications/allergies/relevent history reviewed, required imaging and test results available.  Performed  The patient has been on adequate anticoagulation.  The patient received IV propofol and Lidocain for sedation.  Synchronous cardioversion was performed at 120 joules.  The cardioversion was successful.   Complications: No apparent complications Patient did tolerate procedure well.   [name], MD, [facility] 06/02/2017, 2:13 PM

## 2017-06-02 NOTE — Anesthesia Preprocedure Evaluation (Addendum)
Anesthesia Evaluation  Patient identified by MRN, date of birth, ID band Patient awake    Reviewed: Allergy & Precautions, H&P , NPO status , Patient's Chart, lab work & pertinent test results  Airway Mallampati: III  TM Distance: >3 FB Neck ROM: Full    Dental no notable dental hx. (+) Teeth Intact, Dental Advisory Given   Pulmonary asthma , sleep apnea and Continuous Positive Airway Pressure Ventilation ,    Pulmonary exam normal breath sounds clear to auscultation       Cardiovascular hypertension, Pt. on medications and Pt. on home beta blockers + dysrhythmias Atrial Fibrillation  Rhythm:Irregular Rate:Tachycardia     Neuro/Psych negative neurological ROS  negative psych ROS   GI/Hepatic negative GI ROS, Neg liver ROS,   Endo/Other  Morbid obesity  Renal/GU negative Renal ROS  negative genitourinary   Musculoskeletal  (+) Arthritis , Osteoarthritis,    Abdominal   Peds  Hematology negative hematology ROS (+)   Anesthesia Other Findings   Reproductive/Obstetrics negative OB ROS                            Anesthesia Physical Anesthesia Plan  ASA: III  Anesthesia Plan: General   Post-op Pain Management:    Induction: Intravenous  PONV Risk Score and Plan: 3 and Treatment may vary due to age or medical condition  Airway Management Planned: Mask  Additional Equipment:   Intra-op Plan:   Post-operative Plan:   Informed Consent: I have reviewed the patients History and Physical, chart, labs and discussed the procedure including the risks, benefits and alternatives for the proposed anesthesia with the patient or authorized representative who has indicated his/her understanding and acceptance.   Dental advisory given  Plan Discussed with: CRNA  Anesthesia Plan Comments:         Anesthesia Quick Evaluation

## 2017-06-02 NOTE — Anesthesia Procedure Notes (Signed)
Procedure Name: General with mask airway Date/Time: 06/02/2017 1:35 PM Performed by: Inda Coke, CRNA Pre-anesthesia Checklist: Patient identified, Emergency Drugs available, Suction available, Patient being monitored and Timeout performed Patient Re-evaluated:Patient Re-evaluated prior to induction Oxygen Delivery Method: Ambu bag Preoxygenation: Pre-oxygenation with 100% oxygen Induction Type: IV induction Ventilation: Mask ventilation without difficulty

## 2017-06-02 NOTE — Interval H&P Note (Signed)
History and Physical Interval Note:  06/02/2017 1:00 PM  [name]  has presented today for surgery, with the diagnosis of A-FIB  The various methods of treatment have been discussed with the patient and family. After consideration of risks, benefits and other options for treatment, the patient has consented to  Procedure(s): CARDIOVERSION (N/A) as a surgical intervention .  The patient's history has been reviewed, patient examined, no change in status, stable for surgery.  I have reviewed the patient's chart and labs.  Questions were answered to the patient's satisfaction.     [name]

## 2017-06-02 NOTE — Transfer of Care (Signed)
Immediate Anesthesia Transfer of Care Note  Patient: [name]  Procedure(s) Performed: CARDIOVERSION (N/A )  Patient Location: PACU and Endoscopy Unit  Anesthesia Type:General  Level of Consciousness: awake and patient cooperative  Airway & Oxygen Therapy: Patient Spontanous Breathing  Post-op Assessment: Report given to RN and Post -op Vital signs reviewed and stable  Post vital signs: Reviewed and stable  Last Vitals:  Vitals:   06/02/17 1150 06/02/17 1154  BP: (!) 144/94   Pulse: 100 78  Resp: (!) 21 (!) 25  SpO2: 100% 100%    Last Pain: There were no vitals filed for this visit.       Complications: No apparent anesthesia complications

## 2017-06-02 NOTE — Discharge Instructions (Signed)
Electrical Cardioversion, Care After °This sheet gives you information about how to care for yourself after your procedure. Your health care provider may also give you more specific instructions. If you have problems or questions, contact your health care provider. °What can I expect after the procedure? °After the procedure, it is common to have: °· Some redness on the skin where the shocks were given. ° °Follow these instructions at home: °· Do not drive for 24 hours if you were given a medicine to help you relax (sedative). °· Take over-the-counter and prescription medicines only as told by your health care provider. °· Ask your health care provider how to check your pulse. Check it often. °· Rest for 48 hours after the procedure or as told by your health care provider. °· Avoid or limit your caffeine use as told by your health care provider. °Contact a health care provider if: °· You feel like your heart is beating too quickly or your pulse is not regular. °· You have a serious muscle cramp that does not go away. °Get help right away if: °· You have discomfort in your chest. °· You are dizzy or you feel faint. °· You have trouble breathing or you are short of breath. °· Your speech is slurred. °· You have trouble moving an arm or leg on one side of your body. °· Your fingers or toes turn cold or blue. °This information is not intended to replace advice given to you by your health care provider. Make sure you discuss any questions you have with your health care provider. °Document Released: 03/02/2013 Document Revised: 12/14/2015 Document Reviewed: 11/16/2015 °Elsevier Interactive Patient Education © 2018 [employer] ° °

## 2017-06-03 NOTE — Anesthesia Postprocedure Evaluation (Signed)
Anesthesia Post Note  Patient: [name]  Procedure(s) Performed: CARDIOVERSION (N/A )     Patient location during evaluation: PACU Anesthesia Type: General Level of consciousness: awake and alert Pain management: pain level controlled Vital Signs Assessment: post-procedure vital signs reviewed and stable Respiratory status: spontaneous breathing, nonlabored ventilation, respiratory function stable and patient connected to nasal cannula oxygen Cardiovascular status: blood pressure returned to baseline and stable Postop Assessment: no apparent nausea or vomiting Anesthetic complications: no    Last Vitals:  Vitals:   06/02/17 1420 06/02/17 1435  BP: (!) 169/111 (!) 167/95  Pulse: 74   Resp: 12   Temp:    SpO2: 100%     Last Pain:  Vitals:   06/02/17 1400  TempSrc: Oral                 Amzie Sillas S

## 2017-06-04 ENCOUNTER — Encounter (HOSPITAL_COMMUNITY): Payer: Self-pay | Admitting: Cardiology

## 2017-06-09 ENCOUNTER — Other Ambulatory Visit: Payer: Self-pay

## 2017-06-09 ENCOUNTER — Ambulatory Visit (HOSPITAL_BASED_OUTPATIENT_CLINIC_OR_DEPARTMENT_OTHER)
Admission: RE | Admit: 2017-06-09 | Discharge: 2017-06-09 | Disposition: A | Discharge status: Home / Self Care | Payer: Medicare Other | Source: Ambulatory Visit | Attending: Nurse Practitioner | Admitting: Nurse Practitioner

## 2017-06-09 ENCOUNTER — Encounter (HOSPITAL_COMMUNITY): Payer: Self-pay | Admitting: Nurse Practitioner

## 2017-06-09 ENCOUNTER — Ambulatory Visit (HOSPITAL_COMMUNITY)
Admission: RE | Admit: 2017-06-09 | Discharge: 2017-06-09 | Disposition: A | Discharge status: Home / Self Care | Payer: Medicare Other | Source: Ambulatory Visit | Attending: Nurse Practitioner | Admitting: Nurse Practitioner

## 2017-06-09 VITALS — BP 122/68 | HR 77 | Ht 62.0 in | Wt 278.0 lb

## 2017-06-09 DIAGNOSIS — Z8614 Personal history of Methicillin resistant Staphylococcus aureus infection: Secondary | ICD-10-CM | POA: Insufficient documentation

## 2017-06-09 DIAGNOSIS — Z7901 Long term (current) use of anticoagulants: Secondary | ICD-10-CM | POA: Diagnosis not present

## 2017-06-09 DIAGNOSIS — H409 Unspecified glaucoma: Secondary | ICD-10-CM | POA: Diagnosis not present

## 2017-06-09 DIAGNOSIS — Z90722 Acquired absence of ovaries, bilateral: Secondary | ICD-10-CM | POA: Diagnosis not present

## 2017-06-09 DIAGNOSIS — Z853 Personal history of malignant neoplasm of breast: Secondary | ICD-10-CM | POA: Insufficient documentation

## 2017-06-09 DIAGNOSIS — Z9221 Personal history of antineoplastic chemotherapy: Secondary | ICD-10-CM | POA: Diagnosis not present

## 2017-06-09 DIAGNOSIS — I420 Dilated cardiomyopathy: Secondary | ICD-10-CM | POA: Diagnosis not present

## 2017-06-09 DIAGNOSIS — J45909 Unspecified asthma, uncomplicated: Secondary | ICD-10-CM | POA: Diagnosis not present

## 2017-06-09 DIAGNOSIS — Z9889 Other specified postprocedural states: Secondary | ICD-10-CM | POA: Insufficient documentation

## 2017-06-09 DIAGNOSIS — Z8261 Family history of arthritis: Secondary | ICD-10-CM | POA: Diagnosis not present

## 2017-06-09 DIAGNOSIS — I890 Lymphedema, not elsewhere classified: Secondary | ICD-10-CM | POA: Diagnosis not present

## 2017-06-09 DIAGNOSIS — I061 Rheumatic aortic insufficiency: Secondary | ICD-10-CM | POA: Diagnosis not present

## 2017-06-09 DIAGNOSIS — I4819 Other persistent atrial fibrillation: Secondary | ICD-10-CM

## 2017-06-09 DIAGNOSIS — G4733 Obstructive sleep apnea (adult) (pediatric): Secondary | ICD-10-CM | POA: Diagnosis not present

## 2017-06-09 DIAGNOSIS — Z90710 Acquired absence of both cervix and uterus: Secondary | ICD-10-CM | POA: Insufficient documentation

## 2017-06-09 DIAGNOSIS — Z8042 Family history of malignant neoplasm of prostate: Secondary | ICD-10-CM | POA: Diagnosis not present

## 2017-06-09 DIAGNOSIS — E785 Hyperlipidemia, unspecified: Secondary | ICD-10-CM | POA: Insufficient documentation

## 2017-06-09 DIAGNOSIS — Z79899 Other long term (current) drug therapy: Secondary | ICD-10-CM | POA: Insufficient documentation

## 2017-06-09 DIAGNOSIS — I481 Persistent atrial fibrillation: Secondary | ICD-10-CM | POA: Insufficient documentation

## 2017-06-09 DIAGNOSIS — Z9011 Acquired absence of right breast and nipple: Secondary | ICD-10-CM | POA: Diagnosis not present

## 2017-06-09 DIAGNOSIS — R011 Cardiac murmur, unspecified: Secondary | ICD-10-CM | POA: Diagnosis not present

## 2017-06-09 DIAGNOSIS — Z888 Allergy status to other drugs, medicaments and biological substances status: Secondary | ICD-10-CM | POA: Insufficient documentation

## 2017-06-09 DIAGNOSIS — I10 Essential (primary) hypertension: Secondary | ICD-10-CM | POA: Insufficient documentation

## 2017-06-09 DIAGNOSIS — G629 Polyneuropathy, unspecified: Secondary | ICD-10-CM | POA: Insufficient documentation

## 2017-06-09 DIAGNOSIS — Z9049 Acquired absence of other specified parts of digestive tract: Secondary | ICD-10-CM | POA: Diagnosis not present

## 2017-06-09 LAB — CBC
HCT: 38.5 % (ref 36.0–46.0)
Hemoglobin: 12.3 g/dL (ref 12.0–15.0)
MCH: 29.3 pg (ref 26.0–34.0)
MCHC: 31.9 g/dL (ref 30.0–36.0)
MCV: 91.7 fL (ref 78.0–100.0)
PLATELETS: 185 10*3/uL (ref 150–400)
RBC: 4.2 MIL/uL (ref 3.87–5.11)
RDW: 13.9 % (ref 11.5–15.5)
WBC: 5.5 10*3/uL (ref 4.0–10.5)

## 2017-06-09 NOTE — Progress Notes (Signed)
Primary Care Physician: Stephens Shire, MD Referring Physician: [facility] ER f/u   ADDI PAK is a 67 y.o. female with a h/o of breast CA s/p R mastectomy not currently undergoing chemo or radiation, HLD, HTN, and a small benign pituitary mass presenting with an episode of lightheadedness and dizziness while eating dinner, 04/30/17, that lasted 5-10 minutes and then gradually resolved.  She denied any chest pain shortness of breath associated with this episode.  Denies any palpitations or chest pain.  EMS noted that she had an irregularly irregular rhythm.   Patient endorses family stress after her brother died recently. She was started on low dose BB and xarelto 20 mg daily, 12/6, which she has taken without missed doses.  In the afib clinic, 12/28, she feels well, although she continues in afib. She does  not smoke, drink alcohol, or excessive amounts of caffeine. Last echo was in 2013 when she had treatment for Rt breast CA with chemo and radiation. She does have OSA anduses CPAP.  F/u cardioversion which was done successfully 06/02/17. She continues in  SR. She feels improved.  Echo updated today and results pending.   Today, she denies symptoms of palpitations, chest pain, shortness of breath, orthopnea, PND, lower extremity edema, dizziness, presyncope, syncope, or neurologic sequela. The patient is tolerating medications without difficulties and is otherwise without complaint today.   Past Medical History:  Diagnosis Date  . Arthritis   . Asthma   . Blood transfusion    hx of last one in 1987  . Breast cancer ([place])   . Cancer of lower-inner quadrant of female breast ([place]) 07/24/2011   right breast cancer /IDC,stage IIB,er/pr=+,her2=Neg  . Glaucoma   . Heart murmur   . History of chemotherapy    taxotere/cytoxan 6 cycles 09/22/11-01/05/12 day 2 neulasta  . Hyperlipemia   . Hypertension   . Lymphedema    right arm wears a sleeve  . MRSA (methicillin resistant staph aureus) culture  positive    08/2011  . Neuromuscular disorder ([place])    peripheral neuropathy hands feet  . OSA (obstructive sleep apnea)    CPAP settings- ?   . Pituitary abnormality ([place]) 05-25-13   small pituitary growth-appears stable- Dr. Christella Noa follows   Past Surgical History:  Procedure Laterality Date  . ABDOMINAL HYSTERECTOMY  2007   TAH/BSO  . BREAST SURGERY     mastectomy RIGHT  . CARDIOVERSION N/A 06/02/2017   Procedure: CARDIOVERSION;  Surgeon: Dorothy Spark, MD;  Location: [place];  Service: Cardiovascular;  Laterality: N/A;  . CARPAL TUNNEL RELEASE Right    2012  . CHOLECYSTECTOMY  1987   open - Dr Lindon Romp  . COLONOSCOPY N/A 06/13/2013   Procedure: COLONOSCOPY;  Surgeon: Juanita Craver, MD;  [facility];  Service: Endoscopy;  Laterality: N/A;  . DILATION AND CURETTAGE OF UTERUS     x3  . EYE SURGERY     laser eye surgery for glaucoma  . MASTECTOMY MODIFIED RADICAL  08/14/11   right , ER/PR +, HER2 -  . port-a-cath insertion     port-a-cath removal 1'14  . PORT-A-CATH REMOVAL  06/22/2012   Procedure: MINOR REMOVAL PORT-A-CATH;  Surgeon: Haywood Lasso, MD;  Location: [place];  Service: General;  Laterality: Left;  . PORTACATH PLACEMENT  09/11/2011   Procedure: INSERTION PORT-A-CATH;  Surgeon: Joyice Faster. Cornett, MD;  Location: [facility];  Service: General;  Laterality: N/A;  Insert of Port  . TUBAL LIGATION  Current Outpatient Medications  Medication Sig Dispense Refill  . acetaminophen (TYLENOL) 325 MG tablet Take 325-650 mg by mouth every 6 (six) hours as needed (for pain.).    Marland Kitchen albuterol (PROAIR HFA) 108 (90 Base) MCG/ACT inhaler Inhale 2 puffs into the lungs every 6 (six) hours as needed for wheezing or shortness of breath. For shortness of breath    . amLODipine (NORVASC) 2.5 MG tablet Take 2.5 mg by mouth daily.    Marland Kitchen anastrozole (ARIMIDEX) 1 MG tablet Take 1 tablet (1 mg total) by mouth daily. 90 tablet 3  . cabergoline (DOSTINEX) 0.5 MG  tablet Take 0.5 mg by mouth 2 (two) times a week. Tuesday and Thursday    . Cholecalciferol (VITAMIN D3) 1000 UNITS CAPS Take 1,000 Units by mouth every other day.     . Fluticasone-Salmeterol (ADVAIR DISKUS) 100-50 MCG/DOSE AEPB Inhale 1 puff into the lungs daily.     . furosemide (LASIX) 20 MG tablet Take 20 mg by mouth daily as needed for fluid.     Marland Kitchen GLUCOSAMINE HCL PO Take 1 tablet by mouth daily.    . hydroxypropyl methylcellulose / hypromellose (ISOPTO TEARS / GONIOVISC) 2.5 % ophthalmic solution Place 1 drop into both eyes 3 (three) times daily as needed for dry eyes.    Marland Kitchen losartan-hydrochlorothiazide (HYZAAR) 100-25 MG tablet Take 1 tablet by mouth daily.     . metoprolol succinate (TOPROL-XL) 25 MG 24 hr tablet Take 0.5 tablets (12.5 mg total) by mouth daily. 45 tablet 2  . Multiple Vitamin (MULITIVITAMIN WITH MINERALS) TABS Take 1 tablet by mouth daily with breakfast.     . potassium chloride SA (K-DUR,KLOR-CON) 20 MEQ tablet Take 1 tablet (20 mEq total) by mouth 2 (two) times daily. (Patient taking differently: Take 20 mEq by mouth daily as needed (with lasix for fluid retention.). ) 30 tablet 0  . rivaroxaban (XARELTO) 20 MG TABS tablet Take 1 tablet (20 mg total) by mouth daily with supper. 90 tablet 2  . SIMBRINZA 1-0.2 % SUSP Place 1 drop into both eyes 3 (three) times daily.     . simvastatin (ZOCOR) 20 MG tablet Take 20 mg by mouth at bedtime.     . TRAVATAN Z 0.004 % SOLN ophthalmic solution Place 1 drop into both eyes every evening.     . vitamin B-12 (CYANOCOBALAMIN) 500 MCG tablet Take 500 mcg by mouth daily.     No current facility-administered medications for this encounter.    Facility-Administered Medications Ordered in Other Encounters  Medication Dose Route Frequency Provider Last Rate Last Dose  . lidocaine (cardiac) 100 mg/19m (XYLOCAINE) 20 MG/ML injection 2%    Anesthesia Intra-op JMontel Clock CRNA   60 mg at 06/02/17 1343    Allergies  Allergen Reactions   . Trandolapril-Verapamil Hcl Er Swelling    Causes lips to swell ([name] is the name brand)    Social History   Socioeconomic History  . Marital status: Married    Spouse name: Not on file  . Number of children: Not on file  . Years of education: Not on file  . Highest education level: Not on file  Social Needs  . Financial resource strain: Not on file  . Food insecurity - worry: Not on file  . Food insecurity - inability: Not on file  . Transportation needs - medical: Not on file  . Transportation needs - non-medical: Not on file  Occupational History  . Not on file  Tobacco Use  .  Smoking status: Never Smoker  . Smokeless tobacco: Never Used  Substance and Sexual Activity  . Alcohol use: No  . Drug use: No  . Sexual activity: Yes    Birth control/protection: Post-menopausal, Surgical  Other Topics Concern  . Not on file  Social History Narrative  . Not on file    Family History  Problem Relation Age of Onset  . Cancer Father 45       prostate ca  . Arthritis Mother     ROS- All systems are reviewed and negative except as per the HPI above  Physical Exam: Vitals:   06/09/17 0946  BP: 122/68  Pulse: 77  Weight: 278 lb (126.1 kg)  Height: 5' 2"  (1.575 m)   Wt Readings from Last 3 Encounters:  06/09/17 278 lb (126.1 kg)  06/02/17 264 lb (119.7 kg)  05/22/17 285 lb (129.3 kg)    Labs: Lab Results  Component Value Date   NA 141 06/02/2017   K 3.6 06/02/2017   CL 103 06/02/2017   CO2 27 05/22/2017   GLUCOSE 101 (H) 06/02/2017   BUN 17 06/02/2017   CREATININE 0.90 06/02/2017   CALCIUM 9.1 05/22/2017   No results found for: INR No results found for: CHOL, HDL, LDLCALC, TRIG   GEN- The patient is well appearing, alert and oriented x 3 today.   Head- normocephalic, atraumatic Eyes-  Sclera clear, conjunctiva pink Ears- hearing intact Oropharynx- clear Neck- supple, no JVP Lymph- no cervical lymphadenopathy Lungs- Clear to ausculation  bilaterally, normal work of breathing Heart- regular rate and rhythm, no murmurs, rubs or gallops, PMI not laterally displaced GI- soft, NT, ND, + BS Extremities- no clubbing, cyanosis, or edema MS- no significant deformity or atrophy Skin- no rash or lesion Psych- euthymic mood, full affect Neuro- strength and sensation are intact  EKG- NSR at 77 bpm, pr int 176 ms, qrs int 68 ms, qtc 439 ms Epic records reviewed    Assessment and Plan: 1. Persistent afib, new onset Possibly 2/2 death of brother Has been on xarelto without interruption since 12/6 for chads vasc score of 3, reminded not to miss doses, will continue on drug, reminded not to stop drug after cardioversion Continue metoprolol 25 mg 1/2 tab a day Echo results pending Continue CPAP for OSA Regular exercise and weight loss recommended CBC repeated today shows normalized  CBC  2. HTN Stable  Will be referred to general cardiology for f/u   Butch Penny C. Vanette Noguchi, [facility] [address] [place], [place] [address] [phone]

## 2017-06-09 NOTE — Progress Notes (Signed)
  Echocardiogram 2D Echocardiogram has been performed.  [name] T [name] 06/09/2017, 9:45 AM

## 2017-06-11 ENCOUNTER — Encounter (HOSPITAL_COMMUNITY): Payer: Self-pay | Admitting: *Deleted

## 2017-07-10 ENCOUNTER — Other Ambulatory Visit: Payer: Self-pay | Admitting: Obstetrics and Gynecology

## 2017-07-10 DIAGNOSIS — Z1231 Encounter for screening mammogram for malignant neoplasm of breast: Secondary | ICD-10-CM

## 2017-08-18 ENCOUNTER — Ambulatory Visit
Admission: RE | Admit: 2017-08-18 | Discharge: 2017-08-18 | Disposition: A | Discharge status: Home / Self Care | Payer: Medicare Other | Source: Ambulatory Visit | Attending: Obstetrics and Gynecology | Admitting: Obstetrics and Gynecology

## 2017-08-18 DIAGNOSIS — Z1231 Encounter for screening mammogram for malignant neoplasm of breast: Secondary | ICD-10-CM

## 2017-09-02 ENCOUNTER — Encounter: Payer: Self-pay | Admitting: Interventional Cardiology

## 2017-09-08 ENCOUNTER — Ambulatory Visit (INDEPENDENT_AMBULATORY_CARE_PROVIDER_SITE_OTHER): Payer: Medicare Other | Admitting: Interventional Cardiology

## 2017-09-08 ENCOUNTER — Encounter: Payer: Self-pay | Admitting: Interventional Cardiology

## 2017-09-08 VITALS — BP 120/76 | HR 111 | Ht 62.0 in | Wt 279.1 lb

## 2017-09-08 DIAGNOSIS — I119 Hypertensive heart disease without heart failure: Secondary | ICD-10-CM | POA: Diagnosis not present

## 2017-09-08 DIAGNOSIS — I481 Persistent atrial fibrillation: Secondary | ICD-10-CM | POA: Diagnosis not present

## 2017-09-08 DIAGNOSIS — I4819 Other persistent atrial fibrillation: Secondary | ICD-10-CM

## 2017-09-08 DIAGNOSIS — Z7901 Long term (current) use of anticoagulants: Secondary | ICD-10-CM | POA: Diagnosis not present

## 2017-09-08 MED ORDER — METOPROLOL SUCCINATE ER 50 MG PO TB24: 50.0000 mg | ORAL_TABLET | Freq: Every day | ORAL | 3 refills | Status: DC

## 2017-09-08 NOTE — Patient Instructions (Addendum)
Medication Instructions:  Your physician has recommended you make the following change in your medication:   INCREASE: metoprolol succinate (Toprol-XL)   You may take 1 tablet (25 mg total) until you receive your new prescription then,  TAKE metoprolol succinate (Toprol-XL) 50 mg tablet: Take 1 tablet daily  Labwork: None ordered  Testing/Procedures: None ordered  Follow-Up: Your physician recommends that you schedule a follow-up appointment in: 2 weeks in the Afib Clinic- 09/23/17 at 9:30 AM  Any Other Special Instructions Will Be Listed Below (If Applicable).     If you need a refill on your cardiac medications before your next appointment, please call your pharmacy.

## 2017-09-08 NOTE — Progress Notes (Signed)
Cardiology Office Note   Date:  09/08/2017   ID:  [name], DOB Nov 02, 1950, MRN [id number]  PCP:  Stephens Shire, MD    No chief complaint on file.  AFib  Wt Readings from Last 3 Encounters:  09/08/17 279 lb 1.9 oz (126.6 kg)  06/09/17 278 lb (126.1 kg)  06/02/17 264 lb (119.7 kg)       History of Present Illness: [name] is a 67 y.o. female   Who was seen in the AFib clinic.  Per Roderic Palau, she has "a h/o of breast CA s/p R mastectomy not currently undergoing chemo or radiation, HLD, HTN, and a small benign pituitary mass presenting with an episode of lightheadedness and dizzinesswhile eating dinner, 04/30/17, that lasted 5-10 minutes and then gradually resolved. She denied any chest pain shortness of breath associated with this episode. Denies any palpitations or chest pain. EMS noted that she had an irregularly irregular rhythm.  Patient endorses family stress after her brother died recently. She was started on low dose BB and xarelto 20 mg daily, 12/6, which she has taken without missed doses.  In the afib clinic, 12/28, she feels well, although she continues in afib. She does  not smoke, drink alcohol, or excessive amounts of caffeine. Last echo was in 2013 when she had treatment for Rt breast CA with chemo and radiation. She does have OSA anduses CPAP.  F/u cardioversion which was done successfully 06/02/17.  She was in NSR at the AFib clinic in 1/19.  She is here to establish with general cardiology.  Denies : Chest pain. Dizziness. Leg edema. Nitroglycerin use. Orthopnea. Palpitations. Paroxysmal nocturnal dyspnea. Shortness of breath. Syncope.   She does water aerobics 3x/week, 45 minutes at a time.  She does well with the exercise.  She is walking some as well.  She has not noticed being out of rhythm.  No side effects from the meds.      Past Medical History:  Diagnosis Date  . Arthritis   . Asthma   . Blood transfusion    hx of last  one in 1987  . Breast cancer ([place])   . Cancer of lower-inner quadrant of female breast ([place]) 07/24/2011   right breast cancer /IDC,stage IIB,er/pr=+,her2=Neg  . Glaucoma   . Heart murmur   . History of chemotherapy    taxotere/cytoxan 6 cycles 09/22/11-01/05/12 day 2 neulasta  . Hyperlipemia   . Hypertension   . Lymphedema    right arm wears a sleeve  . MRSA (methicillin resistant staph aureus) culture positive    08/2011  . Neuromuscular disorder ([place])    peripheral neuropathy hands feet  . OSA (obstructive sleep apnea)    CPAP settings- ?   . Pituitary abnormality ([place]) 05-25-13   small pituitary growth-appears stable- Dr. Christella Noa follows    Past Surgical History:  Procedure Laterality Date  . ABDOMINAL HYSTERECTOMY  2007   TAH/BSO  . BREAST SURGERY     mastectomy RIGHT  . CARDIOVERSION N/A 06/02/2017   Procedure: CARDIOVERSION;  Surgeon: Dorothy Spark, MD;  Location: [place];  Service: Cardiovascular;  Laterality: N/A;  . CARPAL TUNNEL RELEASE Right    2012  . CHOLECYSTECTOMY  1987   open - Dr Lindon Romp  . COLONOSCOPY N/A 06/13/2013   Procedure: COLONOSCOPY;  Surgeon: Juanita Craver, MD;  [facility];  Service: Endoscopy;  Laterality: N/A;  . DILATION AND CURETTAGE OF UTERUS     x3  .  EYE SURGERY     laser eye surgery for glaucoma  . MASTECTOMY MODIFIED RADICAL  08/14/11   right , ER/PR +, HER2 -  . port-a-cath insertion     port-a-cath removal 1'14  . PORT-A-CATH REMOVAL  06/22/2012   Procedure: MINOR REMOVAL PORT-A-CATH;  Surgeon: Haywood Lasso, MD;  Location: [place];  Service: General;  Laterality: Left;  . PORTACATH PLACEMENT  09/11/2011   Procedure: INSERTION PORT-A-CATH;  Surgeon: Joyice Faster. Cornett, MD;  [facility];  Service: General;  Laterality: N/A;  Insert of Port  . TUBAL LIGATION       Current Outpatient Medications  Medication Sig Dispense Refill  . acetaminophen (TYLENOL) 325 MG tablet Take 325-650 mg by mouth  every 6 (six) hours as needed (for pain.).    Marland Kitchen albuterol (PROAIR HFA) 108 (90 Base) MCG/ACT inhaler Inhale 2 puffs into the lungs every 6 (six) hours as needed for wheezing or shortness of breath. For shortness of breath    . amLODipine (NORVASC) 2.5 MG tablet Take 2.5 mg by mouth daily.    Marland Kitchen anastrozole (ARIMIDEX) 1 MG tablet Take 1 tablet (1 mg total) by mouth daily. 90 tablet 3  . cabergoline (DOSTINEX) 0.5 MG tablet Take 0.5 mg by mouth 2 (two) times a week. Tuesday and Thursday    . Cholecalciferol (VITAMIN D3) 1000 UNITS CAPS Take 1,000 Units by mouth every other day.     . Fluticasone-Salmeterol (ADVAIR DISKUS) 100-50 MCG/DOSE AEPB Inhale 1 puff into the lungs daily.     . furosemide (LASIX) 20 MG tablet Take 20 mg by mouth daily as needed for fluid.     Marland Kitchen GLUCOSAMINE HCL PO Take 1 tablet by mouth daily.    . hydroxypropyl methylcellulose / hypromellose (ISOPTO TEARS / GONIOVISC) 2.5 % ophthalmic solution Place 1 drop into both eyes 3 (three) times daily as needed for dry eyes.    Marland Kitchen losartan-hydrochlorothiazide (HYZAAR) 100-25 MG tablet Take 1 tablet by mouth daily.     . Multiple Vitamin (MULITIVITAMIN WITH MINERALS) TABS Take 1 tablet by mouth daily with breakfast.     . potassium chloride SA (K-DUR,KLOR-CON) 20 MEQ tablet Take 1 tablet (20 mEq total) by mouth 2 (two) times daily. (Patient taking differently: Take 20 mEq by mouth daily as needed (with lasix for fluid retention.). ) 30 tablet 0  . rivaroxaban (XARELTO) 20 MG TABS tablet Take 1 tablet (20 mg total) by mouth daily with supper. 90 tablet 2  . SIMBRINZA 1-0.2 % SUSP Place 1 drop into both eyes 3 (three) times daily.     . simvastatin (ZOCOR) 20 MG tablet Take 20 mg by mouth at bedtime.     . TRAVATAN Z 0.004 % SOLN ophthalmic solution Place 1 drop into both eyes every evening.     . vitamin B-12 (CYANOCOBALAMIN) 500 MCG tablet Take 500 mcg by mouth daily.    . metoprolol succinate (TOPROL-XL) 25 MG 24 hr tablet Take 0.5  tablets (12.5 mg total) by mouth daily. 45 tablet 2   No current facility-administered medications for this visit.    Facility-Administered Medications Ordered in Other Visits  Medication Dose Route Frequency Provider Last Rate Last Dose  . lidocaine (cardiac) 100 mg/26m (XYLOCAINE) 20 MG/ML injection 2%    Anesthesia Intra-op JMontel Clock CRNA   60 mg at 06/02/17 1343    Allergies:   Trandolapril-verapamil hcl er and Trandolapril-verapamil hcl er    Social History:  The patient  reports that she has never smoked. She has never used smokeless tobacco. She reports that she does not drink alcohol or use drugs.   Family History:  The patient's family history includes Arthritis in her mother; Cancer (age of onset: 77) in her father.    ROS:  Please see the history of present illness.   Otherwise, review of systems are positive for difficulty losing weight.   All other systems are reviewed and negative.    PHYSICAL EXAM: VS:  BP 120/76   Pulse (!) 111   Ht _0  (1.575 m)   Wt 279 lb 1.9 oz (126.6 kg)   SpO2 99%   BMI 51.05 kg/m  , BMI Body mass index is 51.05 kg/m. GEN: Well nourished, well developed, in no acute distress  HEENT: normal  Neck: no JVD, carotid bruits, or masses Cardiac: irregularly irregular, tachycardic; no murmurs, rubs, or gallops,no edema  Respiratory:  clear to auscultation bilaterally, normal work of breathing GI: soft, nontender, nondistended, + BS, obese MS: no deformity or atrophy  Skin: warm and dry, no rash Neuro:  Strength and sensation are intact Psych: euthymic mood, full affect   EKG:   The ekg ordered today demonstrates AFib, RVR today- Was in NSR in 1/19.   Recent Labs: 02/20/2017: ALT 11 04/30/2017: TSH 4.681 06/02/2017: BUN 17; Creatinine, Ser 0.90; Potassium 3.6; Sodium 141 06/09/2017: Hemoglobin 12.3; Platelets 185   Lipid Panel No results found for: CHOL, TRIG, HDL, CHOLHDL, VLDL, LDLCALC, LDLDIRECT   Other studies  Reviewed: Additional studies/ records that were reviewed today with results demonstrating: A. fib clinic note, old ECG.   ASSESSMENT AND PLAN:  1. AFib: Back in atrial fibrillation.  Increase Toprol-XL to 50 mg daily.  It will take a little while for her to get this medication.  In the interim, she will take a full tablet of her 25 mg dose.  She will start the 50 mg once she gets that in the mail.  No symptoms while being out of sinus rhythm.  We will seek the input of the A. fib clinic but for now, we will plan for a strategy of rate control and anticoagulation. 2. Anticoagulated: No bleeding problems.  Continue Xarelto. 3. Hypertensive heart disease: If blood pressure becomes an issue, could stop amlodipine. 4. Morbid obesity: She would benefit long-term from weight loss.   Current medicines are reviewed at length with the patient today.  The patient concerns regarding her medicines were addressed.  The following changes have been made: Increase Toprol  Labs/ tests ordered today include:  No orders of the defined types were placed in this encounter.   Recommend 150 minutes/week of aerobic exercise Low fat, low carb, high fiber diet recommended  Disposition:   FU in 3-4 weeks   Signed, Larae Grooms, MD  09/08/2017 9:51 AM    [facility] [place], [place], [place]  [address] Phone: [phone]; Fax: [phone]

## 2017-09-23 ENCOUNTER — Encounter (HOSPITAL_COMMUNITY): Payer: Self-pay | Admitting: Nurse Practitioner

## 2017-09-23 ENCOUNTER — Ambulatory Visit (HOSPITAL_COMMUNITY)
Admission: RE | Admit: 2017-09-23 | Discharge: 2017-09-23 | Disposition: A | Discharge status: Home / Self Care | Payer: Medicare Other | Source: Ambulatory Visit | Attending: Nurse Practitioner | Admitting: Nurse Practitioner

## 2017-09-23 VITALS — BP 114/72 | HR 114 | Ht 62.0 in | Wt 286.0 lb

## 2017-09-23 DIAGNOSIS — I481 Persistent atrial fibrillation: Secondary | ICD-10-CM | POA: Insufficient documentation

## 2017-09-23 DIAGNOSIS — Z9889 Other specified postprocedural states: Secondary | ICD-10-CM | POA: Insufficient documentation

## 2017-09-23 DIAGNOSIS — I4819 Other persistent atrial fibrillation: Secondary | ICD-10-CM

## 2017-09-23 DIAGNOSIS — Z9049 Acquired absence of other specified parts of digestive tract: Secondary | ICD-10-CM | POA: Insufficient documentation

## 2017-09-23 DIAGNOSIS — Z170 Estrogen receptor positive status [ER+]: Secondary | ICD-10-CM | POA: Insufficient documentation

## 2017-09-23 DIAGNOSIS — I10 Essential (primary) hypertension: Secondary | ICD-10-CM | POA: Insufficient documentation

## 2017-09-23 DIAGNOSIS — G4733 Obstructive sleep apnea (adult) (pediatric): Secondary | ICD-10-CM | POA: Diagnosis not present

## 2017-09-23 DIAGNOSIS — H409 Unspecified glaucoma: Secondary | ICD-10-CM | POA: Insufficient documentation

## 2017-09-23 DIAGNOSIS — J45909 Unspecified asthma, uncomplicated: Secondary | ICD-10-CM | POA: Diagnosis not present

## 2017-09-23 DIAGNOSIS — Z888 Allergy status to other drugs, medicaments and biological substances status: Secondary | ICD-10-CM | POA: Diagnosis not present

## 2017-09-23 DIAGNOSIS — Z8261 Family history of arthritis: Secondary | ICD-10-CM | POA: Insufficient documentation

## 2017-09-23 DIAGNOSIS — E785 Hyperlipidemia, unspecified: Secondary | ICD-10-CM | POA: Insufficient documentation

## 2017-09-23 DIAGNOSIS — Z9011 Acquired absence of right breast and nipple: Secondary | ICD-10-CM | POA: Insufficient documentation

## 2017-09-23 DIAGNOSIS — Z8042 Family history of malignant neoplasm of prostate: Secondary | ICD-10-CM | POA: Insufficient documentation

## 2017-09-23 DIAGNOSIS — Z7901 Long term (current) use of anticoagulants: Secondary | ICD-10-CM | POA: Diagnosis not present

## 2017-09-23 DIAGNOSIS — Z79899 Other long term (current) drug therapy: Secondary | ICD-10-CM | POA: Diagnosis not present

## 2017-09-23 DIAGNOSIS — G629 Polyneuropathy, unspecified: Secondary | ICD-10-CM | POA: Insufficient documentation

## 2017-09-23 MED ORDER — METOPROLOL SUCCINATE ER 50 MG PO TB24: ORAL_TABLET | ORAL | 3 refills | Status: DC

## 2017-09-23 NOTE — Patient Instructions (Signed)
Increase metoprolol to 1/2 tablet in the morning and 1 tablet in the evening 

## 2017-09-23 NOTE — Progress Notes (Signed)
Primary Care Physician: Stephens Shire, MD Referring Physician: [facility] ER f/u   [name] is a 67 y.o. female with a h/o of breast CA s/p R mastectomy not currently undergoing chemo or radiation, HLD, HTN, and a small benign pituitary mass presenting with an episode of lightheadedness and dizziness while eating dinner, 04/30/17, that lasted 5-10 minutes and then gradually resolved.  She denied any chest pain shortness of breath associated with this episode.  Denies any palpitations or chest pain.  EMS noted that she had an irregularly irregular rhythm.   Patient endorses family stress after her brother died recently. She was started on low dose BB and xarelto 20 mg daily, 12/6, which she has taken without missed doses.  In the afib clinic, 12/28, she feels well, although she continues in afib. She does  not smoke, drink alcohol, or excessive amounts of caffeine. Last echo was in 2013 when she had treatment for Rt breast CA with chemo and radiation. She does have OSA anduses CPAP.  F/u cardioversion which was done successfully 06/02/17. She continues in  SR. She feels improved.  Echo updated today and results pending.   Pt is back in afib clinic 5/1. She was seen by Dr.Varanarsi, 4/16 and was back in afib.  She is here  to discuss options to return to rhythm. She reports that he is not aware that she is in afib. Her weight is up 6 lbs but she states that she was out of town last week and did not take her fluiid pill as prescribed but started back this am. Edema/fluid retention issues precedes afib.  Today, she denies symptoms of palpitations, chest pain, shortness of breath, orthopnea, PND, lower extremity edema, dizziness, presyncope, syncope, or neurologic sequela. The patient is tolerating medications without difficulties and is otherwise without complaint today.   Past Medical History:  Diagnosis Date  . Arthritis   . Asthma   . Blood transfusion    hx of last one in 1987  . Breast  cancer ([place])   . Cancer of lower-inner quadrant of female breast ([place]) 07/24/2011   right breast cancer /IDC,stage IIB,er/pr=+,her2=Neg  . Glaucoma   . Heart murmur   . History of chemotherapy    taxotere/cytoxan 6 cycles 09/22/11-01/05/12 day 2 neulasta  . Hyperlipemia   . Hypertension   . Lymphedema    right arm wears a sleeve  . MRSA (methicillin resistant staph aureus) culture positive    08/2011  . Neuromuscular disorder ([place])    peripheral neuropathy hands feet  . OSA (obstructive sleep apnea)    CPAP settings- ?   . Pituitary abnormality ([place]) 05-25-13   small pituitary growth-appears stable- Dr. Christella Noa follows   Past Surgical History:  Procedure Laterality Date  . ABDOMINAL HYSTERECTOMY  2007   TAH/BSO  . BREAST SURGERY     mastectomy RIGHT  . CARDIOVERSION N/A 06/02/2017   Procedure: CARDIOVERSION;  Surgeon: Dorothy Spark, MD;  Location: [place];  Service: Cardiovascular;  Laterality: N/A;  . CARPAL TUNNEL RELEASE Right    2012  . CHOLECYSTECTOMY  1987   open - Dr Lindon Romp  . COLONOSCOPY N/A 06/13/2013   Procedure: COLONOSCOPY;  Surgeon: Juanita Craver, MD;  [facility];  Service: Endoscopy;  Laterality: N/A;  . DILATION AND CURETTAGE OF UTERUS     x3  . EYE SURGERY     laser eye surgery for glaucoma  . MASTECTOMY MODIFIED RADICAL  08/14/11   right , ER/PR +,  HER2 -  . port-a-cath insertion     port-a-cath removal 1'14  . PORT-A-CATH REMOVAL  06/22/2012   Procedure: MINOR REMOVAL PORT-A-CATH;  Surgeon: Haywood Lasso, MD;  Location: [place];  Service: General;  Laterality: Left;  . PORTACATH PLACEMENT  09/11/2011   Procedure: INSERTION PORT-A-CATH;  Surgeon: Joyice Faster. Cornett, MD;  Location: [facility];  Service: General;  Laterality: N/A;  Insert of Port  . TUBAL LIGATION      Current Outpatient Medications  Medication Sig Dispense Refill  . acetaminophen (TYLENOL) 325 MG tablet Take 325-650 mg by mouth every 6 (six) hours as needed  (for pain.).    Marland Kitchen albuterol (PROAIR HFA) 108 (90 Base) MCG/ACT inhaler Inhale 2 puffs into the lungs every 6 (six) hours as needed for wheezing or shortness of breath. For shortness of breath    . amLODipine (NORVASC) 2.5 MG tablet Take 2.5 mg by mouth daily.    Marland Kitchen anastrozole (ARIMIDEX) 1 MG tablet Take 1 tablet (1 mg total) by mouth daily. 90 tablet 3  . cabergoline (DOSTINEX) 0.5 MG tablet Take 0.5 mg by mouth 2 (two) times a week. Tuesday and Thursday    . Cholecalciferol (VITAMIN D3) 1000 UNITS CAPS Take 1,000 Units by mouth every other day.     . Fluticasone-Salmeterol (ADVAIR DISKUS) 100-50 MCG/DOSE AEPB Inhale 1 puff into the lungs daily.     . furosemide (LASIX) 20 MG tablet Take 20 mg by mouth daily as needed for fluid.     Marland Kitchen GLUCOSAMINE HCL PO Take 1 tablet by mouth daily.    . hydroxypropyl methylcellulose / hypromellose (ISOPTO TEARS / GONIOVISC) 2.5 % ophthalmic solution Place 1 drop into both eyes 3 (three) times daily as needed for dry eyes.    Marland Kitchen losartan-hydrochlorothiazide (HYZAAR) 100-25 MG tablet Take 1 tablet by mouth daily.     . metoprolol succinate (TOPROL-XL) 50 MG 24 hr tablet Take 1/2 tablet in the morning and 1 tablet in the evening 90 tablet 3  . Multiple Vitamin (MULITIVITAMIN WITH MINERALS) TABS Take 1 tablet by mouth daily with breakfast.     . potassium chloride SA (K-DUR,KLOR-CON) 20 MEQ tablet Take 1 tablet (20 mEq total) by mouth 2 (two) times daily. (Patient taking differently: Take 20 mEq by mouth daily as needed (with lasix for fluid retention.). ) 30 tablet 0  . rivaroxaban (XARELTO) 20 MG TABS tablet Take 1 tablet (20 mg total) by mouth daily with supper. 90 tablet 2  . SIMBRINZA 1-0.2 % SUSP Place 1 drop into both eyes 3 (three) times daily.     . simvastatin (ZOCOR) 20 MG tablet Take 20 mg by mouth at bedtime.     . TRAVATAN Z 0.004 % SOLN ophthalmic solution Place 1 drop into both eyes every evening.     . vitamin B-12 (CYANOCOBALAMIN) 500 MCG tablet Take  500 mcg by mouth daily.     No current facility-administered medications for this encounter.    Facility-Administered Medications Ordered in Other Encounters  Medication Dose Route Frequency Provider Last Rate Last Dose  . lidocaine (cardiac) 100 mg/70m (XYLOCAINE) 20 MG/ML injection 2%    Anesthesia Intra-op JMontel Clock CRNA   60 mg at 06/02/17 1343    Allergies  Allergen Reactions  . Trandolapril-Verapamil Hcl Er Swelling    Causes lips to swell ([name] is the name brand)  . Trandolapril-Verapamil Hcl Er     Social History   Socioeconomic History  . Marital status:  Married    Spouse name: Not on file  . Number of children: Not on file  . Years of education: Not on file  . Highest education level: Not on file  Occupational History  . Not on file  Social Needs  . Financial resource strain: Not on file  . Food insecurity:    Worry: Not on file    Inability: Not on file  . Transportation needs:    Medical: Not on file    Non-medical: Not on file  Tobacco Use  . Smoking status: Never Smoker  . Smokeless tobacco: Never Used  Substance and Sexual Activity  . Alcohol use: No  . Drug use: No  . Sexual activity: Yes    Birth control/protection: Post-menopausal, Surgical  Lifestyle  . Physical activity:    Days per week: Not on file    Minutes per session: Not on file  . Stress: Not on file  Relationships  . Social connections:    Talks on phone: Not on file    Gets together: Not on file    Attends religious service: Not on file    Active member of club or organization: Not on file    Attends meetings of clubs or organizations: Not on file    Relationship status: Not on file  . Intimate partner violence:    Fear of current or ex partner: Not on file    Emotionally abused: Not on file    Physically abused: Not on file    Forced sexual activity: Not on file  Other Topics Concern  . Not on file  Social History Narrative  . Not on file    Family History    Problem Relation Age of Onset  . Cancer Father 1       prostate ca  . Arthritis Mother   . Breast cancer Neg Hx     ROS- All systems are reviewed and negative except as per the HPI above  Physical Exam: Vitals:   09/23/17 0945  BP: 114/72  Pulse: (!) 114  SpO2: 98%  Weight: 286 lb (129.7 kg)  Height: 5' 2"  (1.575 m)   Wt Readings from Last 3 Encounters:  09/23/17 286 lb (129.7 kg)  09/08/17 279 lb 1.9 oz (126.6 kg)  06/09/17 278 lb (126.1 kg)    Labs: Lab Results  Component Value Date   NA 141 06/02/2017   K 3.6 06/02/2017   CL 103 06/02/2017   CO2 27 05/22/2017   GLUCOSE 101 (H) 06/02/2017   BUN 17 06/02/2017   CREATININE 0.90 06/02/2017   CALCIUM 9.1 05/22/2017   No results found for: INR No results found for: CHOL, HDL, LDLCALC, TRIG   GEN- The patient is well appearing, alert and oriented x 3 today.   Head- normocephalic, atraumatic Eyes-  Sclera clear, conjunctiva pink Ears- hearing intact Oropharynx- clear Neck- supple, no JVP Lymph- no cervical lymphadenopathy Lungs- Clear to ausculation bilaterally, normal work of breathing Heart- regular rate and rhythm, no murmurs, rubs or gallops, PMI not laterally displaced GI- soft, NT, ND, + BS Extremities- no clubbing, cyanosis, or edema MS- no significant deformity or atrophy Skin- no rash or lesion Psych- euthymic mood, full affect Neuro- strength and sensation are intact  EKG- NSR at 77 bpm, pr int 176 ms, qrs int 68 ms, qtc 439 ms Epic records reviewed    Assessment and Plan: 1. Persistent afib, new onset, 26-Jun-2017 Possibly 2/2 death of brother Had successful cardioversion but has returned  to afib Pt is asymptomatic  Discussed options, start antiarrythmic to restore SR or live in afib I would like to see her better rate controlled Will increase Toprol 50 mg at hs, to add another 25 mg in am Pt will return to diuretic dose Continue CPAP for OSA Regular exercise and weight loss recommended  2.  HTN Stable  At this pt, pt is agreeable for better rate control but is not sure she wants to take more meds to restore SR I will bring back in one week and will see the effects of increase in BB and we will discuss again   Butch Penny C. Carroll, [facility] [address] [place], [place] [address] [phone]

## 2017-10-07 ENCOUNTER — Encounter (HOSPITAL_COMMUNITY): Payer: Self-pay | Admitting: Nurse Practitioner

## 2017-10-07 ENCOUNTER — Ambulatory Visit (HOSPITAL_COMMUNITY)
Admission: RE | Admit: 2017-10-07 | Discharge: 2017-10-07 | Disposition: A | Discharge status: Home / Self Care | Payer: Medicare Other | Source: Ambulatory Visit | Attending: Nurse Practitioner | Admitting: Nurse Practitioner

## 2017-10-07 VITALS — BP 122/66 | HR 122 | Ht 62.0 in | Wt 282.0 lb

## 2017-10-07 DIAGNOSIS — Z79891 Long term (current) use of opiate analgesic: Secondary | ICD-10-CM | POA: Insufficient documentation

## 2017-10-07 DIAGNOSIS — I4819 Other persistent atrial fibrillation: Secondary | ICD-10-CM

## 2017-10-07 DIAGNOSIS — E785 Hyperlipidemia, unspecified: Secondary | ICD-10-CM | POA: Insufficient documentation

## 2017-10-07 DIAGNOSIS — Z9049 Acquired absence of other specified parts of digestive tract: Secondary | ICD-10-CM | POA: Insufficient documentation

## 2017-10-07 DIAGNOSIS — Z888 Allergy status to other drugs, medicaments and biological substances status: Secondary | ICD-10-CM | POA: Diagnosis not present

## 2017-10-07 DIAGNOSIS — Z8042 Family history of malignant neoplasm of prostate: Secondary | ICD-10-CM | POA: Diagnosis not present

## 2017-10-07 DIAGNOSIS — H409 Unspecified glaucoma: Secondary | ICD-10-CM | POA: Diagnosis not present

## 2017-10-07 DIAGNOSIS — Z79899 Other long term (current) drug therapy: Secondary | ICD-10-CM | POA: Insufficient documentation

## 2017-10-07 DIAGNOSIS — Z9221 Personal history of antineoplastic chemotherapy: Secondary | ICD-10-CM | POA: Diagnosis not present

## 2017-10-07 DIAGNOSIS — G629 Polyneuropathy, unspecified: Secondary | ICD-10-CM | POA: Insufficient documentation

## 2017-10-07 DIAGNOSIS — Z803 Family history of malignant neoplasm of breast: Secondary | ICD-10-CM | POA: Insufficient documentation

## 2017-10-07 DIAGNOSIS — Z9011 Acquired absence of right breast and nipple: Secondary | ICD-10-CM | POA: Insufficient documentation

## 2017-10-07 DIAGNOSIS — Z7901 Long term (current) use of anticoagulants: Secondary | ICD-10-CM | POA: Insufficient documentation

## 2017-10-07 DIAGNOSIS — Z7984 Long term (current) use of oral hypoglycemic drugs: Secondary | ICD-10-CM | POA: Insufficient documentation

## 2017-10-07 DIAGNOSIS — Z7951 Long term (current) use of inhaled steroids: Secondary | ICD-10-CM | POA: Diagnosis not present

## 2017-10-07 DIAGNOSIS — M1990 Unspecified osteoarthritis, unspecified site: Secondary | ICD-10-CM | POA: Insufficient documentation

## 2017-10-07 DIAGNOSIS — Z452 Encounter for adjustment and management of vascular access device: Secondary | ICD-10-CM | POA: Diagnosis not present

## 2017-10-07 DIAGNOSIS — D352 Benign neoplasm of pituitary gland: Secondary | ICD-10-CM | POA: Diagnosis not present

## 2017-10-07 DIAGNOSIS — Z9889 Other specified postprocedural states: Secondary | ICD-10-CM | POA: Diagnosis not present

## 2017-10-07 DIAGNOSIS — G4733 Obstructive sleep apnea (adult) (pediatric): Secondary | ICD-10-CM | POA: Insufficient documentation

## 2017-10-07 DIAGNOSIS — I481 Persistent atrial fibrillation: Secondary | ICD-10-CM

## 2017-10-07 DIAGNOSIS — J45909 Unspecified asthma, uncomplicated: Secondary | ICD-10-CM | POA: Insufficient documentation

## 2017-10-07 DIAGNOSIS — Z8261 Family history of arthritis: Secondary | ICD-10-CM | POA: Insufficient documentation

## 2017-10-07 DIAGNOSIS — I10 Essential (primary) hypertension: Secondary | ICD-10-CM | POA: Diagnosis not present

## 2017-10-07 DIAGNOSIS — Z853 Personal history of malignant neoplasm of breast: Secondary | ICD-10-CM | POA: Diagnosis not present

## 2017-10-07 MED ORDER — METOPROLOL SUCCINATE ER 50 MG PO TB24: 50.0000 mg | ORAL_TABLET | Freq: Two times a day (BID) | ORAL | 2 refills | Status: DC

## 2017-10-07 NOTE — Patient Instructions (Signed)
Metoprolol to 1 tablet twice a day

## 2017-10-07 NOTE — Progress Notes (Signed)
Primary Care Physician: Stephens Shire, MD Referring Physician: [facility] ER f/u   [name] is a 67 y.o. female with a h/o of breast CA s/p R mastectomy not currently undergoing chemo or radiation, HLD, HTN, and a small benign pituitary mass presenting with an episode of lightheadedness and dizziness while eating dinner, 04/30/17, that lasted 5-10 minutes and then gradually resolved.  She denied any chest pain shortness of breath associated with this episode.  Denies any palpitations or chest pain.  EMS noted that she had an irregularly irregular rhythm.   Patient endorses family stress after her brother died recently. She was started on low dose BB and xarelto 20 mg daily, 12/6, which she has taken without missed doses.  In the afib clinic, 12/28, she feels well, although she continues in afib. She does  not smoke, drink alcohol, or excessive amounts of caffeine. Last echo was in 2013 when she had treatment for Rt breast CA with chemo and radiation. She does have OSA anduses CPAP.  F/u cardioversion which was done successfully 06/02/17. She continues in  SR. She feels improved.  Echo updated today and results pending.   Pt is back in afib clinic 5/1. She was seen by Dr.Varanarsi, 4/16 and was back in afib.  She is here  to discuss options to return to rhythm. She reports that he is not aware that she is in afib. Her weight is up 6 lbs but she states that she was out of town last week and did not take her fluiid pill as prescribed but started back this am. Edema/fluid retention issues precedes afib. Plan was to increase rate control and f/u.  On presentation HR was in the  120's with walking, but in upper 80' s/90's with rest. She did increase BB as prescribed on last visit. She still states that she is asymptomatic and does not want to do anything different to return to SR at this time. Weight  is stable, actually down 4 lbs.   Today, she denies symptoms of palpitations, chest pain, shortness of  breath, orthopnea, PND, lower extremity edema, dizziness, presyncope, syncope, or neurologic sequela. The patient is tolerating medications without difficulties and is otherwise without complaint today.   Past Medical History:  Diagnosis Date  . Arthritis   . Asthma   . Blood transfusion    hx of last one in 1987  . Breast cancer ([place])   . Cancer of lower-inner quadrant of female breast ([place]) 07/24/2011   right breast cancer /IDC,stage IIB,er/pr=+,her2=Neg  . Glaucoma   . Heart murmur   . History of chemotherapy    taxotere/cytoxan 6 cycles 09/22/11-01/05/12 day 2 neulasta  . Hyperlipemia   . Hypertension   . Lymphedema    right arm wears a sleeve  . MRSA (methicillin resistant staph aureus) culture positive    08/2011  . Neuromuscular disorder ([place])    peripheral neuropathy hands feet  . OSA (obstructive sleep apnea)    CPAP settings- ?   . Pituitary abnormality ([place]) 05-25-13   small pituitary growth-appears stable- Dr. Christella Noa follows   Past Surgical History:  Procedure Laterality Date  . ABDOMINAL HYSTERECTOMY  2007   TAH/BSO  . BREAST SURGERY     mastectomy RIGHT  . CARDIOVERSION N/A 06/02/2017   Procedure: CARDIOVERSION;  Surgeon: Dorothy Spark, MD;  Location: [place];  Service: Cardiovascular;  Laterality: N/A;  . CARPAL TUNNEL RELEASE Right    2012  . CHOLECYSTECTOMY  1987  open - Dr Lindon Romp  . COLONOSCOPY N/A 06/13/2013   Procedure: COLONOSCOPY;  Surgeon: Juanita Craver, MD;  [facility];  Service: Endoscopy;  Laterality: N/A;  . DILATION AND CURETTAGE OF UTERUS     x3  . EYE SURGERY     laser eye surgery for glaucoma  . MASTECTOMY MODIFIED RADICAL  08/14/11   right , ER/PR +, HER2 -  . port-a-cath insertion     port-a-cath removal 1'14  . PORT-A-CATH REMOVAL  06/22/2012   Procedure: MINOR REMOVAL PORT-A-CATH;  Surgeon: Haywood Lasso, MD;  Location: [place];  Service: General;  Laterality: Left;  . PORTACATH PLACEMENT   09/11/2011   Procedure: INSERTION PORT-A-CATH;  Surgeon: Joyice Faster. Cornett, MD;  [facility];  Service: General;  Laterality: N/A;  Insert of Port  . TUBAL LIGATION      Current Outpatient Medications  Medication Sig Dispense Refill  . acetaminophen (TYLENOL) 325 MG tablet Take 325-650 mg by mouth every 6 (six) hours as needed (for pain.).    Marland Kitchen albuterol (PROAIR HFA) 108 (90 Base) MCG/ACT inhaler Inhale 2 puffs into the lungs every 6 (six) hours as needed for wheezing or shortness of breath. For shortness of breath    . amLODipine (NORVASC) 2.5 MG tablet Take 2.5 mg by mouth daily.    Marland Kitchen anastrozole (ARIMIDEX) 1 MG tablet Take 1 tablet (1 mg total) by mouth daily. 90 tablet 3  . cabergoline (DOSTINEX) 0.5 MG tablet Take 0.5 mg by mouth 2 (two) times a week. Tuesday and Thursday    . Cholecalciferol (VITAMIN D3) 1000 UNITS CAPS Take 1,000 Units by mouth every other day.     . Fluticasone-Salmeterol (ADVAIR DISKUS) 100-50 MCG/DOSE AEPB Inhale 1 puff into the lungs daily.     . furosemide (LASIX) 20 MG tablet Take 20 mg by mouth daily as needed for fluid.     Marland Kitchen GLUCOSAMINE HCL PO Take 1 tablet by mouth daily.    . hydroxypropyl methylcellulose / hypromellose (ISOPTO TEARS / GONIOVISC) 2.5 % ophthalmic solution Place 1 drop into both eyes 3 (three) times daily as needed for dry eyes.    Marland Kitchen losartan-hydrochlorothiazide (HYZAAR) 100-25 MG tablet Take 1 tablet by mouth daily.     . metoprolol succinate (TOPROL-XL) 50 MG 24 hr tablet Take 1 tablet (50 mg total) by mouth 2 (two) times daily. 180 tablet 2  . Multiple Vitamin (MULITIVITAMIN WITH MINERALS) TABS Take 1 tablet by mouth daily with breakfast.     . potassium chloride SA (K-DUR,KLOR-CON) 20 MEQ tablet Take 1 tablet (20 mEq total) by mouth 2 (two) times daily. (Patient taking differently: Take 20 mEq by mouth daily as needed (with lasix for fluid retention.). ) 30 tablet 0  . rivaroxaban (XARELTO) 20 MG TABS tablet Take 1 tablet (20 mg total)  by mouth daily with supper. 90 tablet 2  . SIMBRINZA 1-0.2 % SUSP Place 1 drop into both eyes 3 (three) times daily.     . simvastatin (ZOCOR) 20 MG tablet Take 20 mg by mouth at bedtime.     . TRAVATAN Z 0.004 % SOLN ophthalmic solution Place 1 drop into both eyes every evening.     . vitamin B-12 (CYANOCOBALAMIN) 500 MCG tablet Take 500 mcg by mouth daily.     No current facility-administered medications for this encounter.    Facility-Administered Medications Ordered in Other Encounters  Medication Dose Route Frequency Provider Last Rate Last Dose  . lidocaine (cardiac) 100  mg/102m (XYLOCAINE) 20 MG/ML injection 2%    Anesthesia Intra-op JMontel Clock CRNA   60 mg at 06/02/17 1343    Allergies  Allergen Reactions  . Trandolapril-Verapamil Hcl Er Swelling    Causes lips to swell ([name] is the name brand)  . Trandolapril-Verapamil Hcl Er     Social History   Socioeconomic History  . Marital status: Married    Spouse name: Not on file  . Number of children: Not on file  . Years of education: Not on file  . Highest education level: Not on file  Occupational History  . Not on file  Social Needs  . Financial resource strain: Not on file  . Food insecurity:    Worry: Not on file    Inability: Not on file  . Transportation needs:    Medical: Not on file    Non-medical: Not on file  Tobacco Use  . Smoking status: Never Smoker  . Smokeless tobacco: Never Used  Substance and Sexual Activity  . Alcohol use: No  . Drug use: No  . Sexual activity: Yes    Birth control/protection: Post-menopausal, Surgical  Lifestyle  . Physical activity:    Days per week: Not on file    Minutes per session: Not on file  . Stress: Not on file  Relationships  . Social connections:    Talks on phone: Not on file    Gets together: Not on file    Attends religious service: Not on file    Active member of club or organization: Not on file    Attends meetings of clubs or organizations:  Not on file    Relationship status: Not on file  . Intimate partner violence:    Fear of current or ex partner: Not on file    Emotionally abused: Not on file    Physically abused: Not on file    Forced sexual activity: Not on file  Other Topics Concern  . Not on file  Social History Narrative  . Not on file    Family History  Problem Relation Age of Onset  . Cancer Father 729      prostate ca  . Arthritis Mother   . Breast cancer Neg Hx     ROS- All systems are reviewed and negative except as per the HPI above  Physical Exam: Vitals:   10/07/17 1018  BP: 122/66  Pulse: (!) 122  Weight: 282 lb (127.9 kg)  Height: 5' 2"  (1.575 m)   Wt Readings from Last 3 Encounters:  10/07/17 282 lb (127.9 kg)  09/23/17 286 lb (129.7 kg)  09/08/17 279 lb 1.9 oz (126.6 kg)    Labs: Lab Results  Component Value Date   NA 141 06/02/2017   K 3.6 06/02/2017   CL 103 06/02/2017   CO2 27 05/22/2017   GLUCOSE 101 (H) 06/02/2017   BUN 17 06/02/2017   CREATININE 0.90 06/02/2017   CALCIUM 9.1 05/22/2017   No results found for: INR No results found for: CHOL, HDL, LDLCALC, TRIG   GEN- The patient is well appearing, alert and oriented x 3 today.   Head- normocephalic, atraumatic Eyes-  Sclera clear, conjunctiva pink Ears- hearing intact Oropharynx- clear Neck- supple, no JVP Lymph- no cervical lymphadenopathy Lungs- Clear to ausculation bilaterally, normal work of breathing Heart-iregular rate and rhythm, no murmurs, rubs or gallops, PMI not laterally displaced GI- soft, NT, ND, + BS Extremities- no clubbing, cyanosis, or edema MS- no significant deformity  or atrophy Skin- no rash or lesion Psych- euthymic mood, full affect Neuro- strength and sensation are intact  EKG- afib at 122 bpm, qrs int 66 ms, qtc 450 ms Epic records reviewed    Assessment and Plan: 1. Persistent afib, new onset, 05/2017 Had successful cardioversion but has returned to afib Pt is asymptomatic    Discussed options, start antiarrythmic to restore SR/cardioversion or live in afib I would like to see her better rate controlled Will increase Toprol 50 mg to bid Continue  diuretic dose, weight is stable Continue CPAP for OSA Regular exercise and weight loss recommended  2. HTN Stable   F/u in 2-4 weeks   Geroge Baseman. Karion Cudd, [facility] [address] [place], [place] [address] [phone]

## 2017-11-09 ENCOUNTER — Ambulatory Visit (HOSPITAL_COMMUNITY)
Admission: RE | Admit: 2017-11-09 | Discharge: 2017-11-09 | Disposition: A | Discharge status: Home / Self Care | Payer: Medicare Other | Source: Ambulatory Visit | Attending: Nurse Practitioner | Admitting: Nurse Practitioner

## 2017-11-09 ENCOUNTER — Encounter (HOSPITAL_COMMUNITY): Payer: Self-pay | Admitting: Nurse Practitioner

## 2017-11-09 VITALS — BP 122/76 | HR 103 | Ht 62.0 in | Wt 282.0 lb

## 2017-11-09 DIAGNOSIS — Z9221 Personal history of antineoplastic chemotherapy: Secondary | ICD-10-CM | POA: Insufficient documentation

## 2017-11-09 DIAGNOSIS — Z9049 Acquired absence of other specified parts of digestive tract: Secondary | ICD-10-CM | POA: Insufficient documentation

## 2017-11-09 DIAGNOSIS — Z9851 Tubal ligation status: Secondary | ICD-10-CM | POA: Insufficient documentation

## 2017-11-09 DIAGNOSIS — Z79899 Other long term (current) drug therapy: Secondary | ICD-10-CM | POA: Insufficient documentation

## 2017-11-09 DIAGNOSIS — Z9011 Acquired absence of right breast and nipple: Secondary | ICD-10-CM | POA: Insufficient documentation

## 2017-11-09 DIAGNOSIS — I4819 Other persistent atrial fibrillation: Secondary | ICD-10-CM

## 2017-11-09 DIAGNOSIS — I481 Persistent atrial fibrillation: Secondary | ICD-10-CM | POA: Insufficient documentation

## 2017-11-09 DIAGNOSIS — M1990 Unspecified osteoarthritis, unspecified site: Secondary | ICD-10-CM | POA: Diagnosis not present

## 2017-11-09 DIAGNOSIS — G4733 Obstructive sleep apnea (adult) (pediatric): Secondary | ICD-10-CM | POA: Diagnosis not present

## 2017-11-09 DIAGNOSIS — Z853 Personal history of malignant neoplasm of breast: Secondary | ICD-10-CM | POA: Diagnosis not present

## 2017-11-09 DIAGNOSIS — Z90710 Acquired absence of both cervix and uterus: Secondary | ICD-10-CM | POA: Insufficient documentation

## 2017-11-09 DIAGNOSIS — J45909 Unspecified asthma, uncomplicated: Secondary | ICD-10-CM | POA: Diagnosis not present

## 2017-11-09 DIAGNOSIS — Z6841 Body Mass Index (BMI) 40.0 and over, adult: Secondary | ICD-10-CM | POA: Diagnosis not present

## 2017-11-09 DIAGNOSIS — Z7901 Long term (current) use of anticoagulants: Secondary | ICD-10-CM | POA: Insufficient documentation

## 2017-11-09 DIAGNOSIS — E785 Hyperlipidemia, unspecified: Secondary | ICD-10-CM | POA: Insufficient documentation

## 2017-11-09 DIAGNOSIS — I10 Essential (primary) hypertension: Secondary | ICD-10-CM | POA: Insufficient documentation

## 2017-11-09 MED ORDER — METOPROLOL SUCCINATE ER 50 MG PO TB24: ORAL_TABLET | ORAL | 2 refills | Status: DC

## 2017-11-09 NOTE — Patient Instructions (Addendum)
Increase metoprolol to 1 and 1/2 tablets twice a day (75mg  twice a day)  Weight every morning at the same time every day --- If more than 2lb weight gain overnight take an as needed lasix on that day   Low-Sodium Eating Plan Sodium, which is an element that makes up salt, helps you maintain a healthy balance of fluids in your body. Too much sodium can increase your blood pressure and cause fluid and waste to be held in your body. Your health care provider or dietitian may recommend following this plan if you have high blood pressure (hypertension), kidney disease, liver disease, or heart failure. Eating less sodium can help lower your blood pressure, reduce swelling, and protect your heart, liver, and kidneys. What are tips for following this plan? General guidelines  Most people on this plan should limit their sodium intake to 1,500-2,000 mg (milligrams) of sodium each day. Reading food labels  The Nutrition Facts label lists the amount of sodium in one serving of the food. If you eat more than one serving, you must multiply the listed amount of sodium by the number of servings.  Choose foods with less than 140 mg of sodium per serving.  Avoid foods with 300 mg of sodium or more per serving. Shopping  Look for lower-sodium products, often labeled as "low-sodium" or "no salt added."  Always check the sodium content even if foods are labeled as "unsalted" or "no salt added".  Buy fresh foods. ? Avoid canned foods and premade or frozen meals. ? Avoid canned, cured, or processed meats  Buy breads that have less than 80 mg of sodium per slice. Cooking  Eat more home-cooked food and less restaurant, buffet, and fast food.  Avoid adding salt when cooking. Use salt-free seasonings or herbs instead of table salt or sea salt. Check with your health care provider or pharmacist before using salt substitutes.  Cook with plant-based oils, such as canola, sunflower, or olive oil. Meal  planning  When eating at a restaurant, ask that your food be prepared with less salt or no salt, if possible.  Avoid foods that contain MSG (monosodium glutamate). MSG is sometimes added to [place] food, bouillon, and some canned foods. What foods are recommended? The items listed may not be a complete list. Talk with your dietitian about what dietary choices are best for you. Grains Low-sodium cereals, including oats, puffed wheat and rice, and shredded wheat. Low-sodium crackers. Unsalted rice. Unsalted pasta. Low-sodium bread. Whole-grain breads and whole-grain pasta. Vegetables Fresh or frozen vegetables. "No salt added" canned vegetables. "No salt added" tomato sauce and paste. Low-sodium or reduced-sodium tomato and vegetable juice. Fruits Fresh, frozen, or canned fruit. Fruit juice. Meats and other protein foods Fresh or frozen (no salt added) meat, poultry, seafood, and fish. Low-sodium canned tuna and salmon. Unsalted nuts. Dried peas, beans, and lentils without added salt. Unsalted canned beans. Eggs. Unsalted nut butters. Dairy Milk. Soy milk. Cheese that is naturally low in sodium, such as ricotta cheese, fresh mozzarella, or Swiss cheese Low-sodium or reduced-sodium cheese. Cream cheese. Yogurt. Fats and oils Unsalted butter. Unsalted margarine with no trans fat. Vegetable oils such as canola or olive oils. Seasonings and other foods Fresh and dried herbs and spices. Salt-free seasonings. Low-sodium mustard and ketchup. Sodium-free salad dressing. Sodium-free light mayonnaise. Fresh or refrigerated horseradish. Lemon juice. Vinegar. Homemade, reduced-sodium, or low-sodium soups. Unsalted popcorn and pretzels. Low-salt or salt-free chips. What foods are not recommended? The items listed may not be a  complete list. Talk with your dietitian about what dietary choices are best for you. Grains Instant hot cereals. Bread stuffing, pancake, and biscuit mixes. Croutons. Seasoned rice or  pasta mixes. Noodle soup cups. Boxed or frozen macaroni and cheese. Regular salted crackers. Self-rising flour. Vegetables Sauerkraut, pickled vegetables, and relishes. Olives. [place] fries. Onion rings. Regular canned vegetables (not low-sodium or reduced-sodium). Regular canned tomato sauce and paste (not low-sodium or reduced-sodium). Regular tomato and vegetable juice (not low-sodium or reduced-sodium). Frozen vegetables in sauces. Meats and other protein foods Meat or fish that is salted, canned, smoked, spiced, or pickled. Bacon, ham, sausage, hotdogs, corned beef, chipped beef, packaged lunch meats, salt pork, jerky, pickled herring, anchovies, regular canned tuna, sardines, salted nuts. Dairy Processed cheese and cheese spreads. Cheese curds. Blue cheese. Feta cheese. String cheese. Regular cottage cheese. Buttermilk. Canned milk. Fats and oils Salted butter. Regular margarine. Ghee. Bacon fat. Seasonings and other foods Onion salt, garlic salt, seasoned salt, table salt, and sea salt. Canned and packaged gravies. Worcestershire sauce. Tartar sauce. Barbecue sauce. Teriyaki sauce. Soy sauce, including reduced-sodium. Steak sauce. Fish sauce. Oyster sauce. Cocktail sauce. Horseradish that you find on the shelf. Regular ketchup and mustard. Meat flavorings and tenderizers. Bouillon cubes. Hot sauce and Tabasco sauce. Premade or packaged marinades. Premade or packaged taco seasonings. Relishes. Regular salad dressings. Salsa. Potato and tortilla chips. Corn chips and puffs. Salted popcorn and pretzels. Canned or dried soups. Pizza. Frozen entrees and pot pies. Summary  Eating less sodium can help lower your blood pressure, reduce swelling, and protect your heart, liver, and kidneys.  Most people on this plan should limit their sodium intake to 1,500-2,000 mg (milligrams) of sodium each day.  Canned, boxed, and frozen foods are high in sodium. Restaurant foods, fast foods, and pizza are also  very high in sodium. You also get sodium by adding salt to food.  Try to cook at home, eat more fresh fruits and vegetables, and eat less fast food, canned, processed, or prepared foods. This information is not intended to replace advice given to you by your health care provider. Make sure you discuss any questions you have with your health care provider. Document Released: 11/01/2001 Document Revised: 05/05/2016 Document Reviewed: 05/05/2016 Elsevier Interactive Patient Education  [employer].

## 2017-11-09 NOTE — Progress Notes (Signed)
Primary Care Physician: Stephens Shire, MD Primary Cardiologist: Dr Irish Lack Primary Electrophysiologist: Dr Rayann Heman    [name] is a 67 y.o. female with a history of persistent atrial fibrillation who presents for follow-up in the [facility].  The patient has previously required cardioversion.  She is not typically symptomatic with her afib but has had some difficulty with fluid retention with RVR.   Recently, she saw Butch Penny and her metoprolol was increased.  She reports doing well at this time. Today, she denies symptoms of palpitations, chest pain, shortness of breath, orthopnea, PND, lower extremity edema, dizziness, presyncope, syncope, snoring, daytime somnolence, bleeding, or neurologic sequela. The patient is tolerating medications without difficulties and is otherwise without complaint today.    Atrial Fibrillation Risk Factors:  she does have symptoms or diagnosis of sleep apnea. she is compliant with CPAP therapy.  she does not have a history of rheumatic fever.  she does not have a history of alcohol use.  she has a BMI of Body mass index is 51.58 kg/m.Marland Kitchen Filed Weights   11/09/17 1037  Weight: 282 lb (127.9 kg)    LA size: 47   Atrial Fibrillation Management history:  Previous antiarrhythmic drugs: none  Previous cardioversions: yes, 06/02/17  Previous ablations: none  CHADS2VASC score: 3  Anticoagulation history: on xarelto   Past Medical History:  Diagnosis Date  . Arthritis   . Asthma   . Blood transfusion    hx of last one in 1987  . Breast cancer ([place])   . Cancer of lower-inner quadrant of female breast ([place]) 07/24/2011   right breast cancer /IDC,stage IIB,er/pr=+,her2=Neg  . Glaucoma   . Heart murmur   . History of chemotherapy    taxotere/cytoxan 6 cycles 09/22/11-01/05/12 day 2 neulasta  . Hyperlipemia   . Hypertension   . Lymphedema    right arm wears a sleeve  . MRSA (methicillin resistant staph  aureus) culture positive    08/2011  . Neuromuscular disorder ([place])    peripheral neuropathy hands feet  . OSA (obstructive sleep apnea)    CPAP settings- ?   . Pituitary abnormality ([place]) 05-25-13   small pituitary growth-appears stable- Dr. Christella Noa follows   Past Surgical History:  Procedure Laterality Date  . ABDOMINAL HYSTERECTOMY  2007   TAH/BSO  . BREAST SURGERY     mastectomy RIGHT  . CARDIOVERSION N/A 06/02/2017   Procedure: CARDIOVERSION;  Surgeon: Dorothy Spark, MD;  Location: [facility];  Service: Cardiovascular;  Laterality: N/A;  . CARPAL TUNNEL RELEASE Right    2012  . CHOLECYSTECTOMY  1987   open - Dr Lindon Romp  . COLONOSCOPY N/A 06/13/2013   Procedure: COLONOSCOPY;  Surgeon: Juanita Craver, MD;  Location: [facility];  Service: Endoscopy;  Laterality: N/A;  . DILATION AND CURETTAGE OF UTERUS     x3  . EYE SURGERY     laser eye surgery for glaucoma  . MASTECTOMY MODIFIED RADICAL  08/14/11   right , ER/PR +, HER2 -  . port-a-cath insertion     port-a-cath removal 1'14  . PORT-A-CATH REMOVAL  06/22/2012   Procedure: MINOR REMOVAL PORT-A-CATH;  Surgeon: Haywood Lasso, MD;  Location: [place];  Service: General;  Laterality: Left;  . PORTACATH PLACEMENT  09/11/2011   Procedure: INSERTION PORT-A-CATH;  Surgeon: Joyice Faster. Cornett, MD;  Location: [facility];  Service: General;  Laterality: N/A;  Insert of Port  . TUBAL LIGATION  Current Outpatient Medications  Medication Sig Dispense Refill  . acetaminophen (TYLENOL) 325 MG tablet Take 325-650 mg by mouth every 6 (six) hours as needed (for pain.).    Marland Kitchen albuterol (PROAIR HFA) 108 (90 Base) MCG/ACT inhaler Inhale 2 puffs into the lungs every 6 (six) hours as needed for wheezing or shortness of breath. For shortness of breath    . amLODipine (NORVASC) 2.5 MG tablet Take 2.5 mg by mouth daily.    Marland Kitchen anastrozole (ARIMIDEX) 1 MG tablet Take 1 tablet (1 mg total) by mouth daily. 90 tablet 3  . cabergoline  (DOSTINEX) 0.5 MG tablet Take 0.5 mg by mouth 2 (two) times a week. Tuesday and Thursday    . Cholecalciferol (VITAMIN D3) 1000 UNITS CAPS Take 1,000 Units by mouth every other day.     . Fluticasone-Salmeterol (ADVAIR DISKUS) 100-50 MCG/DOSE AEPB Inhale 1 puff into the lungs daily.     . furosemide (LASIX) 20 MG tablet Take 20 mg by mouth daily as needed for fluid.     Marland Kitchen GLUCOSAMINE HCL PO Take 1 tablet by mouth daily.    . hydroxypropyl methylcellulose / hypromellose (ISOPTO TEARS / GONIOVISC) 2.5 % ophthalmic solution Place 1 drop into both eyes 3 (three) times daily as needed for dry eyes.    Marland Kitchen losartan-hydrochlorothiazide (HYZAAR) 100-25 MG tablet Take 1 tablet by mouth daily.     . metoprolol succinate (TOPROL-XL) 50 MG 24 hr tablet Take 57m twice a day by mouth 270 tablet 2  . Multiple Vitamin (MULITIVITAMIN WITH MINERALS) TABS Take 1 tablet by mouth daily with breakfast.     . potassium chloride SA (K-DUR,KLOR-CON) 20 MEQ tablet Take 1 tablet (20 mEq total) by mouth 2 (two) times daily. (Patient taking differently: Take 20 mEq by mouth daily as needed (with lasix for fluid retention.). ) 30 tablet 0  . rivaroxaban (XARELTO) 20 MG TABS tablet Take 1 tablet (20 mg total) by mouth daily with supper. 90 tablet 2  . SIMBRINZA 1-0.2 % SUSP Place 1 drop into both eyes 3 (three) times daily.     . simvastatin (ZOCOR) 20 MG tablet Take 20 mg by mouth at bedtime.     . TRAVATAN Z 0.004 % SOLN ophthalmic solution Place 1 drop into both eyes every evening.     . vitamin B-12 (CYANOCOBALAMIN) 500 MCG tablet Take 500 mcg by mouth daily.     No current facility-administered medications for this encounter.    Facility-Administered Medications Ordered in Other Encounters  Medication Dose Route Frequency Provider Last Rate Last Dose  . lidocaine (cardiac) 100 mg/523m(XYLOCAINE) 20 MG/ML injection 2%    Anesthesia Intra-op JaMontel ClockCRNA   60 mg at 06/02/17 1343    Allergies  Allergen  Reactions  . Trandolapril-Verapamil Hcl Er Swelling    Causes lips to swell ("Preston Fleetingis the name brand)  . Trandolapril-Verapamil Hcl Er     Social History   Socioeconomic History  . Marital status: Married    Spouse name: Not on file  . Number of children: Not on file  . Years of education: Not on file  . Highest education level: Not on file  Occupational History  . Not on file  Social Needs  . Financial resource strain: Not on file  . Food insecurity:    Worry: Not on file    Inability: Not on file  . Transportation needs:    Medical: Not on file    Non-medical: Not on file  Tobacco Use  . Smoking status: Never Smoker  . Smokeless tobacco: Never Used  Substance and Sexual Activity  . Alcohol use: No  . Drug use: No  . Sexual activity: Yes    Birth control/protection: Post-menopausal, Surgical  Lifestyle  . Physical activity:    Days per week: Not on file    Minutes per session: Not on file  . Stress: Not on file  Relationships  . Social connections:    Talks on phone: Not on file    Gets together: Not on file    Attends religious service: Not on file    Active member of club or organization: Not on file    Attends meetings of clubs or organizations: Not on file    Relationship status: Not on file  . Intimate partner violence:    Fear of current or ex partner: Not on file    Emotionally abused: Not on file    Physically abused: Not on file    Forced sexual activity: Not on file  Other Topics Concern  . Not on file  Social History Narrative  . Not on file    Family History  Problem Relation Age of Onset  . Cancer Father 46       prostate ca  . Arthritis Mother   . Breast cancer Neg Hx    The patient does not have a history of early familial atrial fibrillation or other arrhythmias.  ROS- All systems are reviewed and negative except as per the HPI above.  Physical Exam: Vitals:   11/09/17 1037  BP: 122/76  Pulse: (!) 103  Weight: 282 lb (127.9  kg)  Height: _0  (1.575 m)    GEN- The patient is overweight appearing, alert and oriented x 3 today.   Head- normocephalic, atraumatic Eyes-  Sclera clear, conjunctiva pink Ears- hearing intact Oropharynx- clear Neck- supple  Lungs- Clear to ausculation bilaterally, normal work of breathing Heart- irregular rate and rhythm, no murmurs, rubs or gallops  GI- soft, NT, ND, + BS Extremities- no clubbing, cyanosis, or edema MS- no significant deformity or atrophy Skin- no rash or lesion Psych- euthymic mood, full affect Neuro- strength and sensation are intact  Wt Readings from Last 3 Encounters:  11/09/17 282 lb (127.9 kg)  10/07/17 282 lb (127.9 kg)  09/23/17 286 lb (129.7 kg)    EKG today demonstrates afib, V rate 103 bpm,  Echo 06/09/17 demonstrated Vigorous LV systolic function; mild LVH; mild LAE; mild AI/MR/TR;   mild RVE; mildly elevated pulmonary pressure.  Epic records are reviewed at length today  Assessment and Plan:  1. Persistent atrial fibrillation The patient is asymptomatic at this time.  Reports feeling "Better" with recently increased metoprolol.  The patients CHAD2VASC score is 3.  she is  appropriately anticoagulated at this time. I will increase Toprol XL again today from 73m BID to 765mBID  The patients left atrial size is 47 mm.  Additional echo findings include preserved EF. A long discussion with the patient was had today regarding therapeutic strategies.  Extensive discussion of lifestyle modification including begin progressive daily aerobic exercise program, attempt to lose weight, reduce salt in diet and cooking and improve dietary compliance was also discussed.  Presently, our recommendations include ongoing rate control.  Increase toprol.  Lifestyle modification  2. Morbid obesity As above, lifestyle modification was discussed at length including regular exercise and weight reduction.  3. Obstructive sleep apnea The importance of adequate  treatment of sleep  apnea was discussed today in order to improve our ability to maintain sinus rhythm long term.  4.  Chronic diastolic dysfunction Like due to dietary noncompliance, obesity, and AF. 2 gram sodium diet Daily weights with prn lasix Follow-up with Dr Irish Lack as scheduled  Return to AF clinic every 3 months   Thompson Grayer, MD 11/09/2017 11:25 AM

## 2017-11-10 ENCOUNTER — Encounter: Payer: Self-pay | Admitting: Pulmonary Disease

## 2017-11-10 ENCOUNTER — Ambulatory Visit (INDEPENDENT_AMBULATORY_CARE_PROVIDER_SITE_OTHER): Payer: Medicare Other | Admitting: Pulmonary Disease

## 2017-11-10 VITALS — BP 128/82 | HR 78 | Ht 62.0 in | Wt 279.0 lb

## 2017-11-10 DIAGNOSIS — I272 Pulmonary hypertension, unspecified: Secondary | ICD-10-CM | POA: Diagnosis not present

## 2017-11-10 DIAGNOSIS — Z9989 Dependence on other enabling machines and devices: Secondary | ICD-10-CM | POA: Diagnosis not present

## 2017-11-10 DIAGNOSIS — Z6841 Body Mass Index (BMI) 40.0 and over, adult: Secondary | ICD-10-CM

## 2017-11-10 DIAGNOSIS — G4733 Obstructive sleep apnea (adult) (pediatric): Secondary | ICD-10-CM

## 2017-11-10 NOTE — Progress Notes (Signed)
[facility]  Chief Complaint  Patient presents with  . Follow-up    Pt is doing well overall with her cpap machine.    Vital signs: BP 128/82 (BP Location: Left Arm, Cuff Size: Normal)   Pulse 78   Ht 5\' 2"  (1.575 m)   Wt 279 lb (126.6 kg)   SpO2 98%   BMI 51.03 kg/m   History of Present Illness: [name] is a 67 y.o. female with obstructive sleep apnea.  She uses CPAP nightly.  Pressure feels okay.  She has full face mask.  Feels mask is too small.  Had echo in January.  PA pressures elevated.  Denies chest pain, cough, wheeze, sputum, palpitations.  Still has leg swelling.  On xarelto for hx of A fib.  No hx of PE/DVT.  CXR from 04/30/17 showed cardiomegaly.   Physical Exam:  General - pleasant Eyes - pupils reactive ENT - no sinus tenderness, no oral exudate, no LAN Cardiac - regular, no murmur Chest - no wheeze, rales Abd - soft, non tender Ext - 1+ edema Skin - no rashes Neuro - normal strength Psych - normal mood  CBC Latest Ref Rng & Units 06/09/2017 06/02/2017 05/22/2017  WBC 4.0 - 10.5 K/uL 5.5 - 6.3  Hemoglobin 12.0 - 15.0 g/dL 12.3 12.9 11.0(L)  Hematocrit 36.0 - 46.0 % 38.5 38.0 34.9(L)  Platelets 150 - 400 K/uL 185 - 175    CMP Latest Ref Rng & Units 06/02/2017 05/22/2017 04/30/2017  Glucose 65 - 99 mg/dL 101(H) 96 111(H)  BUN 6 - 20 mg/dL 17 16 17   Creatinine 0.44 - 1.00 mg/dL 0.90 1.11(H) 1.15(H)  Sodium 135 - 145 mmol/L 141 138 138  Potassium 3.5 - 5.1 mmol/L 3.6 3.6 3.4(L)  Chloride 101 - 111 mmol/L 103 101 103  CO2 22 - 32 mmol/L - 27 26  Calcium 8.9 - 10.3 mg/dL - 9.1 9.5  Total Protein 6.4 - 8.3 g/dL - - -  Total Bilirubin 0.20 - 1.20 mg/dL - - -  Alkaline Phos 40 - 150 U/L - - -  AST 5 - 34 U/L - - -  ALT 0 - 55 U/L - - -    Lab Results  Component Value Date   TSH 4.681 (H) 04/30/2017     Assessment/Plan:  Obstructive sleep apnea. - she is compliant with CPAP and reports benefit -  continue auto CPAP - advised her to contact her DME to get CPAP mask refitted, and call if she needs an order for this  Pulmonary hypertension. - will arrange for ONO with CPAP - might need in lab titration study  Obesity. - discussed importance of weight loss   Patient Instructions  Will arrange for overnight oxygen test with CPAP  Follow up in 1 year    Chesley Mires, MD [place] 11/10/2017, 10:52 AM  Flow Sheet  Sleep tests: PSG 06/09/97 >> AHI 37 Auto CPAP 10/10/17 to 11/08/17 >> used on 29 of 30 nights with average 5 hrs.  Average AHI 1.2 with median CPAP 8 and 95 th percentile CPAP 10 cm H2O.  Air leak.  Cardiac tests Echo 06/09/17 >> EF 65 to 70%, mild AR, mild MR, PAS 42 mmHg  Past Medical History: She  has a past medical history of Arthritis, Asthma, Blood transfusion, Breast cancer ([place]), Cancer of lower-inner quadrant of female breast ([place]) (07/24/2011), Glaucoma, Heart murmur, History of chemotherapy, Hyperlipemia, Hypertension, Lymphedema, MRSA (methicillin resistant staph  aureus) culture positive, Neuromuscular disorder ([place]), OSA (obstructive sleep apnea), and Pituitary abnormality ([place]) (05-25-13).  Past Surgical History: She  has a past surgical history that includes Tubal ligation; Dilation and curettage of uterus; Carpal tunnel release (Right); Cholecystectomy (1987); Mastectomy modified radical (08/14/11); Abdominal hysterectomy (2007); Breast surgery; Portacath placement (09/11/2011); Port-a-cath removal (06/22/2012); Eye surgery; port-a-cath insertion; Colonoscopy (N/A, 06/13/2013); and Cardioversion (N/A, 06/02/2017).  Family History: Her family history includes Arthritis in her mother; Cancer (age of onset: 80) in her father.  Social History: She  reports that she has never smoked. She has never used smokeless tobacco. She reports that she does not drink alcohol or use drugs.  Medications: Allergies as of 11/10/2017      Reactions    Trandolapril-verapamil Hcl Er Swelling   Causes lips to swell [name]" is the name brand)   Trandolapril-verapamil Hcl Er       Medication List        Accurate as of 11/10/17 10:52 AM. Always use your most recent med list.          acetaminophen 325 MG tablet Commonly known as:  TYLENOL Take 325-650 mg by mouth every 6 (six) hours as needed (for pain.).   ADVAIR DISKUS 100-50 MCG/DOSE Aepb Generic drug:  Fluticasone-Salmeterol Inhale 1 puff into the lungs daily.   amLODipine 2.5 MG tablet Commonly known as:  NORVASC Take 2.5 mg by mouth daily.   anastrozole 1 MG tablet Commonly known as:  ARIMIDEX Take 1 tablet (1 mg total) by mouth daily.   cabergoline 0.5 MG tablet Commonly known as:  DOSTINEX Take 0.5 mg by mouth 2 (two) times a week. Tuesday and Thursday   furosemide 20 MG tablet Commonly known as:  LASIX Take 20 mg by mouth daily as needed for fluid.   GLUCOSAMINE HCL PO Take 1 tablet by mouth daily.   hydroxypropyl methylcellulose / hypromellose 2.5 % ophthalmic solution Commonly known as:  ISOPTO TEARS / GONIOVISC Place 1 drop into both eyes 3 (three) times daily as needed for dry eyes.   losartan-hydrochlorothiazide 100-25 MG tablet Commonly known as:  HYZAAR Take 1 tablet by mouth daily.   metoprolol succinate 50 MG 24 hr tablet Commonly known as:  TOPROL-XL Take 75mg  twice a day by mouth   multivitamin with minerals Tabs tablet Take 1 tablet by mouth daily with breakfast.   potassium chloride SA 20 MEQ tablet Commonly known as:  K-DUR,KLOR-CON Take 1 tablet (20 mEq total) by mouth 2 (two) times daily.   PROAIR HFA 108 (90 Base) MCG/ACT inhaler Generic drug:  albuterol Inhale 2 puffs into the lungs every 6 (six) hours as needed for wheezing or shortness of breath. For shortness of breath   rivaroxaban 20 MG Tabs tablet Commonly known as:  XARELTO Take 1 tablet (20 mg total) by mouth daily with supper.   SIMBRINZA 1-0.2 % Susp Generic drug:   Brinzolamide-Brimonidine Place 1 drop into both eyes 3 (three) times daily.   simvastatin 20 MG tablet Commonly known as:  ZOCOR Take 20 mg by mouth at bedtime.   TRAVATAN Z 0.004 % Soln ophthalmic solution Generic drug:  Travoprost (BAK Free) Place 1 drop into both eyes every evening.   vitamin B-12 500 MCG tablet Commonly known as:  CYANOCOBALAMIN Take 500 mcg by mouth daily.   Vitamin D3 1000 units Caps Take 1,000 Units by mouth every other day.

## 2017-11-10 NOTE — Patient Instructions (Signed)
Will arrange for overnight oxygen test with CPAP  Follow up in 1 year

## 2017-12-01 ENCOUNTER — Telehealth: Payer: Self-pay | Admitting: Pulmonary Disease

## 2017-12-01 NOTE — Telephone Encounter (Signed)
Called and spoke with patient regarding results.  Informed the patient of results and recommendations today. Pt verbalized understanding and denied any questions or concerns at this time.  Nothing further needed.  

## 2017-12-01 NOTE — Telephone Encounter (Signed)
ONO with CPAP 11/30/17 >> test time 6 hrs.  Baseline SpO2 95%, low SpO2 83%.  Spent 7 min with SpO2 < 88%.   Please let her know her oxygen test looked okay.  She should continue using CPAP at night with current settings.

## 2018-02-01 ENCOUNTER — Other Ambulatory Visit (HOSPITAL_COMMUNITY): Payer: Self-pay | Admitting: Nurse Practitioner

## 2018-02-09 ENCOUNTER — Encounter (HOSPITAL_COMMUNITY): Payer: Self-pay | Admitting: Nurse Practitioner

## 2018-02-09 ENCOUNTER — Ambulatory Visit (HOSPITAL_COMMUNITY)
Admission: RE | Admit: 2018-02-09 | Discharge: 2018-02-09 | Disposition: A | Discharge status: Home / Self Care | Payer: Medicare Other | Source: Ambulatory Visit | Attending: Nurse Practitioner | Admitting: Nurse Practitioner

## 2018-02-09 VITALS — BP 134/82 | HR 84 | Ht 62.0 in | Wt 282.0 lb

## 2018-02-09 DIAGNOSIS — I481 Persistent atrial fibrillation: Secondary | ICD-10-CM | POA: Diagnosis not present

## 2018-02-09 DIAGNOSIS — Z9049 Acquired absence of other specified parts of digestive tract: Secondary | ICD-10-CM | POA: Diagnosis not present

## 2018-02-09 DIAGNOSIS — Z7901 Long term (current) use of anticoagulants: Secondary | ICD-10-CM | POA: Insufficient documentation

## 2018-02-09 DIAGNOSIS — E785 Hyperlipidemia, unspecified: Secondary | ICD-10-CM | POA: Diagnosis not present

## 2018-02-09 DIAGNOSIS — Z79899 Other long term (current) drug therapy: Secondary | ICD-10-CM | POA: Insufficient documentation

## 2018-02-09 DIAGNOSIS — Z853 Personal history of malignant neoplasm of breast: Secondary | ICD-10-CM | POA: Insufficient documentation

## 2018-02-09 DIAGNOSIS — Z6841 Body Mass Index (BMI) 40.0 and over, adult: Secondary | ICD-10-CM | POA: Insufficient documentation

## 2018-02-09 DIAGNOSIS — I119 Hypertensive heart disease without heart failure: Secondary | ICD-10-CM | POA: Diagnosis not present

## 2018-02-09 DIAGNOSIS — I482 Chronic atrial fibrillation, unspecified: Secondary | ICD-10-CM

## 2018-02-09 DIAGNOSIS — G4733 Obstructive sleep apnea (adult) (pediatric): Secondary | ICD-10-CM | POA: Insufficient documentation

## 2018-02-09 DIAGNOSIS — H409 Unspecified glaucoma: Secondary | ICD-10-CM | POA: Diagnosis not present

## 2018-02-09 DIAGNOSIS — G629 Polyneuropathy, unspecified: Secondary | ICD-10-CM | POA: Diagnosis not present

## 2018-02-09 DIAGNOSIS — J45909 Unspecified asthma, uncomplicated: Secondary | ICD-10-CM | POA: Diagnosis not present

## 2018-02-09 DIAGNOSIS — I4891 Unspecified atrial fibrillation: Secondary | ICD-10-CM | POA: Diagnosis present

## 2018-02-09 NOTE — Progress Notes (Signed)
Primary Care Physician: Stephens Shire, MD Primary Cardiologist: Dr Irish Lack Primary Electrophysiologist: Dr Rayann Heman    [name] is a 67 y.o. female with a history of persistent atrial fibrillation who presents for follow-up in the [facility].  The patient has previously required cardioversion.  She is not typically symptomatic with her afib but has had some difficulty with fluid retention with RVR.   Recently,  metoprolol was increased for better rate control.No fluid issues..  She reports doing well at this time. Today, she denies symptoms of palpitations, chest pain, shortness of breath, orthopnea, PND, lower extremity edema, dizziness, presyncope, syncope, snoring, daytime somnolence, bleeding, or neurologic sequela. The patient is tolerating medications without difficulties and is otherwise without complaint today.    Atrial Fibrillation Risk Factors:  she does have symptoms or diagnosis of sleep apnea. she is compliant with CPAP therapy.  she does not have a history of rheumatic fever.  she does not have a history of alcohol use.  she has a BMI of Body mass index is 51.58 kg/m.Marland Kitchen Filed Weights   02/09/18 1104  Weight: 127.9 kg    LA size: 47   Atrial Fibrillation Management history:  Previous antiarrhythmic drugs: none  Previous cardioversions: yes, 06/02/17  Previous ablations: none  CHADS2VASC score: 3  Anticoagulation history: on xarelto   Past Medical History:  Diagnosis Date  . Arthritis   . Asthma   . Blood transfusion    hx of last one in 1987  . Breast cancer ([place])   . Cancer of lower-inner quadrant of female breast ([place]) 07/24/2011   right breast cancer /IDC,stage IIB,er/pr=+,her2=Neg  . Glaucoma   . Heart murmur   . History of chemotherapy    taxotere/cytoxan 6 cycles 09/22/11-01/05/12 day 2 neulasta  . Hyperlipemia   . Hypertension   . Lymphedema    right arm wears a sleeve  . MRSA (methicillin resistant  staph aureus) culture positive    08/2011  . Neuromuscular disorder ([place])    peripheral neuropathy hands feet  . OSA (obstructive sleep apnea)    CPAP settings- ?   . Pituitary abnormality ([place]) 05-25-13   small pituitary growth-appears stable- Dr. Christella Noa follows   Past Surgical History:  Procedure Laterality Date  . ABDOMINAL HYSTERECTOMY  2007   TAH/BSO  . BREAST SURGERY     mastectomy RIGHT  . CARDIOVERSION N/A 06/02/2017   Procedure: CARDIOVERSION;  Surgeon: Dorothy Spark, MD;  Location: [facility];  Service: Cardiovascular;  Laterality: N/A;  . CARPAL TUNNEL RELEASE Right    2012  . CHOLECYSTECTOMY  1987   open - Dr Lindon Romp  . COLONOSCOPY N/A 06/13/2013   Procedure: COLONOSCOPY;  Surgeon: Juanita Craver, MD;  [facility];  Service: Endoscopy;  Laterality: N/A;  . DILATION AND CURETTAGE OF UTERUS     x3  . EYE SURGERY     laser eye surgery for glaucoma  . MASTECTOMY MODIFIED RADICAL  08/14/11   right , ER/PR +, HER2 -  . port-a-cath insertion     port-a-cath removal 1'14  . PORT-A-CATH REMOVAL  06/22/2012   Procedure: MINOR REMOVAL PORT-A-CATH;  Surgeon: Haywood Lasso, MD;  Location: [facility];  Service: General;  Laterality: Left;  . PORTACATH PLACEMENT  09/11/2011   Procedure: INSERTION PORT-A-CATH;  Surgeon: Joyice Faster. Cornett, MD;  Location: [facility];  Service: General;  Laterality: N/A;  Insert of Port  . TUBAL LIGATION  Current Outpatient Medications  Medication Sig Dispense Refill  . acetaminophen (TYLENOL) 325 MG tablet Take 325-650 mg by mouth every 6 (six) hours as needed (for pain.).    Marland Kitchen albuterol (PROAIR HFA) 108 (90 Base) MCG/ACT inhaler Inhale 2 puffs into the lungs every 6 (six) hours as needed for wheezing or shortness of breath. For shortness of breath    . amLODipine (NORVASC) 2.5 MG tablet Take 2.5 mg by mouth daily.    Marland Kitchen anastrozole (ARIMIDEX) 1 MG tablet Take 1 tablet (1 mg total) by mouth daily. 90 tablet 3  .  cabergoline (DOSTINEX) 0.5 MG tablet Take 0.5 mg by mouth 2 (two) times a week. Tuesday and Thursday    . Cholecalciferol (VITAMIN D3) 1000 UNITS CAPS Take 1,000 Units by mouth every other day.     . Fluticasone-Salmeterol (ADVAIR DISKUS) 100-50 MCG/DOSE AEPB Inhale 1 puff into the lungs daily.     . furosemide (LASIX) 20 MG tablet Take 20 mg by mouth daily as needed for fluid.     Marland Kitchen GLUCOSAMINE HCL PO Take 1 tablet by mouth daily.    . hydroxypropyl methylcellulose / hypromellose (ISOPTO TEARS / GONIOVISC) 2.5 % ophthalmic solution Place 1 drop into both eyes 3 (three) times daily as needed for dry eyes.    Marland Kitchen losartan-hydrochlorothiazide (HYZAAR) 100-25 MG tablet Take 1 tablet by mouth daily.     . metoprolol succinate (TOPROL-XL) 50 MG 24 hr tablet Take 21m twice a day by mouth 270 tablet 2  . Multiple Vitamin (MULITIVITAMIN WITH MINERALS) TABS Take 1 tablet by mouth daily with breakfast.     . potassium chloride SA (K-DUR,KLOR-CON) 20 MEQ tablet Take 1 tablet (20 mEq total) by mouth 2 (two) times daily. (Patient taking differently: Take 20 mEq by mouth daily as needed (with lasix for fluid retention.). ) 30 tablet 0  . SIMBRINZA 1-0.2 % SUSP Place 1 drop into both eyes 3 (three) times daily.     . simvastatin (ZOCOR) 20 MG tablet Take 20 mg by mouth at bedtime.     . TRAVATAN Z 0.004 % SOLN ophthalmic solution Place 1 drop into both eyes every evening.     . vitamin B-12 (CYANOCOBALAMIN) 500 MCG tablet Take 500 mcg by mouth daily.    .Alveda Reasons20 MG TABS tablet TAKE 1 TABLET DAILY WITH SUPPER 90 tablet 4   No current facility-administered medications for this encounter.    Facility-Administered Medications Ordered in Other Encounters  Medication Dose Route Frequency Provider Last Rate Last Dose  . lidocaine (cardiac) 100 mg/566m(XYLOCAINE) 20 MG/ML injection 2%    Anesthesia Intra-op JaMontel ClockCRNA   60 mg at 06/02/17 1343    Allergies  Allergen Reactions  .  Trandolapril-Verapamil Hcl Er Swelling    Causes lips to swell ("Preston Fleetingis the name brand)  . Trandolapril-Verapamil Hcl Er     Social History   Socioeconomic History  . Marital status: Married    Spouse name: Not on file  . Number of children: Not on file  . Years of education: Not on file  . Highest education level: Not on file  Occupational History  . Not on file  Social Needs  . Financial resource strain: Not on file  . Food insecurity:    Worry: Not on file    Inability: Not on file  . Transportation needs:    Medical: Not on file    Non-medical: Not on file  Tobacco Use  . Smoking  status: Never Smoker  . Smokeless tobacco: Never Used  Substance and Sexual Activity  . Alcohol use: No  . Drug use: No  . Sexual activity: Yes    Birth control/protection: Post-menopausal, Surgical  Lifestyle  . Physical activity:    Days per week: Not on file    Minutes per session: Not on file  . Stress: Not on file  Relationships  . Social connections:    Talks on phone: Not on file    Gets together: Not on file    Attends religious service: Not on file    Active member of club or organization: Not on file    Attends meetings of clubs or organizations: Not on file    Relationship status: Not on file  . Intimate partner violence:    Fear of current or ex partner: Not on file    Emotionally abused: Not on file    Physically abused: Not on file    Forced sexual activity: Not on file  Other Topics Concern  . Not on file  Social History Narrative  . Not on file    Family History  Problem Relation Age of Onset  . Cancer Father 6       prostate ca  . Arthritis Mother   . Breast cancer Neg Hx    The patient does not have a history of early familial atrial fibrillation or other arrhythmias.  ROS- All systems are reviewed and negative except as per the HPI above.  Physical Exam: Vitals:   02/09/18 1104  BP: 134/82  Pulse: 84  SpO2: 98%  Weight: 127.9 kg  Height: 5'  2" (1.575 m)    GEN- The patient is overweight appearing, alert and oriented x 3 today.   Head- normocephalic, atraumatic Eyes-  Sclera clear, conjunctiva pink Ears- hearing intact Oropharynx- clear Neck- supple  Lungs- Clear to ausculation bilaterally, normal work of breathing Heart- irregular rate and rhythm, no murmurs, rubs or gallops  GI- soft, NT, ND, + BS Extremities- no clubbing, cyanosis, or edema MS- no significant deformity or atrophy Skin- no rash or lesion Psych- euthymic mood, full affect Neuro- strength and sensation are intact  Wt Readings from Last 3 Encounters:  02/09/18 127.9 kg  11/10/17 126.6 kg  11/09/17 127.9 kg    EKG today demonstrates afib, V rate 84 bpm,  Echo 06/09/17 demonstrated Vigorous LV systolic function; mild LVH; mild LAE; mild AI/MR/TR;   mild RVE; mildly elevated pulmonary pressure.  Epic records are reviewed at length today  Assessment and Plan:  1. Persistent atrial fibrillation The patient is asymptomatic at this time.  Reports feeling "Better" with recently increased metoprolol.  The patients CHAD2VASC score is 3.  she is  appropriately anticoagulated at this time. Continue Toprol XL  at 48m BID  The patients left atrial size is 47 mm.  Additional echo findings include preserved EF. A long discussion with the patient was had today regarding therapeutic strategies.  Extensive discussion of lifestyle modification including begin progressive daily aerobic exercise program, attempt to lose weight, reduce salt in diet and cooking and improve dietary compliance was also discussed.  Presently, our recommendations include ongoing rate control.  Lifestyle modification  2. Morbid obesity As above, lifestyle modification was discussed at length including regular exercise and weight reduction.  3. Obstructive sleep apnea The importance of adequate treatment of sleep apnea was discussed today in order to improve our ability to maintain sinus  rhythm long term.  4.  Chronic  diastolic dysfunction Like due to dietary noncompliance, obesity, and AF. 2 gram sodium diet Daily weights with prn lasix Follow-up with Dr Irish Lack as scheduled, April 2020 per recall   Return to AF clinic  05/2017   Roderic Palau, NP 02/09/2018 11:22 AM

## 2018-02-16 NOTE — Progress Notes (Signed)
[place]  Telephone:([phone]) [phone] Fax:([phone]) [phone]  OFFICE PROGRESS NOTE  Date of Service:  02/19/2018   PCP:  Stephens Shire, M.D. SU: Neldon Mc, M.D. RAD ONC: Arloa Koh, M.D.  DIAGNOSIS: [name] is a 67 y.o. 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  PRIOR TREATMENT HISTORY: #1 The patient was originally seen at the multidisciplinary breast clinic on 08/05/2011 for a stage IIB, invasive ductal carcinoma (T3 NX MX).   #2  Status post right simple mastectomy with right axillary lymph node resection on 08/14/2011 with the final pathology revealing invasive and in situ ductal carcinoma. Two nodules were seen 3 cm and 2.2 cm.  Margins were negative.  There was focal lymphovascular space involvement by tumor. 1/15 sentinel lymph nodes were positive for metastatic disease.  The tumor was estrogen receptor positive 100%,  progesterone receptor positive 100%,  and HER-2/neu negative-no amplification by CISH with a ratio of 1.09, proliferation marker 53%. Final pathologic staging was stage IIB, pT2 pN1a, pMX.    #3.  S/P Adjuvant chemotherapy with Taxotere/Cytoxan  Q3 weeks beginning on 09/22/2011.  The patient is a participant in NSBP B-49 protocol.  She completed total of 6 cycles of Taxotere/Cytoxan given from 09/22/2011 -  01/05/2012.  #4   Status post radiation therapy to the right chest wall and axilla completed on 03/31/2012.  #5  She began adjuvant antiestrogen therapy with Arimidex 1 mg by mouth daily in 03/2012.  A total of 10 years of therapy is planned.  #6  Bone density scan:  in 03/2012 showed a T score of 1.1 (normal).  #7  Bone density scan: in 08/03/2014 showed a T score of -1.2 (low bone mass)  #8  Left breast mammogram: on 08/01/2016 showed no evidence of malignancy.   #9. Bone Density Scan: in 02/23/17 showed normal density with T-score of -0.9 at Right  Femur neck.   #10. Left breast mammogram: on 08/19/17 showed no evidence of malignancy.    CURRENT THERAPY: Arimidex 1 mg daily started in 03/2012  INTERVAL HISTORY:   [name] 67 y.o. female returns for follow up for her right breast cancer. She was last seen by me 1 year ago. In interim she had her DEXA scan and screening mammogram, both normal.  She presents to the clinic today by herself.  She notes she recently discussed she has Atrial Fibrillation and underwent cardiac shock therapy. She is currently on Xarelto for this.  She notes she is tolerating Letrozole well. She would like a prescription for her compression sleeve and bra prosthesis.   On review of symptoms, pt notes she has joint pain in right knee. She ambulates with cane and gets injections for the pain. She notes to still having right arm lymphedema and numbness of right breast post surgery.     MEDICAL HISTORY: Past Medical History:  Diagnosis Date  . Arthritis   . Asthma   . Blood transfusion    hx of last one in 1987  . Breast cancer ([place])   . Cancer of lower-inner quadrant of female breast ([place]) 07/24/2011   right breast cancer /IDC,stage IIB,er/pr=+,her2=Neg  . Glaucoma   . Heart murmur   . History of chemotherapy    taxotere/cytoxan 6 cycles 09/22/11-01/05/12 day 2 neulasta  . Hyperlipemia   . Hypertension   . Lymphedema    right arm wears a sleeve  . MRSA (  methicillin resistant staph aureus) culture positive    08/2011  . Neuromuscular disorder ([place])    peripheral neuropathy hands feet  . OSA (obstructive sleep apnea)    CPAP settings- ?   . Pituitary abnormality ([place]) 05-25-13   small pituitary growth-appears stable- Dr. Christella Noa follows    ALLERGIES:  is allergic to trandolapril-verapamil hcl er and trandolapril-verapamil hcl er.   SURGICAL HISTORY:  Past Surgical History:  Procedure Laterality Date  . ABDOMINAL HYSTERECTOMY  2007   TAH/BSO  . BREAST SURGERY     mastectomy RIGHT   . CARDIOVERSION N/A 06/02/2017   Procedure: CARDIOVERSION;  Surgeon: Dorothy Spark, MD;  Location: [facility];  Service: Cardiovascular;  Laterality: N/A;  . CARPAL TUNNEL RELEASE Right    2012  . CHOLECYSTECTOMY  1987   open - Dr Lindon Romp  . COLONOSCOPY N/A 06/13/2013   Procedure: COLONOSCOPY;  Surgeon: Juanita Craver, MD;  [facility];  Service: Endoscopy;  Laterality: N/A;  . DILATION AND CURETTAGE OF UTERUS     x3  . EYE SURGERY     laser eye surgery for glaucoma  . MASTECTOMY MODIFIED RADICAL  08/14/11   right , ER/PR +, HER2 -  . port-a-cath insertion     port-a-cath removal 1'14  . PORT-A-CATH REMOVAL  06/22/2012   Procedure: MINOR REMOVAL PORT-A-CATH;  Surgeon: Haywood Lasso, MD;  Location: [facility];  Service: General;  Laterality: Left;  . PORTACATH PLACEMENT  09/11/2011   Procedure: INSERTION PORT-A-CATH;  Surgeon: Joyice Faster. Cornett, MD;  [facility];  Service: General;  Laterality: N/A;  Insert of Port  . TUBAL LIGATION      REVIEW OF SYSTEMS:   A 10 point review of systems was conducted and is otherwise negative except for what is noted above in HPI  Health Maintenance  Mammogram: 07/31/2015 normal  Colonoscopy: 05/2013 with Dr. Collene Mares, f/u in 5 years was recommended Bone Density Scan: 08/03/2014 Pap Smear: due in 01/2014, done by Dr. Leonie Douglas Exam: 01/18/2014 Vitamin D Level: followed by Dr. Rica Mote Lipid Panel: 05/2013, on Zocor, followed by PCP   PHYSICAL EXAMINATION:  BP (!) 142/106 (BP Location: Left Wrist, Patient Position: Sitting) Comment: told rn   Pulse (!) 101   Temp 97.7 F (36.5 C) (Oral)   Resp 20   Ht _0  (1.575 m)   Wt 279 lb 4.8 oz (126.7 kg)   SpO2 100%   BMI 51.08 kg/m    GENERAL: Patient is a well appearing female in no acute distress HEENT:  Sclerae anicteric.  Oropharynx clear and moist. No ulcerations or evidence of oropharyngeal candidiasis. Neck is supple.  NODES:  No cervical, supraclavicular, or  axillary lymphadenopathy palpated.   LUNGS:  Clear to auscultation bilaterally.  No wheezes or rhonchi. HEART:  Regular rate and rhythm. No murmur appreciated. ABDOMEN:  Soft, nontender.  Positive, normoactive bowel sounds. No organomegaly palpated. MSK:  No focal spinal tenderness to palpation. Full range of motion bilaterally in the upper extremities. EXTREMITIES:  (+) Right arm with lymphedema  (+) Lower extremity edema below the knees, more so of the right leg. SKIN:  Clear with no obvious rashes or skin changes. No nail dyscrasia. NEURO:  Nonfocal. Well oriented.  Appropriate affect. BREAST EXAM:  S/p right mastectomy with 0.5 cm firm nodule at lateral end of right mastectomy incision, likely palpable mild scar tissue, no tenderness at the right rib cage. left breast without masses or nodules, axillary no palpable nodes  ECOG PERFORMANCE STATUS: 1 - Symptomatic but completely ambulatory  LABORATORY DATA: CBC Latest Ref Rng & Units 02/19/2018 06/09/2017 06/02/2017  WBC 3.9 - 10.3 K/uL 5.6 5.5 -  Hemoglobin 11.6 - 15.9 g/dL 14.0 12.3 12.9  Hematocrit 34.8 - 46.6 % 43.0 38.5 38.0  Platelets 145 - 400 K/uL 166 185 -   CMP Latest Ref Rng & Units 02/19/2018 06/02/2017 05/22/2017  Glucose 70 - 99 mg/dL 95 101(H) 96  BUN 8 - 23 mg/dL _0 Creatinine 0.44 - 1.00 mg/dL 1.03(H) 0.90 1.11(H)  Sodium 135 - 145 mmol/L 143 141 138  Potassium 3.5 - 5.1 mmol/L 3.8 3.6 3.6  Chloride 98 - 111 mmol/L 102 103 101  CO2 22 - 32 mmol/L 31 - 27  Calcium 8.9 - 10.3 mg/dL 9.9 - 9.1  Total Protein 6.5 - 8.1 g/dL 7.4 - -  Total Bilirubin 0.3 - 1.2 mg/dL 2.7(H) - -  Alkaline Phos 38 - 126 U/L 81 - -  AST 15 - 41 U/L 15 - -  ALT 0 - 44 U/L 15 - -     Results for RAIN, WILHIDE (MRN [id number]) as of 08/12/2016 09:12  Ref. Range 06/21/2012 09:36 08/14/2015 09:00  Vitamin D, 25-Hydroxy Latest Ref Range: 30.0 - 100.0 ng/mL 45 50.2    RADIOLOGY:   3D Screening Mammogram of left breast  08/19/17 IMPRESSION: No mammographic evidence of malignancy.   Bone Density Scan 02/23/17 ASSESSMENT: The BMD measured at Femur Neck Right is 0.914 g/cm2 with a T-score of -0.9. This patient is considered normal according to [facility]) criteria. There has been a statistically significant decrease in BMD of Left hip since prior exam dated 04/21/2012. Lumbar spine was not utilized due to being excluded in prior exam. Patient chart shows that patient had a DEXA on 08/03/2014 and only a forearm was performed. Site Region Measured Date Measured Age YA BMD Significant CHANGE T-score DualFemur Neck Right 02/23/2017 68.1 -0.9 0.914 g/cm2 Left Forearm Radius 33% 02/23/2017 68.1 -0.2 0.870 g/cm2   MAMMOGRAM LEFT BREAST 08/01/2016 IMPRESSION: No evidence of malignancy. Stable benign mass within the lower left breast, presumed intramammary lymph node.  Mammogram 07/31/2015  FINDINGS: CC and MLO views the left breast with tomosynthesis were obtained. The circumscribed oval approximate 6 mm mass in the lower left breast, likely with a fatty hilus on the CC tomosynthesis images, is unchanged dating back to March, 2016. No new or suspicious findings elsewhere in the left breast. Mammographic images were processed with CAD. IMPRESSION: Stable likely benign 6 mm intramammary lymph node in the lower left breast (without sonographic correlate) dating back to March, 2016. RECOMMENDATION: Diagnostic left mammogram in 1 year in order to confirm 2 years of stability.  Bone density scan 07/30/2014 FINDINGS: LEFT FOREARM (1/3 RADIUS) Bone Mineral Density (BMD): 0.623 Young Adult T Score: -1.2 Z Score: 0.5 ASSESSMENT: Patient's diagnostic category is LOW BONE MASS by WHO Criteria.  ASSESSMENT: [name] is a 67 y.o. female:  1. Right breast IDA, stage IIB, pT2 pN1aM0, estrogen receptor positive 100%,  progesterone receptor positive 100%,  and HER-2/neu negative -She has  undergone the bulk of her treatment as noted above in prior treatment history. -She started Anastrozole in 03/2012. She will continue Anastrozole as she is tolerating well. We previously discussed the duration of adjuvant Anastrozole, new clinical data supports less recurrence with extended 10 years of AI than 5 years, but no significant difference between 7 and 10 years of AI.  If she continues to tolerate it well, we'll plan for 7 years Arimidex (until 2020), given her stage IIB disease. -We previously discussed the breast cancer surveillance after her surgery. She will continue annual screening mammogram, self exam, and a routine office visit with lab and exam with [name]. -I previously encouraged her to continue healthy diet, exercise regularly, and take calcium and vitamin D for bone health, and try to lose some weight. -She is also being followed u on the study of NSABP B49. -She is clinically doing well. Lab reviewed, her CBC and CMP are within normal limits except Cr at 1.03 and total bili at 2.7. Her physical exam and her 07/2017 mammogram were unremarkable except with right arm lymphedema and likely scar tissue of left breast incision. There is no clinical concern for recurrence. -Next mammogram 07/2018 -Continue Anastrozole daily, refilled today  -She has received her flu shot already for this year.  -F/u in 1 year  2. Bone Health  -Her bone density scan from March 2016 showed osteopenia, she had normal bone density in 2013 -We previously discussed that Arimidex may weaken her bone.  -I previously encouraged her to take calcium 1g and vitamin D 1000 units daily -Her vitamin D level was checked in March 2017 and was normal. -Her 02/2017 DEXA showed normal density with lowest T-Score at right femur neck at -0.9.  -Continue Calcium and VitD Supplements daily.  -Next DEXA in 02/2019   -3. Mild bilirubinemia -She has intermittent mild elevation of bilirubin, overall stable, possibly related to  medications. -We'll continue monitoring -total bili at 2.7 today (02/19/18), liver enzymes are within normal limits, she is asymptomatic, no hepatomegaly or tenderness.  I will recommend patient to stop simvastatin and hold anastrozole for now, get a abdominal ultrasound to evaluate his liver and spleen, and repeat lab in one month.   4. HTN, Afib and obesity  -She'll continue follow-up with her primary care physician -I previously encouraged her to continue healthy diet, exercise and try to lose some weight -In the past year she has developed Afib and underwent cardiac shock therapy. She has been placed on Xarelto by Cardiologist.  -BP at 142/106 today, weight stable. (02/19/18)  Plan -Second Petra Kuba Lymphedema sleeve and bra prosthesis prescriptions were given   -Due to her worsening hyperbilirubinemia, I will hold simvastatin and anastrozole for 1 months, and obtain a abdominal ultrasound, repeat labs in 1 month.  Will restart anastrozole if bilirubin improves. -I encouraged her to follow-up with her primary care physician -Return to clinic in 12 months with lab.  -Next mammogram 07/2018, ordered today    Truitt Merle  02/19/2018   I, Joslyn Devon, am acting as scribe for Truitt Merle, MD.   I have reviewed the above documentation for accuracy and completeness, and I agree with the above.

## 2018-02-19 ENCOUNTER — Inpatient Hospital Stay: Payer: Medicare Other | Attending: Hematology | Admitting: Hematology

## 2018-02-19 ENCOUNTER — Inpatient Hospital Stay: Payer: Medicare Other

## 2018-02-19 ENCOUNTER — Telehealth: Payer: Self-pay | Admitting: Hematology

## 2018-02-19 ENCOUNTER — Encounter: Payer: Self-pay | Admitting: Hematology

## 2018-02-19 ENCOUNTER — Telehealth: Payer: Self-pay | Admitting: Emergency Medicine

## 2018-02-19 VITALS — BP 142/106 | HR 101 | Temp 97.7°F | Resp 20 | Ht 62.0 in | Wt 279.3 lb

## 2018-02-19 DIAGNOSIS — Z9011 Acquired absence of right breast and nipple: Secondary | ICD-10-CM | POA: Insufficient documentation

## 2018-02-19 DIAGNOSIS — C50311 Malignant neoplasm of lower-inner quadrant of right female breast: Secondary | ICD-10-CM

## 2018-02-19 DIAGNOSIS — I10 Essential (primary) hypertension: Secondary | ICD-10-CM | POA: Insufficient documentation

## 2018-02-19 DIAGNOSIS — E669 Obesity, unspecified: Secondary | ICD-10-CM | POA: Insufficient documentation

## 2018-02-19 DIAGNOSIS — I4891 Unspecified atrial fibrillation: Secondary | ICD-10-CM | POA: Diagnosis not present

## 2018-02-19 DIAGNOSIS — Z79811 Long term (current) use of aromatase inhibitors: Secondary | ICD-10-CM | POA: Diagnosis not present

## 2018-02-19 DIAGNOSIS — Z170 Estrogen receptor positive status [ER+]: Principal | ICD-10-CM

## 2018-02-19 DIAGNOSIS — Z1231 Encounter for screening mammogram for malignant neoplasm of breast: Secondary | ICD-10-CM

## 2018-02-19 DIAGNOSIS — Z923 Personal history of irradiation: Secondary | ICD-10-CM | POA: Insufficient documentation

## 2018-02-19 DIAGNOSIS — Z23 Encounter for immunization: Secondary | ICD-10-CM | POA: Insufficient documentation

## 2018-02-19 DIAGNOSIS — M8580 Other specified disorders of bone density and structure, unspecified site: Secondary | ICD-10-CM | POA: Insufficient documentation

## 2018-02-19 DIAGNOSIS — C50911 Malignant neoplasm of unspecified site of right female breast: Secondary | ICD-10-CM | POA: Diagnosis present

## 2018-02-19 LAB — CBC WITH DIFFERENTIAL/PLATELET
BASOS ABS: 0.1 10*3/uL (ref 0.0–0.1)
BASOS PCT: 1 %
Eosinophils Absolute: 0.1 10*3/uL (ref 0.0–0.5)
Eosinophils Relative: 3 %
HCT: 43 % (ref 34.8–46.6)
Hemoglobin: 14 g/dL (ref 11.6–15.9)
Lymphocytes Relative: 46 %
Lymphs Abs: 2.6 10*3/uL (ref 0.9–3.3)
MCH: 29.8 pg (ref 25.1–34.0)
MCHC: 32.5 g/dL (ref 31.5–36.0)
MCV: 91.4 fL (ref 79.5–101.0)
Monocytes Absolute: 0.5 10*3/uL (ref 0.1–0.9)
Monocytes Relative: 8 %
NEUTROS ABS: 2.4 10*3/uL (ref 1.5–6.5)
Neutrophils Relative %: 42 %
Platelets: 166 10*3/uL (ref 145–400)
RBC: 4.7 MIL/uL (ref 3.70–5.45)
RDW: 15.2 % — ABNORMAL HIGH (ref 11.2–14.5)
WBC: 5.6 10*3/uL (ref 3.9–10.3)

## 2018-02-19 LAB — COMPREHENSIVE METABOLIC PANEL
ALK PHOS: 81 U/L (ref 38–126)
ALT: 15 U/L (ref 0–44)
ANION GAP: 10 (ref 5–15)
AST: 15 U/L (ref 15–41)
Albumin: 4.2 g/dL (ref 3.5–5.0)
BILIRUBIN TOTAL: 2.7 mg/dL — AB (ref 0.3–1.2)
BUN: 17 mg/dL (ref 8–23)
CALCIUM: 9.9 mg/dL (ref 8.9–10.3)
CO2: 31 mmol/L (ref 22–32)
Chloride: 102 mmol/L (ref 98–111)
Creatinine, Ser: 1.03 mg/dL — ABNORMAL HIGH (ref 0.44–1.00)
GFR, EST NON AFRICAN AMERICAN: 54 mL/min — AB (ref >60)
Glucose, Bld: 95 mg/dL (ref 70–99)
POTASSIUM: 3.8 mmol/L (ref 3.5–5.1)
Sodium: 143 mmol/L (ref 135–145)
TOTAL PROTEIN: 7.4 g/dL (ref 6.5–8.1)

## 2018-02-19 MED ORDER — INFLUENZA VAC SPLIT QUAD 0.5 ML IM SUSY
PREFILLED_SYRINGE | INTRAMUSCULAR | Status: AC
  Filled 2018-02-19: qty 0.5

## 2018-02-19 MED ORDER — ANASTROZOLE 1 MG PO TABS: 1.0000 mg | ORAL_TABLET | Freq: Every day | ORAL | 3 refills | Status: DC

## 2018-02-19 MED ORDER — INFLUENZA VAC SPLIT QUAD 0.5 ML IM SUSY
0.5000 mL | PREFILLED_SYRINGE | Freq: Once | INTRAMUSCULAR | Status: AC
  Administered 2018-02-19: 0.5 mL via INTRAMUSCULAR

## 2018-02-19 NOTE — Telephone Encounter (Signed)
No los 9/27

## 2018-02-19 NOTE — Telephone Encounter (Addendum)
VM left for pt to call back regarding this note   ----- Message ----- From: Truitt Merle, MD Sent: 02/19/2018  12:39 PM EDT To: Jonnie Finner, RN  Please let patient know that her total bilirubin went up to 2.7 today, worse than before, but other liver markers are normal.  I recommend her to stop simvastatin and hold anastrozole for a month, and repeat labs in a month.  I will probably restart anastrozole if her total bilirubin improves on next lab.  I also ordered a ultrasound of abdomen to evaluate her liver and spleen, to be done in the next few weeks.  Please asked patient to call us after her ultrasound and lab.  Truitt Merle  02/19/2018

## 2018-02-19 NOTE — Telephone Encounter (Signed)
Appts scheduled per 9/27 sch message :  Provider: Truitt Merle, MD  Appointment Notes:  please schedule her abd [name] in 1-2 weeks and lab in one month, thanks    [facility] to conatct patient with [name] and reminder letter sent in  The mail for lab

## 2018-03-03 ENCOUNTER — Telehealth: Payer: Self-pay | Admitting: Emergency Medicine

## 2018-03-03 NOTE — Telephone Encounter (Addendum)
Patient verbalized understanding of this note. Ultra sound scheduled and lab appt moved to 1 month from today. Pt knows to stop anastrozole and simvastatin.  ----- Message from Jonnie Finner, RN sent at 02/19/2018  1:45 PM EDT -----   ----- Message ----- From: Truitt Merle, MD Sent: 02/19/2018  12:39 PM EDT To: Jonnie Finner, RN  Please let patient know that her total bilirubin went up to 2.7 today, worse than before, but other liver markers are normal.  I recommend her to stop simvastatin and hold anastrozole for a month, and repeat labs in a month.  I will probably restart anastrozole if her total bilirubin improves on next lab.  I also ordered a ultrasound of abdomen to evaluate her liver and spleen, to be done in the next few weeks.  Please asked patient to call us after her ultrasound and lab.  Truitt Merle  02/19/2018

## 2018-03-05 ENCOUNTER — Ambulatory Visit (HOSPITAL_COMMUNITY)
Admission: RE | Admit: 2018-03-05 | Discharge: 2018-03-05 | Disposition: A | Discharge status: Home / Self Care | Payer: Medicare Other | Source: Ambulatory Visit | Attending: Hematology | Admitting: Hematology

## 2018-03-05 DIAGNOSIS — I77811 Abdominal aortic ectasia: Secondary | ICD-10-CM | POA: Insufficient documentation

## 2018-03-05 DIAGNOSIS — N271 Small kidney, bilateral: Secondary | ICD-10-CM | POA: Diagnosis not present

## 2018-03-05 DIAGNOSIS — Z9049 Acquired absence of other specified parts of digestive tract: Secondary | ICD-10-CM | POA: Insufficient documentation

## 2018-03-08 ENCOUNTER — Telehealth: Payer: Self-pay

## 2018-03-08 ENCOUNTER — Telehealth: Payer: Self-pay | Admitting: Hematology

## 2018-03-08 NOTE — Telephone Encounter (Signed)
Spoke with patient to let her know [name] results, informed her per Cira Rue NP will change lab appointment to one day next week, then depending on what the results are will tell us if we need to order a MRI of the liver.  Patient verbalized an understanding and requests either Monday or Tuesday of next week, scheduling message was sent.

## 2018-03-08 NOTE — Telephone Encounter (Signed)
Appt r/s and patient was notified per 10/14 sch msg

## 2018-03-08 NOTE — Telephone Encounter (Signed)
-----   Message from Truitt Merle, MD sent at 03/07/2018  2:03 PM EDT ----- Please let pt know the [name] results. Please move her lab from 11/4 to next week, and I will decide if she needs liver MRI based on the lab result. Ask her to call me after lab, thanks   Truitt Merle  03/07/2018

## 2018-03-15 ENCOUNTER — Inpatient Hospital Stay: Payer: Medicare Other | Attending: Hematology

## 2018-03-15 DIAGNOSIS — Z170 Estrogen receptor positive status [ER+]: Secondary | ICD-10-CM

## 2018-03-15 DIAGNOSIS — C50911 Malignant neoplasm of unspecified site of right female breast: Secondary | ICD-10-CM | POA: Insufficient documentation

## 2018-03-15 DIAGNOSIS — C50311 Malignant neoplasm of lower-inner quadrant of right female breast: Secondary | ICD-10-CM

## 2018-03-15 LAB — COMPREHENSIVE METABOLIC PANEL
ALT: 13 U/L (ref 0–44)
ANION GAP: 11 (ref 5–15)
AST: 12 U/L — ABNORMAL LOW (ref 15–41)
Albumin: 3.9 g/dL (ref 3.5–5.0)
Alkaline Phosphatase: 84 U/L (ref 38–126)
BUN: 15 mg/dL (ref 8–23)
CHLORIDE: 105 mmol/L (ref 98–111)
CO2: 27 mmol/L (ref 22–32)
Calcium: 9.8 mg/dL (ref 8.9–10.3)
Creatinine, Ser: 1 mg/dL (ref 0.44–1.00)
GFR, EST NON AFRICAN AMERICAN: 56 mL/min — AB (ref >60)
Glucose, Bld: 102 mg/dL — ABNORMAL HIGH (ref 70–99)
POTASSIUM: 3.7 mmol/L (ref 3.5–5.1)
Sodium: 143 mmol/L (ref 135–145)
Total Bilirubin: 2.4 mg/dL — ABNORMAL HIGH (ref 0.3–1.2)
Total Protein: 7.2 g/dL (ref 6.5–8.1)

## 2018-03-15 LAB — CBC WITH DIFFERENTIAL/PLATELET
Abs Immature Granulocytes: 0.02 10*3/uL (ref 0.00–0.07)
Basophils Absolute: 0 10*3/uL (ref 0.0–0.1)
Basophils Relative: 0 %
Eosinophils Absolute: 0.2 10*3/uL (ref 0.0–0.5)
Eosinophils Relative: 3 %
HCT: 43 % (ref 36.0–46.0)
Hemoglobin: 13.6 g/dL (ref 12.0–15.0)
IMMATURE GRANULOCYTES: 0 %
LYMPHS PCT: 41 %
Lymphs Abs: 2 10*3/uL (ref 0.7–4.0)
MCH: 29.2 pg (ref 26.0–34.0)
MCHC: 31.6 g/dL (ref 30.0–36.0)
MCV: 92.5 fL (ref 80.0–100.0)
MONO ABS: 0.5 10*3/uL (ref 0.1–1.0)
MONOS PCT: 10 %
NEUTROS ABS: 2.3 10*3/uL (ref 1.7–7.7)
NEUTROS PCT: 46 %
PLATELETS: 165 10*3/uL (ref 150–400)
RBC: 4.65 MIL/uL (ref 3.87–5.11)
RDW: 14.1 % (ref 11.5–15.5)
WBC: 4.9 10*3/uL (ref 4.0–10.5)
nRBC: 0 % (ref 0.0–0.2)

## 2018-03-22 ENCOUNTER — Other Ambulatory Visit: Payer: Medicare Other

## 2018-03-29 ENCOUNTER — Other Ambulatory Visit: Payer: Medicare Other

## 2018-04-15 ENCOUNTER — Other Ambulatory Visit: Payer: Medicare Other

## 2018-05-31 ENCOUNTER — Other Ambulatory Visit: Payer: Self-pay | Admitting: Neurosurgery

## 2018-05-31 DIAGNOSIS — D352 Benign neoplasm of pituitary gland: Secondary | ICD-10-CM

## 2018-06-15 ENCOUNTER — Ambulatory Visit
Admission: RE | Admit: 2018-06-15 | Discharge: 2018-06-15 | Disposition: A | Discharge status: Home / Self Care | Payer: Medicare Other | Source: Ambulatory Visit | Attending: Neurosurgery | Admitting: Neurosurgery

## 2018-06-15 DIAGNOSIS — D352 Benign neoplasm of pituitary gland: Secondary | ICD-10-CM

## 2018-06-15 MED ORDER — GADOBENATE DIMEGLUMINE 529 MG/ML IV SOLN
10.0000 mL | Freq: Once | INTRAVENOUS | Status: AC | PRN
  Administered 2018-06-15: 10 mL via INTRAVENOUS

## 2018-06-21 ENCOUNTER — Ambulatory Visit (HOSPITAL_COMMUNITY)
Admission: RE | Admit: 2018-06-21 | Discharge: 2018-06-21 | Disposition: A | Discharge status: Home / Self Care | Payer: Medicare Other | Source: Ambulatory Visit | Attending: Nurse Practitioner | Admitting: Nurse Practitioner

## 2018-06-21 ENCOUNTER — Encounter (HOSPITAL_COMMUNITY): Payer: Self-pay | Admitting: Nurse Practitioner

## 2018-06-21 VITALS — BP 134/84 | HR 76 | Ht 62.0 in | Wt 282.0 lb

## 2018-06-21 DIAGNOSIS — Z7901 Long term (current) use of anticoagulants: Secondary | ICD-10-CM | POA: Insufficient documentation

## 2018-06-21 DIAGNOSIS — J45909 Unspecified asthma, uncomplicated: Secondary | ICD-10-CM | POA: Insufficient documentation

## 2018-06-21 DIAGNOSIS — Z8261 Family history of arthritis: Secondary | ICD-10-CM | POA: Insufficient documentation

## 2018-06-21 DIAGNOSIS — Z79811 Long term (current) use of aromatase inhibitors: Secondary | ICD-10-CM | POA: Diagnosis not present

## 2018-06-21 DIAGNOSIS — Z8042 Family history of malignant neoplasm of prostate: Secondary | ICD-10-CM | POA: Diagnosis not present

## 2018-06-21 DIAGNOSIS — Z9011 Acquired absence of right breast and nipple: Secondary | ICD-10-CM | POA: Insufficient documentation

## 2018-06-21 DIAGNOSIS — E785 Hyperlipidemia, unspecified: Secondary | ICD-10-CM | POA: Insufficient documentation

## 2018-06-21 DIAGNOSIS — M1990 Unspecified osteoarthritis, unspecified site: Secondary | ICD-10-CM | POA: Insufficient documentation

## 2018-06-21 DIAGNOSIS — G4733 Obstructive sleep apnea (adult) (pediatric): Secondary | ICD-10-CM | POA: Diagnosis not present

## 2018-06-21 DIAGNOSIS — Z888 Allergy status to other drugs, medicaments and biological substances status: Secondary | ICD-10-CM | POA: Diagnosis not present

## 2018-06-21 DIAGNOSIS — Z9049 Acquired absence of other specified parts of digestive tract: Secondary | ICD-10-CM | POA: Diagnosis not present

## 2018-06-21 DIAGNOSIS — I4819 Other persistent atrial fibrillation: Secondary | ICD-10-CM | POA: Insufficient documentation

## 2018-06-21 DIAGNOSIS — C50311 Malignant neoplasm of lower-inner quadrant of right female breast: Secondary | ICD-10-CM | POA: Diagnosis not present

## 2018-06-21 DIAGNOSIS — I110 Hypertensive heart disease with heart failure: Secondary | ICD-10-CM | POA: Insufficient documentation

## 2018-06-21 DIAGNOSIS — I5032 Chronic diastolic (congestive) heart failure: Secondary | ICD-10-CM | POA: Diagnosis not present

## 2018-06-21 DIAGNOSIS — R9431 Abnormal electrocardiogram [ECG] [EKG]: Secondary | ICD-10-CM | POA: Diagnosis not present

## 2018-06-21 DIAGNOSIS — Z6841 Body Mass Index (BMI) 40.0 and over, adult: Secondary | ICD-10-CM | POA: Diagnosis not present

## 2018-06-21 DIAGNOSIS — I482 Chronic atrial fibrillation, unspecified: Secondary | ICD-10-CM

## 2018-06-21 DIAGNOSIS — H409 Unspecified glaucoma: Secondary | ICD-10-CM | POA: Insufficient documentation

## 2018-06-21 NOTE — Progress Notes (Signed)
Primary Care Physician: Stephens Shire, MD Primary Cardiologist: Dr Irish Lack Primary Electrophysiologist: Dr Rayann Heman    [name] is a 68 y.o. female with a history of  persistent atrial fibrillation who presents for follow-up in the [facility].  The patient has previously required cardioversion.  She is not typically symptomatic with her afib but previously had some difficulty with fluid retention with RVR.    Metoprolol was increased for better rate control.No fluid issues currently.  She reports doing well at this time. She is clear that she is not bothered with afib and wants to continue with rate controlled afib as her long term approach.  Today, she denies symptoms of palpitations, chest pain, shortness of breath, orthopnea, PND, lower extremity edema, dizziness, presyncope, syncope, snoring, daytime somnolence, bleeding, or neurologic sequela. The patient is tolerating medications without difficulties and is otherwise without complaint today.    Atrial Fibrillation Risk Factors:  she does have symptoms or diagnosis of sleep apnea. she is compliant with CPAP therapy.  she does not have a history of rheumatic fever.  she does not have a history of alcohol use.  she has a BMI of Body mass index is 51.58 kg/m.Marland Kitchen Filed Weights   06/21/18 1000  Weight: 127.9 kg    LA size: 47   Atrial Fibrillation Management history:  Previous antiarrhythmic drugs: none  Previous cardioversions: yes, 06/02/17  Previous ablations: none  CHADS2VASC score: 3  Anticoagulation history: on xarelto   Past Medical History:  Diagnosis Date  . Arthritis   . Asthma   . Blood transfusion    hx of last one in 1987  . Breast cancer ([place])   . Cancer of lower-inner quadrant of female breast ([place]) 07/24/2011   right breast cancer /IDC,stage IIB,er/pr=+,her2=Neg  . Glaucoma   . Heart murmur   . History of chemotherapy    taxotere/cytoxan 6 cycles  09/22/11-01/05/12 day 2 neulasta  . Hyperlipemia   . Hypertension   . Lymphedema    right arm wears a sleeve  . MRSA (methicillin resistant staph aureus) culture positive    08/2011  . Neuromuscular disorder ([place])    peripheral neuropathy hands feet  . OSA (obstructive sleep apnea)    CPAP settings- ?   . Pituitary abnormality ([place]) 05-25-13   small pituitary growth-appears stable- Dr. Christella Noa follows   Past Surgical History:  Procedure Laterality Date  . ABDOMINAL HYSTERECTOMY  2007   TAH/BSO  . BREAST SURGERY     mastectomy RIGHT  . CARDIOVERSION N/A 06/02/2017   Procedure: CARDIOVERSION;  Surgeon: Dorothy Spark, MD;  Location: [facility];  Service: Cardiovascular;  Laterality: N/A;  . CARPAL TUNNEL RELEASE Right    2012  . CHOLECYSTECTOMY  1987   open - Dr Lindon Romp  . COLONOSCOPY N/A 06/13/2013   Procedure: COLONOSCOPY;  Surgeon: Juanita Craver, MD;  [facility];  Service: Endoscopy;  Laterality: N/A;  . DILATION AND CURETTAGE OF UTERUS     x3  . EYE SURGERY     laser eye surgery for glaucoma  . MASTECTOMY MODIFIED RADICAL  08/14/11   right , ER/PR +, HER2 -  . port-a-cath insertion     port-a-cath removal 1'14  . PORT-A-CATH REMOVAL  06/22/2012   Procedure: MINOR REMOVAL PORT-A-CATH;  Surgeon: Haywood Lasso, MD;  Location: [facility];  Service: General;  Laterality: Left;  . PORTACATH PLACEMENT  09/11/2011   Procedure: INSERTION PORT-A-CATH;  Surgeon: Marcello Moores  Nydia Bouton, MD;  Location: [facility];  Service: General;  Laterality: N/A;  Insert of Port  . TUBAL LIGATION      Current Outpatient Medications  Medication Sig Dispense Refill  . acetaminophen (TYLENOL) 325 MG tablet Take 325-650 mg by mouth every 6 (six) hours as needed (for pain.).    Marland Kitchen albuterol (PROAIR HFA) 108 (90 Base) MCG/ACT inhaler Inhale 2 puffs into the lungs every 6 (six) hours as needed for wheezing or shortness of breath. For shortness of breath    . amLODipine (NORVASC) 2.5 MG  tablet Take 2.5 mg by mouth daily.    Marland Kitchen anastrozole (ARIMIDEX) 1 MG tablet Take 1 tablet (1 mg total) by mouth daily. 90 tablet 3  . cabergoline (DOSTINEX) 0.5 MG tablet Take 0.5 mg by mouth 2 (two) times a week. Tuesday and Thursday    . Cholecalciferol (VITAMIN D3) 1000 UNITS CAPS Take 1,000 Units by mouth every other day.     . Fluticasone-Salmeterol (ADVAIR DISKUS) 100-50 MCG/DOSE AEPB Inhale 1 puff into the lungs daily.     . furosemide (LASIX) 20 MG tablet Take 20 mg by mouth daily as needed for fluid.     Marland Kitchen GLUCOSAMINE HCL PO Take 1 tablet by mouth daily.    . hydroxypropyl methylcellulose / hypromellose (ISOPTO TEARS / GONIOVISC) 2.5 % ophthalmic solution Place 1 drop into both eyes 3 (three) times daily as needed for dry eyes.    Marland Kitchen losartan-hydrochlorothiazide (HYZAAR) 100-25 MG tablet Take 1 tablet by mouth daily.     . metoprolol succinate (TOPROL-XL) 50 MG 24 hr tablet Take 22m twice a day by mouth 270 tablet 2  . Multiple Vitamin (MULITIVITAMIN WITH MINERALS) TABS Take 1 tablet by mouth daily with breakfast.     . potassium chloride SA (K-DUR,KLOR-CON) 20 MEQ tablet Take 40 mEq by mouth as directed. Take when taking lasix    . SIMBRINZA 1-0.2 % SUSP Place 1 drop into both eyes 3 (three) times daily.     . simvastatin (ZOCOR) 20 MG tablet Take 20 mg by mouth at bedtime.     . TRAVATAN Z 0.004 % SOLN ophthalmic solution Place 1 drop into both eyes every evening.     . vitamin B-12 (CYANOCOBALAMIN) 500 MCG tablet Take 500 mcg by mouth daily.    .Alveda Reasons20 MG TABS tablet TAKE 1 TABLET DAILY WITH SUPPER 90 tablet 4   No current facility-administered medications for this encounter.    Facility-Administered Medications Ordered in Other Encounters  Medication Dose Route Frequency Provider Last Rate Last Dose  . lidocaine (cardiac) 100 mg/574m(XYLOCAINE) 20 MG/ML injection 2%    Anesthesia Intra-op JaMontel ClockCRNA   60 mg at 06/02/17 1343    Allergies  Allergen Reactions  .  Trandolapril-Verapamil Hcl Er Swelling    Causes lips to swell ("Preston Fleetingis the name brand)  . Trandolapril-Verapamil Hcl Er     Social History   Socioeconomic History  . Marital status: Married    Spouse name: Not on file  . Number of children: Not on file  . Years of education: Not on file  . Highest education level: Not on file  Occupational History  . Not on file  Social Needs  . Financial resource strain: Not on file  . Food insecurity:    Worry: Not on file    Inability: Not on file  . Transportation needs:    Medical: Not on file    Non-medical: Not on  file  Tobacco Use  . Smoking status: Never Smoker  . Smokeless tobacco: Never Used  Substance and Sexual Activity  . Alcohol use: No  . Drug use: No  . Sexual activity: Yes    Birth control/protection: Post-menopausal, Surgical  Lifestyle  . Physical activity:    Days per week: Not on file    Minutes per session: Not on file  . Stress: Not on file  Relationships  . Social connections:    Talks on phone: Not on file    Gets together: Not on file    Attends religious service: Not on file    Active member of club or organization: Not on file    Attends meetings of clubs or organizations: Not on file    Relationship status: Not on file  . Intimate partner violence:    Fear of current or ex partner: Not on file    Emotionally abused: Not on file    Physically abused: Not on file    Forced sexual activity: Not on file  Other Topics Concern  . Not on file  Social History Narrative  . Not on file    Family History  Problem Relation Age of Onset  . Cancer Father 70       prostate ca  . Arthritis Mother   . Breast cancer Neg Hx    The patient does not have a history of early familial atrial fibrillation or other arrhythmias.  ROS- All systems are reviewed and negative except as per the HPI above.  Physical Exam: Vitals:   06/21/18 1000  BP: 134/84  Pulse: 76  Weight: 127.9 kg  Height: 5' 2"  (1.575 m)      GEN- The patient is overweight appearing, alert and oriented x 3 today.   Head- normocephalic, atraumatic Eyes-  Sclera clear, conjunctiva pink Ears- hearing intact Oropharynx- clear Neck- supple  Lungs- Clear to ausculation bilaterally, normal work of breathing Heart- irregular rate and rhythm, no murmurs, rubs or gallops  GI- soft, NT, ND, + BS Extremities- no clubbing, cyanosis, or edema MS- no significant deformity or atrophy Skin- no rash or lesion Psych- euthymic mood, full affect Neuro- strength and sensation are intact  Wt Readings from Last 3 Encounters:  06/21/18 127.9 kg  02/19/18 126.7 kg  02/09/18 127.9 kg    EKG today demonstrates afib, V rate  76  bpm,  Echo 06/09/17 demonstrated Vigorous LV systolic function; mild LVH; mild LAE; mild AI/MR/TR; mild RVE; mildly elevated pulmonary pressure.  Epic records are reviewed at length today  Assessment and Plan:  1. Persistent atrial fibrillation The patient is asymptomatic at this time. She is well rate controlled with  metoprolol.  The patients CHAD2VASC score is 3.  she is  appropriately anticoagulated at this time. Continue Toprol XL  at 17m BID  The patients left atrial size is 47 mm.  Additional echo findings include preserved EF. A long discussion with the patient was had today regarding therapeutic strategies.  Extensive discussion of lifestyle modification including begin progressive daily aerobic exercise program, attempt to lose weight, reduce salt in diet and cooking and improve dietary compliance was also discussed.  Presently, our recommendations include ongoing rate control.  Lifestyle modification  2. Morbid obesity As above, lifestyle modification was discussed at length including regular exercise and weight reduction.  3. Obstructive sleep apnea The importance of adequate treatment of sleep apnea was discussed today in order to improve our ability to maintain sinus rhythm long term.  4.   Chronic diastolic dysfunction Like due to dietary noncompliance, obesity, and AF. 2 gram sodium diet Daily weights with prn lasix Follow-up with Dr Irish Lack as scheduled, April 2020 per recall   Return to AF clinic as needed   Roderic Palau, NP 06/21/2018 4:29 PM

## 2018-06-28 ENCOUNTER — Other Ambulatory Visit (HOSPITAL_COMMUNITY): Payer: Self-pay | Admitting: Internal Medicine

## 2018-07-22 ENCOUNTER — Other Ambulatory Visit: Payer: Self-pay | Admitting: Obstetrics and Gynecology

## 2018-07-22 DIAGNOSIS — Z1231 Encounter for screening mammogram for malignant neoplasm of breast: Secondary | ICD-10-CM

## 2018-08-20 ENCOUNTER — Ambulatory Visit: Payer: Medicare Other

## 2018-09-17 ENCOUNTER — Ambulatory Visit: Payer: Medicare Other

## 2018-10-11 ENCOUNTER — Telehealth: Payer: Self-pay

## 2018-10-11 NOTE — Progress Notes (Signed)
Virtual Visit via Telephone Note   This visit type was conducted due to national recommendations for restrictions regarding the COVID-19 Pandemic (e.g. social distancing) in an effort to limit this patient's exposure and mitigate transmission in our community.  Due to her co-morbid illnesses, this patient is at least at moderate risk for complications without adequate follow up.  This format is felt to be most appropriate for this patient at this time.  The patient did not have access to video technology/had technical difficulties with video requiring transitioning to audio format only (telephone).  All issues noted in this document were discussed and addressed.  No physical exam could be performed with this format.  Please refer to the patient's chart for her  consent to telehealth for [facility].   Date:  10/12/2018   ID:  [name], DOB August 08, 1950, MRN [id number]  Patient Location: Home Provider Location: Home  PCP:  Stephens Shire, MD  Cardiologist:  Larae Grooms, MD   Electrophysiologist:  Thompson Grayer, MD   Evaluation Performed:  Follow-Up Visit  Chief Complaint:  F/U  History of Present Illness:    STEPH [name] is a 68 y.o. female with history of atrial fibrillation status post DCCV 06/12/2017 but now persistent Afib, CHA2DS2-VASc equals 3 on Xarelto, last seen in the A. fib clinic 06/21/2018.  Also has chronic diastolic dysfunction, essential hypertension OSA on CPAP, obesity.  Patient last saw [name] ablation with general cardiology 08/2017.  Patient doing well. Denies chest pain, palpitations, dyspnea, dyspnea on exertion, dizziness or presyncope. Not exercising as much since she can't swim at the [facility]. She can walk 1/4 mile because of knee problems. Hasn't had a cortisone shot. Started taking Lasix daily because of fluid retention.  No bleeding problems on Xarelto.  The patient does not have symptoms concerning for COVID-19 infection (fever, chills,  cough, or new shortness of breath).    Past Medical History:  Diagnosis Date  . Arthritis   . Asthma   . Blood transfusion    hx of last one in 1987  . Breast cancer ([place])   . Cancer of lower-inner quadrant of female breast ([place]) 07/24/2011   right breast cancer /IDC,stage IIB,er/pr=+,her2=Neg  . Glaucoma   . Heart murmur   . History of chemotherapy    taxotere/cytoxan 6 cycles 09/22/11-01/05/12 day 2 neulasta  . Hyperlipemia   . Hypertension   . Lymphedema    right arm wears a sleeve  . MRSA (methicillin resistant staph aureus) culture positive    08/2011  . Neuromuscular disorder ([place])    peripheral neuropathy hands feet  . OSA (obstructive sleep apnea)    CPAP settings- ?   . Pituitary abnormality ([place]) 05-25-13   small pituitary growth-appears stable- Dr. Christella Noa follows   Past Surgical History:  Procedure Laterality Date  . ABDOMINAL HYSTERECTOMY  2007   TAH/BSO  . BREAST SURGERY     mastectomy RIGHT  . CARDIOVERSION N/A 06/02/2017   Procedure: CARDIOVERSION;  Surgeon: Dorothy Spark, MD;  Location: [facility];  Service: Cardiovascular;  Laterality: N/A;  . CARPAL TUNNEL RELEASE Right    2012  . CHOLECYSTECTOMY  1987   open - Dr Lindon Romp  . COLONOSCOPY N/A 06/13/2013   Procedure: COLONOSCOPY;  Surgeon: Juanita Craver, MD;  [facility];  Service: Endoscopy;  Laterality: N/A;  . DILATION AND CURETTAGE OF UTERUS     x3  . EYE SURGERY     laser eye surgery for glaucoma  .  MASTECTOMY MODIFIED RADICAL  08/14/11   right , ER/PR +, HER2 -  . port-a-cath insertion     port-a-cath removal 1'14  . PORT-A-CATH REMOVAL  06/22/2012   Procedure: MINOR REMOVAL PORT-A-CATH;  Surgeon: Haywood Lasso, MD;  Location: [facility];  Service: General;  Laterality: Left;  . PORTACATH PLACEMENT  09/11/2011   Procedure: INSERTION PORT-A-CATH;  Surgeon: Joyice Faster. Cornett, MD;  [facility];  Service: General;  Laterality: N/A;  Insert of Port  . TUBAL LIGATION        Current Meds  Medication Sig  . acetaminophen (TYLENOL) 325 MG tablet Take 325-650 mg by mouth every 6 (six) hours as needed (for pain.).  Marland Kitchen albuterol (PROAIR HFA) 108 (90 Base) MCG/ACT inhaler Inhale 2 puffs into the lungs every 6 (six) hours as needed for wheezing or shortness of breath. For shortness of breath  . amLODipine (NORVASC) 2.5 MG tablet Take 1 tablet (2.5 mg total) by mouth daily.  Marland Kitchen anastrozole (ARIMIDEX) 1 MG tablet Take 1 tablet (1 mg total) by mouth daily.  . cabergoline (DOSTINEX) 0.5 MG tablet Take 0.5 mg by mouth 2 (two) times a week. Tuesday and Thursday  . Cholecalciferol (VITAMIN D3) 1000 UNITS CAPS Take 1,000 Units by mouth every other day.   . furosemide (LASIX) 20 MG tablet Take 1 tablet (20 mg total) by mouth daily as needed for fluid.  Marland Kitchen GLUCOSAMINE HCL PO Take 1 tablet by mouth daily.  . hydroxypropyl methylcellulose / hypromellose (ISOPTO TEARS / GONIOVISC) 2.5 % ophthalmic solution Place 1 drop into both eyes 3 (three) times daily as needed for dry eyes.  Marland Kitchen losartan-hydrochlorothiazide (HYZAAR) 100-25 MG tablet Take 1 tablet by mouth daily.  . metoprolol succinate (TOPROL XL) 50 MG 24 hr tablet TAKE ONE AND ONE-HALF TABLETS (75 MG) TWICE A DAY ( DOSE CHANGE)  . Multiple Vitamin (MULITIVITAMIN WITH MINERALS) TABS Take 1 tablet by mouth daily with breakfast.   . potassium chloride SA (K-DUR) 20 MEQ tablet Take 2 tablets (40 mEq total) by mouth as directed. Take when taking lasix  . rivaroxaban (XARELTO) 20 MG TABS tablet TAKE 1 TABLET DAILY WITH SUPPER  . SIMBRINZA 1-0.2 % SUSP Place 1 drop into both eyes 3 (three) times daily.   . simvastatin (ZOCOR) 20 MG tablet Take 1 tablet (20 mg total) by mouth at bedtime.  . TRAVATAN Z 0.004 % SOLN ophthalmic solution Place 1 drop into both eyes every evening.   . vitamin B-12 (CYANOCOBALAMIN) 500 MCG tablet Take 500 mcg by mouth daily.  . [DISCONTINUED] amLODipine (NORVASC) 2.5 MG tablet Take 2.5 mg by mouth daily.   . [DISCONTINUED] furosemide (LASIX) 20 MG tablet Take 20 mg by mouth daily as needed for fluid.   . [DISCONTINUED] losartan-hydrochlorothiazide (HYZAAR) 100-25 MG tablet Take 1 tablet by mouth daily.   . [DISCONTINUED] metoprolol succinate (TOPROL XL) 50 MG 24 hr tablet TAKE ONE AND ONE-HALF TABLETS (75 MG) TWICE A DAY ( DOSE CHANGE)  . [DISCONTINUED] potassium chloride SA (K-DUR,KLOR-CON) 20 MEQ tablet Take 40 mEq by mouth as directed. Take when taking lasix  . [DISCONTINUED] simvastatin (ZOCOR) 20 MG tablet Take 20 mg by mouth at bedtime.   . [DISCONTINUED] XARELTO 20 MG TABS tablet TAKE 1 TABLET DAILY WITH SUPPER     Allergies:   Trandolapril-verapamil hcl er and Trandolapril-verapamil hcl er   Social History   Tobacco Use  . Smoking status: Never Smoker  . Smokeless tobacco: Never Used  Substance  Use Topics  . Alcohol use: No  . Drug use: No     Family Hx: The patient's family history includes Arthritis in her mother; Cancer (age of onset: 64) in her father. There is no history of Breast cancer.  ROS:   Please see the history of present illness.      All other systems reviewed and are negative.   Prior CV studies:   The following studies were reviewed today:  2D echo 1/2019Study Conclusions   - Left ventricle: The cavity size was normal. Wall thickness was   increased in a pattern of mild LVH. Systolic function was   vigorous. The estimated ejection fraction was in the range of 65%   to 70%. Wall motion was normal; there were no regional wall   motion abnormalities. Left ventricular diastolic function   parameters were normal. - Aortic valve: There was mild regurgitation. - Mitral valve: Calcified annulus. Mildly thickened leaflets .   There was mild regurgitation. - Left atrium: The atrium was mildly dilated. - Right ventricle: The cavity size was mildly dilated. - Pulmonary arteries: Systolic pressure was mildly increased. PA   peak pressure: 42 mm Hg (S).    Impressions:   - Vigorous LV systolic function; mild LVH; mild LAE; mild AI/MR/TR;   mild RVE; mildly elevated pulmonary pressure.     Labs/Other Tests and Data Reviewed:    EKG:  An ECG dated 06/21/18 was personally reviewed today and demonstrated:  A. fib at 76/m with TWI  Recent Labs: 03/15/2018: ALT 13; BUN 15; Creatinine, Ser 1.00; Hemoglobin 13.6; Platelets 165; Potassium 3.7; Sodium 143   Recent Lipid Panel No results found for: CHOL, TRIG, HDL, CHOLHDL, LDLCALC, LDLDIRECT  Wt Readings from Last 3 Encounters:  10/12/18 260 lb (117.9 kg)  06/21/18 282 lb (127.9 kg)  02/19/18 279 lb 4.8 oz (126.7 kg)     Objective:    Vital Signs:  BP 129/79   Pulse 84   Ht 5' 2" (1.575 m)   Wt 260 lb (117.9 kg)   BMI 47.55 kg/m    VITAL SIGNS:  reviewed  ASSESSMENT & PLAN:    1. Persistent atrial fibrillation CHA2DS2-VASc score equals 3 on Xarelto patient asymptomatic and maintained on Toprol-XL 75 mg twice daily. Doing well. Will have [facility] check surveillance labs. 2. Essential hypertension-BP well controlled 3. Morbid obesity-has lost 22 lbs since Jan-intentional 4. OSA on CPAP compliant  COVID-19 Education: The signs and symptoms of COVID-19 were discussed with the patient and how to seek care for testing (follow up with PCP or arrange E-visit).   The importance of social distancing was discussed today.  Time:   Today, I have spent 15:10 minutes with the patient with telehealth technology discussing the above problems.     Medication Adjustments/Labs and Tests Ordered: Current medicines are reviewed at length with the patient today.  Concerns regarding medicines are outlined above.   Tests Ordered: Orders Placed This Encounter  Procedures  . CBC  . Comprehensive metabolic panel  . Lipid panel    Medication Changes: Meds ordered this encounter  Medications  . amLODipine (NORVASC) 2.5 MG tablet    Sig: Take 1 tablet (2.5 mg total) by mouth daily.    Dispense:  90  tablet    Refill:  3  . furosemide (LASIX) 20 MG tablet    Sig: Take 1 tablet (20 mg total) by mouth daily as needed for fluid.    Dispense:  90 tablet  Refill:  3  . losartan-hydrochlorothiazide (HYZAAR) 100-25 MG tablet    Sig: Take 1 tablet by mouth daily.    Dispense:  90 tablet    Refill:  3  . metoprolol succinate (TOPROL XL) 50 MG 24 hr tablet    Sig: TAKE ONE AND ONE-HALF TABLETS (75 MG) TWICE A DAY ( DOSE CHANGE)    Dispense:  270 tablet    Refill:  3  . potassium chloride SA (K-DUR) 20 MEQ tablet    Sig: Take 2 tablets (40 mEq total) by mouth as directed. Take when taking lasix    Dispense:  90 tablet    Refill:  3  . simvastatin (ZOCOR) 20 MG tablet    Sig: Take 1 tablet (20 mg total) by mouth at bedtime.    Dispense:  90 tablet    Refill:  3  . rivaroxaban (XARELTO) 20 MG TABS tablet    Sig: TAKE 1 TABLET DAILY WITH SUPPER    Dispense:  90 tablet    Refill:  1    Disposition:  Follow up in 1 year(s) [name]  Signed, Ermalinda Barrios, PA-C  10/12/2018 8:14 AM    [place]

## 2018-10-11 NOTE — Telephone Encounter (Signed)
Virtual Visit Pre-Appointment Phone Call  TELEPHONE CALL NOTE  [name] has been deemed a candidate for a follow-up tele-health visit to limit community exposure during the Covid-19 pandemic. I spoke with the patient via phone to ensure availability of phone/video source, confirm preferred email & phone number, and discuss instructions and expectations.  I reminded [name] to be prepared with any vital sign and/or heart rhythm information that could potentially be obtained via home monitoring, at the time of her visit. I reminded [name] to expect a phone call prior to her visit.  Patient agrees to consent below.  Cleon Gustin, RN 10/11/2018 10:42 AM    FULL LENGTH CONSENT FOR TELE-HEALTH VISIT   I hereby voluntarily request, consent and authorize [facility] and its employed or contracted physicians, physician assistants, nurse practitioners or other licensed health care professionals (the Practitioner), to provide me with telemedicine health care services (the "Services") as deemed necessary by the treating Practitioner. I acknowledge and consent to receive the Services by the Practitioner via telemedicine. I understand that the telemedicine visit will involve communicating with the Practitioner through live audiovisual communication technology and the disclosure of certain medical information by electronic transmission. I acknowledge that I have been given the opportunity to request an in-person assessment or other available alternative prior to the telemedicine visit and am voluntarily participating in the telemedicine visit.  I understand that I have the right to withhold or withdraw my consent to the use of telemedicine in the course of my care at any time, without affecting my right to future care or treatment, and that the Practitioner or I may terminate the telemedicine visit at any time. I understand that I have the right to inspect all information  obtained and/or recorded in the course of the telemedicine visit and may receive copies of available information for a reasonable fee.  I understand that some of the potential risks of receiving the Services via telemedicine include:  Marland Kitchen Delay or interruption in medical evaluation due to technological equipment failure or disruption; . Information transmitted may not be sufficient (e.g. poor resolution of images) to allow for appropriate medical decision making by the Practitioner; and/or  . In rare instances, security protocols could fail, causing a breach of personal health information.  Furthermore, I acknowledge that it is my responsibility to provide information about my medical history, conditions and care that is complete and accurate to the best of my ability. I acknowledge that Practitioner's advice, recommendations, and/or decision may be based on factors not within their control, such as incomplete or inaccurate data provided by me or distortions of diagnostic images or specimens that may result from electronic transmissions. I understand that the practice of medicine is not an exact science and that Practitioner makes no warranties or guarantees regarding treatment outcomes. I acknowledge that I will receive a copy of this consent concurrently upon execution via email to the email address I last provided but may also request a printed copy by calling the office of [place].    I understand that my insurance will be billed for this visit.   I have read or had this consent read to me. . I understand the contents of this consent, which adequately explains the benefits and risks of the Services being provided via telemedicine.  . I have been provided ample opportunity to ask questions regarding this consent and the Services and have had my questions answered to my satisfaction. Marland Kitchen  I give my informed consent for the services to be provided through the use of telemedicine in my medical care   By participating in this telemedicine visit I agree to the above.

## 2018-10-12 ENCOUNTER — Other Ambulatory Visit: Payer: Self-pay

## 2018-10-12 ENCOUNTER — Telehealth (INDEPENDENT_AMBULATORY_CARE_PROVIDER_SITE_OTHER): Payer: Medicare Other | Admitting: Physician Assistant

## 2018-10-12 ENCOUNTER — Encounter: Payer: Self-pay | Admitting: Physician Assistant

## 2018-10-12 ENCOUNTER — Telehealth: Payer: Self-pay

## 2018-10-12 VITALS — BP 129/79 | HR 84 | Ht 62.0 in | Wt 260.0 lb

## 2018-10-12 DIAGNOSIS — I10 Essential (primary) hypertension: Secondary | ICD-10-CM

## 2018-10-12 DIAGNOSIS — I482 Chronic atrial fibrillation, unspecified: Secondary | ICD-10-CM

## 2018-10-12 DIAGNOSIS — G4733 Obstructive sleep apnea (adult) (pediatric): Secondary | ICD-10-CM

## 2018-10-12 MED ORDER — AMLODIPINE BESYLATE 2.5 MG PO TABS: 2.5000 mg | ORAL_TABLET | Freq: Every day | ORAL | 3 refills | Status: DC

## 2018-10-12 MED ORDER — METOPROLOL SUCCINATE ER 50 MG PO TB24: ORAL_TABLET | ORAL | 3 refills | Status: DC

## 2018-10-12 MED ORDER — POTASSIUM CHLORIDE CRYS ER 20 MEQ PO TBCR: 40.0000 meq | EXTENDED_RELEASE_TABLET | ORAL | 3 refills | Status: DC

## 2018-10-12 MED ORDER — SIMVASTATIN 20 MG PO TABS: 20.0000 mg | ORAL_TABLET | Freq: Every day | ORAL | 3 refills | Status: DC

## 2018-10-12 MED ORDER — RIVAROXABAN 20 MG PO TABS: ORAL_TABLET | ORAL | 1 refills | Status: DC

## 2018-10-12 MED ORDER — LOSARTAN POTASSIUM-HCTZ 100-25 MG PO TABS: 1.0000 | ORAL_TABLET | Freq: Every day | ORAL | 3 refills | Status: DC

## 2018-10-12 MED ORDER — FUROSEMIDE 20 MG PO TABS: 20.0000 mg | ORAL_TABLET | Freq: Every day | ORAL | 3 refills | Status: DC | PRN

## 2018-10-12 NOTE — Telephone Encounter (Signed)
[place] Visit Initial Request  Agency Requested:    [facility] Contact:  Glory Buff, NP [address] [place], [place] [address] Phone #:  [phone] Fax #:  [phone]  Patient Demographic Information: Name:  [name] Age:  68 y.o.   DOB:  Mar 19, 1951  MRN:  [id number]   Address:   [place] [address]   Phone Numbers:   Home Phone [phone]     Emergency Contact Information on File:   Contact Information    Name Relation Home Work Mobile   Sunny, [place] [phone]  [phone]      The above family members may be contacted for information on this patient (review DPR on file):  Yes    Patient Clinical Information:  Primary Care Provider:  Stephens Shire, MD  Primary Cardiologist:  Larae Grooms, MD  Primary Electrophysiologist:  Thompson Grayer, MD   Requesting Provider:  Ermalinda Barrios, PA    Past Medical Hx: [name]  has a past medical history of Arthritis, Asthma, Blood transfusion, Breast cancer [facility]), Cancer of lower-inner quadrant of female breast ([place]) (07/24/2011), Glaucoma, Heart murmur, History of chemotherapy, Hyperlipemia, Hypertension, Lymphedema, MRSA (methicillin resistant staph aureus) culture positive, Neuromuscular disorder ([place]), OSA (obstructive sleep apnea), and Pituitary abnormality ([place]) (05-25-13).   Allergies: She is allergic to trandolapril-verapamil hcl er and trandolapril-verapamil hcl er.   Medications: Current Outpatient Medications on File Prior to Visit  Medication Sig  . acetaminophen (TYLENOL) 325 MG tablet Take 325-650 mg by mouth every 6 (six) hours as needed (for pain.).  Marland Kitchen albuterol (PROAIR HFA) 108 (90 Base) MCG/ACT inhaler Inhale 2 puffs into the lungs every 6 (six) hours as needed for wheezing or shortness of breath. For shortness of breath  . amLODipine (NORVASC) 2.5 MG tablet Take 1 tablet (2.5 mg total) by mouth daily.  Marland Kitchen  anastrozole (ARIMIDEX) 1 MG tablet Take 1 tablet (1 mg total) by mouth daily.  . cabergoline (DOSTINEX) 0.5 MG tablet Take 0.5 mg by mouth 2 (two) times a week. Tuesday and Thursday  . Cholecalciferol (VITAMIN D3) 1000 UNITS CAPS Take 1,000 Units by mouth every other day.   . furosemide (LASIX) 20 MG tablet Take 1 tablet (20 mg total) by mouth daily as needed for fluid.  Marland Kitchen GLUCOSAMINE HCL PO Take 1 tablet by mouth daily.  . hydroxypropyl methylcellulose / hypromellose (ISOPTO TEARS / GONIOVISC) 2.5 % ophthalmic solution Place 1 drop into both eyes 3 (three) times daily as needed for dry eyes.  Marland Kitchen losartan-hydrochlorothiazide (HYZAAR) 100-25 MG tablet Take 1 tablet by mouth daily.  . metoprolol succinate (TOPROL XL) 50 MG 24 hr tablet TAKE ONE AND ONE-HALF TABLETS (75 MG) TWICE A DAY ( DOSE CHANGE)  . Multiple Vitamin (MULITIVITAMIN WITH MINERALS) TABS Take 1 tablet by mouth daily with breakfast.   . potassium chloride SA (K-DUR) 20 MEQ tablet Take 2 tablets (40 mEq total) by mouth as directed. Take when taking lasix  . rivaroxaban (XARELTO) 20 MG TABS tablet TAKE 1 TABLET DAILY WITH SUPPER  . SIMBRINZA 1-0.2 % SUSP Place 1 drop into both eyes 3 (three) times daily.   . simvastatin (ZOCOR) 20 MG tablet Take 1 tablet (20 mg total) by mouth at bedtime.  . TRAVATAN Z 0.004 % SOLN ophthalmic solution Place 1 drop into both eyes every evening.   . vitamin B-12 (CYANOCOBALAMIN) 500 MCG tablet Take 500 mcg by mouth daily.   Current Facility-Administered  Medications on File Prior to Visit  Medication  . lidocaine (cardiac) 100 mg/55ml (XYLOCAINE) 20 MG/ML injection 2%     Social Hx: She  reports that she has never smoked. She has never used smokeless tobacco. She reports that she does not drink alcohol or use drugs.    Diagnosis/Reason for Visit:   Afib, HTN, OSA, Obesity/Surveillance Labs  Services Requested:  Labs:  CBC, CMET, Fasting LIPIDS  # of Visits Needed/Frequency per Week: Once

## 2018-10-12 NOTE — Patient Instructions (Signed)
Medication Instructions:  Your physician recommends that you continue on your current medications as directed. Please refer to the Current Medication list given to you today.  If you need a refill on your cardiac medications before your next appointment, please call your pharmacy.   Lab work: We will have Home Health come out and draw FASTING labs: CMET, CBC, LIPIDS  If you have labs (blood work) drawn today and your tests are completely normal, you will receive your results only by: Marland Kitchen MyChart Message (if you have MyChart) OR . A paper copy in the mail If you have any lab test that is abnormal or we need to change your treatment, we will call you to review the results.  Testing/Procedures: None ordered  Follow-Up: At [facility], you and your health needs are our priority.  As part of our continuing mission to provide you with exceptional heart care, we have created designated Provider Care Teams.  These Care Teams include your primary Cardiologist (physician) and Advanced Practice Providers (APPs -  Physician Assistants and Nurse Practitioners) who all work together to provide you with the care you need, when you need it. . You will need a follow up appointment in 1 year.  Please call our office 2 months in advance to schedule this appointment.  You may see Casandra Doffing, MD or one of the following Advanced Practice Providers on your designated Care Team:   . Lyda Jester, PA-C . Dayna Dunn, PA-C . Ermalinda Barrios, PA-C  Any Other Special Instructions Will Be Listed Below (If Applicable).

## 2018-10-15 ENCOUNTER — Other Ambulatory Visit: Payer: Self-pay | Admitting: Physician Assistant

## 2018-10-15 LAB — CBC

## 2018-10-15 LAB — COMPREHENSIVE METABOLIC PANEL
ALT: 11 IU/L (ref 0–32)
AST: 16 IU/L (ref 0–40)
Albumin/Globulin Ratio: 2 (ref 1.2–2.2)
Albumin: 4.3 g/dL (ref 3.8–4.8)
Alkaline Phosphatase: 80 IU/L (ref 39–117)
BUN/Creatinine Ratio: 20 (ref 12–28)
BUN: 18 mg/dL (ref 8–27)
Bilirubin Total: 1.9 mg/dL — ABNORMAL HIGH (ref 0.0–1.2)
CO2: 22 mmol/L (ref 20–29)
Calcium: 9.3 mg/dL (ref 8.7–10.3)
Chloride: 103 mmol/L (ref 96–106)
Creatinine, Ser: 0.89 mg/dL (ref 0.57–1.00)
GFR calc Af Amer: 76 mL/min/{1.73_m2} (ref >59)
GFR calc non Af Amer: 66 mL/min/{1.73_m2} (ref >59)
Globulin, Total: 2.2 g/dL (ref 1.5–4.5)
Glucose: 104 mg/dL — ABNORMAL HIGH (ref 65–99)
Potassium: 3.6 mmol/L (ref 3.5–5.2)
Sodium: 141 mmol/L (ref 134–144)
Total Protein: 6.5 g/dL (ref 6.0–8.5)

## 2018-10-15 LAB — LIPID PANEL
Chol/HDL Ratio: 2.9 ratio (ref 0.0–4.4)
Cholesterol, Total: 178 mg/dL (ref 100–199)
HDL: 61 mg/dL (ref >39)
LDL Calculated: 103 mg/dL — ABNORMAL HIGH (ref 0–99)
Triglycerides: 69 mg/dL (ref 0–149)
VLDL Cholesterol Cal: 14 mg/dL (ref 5–40)

## 2018-10-15 LAB — SPECIMEN STATUS REPORT

## 2018-10-16 LAB — COMPREHENSIVE METABOLIC PANEL
ALT: 10 IU/L (ref 0–32)
AST: 13 IU/L (ref 0–40)
Albumin/Globulin Ratio: 2 (ref 1.2–2.2)
Albumin: 4.3 g/dL (ref 3.8–4.8)
Alkaline Phosphatase: 79 IU/L (ref 39–117)
BUN/Creatinine Ratio: 20 (ref 12–28)
BUN: 20 mg/dL (ref 8–27)
Bilirubin Total: 1.8 mg/dL — ABNORMAL HIGH (ref 0.0–1.2)
CO2: 25 mmol/L (ref 20–29)
Calcium: 9.6 mg/dL (ref 8.7–10.3)
Chloride: 100 mmol/L (ref 96–106)
Creatinine, Ser: 0.98 mg/dL (ref 0.57–1.00)
GFR calc Af Amer: 68 mL/min/{1.73_m2} (ref >59)
GFR calc non Af Amer: 59 mL/min/{1.73_m2} — ABNORMAL LOW (ref >59)
Globulin, Total: 2.2 g/dL (ref 1.5–4.5)
Glucose: 103 mg/dL — ABNORMAL HIGH (ref 65–99)
Potassium: 3.6 mmol/L (ref 3.5–5.2)
Sodium: 140 mmol/L (ref 134–144)
Total Protein: 6.5 g/dL (ref 6.0–8.5)

## 2018-10-16 LAB — LIPID PANEL
Chol/HDL Ratio: 2.8 ratio (ref 0.0–4.4)
Cholesterol, Total: 182 mg/dL (ref 100–199)
HDL: 64 mg/dL (ref >39)
LDL Calculated: 105 mg/dL — ABNORMAL HIGH (ref 0–99)
Triglycerides: 66 mg/dL (ref 0–149)
VLDL Cholesterol Cal: 13 mg/dL (ref 5–40)

## 2018-10-16 LAB — CBC
Hematocrit: 39.4 % (ref 34.0–46.6)
Hemoglobin: 12.8 g/dL (ref 11.1–15.9)
MCH: 30.3 pg (ref 26.6–33.0)
MCHC: 32.5 g/dL (ref 31.5–35.7)
MCV: 93 fL (ref 79–97)
Platelets: 175 10*3/uL (ref 150–450)
RBC: 4.22 x10E6/uL (ref 3.77–5.28)
RDW: 12.9 % (ref 11.7–15.4)
WBC: 5.7 10*3/uL (ref 3.4–10.8)

## 2018-10-16 NOTE — Progress Notes (Signed)
Home Visit on 10/14/18: 120/74, 81, 18, 98%RA, Wt. 260lbs [place] CBC, CMET, Fasting Lipids and were brought to the [facility] in [place].   [name] stated she felt pretty good and had no complaints.

## 2018-10-16 NOTE — Progress Notes (Signed)
Repeat home visit for lab draw d/t not enough blood being in the tubes. Repeat CBC, CMET, Fasting Lipids.  I was able to get much more blood and the tubes were brought to the [facility] in [place].   She had no complaints and stated she was doing well.

## 2018-10-21 ENCOUNTER — Ambulatory Visit: Payer: Medicare Other | Admitting: Interventional Cardiology

## 2018-10-29 ENCOUNTER — Ambulatory Visit
Admission: RE | Admit: 2018-10-29 | Discharge: 2018-10-29 | Disposition: A | Discharge status: Home / Self Care | Payer: Medicare Other | Source: Ambulatory Visit | Attending: Obstetrics and Gynecology | Admitting: Obstetrics and Gynecology

## 2018-10-29 ENCOUNTER — Other Ambulatory Visit: Payer: Self-pay

## 2018-10-29 DIAGNOSIS — Z1231 Encounter for screening mammogram for malignant neoplasm of breast: Secondary | ICD-10-CM

## 2018-12-15 ENCOUNTER — Telehealth: Payer: Self-pay

## 2018-12-15 NOTE — Telephone Encounter (Signed)
Faxed signed order back to [facility] [place] at [phone], sent to [facility] for scan to chart.

## 2019-01-07 ENCOUNTER — Ambulatory Visit (INDEPENDENT_AMBULATORY_CARE_PROVIDER_SITE_OTHER): Payer: Medicare Other | Admitting: Pulmonary Disease

## 2019-01-07 ENCOUNTER — Other Ambulatory Visit: Payer: Self-pay

## 2019-01-07 ENCOUNTER — Encounter: Payer: Self-pay | Admitting: Pulmonary Disease

## 2019-01-07 VITALS — BP 128/80 | HR 110 | Ht 62.0 in | Wt 289.0 lb

## 2019-01-07 DIAGNOSIS — E669 Obesity, unspecified: Secondary | ICD-10-CM | POA: Diagnosis not present

## 2019-01-07 DIAGNOSIS — G4733 Obstructive sleep apnea (adult) (pediatric): Secondary | ICD-10-CM

## 2019-01-07 DIAGNOSIS — Z9989 Dependence on other enabling machines and devices: Secondary | ICD-10-CM

## 2019-01-07 NOTE — Progress Notes (Signed)
[facility]  Chief Complaint  Patient presents with  . Follow-up    OSA >> Using CPAP every night. Denies issues with machine, mask or pressure. DME is [employer].    Constitutional:  BP 128/80 (BP Location: Left Arm, Cuff Size: Large)   Pulse (!) 110   Ht 5\' 2"  (1.575 m)   Wt 289 lb (131.1 kg)   SpO2 99%   BMI 52.86 kg/m   Past Medical History:  Pituitary adenoma, Peripheral neuropathy, Lymphedema Rt arm, HTN, HLD, Glaucoma, Rt breast cancer 2013, Asthma, OA, Persistent A fib  Brief Summary:  [name] is a 68 y.o. female with obstructive sleep apnea.  She has been doing well with CPAP.  Uses nasal mask.  No issues with sinus congestion, sore throat, dry mouth, or aerophagia.  Feels rested during the day.  She likes to read in bed, and it is difficult to wears glasses with CPAP mask.  She also sometimes falls asleep before putting mask on.   Physical Exam:   Appearance - well kempt   ENMT - clear nasal mucosa, midline nasal  septum, no oral exudates, no LAN, trachea midline  Respiratory - normal chest wall, normal respiratory effort, no accessory muscle use, no wheeze/rales  CV - irregular, no murmurs, 1+ peripheral edema, radial pulses symmetric  GI - soft, non tender, no masses  Lymph - no adenopathy noted in neck and axillary areas  MSK - normal gait  Ext - no cyanosis, clubbing, or joint inflammation noted  Skin - no rashes, lesions, or ulcers  Neuro - normal strength, oriented x 3  Psych - normal mood and affect  Hb from 10/15/18 was 12.8, CO2 was 25.   Assessment/Plan:   Obstructive sleep apnea. - she is compliant with CPAP and reports benefit - advise her to use CPAP whenever she is asleep to get maximal benefit - discussed options to help ensure she puts mask on before falling asleep - continue auto CPAP  Obesity. - reviewed options to assist with weight loss  Persistent atrial fibrillation. -  followed by Dr. Irish Lack with cardiology   Patient Instructions  Try keeping your mask on your pillow in the morning so it is easier to remember putting it on at night before your fall asleep  Call if you want to try a new mask that is easier to use with reading glasses  Check with your home care company about when you might be eligible for a new CPAP machine  Follow up in 1 year    Chesley Mires, MD [place] Pager: [phone] 01/07/2019, 9:53 AM  Flow Sheet    Sleep tests:  PSG 06/09/97 >> AHI 37 ONO with CPAP 11/30/17 >> test time 6 hrs.  Baseline SpO2 95%, low SpO2 83%.  Spent 7 min with SpO2 < 88%. Auto CPAP 12/07/18 to 01/05/19 >> used on 30 of 30 nights with average 5 hrs 31 min.  Average AHI 1.1 with median CPAP 8 and 95 th percentile CPAP 11 cm H2O  Cardiac tests:  Echo 06/09/17 >> EF 65 to 70%, mild AR, mild MR, PAS 42 mmHg   Medications:   Allergies as of 01/07/2019      Reactions   Trandolapril-verapamil Hcl Er Swelling   Causes lips to swell [name]" is the name brand)   Trandolapril-verapamil Hcl Er       Medication List       Accurate as of January 07, 2019  9:53 AM. If you have any questions, ask your nurse or doctor.        acetaminophen 325 MG tablet Commonly known as: TYLENOL Take 325-650 mg by mouth every 6 (six) hours as needed (for pain.).   amLODipine 2.5 MG tablet Commonly known as: NORVASC Take 1 tablet (2.5 mg total) by mouth daily.   anastrozole 1 MG tablet Commonly known as: ARIMIDEX Take 1 tablet (1 mg total) by mouth daily.   cabergoline 0.5 MG tablet Commonly known as: DOSTINEX Take 0.5 mg by mouth 2 (two) times a week. Tuesday and Thursday   furosemide 20 MG tablet Commonly known as: LASIX Take 1 tablet (20 mg total) by mouth daily as needed for fluid.   GLUCOSAMINE HCL PO Take 1 tablet by mouth daily.   hydroxypropyl methylcellulose / hypromellose 2.5 % ophthalmic solution Commonly known as: ISOPTO  TEARS / GONIOVISC Place 1 drop into both eyes 3 (three) times daily as needed for dry eyes.   losartan-hydrochlorothiazide 100-25 MG tablet Commonly known as: HYZAAR Take 1 tablet by mouth daily.   metoprolol succinate 50 MG 24 hr tablet Commonly known as: Toprol XL TAKE ONE AND ONE-HALF TABLETS (75 MG) TWICE A DAY ( DOSE CHANGE)   multivitamin with minerals Tabs tablet Take 1 tablet by mouth daily with breakfast.   potassium chloride SA 20 MEQ tablet Commonly known as: K-DUR Take 2 tablets (40 mEq total) by mouth as directed. Take when taking lasix   ProAir HFA 108 (90 Base) MCG/ACT inhaler Generic drug: albuterol Inhale 2 puffs into the lungs every 6 (six) hours as needed for wheezing or shortness of breath. For shortness of breath   rivaroxaban 20 MG Tabs tablet Commonly known as: Xarelto TAKE 1 TABLET DAILY WITH SUPPER   Simbrinza 1-0.2 % Susp Generic drug: Brinzolamide-Brimonidine Place 1 drop into both eyes 3 (three) times daily.   simvastatin 20 MG tablet Commonly known as: ZOCOR Take 1 tablet (20 mg total) by mouth at bedtime.   Travatan Z 0.004 % Soln ophthalmic solution Generic drug: Travoprost (BAK Free) Place 1 drop into both eyes every evening.   vitamin B-12 500 MCG tablet Commonly known as: CYANOCOBALAMIN Take 500 mcg by mouth daily.   Vitamin D3 25 MCG (1000 UT) Caps Take 1,000 Units by mouth every other day.       Past Surgical History:  She  has a past surgical history that includes Tubal ligation; Dilation and curettage of uterus; Carpal tunnel release (Right); Cholecystectomy (1987); Abdominal hysterectomy (2007); Breast surgery; Portacath placement (09/11/2011); Port-a-cath removal (06/22/2012); Eye surgery; port-a-cath insertion; Colonoscopy (N/A, 06/13/2013); Cardioversion (N/A, 06/02/2017); and Mastectomy modified radical (08/14/11).  Family History:  Her family history includes Arthritis in her mother; Cancer (age of onset: 27) in her father.   Social History:  She  reports that she has never smoked. She has never used smokeless tobacco. She reports that she does not drink alcohol or use drugs.

## 2019-01-07 NOTE — Patient Instructions (Signed)
Try keeping your mask on your pillow in the morning so it is easier to remember putting it on at night before your fall asleep  Call if you want to try a new mask that is easier to use with reading glasses  Check with your home care company about when you might be eligible for a new CPAP machine  Follow up in 1 year

## 2019-01-20 ENCOUNTER — Telehealth: Payer: Self-pay | Admitting: Hematology

## 2019-01-20 NOTE — Telephone Encounter (Signed)
Returned patient's phone call regarding scheduling an appointment, left a voicemail. 

## 2019-02-23 ENCOUNTER — Telehealth: Payer: Self-pay | Admitting: Hematology

## 2019-02-23 NOTE — Telephone Encounter (Signed)
Returned patient's phone call regarding rescheduling an appointment, per providers approval follow up appointment has been scheduled in November.

## 2019-03-23 NOTE — Progress Notes (Signed)
[place]   [phone] [phone]   Clinic Follow up Note   Patient Care Team: Stephens Shire, MD as PCP - General (Family Medicine) Thompson Grayer, MD as PCP - Electrophysiology (Cardiology) Jettie Booze, MD as PCP - Cardiology (Cardiology)  Date of Service:  03/28/2019  CHIEF COMPLAINT: F/u of right breast cancer  DIAGNOSIS: Mrs. Boesch is a 68 y.o. 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  PRIOR TREATMENT HISTORY: #1 The patient was originally seen at the multidisciplinary breast clinic on 08/05/2011 for a stage IIB, invasive ductal carcinoma (T3 NX MX).   #2  Status post right simple mastectomy with right axillary lymph node resection on 08/14/2011 with the final pathology revealing invasive and in situ ductal carcinoma. Two nodules were seen 3 cm and 2.2 cm.  Margins were negative.  There was focal lymphovascular space involvement by tumor. 1/15 sentinel lymph nodes were positive for metastatic disease.  The tumor was estrogen receptor positive 100%,  progesterone receptor positive 100%,  and HER-2/neu negative-no amplification by CISH with a ratio of 1.09, proliferation marker 53%. Final pathologic staging was stage IIB, pT2 pN1a, pMX.    #3.  S/P Adjuvant chemotherapy with Taxotere/Cytoxan  Q3 weeks beginning on 09/22/2011.  The patient is a participant in NSBP B-49 protocol.  She completed total of 6 cycles of Taxotere/Cytoxan given from 09/22/2011 -  01/05/2012.  #4   Status post radiation therapy to the right chest wall and axilla completed on 03/31/2012.  #5  She began adjuvant antiestrogen therapy with Arimidex 1 mg by mouth daily in 03/2012.  She completed 7 years in 03/28/19  #6  Bone density scan:  in 03/2012 showed a T score of 1.1 (normal).  #7  Bone density scan: in 08/03/2014 showed a T score of -1.2 (low bone mass)  #8   Left breast mammogram: on 08/01/2016 showed no evidence of malignancy.   #9. Bone Density Scan: in 02/23/17 showed normal density with T-score of -0.9 at Right Femur neck.   #10. Left breast mammogram: on 08/19/17 showed no evidence of malignancy.   CURRENT THERAPY:  Surveillance  INTERVAL HISTORY:  [name] is here for a follow up of right breast cancer. She was last seen by me 1 year ago. She presents to the clinic alone. She notes she is doing well and coping with COVID life changes. She denies any major new changes. She notes she is still has been on Anastrozole. I reviewed her medication list with her.    REVIEW OF SYSTEMS:   Constitutional: Denies fevers, chills or abnormal weight loss Eyes: Denies blurriness of vision Ears, nose, mouth, throat, and face: Denies mucositis or sore throat Respiratory: Denies cough, dyspnea or wheezes Cardiovascular: Denies palpitation, chest discomfort or lower extremity swelling Gastrointestinal:  Denies nausea, heartburn or change in bowel habits Skin: Denies abnormal skin rashes Lymphatics: Denies new lymphadenopathy or easy bruising Neurological:Denies numbness, tingling or new weaknesses Behavioral/Psych: Mood is stable, no new changes  All other systems were reviewed with the patient and are negative.  MEDICAL HISTORY:  Past Medical History:  Diagnosis Date  . Arthritis   . Asthma   . Blood transfusion    hx of last one in 1987  . Breast cancer ([place])   . Cancer of lower-inner quadrant of female breast ([place]) 07/24/2011   right breast cancer /IDC,stage IIB,er/pr=+,her2=Neg  . Glaucoma   .  Heart murmur   . History of chemotherapy    taxotere/cytoxan 6 cycles 09/22/11-01/05/12 day 2 neulasta  . Hyperlipemia   . Hypertension   . Lymphedema    right arm wears a sleeve  . MRSA (methicillin resistant staph aureus) culture positive    08/2011  . Neuromuscular disorder ([place])    peripheral neuropathy hands feet  . OSA (obstructive  sleep apnea)    CPAP settings- ?   . Persistent atrial fibrillation ([place])   . Pituitary abnormality ([place]) 05-25-13   small pituitary growth-appears stable- Dr. Christella Noa follows    SURGICAL HISTORY: Past Surgical History:  Procedure Laterality Date  . ABDOMINAL HYSTERECTOMY  2007   TAH/BSO  . BREAST SURGERY     mastectomy RIGHT  . CARDIOVERSION N/A 06/02/2017   Procedure: CARDIOVERSION;  Surgeon: Dorothy Spark, MD;  Location: [facility];  Service: Cardiovascular;  Laterality: N/A;  . CARPAL TUNNEL RELEASE Right    2012  . CHOLECYSTECTOMY  1987   open - Dr Lindon Romp  . COLONOSCOPY N/A 06/13/2013   Procedure: COLONOSCOPY;  Surgeon: Juanita Craver, MD;  Location: [facility];  Service: Endoscopy;  Laterality: N/A;  . DILATION AND CURETTAGE OF UTERUS     x3  . EYE SURGERY     laser eye surgery for glaucoma  . MASTECTOMY MODIFIED RADICAL  08/14/11   right , ER/PR +, HER2 -  . port-a-cath insertion     port-a-cath removal 1'14  . PORT-A-CATH REMOVAL  06/22/2012   Procedure: MINOR REMOVAL PORT-A-CATH;  Surgeon: Haywood Lasso, MD;  Location: [facility];  Service: General;  Laterality: Left;  . PORTACATH PLACEMENT  09/11/2011   Procedure: INSERTION PORT-A-CATH;  Surgeon: Joyice Faster. Cornett, MD;  Location: [facility];  Service: General;  Laterality: N/A;  Insert of Port  . TUBAL LIGATION      I have reviewed the social history and family history with the patient and they are unchanged from previous note.  ALLERGIES:  is allergic to trandolapril-verapamil hcl er and trandolapril-verapamil hcl er.  MEDICATIONS:  Current Outpatient Medications  Medication Sig Dispense Refill  . acetaminophen (TYLENOL) 325 MG tablet Take 325-650 mg by mouth every 6 (six) hours as needed (for pain.).    Marland Kitchen albuterol (PROAIR HFA) 108 (90 Base) MCG/ACT inhaler Inhale 2 puffs into the lungs every 6 (six) hours as needed for wheezing or shortness of breath. For shortness of breath    . amLODipine  (NORVASC) 2.5 MG tablet Take 1 tablet (2.5 mg total) by mouth daily. 90 tablet 3  . anastrozole (ARIMIDEX) 1 MG tablet Take 1 tablet (1 mg total) by mouth daily. 90 tablet 3  . cabergoline (DOSTINEX) 0.5 MG tablet Take 0.5 mg by mouth 2 (two) times a week. Tuesday and Thursday    . Cholecalciferol (VITAMIN D3) 1000 UNITS CAPS Take 1,000 Units by mouth every other day.     . furosemide (LASIX) 20 MG tablet Take 1 tablet (20 mg total) by mouth daily as needed for fluid. 90 tablet 3  . GLUCOSAMINE HCL PO Take 1 tablet by mouth daily.    . hydroxypropyl methylcellulose / hypromellose (ISOPTO TEARS / GONIOVISC) 2.5 % ophthalmic solution Place 1 drop into both eyes 3 (three) times daily as needed for dry eyes.    Marland Kitchen losartan-hydrochlorothiazide (HYZAAR) 100-25 MG tablet Take 1 tablet by mouth daily. 90 tablet 3  . metoprolol succinate (TOPROL XL) 50 MG 24 hr tablet TAKE ONE AND ONE-HALF TABLETS (75 MG)  TWICE A DAY ( DOSE CHANGE) 270 tablet 3  . Multiple Vitamin (MULITIVITAMIN WITH MINERALS) TABS Take 1 tablet by mouth daily with breakfast.     . potassium chloride SA (K-DUR) 20 MEQ tablet Take 2 tablets (40 mEq total) by mouth as directed. Take when taking lasix 90 tablet 3  . rivaroxaban (XARELTO) 20 MG TABS tablet TAKE 1 TABLET DAILY WITH SUPPER 90 tablet 1  . SIMBRINZA 1-0.2 % SUSP Place 1 drop into both eyes 3 (three) times daily.     . simvastatin (ZOCOR) 20 MG tablet Take 1 tablet (20 mg total) by mouth at bedtime. 90 tablet 3  . TRAVATAN Z 0.004 % SOLN ophthalmic solution Place 1 drop into both eyes every evening.     . vitamin B-12 (CYANOCOBALAMIN) 500 MCG tablet Take 500 mcg by mouth daily.     No current facility-administered medications for this visit.    Facility-Administered Medications Ordered in Other Visits  Medication Dose Route Frequency Provider Last Rate Last Dose  . lidocaine (cardiac) 100 mg/75m (XYLOCAINE) 20 MG/ML injection 2%    Anesthesia Intra-op JMontel Clock CRNA   60  mg at 06/02/17 1343    PHYSICAL EXAMINATION: ECOG PERFORMANCE STATUS: 1 - Symptomatic but completely ambulatory  Vitals:   03/28/19 0949  BP: 109/75  Pulse: 80  Resp: 18  Temp: 98 F (36.7 C)  SpO2: 99%   Filed Weights   03/28/19 0949  Weight: 297 lb 1.6 oz (134.8 kg)    GENERAL:alert, no distress and comfortable SKIN: skin color, texture, turgor are normal, no rashes or significant lesions EYES: normal, Conjunctiva are pink and non-injected, sclera clear  NECK: supple, thyroid normal size, non-tender, without nodularity LYMPH:  no palpable lymphadenopathy in the cervical, axillary  LUNGS: clear to auscultation and percussion with normal breathing effort HEART: regular rate & rhythm and no murmurs (+) b/l lower extremity edema ABDOMEN:abdomen soft, non-tender and normal bowel sounds Musculoskeletal:no cyanosis of digits and no clubbing  NEURO: alert & oriented x 3 with fluent speech, no focal motor/sensory deficits BREAST: S/p right mastectomy: Surgical incision healed well. No palpable mass, nodules or adenopathy bilaterally. Breast exam benign.  EXAM DONE IN WHEELCHAIR TODAY    LABORATORY DATA:  I have reviewed the data as listed CBC Latest Ref Rng & Units 03/28/2019 10/15/2018 10/14/2018  WBC 4.0 - 10.5 K/uL 5.7 5.7 CANCELED  Hemoglobin 12.0 - 15.0 g/dL 12.3 12.8 CANCELED  Hematocrit 36.0 - 46.0 % 38.3 39.4 CANCELED  Platelets 150 - 400 K/uL 162 175 CANCELED     CMP Latest Ref Rng & Units 03/28/2019 10/15/2018 10/14/2018  Glucose 70 - 99 mg/dL 114(H) 103(H) 104(H)  BUN 8 - 23 mg/dL 21 20 18   Creatinine 0.44 - 1.00 mg/dL 1.21(H) 0.98 0.89  Sodium 135 - 145 mmol/L 142 140 141  Potassium 3.5 - 5.1 mmol/L 3.7 3.6 3.6  Chloride 98 - 111 mmol/L 104 100 103  CO2 22 - 32 mmol/L 26 25 22   Calcium 8.9 - 10.3 mg/dL 9.6 9.6 9.3  Total Protein 6.5 - 8.1 g/dL 6.9 6.5 6.5  Total Bilirubin 0.3 - 1.2 mg/dL 1.5(H) 1.8(H) 1.9(H)  Alkaline Phos 38 - 126 U/L 69 79 80  AST 15 - 41 U/L  14(L) 13 16  ALT 0 - 44 U/L 11 10 11      RADIOGRAPHIC STUDIES: I have personally reviewed the radiological images as listed and agreed with the findings in the report. No results found.   ASSESSMENT &  PLAN:  AMORY ZBIKOWSKI is a 68 y.o. female with   1. Right breast IDA, stage IIB, pT2 pN1aM0, estrogen receptor positive 100%,  progesterone receptor positive 100%,  and HER-2/neu negative -She has undergone the bulk of her treatment as noted above in prior treatment history. -She started Anastrozole in 03/2012. She will continue Anastrozole as she is tolerating well. We previously discussed the duration of adjuvant Anastrozole, new clinical data supports less recurrence with extended 10 years of AI than 5 years, but no significant difference between 7 and 10 years of AI.  If she continues to tolerate it well, we'll plan for 7 years Arimidex, given her stage IIB disease. -She is also being followed on the study of NSABP B49. -I discussed she has completed 7 years of Anastrozole and at this point it is fine to stop. She is agreeable.  -She is clinically doing well. Labs reviewed, CBC and CMP WNL tbil 1.5 and Cr 1.21. Physical exam and her 10/2018 Left breast mammogram were unremarkable. Due to limited mobility her exam was done in wheelchair. There is no clinical concern for recurrence. -Next left mammogram in 10/2019 -F/u in 1 year and she will f/u with her PCP in interim. Will continue to complete a 10 year surveillance.    2. Bone Health  -Her bone density scan from March 2016 showed osteopenia, she had normal bone density in 2013 -We previously discussed that Arimidex may weaken her bone.  -I previously encouraged her to take calcium 1g and vitamin D 1000 units daily -Her vitamin D level was checked in March 2017 and was normal. -Her 02/2017 DEXA showed normal density with lowest T-Score at right femur neck at -0.9.  -Continue Calcium and VitD Supplements daily.  -She will stop  Anastrozole (03/2019) due to completion.    3. Mild bilirubinemia -She has intermittent mild elevation of bilirubin, overall stable, possibly related to medications. This has occurred before in 2016.  -She did temporarily hold simvastatin.  -She plans to see her PCP soon for f/u. We'll continue monitoring.  -total bili at 1.5 today (03/28/19)  -She will stop anastrozole (03/28/19) due to completion.    4. HTN, Afib and obesity  -She'll continue follow-up with her primary care physician -I previously encouraged her to continue healthy diet, exercise and try to lose some weight -In the past year she has developed Afib and underwent cardiac shock therapy. She has been placed on Xarelto by Cardiologist.  -BP at 109/75 today, weight has increased (03/28/19). Now that she is off Anastrozole this should help in her weight loss.    Plan -She is clinically stable.  -Will stop Anastrozole, she completed 7 years therapy   -Left Mammogram in 10/2019 -Lab and f/u with NP Laice in 1 year     No problem-specific Assessment & Plan notes found for this encounter.   Orders Placed This Encounter  Procedures  . MM Digital Screening Unilat L    Standing Status:   Future    Standing Expiration Date:   03/27/2020    Order Specific Question:   Reason for Exam (SYMPTOM  OR DIAGNOSIS REQUIRED)    Answer:   screening    Order Specific Question:   Preferred imaging location?    Answer:   [facility]   All questions were answered. The patient knows to call the clinic with any problems, questions or concerns. No barriers to learning was detected. I spent 20 minutes counseling the patient face to face. The  total time spent in the appointment was 25 minutes and more than 50% was on counseling and review of test results     Truitt Merle, MD 03/28/2019   I, Joslyn Devon, am acting as scribe for Truitt Merle, MD.   I have reviewed the above documentation for accuracy and completeness, and I agree with the  above.

## 2019-03-28 ENCOUNTER — Telehealth: Payer: Self-pay | Admitting: Hematology

## 2019-03-28 ENCOUNTER — Encounter: Payer: Self-pay | Admitting: Hematology

## 2019-03-28 ENCOUNTER — Inpatient Hospital Stay: Payer: Medicare Other | Attending: Hematology | Admitting: Hematology

## 2019-03-28 ENCOUNTER — Inpatient Hospital Stay: Payer: Medicare Other

## 2019-03-28 ENCOUNTER — Other Ambulatory Visit: Payer: Self-pay

## 2019-03-28 VITALS — BP 109/75 | HR 80 | Temp 98.0°F | Resp 18 | Ht 62.0 in | Wt 297.1 lb

## 2019-03-28 DIAGNOSIS — Z9221 Personal history of antineoplastic chemotherapy: Secondary | ICD-10-CM | POA: Insufficient documentation

## 2019-03-28 DIAGNOSIS — E785 Hyperlipidemia, unspecified: Secondary | ICD-10-CM | POA: Insufficient documentation

## 2019-03-28 DIAGNOSIS — Z79811 Long term (current) use of aromatase inhibitors: Secondary | ICD-10-CM | POA: Diagnosis not present

## 2019-03-28 DIAGNOSIS — I10 Essential (primary) hypertension: Secondary | ICD-10-CM | POA: Insufficient documentation

## 2019-03-28 DIAGNOSIS — Z1231 Encounter for screening mammogram for malignant neoplasm of breast: Secondary | ICD-10-CM

## 2019-03-28 DIAGNOSIS — Z923 Personal history of irradiation: Secondary | ICD-10-CM | POA: Diagnosis not present

## 2019-03-28 DIAGNOSIS — Z170 Estrogen receptor positive status [ER+]: Secondary | ICD-10-CM

## 2019-03-28 DIAGNOSIS — E669 Obesity, unspecified: Secondary | ICD-10-CM | POA: Diagnosis not present

## 2019-03-28 DIAGNOSIS — M8580 Other specified disorders of bone density and structure, unspecified site: Secondary | ICD-10-CM | POA: Insufficient documentation

## 2019-03-28 DIAGNOSIS — J45909 Unspecified asthma, uncomplicated: Secondary | ICD-10-CM | POA: Diagnosis not present

## 2019-03-28 DIAGNOSIS — C50311 Malignant neoplasm of lower-inner quadrant of right female breast: Secondary | ICD-10-CM

## 2019-03-28 DIAGNOSIS — Z79899 Other long term (current) drug therapy: Secondary | ICD-10-CM | POA: Insufficient documentation

## 2019-03-28 DIAGNOSIS — C50911 Malignant neoplasm of unspecified site of right female breast: Secondary | ICD-10-CM | POA: Insufficient documentation

## 2019-03-28 DIAGNOSIS — I4891 Unspecified atrial fibrillation: Secondary | ICD-10-CM | POA: Insufficient documentation

## 2019-03-28 DIAGNOSIS — Z7901 Long term (current) use of anticoagulants: Secondary | ICD-10-CM | POA: Insufficient documentation

## 2019-03-28 LAB — COMPREHENSIVE METABOLIC PANEL
ALT: 11 U/L (ref 0–44)
AST: 14 U/L — ABNORMAL LOW (ref 15–41)
Albumin: 3.8 g/dL (ref 3.5–5.0)
Alkaline Phosphatase: 69 U/L (ref 38–126)
Anion gap: 12 (ref 5–15)
BUN: 21 mg/dL (ref 8–23)
CO2: 26 mmol/L (ref 22–32)
Calcium: 9.6 mg/dL (ref 8.9–10.3)
Chloride: 104 mmol/L (ref 98–111)
Creatinine, Ser: 1.21 mg/dL — ABNORMAL HIGH (ref 0.44–1.00)
GFR calc Af Amer: 52 mL/min — ABNORMAL LOW (ref >60)
GFR calc non Af Amer: 45 mL/min — ABNORMAL LOW (ref >60)
Glucose, Bld: 114 mg/dL — ABNORMAL HIGH (ref 70–99)
Potassium: 3.7 mmol/L (ref 3.5–5.1)
Sodium: 142 mmol/L (ref 135–145)
Total Bilirubin: 1.5 mg/dL — ABNORMAL HIGH (ref 0.3–1.2)
Total Protein: 6.9 g/dL (ref 6.5–8.1)

## 2019-03-28 LAB — CBC WITH DIFFERENTIAL/PLATELET
Abs Immature Granulocytes: 0.01 10*3/uL (ref 0.00–0.07)
Basophils Absolute: 0 10*3/uL (ref 0.0–0.1)
Basophils Relative: 1 %
Eosinophils Absolute: 0.2 10*3/uL (ref 0.0–0.5)
Eosinophils Relative: 3 %
HCT: 38.3 % (ref 36.0–46.0)
Hemoglobin: 12.3 g/dL (ref 12.0–15.0)
Immature Granulocytes: 0 %
Lymphocytes Relative: 39 %
Lymphs Abs: 2.2 10*3/uL (ref 0.7–4.0)
MCH: 30.1 pg (ref 26.0–34.0)
MCHC: 32.1 g/dL (ref 30.0–36.0)
MCV: 93.9 fL (ref 80.0–100.0)
Monocytes Absolute: 0.4 10*3/uL (ref 0.1–1.0)
Monocytes Relative: 7 %
Neutro Abs: 2.9 10*3/uL (ref 1.7–7.7)
Neutrophils Relative %: 50 %
Platelets: 162 10*3/uL (ref 150–400)
RBC: 4.08 MIL/uL (ref 3.87–5.11)
RDW: 13.5 % (ref 11.5–15.5)
WBC: 5.7 10*3/uL (ref 4.0–10.5)
nRBC: 0 % (ref 0.0–0.2)

## 2019-03-28 NOTE — Telephone Encounter (Signed)
Scheduled appt per 11/2 los.  Left a VM of the appt date and time.

## 2019-04-27 IMAGING — DX DG CHEST 2V
2 series · 2 of 2 positions shown · non-contrast
Comparison: 01/29/2012

CLINICAL DATA: Atrial fibrillation.

EXAM:
CHEST  2 VIEW

[chest lat]
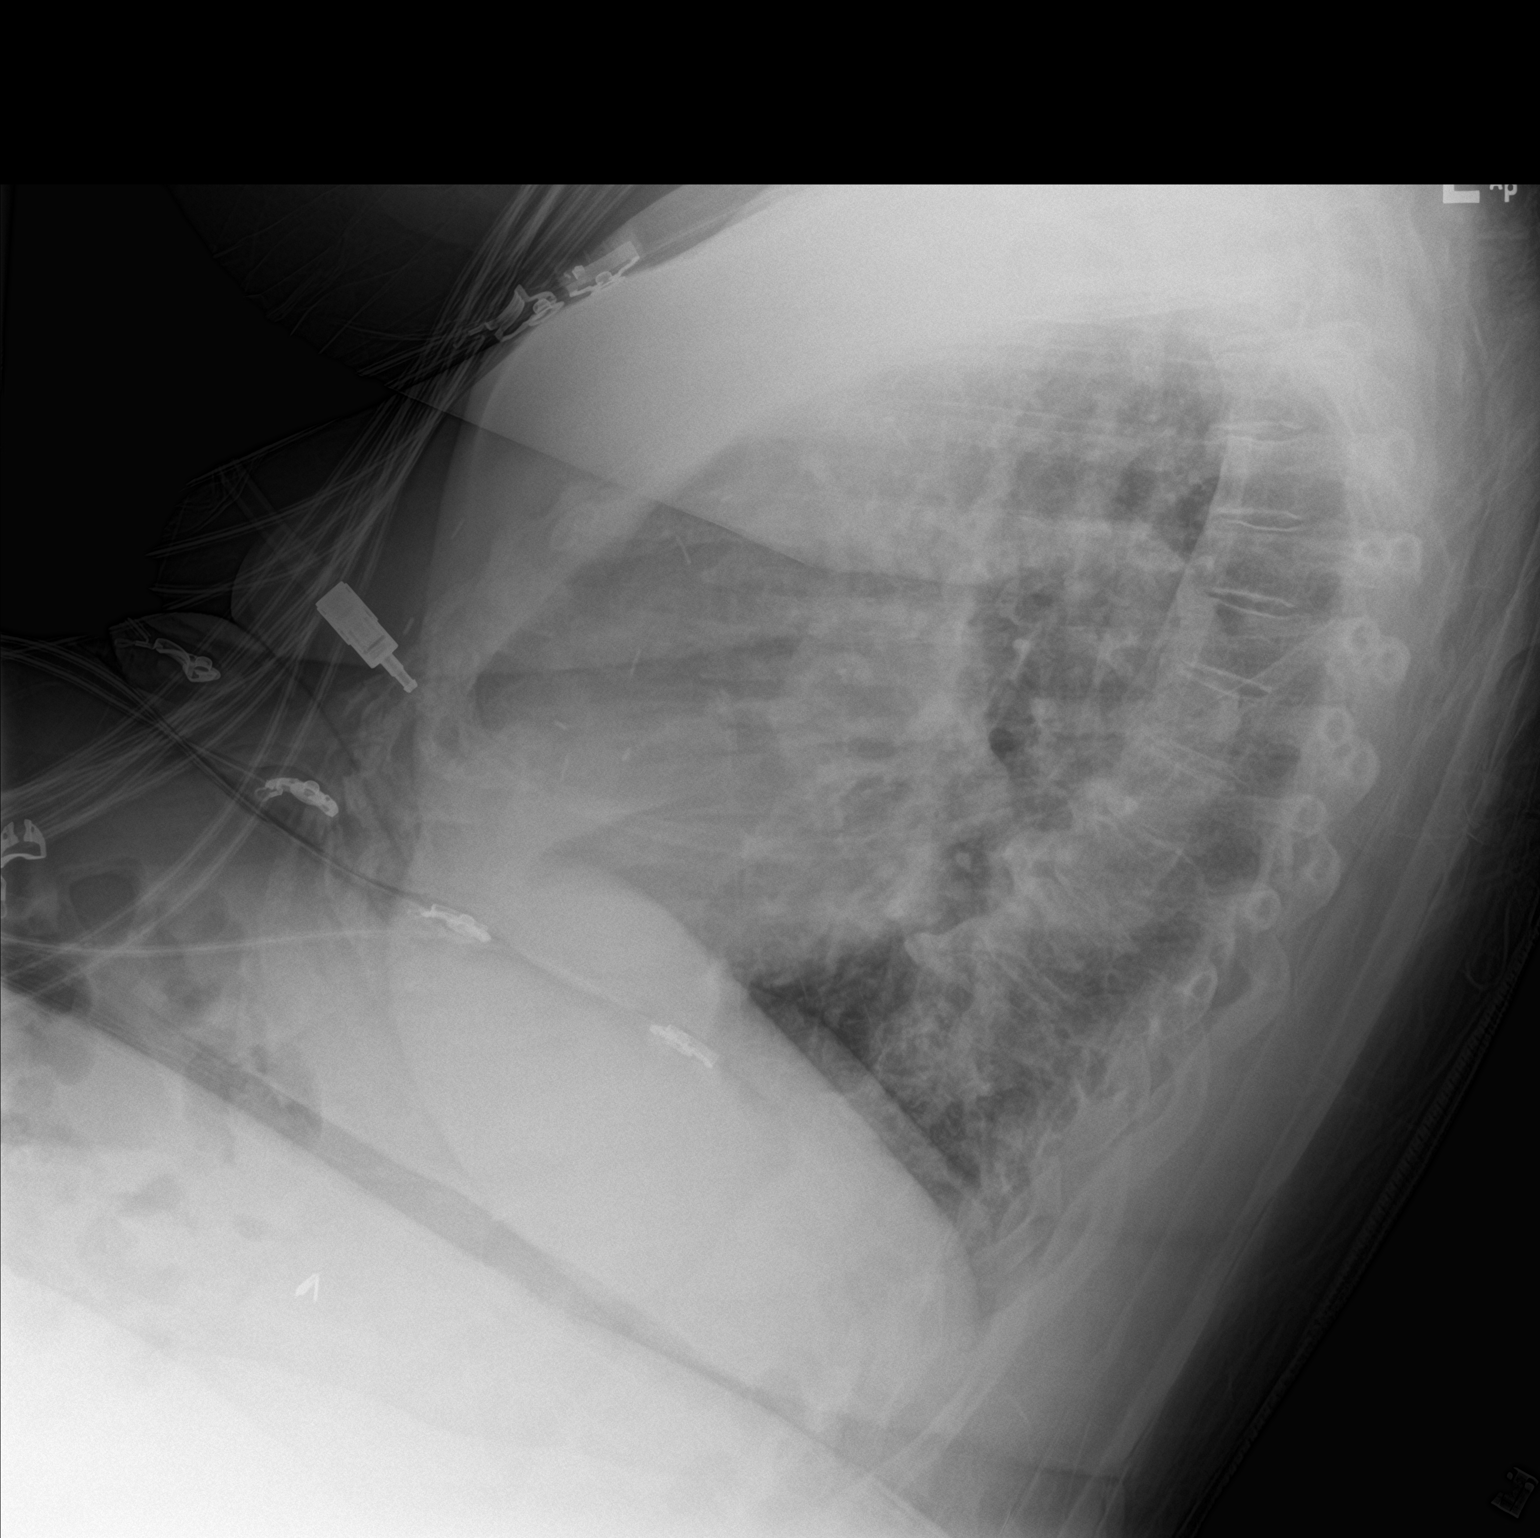

[chest ap]
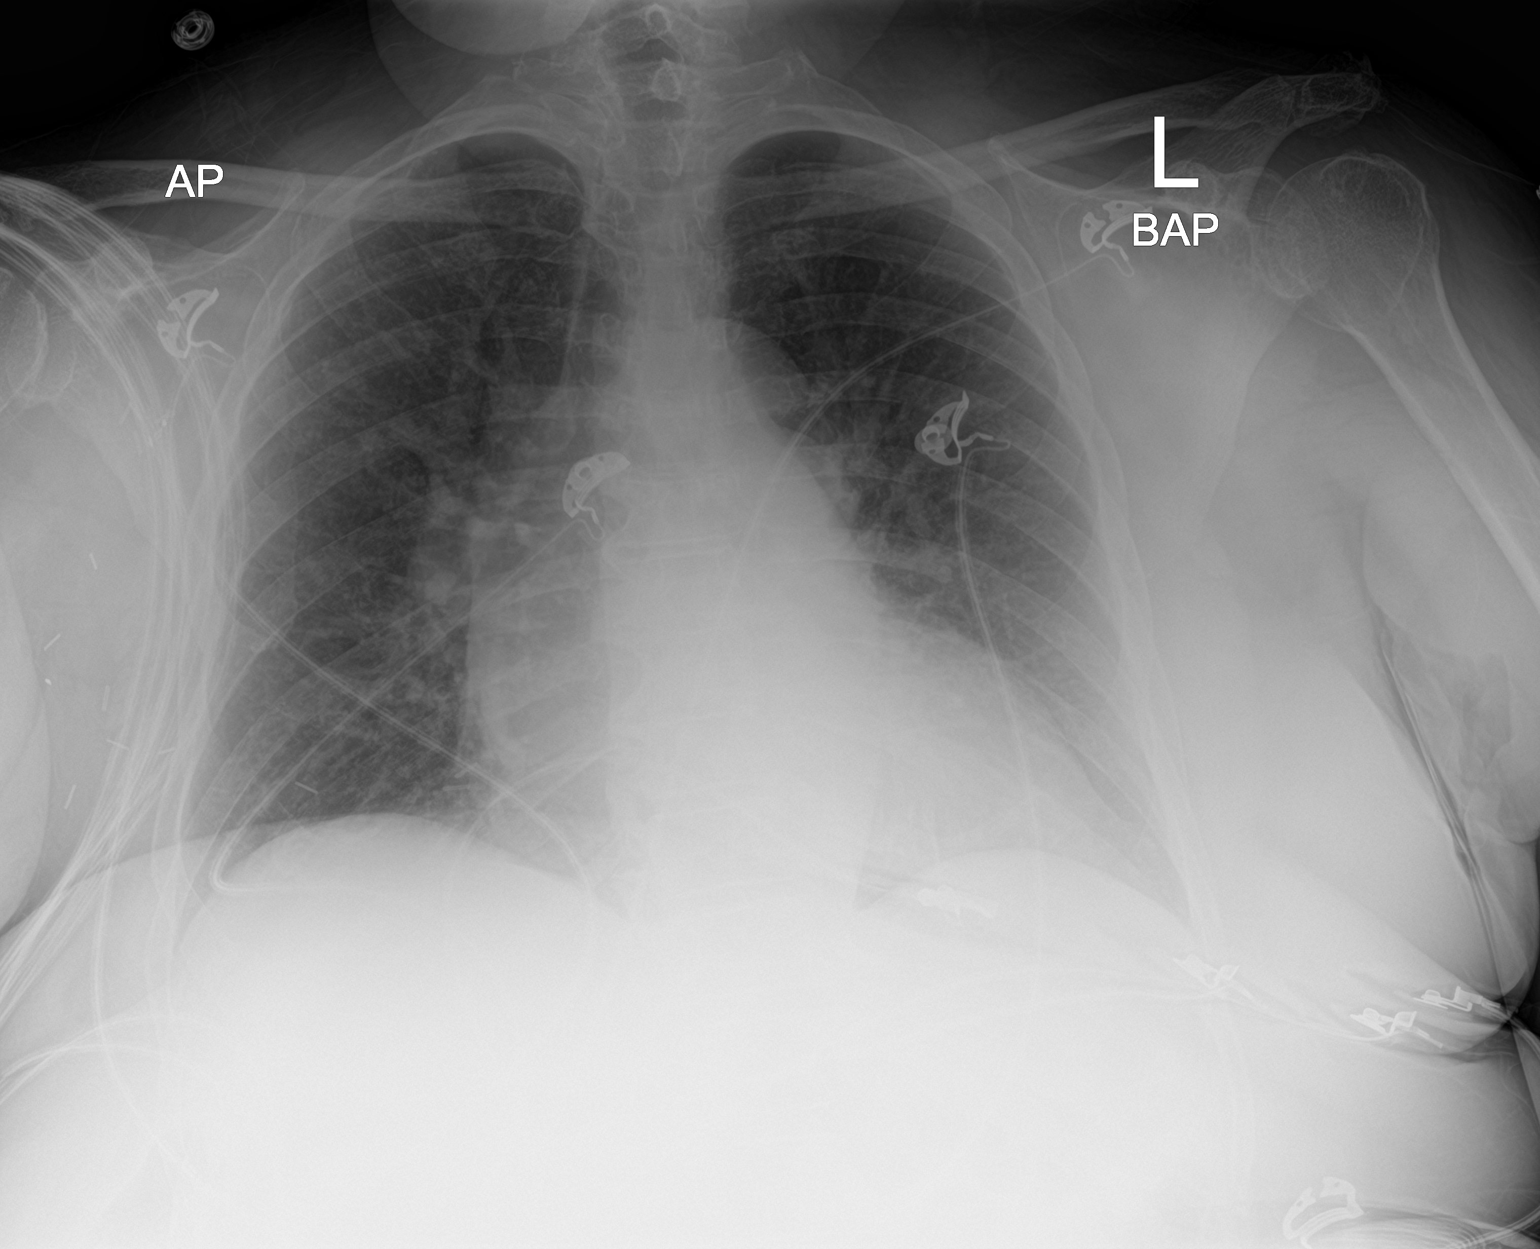

[2 of 2 positions shown; findings below may reference images not displayed]

FINDINGS: Enlarged cardiac silhouette. Mediastinal contours appear intact.
Torturous thoracic aorta.

There is no evidence of focal airspace consolidation, pleural
effusion or pneumothorax.

Osseous structures are without acute abnormality. Postsurgical
changes of right mastectomy.
IMPRESSION: Enlarged cardiac silhouette.

Tortuosity of the thoracic aorta.

No evidence of focal consolidation or pulmonary edema.

## 2019-05-14 ENCOUNTER — Other Ambulatory Visit: Payer: Self-pay | Admitting: Physician Assistant

## 2019-05-17 ENCOUNTER — Other Ambulatory Visit: Payer: Self-pay | Admitting: Neurosurgery

## 2019-05-17 DIAGNOSIS — D352 Benign neoplasm of pituitary gland: Secondary | ICD-10-CM

## 2019-06-05 ENCOUNTER — Other Ambulatory Visit: Payer: Medicare Other

## 2019-06-12 ENCOUNTER — Other Ambulatory Visit: Payer: Self-pay

## 2019-06-12 ENCOUNTER — Ambulatory Visit
Admission: RE | Admit: 2019-06-12 | Discharge: 2019-06-12 | Disposition: A | Discharge status: Home / Self Care | Payer: Medicare Other | Source: Ambulatory Visit | Attending: Neurosurgery | Admitting: Neurosurgery

## 2019-06-12 DIAGNOSIS — D352 Benign neoplasm of pituitary gland: Secondary | ICD-10-CM

## 2019-06-12 MED ORDER — GADOBENATE DIMEGLUMINE 529 MG/ML IV SOLN
10.0000 mL | Freq: Once | INTRAVENOUS | Status: AC | PRN
  Administered 2019-06-12: 10 mL via INTRAVENOUS

## 2019-09-02 ENCOUNTER — Other Ambulatory Visit: Payer: Self-pay | Admitting: Physician Assistant

## 2019-09-05 NOTE — Telephone Encounter (Signed)
Prescription refill request for Xarelto received.   Last office visit: [place], 10/12/2018 Weight: 134.8 kg  Age: 69 y.o. Scr: 1.21, 03/28/2019 CrCl: 92.06 ml/min    Prescription refill sent.

## 2019-10-07 ENCOUNTER — Other Ambulatory Visit: Payer: Self-pay | Admitting: Physician Assistant

## 2019-10-20 ENCOUNTER — Other Ambulatory Visit: Payer: Self-pay | Admitting: Physician Assistant

## 2019-10-27 ENCOUNTER — Other Ambulatory Visit: Payer: Self-pay

## 2019-10-27 ENCOUNTER — Encounter: Payer: Self-pay | Admitting: Physical Therapy

## 2019-10-27 ENCOUNTER — Ambulatory Visit: Payer: Medicare Other | Attending: Physician Assistant | Admitting: Physical Therapy

## 2019-10-27 DIAGNOSIS — M25561 Pain in right knee: Secondary | ICD-10-CM | POA: Insufficient documentation

## 2019-10-27 DIAGNOSIS — M25562 Pain in left knee: Secondary | ICD-10-CM | POA: Diagnosis present

## 2019-10-27 DIAGNOSIS — M6281 Muscle weakness (generalized): Secondary | ICD-10-CM | POA: Diagnosis present

## 2019-10-27 DIAGNOSIS — G8929 Other chronic pain: Secondary | ICD-10-CM | POA: Diagnosis present

## 2019-10-27 NOTE — Therapy (Signed)
[place], [place], [address] Phone: [phone]   Fax:  [phone]  Physical Therapy Evaluation  Patient Details  Name: [name] MRN: [id number] Date of Birth: June 02, 1950 Referring Provider (PT): Laurine Blazer   Encounter Date: 10/27/2019  PT End of Session - 10/27/19 0854    Visit Number  1    Date for PT Re-Evaluation  12/27/19    PT Start Time  0800    PT Stop Time  0845    PT Time Calculation (min)  45 min    Activity Tolerance  Patient tolerated treatment well    Behavior During Therapy  [facility] for tasks assessed/performed       Past Medical History:  Diagnosis Date  . Arthritis   . Asthma   . Blood transfusion    hx of last one in 1987  . Breast cancer ([place])   . Cancer of lower-inner quadrant of female breast ([place]) 07/24/2011   right breast cancer /IDC,stage IIB,er/pr=+,her2=Neg  . Glaucoma   . Heart murmur   . History of chemotherapy    taxotere/cytoxan 6 cycles 09/22/11-01/05/12 day 2 neulasta  . Hyperlipemia   . Hypertension   . Lymphedema    right arm wears a sleeve  . MRSA (methicillin resistant staph aureus) culture positive    08/2011  . Neuromuscular disorder ([place])    peripheral neuropathy hands feet  . OSA (obstructive sleep apnea)    CPAP settings- ?   . Persistent atrial fibrillation ([place])   . Pituitary abnormality ([place]) 05-25-13   small pituitary growth-appears stable- Dr. Christella Noa follows    Past Surgical History:  Procedure Laterality Date  . ABDOMINAL HYSTERECTOMY  2007   TAH/BSO  . BREAST SURGERY     mastectomy RIGHT  . CARDIOVERSION N/A 06/02/2017   Procedure: CARDIOVERSION;  Surgeon: Dorothy Spark, MD;  Location: [facility];  Service: Cardiovascular;  Laterality: N/A;  . CARPAL TUNNEL RELEASE Right    2012  . CHOLECYSTECTOMY  1987   open - Dr Lindon Romp  . COLONOSCOPY N/A 06/13/2013   Procedure: COLONOSCOPY;  Surgeon: Juanita Craver, MD;  [facility];  Service: Endoscopy;  Laterality: N/A;  . DILATION AND CURETTAGE OF UTERUS     x3  . EYE SURGERY     laser eye surgery for glaucoma  . MASTECTOMY MODIFIED RADICAL  08/14/11   right , ER/PR +, HER2 -  . port-a-cath insertion     port-a-cath removal 1'14  . PORT-A-CATH REMOVAL  06/22/2012   Procedure: MINOR REMOVAL PORT-A-CATH;  Surgeon: Haywood Lasso, MD;  Location: [facility];  Service: General;  Laterality: Left;  . PORTACATH PLACEMENT  09/11/2011   Procedure: INSERTION PORT-A-CATH;  Surgeon: Joyice Faster. Cornett, MD;  [facility];  Service: General;  Laterality: N/A;  Insert of Port  . TUBAL LIGATION      There were no vitals filed for this visit.   Subjective Assessment - 10/27/19 0759    Subjective  Pt reports chronic B knee pain L>R. Pt reports having a corisone injection ~4 months ago and then again ~2 weeks ago which has helped some. Pt has had knee pain for years but it began worsening last year with inactivity d/t COVID; pt reports stopped going to the [facility]. Pt reports pain as worse in the morning and better as the day progresses.    Pertinent History  afib (controlled by meds), asthma, HTN  Limitations  Standing;Walking;Lifting;House hold activities    How long can you stand comfortably?  <1 hour    How long can you walk comfortably?  <1 hour    Patient Stated Goals  get rid of pain in B knees, move better    Currently in Pain?  Yes    Pain Score  7     Pain Location  Knee    Pain Orientation  Left;Right    Pain Descriptors / Indicators  Aching;Dull;Nagging    Pain Type  Chronic pain    Pain Radiating Towards  n/a    Pain Onset  More than a month ago    Pain Frequency  Intermittent    Aggravating Factors   walking, standing, lifting    Pain Relieving Factors  cortisone injection, ice, rest, tylenol         [facility] PT Assessment - 10/27/19 0001      Assessment   Medical Diagnosis  B knee OA    Referring Provider (PT)  Joslyn Devon Haus     Prior Therapy  None      Precautions   Precautions  None      Restrictions   Weight Bearing Restrictions  No      Balance Screen   Has the patient fallen in the past 6 months  No    Has the patient had a decrease in activity level because of a fear of falling?   Yes    Is the patient reluctant to leave their home because of a fear of falling?   Yes      Home Environment   Additional Comments  2 stairs no HR; pt reports very fearful of falling on stairs. 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      Observation/Other Assessments-Edema    Edema  Circumferential   some pitting edema BLE; pt reports lymphedema from breast cx     Circumferential Edema   Circumferential - Right  61 cm mid patella    Circumferential - Left   57 cm mid patella      Sensation   Light Touch  Appears Intact      Functional Tests   Functional tests  Sit to Stand      Sit to Stand   Comments  needs UE support to stand; difficulty with eccentric control      ROM / Strength   AROM / PROM / Strength  Strength;AROM      AROM   Overall AROM Comments  DF -10 B      Strength   Overall Strength Comments  BLE strength 4/5 globally except 4-/5 hip abd/ext      Palpation   Palpation comment  tender to palpation B patellar tendon, medial joint line      Special Tests    Special Tests  --      Transfers   Five time sit to stand comments   20 seconds with fatigue and difficulty achieving upright position; difficulty with eccentric control      Ambulation/Gait   Ambulation/Gait  Yes    Ambulation/Gait Assistance  6: Modified independent (Device/Increase time)    Ambulation Distance (Feet)  50 Feet  Assistive device  Straight cane    Gait Pattern  Step-to pattern;Decreased step  length - right;Decreased step length - left;Decreased hip/knee flexion - right;Decreased hip/knee flexion - left;Decreased dorsiflexion - right;Decreased dorsiflexion - left;Shuffle;Lateral trunk lean to right;Lateral trunk lean to left;Wide base of support    Ambulation Surface  Level    Gait velocity  diminished    Gait Comments  wide BOS, poor foot clearance, limited hip/knee flexion                  Objective measurements completed on examination: See above findings.      [facility] Adult PT Treatment/Exercise - 10/27/19 0001      Exercises   Exercises  Knee/Hip      Knee/Hip Exercises: Stretches   Gastroc Stretch  Both;1 rep;30 seconds    Gastroc Stretch Limitations  seated with strap/towel      Knee/Hip Exercises: Seated   Long Arc Quad  Strengthening;Both;1 set;10 reps    Marching  Strengthening;AROM;Both;1 set;10 reps    Abduction/Adduction   Both;1 set;10 reps    Abd/Adduction Limitations  green TB seated    Sit to [employer]  1 set;10 reps;with UE support             PT Education - 10/27/19 0853    Education Details  Pt educated on condition, rehab process, and HEP    Person(s) Educated  Patient    Methods  Explanation;Demonstration;Handout    Comprehension  Verbalized understanding;Returned demonstration       PT Short Term Goals - 10/27/19 0901      PT SHORT TERM GOAL #1   Title  Pt will be independent with HEP    Time  2    Period  Weeks    Status  New    Target Date  11/10/19        PT Long Term Goals - 10/27/19 0902      PT LONG TERM GOAL #1   Title  Pt will demonstrate TUG time <15 seconds with LRAD    Time  8    Target Date  12/22/19      PT LONG TERM GOAL #2   Title  Pt will demonstrate B hip abd/ext strength 4+/5    Baseline  4-/5    Time  8    Period  Weeks    Status  New    Target Date  12/22/19      PT LONG TERM GOAL #3   Title  Pt will demonstrate 10 STS from chair with no UE support    Time  8    Status  New    Target  Date  12/22/19      PT LONG TERM GOAL #4   Title  Pt will ascend/descend 2 stairs with no HR and supervision assist in order to be able to access home safely    Time  8    Period  Weeks    Status  New    Target Date  12/22/19      PT LONG TERM GOAL #5   Title  Pt will ambulate >556f over level and unlevel surfaces with no LOB or increase in knee pain    Time  8    Status  New    Target Date  12/22/19             Plan - 10/27/19 0854    Clinical Impression Statement  Pt presents to clinic with reports  of chronic B knee pain worsening since ceasing activity at [facility] last year. Pt demonstrates ROM deficits limited by body habitus, BLE strength deficits, tenderness to palpation B patellar tendon and medial joint line, increased circumference of R knee compared to L, and endurance deficits. Pt would benefit from skilled PT to address the above impairments.    Personal Factors and Comorbidities  Age;Comorbidity 3+;Time since onset of injury/illness/exacerbation;Fitness    Comorbidities  HTN, asthma, afib    Examination-Activity Limitations  Bend;Lift;Stand;Stairs;Squat;Locomotion Level;Transfers    Examination-Participation Restrictions  Church;Cleaning;Community Activity;Volunteer    Stability/Clinical Decision Making  Stable/Uncomplicated    Clinical Decision Making  Low    Rehab Potential  Good    PT Frequency  2x / week    PT Duration  8 weeks    PT Treatment/Interventions  ADLs/Self Care Home Management;Electrical Stimulation;Cryotherapy;Iontophoresis 5m/ml Dexamethasone;Moist Heat;Ultrasound;Neuromuscular re-education;Balance training;Therapeutic exercise;Therapeutic activities;Functional mobility training;Stair training;Gait training;Patient/family education;Manual techniques;Passive range of motion;Taping;Vasopneumatic Device    PT Next Visit Plan  Review HEP, Initiate LE strengthening/flexibility and endurance ex's, stair progression    PT Home Exercise Plan  STS w/ UE  support, gastroc stretch, seated marches, seated hip abd w/ green TB, LAQ    Consulted and Agree with Plan of Care  Patient       Patient will benefit from skilled therapeutic intervention in order to improve the following deficits and impairments:  Abnormal gait, Decreased range of motion, Difficulty walking, Decreased endurance, Obesity, Decreased activity tolerance, Pain, Decreased balance, Hypomobility, Improper body mechanics, Impaired flexibility, Decreased strength, Increased edema, Postural dysfunction  Visit Diagnosis: Muscle weakness (generalized)  Chronic pain of left knee  Chronic pain of right knee     Problem List Patient Active Problem List   Diagnosis Date Noted  . Chronic atrial fibrillation ([place]   . Osteopenia 02/15/2016  . Obesity 01/19/2014  . Neuromuscular disorder ([place]   . Breast cancer of lower-inner quadrant of right female breast ([place] 07/24/2011  . Murmur 06/17/2011  . Mixed hyperlipidemia 06/17/2011  . Obstructive sleep apnea 11/16/2008  . Essential hypertension 11/16/2008   AAmador Cunas PT, DPT ADonald ProseSugg 10/27/2019, 9:11 AM  [address] [address]Phone: [phone]  Fax:  [phone] Name: Kathryn FOULKESMRN: [id number] of Birth: [birth year]

## 2019-10-27 NOTE — Patient Instructions (Signed)
Access Code: [facility] URL: [email or web address] Date: 10/27/2019 Prepared by: Amador Cunas  Exercises Sit to Stand with Hands on Knees - 1 x daily - 7 x weekly - 3 sets - 10 reps - 3 sec hold Seated March - 1 x daily - 7 x weekly - 10 reps - 3 sets Seated Long Arc Quad - 1 x daily - 7 x weekly - 10 reps - 3 sets Seated Hip Abduction with Resistance - 1 x daily - 7 x weekly - 10 reps - 3 sets Seated Gastroc Stretch with Strap - 1 x daily - 7 x weekly - 3 sets - 2 reps - 30 sec hold

## 2019-11-02 ENCOUNTER — Other Ambulatory Visit: Payer: Self-pay

## 2019-11-02 ENCOUNTER — Encounter: Payer: Self-pay | Admitting: Physical Therapy

## 2019-11-02 ENCOUNTER — Ambulatory Visit: Payer: Medicare Other | Admitting: Physical Therapy

## 2019-11-02 DIAGNOSIS — G8929 Other chronic pain: Secondary | ICD-10-CM

## 2019-11-02 DIAGNOSIS — M6281 Muscle weakness (generalized): Secondary | ICD-10-CM | POA: Diagnosis not present

## 2019-11-02 DIAGNOSIS — M25562 Pain in left knee: Secondary | ICD-10-CM

## 2019-11-02 NOTE — Therapy (Signed)
[place], [place], [address] Phone: [phone]   Fax:  [phone]  Physical Therapy Treatment  Patient Details  Name: [name] MRN: [id number] Date of Birth: 1950/10/24 Referring Provider (PT): Laurine Blazer   Encounter Date: 11/02/2019  PT End of Session - 11/02/19 0841    Visit Number  2    Date for PT Re-Evaluation  12/27/19    PT Start Time  0805    PT Stop Time  0844    PT Time Calculation (min)  39 min    Activity Tolerance  Patient tolerated treatment well    Behavior During Therapy  [facility] for tasks assessed/performed       Past Medical History:  Diagnosis Date  . Arthritis   . Asthma   . Blood transfusion    hx of last one in 1987  . Breast cancer ([place])   . Cancer of lower-inner quadrant of female breast ([place]) 07/24/2011   right breast cancer /IDC,stage IIB,er/pr=+,her2=Neg  . Glaucoma   . Heart murmur   . History of chemotherapy    taxotere/cytoxan 6 cycles 09/22/11-01/05/12 day 2 neulasta  . Hyperlipemia   . Hypertension   . Lymphedema    right arm wears a sleeve  . MRSA (methicillin resistant staph aureus) culture positive    08/2011  . Neuromuscular disorder ([place])    peripheral neuropathy hands feet  . OSA (obstructive sleep apnea)    CPAP settings- ?   . Persistent atrial fibrillation ([place])   . Pituitary abnormality ([place]) 05-25-13   small pituitary growth-appears stable- Dr. Christella Noa follows    Past Surgical History:  Procedure Laterality Date  . ABDOMINAL HYSTERECTOMY  2007   TAH/BSO  . BREAST SURGERY     mastectomy RIGHT  . CARDIOVERSION N/A 06/02/2017   Procedure: CARDIOVERSION;  Surgeon: Dorothy Spark, MD;  Location: [facility];  Service: Cardiovascular;  Laterality: N/A;  . CARPAL TUNNEL RELEASE Right    2012  . CHOLECYSTECTOMY  1987   open - Dr Lindon Romp  . COLONOSCOPY N/A 06/13/2013   Procedure: COLONOSCOPY;  Surgeon: Juanita Craver, MD;  [facility];  Service: Endoscopy;  Laterality: N/A;  . DILATION AND CURETTAGE OF UTERUS     x3  . EYE SURGERY     laser eye surgery for glaucoma  . MASTECTOMY MODIFIED RADICAL  08/14/11   right , ER/PR +, HER2 -  . port-a-cath insertion     port-a-cath removal 1'14  . PORT-A-CATH REMOVAL  06/22/2012   Procedure: MINOR REMOVAL PORT-A-CATH;  Surgeon: Haywood Lasso, MD;  Location: [facility];  Service: General;  Laterality: Left;  . PORTACATH PLACEMENT  09/11/2011   Procedure: INSERTION PORT-A-CATH;  Surgeon: Joyice Faster. Cornett, MD;  [facility];  Service: General;  Laterality: N/A;  Insert of Port  . TUBAL LIGATION      There were no vitals filed for this visit.  Subjective Assessment - 11/02/19 0809    Subjective  "Pretty good" Pain in the L knee    Currently in Pain?  Yes    Pain Score  5     Pain Location  Knee    Pain Orientation  Left                        [facility] Adult PT Treatment/Exercise - 11/02/19 0001      Knee/Hip Exercises: Aerobic  Nustep  L3 x 6 min       Knee/Hip Exercises: Standing   Hip Abduction  Both;2 sets;10 reps;Knee straight;Stengthening    Other Standing Knee Exercises  Standing march with SPC 2x10     Other Standing Knee Exercises  Alb box taps HHA x 2       Knee/Hip Exercises: Seated   Long Arc Quad  Strengthening;Both;10 reps;2 sets;Weights    Long Arc Quad Weight  2 lbs.    Marching  Both;2 sets;10 reps;Strengthening    Hamstring Curl  Both;2 sets;10 reps    Hamstring Limitations  red tband    Sit to Sand  2 sets;10 reps;without UE support               PT Short Term Goals - 10/27/19 0901      PT SHORT TERM GOAL #1   Title  Pt will be independent with HEP    Time  2    Period  Weeks    Status  New    Target Date  11/10/19        PT Long Term Goals - 10/27/19 0902      PT LONG TERM GOAL #1   Title  Pt will demonstrate TUG time <15 seconds with LRAD    Time  8    Target Date  12/22/19       PT LONG TERM GOAL #2   Title  Pt will demonstrate B hip abd/ext strength 4+/5    Baseline  4-/5    Time  8    Period  Weeks    Status  New    Target Date  12/22/19      PT LONG TERM GOAL #3   Title  Pt will demonstrate 10 STS from chair with no UE support    Time  8    Status  New    Target Date  12/22/19      PT LONG TERM GOAL #4   Title  Pt will ascend/descend 2 stairs with no HR and supervision assist in order to be able to access home safely    Time  8    Period  Weeks    Status  New    Target Date  12/22/19      PT LONG TERM GOAL #5   Title  Pt will ambulate >539f over level and unlevel surfaces with no LOB or increase in knee pain    Time  8    Status  New    Target Date  12/22/19            Plan - 11/02/19 0842    Clinical Impression Statement  Pt enters clinic ambulating with SPC to heigh, PTA adjusted SPC so the correct position. Pt did well overall with today's interventions. She does have some signs of fatigue. Bo reports of pain. Difficulty noted with seated marches. Pt stated HS curls relieved some of the pain Pt ambulated with a rigid RLE.    Personal Factors and Comorbidities  Age;Comorbidity 3+;Time since onset of injury/illness/exacerbation;Fitness    Examination-Activity Limitations  Bend;Lift;Stand;Stairs;Squat;Locomotion Level;Transfers    Examination-Participation Restrictions  Church;Cleaning;Community Activity;Volunteer    Stability/Clinical Decision Making  Stable/Uncomplicated    Rehab Potential  Good    PT Frequency  2x / week    PT Duration  8 weeks    PT Treatment/Interventions  ADLs/Self Care Home Management;Electrical Stimulation;Cryotherapy;Iontophoresis 430mml Dexamethasone;Moist Heat;Ultrasound;Neuromuscular re-education;Balance training;Therapeutic exercise;Therapeutic activities;Functional mobility training;Stair training;Gait training;Patient/family education;Manual  techniques;Passive range of motion;Taping;Vasopneumatic Device     PT Next Visit Plan  LE strengthening/flexibility and endurance ex's, stair progression       Patient will benefit from skilled therapeutic intervention in order to improve the following deficits and impairments:     Visit Diagnosis: Chronic pain of left knee  Muscle weakness (generalized)  Chronic pain of right knee     Problem List Patient Active Problem List   Diagnosis Date Noted  . Chronic atrial fibrillation ([place])   . Osteopenia 02/15/2016  . Obesity 01/19/2014  . Neuromuscular disorder ([place])   . Breast cancer of lower-inner quadrant of right female breast ([place]) 07/24/2011  . Murmur 06/17/2011  . Mixed hyperlipidemia 06/17/2011  . Obstructive sleep apnea 11/16/2008  . Essential hypertension 11/16/2008    Scot Jun 11/02/2019, 8:44 AM  [place], [place], [address] Phone: [phone]   Fax:  [phone]  Name: [name] MRN: [id number] Date of Birth: 03/09/1951

## 2019-11-04 ENCOUNTER — Ambulatory Visit: Payer: Medicare Other | Admitting: Physical Therapy

## 2019-11-04 ENCOUNTER — Other Ambulatory Visit: Payer: Self-pay

## 2019-11-04 ENCOUNTER — Encounter: Payer: Self-pay | Admitting: Physical Therapy

## 2019-11-04 DIAGNOSIS — M6281 Muscle weakness (generalized): Secondary | ICD-10-CM | POA: Diagnosis not present

## 2019-11-04 DIAGNOSIS — M25561 Pain in right knee: Secondary | ICD-10-CM

## 2019-11-04 DIAGNOSIS — G8929 Other chronic pain: Secondary | ICD-10-CM

## 2019-11-04 NOTE — Therapy (Signed)
[place], [place], [address] Phone: [phone]   Fax:  [phone]  Physical Therapy Treatment  Patient Details  Name: [name] MRN: [id number] Date of Birth: 1951-05-04 Referring Provider (PT): [name]   Encounter Date: 11/04/2019   PT End of Session - 11/04/19 0842    Visit Number 3    Date for PT Re-Evaluation 12/27/19    PT Start Time 0805    PT Stop Time 0845    PT Time Calculation (min) 40 min    Activity Tolerance Patient tolerated treatment well    Behavior During Therapy [name] for tasks assessed/performed           Past Medical History:  Diagnosis Date  . Arthritis   . Asthma   . Blood transfusion    hx of last one in 1987  . Breast cancer ([name])   . Cancer of lower-inner quadrant of female breast ([name]) 07/24/2011   right breast cancer /IDC,stage IIB,er/pr=+,her2=Neg  . Glaucoma   . Heart murmur   . History of chemotherapy    taxotere/cytoxan 6 cycles 09/22/11-01/05/12 day 2 neulasta  . Hyperlipemia   . Hypertension   . Lymphedema    right arm wears a sleeve  . MRSA (methicillin resistant staph aureus) culture positive    08/2011  . Neuromuscular disorder ([name])    peripheral neuropathy hands feet  . OSA (obstructive sleep apnea)    CPAP settings- ?   . Persistent atrial fibrillation ([name])   . Pituitary abnormality ([name]) 05-25-13   small pituitary growth-appears stable- [name] follows    Past Surgical History:  Procedure Laterality Date  . ABDOMINAL HYSTERECTOMY  2007   TAH/BSO  . BREAST SURGERY     mastectomy RIGHT  . CARDIOVERSION N/A 06/02/2017   Procedure: CARDIOVERSION;  Surgeon: Kathryn Spark, MD;  Location: [place];  Service: Cardiovascular;  Laterality: N/A;  . CARPAL TUNNEL RELEASE Right    2012  . CHOLECYSTECTOMY  1987   open - Dr [name]  . COLONOSCOPY N/A 06/13/2013   Procedure: COLONOSCOPY;  Surgeon: Kathryn Craver, MD;  Location: [name];  Service: [name];  Laterality: N/A;  . DILATION AND CURETTAGE OF UTERUS     x3  . EYE SURGERY     laser eye surgery for glaucoma  . MASTECTOMY MODIFIED RADICAL  08/14/11   right , ER/PR +, HER2 -  . port-a-cath insertion     port-a-cath removal 1'14  . PORT-A-CATH REMOVAL  06/22/2012   Procedure: MINOR REMOVAL PORT-A-CATH;  Surgeon: Kathryn Lasso, MD;  Location: [facility];  Service: General;  Laterality: Left;  . PORTACATH PLACEMENT  09/11/2011   Procedure: INSERTION PORT-A-CATH;  Surgeon: Kathryn Faster. Cornett, MD;  Location: [name];  Service: General;  Laterality: N/A;  Insert of Port  . TUBAL LIGATION      There were no vitals filed for this visit.   Subjective Assessment - 11/04/19 0811    Subjective "Good"    Currently in Pain? Yes    Pain Score 5     Pain Location Knee    Pain Orientation Left                             [facility] Adult PT Treatment/Exercise - 11/04/19 0001      High Level Balance   High Level Balance Activities Side stepping;Backward walking  High Level Balance Comments Alt 4in box tap with SPC 2x10       Knee/Hip Exercises: Aerobic   Nustep L3 x 7 min       Knee/Hip Exercises: Seated   Long Arc Quad Strengthening;Both;2 sets;Weights;15 reps;10 reps    Long Arc Quad Weight 2 lbs.    Hamstring Curl Both;2 sets;10 reps    Hamstring Limitations green Tband                     PT Short Term Goals - 11/04/19 0850      PT SHORT TERM GOAL #1   Title Pt will be independent with HEP    Status Achieved             PT Long Term Goals - 11/04/19 0850      PT LONG TERM GOAL #1   Title Pt will demonstrate TUG time <15 seconds with LRAD    Status On-going      PT LONG TERM GOAL #2   Title Pt will demonstrate B hip abd/ext strength 4+/5      PT LONG TERM GOAL #3   Status On-going                 Plan - 11/04/19 0846    Clinical Impression Statement Pt able to progress to some  additional balance activities today. Some clearance issues with the 4 in step resulting to pt kicking box at times. Short stride length noted with backwards walking. She reports more difficulty going to her R side with side steps. Cues to stand full erect with sit to stand.    Personal Factors and Comorbidities Age;Comorbidity 3+;Time since onset of injury/illness/exacerbation;Fitness    Examination-Activity Limitations Bend;Lift;Stand;Stairs;Squat;Locomotion Level;Transfers    Examination-Participation Restrictions Church;Cleaning;Community Activity;Volunteer    Stability/Clinical Decision Making Stable/Uncomplicated    Rehab Potential Good    PT Frequency 2x / week    PT Duration 8 weeks    PT Treatment/Interventions ADLs/Self Care Home Management;Electrical Stimulation;Cryotherapy;Iontophoresis 40m/ml Dexamethasone;Moist Heat;Ultrasound;Neuromuscular re-education;Balance training;Therapeutic exercise;Therapeutic activities;Functional mobility training;Stair training;Gait training;Patient/family education;Manual techniques;Passive range of motion;Taping;Vasopneumatic Device    PT Next Visit Plan LE strengthening/flexibility and endurance ex's, stair progression           Patient will benefit from skilled therapeutic intervention in order to improve the following deficits and impairments:  Abnormal gait, Decreased range of motion, Difficulty walking, Decreased endurance, Obesity, Decreased activity tolerance, Pain, Decreased balance, Hypomobility, Improper body mechanics, Impaired flexibility, Decreased strength, Increased edema, Postural dysfunction  Visit Diagnosis: Muscle weakness (generalized)  Chronic pain of right knee  Chronic pain of left knee     Problem List Patient Active Problem List   Diagnosis Date Noted  . Chronic atrial fibrillation ([name]   . Osteopenia 02/15/2016  . Obesity 01/19/2014  . Neuromuscular disorder ([place]   . Breast cancer of lower-inner quadrant of  right female breast ([place] 07/24/2011  . Murmur 06/17/2011  . Mixed hyperlipidemia 06/17/2011  . Obstructive sleep apnea 11/16/2008  . Essential hypertension 11/16/2008    RScot Jun PTA 11/04/2019, 8:50 AM  [place] [address]Phone: [phone]  Fax:  [phone] Name: Kathryn PANGILINANMRN: [id number] of Birth: 7Jan 13, 1952

## 2019-11-08 ENCOUNTER — Ambulatory Visit: Payer: Medicare Other | Admitting: Physical Therapy

## 2019-11-10 ENCOUNTER — Other Ambulatory Visit: Payer: Self-pay

## 2019-11-10 ENCOUNTER — Ambulatory Visit
Admission: RE | Admit: 2019-11-10 | Discharge: 2019-11-10 | Disposition: A | Discharge status: Home / Self Care | Payer: Medicare Other | Source: Ambulatory Visit | Attending: Hematology | Admitting: Hematology

## 2019-11-10 DIAGNOSIS — Z1231 Encounter for screening mammogram for malignant neoplasm of breast: Secondary | ICD-10-CM

## 2019-11-15 ENCOUNTER — Ambulatory Visit: Payer: Medicare Other | Admitting: Physical Therapy

## 2019-11-15 ENCOUNTER — Other Ambulatory Visit: Payer: Self-pay

## 2019-11-15 ENCOUNTER — Encounter: Payer: Self-pay | Admitting: Physical Therapy

## 2019-11-15 DIAGNOSIS — M25562 Pain in left knee: Secondary | ICD-10-CM

## 2019-11-15 DIAGNOSIS — M6281 Muscle weakness (generalized): Secondary | ICD-10-CM | POA: Diagnosis not present

## 2019-11-15 DIAGNOSIS — G8929 Other chronic pain: Secondary | ICD-10-CM

## 2019-11-15 NOTE — Therapy (Signed)
[place], [place], [address] Phone: [phone]   Fax:  [phone]  Physical Therapy Treatment  Patient Details  Name: [name] MRN: [id number] Date of Birth: [birth year] Referring Provider (PT): Laurine Blazer   Encounter Date: 11/15/2019   PT End of Session - 11/15/19 0843    Visit Number 4    Date for PT Re-Evaluation 12/27/19    PT Start Time 0804    PT Stop Time 0844    PT Time Calculation (min) 40 min    Activity Tolerance Patient tolerated treatment well    Behavior During Therapy [facility] for tasks assessed/performed           Past Medical History:  Diagnosis Date  . Arthritis   . Asthma   . Blood transfusion    hx of last one in 1987  . Breast cancer [facility]) 2013   Right Breast Cancer  . Cancer of lower-inner quadrant of female breast ([place]) 07/24/2011   right breast cancer /IDC,stage IIB,er/pr=+,her2=Neg  . Glaucoma   . Heart murmur   . History of chemotherapy    taxotere/cytoxan 6 cycles 09/22/11-01/05/12 day 2 neulasta  . Hyperlipemia   . Hypertension   . Lymphedema    right arm wears a sleeve  . MRSA (methicillin resistant staph aureus) culture positive    08/2011  . Neuromuscular disorder ([place])    peripheral neuropathy hands feet  . OSA (obstructive sleep apnea)    CPAP settings- ?   . Persistent atrial fibrillation ([place])   . Pituitary abnormality ([place]) 05-25-13   small pituitary growth-appears stable- Dr. Christella Noa follows    Past Surgical History:  Procedure Laterality Date  . ABDOMINAL HYSTERECTOMY  2007   TAH/BSO  . BREAST SURGERY     mastectomy RIGHT  . CARDIOVERSION N/A 06/02/2017   Procedure: CARDIOVERSION;  Surgeon: Dorothy Spark, MD;  Location: [facility];  Service: Cardiovascular;  Laterality: N/A;  . CARPAL TUNNEL RELEASE Right    2012  . CHOLECYSTECTOMY  1987   open - Dr Lindon Romp  . COLONOSCOPY N/A 06/13/2013   Procedure: COLONOSCOPY;  Surgeon:  Juanita Craver, MD;  [facility];  Service: Endoscopy;  Laterality: N/A;  . DILATION AND CURETTAGE OF UTERUS     x3  . EYE SURGERY     laser eye surgery for glaucoma  . MASTECTOMY MODIFIED RADICAL Right 08/14/11   right , ER/PR +, HER2 -  . port-a-cath insertion     port-a-cath removal 1'14  . PORT-A-CATH REMOVAL  06/22/2012   Procedure: MINOR REMOVAL PORT-A-CATH;  Surgeon: Haywood Lasso, MD;  Location: [facility];  Service: General;  Laterality: Left;  . PORTACATH PLACEMENT  09/11/2011   Procedure: INSERTION PORT-A-CATH;  Surgeon: Joyice Faster. Cornett, MD;  [facility];  Service: General;  Laterality: N/A;  Insert of Port  . TUBAL LIGATION      There were no vitals filed for this visit.   Subjective Assessment - 11/15/19 0810    Subjective Pt states she "stepped wrong" getting to the car this morning and knees are a little sore but she's doing better overall    Currently in Pain? Yes    Pain Score 6     Pain Location Knee    Pain Orientation Left  [facility] Adult PT Treatment/Exercise - 11/15/19 0001      Knee/Hip Exercises: Stretches   Active Hamstring Stretch Both;1 rep;30 seconds    Gastroc Stretch Both;1 rep;30 seconds      Knee/Hip Exercises: Aerobic   Nustep L3 x 7 min      Knee/Hip Exercises: Standing   Heel Raises Both;1 set;15 reps    Other Standing Knee Exercises standing abduction/extension/knee flexion at counter with 2# ankle weight    Other Standing Knee Exercises alt box taps HHA x1 with 4" box      Knee/Hip Exercises: Seated   Long Arc Quad Both;2 sets;10 reps    Long Arc Quad Weight 2 lbs.    Other Seated Knee/Hip Exercises seated heel/toe raises x15    Marching Both;2 sets;10 reps;Strengthening    Marching Weights 2 lbs.    Sit to Sand 2 sets;10 reps;without UE support                    PT Short Term Goals - 11/04/19 0850      PT SHORT TERM GOAL #1   Title Pt will be  independent with HEP    Status Achieved             PT Long Term Goals - 11/04/19 0850      PT LONG TERM GOAL #1   Title Pt will demonstrate TUG time <15 seconds with LRAD    Status On-going      PT LONG TERM GOAL #2   Title Pt will demonstrate B hip abd/ext strength 4+/5      PT LONG TERM GOAL #3   Status On-going                 Plan - 11/15/19 0844    Clinical Impression Statement Pt demonstrates slightly increased endurance; still requiring frequent rest breaks d/t SOB with exercise. Pt able to completel box taps with HHA +1; however, some difficulty with foot clearance. Pt demonstrates compensations with standing hip ex's. Pt reports relief of knee pain with stretching. Continue to progress strength/endurance.    PT Treatment/Interventions ADLs/Self Care Home Management;Electrical Stimulation;Cryotherapy;Iontophoresis 31m/ml Dexamethasone;Moist Heat;Ultrasound;Neuromuscular re-education;Balance training;Therapeutic exercise;Therapeutic activities;Functional mobility training;Stair training;Gait training;Patient/family education;Manual techniques;Passive range of motion;Taping;Vasopneumatic Device    PT Next Visit Plan LE strengthening/flexibility and endurance ex's, stair progression    Consulted and Agree with Plan of Care Patient           Patient will benefit from skilled therapeutic intervention in order to improve the following deficits and impairments:  Abnormal gait, Decreased range of motion, Difficulty walking, Decreased endurance, Obesity, Decreased activity tolerance, Pain, Decreased balance, Hypomobility, Improper body mechanics, Impaired flexibility, Decreased strength, Increased edema, Postural dysfunction  Visit Diagnosis: Muscle weakness (generalized)  Chronic pain of right knee  Chronic pain of left knee     Problem List Patient Active Problem List   Diagnosis Date Noted  . Chronic atrial fibrillation ([place]   . Osteopenia 02/15/2016  .  Obesity 01/19/2014  . Neuromuscular disorder ([place]   . Breast cancer of lower-inner quadrant of right female breast ([place] 07/24/2011  . Murmur 06/17/2011  . Mixed hyperlipidemia 06/17/2011  . Obstructive sleep apnea 11/16/2008  . Essential hypertension 11/16/2008   AAmador Cunas PT, DPT ADonald ProseSugg 11/15/2019, 8:49 AM  CP[facility] CityBCornwells HeightsSuite [address] [address]Phone: [phone]  Fax:  [phone] Name: Kathryn LADOUCEURMRN: [id number]  of Birth: 27-Sep-1950

## 2019-11-18 ENCOUNTER — Other Ambulatory Visit: Payer: Self-pay

## 2019-11-18 ENCOUNTER — Encounter: Payer: Self-pay | Admitting: Physical Therapy

## 2019-11-18 ENCOUNTER — Ambulatory Visit: Payer: Medicare Other | Admitting: Physical Therapy

## 2019-11-18 DIAGNOSIS — M6281 Muscle weakness (generalized): Secondary | ICD-10-CM

## 2019-11-18 DIAGNOSIS — G8929 Other chronic pain: Secondary | ICD-10-CM

## 2019-11-18 NOTE — Therapy (Signed)
[place], [place], [address] Phone: [phone]   Fax:  [phone]  Physical Therapy Treatment  Patient Details  Name: [name] MRN: [id number] Date of Birth: Oct 18, 1950 Referring Provider (PT): Laurine Blazer   Encounter Date: 11/18/2019   PT End of Session - 11/18/19 0840    Visit Number 5    Date for PT Re-Evaluation 12/27/19    PT Start Time 0805    PT Stop Time 0845    PT Time Calculation (min) 40 min    Activity Tolerance Patient tolerated treatment well    Behavior During Therapy [facility] for tasks assessed/performed           Past Medical History:  Diagnosis Date  . Arthritis   . Asthma   . Blood transfusion    hx of last one in 1987  . Breast cancer [facility]) 2013   Right Breast Cancer  . Cancer of lower-inner quadrant of female breast ([place]) 07/24/2011   right breast cancer /IDC,stage IIB,er/pr=+,her2=Neg  . Glaucoma   . Heart murmur   . History of chemotherapy    taxotere/cytoxan 6 cycles 09/22/11-01/05/12 day 2 neulasta  . Hyperlipemia   . Hypertension   . Lymphedema    right arm wears a sleeve  . MRSA (methicillin resistant staph aureus) culture positive    08/2011  . Neuromuscular disorder ([place])    peripheral neuropathy hands feet  . OSA (obstructive sleep apnea)    CPAP settings- ?   . Persistent atrial fibrillation ([place])   . Pituitary abnormality ([place]) 05-25-13   small pituitary growth-appears stable- Dr. Christella Noa follows    Past Surgical History:  Procedure Laterality Date  . ABDOMINAL HYSTERECTOMY  2007   TAH/BSO  . BREAST SURGERY     mastectomy RIGHT  . CARDIOVERSION N/A 06/02/2017   Procedure: CARDIOVERSION;  Surgeon: Dorothy Spark, MD;  Location: [place];  Service: Cardiovascular;  Laterality: N/A;  . CARPAL TUNNEL RELEASE Right    2012  . CHOLECYSTECTOMY  1987   open - Dr Lindon Romp  . COLONOSCOPY N/A 06/13/2013   Procedure: COLONOSCOPY;  Surgeon:  Juanita Craver, MD;  [facility];  Service: Endoscopy;  Laterality: N/A;  . DILATION AND CURETTAGE OF UTERUS     x3  . EYE SURGERY     laser eye surgery for glaucoma  . MASTECTOMY MODIFIED RADICAL Right 08/14/11   right , ER/PR +, HER2 -  . port-a-cath insertion     port-a-cath removal 1'14  . PORT-A-CATH REMOVAL  06/22/2012   Procedure: MINOR REMOVAL PORT-A-CATH;  Surgeon: Haywood Lasso, MD;  Location: [place];  Service: General;  Laterality: Left;  . PORTACATH PLACEMENT  09/11/2011   Procedure: INSERTION PORT-A-CATH;  Surgeon: Joyice Faster. Cornett, MD;  [facility];  Service: General;  Laterality: N/A;  Insert of Port  . TUBAL LIGATION      There were no vitals filed for this visit.   Subjective Assessment - 11/18/19 0807    Subjective "Good" L knee is bothering her some this morning, a little stiff    Currently in Pain? Yes    Pain Score 7    "But it is manageable"   Pain Location Knee    Pain Orientation Left              [facility] PT Assessment - 11/18/19 0001      Standardized Balance Assessment   Standardized Balance  Assessment Timed Up and Go Test      Timed Up and Go Test   Normal TUG (seconds) 20.13   16.37 with SPC.                         [place] Adult PT Treatment/Exercise - 11/18/19 0001      High Level Balance   High Level Balance Activities Side stepping      Knee/Hip Exercises: Aerobic   Nustep L3 x 7 min      Knee/Hip Exercises: Machines for Strengthening   Cybex Knee Extension 5lb 2x10     Cybex Knee Flexion 20lb 2x10       Knee/Hip Exercises: Standing   Forward Step Up Both;1 set;5 reps;Hand Hold: 2;Step Height: 4"    Other Standing Knee Exercises Standing march with SPC 2x10      Knee/Hip Exercises: Seated   Sit to Sand 2 sets;10 reps;without UE support;5 reps                    PT Short Term Goals - 11/04/19 0850      PT SHORT TERM GOAL #1   Title Pt will be independent with HEP     Status Achieved             PT Long Term Goals - 11/18/19 5465      PT LONG TERM GOAL #1   Title Pt will demonstrate TUG time <15 seconds with LRAD    Status On-going                 Plan - 11/18/19 0841    Clinical Impression Statement Pt has progressed towards goals. She has decrease activity tolerance with functional activity. Some difficulty with step ups with RLE. Circumduct's RLE when stepping up with LLE. Cues not to drag RLE when side stepping to L.    Personal Factors and Comorbidities Age;Comorbidity 3+;Time since onset of injury/illness/exacerbation;Fitness    Comorbidities HTN, asthma, afib    Examination-Activity Limitations Bend;Lift;Stand;Stairs;Squat;Locomotion Level;Transfers    Examination-Participation Restrictions Church;Cleaning;Community Activity;Volunteer    Stability/Clinical Decision Making Stable/Uncomplicated    Rehab Potential Good    PT Frequency 2x / week    PT Duration 8 weeks    PT Treatment/Interventions ADLs/Self Care Home Management;Electrical Stimulation;Cryotherapy;Iontophoresis 70m/ml Dexamethasone;Moist Heat;Ultrasound;Neuromuscular re-education;Balance training;Therapeutic exercise;Therapeutic activities;Functional mobility training;Stair training;Gait training;Patient/family education;Manual techniques;Passive range of motion;Taping;Vasopneumatic Device    PT Next Visit Plan LE strengthening/flexibility and endurance ex's, stair progression           Patient will benefit from skilled therapeutic intervention in order to improve the following deficits and impairments:  Abnormal gait, Decreased range of motion, Difficulty walking, Decreased endurance, Obesity, Decreased activity tolerance, Pain, Decreased balance, Hypomobility, Improper body mechanics, Impaired flexibility, Decreased strength, Increased edema, Postural dysfunction  Visit Diagnosis: Muscle weakness (generalized)  Chronic pain of right knee  Chronic pain of left  knee     Problem List Patient Active Problem List   Diagnosis Date Noted  . Chronic atrial fibrillation ([place]   . Osteopenia 02/15/2016  . Obesity 01/19/2014  . Neuromuscular disorder ([place]   . Breast cancer of lower-inner quadrant of right female breast ([place]Ref[place] 07/24/2011  . Murmur 06/17/2011  . Mixed hyperlipidemia 06/17/2011  . Obstructive sleep apnea 11/16/2008  . Essential hypertension 11/16/2008    RScot Jun PTA 11/18/2019, 8:44 AM  [address]  [place], [address] Phone: [phone]   Fax:  [phone]  Name: [name] MRN: [id number] Date of Birth: Dec 14, 1950

## 2019-11-22 ENCOUNTER — Ambulatory Visit: Payer: Medicare Other | Admitting: Physical Therapy

## 2019-11-22 ENCOUNTER — Encounter: Payer: Self-pay | Admitting: Physical Therapy

## 2019-11-22 ENCOUNTER — Other Ambulatory Visit: Payer: Self-pay

## 2019-11-22 DIAGNOSIS — G8929 Other chronic pain: Secondary | ICD-10-CM

## 2019-11-22 DIAGNOSIS — M6281 Muscle weakness (generalized): Secondary | ICD-10-CM | POA: Diagnosis not present

## 2019-11-22 NOTE — Therapy (Signed)
[place], [place], [address] Phone: [phone]   Fax:  [phone]  Physical Therapy Treatment  Patient Details  Name: [name] MRN: [id number] Date of Birth: 1950/07/01 Referring Provider (PT): Laurine Blazer   Encounter Date: 11/22/2019   PT End of Session - 11/22/19 0841    Visit Number 6    Date for PT Re-Evaluation 12/27/19    PT Start Time 0812    PT Stop Time 0843    PT Time Calculation (min) 31 min    Activity Tolerance Patient tolerated treatment well    Behavior During Therapy [facility] for tasks assessed/performed           Past Medical History:  Diagnosis Date  . Arthritis   . Asthma   . Blood transfusion    hx of last one in 1987  . Breast cancer [facility]) 2013   Right Breast Cancer  . Cancer of lower-inner quadrant of female breast ([place]) 07/24/2011   right breast cancer /IDC,stage IIB,er/pr=+,her2=Neg  . Glaucoma   . Heart murmur   . History of chemotherapy    taxotere/cytoxan 6 cycles 09/22/11-01/05/12 day 2 neulasta  . Hyperlipemia   . Hypertension   . Lymphedema    right arm wears a sleeve  . MRSA (methicillin resistant staph aureus) culture positive    08/2011  . Neuromuscular disorder ([place])    peripheral neuropathy hands feet  . OSA (obstructive sleep apnea)    CPAP settings- ?   . Persistent atrial fibrillation ([place])   . Pituitary abnormality ([place]) 05-25-13   small pituitary growth-appears stable- Dr. Christella Noa follows    Past Surgical History:  Procedure Laterality Date  . ABDOMINAL HYSTERECTOMY  2007   TAH/BSO  . BREAST SURGERY     mastectomy RIGHT  . CARDIOVERSION N/A 06/02/2017   Procedure: CARDIOVERSION;  Surgeon: Dorothy Spark, MD;  Location: [place];  Service: Cardiovascular;  Laterality: N/A;  . CARPAL TUNNEL RELEASE Right    2012  . CHOLECYSTECTOMY  1987   open - Dr Lindon Romp  . COLONOSCOPY N/A 06/13/2013   Procedure: COLONOSCOPY;  Surgeon:  Juanita Craver, MD;  [facility];  Service: Endoscopy;  Laterality: N/A;  . DILATION AND CURETTAGE OF UTERUS     x3  . EYE SURGERY     laser eye surgery for glaucoma  . MASTECTOMY MODIFIED RADICAL Right 08/14/11   right , ER/PR +, HER2 -  . port-a-cath insertion     port-a-cath removal 1'14  . PORT-A-CATH REMOVAL  06/22/2012   Procedure: MINOR REMOVAL PORT-A-CATH;  Surgeon: Haywood Lasso, MD;  Location: [place];  Service: General;  Laterality: Left;  . PORTACATH PLACEMENT  09/11/2011   Procedure: INSERTION PORT-A-CATH;  Surgeon: Joyice Faster. Cornett, MD;  [facility];  Service: General;  Laterality: N/A;  Insert of Port  . TUBAL LIGATION      There were no vitals filed for this visit.   Subjective Assessment - 11/22/19 0815    Subjective "A little tired"    Currently in Pain? No/denies                             [facility] Adult PT Treatment/Exercise - 11/22/19 0001      High Level Balance   High Level Balance Activities Side stepping;Backward walking    High Level Balance Comments ALT taps round airex with  SPC 2x12 total       Knee/Hip Exercises: Aerobic   Nustep L5 x 5 min      Knee/Hip Exercises: Seated   Long Arc Quad Both;2 sets;10 reps    Long Arc Quad Weight 3 lbs.    Other Seated Knee/Hip Exercises Seated OHP yellow ball 2x10    Hamstring Curl Both;2 sets;10 reps    Hamstring Limitations green Tband     Sit to Sand 2 sets;without UE support;10 reps                    PT Short Term Goals - 11/04/19 0850      PT SHORT TERM GOAL #1   Title Pt will be independent with HEP    Status Achieved             PT Long Term Goals - 11/18/19 3976      PT LONG TERM GOAL #1   Title Pt will demonstrate TUG time <15 seconds with LRAD    Status On-going                 Plan - 11/22/19 0841    Clinical Impression Statement 12 minutes late. Pt with increase fatigue today. Some initial instability with alt  taps bet this improved ad pt progresses. Decrease step length with LLE during backwards walking. Went back to tband HS curls and LAQ for better ROM and compliance compared to machine level interventions.    Personal Factors and Comorbidities Age;Comorbidity 3+;Time since onset of injury/illness/exacerbation;Fitness    Comorbidities HTN, asthma, afib    Examination-Activity Limitations Bend;Lift;Stand;Stairs;Squat;Locomotion Level;Transfers    Examination-Participation Restrictions Church;Cleaning;Community Activity;Volunteer    Stability/Clinical Decision Making Stable/Uncomplicated    Rehab Potential Good    PT Frequency 2x / week    PT Duration 8 weeks    PT Treatment/Interventions ADLs/Self Care Home Management;Electrical Stimulation;Cryotherapy;Iontophoresis 75m/ml Dexamethasone;Moist Heat;Ultrasound;Neuromuscular re-education;Balance training;Therapeutic exercise;Therapeutic activities;Functional mobility training;Stair training;Gait training;Patient/family education;Manual techniques;Passive range of motion;Taping;Vasopneumatic Device    PT Next Visit Plan LE strengthening/flexibility and endurance ex's, stair progression           Patient will benefit from skilled therapeutic intervention in order to improve the following deficits and impairments:  Abnormal gait, Decreased range of motion, Difficulty walking, Decreased endurance, Obesity, Decreased activity tolerance, Pain, Decreased balance, Hypomobility, Improper body mechanics, Impaired flexibility, Decreased strength, Increased edema, Postural dysfunction  Visit Diagnosis: Muscle weakness (generalized)  Chronic pain of left knee  Chronic pain of right knee     Problem List Patient Active Problem List   Diagnosis Date Noted  . Chronic atrial fibrillation ([place]   . Osteopenia 02/15/2016  . Obesity 01/19/2014  . Neuromuscular disorder ([place]   . Breast cancer of lower-inner quadrant of right female breast ([place] 07/24/2011   . Murmur 06/17/2011  . Mixed hyperlipidemia 06/17/2011  . Obstructive sleep apnea 11/16/2008  . Essential hypertension 11/16/2008    RScot Jun PTA 11/22/2019, 8:44 AM  [place] [address]Phone: [phone]  Fax:  [phone] Name: Kathryn SUNDQUISTMRN: [id number] of Birth: 704-Oct-1952

## 2019-11-25 ENCOUNTER — Encounter: Payer: Self-pay | Admitting: Physical Therapy

## 2019-11-25 ENCOUNTER — Other Ambulatory Visit: Payer: Self-pay

## 2019-11-25 ENCOUNTER — Ambulatory Visit: Payer: Medicare Other | Attending: Family Medicine | Admitting: Physical Therapy

## 2019-11-25 DIAGNOSIS — M6281 Muscle weakness (generalized): Secondary | ICD-10-CM

## 2019-11-25 DIAGNOSIS — M25561 Pain in right knee: Secondary | ICD-10-CM | POA: Insufficient documentation

## 2019-11-25 DIAGNOSIS — G8929 Other chronic pain: Secondary | ICD-10-CM

## 2019-11-25 DIAGNOSIS — M25562 Pain in left knee: Secondary | ICD-10-CM | POA: Insufficient documentation

## 2019-11-25 NOTE — Therapy (Signed)
[place], [place], [address] Phone: [phone]   Fax:  [phone]  Physical Therapy Treatment  Patient Details  Name: [name] MRN: [id number] Date of Birth: 11/11/1950 Referring Provider (PT): Laurine Blazer   Encounter Date: 11/25/2019   PT End of Session - 11/25/19 0909    Visit Number 7    Date for PT Re-Evaluation 12/27/19    PT Start Time 0805    PT Stop Time 0845    PT Time Calculation (min) 40 min    Activity Tolerance Patient tolerated treatment well    Behavior During Therapy [facility] for tasks assessed/performed           Past Medical History:  Diagnosis Date  . Arthritis   . Asthma   . Blood transfusion    hx of last one in 1987  . Breast cancer [facility]) 2013   Right Breast Cancer  . Cancer of lower-inner quadrant of female breast ([place]) 07/24/2011   right breast cancer /IDC,stage IIB,er/pr=+,her2=Neg  . Glaucoma   . Heart murmur   . History of chemotherapy    taxotere/cytoxan 6 cycles 09/22/11-01/05/12 day 2 neulasta  . Hyperlipemia   . Hypertension   . Lymphedema    right arm wears a sleeve  . MRSA (methicillin resistant staph aureus) culture positive    08/2011  . Neuromuscular disorder ([place])    peripheral neuropathy hands feet  . OSA (obstructive sleep apnea)    CPAP settings- ?   . Persistent atrial fibrillation ([place])   . Pituitary abnormality ([place]) 05-25-13   small pituitary growth-appears stable- Dr. Christella Noa follows    Past Surgical History:  Procedure Laterality Date  . ABDOMINAL HYSTERECTOMY  2007   TAH/BSO  . BREAST SURGERY     mastectomy RIGHT  . CARDIOVERSION N/A 06/02/2017   Procedure: CARDIOVERSION;  Surgeon: Dorothy Spark, MD;  Location: [place];  Service: Cardiovascular;  Laterality: N/A;  . CARPAL TUNNEL RELEASE Right    2012  . CHOLECYSTECTOMY  1987   open - Dr Lindon Romp  . COLONOSCOPY N/A 06/13/2013   Procedure: COLONOSCOPY;  Surgeon:  Juanita Craver, MD;  [facility];  Service: Endoscopy;  Laterality: N/A;  . DILATION AND CURETTAGE OF UTERUS     x3  . EYE SURGERY     laser eye surgery for glaucoma  . MASTECTOMY MODIFIED RADICAL Right 08/14/11   right , ER/PR +, HER2 -  . port-a-cath insertion     port-a-cath removal 1'14  . PORT-A-CATH REMOVAL  06/22/2012   Procedure: MINOR REMOVAL PORT-A-CATH;  Surgeon: Haywood Lasso, MD;  Location: [place];  Service: General;  Laterality: Left;  . PORTACATH PLACEMENT  09/11/2011   Procedure: INSERTION PORT-A-CATH;  Surgeon: Joyice Faster. Cornett, MD;  [facility];  Service: General;  Laterality: N/A;  Insert of Port  . TUBAL LIGATION      There were no vitals filed for this visit.   Subjective Assessment - 11/25/19 0811    Subjective Pt states R knee is feeling good but L is hurting this morning    Currently in Pain? Yes    Pain Score 7     Pain Location Knee    Pain Orientation Left                             [facility] Adult PT Treatment/Exercise - 11/25/19 0001  Knee/Hip Exercises: Stretches   Active Hamstring Stretch Both;1 rep;30 seconds      Knee/Hip Exercises: Aerobic   Nustep L4 x 7 min      Knee/Hip Exercises: Standing   Heel Raises Both;1 set;15 reps    Heel Raises Limitations 2#    Knee Flexion Both;1 set;10 reps    Knee Flexion Limitations 2#    Hip Abduction Both;1 set;10 reps;Knee straight    Abduction Limitations 2#    Hip Extension Both;1 set;10 reps;Knee straight    Extension Limitations 2#    Other Standing Knee Exercises alt step taps 2" step with SPC    Other Standing Knee Exercises sit to stand with yellow ball OHP 3x5      Knee/Hip Exercises: Seated   Long Arc Quad Both;2 sets;10 reps    Long Arc Quad Weight 2 lbs.    Marching Both;2 sets;10 reps;Strengthening    Marching Weights 2 lbs.    Hamstring Curl Both;2 sets;15 reps    Hamstring Limitations green Tband                      PT Short Term Goals - 11/04/19 0850      PT SHORT TERM GOAL #1   Title Pt will be independent with HEP    Status Achieved             PT Long Term Goals - 11/18/19 6945      PT LONG TERM GOAL #1   Title Pt will demonstrate TUG time <15 seconds with LRAD    Status On-going                 Plan - 11/25/19 0909    Clinical Impression Statement Pt able to tolerate prolonged standing ex's today at counter; cues to minimize compensations with hip ex's. Pt able to perform alt step taps today with SPC; difficulty attaining hip/knee flexion but no LOB noted. Required frequent rest breaks d/t fatigue. Continue to progress.    PT Treatment/Interventions ADLs/Self Care Home Management;Electrical Stimulation;Cryotherapy;Iontophoresis 71m/ml Dexamethasone;Moist Heat;Ultrasound;Neuromuscular re-education;Balance training;Therapeutic exercise;Therapeutic activities;Functional mobility training;Stair training;Gait training;Patient/family education;Manual techniques;Passive range of motion;Taping;Vasopneumatic Device    PT Next Visit Plan LE strengthening/flexibility and endurance ex's, stair progression    Consulted and Agree with Plan of Care Patient           Patient will benefit from skilled therapeutic intervention in order to improve the following deficits and impairments:  Abnormal gait, Decreased range of motion, Difficulty walking, Decreased endurance, Obesity, Decreased activity tolerance, Pain, Decreased balance, Hypomobility, Improper body mechanics, Impaired flexibility, Decreased strength, Increased edema, Postural dysfunction  Visit Diagnosis: Muscle weakness (generalized)  Chronic pain of left knee  Chronic pain of right knee     Problem List Patient Active Problem List   Diagnosis Date Noted  . Chronic atrial fibrillation ([place]   . Osteopenia 02/15/2016  . Obesity 01/19/2014  . Neuromuscular disorder ([place]   . Breast cancer of lower-inner  quadrant of right female breast ([place] 07/24/2011  . Murmur 06/17/2011  . Mixed hyperlipidemia 06/17/2011  . Obstructive sleep apnea 11/16/2008  . Essential hypertension 11/16/2008   AAmador Cunas PT, DPT ADonald ProseSugg 11/25/2019, 9:12 AM  CPlymouthBFontanelleSuite [address] [address]Phone: [phone]  Fax:  [phone] Name: SJENNETTE LEASKMRN: [id number] of Birth: [birth year]

## 2019-11-28 ENCOUNTER — Other Ambulatory Visit: Payer: Self-pay | Admitting: Physician Assistant

## 2019-12-01 ENCOUNTER — Ambulatory Visit: Payer: Medicare Other | Admitting: Interventional Cardiology

## 2019-12-05 ENCOUNTER — Ambulatory Visit: Payer: Medicare Other | Admitting: Physical Therapy

## 2019-12-05 ENCOUNTER — Other Ambulatory Visit: Payer: Self-pay

## 2019-12-05 DIAGNOSIS — M25562 Pain in left knee: Secondary | ICD-10-CM

## 2019-12-05 DIAGNOSIS — G8929 Other chronic pain: Secondary | ICD-10-CM

## 2019-12-05 DIAGNOSIS — M6281 Muscle weakness (generalized): Secondary | ICD-10-CM

## 2019-12-05 NOTE — Therapy (Signed)
[place], [place], [address] Phone: [phone]   Fax:  [phone]  Physical Therapy Treatment  Patient Details  Name: [name] MRN: [id number] Date of Birth: 21-Apr-1951 Referring Provider (PT): Laurine Blazer   Encounter Date: 12/05/2019   PT End of Session - 12/05/19 0847    Visit Number 8    Date for PT Re-Evaluation 12/27/19    PT Start Time 0800    PT Stop Time 0845    PT Time Calculation (min) 45 min           Past Medical History:  Diagnosis Date  . Arthritis   . Asthma   . Blood transfusion    hx of last one in 1987  . Breast cancer [facility]) 2013   Right Breast Cancer  . Cancer of lower-inner quadrant of female breast ([place]) 07/24/2011   right breast cancer /IDC,stage IIB,er/pr=+,her2=Neg  . Glaucoma   . Heart murmur   . History of chemotherapy    taxotere/cytoxan 6 cycles 09/22/11-01/05/12 day 2 neulasta  . Hyperlipemia   . Hypertension   . Lymphedema    right arm wears a sleeve  . MRSA (methicillin resistant staph aureus) culture positive    08/2011  . Neuromuscular disorder ([place])    peripheral neuropathy hands feet  . OSA (obstructive sleep apnea)    CPAP settings- ?   . Persistent atrial fibrillation ([place])   . Pituitary abnormality ([place]) 05-25-13   small pituitary growth-appears stable- Dr. Christella Noa follows    Past Surgical History:  Procedure Laterality Date  . ABDOMINAL HYSTERECTOMY  2007   TAH/BSO  . BREAST SURGERY     mastectomy RIGHT  . CARDIOVERSION N/A 06/02/2017   Procedure: CARDIOVERSION;  Surgeon: Dorothy Spark, MD;  Location: [place];  Service: Cardiovascular;  Laterality: N/A;  . CARPAL TUNNEL RELEASE Right    2012  . CHOLECYSTECTOMY  1987   open - Dr Lindon Romp  . COLONOSCOPY N/A 06/13/2013   Procedure: COLONOSCOPY;  Surgeon: Juanita Craver, MD;  [facility];  Service: Endoscopy;  Laterality: N/A;  . DILATION AND CURETTAGE OF UTERUS       x3  . EYE SURGERY     laser eye surgery for glaucoma  . MASTECTOMY MODIFIED RADICAL Right 08/14/11   right , ER/PR +, HER2 -  . port-a-cath insertion     port-a-cath removal 1'14  . PORT-A-CATH REMOVAL  06/22/2012   Procedure: MINOR REMOVAL PORT-A-CATH;  Surgeon: Haywood Lasso, MD;  Location: [place];  Service: General;  Laterality: Left;  . PORTACATH PLACEMENT  09/11/2011   Procedure: INSERTION PORT-A-CATH;  Surgeon: Joyice Faster. Cornett, MD;  [facility];  Service: General;  Laterality: N/A;  Insert of Port  . TUBAL LIGATION      There were no vitals filed for this visit.   Subjective Assessment - 12/05/19 0812    Subjective Patient states she isnt feeling too bad today.    Currently in Pain? Yes    Pain Score 6     Pain Location Knee    Pain Orientation Left                             [facility] Adult PT Treatment/Exercise - 12/05/19 0001      Knee/Hip Exercises: Stretches   Active Hamstring Stretch Both;1 rep;30 seconds    Gastroc Stretch Both;1 rep;30  seconds      Knee/Hip Exercises: Aerobic   Nustep L5 x 7 min      Knee/Hip Exercises: Standing   Knee Flexion --   red tband   Forward Step Up Both;5 reps;Step Height: 4";2 sets   second set only RLE   Forward Step Up Limitations pain with L step up on L knee      Knee/Hip Exercises: Seated   Hamstring Curl Both;2 sets;15 reps    Hamstring Limitations green Tband     Sit to Sand 2 sets;15 reps;without UE support      Knee/Hip Exercises: Supine   Bridges Strengthening;2 sets;10 reps    Straight Leg Raises Strengthening;2 sets;10 reps;AROM                    PT Short Term Goals - 11/04/19 0850      PT SHORT TERM GOAL #1   Title Pt will be independent with HEP    Status Achieved             PT Long Term Goals - 11/18/19 4628      PT LONG TERM GOAL #1   Title Pt will demonstrate TUG time <15 seconds with LRAD    Status On-going                  Plan - 12/05/19 0848    Clinical Impression Statement Patient tolerated TE well today. She had slight increasing pain with LLE step ups, however, no pain on R. Frequent VC for technique and rest breaks required due to fatigue.    Personal Factors and Comorbidities Age;Comorbidity 3+;Time since onset of injury/illness/exacerbation;Fitness    Comorbidities HTN, asthma, afib    Examination-Activity Limitations Bend;Lift;Stand;Stairs;Squat;Locomotion Level;Transfers    Examination-Participation Restrictions Church;Cleaning;Community Activity;Volunteer    Rehab Potential Good    PT Frequency 2x / week    PT Duration 8 weeks    PT Treatment/Interventions ADLs/Self Care Home Management;Electrical Stimulation;Cryotherapy;Iontophoresis 25m/ml Dexamethasone;Moist Heat;Ultrasound;Neuromuscular re-education;Balance training;Therapeutic exercise;Therapeutic activities;Functional mobility training;Stair training;Gait training;Patient/family education;Manual techniques;Passive range of motion;Taping;Vasopneumatic Device    PT Next Visit Plan LE strengthening & endurance. Address goals    Consulted and Agree with Plan of Care Patient           Patient will benefit from skilled therapeutic intervention in order to improve the following deficits and impairments:  Abnormal gait, Decreased range of motion, Difficulty walking, Decreased endurance, Obesity, Decreased activity tolerance, Pain, Decreased balance, Hypomobility, Improper body mechanics, Impaired flexibility, Decreased strength, Increased edema, Postural dysfunction  Visit Diagnosis: Muscle weakness (generalized)  Chronic pain of left knee  Chronic pain of right knee     Problem List Patient Active Problem List   Diagnosis Date Noted  . Chronic atrial fibrillation ([place]   . Osteopenia 02/15/2016  . Obesity 01/19/2014  . Neuromuscular disorder ([place]   . 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 Murmur  06/17/2011  . Mixed hyperlipidemia 06/17/2011  . Obstructive sleep apnea 11/16/2008  . Essential hypertension 11/16/2008    EYork Cerise SWoodbury Heights7/04/2020, 8:52 AM  [address]oliviaB[address] [address]Phone: [phone]  Fax:  [phone] Name: SELIZETH WEINRICHMRN: [id number] of Birth: 7May 06, 1952

## 2019-12-08 ENCOUNTER — Ambulatory Visit: Payer: Medicare Other | Admitting: Physical Therapy

## 2019-12-12 ENCOUNTER — Ambulatory Visit: Payer: Medicare Other | Admitting: Physical Therapy

## 2019-12-12 ENCOUNTER — Other Ambulatory Visit: Payer: Self-pay

## 2019-12-12 DIAGNOSIS — M25562 Pain in left knee: Secondary | ICD-10-CM

## 2019-12-12 DIAGNOSIS — G8929 Other chronic pain: Secondary | ICD-10-CM

## 2019-12-12 DIAGNOSIS — M25561 Pain in right knee: Secondary | ICD-10-CM

## 2019-12-12 DIAGNOSIS — M6281 Muscle weakness (generalized): Secondary | ICD-10-CM

## 2019-12-12 NOTE — Therapy (Signed)
[place], [place], [address] Phone: [phone]   Fax:  [phone]  Physical Therapy Treatment  Patient Details  Name: [name] MRN: [id number] Date of Birth: 08-Jan-1951 Referring Provider (PT): Laurine Blazer   Encounter Date: 12/12/2019   PT End of Session - 12/12/19 0842    Visit Number 9    Date for PT Re-Evaluation 12/27/19    PT Start Time 0802    PT Stop Time 0842    PT Time Calculation (min) 40 min           Past Medical History:  Diagnosis Date  . Arthritis   . Asthma   . Blood transfusion    hx of last one in 1987  . Breast cancer [facility]) 2013   Right Breast Cancer  . Cancer of lower-inner quadrant of female breast ([place]) 07/24/2011   right breast cancer /IDC,stage IIB,er/pr=+,her2=Neg  . Glaucoma   . Heart murmur   . History of chemotherapy    taxotere/cytoxan 6 cycles 09/22/11-01/05/12 day 2 neulasta  . Hyperlipemia   . Hypertension   . Lymphedema    right arm wears a sleeve  . MRSA (methicillin resistant staph aureus) culture positive    08/2011  . Neuromuscular disorder ([place])    peripheral neuropathy hands feet  . OSA (obstructive sleep apnea)    CPAP settings- ?   . Persistent atrial fibrillation ([place])   . Pituitary abnormality ([place]) 05-25-13   small pituitary growth-appears stable- Dr. Christella Noa follows    Past Surgical History:  Procedure Laterality Date  . ABDOMINAL HYSTERECTOMY  2007   TAH/BSO  . BREAST SURGERY     mastectomy RIGHT  . CARDIOVERSION N/A 06/02/2017   Procedure: CARDIOVERSION;  Surgeon: Dorothy Spark, MD;  Location: [facility];  Service: Cardiovascular;  Laterality: N/A;  . CARPAL TUNNEL RELEASE Right    2012  . CHOLECYSTECTOMY  1987   open - Dr Lindon Romp  . COLONOSCOPY N/A 06/13/2013   Procedure: COLONOSCOPY;  Surgeon: Juanita Craver, MD;  [facility];  Service: Endoscopy;  Laterality: N/A;  . DILATION AND CURETTAGE OF UTERUS       x3  . EYE SURGERY     laser eye surgery for glaucoma  . MASTECTOMY MODIFIED RADICAL Right 08/14/11   right , ER/PR +, HER2 -  . port-a-cath insertion     port-a-cath removal 1'14  . PORT-A-CATH REMOVAL  06/22/2012   Procedure: MINOR REMOVAL PORT-A-CATH;  Surgeon: Haywood Lasso, MD;  Location: [facility];  Service: General;  Laterality: Left;  . PORTACATH PLACEMENT  09/11/2011   Procedure: INSERTION PORT-A-CATH;  Surgeon: Joyice Faster. Cornett, MD;  [facility];  Service: General;  Laterality: N/A;  Insert of Port  . TUBAL LIGATION      There were no vitals filed for this visit.   Subjective Assessment - 12/12/19 0808    Subjective Patient states she is feeling okay today.    Currently in Pain? Yes    Pain Score 5     Pain Location Knee    Pain Orientation Left              [facility] PT Assessment - 12/12/19 0001      Strength   Overall Strength Comments hip  BIL 4-/5 ext, 4+/5 abd  [place] Adult PT Treatment/Exercise - 12/12/19 0001      Knee/Hip Exercises: Aerobic   Nustep L5 x 7 min      Knee/Hip Exercises: Standing   Hip Abduction Both;1 set;Knee straight;15 reps;10 reps    Abduction Limitations 2#    Hip Extension Stengthening;Both;15 reps;2 sets    Extension Limitations 2#      Knee/Hip Exercises: Seated   Long Arc Quad Both;2 sets;15 reps    Long Arc Quad Weight 2 lbs.    Marching Both;2 sets;Strengthening;15 reps    Marching Weights 2 lbs.    Hamstring Curl Both;2 sets;15 reps    Hamstring Limitations green tband eccentric focused    Sit to Sand 2 sets;10 reps;without UE support   second set with yellow weighted ball overhead                   PT Short Term Goals - 11/04/19 0850      PT SHORT TERM GOAL #1   Title Pt will be independent with HEP    Status Achieved             PT Long Term Goals - 12/12/19 7341      PT LONG TERM GOAL #1   Title Pt will demonstrate TUG time <15  seconds with LRAD    Baseline 22 seconds no AD    Status On-going      PT LONG TERM GOAL #2   Baseline 4-/5 ext, 4+/5 abduction    Status Partially Met      PT LONG TERM GOAL #3   Title Pt will demonstrate 10 STS from chair with no UE support    Status Achieved                 Plan - 12/12/19 0842    Clinical Impression Statement Patient tolerated progression of TE well. Less rest breaks required today and patient making strength progress. Slight increase in knee pain after NUSTEP.    Personal Factors and Comorbidities Age;Comorbidity 3+;Time since onset of injury/illness/exacerbation;Fitness    Comorbidities HTN, asthma, afib    Examination-Activity Limitations Bend;Lift;Stand;Stairs;Squat;Locomotion Level;Transfers    Examination-Participation Restrictions Church;Cleaning;Community Activity;Volunteer    Rehab Potential Good    PT Frequency 2x / week    PT Duration 8 weeks    PT Treatment/Interventions ADLs/Self Care Home Management;Electrical Stimulation;Cryotherapy;Iontophoresis 69m/ml Dexamethasone;Moist Heat;Ultrasound;Neuromuscular re-education;Balance training;Therapeutic exercise;Therapeutic activities;Functional mobility training;Stair training;Gait training;Patient/family education;Manual techniques;Passive range of motion;Taping;Vasopneumatic Device    PT Next Visit Plan LE strengthening and endurance.    Consulted and Agree with Plan of Care Patient           Patient will benefit from skilled therapeutic intervention in order to improve the following deficits and impairments:  Abnormal gait, Decreased range of motion, Difficulty walking, Decreased endurance, Obesity, Decreased activity tolerance, Pain, Decreased balance, Hypomobility, Improper body mechanics, Impaired flexibility, Decreased strength, Increased edema, Postural dysfunction  Visit Diagnosis: Muscle weakness (generalized)  Chronic pain of left knee  Chronic pain of right knee     Problem  List Patient Active Problem List   Diagnosis Date Noted  . Chronic atrial fibrillation ([place]   . Osteopenia 02/15/2016  . Obesity 01/19/2014  . Neuromuscular disorder ([place]   . Breast cancer of lower-inner quadrant of right female breast [place] 07/24/2011  . Murmur 06/17/2011  . Mixed hyperlipidemia 06/17/2011  . Obstructive sleep apnea 11/16/2008  . Essential hypertension 11/16/2008    EYork Cerise SLinden7/19/2021, 8:46 AM  [place]  [place] [address] [place], [place], [address] Phone: [phone]   Fax:  [phone]  Name: [name] MRN: [id number] Date of Birth: [birth year]

## 2019-12-14 ENCOUNTER — Ambulatory Visit: Payer: Medicare Other | Admitting: Physical Therapy

## 2019-12-14 ENCOUNTER — Encounter: Payer: Self-pay | Admitting: Physical Therapy

## 2019-12-14 ENCOUNTER — Other Ambulatory Visit: Payer: Self-pay

## 2019-12-14 DIAGNOSIS — G8929 Other chronic pain: Secondary | ICD-10-CM

## 2019-12-14 DIAGNOSIS — M6281 Muscle weakness (generalized): Secondary | ICD-10-CM | POA: Diagnosis not present

## 2019-12-14 NOTE — Therapy (Signed)
[place], [place], [address] Phone: [phone]   Fax:  [phone]  Physical Therapy Treatment Progress Note Reporting Period 10/27/2019 to 12/12/2019  See note below for Objective Data and Assessment of Progress/Goals.      Patient Details  Name: [name] MRN: [id number] Date of Birth: 01-18-1951 Referring Provider (PT): Laurine Blazer   Encounter Date: 12/14/2019   PT End of Session - 12/14/19 0845    Visit Number 10    Date for PT Re-Evaluation 12/27/19    PT Start Time 0803    PT Stop Time 0844    PT Time Calculation (min) 41 min    Activity Tolerance Patient tolerated treatment well    Behavior During Therapy [facility] for tasks assessed/performed           Past Medical History:  Diagnosis Date  . Arthritis   . Asthma   . Blood transfusion    hx of last one in 1987  . Breast cancer [facility]) 2013   Right Breast Cancer  . Cancer of lower-inner quadrant of female breast ([place]) 07/24/2011   right breast cancer /IDC,stage IIB,er/pr=+,her2=Neg  . Glaucoma   . Heart murmur   . History of chemotherapy    taxotere/cytoxan 6 cycles 09/22/11-01/05/12 day 2 neulasta  . Hyperlipemia   . Hypertension   . Lymphedema    right arm wears a sleeve  . MRSA (methicillin resistant staph aureus) culture positive    08/2011  . Neuromuscular disorder ([place])    peripheral neuropathy hands feet  . OSA (obstructive sleep apnea)    CPAP settings- ?   . Persistent atrial fibrillation ([place])   . Pituitary abnormality ([place]) 05-25-13   small pituitary growth-appears stable- Dr. Christella Noa follows    Past Surgical History:  Procedure Laterality Date  . ABDOMINAL HYSTERECTOMY  2007   TAH/BSO  . BREAST SURGERY     mastectomy RIGHT  . CARDIOVERSION N/A 06/02/2017   Procedure: CARDIOVERSION;  Surgeon: Dorothy Spark, MD;  Location: [facility];  Service: Cardiovascular;  Laterality: N/A;  . CARPAL TUNNEL  RELEASE Right    2012  . CHOLECYSTECTOMY  1987   open - Dr Lindon Romp  . COLONOSCOPY N/A 06/13/2013   Procedure: COLONOSCOPY;  Surgeon: Juanita Craver, MD;  [facility];  Service: Endoscopy;  Laterality: N/A;  . DILATION AND CURETTAGE OF UTERUS     x3  . EYE SURGERY     laser eye surgery for glaucoma  . MASTECTOMY MODIFIED RADICAL Right 08/14/11   right , ER/PR +, HER2 -  . port-a-cath insertion     port-a-cath removal 1'14  . PORT-A-CATH REMOVAL  06/22/2012   Procedure: MINOR REMOVAL PORT-A-CATH;  Surgeon: Haywood Lasso, MD;  Location: [facility];  Service: General;  Laterality: Left;  . PORTACATH PLACEMENT  09/11/2011   Procedure: INSERTION PORT-A-CATH;  Surgeon: Joyice Faster. Cornett, MD;  [facility];  Service: General;  Laterality: N/A;  Insert of Port  . TUBAL LIGATION      There were no vitals filed for this visit.   Subjective Assessment - 12/14/19 0808    Subjective Pt reports a little sore; feels like she is making good progress in PT    Currently in Pain? Yes    Pain Score 3     Pain Location Knee    Pain Orientation Left    Pain Descriptors / Indicators Sore  [place] Adult PT Treatment/Exercise - 12/14/19 0001      Knee/Hip Exercises: Aerobic   Nustep L5 x 7 min      Knee/Hip Exercises: Standing   Hip Abduction Both;1 set;10 reps    Abduction Limitations 2    Hip Extension Both;1 set;10 reps    Extension Limitations 2    Lateral Step Up Both;2 sets;5 reps    Lateral Step Up Limitations 5 reps stepping up then 10x with lateral toe taps; difficulty stepping up to R side    Forward Step Up Both;2 sets;5 reps;Hand Hold: 1;Step Height: 4"    Other Standing Knee Exercises sit to stand with yellow ball press 2x10      Knee/Hip Exercises: Seated   Long Arc Quad Both;2 sets;10 reps    Long Arc Quad Weight 2 lbs.    Marching Both;2 sets;10 reps    Marching Weights 2 lbs.                     PT Short Term Goals - 11/04/19 0850      PT SHORT TERM GOAL #1   Title Pt will be independent with HEP    Status Achieved             PT Long Term Goals - 12/14/19 0847      PT LONG TERM GOAL #1   Title Pt will demonstrate TUG time <15 seconds with LRAD    Baseline 22 seconds no AD    Status On-going      PT LONG TERM GOAL #2   Title Pt will demonstrate B hip abd/ext strength 4+/5    Baseline 4-/5 ext, 4+/5 abduction    Status Partially Met      PT LONG TERM GOAL #3   Title Pt will demonstrate 10 STS from chair with no UE support    Status Achieved      PT LONG TERM GOAL #4   Title Pt will ascend/descend 2 stairs with no HR and supervision assist in order to be able to access home safely    Baseline step ups to 4" stair with 1 HR    Status On-going      PT LONG TERM GOAL #5   Title Pt will ambulate >547f over level and unlevel surfaces with no LOB or increase in knee pain    Status On-going                 Plan - 12/14/19 0845    Clinical Impression Statement Pt is progressing towards goals; has met STS goal. Pt is working towards stair progression goal; having a lot of difficulty with steps ups and fatigues quickly with this exercise. Pt requires fewer rest breaks throughout session. Continue to progress.    PT Treatment/Interventions ADLs/Self Care Home Management;Electrical Stimulation;Cryotherapy;Iontophoresis 439mml Dexamethasone;Moist Heat;Ultrasound;Neuromuscular re-education;Balance training;Therapeutic exercise;Therapeutic activities;Functional mobility training;Stair training;Gait training;Patient/family education;Manual techniques;Passive range of motion;Taping;Vasopneumatic Device    PT Next Visit Plan LE strengthening and endurance.    Consulted and Agree with Plan of Care Patient           Patient will benefit from skilled therapeutic intervention in order to improve the following deficits and impairments:  Abnormal gait,  Decreased range of motion, Difficulty walking, Decreased endurance, Obesity, Decreased activity tolerance, Pain, Decreased balance, Hypomobility, Improper body mechanics, Impaired flexibility, Decreased strength, Increased edema, Postural dysfunction  Visit Diagnosis: Chronic pain of left knee  Muscle weakness (generalized)  Chronic pain of right knee  Problem List Patient Active Problem List   Diagnosis Date Noted  . Chronic atrial fibrillation ([place])   . Osteopenia 02/15/2016  . Obesity 01/19/2014  . Neuromuscular disorder ([place])   . Breast cancer of lower-inner quadrant of right female breast ([place]) 07/24/2011  . Murmur 06/17/2011  . Mixed hyperlipidemia 06/17/2011  . Obstructive sleep apnea 11/16/2008  . Essential hypertension 11/16/2008   Amador Cunas, PT, DPT [place]se Mckena Chern 12/14/2019, 8:48 AM  [place] [address] [place], [place], [address] Phone: [phone]   Fax:  [phone]  Name: [name] MRN: [id number] Date of Birth: Dec 18, 1950

## 2019-12-19 ENCOUNTER — Other Ambulatory Visit: Payer: Self-pay

## 2019-12-19 ENCOUNTER — Ambulatory Visit: Payer: Medicare Other | Admitting: Physical Therapy

## 2019-12-19 ENCOUNTER — Encounter: Payer: Self-pay | Admitting: Physical Therapy

## 2019-12-19 DIAGNOSIS — M6281 Muscle weakness (generalized): Secondary | ICD-10-CM | POA: Diagnosis not present

## 2019-12-19 DIAGNOSIS — M25562 Pain in left knee: Secondary | ICD-10-CM

## 2019-12-19 DIAGNOSIS — G8929 Other chronic pain: Secondary | ICD-10-CM

## 2019-12-19 NOTE — Therapy (Signed)
[place], [place], [address] Phone: [phone]   Fax:  [phone]  Physical Therapy Treatment  Patient Details  Name: [name] MRN: [id number] Date of Birth: December 31, 1950 Referring Provider (PT): Laurine Blazer   Encounter Date: 12/19/2019   PT End of Session - 12/19/19 0927    Visit Number 11    Date for PT Re-Evaluation 12/27/19    PT Start Time 0845    PT Stop Time 0927    PT Time Calculation (min) 42 min    Activity Tolerance Patient tolerated treatment well    Behavior During Therapy [facility] for tasks assessed/performed           Past Medical History:  Diagnosis Date  . Arthritis   . Asthma   . Blood transfusion    hx of last one in 1987  . Breast cancer [facility]) 2013   Right Breast Cancer  . Cancer of lower-inner quadrant of female breast ([place]) 07/24/2011   right breast cancer /IDC,stage IIB,er/pr=+,her2=Neg  . Glaucoma   . Heart murmur   . History of chemotherapy    taxotere/cytoxan 6 cycles 09/22/11-01/05/12 day 2 neulasta  . Hyperlipemia   . Hypertension   . Lymphedema    right arm wears a sleeve  . MRSA (methicillin resistant staph aureus) culture positive    08/2011  . Neuromuscular disorder ([place])    peripheral neuropathy hands feet  . OSA (obstructive sleep apnea)    CPAP settings- ?   . Persistent atrial fibrillation ([place])   . Pituitary abnormality ([place]) 05-25-13   small pituitary growth-appears stable- Dr. Christella Noa follows    Past Surgical History:  Procedure Laterality Date  . ABDOMINAL HYSTERECTOMY  2007   TAH/BSO  . BREAST SURGERY     mastectomy RIGHT  . CARDIOVERSION N/A 06/02/2017   Procedure: CARDIOVERSION;  Surgeon: Dorothy Spark, MD;  Location: [facility];  Service: Cardiovascular;  Laterality: N/A;  . CARPAL TUNNEL RELEASE Right    2012  . CHOLECYSTECTOMY  1987   open - Dr Lindon Romp  . COLONOSCOPY N/A 06/13/2013   Procedure: COLONOSCOPY;  Surgeon:  Juanita Craver, MD;  [facility];  Service: Endoscopy;  Laterality: N/A;  . DILATION AND CURETTAGE OF UTERUS     x3  . EYE SURGERY     laser eye surgery for glaucoma  . MASTECTOMY MODIFIED RADICAL Right 08/14/11   right , ER/PR +, HER2 -  . port-a-cath insertion     port-a-cath removal 1'14  . PORT-A-CATH REMOVAL  06/22/2012   Procedure: MINOR REMOVAL PORT-A-CATH;  Surgeon: Haywood Lasso, MD;  Location: [facility];  Service: General;  Laterality: Left;  . PORTACATH PLACEMENT  09/11/2011   Procedure: INSERTION PORT-A-CATH;  Surgeon: Joyice Faster. Cornett, MD;  [facility];  Service: General;  Laterality: N/A;  Insert of Port  . TUBAL LIGATION      There were no vitals filed for this visit.   Subjective Assessment - 12/19/19 0850    Subjective Pt reports R knee is pretty painful this morning    Currently in Pain? Yes    Pain Score 6     Pain Location Knee    Pain Orientation Right                             [facility] Adult PT Treatment/Exercise - 12/19/19 0001  Knee/Hip Exercises: Aerobic   Nustep L5 x 6 min    Other Aerobic UBE 3 min fwd/3 min bkwd      Knee/Hip Exercises: Standing   Hip Abduction Both;1 set;10 reps    Abduction Limitations 2    Hip Extension Both;1 set;10 reps    Extension Limitations 2    Gait Training x400 ft inside/outside over level/unlevel surfaces with SPC    Other Standing Knee Exercises alt step taps 1x10 to 4" step, 2x10 to 6" step with 2# weights (HH assist)    Other Standing Knee Exercises STS 1x10, 1x10 with yellow ball chest press      Knee/Hip Exercises: Seated   Long Arc Quad Both;2 sets;10 reps    Long Arc Quad Weight 2 lbs.    Marching Both;2 sets;10 reps    Marching Weights 2 lbs.                    PT Short Term Goals - 11/04/19 0850      PT SHORT TERM GOAL #1   Title Pt will be independent with HEP    Status Achieved             PT Long Term Goals - 12/14/19 0847       PT LONG TERM GOAL #1   Title Pt will demonstrate TUG time <15 seconds with LRAD    Baseline 22 seconds no AD    Status On-going      PT LONG TERM GOAL #2   Title Pt will demonstrate B hip abd/ext strength 4+/5    Baseline 4-/5 ext, 4+/5 abduction    Status Partially Met      PT LONG TERM GOAL #3   Title Pt will demonstrate 10 STS from chair with no UE support    Status Achieved      PT LONG TERM GOAL #4   Title Pt will ascend/descend 2 stairs with no HR and supervision assist in order to be able to access home safely    Baseline step ups to 4" stair with 1 HR    Status On-going      PT LONG TERM GOAL #5   Title Pt will ambulate >545f over level and unlevel surfaces with no LOB or increase in knee pain    Status On-going                 Plan - 12/19/19 00569   Clinical Impression Statement Pt tolerated progression of TE well; still requires frequent rest breaks with standing ex's. Gait this rx with SPC over level/unlevel surfaces indoor/outdoor with supervision assist. Demos lateral lean B for foot clearance; cues for heel strike and knee flexion.    PT Treatment/Interventions ADLs/Self Care Home Management;Electrical Stimulation;Cryotherapy;Iontophoresis 471mml Dexamethasone;Moist Heat;Ultrasound;Neuromuscular re-education;Balance training;Therapeutic exercise;Therapeutic activities;Functional mobility training;Stair training;Gait training;Patient/family education;Manual techniques;Passive range of motion;Taping;Vasopneumatic Device    PT Next Visit Plan LE strengthening and endurance.    Consulted and Agree with Plan of Care Patient           Patient will benefit from skilled therapeutic intervention in order to improve the following deficits and impairments:  Abnormal gait, Decreased range of motion, Difficulty walking, Decreased endurance, Obesity, Decreased activity tolerance, Pain, Decreased balance, Hypomobility, Improper body mechanics, Impaired flexibility,  Decreased strength, Increased edema, Postural dysfunction  Visit Diagnosis: Chronic pain of left knee  Muscle weakness (generalized)  Chronic pain of right knee     Problem List Patient Active Problem List  Diagnosis Date Noted  . Chronic atrial fibrillation ([place])   . Osteopenia 02/15/2016  . Obesity 01/19/2014  . Neuromuscular disorder ([place])   . Breast cancer of lower-inner quadrant of right female breast ([place]) 07/24/2011  . Murmur 06/17/2011  . Mixed hyperlipidemia 06/17/2011  . Obstructive sleep apnea 11/16/2008  . Essential hypertension 11/16/2008   Amador Cunas, PT, DPT Donald Prose Shakemia [place] 12/19/2019, 9:29 AM  [place] [address] [place], [place], [address] Phone: [phone]   Fax:  [phone]  Name: [name] MRN: [id number] Date of Birth: [birth year]

## 2019-12-29 ENCOUNTER — Ambulatory Visit: Payer: Medicare Other | Attending: Family Medicine | Admitting: Physical Therapy

## 2019-12-29 ENCOUNTER — Encounter: Payer: Self-pay | Admitting: Physical Therapy

## 2019-12-29 ENCOUNTER — Other Ambulatory Visit: Payer: Self-pay

## 2019-12-29 DIAGNOSIS — M25561 Pain in right knee: Secondary | ICD-10-CM | POA: Diagnosis not present

## 2019-12-29 DIAGNOSIS — G8929 Other chronic pain: Secondary | ICD-10-CM | POA: Insufficient documentation

## 2019-12-29 DIAGNOSIS — M25562 Pain in left knee: Secondary | ICD-10-CM | POA: Insufficient documentation

## 2019-12-29 DIAGNOSIS — M6281 Muscle weakness (generalized): Secondary | ICD-10-CM | POA: Insufficient documentation

## 2019-12-29 NOTE — Therapy (Signed)
[place], [place], [address] Phone: [phone]   Fax:  [phone]  Physical Therapy Treatment  Patient Details  Name: [name] MRN: [id number] Date of Birth: 12/21/1950 Referring Provider (PT): Laurine Blazer   Encounter Date: 12/29/2019   PT End of Session - 12/29/19 0926    Visit Number 12    Date for PT Re-Evaluation 12/27/19    PT Start Time 0845    PT Stop Time 0927    PT Time Calculation (min) 42 min    Activity Tolerance Patient tolerated treatment well    Behavior During Therapy [facility] for tasks assessed/performed           Past Medical History:  Diagnosis Date  . Arthritis   . Asthma   . Blood transfusion    hx of last one in 1987  . Breast cancer [facility]) 2013   Right Breast Cancer  . Cancer of lower-inner quadrant of female breast ([place]) 07/24/2011   right breast cancer /IDC,stage IIB,er/pr=+,her2=Neg  . Glaucoma   . Heart murmur   . History of chemotherapy    taxotere/cytoxan 6 cycles 09/22/11-01/05/12 day 2 neulasta  . Hyperlipemia   . Hypertension   . Lymphedema    right arm wears a sleeve  . MRSA (methicillin resistant staph aureus) culture positive    08/2011  . Neuromuscular disorder ([place])    peripheral neuropathy hands feet  . OSA (obstructive sleep apnea)    CPAP settings- ?   . Persistent atrial fibrillation ([place])   . Pituitary abnormality ([place]) 05-25-13   small pituitary growth-appears stable- Dr. Christella Noa follows    Past Surgical History:  Procedure Laterality Date  . ABDOMINAL HYSTERECTOMY  2007   TAH/BSO  . BREAST SURGERY     mastectomy RIGHT  . CARDIOVERSION N/A 06/02/2017   Procedure: CARDIOVERSION;  Surgeon: Dorothy Spark, MD;  Location: [place];  Service: Cardiovascular;  Laterality: N/A;  . CARPAL TUNNEL RELEASE Right    2012  . CHOLECYSTECTOMY  1987   open - Dr Lindon Romp  . COLONOSCOPY N/A 06/13/2013   Procedure: COLONOSCOPY;  Surgeon:  Juanita Craver, MD;  [facility];  Service: Endoscopy;  Laterality: N/A;  . DILATION AND CURETTAGE OF UTERUS     x3  . EYE SURGERY     laser eye surgery for glaucoma  . MASTECTOMY MODIFIED RADICAL Right 08/14/11   right , ER/PR +, HER2 -  . port-a-cath insertion     port-a-cath removal 1'14  . PORT-A-CATH REMOVAL  06/22/2012   Procedure: MINOR REMOVAL PORT-A-CATH;  Surgeon: Haywood Lasso, MD;  Location: [place];  Service: General;  Laterality: Left;  . PORTACATH PLACEMENT  09/11/2011   Procedure: INSERTION PORT-A-CATH;  Surgeon: Joyice Faster. Cornett, MD;  [facility];  Service: General;  Laterality: N/A;  Insert of Port  . TUBAL LIGATION      There were no vitals filed for this visit.   Subjective Assessment - 12/29/19 0853    Subjective Pretty good, L knee is bothering her a little bit    Currently in Pain? Yes    Pain Score 5     Pain Location Knee    Pain Orientation Left                             [facility] Adult PT Treatment/Exercise - 12/29/19 0001  Knee/Hip Exercises: Aerobic   Nustep L5 x 5 min    Other Aerobic UBE 2 min fwd/2 min bkwd      Knee/Hip Exercises: Standing   Gait Training x400 ft inside/outside over level/unlevel surfaces with SPC    Other Standing Knee Exercises Alt 6 in steps with SPC x 10 each       Knee/Hip Exercises: Seated   Long Arc Quad Both;2 sets;10 reps    Long Arc Quad Weight 3 lbs.    Hamstring Curl Both;2 sets;15 reps    Hamstring Limitations green tband eccentric focused    Sit to Sand 2 sets;10 reps;without UE support                    PT Short Term Goals - 11/04/19 0850      PT SHORT TERM GOAL #1   Title Pt will be independent with HEP    Status Achieved             PT Long Term Goals - 12/29/19 0930      PT LONG TERM GOAL #1   Title Pt will demonstrate TUG time <15 seconds with LRAD    Status On-going      PT LONG TERM GOAL #2   Title Pt will demonstrate B  hip abd/ext strength 4+/5    Status Partially Met      PT LONG TERM GOAL #3   Title Pt will demonstrate 10 STS from chair with no UE support    Status Achieved      PT LONG TERM GOAL #4   Title Pt will ascend/descend 2 stairs with no HR and supervision assist in order to be able to access home safely    Status On-going      PT LONG TERM GOAL #5   Title Pt will ambulate >597f over level and unlevel surfaces with no LOB or increase in knee pain   Fatigue may be limiting factor   Status On-going                 Plan - 12/29/19 0927    Clinical Impression Statement Pt very fatigue today with activity. She reports her knee pain bounces around from R to L. Cues needed to complete LE exercises with full ROM. Decrease R hip and knee flexion with ambulation. One seated rest break needed during gait trial today    Personal Factors and Comorbidities Age;Comorbidity 3+;Time since onset of injury/illness/exacerbation;Fitness    Comorbidities HTN, asthma, afib    Examination-Activity Limitations Bend;Lift;Stand;Stairs;Squat;Locomotion Level;Transfers    Examination-Participation Restrictions Church;Cleaning;Community Activity;Volunteer    Stability/Clinical Decision Making Stable/Uncomplicated    Rehab Potential Good    PT Frequency 2x / week    PT Duration 8 weeks    PT Treatment/Interventions ADLs/Self Care Home Management;Electrical Stimulation;Cryotherapy;Iontophoresis 473mml Dexamethasone;Moist Heat;Ultrasound;Neuromuscular re-education;Balance training;Therapeutic exercise;Therapeutic activities;Functional mobility training;Stair training;Gait training;Patient/family education;Manual techniques;Passive range of motion;Taping;Vasopneumatic Device    PT Next Visit Plan LE strengthening and endurance.           Patient will benefit from skilled therapeutic intervention in order to improve the following deficits and impairments:  Abnormal gait, Decreased range of motion, Difficulty  walking, Decreased endurance, Obesity, Decreased activity tolerance, Pain, Decreased balance, Hypomobility, Improper body mechanics, Impaired flexibility, Decreased strength, Increased edema, Postural dysfunction  Visit Diagnosis: Chronic pain of right knee  Muscle weakness (generalized)  Chronic pain of left knee     Problem List Patient Active Problem List   Diagnosis  Date Noted  . Chronic atrial fibrillation ([place])   . Osteopenia 02/15/2016  . Obesity 01/19/2014  . Neuromuscular disorder ([place])   . Breast cancer of lower-inner quadrant of right female breast ([place]) 07/24/2011  . Murmur 06/17/2011  . Mixed hyperlipidemia 06/17/2011  . Obstructive sleep apnea 11/16/2008  . Essential hypertension 11/16/2008   Amador Cunas, PT, DPT Scot Jun, PTA 12/29/2019, 9:33 AM  [place], [place], [address] Phone: [phone]   Fax:  [phone]  Name: KINBERLY PERRIS MRN: [id number] Date of Birth: Nov 16, 1950

## 2019-12-30 ENCOUNTER — Encounter: Payer: Self-pay | Admitting: Physical Therapy

## 2019-12-30 ENCOUNTER — Ambulatory Visit: Payer: Medicare Other | Admitting: Physical Therapy

## 2019-12-30 DIAGNOSIS — M6281 Muscle weakness (generalized): Secondary | ICD-10-CM

## 2019-12-30 DIAGNOSIS — G8929 Other chronic pain: Secondary | ICD-10-CM

## 2019-12-30 DIAGNOSIS — M25561 Pain in right knee: Secondary | ICD-10-CM | POA: Diagnosis not present

## 2019-12-30 NOTE — Therapy (Signed)
[place], [place], [address] Phone: [phone]   Fax:  [phone]  Physical Therapy Treatment  Patient Details  Name: [name] MRN: [id number] Date of Birth: 27-Apr-1951 Referring Provider (PT): Laurine Blazer   Encounter Date: 12/30/2019   PT End of Session - 12/30/19 0916    Visit Number 13    Date for PT Re-Evaluation 01/29/20    PT Start Time 0806    PT Stop Time 0845    PT Time Calculation (min) 39 min    Activity Tolerance Patient tolerated treatment well    Behavior During Therapy [facility] for tasks assessed/performed           Past Medical History:  Diagnosis Date  . Arthritis   . Asthma   . Blood transfusion    hx of last one in 1987  . Breast cancer [facility]) 2013   Right Breast Cancer  . Cancer of lower-inner quadrant of female breast ([place]) 07/24/2011   right breast cancer /IDC,stage IIB,er/pr=+,her2=Neg  . Glaucoma   . Heart murmur   . History of chemotherapy    taxotere/cytoxan 6 cycles 09/22/11-01/05/12 day 2 neulasta  . Hyperlipemia   . Hypertension   . Lymphedema    right arm wears a sleeve  . MRSA (methicillin resistant staph aureus) culture positive    08/2011  . Neuromuscular disorder ([place])    peripheral neuropathy hands feet  . OSA (obstructive sleep apnea)    CPAP settings- ?   . Persistent atrial fibrillation ([place])   . Pituitary abnormality ([place]) 05-25-13   small pituitary growth-appears stable- Dr. Christella Noa follows    Past Surgical History:  Procedure Laterality Date  . ABDOMINAL HYSTERECTOMY  2007   TAH/BSO  . BREAST SURGERY     mastectomy RIGHT  . CARDIOVERSION N/A 06/02/2017   Procedure: CARDIOVERSION;  Surgeon: Dorothy Spark, MD;  Location: [place];  Service: Cardiovascular;  Laterality: N/A;  . CARPAL TUNNEL RELEASE Right    2012  . CHOLECYSTECTOMY  1987   open - Dr Lindon Romp  . COLONOSCOPY N/A 06/13/2013   Procedure: COLONOSCOPY;  Surgeon:  Juanita Craver, MD;  [facility];  Service: Endoscopy;  Laterality: N/A;  . DILATION AND CURETTAGE OF UTERUS     x3  . EYE SURGERY     laser eye surgery for glaucoma  . MASTECTOMY MODIFIED RADICAL Right 08/14/11   right , ER/PR +, HER2 -  . port-a-cath insertion     port-a-cath removal 1'14  . PORT-A-CATH REMOVAL  06/22/2012   Procedure: MINOR REMOVAL PORT-A-CATH;  Surgeon: Haywood Lasso, MD;  Location: [place];  Service: General;  Laterality: Left;  . PORTACATH PLACEMENT  09/11/2011   Procedure: INSERTION PORT-A-CATH;  Surgeon: Joyice Faster. Cornett, MD;  [facility];  Service: General;  Laterality: N/A;  Insert of Port  . TUBAL LIGATION      There were no vitals filed for this visit.   Subjective Assessment - 12/30/19 0810    Subjective Pt reports R knee feeling good, L knee in pain.    Currently in Pain? Yes    Pain Score 7     Pain Location Knee    Pain Orientation Left                             [facility] Adult PT Treatment/Exercise - 12/30/19 0001  Ambulation/Gait   Ambulation/Gait Yes    Ambulation/Gait Assistance 6: Modified independent (Device/Increase time)    Ambulation Distance (Feet) 800 Feet    Assistive device Straight cane    Gait Pattern Step-to pattern;Decreased step length - right;Decreased step length - left;Decreased hip/knee flexion - right;Decreased hip/knee flexion - left;Decreased dorsiflexion - right;Decreased dorsiflexion - left;Shuffle;Lateral trunk lean to right;Lateral trunk lean to left;Wide base of support    Ambulation Surface Level;Outdoor    Gait Comments wide BOS; up/down 2 curbs with supervision assist      Knee/Hip Exercises: Aerobic   Nustep L5 x 8 min    Other Aerobic UBE 2 min fwd/2 min bkwd      Knee/Hip Exercises: Seated   Long Arc Quad Both;2 sets;10 reps    Marching Both;2 sets;10 reps    Sit to [employer] 2 sets;10 reps;without UE support                    PT Short Term  Goals - 11/04/19 0850      PT SHORT TERM GOAL #1   Title Pt will be independent with HEP    Status Achieved             PT Long Term Goals - 12/29/19 0930      PT LONG TERM GOAL #1   Title Pt will demonstrate TUG time <15 seconds with LRAD    Status On-going      PT LONG TERM GOAL #2   Title Pt will demonstrate B hip abd/ext strength 4+/5    Status Partially Met      PT LONG TERM GOAL #3   Title Pt will demonstrate 10 STS from chair with no UE support    Status Achieved      PT LONG TERM GOAL #4   Title Pt will ascend/descend 2 stairs with no HR and supervision assist in order to be able to access home safely    Status On-going      PT LONG TERM GOAL #5   Title Pt will ambulate >524f over level and unlevel surfaces with no LOB or increase in knee pain   Fatigue may be limiting factor   Status On-going                 Plan - 12/30/19 0917    Clinical Impression Statement Pt fatigue/endurance along with LE functional weakness is primary limiting factor. Focused on endurance and functional activity today with gait indoor/outdoors over level/unlevel surfaces and up/down curbs. Pt turns laterally to descend curbs and struggles with eccentric control of LE.    PT Treatment/Interventions ADLs/Self Care Home Management;Electrical Stimulation;Cryotherapy;Iontophoresis 466mml Dexamethasone;Moist Heat;Ultrasound;Neuromuscular re-education;Balance training;Therapeutic exercise;Therapeutic activities;Functional mobility training;Stair training;Gait training;Patient/family education;Manual techniques;Passive range of motion;Taping;Vasopneumatic Device    PT Next Visit Plan LE strengthening and endurance.    Consulted and Agree with Plan of Care Patient           Patient will benefit from skilled therapeutic intervention in order to improve the following deficits and impairments:  Abnormal gait, Decreased range of motion, Difficulty walking, Decreased endurance, Obesity,  Decreased activity tolerance, Pain, Decreased balance, Hypomobility, Improper body mechanics, Impaired flexibility, Decreased strength, Increased edema, Postural dysfunction  Visit Diagnosis: Chronic pain of right knee  Muscle weakness (generalized)  Chronic pain of left knee     Problem List Patient Active Problem List   Diagnosis Date Noted  . Chronic atrial fibrillation ([place]  . Osteopenia 02/15/2016  . Obesity  01/19/2014  . Neuromuscular disorder ([place])   . Breast cancer of lower-inner quadrant of right female breast ([place]) 07/24/2011  . Murmur 06/17/2011  . Mixed hyperlipidemia 06/17/2011  . Obstructive sleep apnea 11/16/2008  . Essential hypertension 11/16/2008   Amador Cunas, PT, DPT Donald Prose Dayron Odland 12/30/2019, 9:42 AM  [place] [address] [place], [place], [address] Phone: [phone]   Fax:  [phone]  Name: [name] MRN: [id number] Date of Birth: 06-Dec-1950

## 2020-01-09 ENCOUNTER — Ambulatory Visit: Payer: Medicare Other | Admitting: Physical Therapy

## 2020-01-10 NOTE — Progress Notes (Signed)
Cardiology Office Note   Date:  01/12/2020   ID:  [name], DOB Apr 03, 1951, MRN [id number]  PCP:  Stephens Shire, MD    No chief complaint on file.  AFib  Wt Readings from Last 3 Encounters:  01/12/20 294 lb (133.4 kg)  03/28/19 297 lb 1.6 oz (134.8 kg)  01/07/19 289 lb (131.1 kg)       History of Present Illness: [name] is a 69 y.o. female    Who was seen in the AFib clinic.  Per Roderic Palau, she has "a h/o of breast CA s/p R mastectomy not currently undergoing chemo or radiation, HLD, HTN, and a small benign pituitary mass as of 2018.  In December 2018, she had some dizziness while eating dinner.  Heart rhythm was irregular at the time and she was diagnosed with atrial fibrillation.  She was started on low-dose beta-blocker and Xarelto at the time.  She had a successful cardioversion in January 2019.  She was in sinus rhythm 11 days after the cardioversion at her follow-up appointment.  I then saw her in April 2019 for her to establish with general cardiology.  At that time, she was back in atrial fibrillation.  We increased her Toprol-XL dose to 50 mg daily.  She continues with Xarelto.   She was also followed in the atrial fibrillation clinic.  In May 2020, she was seen in the office and at that time, was asymptomatic while on Toprol-XL 75 mg twice daily.  Since the last visit, she gained weight with less exercise due to COVID.  SHe stopped going to the gym.  Denies : Chest pain. Dizziness. Leg edema. Nitroglycerin use. Orthopnea. Palpitations. Paroxysmal nocturnal dyspnea. Shortness of breath. Syncope.   She does have to walk up stairs and has no CP or SHOB.  Limited more by her knee.   Stress test many years ago per her report was normal.     Past Medical History:  Diagnosis Date  . Arthritis   . Asthma   . Blood transfusion    hx of last one in 1987  . Breast cancer [facility]) 2013   Right Breast Cancer  . Cancer of lower-inner quadrant of  female breast ([place]) 07/24/2011   right breast cancer /IDC,stage IIB,er/pr=+,her2=Neg  . Glaucoma   . Heart murmur   . History of chemotherapy    taxotere/cytoxan 6 cycles 09/22/11-01/05/12 day 2 neulasta  . Hyperlipemia   . Hypertension   . Lymphedema    right arm wears a sleeve  . MRSA (methicillin resistant staph aureus) culture positive    08/2011  . Neuromuscular disorder ([place])    peripheral neuropathy hands feet  . OSA (obstructive sleep apnea)    CPAP settings- ?   . Persistent atrial fibrillation ([place])   . Pituitary abnormality ([place]) 05-25-13   small pituitary growth-appears stable- Dr. Christella Noa follows    Past Surgical History:  Procedure Laterality Date  . ABDOMINAL HYSTERECTOMY  2007   TAH/BSO  . BREAST SURGERY     mastectomy RIGHT  . CARDIOVERSION N/A 06/02/2017   Procedure: CARDIOVERSION;  Surgeon: Dorothy Spark, MD;  Location: [place];  Service: Cardiovascular;  Laterality: N/A;  . CARPAL TUNNEL RELEASE Right    2012  . CHOLECYSTECTOMY  1987   open - Dr Lindon Romp  . COLONOSCOPY N/A 06/13/2013   Procedure: COLONOSCOPY;  Surgeon: Juanita Craver, MD;  [facility];  Service: Endoscopy;  Laterality: N/A;  . DILATION AND  CURETTAGE OF UTERUS     x3  . EYE SURGERY     laser eye surgery for glaucoma  . MASTECTOMY MODIFIED RADICAL Right 08/14/11   right , ER/PR +, HER2 -  . port-a-cath insertion     port-a-cath removal 1'14  . PORT-A-CATH REMOVAL  06/22/2012   Procedure: MINOR REMOVAL PORT-A-CATH;  Surgeon: Haywood Lasso, MD;  Location: [facility];  Service: General;  Laterality: Left;  . PORTACATH PLACEMENT  09/11/2011   Procedure: INSERTION PORT-A-CATH;  Surgeon: Joyice Faster. Cornett, MD;  [facility];  Service: General;  Laterality: N/A;  Insert of Port  . TUBAL LIGATION       Current Outpatient Medications  Medication Sig Dispense Refill  . acetaminophen (TYLENOL) 325 MG tablet Take 325-650 mg by mouth every 6 (six) hours as needed  (for pain.).    Marland Kitchen albuterol (PROAIR HFA) 108 (90 Base) MCG/ACT inhaler Inhale 2 puffs into the lungs every 6 (six) hours as needed for wheezing or shortness of breath. For shortness of breath    . amLODipine (NORVASC) 2.5 MG tablet Take 1 tablet (2.5 mg total) by mouth daily. Please make overdue appt with Dr. Irish Lack before anymore refills. 1st attempt 30 tablet 0  . anastrozole (ARIMIDEX) 1 MG tablet Take 1 tablet (1 mg total) by mouth daily. 90 tablet 3  . cabergoline (DOSTINEX) 0.5 MG tablet Take 0.5 mg by mouth 2 (two) times a week. Tuesday and Thursday    . Cholecalciferol (VITAMIN D3) 1000 UNITS CAPS Take 1,000 Units by mouth every other day.     . furosemide (LASIX) 20 MG tablet TAKE 1 TABLET DAILY AS NEEDED FOR FLUID 90 tablet 3  . GLUCOSAMINE HCL PO Take 1 tablet by mouth daily.    . hydroxypropyl methylcellulose / hypromellose (ISOPTO TEARS / GONIOVISC) 2.5 % ophthalmic solution Place 1 drop into both eyes 3 (three) times daily as needed for dry eyes.    Marland Kitchen losartan-hydrochlorothiazide (HYZAAR) 100-25 MG tablet Take 1 tablet by mouth daily. 90 tablet 3  . metoprolol succinate (TOPROL-XL) 50 MG 24 hr tablet TAKE ONE AND ONE-HALF TABLETS TWICE A DAY 270 tablet 0  . Multiple Vitamin (MULITIVITAMIN WITH MINERALS) TABS Take 1 tablet by mouth daily with breakfast.     . potassium chloride SA (KLOR-CON) 20 MEQ tablet Take 2 tablets by mouth daily. Take when taking lasix(Furosemide). Please make overdue appt with Dr. Irish Lack before anymore refills. 1st attempt 60 tablet 0  . SIMBRINZA 1-0.2 % SUSP Place 1 drop into both eyes 3 (three) times daily.     . simvastatin (ZOCOR) 20 MG tablet TAKE 1 TABLET AT BEDTIME 90 tablet 3  . TRAVATAN Z 0.004 % SOLN ophthalmic solution Place 1 drop into both eyes every evening.     . vitamin B-12 (CYANOCOBALAMIN) 500 MCG tablet Take 500 mcg by mouth daily.    Alveda Reasons 20 MG TABS tablet TAKE 1 TABLET DAILY WITH SUPPER 90 tablet 1   No current  facility-administered medications for this visit.   Facility-Administered Medications Ordered in Other Visits  Medication Dose Route Frequency Provider Last Rate Last Admin  . lidocaine (cardiac) 100 mg/105m (XYLOCAINE) 20 MG/ML injection 2%    Anesthesia Intra-op JMontel Clock CRNA   60 mg at 06/02/17 1343    Allergies:   Trandolapril-verapamil hcl er and Trandolapril-verapamil hcl er    Social History:  The patient  reports that she has never smoked. She has never used smokeless  tobacco. She reports that she does not drink alcohol and does not use drugs.   Family History:  The patient's family history includes Arthritis in her mother; Cancer (age of onset: 109) in her father.    ROS:  Please see the history of present illness.   Otherwise, review of systems are positive for weight gain.   All other systems are reviewed and negative.    PHYSICAL EXAM: VS:  BP 118/80   Pulse 79   Ht 5' 2"  (1.575 m)   Wt 294 lb (133.4 kg)   SpO2 97%   BMI 53.77 kg/m  , BMI Body mass index is 53.77 kg/m. GEN: Well nourished, well developed, in no acute distress  HEENT: normal  Neck: no JVD, carotid bruits, or masses Cardiac: irregularly irregular; no murmurs, rubs, or gallops,no edema  Respiratory:  clear to auscultation bilaterally, normal work of breathing GI: soft, nontender, nondistended, + BS MS: no deformity or atrophy  Skin: warm and dry, no rash Neuro:  Strength and sensation are intact Psych: euthymic mood, full affect   EKG:   The ekg ordered today demonstrates AFib, rate controlled   Recent Labs: 03/28/2019: ALT 11; BUN 21; Creatinine, Ser 1.21; Hemoglobin 12.3; Platelets 162; Potassium 3.7; Sodium 142   Lipid Panel    Component Value Date/Time   CHOL 182 10/15/2018 1047   TRIG 66 10/15/2018 1047   HDL 64 10/15/2018 1047   CHOLHDL 2.8 10/15/2018 1047   LDLCALC 105 (H) 10/15/2018 1047     Other studies Reviewed: Additional studies/ records that were reviewed today  with results demonstrating: Labs from 2/21 reviewed; LDL 79 at [place]:  1. Persistent atrial fibrillation: Xarelto for stroke prevention.Rate controlled.  Also follows with AFib clinic.   2. Hypertension: The current medical regimen is effective;  continue present plan and medications. 3. OSA: On CPAP.  TOlerating this well. 4. Obesity: She had lost weight in early 2020.  Gained some back.  Walking limited by left knee.  5. Anticoagulated: Watch for bleeding problems.  No bleeding problems. Hbg normal in 2/21.  Repeat blood tests coming up with PMD.  6. Preoperative eval:  No anginal sx with any activity.  I think she would be ok for TKR if this is the plan.    Current medicines are reviewed at length with the patient today.  The patient concerns regarding her medicines were addressed.  The following changes have been made:  No change  Labs/ tests ordered today include:  No orders of the defined types were placed in this encounter.   Recommend 150 minutes/week of aerobic exercise Low fat, low carb, high fiber diet recommended  Disposition:   FU in 1 year   Signed, Larae Grooms, MD  01/12/2020 8:44 AM    [facility] [address], [place], [place]  [address] Phone: [phone]; Fax: [phone]

## 2020-01-11 ENCOUNTER — Ambulatory Visit: Payer: Medicare Other | Admitting: Physical Therapy

## 2020-01-11 ENCOUNTER — Other Ambulatory Visit: Payer: Self-pay

## 2020-01-11 ENCOUNTER — Encounter: Payer: Self-pay | Admitting: Physical Therapy

## 2020-01-11 DIAGNOSIS — M25561 Pain in right knee: Secondary | ICD-10-CM

## 2020-01-11 DIAGNOSIS — G8929 Other chronic pain: Secondary | ICD-10-CM

## 2020-01-11 DIAGNOSIS — M6281 Muscle weakness (generalized): Secondary | ICD-10-CM

## 2020-01-11 NOTE — Therapy (Signed)
[place], [place], [address] Phone: [phone]   Fax:  [phone]  Physical Therapy Treatment  Patient Details  Name: [name] MRN: [id number] Date of Birth: 05-08-1951 Referring Provider (PT): Laurine Blazer   Encounter Date: 01/11/2020   PT End of Session - 01/11/20 0903    Visit Number 14    Date for PT Re-Evaluation 01/29/20    PT Start Time 0815    PT Stop Time 0858    PT Time Calculation (min) 43 min    Activity Tolerance Patient limited by pain    Behavior During Therapy [facility] for tasks assessed/performed           Past Medical History:  Diagnosis Date  . Arthritis   . Asthma   . Blood transfusion    hx of last one in 1987  . Breast cancer [facility]) 2013   Right Breast Cancer  . Cancer of lower-inner quadrant of female breast ([place]) 07/24/2011   right breast cancer /IDC,stage IIB,er/pr=+,her2=Neg  . Glaucoma   . Heart murmur   . History of chemotherapy    taxotere/cytoxan 6 cycles 09/22/11-01/05/12 day 2 neulasta  . Hyperlipemia   . Hypertension   . Lymphedema    right arm wears a sleeve  . MRSA (methicillin resistant staph aureus) culture positive    08/2011  . Neuromuscular disorder ([place])    peripheral neuropathy hands feet  . OSA (obstructive sleep apnea)    CPAP settings- ?   . Persistent atrial fibrillation ([place])   . Pituitary abnormality ([place]) 05-25-13   small pituitary growth-appears stable- Dr. Christella Noa follows    Past Surgical History:  Procedure Laterality Date  . ABDOMINAL HYSTERECTOMY  2007   TAH/BSO  . BREAST SURGERY     mastectomy RIGHT  . CARDIOVERSION N/A 06/02/2017   Procedure: CARDIOVERSION;  Surgeon: Dorothy Spark, MD;  Location: [place];  Service: Cardiovascular;  Laterality: N/A;  . CARPAL TUNNEL RELEASE Right    2012  . CHOLECYSTECTOMY  1987   open - Dr Lindon Romp  . COLONOSCOPY N/A 06/13/2013   Procedure: COLONOSCOPY;  Surgeon: Juanita Craver, MD;  [facility];  Service: Endoscopy;  Laterality: N/A;  . DILATION AND CURETTAGE OF UTERUS     x3  . EYE SURGERY     laser eye surgery for glaucoma  . MASTECTOMY MODIFIED RADICAL Right 08/14/11   right , ER/PR +, HER2 -  . port-a-cath insertion     port-a-cath removal 1'14  . PORT-A-CATH REMOVAL  06/22/2012   Procedure: MINOR REMOVAL PORT-A-CATH;  Surgeon: Haywood Lasso, MD;  Location: [place];  Service: General;  Laterality: Left;  . PORTACATH PLACEMENT  09/11/2011   Procedure: INSERTION PORT-A-CATH;  Surgeon: Joyice Faster. Cornett, MD;  [facility];  Service: General;  Laterality: N/A;  Insert of Port  . TUBAL LIGATION      There were no vitals filed for this visit.   Subjective Assessment - 01/11/20 0838    Subjective Pt requested WC to come to the clinic this morning d/t bad muscle cramp on outside of L leg along ITB when getting out of the car.    Currently in Pain? Yes    Pain Score 6     Pain Location Knee    Multiple Pain Sites Yes    Pain Score 8    Pain Location Leg    Pain Orientation Left;Lateral  Pain Descriptors / Indicators Cramping    Pain Type Acute pain    Pain Onset Today                             [facility] Adult PT Treatment/Exercise - 01/11/20 0001      Ambulation/Gait   Gait Comments gait x 50 ft with SPC; antalgic      Knee/Hip Exercises: Aerobic   Nustep L3 x 6 min      Modalities   Modalities Moist Heat      Moist Heat Therapy   Number Minutes Moist Heat 7 Minutes    Moist Heat Location Hip   L ITB to relieve spasm     Manual Therapy   Manual Therapy Soft tissue mobilization    Soft tissue mobilization STM to L ITB to relieve muscle cramping/spasm                    PT Short Term Goals - 11/04/19 0850      PT SHORT TERM GOAL #1   Title Pt will be independent with HEP    Status Achieved             PT Long Term Goals - 12/29/19 0930      PT LONG TERM GOAL #1     Title Pt will demonstrate TUG time <15 seconds with LRAD    Status On-going      PT LONG TERM GOAL #2   Title Pt will demonstrate B hip abd/ext strength 4+/5    Status Partially Met      PT LONG TERM GOAL #3   Title Pt will demonstrate 10 STS from chair with no UE support    Status Achieved      PT LONG TERM GOAL #4   Title Pt will ascend/descend 2 stairs with no HR and supervision assist in order to be able to access home safely    Status On-going      PT LONG TERM GOAL #5   Title Pt will ambulate >526f over level and unlevel surfaces with no LOB or increase in knee pain   Fatigue may be limiting factor   Status On-going                 Plan - 01/11/20 0903    Clinical Impression Statement [facility] had a severe cramp in L lateral leg/ITB when getting out of the car. Manual therapy and STM for most of rx to L ITB and moist heat to calm spasm. Pt able to tolerate 6 min on nustep and limited ambulation this rx. Educated on self massage with tennis ball, staying hydrated, and use of heat to relieve spasm with pt verbalized understanding/agreement.    PT Treatment/Interventions ADLs/Self Care Home Management;Electrical Stimulation;Cryotherapy;Iontophoresis 450mml Dexamethasone;Moist Heat;Ultrasound;Neuromuscular re-education;Balance training;Therapeutic exercise;Therapeutic activities;Functional mobility training;Stair training;Gait training;Patient/family education;Manual techniques;Passive range of motion;Taping;Vasopneumatic Device    PT Next Visit Plan LE strengthening and endurance.    Consulted and Agree with Plan of Care Patient           Patient will benefit from skilled therapeutic intervention in order to improve the following deficits and impairments:  Abnormal gait, Decreased range of motion, Difficulty walking, Decreased endurance, Obesity, Decreased activity tolerance, Pain, Decreased balance, Hypomobility, Improper body  mechanics, Impaired flexibility, Decreased strength, Increased edema, Postural dysfunction  Visit Diagnosis: Chronic pain of right knee  Muscle  weakness (generalized)  Chronic pain of left knee     Problem List Patient Active Problem List   Diagnosis Date Noted  . Chronic atrial fibrillation ([place])   . Osteopenia 02/15/2016  . Obesity 01/19/2014  . Neuromuscular disorder ([place])   . Breast cancer of lower-inner quadrant of right female breast ([place]) 07/24/2011  . Murmur 06/17/2011  . Mixed hyperlipidemia 06/17/2011  . Obstructive sleep apnea 11/16/2008  . Essential hypertension 11/16/2008   Amador Cunas, PT, DPT Donald Prose Anabella Capshaw 01/11/2020, 9:06 AM  [place] [address] [place], [place], [address] Phone: [phone]   Fax:  [phone]  Name: [name] MRN: [id number] Date of Birth: 04/21/1951

## 2020-01-12 ENCOUNTER — Ambulatory Visit (INDEPENDENT_AMBULATORY_CARE_PROVIDER_SITE_OTHER): Payer: Medicare Other | Admitting: Interventional Cardiology

## 2020-01-12 ENCOUNTER — Encounter: Payer: Self-pay | Admitting: Interventional Cardiology

## 2020-01-12 VITALS — BP 118/80 | HR 79 | Ht 62.0 in | Wt 294.0 lb

## 2020-01-12 DIAGNOSIS — G4733 Obstructive sleep apnea (adult) (pediatric): Secondary | ICD-10-CM | POA: Diagnosis not present

## 2020-01-12 DIAGNOSIS — I10 Essential (primary) hypertension: Secondary | ICD-10-CM | POA: Diagnosis not present

## 2020-01-12 DIAGNOSIS — I482 Chronic atrial fibrillation, unspecified: Secondary | ICD-10-CM

## 2020-01-12 DIAGNOSIS — Z7901 Long term (current) use of anticoagulants: Secondary | ICD-10-CM

## 2020-01-12 MED ORDER — METOPROLOL SUCCINATE ER 50 MG PO TB24: ORAL_TABLET | ORAL | 3 refills | Status: DC

## 2020-01-12 MED ORDER — RIVAROXABAN 20 MG PO TABS: ORAL_TABLET | ORAL | 1 refills | Status: DC

## 2020-01-12 NOTE — Patient Instructions (Signed)

## 2020-01-17 ENCOUNTER — Ambulatory Visit: Payer: Medicare Other | Admitting: Physical Therapy

## 2020-01-18 ENCOUNTER — Encounter: Payer: Self-pay | Admitting: Physical Therapy

## 2020-01-18 ENCOUNTER — Other Ambulatory Visit: Payer: Self-pay

## 2020-01-18 ENCOUNTER — Ambulatory Visit: Payer: Medicare Other | Admitting: Physical Therapy

## 2020-01-18 DIAGNOSIS — G8929 Other chronic pain: Secondary | ICD-10-CM

## 2020-01-18 DIAGNOSIS — M6281 Muscle weakness (generalized): Secondary | ICD-10-CM

## 2020-01-18 DIAGNOSIS — M25562 Pain in left knee: Secondary | ICD-10-CM

## 2020-01-18 DIAGNOSIS — M25561 Pain in right knee: Secondary | ICD-10-CM | POA: Diagnosis not present

## 2020-01-18 NOTE — Therapy (Signed)
[place], [place], [address] Phone: [phone]   Fax:  [phone]  Physical Therapy Treatment  Patient Details  Name: [name] MRN: [id number] Date of Birth: 10-13-1950 Referring Provider (PT): Laurine Blazer   Encounter Date: 01/18/2020   PT End of Session - 01/18/20 1600    Visit Number 15    Date for PT Re-Evaluation 01/29/20    PT Start Time 8756    PT Stop Time 1600    PT Time Calculation (min) 45 min    Activity Tolerance Patient limited by pain;Patient tolerated treatment well    Behavior During Therapy [facility] for tasks assessed/performed           Past Medical History:  Diagnosis Date  . Arthritis   . Asthma   . Blood transfusion    hx of last one in 1987  . Breast cancer [facility]) 2013   Right Breast Cancer  . Cancer of lower-inner quadrant of female breast ([place]) 07/24/2011   right breast cancer /IDC,stage IIB,er/pr=+,her2=Neg  . Glaucoma   . Heart murmur   . History of chemotherapy    taxotere/cytoxan 6 cycles 09/22/11-01/05/12 day 2 neulasta  . Hyperlipemia   . Hypertension   . Lymphedema    right arm wears a sleeve  . MRSA (methicillin resistant staph aureus) culture positive    08/2011  . Neuromuscular disorder ([place])    peripheral neuropathy hands feet  . OSA (obstructive sleep apnea)    CPAP settings- ?   . Persistent atrial fibrillation ([place])   . Pituitary abnormality ([place]) 05-25-13   small pituitary growth-appears stable- Dr. Christella Noa follows    Past Surgical History:  Procedure Laterality Date  . ABDOMINAL HYSTERECTOMY  2007   TAH/BSO  . BREAST SURGERY     mastectomy RIGHT  . CARDIOVERSION N/A 06/02/2017   Procedure: CARDIOVERSION;  Surgeon: Dorothy Spark, MD;  Location: [place];  Service: Cardiovascular;  Laterality: N/A;  . CARPAL TUNNEL RELEASE Right    2012  . CHOLECYSTECTOMY  1987   open - Dr Lindon Romp  . COLONOSCOPY N/A 06/13/2013   Procedure:  COLONOSCOPY;  Surgeon: Juanita Craver, MD;  [facility];  Service: Endoscopy;  Laterality: N/A;  . DILATION AND CURETTAGE OF UTERUS     x3  . EYE SURGERY     laser eye surgery for glaucoma  . MASTECTOMY MODIFIED RADICAL Right 08/14/11   right , ER/PR +, HER2 -  . port-a-cath insertion     port-a-cath removal 1'14  . PORT-A-CATH REMOVAL  06/22/2012   Procedure: MINOR REMOVAL PORT-A-CATH;  Surgeon: Haywood Lasso, MD;  Location: [place];  Service: General;  Laterality: Left;  . PORTACATH PLACEMENT  09/11/2011   Procedure: INSERTION PORT-A-CATH;  Surgeon: Joyice Faster. Cornett, MD;  [facility];  Service: General;  Laterality: N/A;  Insert of Port  . TUBAL LIGATION      There were no vitals filed for this visit.   Subjective Assessment - 01/18/20 1520    Subjective "I am good but my breathing is off"    Currently in Pain? Yes    Pain Score 6     Pain Location Knee    Pain Orientation Left                             [facility] Adult PT Treatment/Exercise - 01/18/20 0001  Ambulation/Gait   Ambulation/Gait Yes    Ambulation/Gait Assistance 6: Modified independent (Device/Increase time)    Ambulation Distance (Feet) 180 Feet    Assistive device Straight cane    Gait Pattern Step-to pattern;Decreased step length - right;Decreased step length - left;Decreased hip/knee flexion - right;Decreased hip/knee flexion - left;Decreased dorsiflexion - right;Decreased dorsiflexion - left;Shuffle;Lateral trunk lean to right;Lateral trunk lean to left;Wide base of support    Ambulation Surface Level;Unlevel;Indoor;Outdoor    Gait Comments wide BOS      Knee/Hip Exercises: Aerobic   Nustep L3 x 6 min    Other Aerobic UBE 2 min fwd/2 min bkwd      Knee/Hip Exercises: Seated   Long Arc Quad Both;2 sets;10 reps    Long Arc Quad Weight 3 lbs.    Marching Both;2 sets;10 reps    Marching Weights 3 lbs.    Hamstring Curl Both;2 sets;15 reps    Hamstring  Limitations green tband eccentric focused    Sit to Sand 2 sets;10 reps;without UE support                    PT Short Term Goals - 11/04/19 0850      PT SHORT TERM GOAL #1   Title Pt will be independent with HEP    Status Achieved             PT Long Term Goals - 12/29/19 0930      PT LONG TERM GOAL #1   Title Pt will demonstrate TUG time <15 seconds with LRAD    Status On-going      PT LONG TERM GOAL #2   Title Pt will demonstrate B hip abd/ext strength 4+/5    Status Partially Met      PT LONG TERM GOAL #3   Title Pt will demonstrate 10 STS from chair with no UE support    Status Achieved      PT LONG TERM GOAL #4   Title Pt will ascend/descend 2 stairs with no HR and supervision assist in order to be able to access home safely    Status On-going      PT LONG TERM GOAL #5   Title Pt will ambulate >527f over level and unlevel surfaces with no LOB or increase in knee pain   Fatigue may be limiting factor   Status On-going                 Plan - 01/18/20 1601    Clinical Impression Statement Pt elected for a mote active treatment plan. She is limited by bilateral knee pain L >R. Reports some relief after seated LE exercises. Some momentum needed with sit to stands. One seated rest needed at the end of the hall.    Personal Factors and Comorbidities Age;Comorbidity 3+;Time since onset of injury/illness/exacerbation;Fitness    Comorbidities HTN, asthma, afib    Examination-Activity Limitations Bend;Lift;Stand;Stairs;Squat;Locomotion Level;Transfers    Examination-Participation Restrictions Church;Cleaning;Community Activity;Volunteer    Stability/Clinical Decision Making Stable/Uncomplicated    Rehab Potential Good    PT Frequency 2x / week    PT Duration 8 weeks    PT Treatment/Interventions ADLs/Self Care Home Management;Electrical Stimulation;Cryotherapy;Iontophoresis 474mml Dexamethasone;Moist Heat;Ultrasound;Neuromuscular re-education;Balance  training;Therapeutic exercise;Therapeutic activities;Functional mobility training;Stair training;Gait training;Patient/family education;Manual techniques;Passive range of motion;Taping;Vasopneumatic Device    PT Next Visit Plan LE strengthening and endurance.           Patient will benefit from skilled therapeutic intervention in order to improve the following deficits  and impairments:  Abnormal gait, Decreased range of motion, Difficulty walking, Decreased endurance, Obesity, Decreased activity tolerance, Pain, Decreased balance, Hypomobility, Improper body mechanics, Impaired flexibility, Decreased strength, Increased edema, Postural dysfunction  Visit Diagnosis: Chronic pain of left knee  Muscle weakness (generalized)  Chronic pain of right knee     Problem List Patient Active Problem List   Diagnosis Date Noted  . Chronic atrial fibrillation ([place])   . Osteopenia 02/15/2016  . Obesity 01/19/2014  . Neuromuscular disorder ([place])   . Breast cancer of lower-inner quadrant of right female breast ([place]) 07/24/2011  . Murmur 06/17/2011  . Mixed hyperlipidemia 06/17/2011  . Obstructive sleep apnea 11/16/2008  . Essential hypertension 11/16/2008    Scot Jun, PTA 01/18/2020, 4:06 PM  [place], [place], [address] Phone: [phone]   Fax:  [phone]  Name: [name] MRN: [id number] Date of Birth: 1950-07-05

## 2020-01-19 ENCOUNTER — Ambulatory Visit: Payer: Medicare Other | Admitting: Physical Therapy

## 2020-01-19 ENCOUNTER — Encounter: Payer: Self-pay | Admitting: Physical Therapy

## 2020-01-19 DIAGNOSIS — G8929 Other chronic pain: Secondary | ICD-10-CM

## 2020-01-19 DIAGNOSIS — M25561 Pain in right knee: Secondary | ICD-10-CM | POA: Diagnosis not present

## 2020-01-19 DIAGNOSIS — M6281 Muscle weakness (generalized): Secondary | ICD-10-CM

## 2020-01-19 DIAGNOSIS — M25562 Pain in left knee: Secondary | ICD-10-CM

## 2020-01-19 NOTE — Therapy (Signed)
[place], [place], [address] Phone: [phone]   Fax:  [phone]  Physical Therapy Treatment  Patient Details  Name: [name] MRN: [id number] Date of Birth: June 14, 1950 Referring Provider (PT): Laurine Blazer   Encounter Date: 01/19/2020   PT End of Session - 01/19/20 0924    Visit Number 16    Date for PT Re-Evaluation 01/29/20    PT Start Time 0845    PT Stop Time 0928    PT Time Calculation (min) 43 min    Activity Tolerance Patient tolerated treatment well;Patient limited by fatigue    Behavior During Therapy [facility] for tasks assessed/performed           Past Medical History:  Diagnosis Date  . Arthritis   . Asthma   . Blood transfusion    hx of last one in 1987  . Breast cancer [facility]) 2013   Right Breast Cancer  . Cancer of lower-inner quadrant of female breast ([place]) 07/24/2011   right breast cancer /IDC,stage IIB,er/pr=+,her2=Neg  . Glaucoma   . Heart murmur   . History of chemotherapy    taxotere/cytoxan 6 cycles 09/22/11-01/05/12 day 2 neulasta  . Hyperlipemia   . Hypertension   . Lymphedema    right arm wears a sleeve  . MRSA (methicillin resistant staph aureus) culture positive    08/2011  . Neuromuscular disorder ([place])    peripheral neuropathy hands feet  . OSA (obstructive sleep apnea)    CPAP settings- ?   . Persistent atrial fibrillation ([place])   . Pituitary abnormality ([place]) 05-25-13   small pituitary growth-appears stable- Dr. Christella Noa follows    Past Surgical History:  Procedure Laterality Date  . ABDOMINAL HYSTERECTOMY  2007   TAH/BSO  . BREAST SURGERY     mastectomy RIGHT  . CARDIOVERSION N/A 06/02/2017   Procedure: CARDIOVERSION;  Surgeon: Dorothy Spark, MD;  Location: [place];  Service: Cardiovascular;  Laterality: N/A;  . CARPAL TUNNEL RELEASE Right    2012  . CHOLECYSTECTOMY  1987   open - Dr Lindon Romp  . COLONOSCOPY N/A 06/13/2013    Procedure: COLONOSCOPY;  Surgeon: Juanita Craver, MD;  [facility];  Service: Endoscopy;  Laterality: N/A;  . DILATION AND CURETTAGE OF UTERUS     x3  . EYE SURGERY     laser eye surgery for glaucoma  . MASTECTOMY MODIFIED RADICAL Right 08/14/11   right , ER/PR +, HER2 -  . port-a-cath insertion     port-a-cath removal 1'14  . PORT-A-CATH REMOVAL  06/22/2012   Procedure: MINOR REMOVAL PORT-A-CATH;  Surgeon: Haywood Lasso, MD;  Location: [place];  Service: General;  Laterality: Left;  . PORTACATH PLACEMENT  09/11/2011   Procedure: INSERTION PORT-A-CATH;  Surgeon: Joyice Faster. Cornett, MD;  [facility];  Service: General;  Laterality: N/A;  Insert of Port  . TUBAL LIGATION      There were no vitals filed for this visit.   Subjective Assessment - 01/19/20 0852    Subjective "Still got that's breathing problem today, but good"    Currently in Pain? Yes    Pain Score 6     Pain Location Knee    Pain Orientation Left                             [facility] Adult PT Treatment/Exercise - 01/19/20 0001  High Level Balance   High Level Balance Activities Side stepping;Backward walking      Knee/Hip Exercises: Aerobic   Nustep L3 x 6 min    Other Aerobic UBE 2 min fwd/2 min bkwd      Knee/Hip Exercises: Seated   Long Arc Quad Both;2 sets;15 reps;Strengthening    Long Arc Quad Weight 2 lbs.    Other Seated Knee/Hip Exercises Seated rows green 2x10    Marching Both;2 sets;10 reps    Marching Weights 2 lbs.    Sit to Sand 2 sets;10 reps;without UE support                    PT Short Term Goals - 11/04/19 0850      PT SHORT TERM GOAL #1   Title Pt will be independent with HEP    Status Achieved             PT Long Term Goals - 12/29/19 0930      PT LONG TERM GOAL #1   Title Pt will demonstrate TUG time <15 seconds with LRAD    Status On-going      PT LONG TERM GOAL #2   Title Pt will demonstrate B hip abd/ext  strength 4+/5    Status Partially Met      PT LONG TERM GOAL #3   Title Pt will demonstrate 10 STS from chair with no UE support    Status Achieved      PT LONG TERM GOAL #4   Title Pt will ascend/descend 2 stairs with no HR and supervision assist in order to be able to access home safely    Status On-going      PT LONG TERM GOAL #5   Title Pt will ambulate >587f over level and unlevel surfaces with no LOB or increase in knee pain   Fatigue may be limiting factor   Status On-going                 Plan - 01/19/20 0925    Clinical Impression Statement Increase fatigue noted throughout requiring frequent rest. Cues or full ROM with seated LAQ. SBA needed for all balance interventions. Postural cues needed with seated rows. Amb with wide base of support and decrease R hip and knee flex.    Personal Factors and Comorbidities Age;Comorbidity 3+;Time since onset of injury/illness/exacerbation;Fitness    Comorbidities HTN, asthma, afib    Examination-Activity Limitations Bend;Lift;Stand;Stairs;Squat;Locomotion Level;Transfers    Examination-Participation Restrictions Church;Cleaning;Community Activity;Volunteer    Stability/Clinical Decision Making Stable/Uncomplicated    Rehab Potential Good    PT Frequency 2x / week    PT Duration 8 weeks    PT Treatment/Interventions ADLs/Self Care Home Management;Electrical Stimulation;Cryotherapy;Iontophoresis 444mml Dexamethasone;Moist Heat;Ultrasound;Neuromuscular re-education;Balance training;Therapeutic exercise;Therapeutic activities;Functional mobility training;Stair training;Gait training;Patient/family education;Manual techniques;Passive range of motion;Taping;Vasopneumatic Device    PT Next Visit Plan LE strengthening and endurance.           Patient will benefit from skilled therapeutic intervention in order to improve the following deficits and impairments:  Abnormal gait, Decreased range of motion, Difficulty walking, Decreased  endurance, Obesity, Decreased activity tolerance, Pain, Decreased balance, Hypomobility, Improper body mechanics, Impaired flexibility, Decreased strength, Increased edema, Postural dysfunction  Visit Diagnosis: Muscle weakness (generalized)  Chronic pain of right knee  Chronic pain of left knee     Problem List Patient Active Problem List   Diagnosis Date Noted  . Chronic atrial fibrillation ([place]  . Osteopenia 02/15/2016  . Obesity  01/19/2014  . Neuromuscular disorder ([place])   . Breast cancer of lower-inner quadrant of right female breast ([place]) 07/24/2011  . Murmur 06/17/2011  . Mixed hyperlipidemia 06/17/2011  . Obstructive sleep apnea 11/16/2008  . Essential hypertension 11/16/2008    Scot Jun, PTA 01/19/2020, 9:27 AM  [place], [place], [address] Phone: [phone]   Fax:  [phone]  Name: [name] MRN: [id number] Date of Birth: 06/19/1950

## 2020-01-24 ENCOUNTER — Ambulatory Visit: Payer: Medicare Other | Admitting: Physical Therapy

## 2020-01-26 ENCOUNTER — Other Ambulatory Visit: Payer: Self-pay

## 2020-01-26 ENCOUNTER — Encounter: Payer: Self-pay | Admitting: Physical Therapy

## 2020-01-26 ENCOUNTER — Ambulatory Visit: Payer: Medicare Other | Attending: Family Medicine | Admitting: Physical Therapy

## 2020-01-26 DIAGNOSIS — M25561 Pain in right knee: Secondary | ICD-10-CM | POA: Insufficient documentation

## 2020-01-26 DIAGNOSIS — G8929 Other chronic pain: Secondary | ICD-10-CM | POA: Insufficient documentation

## 2020-01-26 DIAGNOSIS — M6281 Muscle weakness (generalized): Secondary | ICD-10-CM | POA: Insufficient documentation

## 2020-01-26 DIAGNOSIS — M25562 Pain in left knee: Secondary | ICD-10-CM | POA: Insufficient documentation

## 2020-01-26 NOTE — Therapy (Signed)
[place] [address] [place], [place], [address] Phone: [phone]   Fax:  [phone]  Physical Therapy Treatment  Patient Details  Name: [name] MRN: [id number] Date of Birth: 02/22/1951 Referring Provider (PT): Laurine Blazer   Encounter Date: 01/26/2020   PT End of Session - 01/26/20 0856    Visit Number 17    Date for PT Re-Evaluation 02/25/20    PT Start Time 0805    PT Stop Time 0845    PT Time Calculation (min) 40 min    Activity Tolerance Patient tolerated treatment well;Patient limited by fatigue    Behavior During Therapy [facility] for tasks assessed/performed           Past Medical History:  Diagnosis Date  . Arthritis   . Asthma   . Blood transfusion    hx of last one in 1987  . Breast cancer [facility]) 2013   Right Breast Cancer  . Cancer of lower-inner quadrant of female breast ([place]) 07/24/2011   right breast cancer /IDC,stage IIB,er/pr=+,her2=Neg  . Glaucoma   . Heart murmur   . History of chemotherapy    taxotere/cytoxan 6 cycles 09/22/11-01/05/12 day 2 neulasta  . Hyperlipemia   . Hypertension   . Lymphedema    right arm wears a sleeve  . MRSA (methicillin resistant staph aureus) culture positive    08/2011  . Neuromuscular disorder ([place])    peripheral neuropathy hands feet  . OSA (obstructive sleep apnea)    CPAP settings- ?   . Persistent atrial fibrillation ([place])   . Pituitary abnormality ([place]) 05-25-13   small pituitary growth-appears stable- Dr. Christella Noa follows    Past Surgical History:  Procedure Laterality Date  . ABDOMINAL HYSTERECTOMY  2007   TAH/BSO  . BREAST SURGERY     mastectomy RIGHT  . CARDIOVERSION N/A 06/02/2017   Procedure: CARDIOVERSION;  Surgeon: Dorothy Spark, MD;  Location: [place];  Service: Cardiovascular;  Laterality: N/A;  . CARPAL TUNNEL RELEASE Right    2012  . CHOLECYSTECTOMY  1987   open - Dr Lindon Romp  . COLONOSCOPY N/A 06/13/2013   Procedure:  COLONOSCOPY;  Surgeon: Juanita Craver, MD;  [facility];  Service: Endoscopy;  Laterality: N/A;  . DILATION AND CURETTAGE OF UTERUS     x3  . EYE SURGERY     laser eye surgery for glaucoma  . MASTECTOMY MODIFIED RADICAL Right 08/14/11   right , ER/PR +, HER2 -  . port-a-cath insertion     port-a-cath removal 1'14  . PORT-A-CATH REMOVAL  06/22/2012   Procedure: MINOR REMOVAL PORT-A-CATH;  Surgeon: Haywood Lasso, MD;  Location: [place];  Service: General;  Laterality: Left;  . PORTACATH PLACEMENT  09/11/2011   Procedure: INSERTION PORT-A-CATH;  Surgeon: Joyice Faster. Cornett, MD;  [facility];  Service: General;  Laterality: N/A;  Insert of Port  . TUBAL LIGATION      There were no vitals filed for this visit.   Subjective Assessment - 01/26/20 0809    Subjective Pt reports feeling good overall    Currently in Pain? Yes    Pain Score 3     Pain Location Knee    Pain Orientation Left              [facility] PT Assessment - 01/26/20 0001      Timed Up and Go Test   Normal TUG (seconds) 20    TUG Comments  20 sec w/ no AD, 23 sec with SPC                         [facility] Adult PT Treatment/Exercise - 01/26/20 0001      Ambulation/Gait   Stairs Yes    Stairs Assistance 5: Supervision    Stairs Assistance Details (indicate cue type and reason) cues for foot clearance    Stair Management Technique One rail Left;Step to pattern   turned slightly to side   Number of Stairs 4    Height of Stairs 4      Knee/Hip Exercises: Aerobic   Nustep L5 x 8 min      Knee/Hip Exercises: Standing   Heel Raises Both;1 set;10 reps    Heel Raises Limitations 2#    Hip Abduction Both;1 set;10 reps    Abduction Limitations 2#    Hip Extension Both;1 set;10 reps    Extension Limitations 2#    Forward Step Up Both;2 sets;5 reps;Hand Hold: 2;Step Height: 4"      Knee/Hip Exercises: Seated   Long Arc Quad Both;2 sets;15 reps;Strengthening    Long Arc Quad  Weight 2 lbs.    Marching Both;2 sets;10 reps    Marching Weights 2 lbs.    Hamstring Curl Both;2 sets;10 reps    Hamstring Limitations green TB                    PT Short Term Goals - 11/04/19 0850      PT SHORT TERM GOAL #1   Title Pt will be independent with HEP    Status Achieved             PT Long Term Goals - 01/26/20 0817      PT LONG TERM GOAL #1   Title Pt will demonstrate TUG time <15 seconds with LRAD    Baseline 20 sec no AD    Status Partially Met      PT LONG TERM GOAL #2   Title Pt will demonstrate B hip abd/ext strength 4+/5    Status Partially Met      PT LONG TERM GOAL #3   Title Pt will demonstrate 10 STS from chair with no UE support    Status Achieved      PT LONG TERM GOAL #4   Title Pt will ascend/descend 2 stairs with no HR and supervision assist in order to be able to access home safely    Baseline 2 stairs with HR; difficulty with foot clearance coming down    Status Partially Met      PT LONG TERM GOAL #5   Title Pt will ambulate >578ft over level and unlevel surfaces with no LOB or increase in knee pain    Status On-going                 Plan - 01/26/20 0856    Clinical Impression Statement Discussed continuance of PT with patient; pt feels PT is really helping and would like to continue for a few weeks. Stair training today on 4" stairs to improve pt safety when accessing home. Pt demos difficulty with B foot clearance and eccentric control on descent with excessive leaning on UEs; work on this in future rx. Plan to continue PT for ~2 weeks and then d/c with updated HEP.    PT Treatment/Interventions ADLs/Self Care Home Management;Electrical Stimulation;Cryotherapy;Iontophoresis 4mg /ml Dexamethasone;Moist Heat;Ultrasound;Neuromuscular re-education;Balance training;Therapeutic exercise;Therapeutic activities;Functional mobility training;Stair training;Gait  training;Patient/family education;Manual techniques;Passive range of  motion;Taping;Vasopneumatic Device    PT Next Visit Plan LE strengthening and endurance; stair training; functional strengthening    Consulted and Agree with Plan of Care Patient           Patient will benefit from skilled therapeutic intervention in order to improve the following deficits and impairments:  Abnormal gait, Decreased range of motion, Difficulty walking, Decreased endurance, Obesity, Decreased activity tolerance, Pain, Decreased balance, Hypomobility, Improper body mechanics, Impaired flexibility, Decreased strength, Increased edema, Postural dysfunction  Visit Diagnosis: Muscle weakness (generalized)  Chronic pain of right knee  Chronic pain of left knee     Problem List Patient Active Problem List   Diagnosis Date Noted  . Chronic atrial fibrillation ([place])   . Osteopenia 02/15/2016  . Obesity 01/19/2014  . Neuromuscular disorder ([place])   . Breast cancer of lower-inner quadrant of right female breast ([place]) 07/24/2011  . Murmur 06/17/2011  . Mixed hyperlipidemia 06/17/2011  . Obstructive sleep apnea 11/16/2008  . Essential hypertension 11/16/2008   Amador Cunas, PT, DPT Donald Prose Brayson Livesey 01/26/2020, 8:59 AM  [address] [address] [place] [place], [place], [address] Phone: [phone]   Fax:  [phone]  Name: [name] MRN: [id number] Date of Birth: Apr 07, 1951

## 2020-01-31 ENCOUNTER — Ambulatory Visit: Payer: Medicare Other | Admitting: Physical Therapy

## 2020-02-06 ENCOUNTER — Encounter: Payer: Self-pay | Admitting: Physical Therapy

## 2020-02-06 ENCOUNTER — Other Ambulatory Visit: Payer: Self-pay

## 2020-02-06 ENCOUNTER — Ambulatory Visit: Payer: Medicare Other | Admitting: Physical Therapy

## 2020-02-06 DIAGNOSIS — M6281 Muscle weakness (generalized): Secondary | ICD-10-CM | POA: Diagnosis not present

## 2020-02-06 DIAGNOSIS — G8929 Other chronic pain: Secondary | ICD-10-CM

## 2020-02-06 DIAGNOSIS — M25562 Pain in left knee: Secondary | ICD-10-CM

## 2020-02-06 DIAGNOSIS — M25561 Pain in right knee: Secondary | ICD-10-CM

## 2020-02-06 NOTE — Therapy (Signed)
[place], [place], [address] Phone: [phone]   Fax:  [phone]  Physical Therapy Treatment  Patient Details  Name: [name] MRN: [id number] Date of Birth: 12-11-1950 Referring Provider (PT): Laurine Blazer   Encounter Date: 02/06/2020   PT End of Session - 02/06/20 0846    Visit Number 18    Date for PT Re-Evaluation 02/25/20    PT Start Time 0811    PT Stop Time 0845    PT Time Calculation (min) 34 min    Activity Tolerance Patient tolerated treatment well;Patient limited by fatigue    Behavior During Therapy [facility] for tasks assessed/performed           Past Medical History:  Diagnosis Date  . Arthritis   . Asthma   . Blood transfusion    hx of last one in 1987  . Breast cancer [facility]) 2013   Right Breast Cancer  . Cancer of lower-inner quadrant of female breast ([place]) 07/24/2011   right breast cancer /IDC,stage IIB,er/pr=+,her2=Neg  . Glaucoma   . Heart murmur   . History of chemotherapy    taxotere/cytoxan 6 cycles 09/22/11-01/05/12 day 2 neulasta  . Hyperlipemia   . Hypertension   . Lymphedema    right arm wears a sleeve  . MRSA (methicillin resistant staph aureus) culture positive    08/2011  . Neuromuscular disorder ([place])    peripheral neuropathy hands feet  . OSA (obstructive sleep apnea)    CPAP settings- ?   . Persistent atrial fibrillation ([place])   . Pituitary abnormality ([place]) 05-25-13   small pituitary growth-appears stable- Dr. Christella Noa follows    Past Surgical History:  Procedure Laterality Date  . ABDOMINAL HYSTERECTOMY  2007   TAH/BSO  . BREAST SURGERY     mastectomy RIGHT  . CARDIOVERSION N/A 06/02/2017   Procedure: CARDIOVERSION;  Surgeon: Dorothy Spark, MD;  Location: [facility];  Service: Cardiovascular;  Laterality: N/A;  . CARPAL TUNNEL RELEASE Right    2012  . CHOLECYSTECTOMY  1987   open - Dr Lindon Romp  . COLONOSCOPY N/A 06/13/2013    Procedure: COLONOSCOPY;  Surgeon: Juanita Craver, MD;  [facility];  Service: Endoscopy;  Laterality: N/A;  . DILATION AND CURETTAGE OF UTERUS     x3  . EYE SURGERY     laser eye surgery for glaucoma  . MASTECTOMY MODIFIED RADICAL Right 08/14/11   right , ER/PR +, HER2 -  . port-a-cath insertion     port-a-cath removal 1'14  . PORT-A-CATH REMOVAL  06/22/2012   Procedure: MINOR REMOVAL PORT-A-CATH;  Surgeon: Haywood Lasso, MD;  Location: [place];  Service: General;  Laterality: Left;  . PORTACATH PLACEMENT  09/11/2011   Procedure: INSERTION PORT-A-CATH;  Surgeon: Joyice Faster. Cornett, MD;  [facility];  Service: General;  Laterality: N/A;  Insert of Port  . TUBAL LIGATION      There were no vitals filed for this visit.   Subjective Assessment - 02/06/20 0811    Subjective Pt reports she is feeling pretty good. States that R big toe is still sore from where she stubbed it when she was walking a couple weeks ago.    Currently in Pain? Yes    Pain Score 5     Pain Location Knee    Pain Orientation Left              [facility] PT Assessment -  02/06/20 0001      Palpation   Palpation comment R great toe very tender to palpation, mild swelling noted, pt unable to tolerate AROM of great toe this rx                         [facility] Adult PT Treatment/Exercise - 02/06/20 0001      Knee/Hip Exercises: Aerobic   Nustep L5 x 7 min    Other Aerobic UBE 2 min fwd/2 min bkwd      Knee/Hip Exercises: Seated   Long Arc Quad Both;2 sets;15 reps;Strengthening    Long Arc Quad Weight 2 lbs.    Ball Squeeze resisted isometric hip add x10 w/ 3 sec hold    Other Seated Knee/Hip Exercises Seated rows and extensions green 2x10    Marching Both;2 sets;10 reps    Marching Weights 2 lbs.    Hamstring Curl Both;2 sets;10 reps    Hamstring Limitations green TB                    PT Short Term Goals - 11/04/19 0850      PT SHORT TERM GOAL #1    Title Pt will be independent with HEP    Status Achieved             PT Long Term Goals - 01/26/20 0817      PT LONG TERM GOAL #1   Title Pt will demonstrate TUG time <15 seconds with LRAD    Baseline 20 sec no AD    Status Partially Met      PT LONG TERM GOAL #2   Title Pt will demonstrate B hip abd/ext strength 4+/5    Status Partially Met      PT LONG TERM GOAL #3   Title Pt will demonstrate 10 STS from chair with no UE support    Status Achieved      PT LONG TERM GOAL #4   Title Pt will ascend/descend 2 stairs with no HR and supervision assist in order to be able to access home safely    Baseline 2 stairs with HR; difficulty with foot clearance coming down    Status Partially Met      PT LONG TERM GOAL #5   Title Pt will ambulate >527f over level and unlevel surfaces with no LOB or increase in knee pain    Status On-going                 Plan - 02/06/20 0849    Clinical Impression Statement Pt arrived ~10 minutes late this rx. Pt injured great toe a couple weeks ago; R great toe is very tender to palpation, mild swelling, and pt unable to tolerate AROM. Advised pt to message/call MD about toe with VU. Performed ex's in seated today d/t reports of increased throbbing/pain in toe with standing/ambulation. Pt required cues for seated rows/extensions to avoid excessive shoulder elevation.    PT Treatment/Interventions ADLs/Self Care Home Management;Electrical Stimulation;Cryotherapy;Iontophoresis 453mml Dexamethasone;Moist Heat;Ultrasound;Neuromuscular re-education;Balance training;Therapeutic exercise;Therapeutic activities;Functional mobility training;Stair training;Gait training;Patient/family education;Manual techniques;Passive range of motion;Taping;Vasopneumatic Device    PT Next Visit Plan LE strengthening and endurance; stair training; functional strengthening    Consulted and Agree with Plan of Care Patient           Patient will benefit from skilled  therapeutic intervention in order to improve the following deficits and impairments:  Abnormal gait, Decreased range of motion, Difficulty walking,  Decreased endurance, Obesity, Decreased activity tolerance, Pain, Decreased balance, Hypomobility, Improper body mechanics, Impaired flexibility, Decreased strength, Increased edema, Postural dysfunction  Visit Diagnosis: Muscle weakness (generalized)  Chronic pain of right knee  Chronic pain of left knee     Problem List Patient Active Problem List   Diagnosis Date Noted  . Chronic atrial fibrillation ([place])   . Osteopenia 02/15/2016  . Obesity 01/19/2014  . Neuromuscular disorder ([place])   . Breast cancer of lower-inner quadrant of right female breast ([place]) 07/24/2011  . Murmur 06/17/2011  . Mixed hyperlipidemia 06/17/2011  . Obstructive sleep apnea 11/16/2008  . Essential hypertension 11/16/2008   Amador Cunas, PT, DPT Donald Prose [place] 02/06/2020, 9:02 AM  [address] [place] [place], [place], [address] Phone: [phone]   Fax:  [phone]  Name: LEO WEYANDT MRN: [id number] Date of Birth: 09-11-1950

## 2020-02-08 ENCOUNTER — Other Ambulatory Visit: Payer: Self-pay

## 2020-02-08 ENCOUNTER — Ambulatory Visit: Payer: Medicare Other | Admitting: Physical Therapy

## 2020-02-08 ENCOUNTER — Encounter: Payer: Self-pay | Admitting: Physical Therapy

## 2020-02-08 DIAGNOSIS — M25562 Pain in left knee: Secondary | ICD-10-CM

## 2020-02-08 DIAGNOSIS — M6281 Muscle weakness (generalized): Secondary | ICD-10-CM

## 2020-02-08 DIAGNOSIS — G8929 Other chronic pain: Secondary | ICD-10-CM

## 2020-02-08 DIAGNOSIS — M25561 Pain in right knee: Secondary | ICD-10-CM

## 2020-02-08 NOTE — Therapy (Signed)
[place], [place], [address] Phone: [phone]   Fax:  [phone]  Physical Therapy Treatment  Patient Details  Name: [name] MRN: [id number] Date of Birth: 1951-03-13 Referring Provider (PT): Laurine Blazer   Encounter Date: 02/08/2020   PT End of Session - 02/08/20 0843    Visit Number 19    Date for PT Re-Evaluation 02/25/20    PT Start Time 0804    PT Stop Time 0844    PT Time Calculation (min) 40 min    Activity Tolerance Patient tolerated treatment well;Patient limited by fatigue    Behavior During Therapy [facility] for tasks assessed/performed           Past Medical History:  Diagnosis Date  . Arthritis   . Asthma   . Blood transfusion    hx of last one in 1987  . Breast cancer [facility]) 2013   Right Breast Cancer  . Cancer of lower-inner quadrant of female breast ([place]) 07/24/2011   right breast cancer /IDC,stage IIB,er/pr=+,her2=Neg  . Glaucoma   . Heart murmur   . History of chemotherapy    taxotere/cytoxan 6 cycles 09/22/11-01/05/12 day 2 neulasta  . Hyperlipemia   . Hypertension   . Lymphedema    right arm wears a sleeve  . MRSA (methicillin resistant staph aureus) culture positive    08/2011  . Neuromuscular disorder ([place])    peripheral neuropathy hands feet  . OSA (obstructive sleep apnea)    CPAP settings- ?   . Persistent atrial fibrillation ([place])   . Pituitary abnormality ([place]) 05-25-13   small pituitary growth-appears stable- Dr. Christella Noa follows    Past Surgical History:  Procedure Laterality Date  . ABDOMINAL HYSTERECTOMY  2007   TAH/BSO  . BREAST SURGERY     mastectomy RIGHT  . CARDIOVERSION N/A 06/02/2017   Procedure: CARDIOVERSION;  Surgeon: Dorothy Spark, MD;  Location: [facility];  Service: Cardiovascular;  Laterality: N/A;  . CARPAL TUNNEL RELEASE Right    2012  . CHOLECYSTECTOMY  1987   open - Dr Lindon Romp  . COLONOSCOPY N/A 06/13/2013    Procedure: COLONOSCOPY;  Surgeon: Juanita Craver, MD;  [facility];  Service: Endoscopy;  Laterality: N/A;  . DILATION AND CURETTAGE OF UTERUS     x3  . EYE SURGERY     laser eye surgery for glaucoma  . MASTECTOMY MODIFIED RADICAL Right 08/14/11   right , ER/PR +, HER2 -  . port-a-cath insertion     port-a-cath removal 1'14  . PORT-A-CATH REMOVAL  06/22/2012   Procedure: MINOR REMOVAL PORT-A-CATH;  Surgeon: Haywood Lasso, MD;  Location: [place];  Service: General;  Laterality: Left;  . PORTACATH PLACEMENT  09/11/2011   Procedure: INSERTION PORT-A-CATH;  Surgeon: Joyice Faster. Cornett, MD;  [facility];  Service: General;  Laterality: N/A;  Insert of Port  . TUBAL LIGATION      There were no vitals filed for this visit.   Subjective Assessment - 02/08/20 0808    Subjective Pt reports feeling better. States that R toe is not as sore today    Currently in Pain? Yes    Pain Score 4     Pain Location Knee    Pain Orientation Left                             [facility] Adult PT Treatment/Exercise -  02/08/20 0001      Knee/Hip Exercises: Aerobic   Nustep L5 x 7 min    Other Aerobic UBE 2 min fwd/2 min bkwd      Knee/Hip Exercises: Standing   Heel Raises Both;1 set;10 reps    Heel Raises Limitations on black bar    Forward Step Up Both;2 sets;5 reps;Hand Hold: 2;Step Height: 4"    Other Standing Knee Exercises shoulder ext and rows 10# 2x10      Knee/Hip Exercises: Seated   Long Arc Quad Both;2 sets;15 reps;Strengthening    Long Arc Quad Limitations 2.5#    Marching Both;2 sets;10 reps    Marching Limitations 2.5#    Sit to [employer] 2 sets;5 reps;without UE support   with yellow ball chest press                   PT Short Term Goals - 11/04/19 0850      PT SHORT TERM GOAL #1   Title Pt will be independent with HEP    Status Achieved             PT Long Term Goals - 01/26/20 0817      PT LONG TERM GOAL #1   Title Pt  will demonstrate TUG time <15 seconds with LRAD    Baseline 20 sec no AD    Status Partially Met      PT LONG TERM GOAL #2   Title Pt will demonstrate B hip abd/ext strength 4+/5    Status Partially Met      PT LONG TERM GOAL #3   Title Pt will demonstrate 10 STS from chair with no UE support    Status Achieved      PT LONG TERM GOAL #4   Title Pt will ascend/descend 2 stairs with no HR and supervision assist in order to be able to access home safely    Baseline 2 stairs with HR; difficulty with foot clearance coming down    Status Partially Met      PT LONG TERM GOAL #5   Title Pt will ambulate >557f over level and unlevel surfaces with no LOB or increase in knee pain    Status On-going                 Plan - 02/08/20 0844    Clinical Impression Statement Pt reports great toe is feeling better this rx; able to tolerate more standing ex's but required frequent rest breaks. Progressed to standing rows/extensions to work on standing/WB tolerance activities. Pt with significant compensations and excessive leaning on UEs during step ups/step downs.    PT Treatment/Interventions ADLs/Self Care Home Management;Electrical Stimulation;Cryotherapy;Iontophoresis 440mml Dexamethasone;Moist Heat;Ultrasound;Neuromuscular re-education;Balance training;Therapeutic exercise;Therapeutic activities;Functional mobility training;Stair training;Gait training;Patient/family education;Manual techniques;Passive range of motion;Taping;Vasopneumatic Device    PT Next Visit Plan LE strengthening and endurance; stair training; functional strengthening    Consulted and Agree with Plan of Care Patient           Patient will benefit from skilled therapeutic intervention in order to improve the following deficits and impairments:  Abnormal gait, Decreased range of motion, Difficulty walking, Decreased endurance, Obesity, Decreased activity tolerance, Pain, Decreased balance, Hypomobility, Improper body  mechanics, Impaired flexibility, Decreased strength, Increased edema, Postural dysfunction  Visit Diagnosis: Muscle weakness (generalized)  Chronic pain of right knee  Chronic pain of left knee     Problem List Patient Active Problem List   Diagnosis Date Noted  . Chronic atrial fibrillation ([place]  .  Osteopenia 02/15/2016  . Obesity 01/19/2014  . Neuromuscular disorder ([place])   . Breast cancer of lower-inner quadrant of right female breast ([place]) 07/24/2011  . Murmur 06/17/2011  . Mixed hyperlipidemia 06/17/2011  . Obstructive sleep apnea 11/16/2008  . Essential hypertension 11/16/2008   Amador Cunas, PT, DPT Donald Prose Antavion Bartoszek 02/08/2020, 8:45 AM  [address] [name], [place], [address] Phone: [phone]   Fax:  [phone]  Name: [name] MRN: [id number] Date of Birth: Aug 17, 1950

## 2020-02-14 ENCOUNTER — Ambulatory Visit: Payer: Medicare Other | Admitting: Physical Therapy

## 2020-02-14 ENCOUNTER — Other Ambulatory Visit: Payer: Self-pay

## 2020-02-14 ENCOUNTER — Encounter: Payer: Self-pay | Admitting: Physical Therapy

## 2020-02-14 DIAGNOSIS — M6281 Muscle weakness (generalized): Secondary | ICD-10-CM

## 2020-02-14 DIAGNOSIS — G8929 Other chronic pain: Secondary | ICD-10-CM

## 2020-02-14 DIAGNOSIS — M25561 Pain in right knee: Secondary | ICD-10-CM

## 2020-02-14 DIAGNOSIS — M25562 Pain in left knee: Secondary | ICD-10-CM

## 2020-02-14 NOTE — Therapy (Signed)
[place]. [place], [place], [address] Phone: [phone]   Fax:  [phone]  Physical Therapy Treatment Progress Note Reporting Period 01/26/2020 to 02/14/2020  See note below for Objective Data and Assessment of Progress/Goals.      Patient Details  Name: [name] MRN: [id number] Date of Birth: Apr 07, 1951 Referring Provider (PT): Laurine Blazer   Encounter Date: 02/14/2020   PT End of Session - 02/14/20 0843    Visit Number 20    Date for PT Re-Evaluation 02/25/20    PT Start Time 0802    PT Stop Time 2423    PT Time Calculation (min) 42 min    Activity Tolerance Patient tolerated treatment well;Patient limited by fatigue    Behavior During Therapy [facility] for tasks assessed/performed           Past Medical History:  Diagnosis Date   Arthritis    Asthma    Blood transfusion    hx of last one in 1987   Breast cancer ([place]) 2013   Right Breast Cancer   Cancer of lower-inner quadrant of female breast ([place]) 07/24/2011   right breast cancer /IDC,stage IIB,er/pr=+,her2=Neg   Glaucoma    Heart murmur    History of chemotherapy    taxotere/cytoxan 6 cycles 09/22/11-01/05/12 day 2 neulasta   Hyperlipemia    Hypertension    Lymphedema    right arm wears a sleeve   MRSA (methicillin resistant staph aureus) culture positive    08/2011   Neuromuscular disorder ([place])    peripheral neuropathy hands feet   OSA (obstructive sleep apnea)    CPAP settings- ?    Persistent atrial fibrillation (HCC)    Pituitary abnormality ([place]) 05-25-13   small pituitary growth-appears stable- Dr. Christella Noa follows    Past Surgical History:  Procedure Laterality Date   ABDOMINAL HYSTERECTOMY  2007   TAH/BSO   BREAST SURGERY     mastectomy RIGHT   CARDIOVERSION N/A 06/02/2017   Procedure: CARDIOVERSION;  Surgeon: Dorothy Spark, MD;  Location: [place];  Service: Cardiovascular;  Laterality: N/A;    CARPAL TUNNEL RELEASE Right    2012   CHOLECYSTECTOMY  1987   open - Dr Lindon Romp   COLONOSCOPY N/A 06/13/2013   Procedure: COLONOSCOPY;  Surgeon: Juanita Craver, MD;  [facility];  Service: Endoscopy;  Laterality: N/A;   DILATION AND CURETTAGE OF UTERUS     x3   EYE SURGERY     laser eye surgery for glaucoma   MASTECTOMY MODIFIED RADICAL Right 08/14/11   right , ER/PR +, HER2 -   port-a-cath insertion     port-a-cath removal 1'14   PORT-A-CATH REMOVAL  06/22/2012   Procedure: MINOR REMOVAL PORT-A-CATH;  Surgeon: Haywood Lasso, MD;  Location: [place];  Service: General;  Laterality: Left;   PORTACATH PLACEMENT  09/11/2011   Procedure: INSERTION PORT-A-CATH;  Surgeon: Joyice Faster. Cornett, MD;  [facility];  Service: General;  Laterality: N/A;  Insert of Port   TUBAL LIGATION      There were no vitals filed for this visit.   Subjective Assessment - 02/14/20 0806    Subjective Pt reports feeling a little fatigued this morning    Currently in Pain? Yes    Pain Score 4     Pain Location Knee    Pain Orientation Right              [facility] PT Assessment -  02/14/20 0001      Strength   Overall Strength Comments hip  BIL 4-/5 ext, 4+/5 abd                         [facility] Adult PT Treatment/Exercise - 02/14/20 0001      Standardized Balance Assessment   Standardized Balance Assessment Timed Up and Go Test      Timed Up and Go Test   TUG Normal TUG    Normal TUG (seconds) 22   no AD     Knee/Hip Exercises: Aerobic   Nustep L5 x 7 min    Other Aerobic UBE 2 min fwd/2 min bkwd      Knee/Hip Exercises: Standing   Other Standing Knee Exercises standing hip abduction/extension 2.5# 2x10 B    Other Standing Knee Exercises stairs 4" 1 HR up/down x4      Knee/Hip Exercises: Seated   Long Arc Quad Both;2 sets;15 reps;Strengthening    Long Arc Quad Limitations 2.5#    Marching Both;2 sets;10 reps    Marching Limitations 2.5#                      PT Short Term Goals - 11/04/19 0850      PT SHORT TERM GOAL #1   Title Pt will be independent with HEP    Status Achieved             PT Long Term Goals - 02/14/20 0824      PT LONG TERM GOAL #1   Title Pt will demonstrate TUG time <15 seconds with LRAD    Baseline 20 sec no AD    Status Partially Met      PT LONG TERM GOAL #2   Title Pt will demonstrate B hip abd/ext strength 4+/5    Status Partially Met      PT LONG TERM GOAL #3   Title Pt will demonstrate 10 STS from chair with no UE support    Status Achieved      PT LONG TERM GOAL #4   Title Pt will ascend/descend 2 stairs with 1 HR and supervision assist in order to be able to access home safely    Baseline 2 stairs with HR; difficulty with foot clearance coming down    Status Achieved      PT LONG TERM GOAL #5   Title Pt will ambulate >549f over level and unlevel surfaces with no LOB or increase in knee pain    Status On-going                 Plan - 02/14/20 0844    Clinical Impression Statement Pt is making some progress toward goals; pt demos improved stability with stairs this rx. Still has significant difficulty with eccentric control down stairs and leans excessively on UE. Pt demos sig endurance deficits. Discussed possible d/c next week with updated HEP or continue with reduced frequency with pt VU.    PT Treatment/Interventions ADLs/Self Care Home Management;Electrical Stimulation;Cryotherapy;Iontophoresis 430mml Dexamethasone;Moist Heat;Ultrasound;Neuromuscular re-education;Balance training;Therapeutic exercise;Therapeutic activities;Functional mobility training;Stair training;Gait training;Patient/family education;Manual techniques;Passive range of motion;Taping;Vasopneumatic Device    PT Next Visit Plan Focus on functional strength and endurance    Consulted and Agree with Plan of Care Patient           Patient will benefit from skilled therapeutic intervention in  order to improve the following deficits and impairments:  Abnormal gait, Decreased range of  motion, Difficulty walking, Decreased endurance, Obesity, Decreased activity tolerance, Pain, Decreased balance, Hypomobility, Improper body mechanics, Impaired flexibility, Decreased strength, Increased edema, Postural dysfunction  Visit Diagnosis: Muscle weakness (generalized)  Chronic pain of right knee  Chronic pain of left knee     Problem List Patient Active Problem List   Diagnosis Date Noted   Chronic atrial fibrillation ([place])    Osteopenia 02/15/2016   Obesity 01/19/2014   Neuromuscular disorder ([place])    Breast cancer of lower-inner quadrant of right female breast ([place]) 07/24/2011   Murmur 06/17/2011   Mixed hyperlipidemia 06/17/2011   Obstructive sleep apnea 11/16/2008   Essential hypertension 11/16/2008   Amador Cunas, PT, DPT Donald Prose Kursten Kruk 02/14/2020, 8:48 AM  [place]. [place], [place], [address] Phone: [phone]   Fax:  [phone]  Name: [name] MRN: [id number] Date of Birth: 06-17-1950

## 2020-02-16 ENCOUNTER — Encounter: Payer: Self-pay | Admitting: Physical Therapy

## 2020-02-16 ENCOUNTER — Ambulatory Visit: Payer: Medicare Other | Admitting: Physical Therapy

## 2020-02-16 ENCOUNTER — Other Ambulatory Visit: Payer: Self-pay

## 2020-02-16 DIAGNOSIS — M6281 Muscle weakness (generalized): Secondary | ICD-10-CM | POA: Diagnosis not present

## 2020-02-16 DIAGNOSIS — M25561 Pain in right knee: Secondary | ICD-10-CM

## 2020-02-16 DIAGNOSIS — G8929 Other chronic pain: Secondary | ICD-10-CM

## 2020-02-16 NOTE — Therapy (Signed)
[place]. [place], [place], [address] Phone: [phone]   Fax:  [phone]  Physical Therapy Treatment  Patient Details  Name: [name] MRN: [id number] Date of Birth: 1951-03-29 Referring Provider (PT): Laurine Blazer   Encounter Date: 02/16/2020   PT End of Session - 02/16/20 0844    Visit Number 21    Date for PT Re-Evaluation 02/25/20    PT Start Time 0803    PT Stop Time 0845    PT Time Calculation (min) 42 min    Activity Tolerance Patient tolerated treatment well;Patient limited by fatigue    Behavior During Therapy [facility] for tasks assessed/performed           Past Medical History:  Diagnosis Date  . Arthritis   . Asthma   . Blood transfusion    hx of last one in 1987  . Breast cancer [facility]) 2013   Right Breast Cancer  . Cancer of lower-inner quadrant of female breast ([place]) 07/24/2011   right breast cancer /IDC,stage IIB,er/pr=+,her2=Neg  . Glaucoma   . Heart murmur   . History of chemotherapy    taxotere/cytoxan 6 cycles 09/22/11-01/05/12 day 2 neulasta  . Hyperlipemia   . Hypertension   . Lymphedema    right arm wears a sleeve  . MRSA (methicillin resistant staph aureus) culture positive    08/2011  . Neuromuscular disorder ([place])    peripheral neuropathy hands feet  . OSA (obstructive sleep apnea)    CPAP settings- ?   . Persistent atrial fibrillation ([place])   . Pituitary abnormality ([place]) 05-25-13   small pituitary growth-appears stable- Dr. Christella Noa follows    Past Surgical History:  Procedure Laterality Date  . ABDOMINAL HYSTERECTOMY  2007   TAH/BSO  . BREAST SURGERY     mastectomy RIGHT  . CARDIOVERSION N/A 06/02/2017   Procedure: CARDIOVERSION;  Surgeon: Dorothy Spark, MD;  Location: [place];  Service: Cardiovascular;  Laterality: N/A;  . CARPAL TUNNEL RELEASE Right    2012  . CHOLECYSTECTOMY  1987   open - Dr Lindon Romp  . COLONOSCOPY N/A 06/13/2013   Procedure:  COLONOSCOPY;  Surgeon: Juanita Craver, MD;  [facility];  Service: Endoscopy;  Laterality: N/A;  . DILATION AND CURETTAGE OF UTERUS     x3  . EYE SURGERY     laser eye surgery for glaucoma  . MASTECTOMY MODIFIED RADICAL Right 08/14/11   right , ER/PR +, HER2 -  . port-a-cath insertion     port-a-cath removal 1'14  . PORT-A-CATH REMOVAL  06/22/2012   Procedure: MINOR REMOVAL PORT-A-CATH;  Surgeon: Haywood Lasso, MD;  Location: [place];  Service: General;  Laterality: Left;  . PORTACATH PLACEMENT  09/11/2011   Procedure: INSERTION PORT-A-CATH;  Surgeon: Joyice Faster. Cornett, MD;  [facility];  Service: General;  Laterality: N/A;  Insert of Port  . TUBAL LIGATION      There were no vitals filed for this visit.   Subjective Assessment - 02/16/20 0808    Subjective Pt reports feeling pretty good today    Currently in Pain? Yes    Pain Score 3     Pain Location Knee    Pain Orientation Right                             [facility] Adult PT Treatment/Exercise - 02/16/20 0001      Knee/Hip  Exercises: Aerobic   Nustep L5 x 7 min    Other Aerobic UBE 2 min fwd/2 min bkwd      Knee/Hip Exercises: Standing   Heel Raises Both;1 set;10 reps    Heel Raises Limitations on black bar    Forward Step Up Both;2 sets;5 reps;Hand Hold: 2;Step Height: 4"    Forward Step Up Limitations cues to decrease lateral lean and hip compensations    Other Standing Knee Exercises alt step taps 6" 2x10 B      Knee/Hip Exercises: Seated   Long Arc Quad Both;2 sets;10 reps    Long Arc Quad Limitations 3#    Marching Both;2 sets;10 reps    Marching Limitations 3#    Sit to [employer] 5 reps;without UE support;3 sets   yellow ball chest press                   PT Short Term Goals - 11/04/19 0850      PT SHORT TERM GOAL #1   Title Pt will be independent with HEP    Status Achieved             PT Long Term Goals - 02/14/20 0824      PT LONG TERM GOAL  #1   Title Pt will demonstrate TUG time <15 seconds with LRAD    Baseline 20 sec no AD    Status Partially Met      PT LONG TERM GOAL #2   Title Pt will demonstrate B hip abd/ext strength 4+/5    Status Partially Met      PT LONG TERM GOAL #3   Title Pt will demonstrate 10 STS from chair with no UE support    Status Achieved      PT LONG TERM GOAL #4   Title Pt will ascend/descend 2 stairs with 1 HR and supervision assist in order to be able to access home safely    Baseline 2 stairs with HR; difficulty with foot clearance coming down    Status Achieved      PT LONG TERM GOAL #5   Title Pt will ambulate >527f over level and unlevel surfaces with no LOB or increase in knee pain    Status On-going                 Plan - 02/16/20 0845    Clinical Impression Statement Increased amount of time spent standing this rx with standing ex's blocked together to work on endurance and functional strength. Pt requires freq rest breaks d/t fatigue. Discussed putting pt on hold next rx; pt will return to MD on 02/22/20 and will call if she needs more appts with pt VU.    PT Treatment/Interventions ADLs/Self Care Home Management;Electrical Stimulation;Cryotherapy;Iontophoresis 428mml Dexamethasone;Moist Heat;Ultrasound;Neuromuscular re-education;Balance training;Therapeutic exercise;Therapeutic activities;Functional mobility training;Stair training;Gait training;Patient/family education;Manual techniques;Passive range of motion;Taping;Vasopneumatic Device    PT Next Visit Plan Focus on functional strength and endurance    Consulted and Agree with Plan of Care Patient           Patient will benefit from skilled therapeutic intervention in order to improve the following deficits and impairments:  Abnormal gait, Decreased range of motion, Difficulty walking, Decreased endurance, Obesity, Decreased activity tolerance, Pain, Decreased balance, Hypomobility, Improper body mechanics, Impaired  flexibility, Decreased strength, Increased edema, Postural dysfunction  Visit Diagnosis: Muscle weakness (generalized)  Chronic pain of right knee  Chronic pain of left knee     Problem List Patient Active Problem  List   Diagnosis Date Noted  . Chronic atrial fibrillation ([place])   . Osteopenia 02/15/2016  . Obesity 01/19/2014  . Neuromuscular disorder ([place])   . Breast cancer of lower-inner quadrant of right female breast ([place]) 07/24/2011  . Murmur 06/17/2011  . Mixed hyperlipidemia 06/17/2011  . Obstructive sleep apnea 11/16/2008  . Essential hypertension 11/16/2008   Amador Cunas, PT, DPT Donald Prose Merritt Kibby 02/16/2020, 8:47 AM  [place]. [place], [place], [address] Phone: [phone]   Fax:  [phone]  Name: ALLYNA PITTSLEY MRN: [id number] Date of Birth: Aug 14, 1950

## 2020-02-21 ENCOUNTER — Ambulatory Visit: Payer: Medicare Other | Admitting: Physical Therapy

## 2020-02-21 ENCOUNTER — Other Ambulatory Visit: Payer: Self-pay

## 2020-02-21 ENCOUNTER — Encounter: Payer: Self-pay | Admitting: Physical Therapy

## 2020-02-21 DIAGNOSIS — M6281 Muscle weakness (generalized): Secondary | ICD-10-CM | POA: Diagnosis not present

## 2020-02-21 DIAGNOSIS — M25562 Pain in left knee: Secondary | ICD-10-CM

## 2020-02-21 DIAGNOSIS — G8929 Other chronic pain: Secondary | ICD-10-CM

## 2020-02-21 NOTE — Therapy (Signed)
[place]. [place], [place], [address] Phone: [phone]   Fax:  [phone]  Physical Therapy Treatment  Patient Details  Name: [name] MRN: [id number] Date of Birth: [birth year] Referring Provider (PT): Laurine Blazer   Encounter Date: 02/21/2020   PT End of Session - 02/21/20 0926    Visit Number 22    Date for PT Re-Evaluation 02/25/20    PT Start Time 0858    PT Stop Time 0930    PT Time Calculation (min) 32 min    Activity Tolerance Patient tolerated treatment well;Patient limited by fatigue    Behavior During Therapy [facility] for tasks assessed/performed           Past Medical History:  Diagnosis Date  . Arthritis   . Asthma   . Blood transfusion    hx of last one in 1987  . Breast cancer [facility]) 2013   Right Breast Cancer  . Cancer of lower-inner quadrant of female breast ([place]) 07/24/2011   right breast cancer /IDC,stage IIB,er/pr=+,her2=Neg  . Glaucoma   . Heart murmur   . History of chemotherapy    taxotere/cytoxan 6 cycles 09/22/11-01/05/12 day 2 neulasta  . Hyperlipemia   . Hypertension   . Lymphedema    right arm wears a sleeve  . MRSA (methicillin resistant staph aureus) culture positive    08/2011  . Neuromuscular disorder ([place])    peripheral neuropathy hands feet  . OSA (obstructive sleep apnea)    CPAP settings- ?   . Persistent atrial fibrillation ([place])   . Pituitary abnormality ([place]) 05-25-13   small pituitary growth-appears stable- Dr. Christella Noa follows    Past Surgical History:  Procedure Laterality Date  . ABDOMINAL HYSTERECTOMY  2007   TAH/BSO  . BREAST SURGERY     mastectomy RIGHT  . CARDIOVERSION N/A 06/02/2017   Procedure: CARDIOVERSION;  Surgeon: Dorothy Spark, MD;  Location: [place];  Service: Cardiovascular;  Laterality: N/A;  . CARPAL TUNNEL RELEASE Right    2012  . CHOLECYSTECTOMY  1987   open - Dr Lindon Romp  . COLONOSCOPY N/A 06/13/2013   Procedure:  COLONOSCOPY;  Surgeon: Juanita Craver, MD;  [facility];  Service: Endoscopy;  Laterality: N/A;  . DILATION AND CURETTAGE OF UTERUS     x3  . EYE SURGERY     laser eye surgery for glaucoma  . MASTECTOMY MODIFIED RADICAL Right 08/14/11   right , ER/PR +, HER2 -  . port-a-cath insertion     port-a-cath removal 1'14  . PORT-A-CATH REMOVAL  06/22/2012   Procedure: MINOR REMOVAL PORT-A-CATH;  Surgeon: Haywood Lasso, MD;  Location: [place];  Service: General;  Laterality: Left;  . PORTACATH PLACEMENT  09/11/2011   Procedure: INSERTION PORT-A-CATH;  Surgeon: Joyice Faster. Cornett, MD;  [facility];  Service: General;  Laterality: N/A;  Insert of Port  . TUBAL LIGATION      There were no vitals filed for this visit.   Subjective Assessment - 02/21/20 0902    Subjective Rushing this morning, pt reports increase fatigue and her breathing is off    Currently in Pain? Yes    Pain Score 4     Pain Location Knee    Pain Orientation Left                             [facility] Adult PT Treatment/Exercise - 02/21/20 0001  Knee/Hip Exercises: Aerobic   Nustep L3 x 5 min    Other Aerobic UBE 2 min fwd/2 min bkwd      Knee/Hip Exercises: Standing   Other Standing Knee Exercises alt step taps 6" x5 each      Knee/Hip Exercises: Seated   Long Arc Quad Both;2 sets;10 reps    Long Arc Quad Limitations 3#    Marching Both;2 sets;10 reps    Marching Limitations 3#    Sit to [employer] 1 set;5 reps;without UE support                    PT Short Term Goals - 11/04/19 0850      PT SHORT TERM GOAL #1   Title Pt will be independent with HEP    Status Achieved             PT Long Term Goals - 02/14/20 0824      PT LONG TERM GOAL #1   Title Pt will demonstrate TUG time <15 seconds with LRAD    Baseline 20 sec no AD    Status Partially Met      PT LONG TERM GOAL #2   Title Pt will demonstrate B hip abd/ext strength 4+/5    Status  Partially Met      PT LONG TERM GOAL #3   Title Pt will demonstrate 10 STS from chair with no UE support    Status Achieved      PT LONG TERM GOAL #4   Title Pt will ascend/descend 2 stairs with 1 HR and supervision assist in order to be able to access home safely    Baseline 2 stairs with HR; difficulty with foot clearance coming down    Status Achieved      PT LONG TERM GOAL #5   Title Pt will ambulate >524f over level and unlevel surfaces with no LOB or increase in knee pain    Status On-going                 Plan - 02/21/20 0927    Clinical Impression Statement Pt ~ 14 minutes late today reporting increase fatigue and L knee pain. Limited interventions due to the decrease time. She did report that her knee pain did go away with the exercises. Some compensation with 6 inch toe taps.    Personal Factors and Comorbidities Age;Comorbidity 3+;Time since onset of injury/illness/exacerbation;Fitness    Comorbidities HTN, asthma, afib    Examination-Activity Limitations Bend;Lift;Stand;Stairs;Squat;Locomotion Level;Transfers    Examination-Participation Restrictions Church;Cleaning;Community Activity;Volunteer    Stability/Clinical Decision Making Stable/Uncomplicated    Rehab Potential Good    PT Duration 8 weeks    PT Treatment/Interventions ADLs/Self Care Home Management;Electrical Stimulation;Cryotherapy;Iontophoresis 444mml Dexamethasone;Moist Heat;Ultrasound;Neuromuscular re-education;Balance training;Therapeutic exercise;Therapeutic activities;Functional mobility training;Stair training;Gait training;Patient/family education;Manual techniques;Passive range of motion;Taping;Vasopneumatic Device    PT Next Visit Plan Focus on functional strength and endurance           Patient will benefit from skilled therapeutic intervention in order to improve the following deficits and impairments:  Abnormal gait, Decreased range of motion, Difficulty walking, Decreased endurance,  Obesity, Decreased activity tolerance, Pain, Decreased balance, Hypomobility, Improper body mechanics, Impaired flexibility, Decreased strength, Increased edema, Postural dysfunction  Visit Diagnosis: Chronic pain of right knee  Chronic pain of left knee  Muscle weakness (generalized)     Problem List Patient Active Problem List   Diagnosis Date Noted  . Chronic atrial fibrillation ([place]  . Osteopenia 02/15/2016  .  Obesity 01/19/2014  . Neuromuscular disorder ([place])   . Breast cancer of lower-inner quadrant of right female breast ([place]) 07/24/2011  . Murmur 06/17/2011  . Mixed hyperlipidemia 06/17/2011  . Obstructive sleep apnea 11/16/2008  . Essential hypertension 11/16/2008    Scot Jun, PTA 02/21/2020, 9:30 AM  [place]. [place], [place], [address] Phone: [phone]   Fax:  [phone]  Name: [name] MRN: [id number] Date of Birth: 02-Aug-1950

## 2020-02-22 ENCOUNTER — Encounter: Payer: Self-pay | Admitting: Pulmonary Disease

## 2020-02-22 ENCOUNTER — Ambulatory Visit (INDEPENDENT_AMBULATORY_CARE_PROVIDER_SITE_OTHER): Payer: Medicare Other | Admitting: Pulmonary Disease

## 2020-02-22 VITALS — BP 122/64 | HR 87 | Temp 97.3°F | Ht 62.0 in | Wt 297.6 lb

## 2020-02-22 DIAGNOSIS — G4730 Sleep apnea, unspecified: Secondary | ICD-10-CM | POA: Diagnosis not present

## 2020-02-22 DIAGNOSIS — G4733 Obstructive sleep apnea (adult) (pediatric): Secondary | ICD-10-CM | POA: Diagnosis not present

## 2020-02-22 DIAGNOSIS — E669 Obesity, unspecified: Secondary | ICD-10-CM

## 2020-02-22 DIAGNOSIS — Z23 Encounter for immunization: Secondary | ICD-10-CM | POA: Diagnosis not present

## 2020-02-22 DIAGNOSIS — Z9989 Dependence on other enabling machines and devices: Secondary | ICD-10-CM

## 2020-02-22 NOTE — Progress Notes (Signed)
[facility]  Chief Complaint  Patient presents with   Follow-up    osa,denies problems    Constitutional:  BP 122/64 (BP Location: Left Arm, Cuff Size: Large)    Pulse 87    Temp (!) 97.3 F (36.3 C) (Skin)    Ht 5\' 2"  (1.575 m)    Wt 297 lb 9.6 oz (135 kg)    SpO2 98%    BMI 54.43 kg/m   Past Medical History:  Pituitary adenoma, Peripheral neuropathy, Lymphedema Rt arm, HTN, HLD, Glaucoma, Rt breast cancer 2013, Asthma, OA, Persistent A fib  Past Surgical History:  Her  has a past surgical history that includes Tubal ligation; Dilation and curettage of uterus; Carpal tunnel release (Right); Cholecystectomy (1987); Abdominal hysterectomy (2007); Breast surgery; Portacath placement (09/11/2011); Port-a-cath removal (06/22/2012); Eye surgery; port-a-cath insertion; Colonoscopy (N/A, 06/13/2013); Cardioversion (N/A, 06/02/2017); and Mastectomy modified radical (Right, 08/14/11).  Brief Summary:  [name] is a 69 y.o. female with obstructive sleep apnea.      Subjective:   Uses CPAP nightly.  Has full face mask.  Occasional runny nose and dry mouth, but not bad.  Feels rested.  She has problems with her knee.  She has been working with PT and got knee injected.  Physical Exam:   Appearance - well kempt   ENMT - no sinus tenderness, no oral exudate, no LAN, Mallampati 4 airway, no stridor  Respiratory - equal breath sounds bilaterally, no wheezing or rales  CV - s1s2 regular rate and rhythm, no murmurs  Ext - no clubbing, no edema  Skin - no rashes  Psych - normal mood and affect   Sleep Tests:   PSG 06/09/97 >> AHI 37  ONO with CPAP 11/30/17 >>test time 6 hrs. Baseline SpO2 95%, low SpO2 83%. Spent 7 min with SpO2 <88%.  Auto CPAP 01/22/20 to 02/20/20 >> used on 29 of 30 nights with average 5 hrs 55 min.  Average AHI 1.8 with median CPAP 8 and 95 th percentile CPAP 11 cm H2O.  Cardiac Tests:   Echo 06/09/17 >> EF 65 to  70%, mild AR, mild MR, PAS 42 mmHg  Social History:  She  reports that she has never smoked. She has never used smokeless tobacco. She reports that she does not drink alcohol and does not use drugs.  Family History:  Her family history includes Arthritis in her mother; Cancer (age of onset: 54) in her father.     Assessment/Plan:   Obstructive sleep apnea. - she is compliant with CPAP and reports benefit - uses [employer] for her DME - continue auto CPAP 5 to 15 cm H2O  Obesity. - she has been working with PT to improve her mobility and stamina  Persistent atrial fibrillation. - followed by Dr. Irish Lack with cardiology  Knee pain. - followed at [facility]  Time Spent Involved in Patient Care on Day of Examination:  22 minutes  Follow up:  Patient Instructions  Follow up in 1 year   Medication List:   Allergies as of 02/22/2020      Reactions   Trandolapril-verapamil Hcl Er Swelling   Causes lips to swell [name]" is the name brand)   Trandolapril-verapamil Hcl Er       Medication List       Accurate as of February 22, 2020  9:24 AM. If you have any questions, ask your nurse or doctor.        STOP taking  these medications   anastrozole 1 MG tablet Commonly known as: ARIMIDEX Stopped by: Chesley Mires, MD     TAKE these medications   acetaminophen 325 MG tablet Commonly known as: TYLENOL Take 325-650 mg by mouth every 6 (six) hours as needed (for pain.).   amLODipine 2.5 MG tablet Commonly known as: NORVASC Take 1 tablet (2.5 mg total) by mouth daily. Please make overdue appt with Dr. Irish Lack before anymore refills. 1st attempt   cabergoline 0.5 MG tablet Commonly known as: DOSTINEX Take 0.5 mg by mouth 2 (two) times a week. Tuesday and Thursday   furosemide 20 MG tablet Commonly known as: LASIX TAKE 1 TABLET DAILY AS NEEDED FOR FLUID   GLUCOSAMINE HCL PO Take 1 tablet by mouth daily.   hydroxypropyl methylcellulose / hypromellose 2.5 %  ophthalmic solution Commonly known as: ISOPTO TEARS / GONIOVISC Place 1 drop into both eyes 3 (three) times daily as needed for dry eyes.   losartan-hydrochlorothiazide 100-25 MG tablet Commonly known as: HYZAAR Take 1 tablet by mouth daily.   metoprolol succinate 50 MG 24 hr tablet Commonly known as: TOPROL-XL TAKE ONE AND ONE-HALF TABLETS TWICE A DAY   multivitamin with minerals Tabs tablet Take 1 tablet by mouth daily with breakfast.   potassium chloride SA 20 MEQ tablet Commonly known as: KLOR-CON Take 2 tablets by mouth daily. Take when taking lasix(Furosemide). Please make overdue appt with Dr. Irish Lack before anymore refills. 1st attempt   ProAir HFA 108 (90 Base) MCG/ACT inhaler Generic drug: albuterol Inhale 2 puffs into the lungs every 6 (six) hours as needed for wheezing or shortness of breath. For shortness of breath   rivaroxaban 20 MG Tabs tablet Commonly known as: Xarelto TAKE 1 TABLET DAILY WITH SUPPER   Simbrinza 1-0.2 % Susp Generic drug: Brinzolamide-Brimonidine Place 1 drop into both eyes 3 (three) times daily.   simvastatin 20 MG tablet Commonly known as: ZOCOR TAKE 1 TABLET AT BEDTIME   Travatan Z 0.004 % Soln ophthalmic solution Generic drug: Travoprost (BAK Free) Place 1 drop into both eyes every evening.   vitamin B-12 500 MCG tablet Commonly known as: CYANOCOBALAMIN Take 500 mcg by mouth daily.   Vitamin D3 25 MCG (1000 UT) Caps Take 1,000 Units by mouth every other day.       Signature:  Chesley Mires, MD [facility] Pager - [phone] 02/22/2020, 9:24 AM

## 2020-02-22 NOTE — Addendum Note (Signed)
Addended by: Elton Sin on: 02/22/2020 09:37 AM   Modules accepted: Orders

## 2020-02-22 NOTE — Patient Instructions (Signed)
Follow up in 1 year.

## 2020-03-25 NOTE — Progress Notes (Signed)
[facility]   [phone] [phone]   Clinic Follow up Note   Patient Care Team: Stephens Shire, MD as PCP - General (Family Medicine) Thompson Grayer, MD as PCP - Electrophysiology (Cardiology) Jettie Booze, MD as PCP - Cardiology (Cardiology) 03/26/2020  CHIEF COMPLAINT: F/u right breast cancer   CURRENT THERAPY: Surveillance   INTERVAL HISTORY: Ms. Fulmore returns for f/u as scheduled. She was last seen by Dr. Burr Medico 03/28/19. Mammogram on 11/10/19 was negative. Last bone density scan on 02/23/17 was normal.  Denies any changes from her health in the last year, she thinks she is doing well.  She is in a wheelchair.  Notes that she has been "winded" since A. fib diagnosis and recent asthma flare, she has not completely recovered.  Denies any cough or chest pain.  No recent fever or chills.  She continues Xarelto, denies bleeding.  She recently traveled to see her mother, legs swell more with travel, takes lasix occasionally.  Baseline right knee pain is stable.  Denies any new bone pain.  Denies changes in her bowel habits, GI/GYN bleeding, abdominal pain.  Denies concerns in her right chest wall or left breast such as new mass, nodule, skin change or nipple discharge.   MEDICAL HISTORY:  Past Medical History:  Diagnosis Date  . Arthritis   . Asthma   . Blood transfusion    hx of last one in 1987  . Breast cancer [facility]) 2013   Right Breast Cancer  . Cancer of lower-inner quadrant of female breast ([place]) 07/24/2011   right breast cancer /IDC,stage IIB,er/pr=+,her2=Neg  . Glaucoma   . Heart murmur   . History of chemotherapy    taxotere/cytoxan 6 cycles 09/22/11-01/05/12 day 2 neulasta  . Hyperlipemia   . Hypertension   . Lymphedema    right arm wears a sleeve  . MRSA (methicillin resistant staph aureus) culture positive    08/2011  . Neuromuscular disorder ([place])    peripheral neuropathy hands feet  . OSA (obstructive sleep apnea)    CPAP  settings- ?   . Persistent atrial fibrillation ([place])   . Pituitary abnormality ([place]) 05-25-13   small pituitary growth-appears stable- Dr. Christella Noa follows    SURGICAL HISTORY: Past Surgical History:  Procedure Laterality Date  . ABDOMINAL HYSTERECTOMY  2007   TAH/BSO  . BREAST SURGERY     mastectomy RIGHT  . CARDIOVERSION N/A 06/02/2017   Procedure: CARDIOVERSION;  Surgeon: Dorothy Spark, MD;  Location: [facility];  Service: Cardiovascular;  Laterality: N/A;  . CARPAL TUNNEL RELEASE Right    2012  . CHOLECYSTECTOMY  1987   open - Dr Lindon Romp  . COLONOSCOPY N/A 06/13/2013   Procedure: COLONOSCOPY;  Surgeon: Juanita Craver, MD;  Location: [facility];  Service: Endoscopy;  Laterality: N/A;  . DILATION AND CURETTAGE OF UTERUS     x3  . EYE SURGERY     laser eye surgery for glaucoma  . MASTECTOMY MODIFIED RADICAL Right 08/14/11   right , ER/PR +, HER2 -  . port-a-cath insertion     port-a-cath removal 1'14  . PORT-A-CATH REMOVAL  06/22/2012   Procedure: MINOR REMOVAL PORT-A-CATH;  Surgeon: Haywood Lasso, MD;  Location: [facility];  Service: General;  Laterality: Left;  . PORTACATH PLACEMENT  09/11/2011   Procedure: INSERTION PORT-A-CATH;  Surgeon: Joyice Faster. Cornett, MD;  Location: [facility];  Service: General;  Laterality: N/A;  Insert of Port  . TUBAL LIGATION  I have reviewed the social history and family history with the patient and they are unchanged from previous note.  ALLERGIES:  is allergic to trandolapril-verapamil hcl er and trandolapril-verapamil hcl er.  MEDICATIONS:  Current Outpatient Medications  Medication Sig Dispense Refill  . acetaminophen (TYLENOL) 325 MG tablet Take 325-650 mg by mouth every 6 (six) hours as needed (for pain.).    Marland Kitchen albuterol (PROAIR HFA) 108 (90 Base) MCG/ACT inhaler Inhale 2 puffs into the lungs every 6 (six) hours as needed for wheezing or shortness of breath. For shortness of breath    . amLODipine (NORVASC) 2.5 MG  tablet Take 1 tablet (2.5 mg total) by mouth daily. Please make overdue appt with Dr. Irish Lack before anymore refills. 1st attempt 30 tablet 0  . cabergoline (DOSTINEX) 0.5 MG tablet Take 0.5 mg by mouth 2 (two) times a week. Tuesday and Thursday    . Cholecalciferol (VITAMIN D3) 1000 UNITS CAPS Take 1,000 Units by mouth every other day.     . furosemide (LASIX) 20 MG tablet TAKE 1 TABLET DAILY AS NEEDED FOR FLUID 90 tablet 3  . GLUCOSAMINE HCL PO Take 1 tablet by mouth daily.    . hydroxypropyl methylcellulose / hypromellose (ISOPTO TEARS / GONIOVISC) 2.5 % ophthalmic solution Place 1 drop into both eyes 3 (three) times daily as needed for dry eyes.    Marland Kitchen losartan-hydrochlorothiazide (HYZAAR) 100-25 MG tablet Take 1 tablet by mouth daily. 90 tablet 3  . metoprolol succinate (TOPROL-XL) 50 MG 24 hr tablet TAKE ONE AND ONE-HALF TABLETS TWICE A DAY 270 tablet 3  . Multiple Vitamin (MULITIVITAMIN WITH MINERALS) TABS Take 1 tablet by mouth daily with breakfast.     . potassium chloride SA (KLOR-CON) 20 MEQ tablet Take 2 tablets by mouth daily. Take when taking lasix(Furosemide). Please make overdue appt with Dr. Irish Lack before anymore refills. 1st attempt 60 tablet 0  . rivaroxaban (XARELTO) 20 MG TABS tablet TAKE 1 TABLET DAILY WITH SUPPER 90 tablet 1  . SIMBRINZA 1-0.2 % SUSP Place 1 drop into both eyes 3 (three) times daily.     . simvastatin (ZOCOR) 20 MG tablet TAKE 1 TABLET AT BEDTIME 90 tablet 3  . TRAVATAN Z 0.004 % SOLN ophthalmic solution Place 1 drop into both eyes every evening.     . vitamin B-12 (CYANOCOBALAMIN) 500 MCG tablet Take 500 mcg by mouth daily.     No current facility-administered medications for this visit.   Facility-Administered Medications Ordered in Other Visits  Medication Dose Route Frequency Provider Last Rate Last Admin  . lidocaine (cardiac) 100 mg/12m (XYLOCAINE) 20 MG/ML injection 2%    Anesthesia Intra-op JMontel Clock CRNA   60 mg at 06/02/17 1343     PHYSICAL EXAMINATION: ECOG PERFORMANCE STATUS: 1-2   Vitals:   03/26/20 0846  BP: (!) 144/95  Pulse: 86  Resp: 18  Temp: 98.1 F (36.7 C)  SpO2: 100%   Filed Weights    GENERAL:alert, no distress and comfortable SKIN: no rash  EYES:  sclera clear NECK: without mass LYMPH:  no palpable supraclavicular lymphadenopathy LUNGS: decreased with expiratory wheezes. Normal breathing effort  HEART: regular rate & rhythm, bilateral lower extremity edema ABDOMEN: abdomen soft, non-tender and normal bowel sounds. No obvious hepatomegaly, exam limited by position and body habitus  NEURO: alert & oriented x 3 with fluent speech Breast exam: s/p right mastectomy. No palpable mass or nodularity along the incision, chest wall, left breast or either axilla that I could appreciate  LABORATORY DATA:  I have reviewed the data as listed CBC Latest Ref Rng & Units 03/26/2020 03/28/2019 10/15/2018  WBC 4.0 - 10.5 K/uL 4.4 5.7 5.7  Hemoglobin 12.0 - 15.0 g/dL 11.7(L) 12.3 12.8  Hematocrit 36 - 46 % 36.9 38.3 39.4  Platelets 150 - 400 K/uL 149(L) 162 175     CMP Latest Ref Rng & Units 03/26/2020 03/28/2019 10/15/2018  Glucose 70 - 99 mg/dL 105(H) 114(H) 103(H)  BUN 8 - 23 mg/dL 16 21 20   Creatinine 0.44 - 1.00 mg/dL 1.17(H) 1.21(H) 0.98  Sodium 135 - 145 mmol/L 142 142 140  Potassium 3.5 - 5.1 mmol/L 3.5 3.7 3.6  Chloride 98 - 111 mmol/L 105 104 100  CO2 22 - 32 mmol/L 28 26 25   Calcium 8.9 - 10.3 mg/dL 9.2 9.6 9.6  Total Protein 6.5 - 8.1 g/dL 6.6 6.9 6.5  Total Bilirubin 0.3 - 1.2 mg/dL 2.8(H) 1.5(H) 1.8(H)  Alkaline Phos 38 - 126 U/L 63 69 79  AST 15 - 41 U/L 12(L) 14(L) 13  ALT 0 - 44 U/L 7 11 10       RADIOGRAPHIC STUDIES: I have personally reviewed the radiological images as listed and agreed with the findings in the report. No results found.   ASSESSMENT & PLAN: 69 yo female with:  1. Right breast cancer  -diagnosed 08/05/2011 [id number] stage IIB invasive ductal carcinoma  -s/p  right simple mastectomy with ALND 08/14/2011 final path revealed invasive and in situ ductal carcinoma, margins negative, focal LVI and 1/15 + LNs, ER/PR + HER2 negative final pT2N1aMx stage IIB -s/p adjuvant chemo with taxotere/cytoxan q3 weeks x6 09/22/11 - 01/05/12 -s/p adjuvant radiation completed 03/2012 -s/p adjuvant anastrozole x7 years completed 03/2019  2. Bone health -DEXA in 2018, normal  -continue calcium and vit D  3. OSA, asthma, Afib, CKD -continue f/u with pulmonology, cardiology, PCP   Disposition:  Ms. Hemler appears stable. Breast exam is benign. Labs show Hg 11.7, plt 149, Scr 1.17, Tbili 2.8. Mammogram in 10/2019 is negative. No clinical concern for breast cancer recurrence. Continue surveillance.   She has acute on chronic dyspnea in the setting of osa and asthma. Breast sounds are significantly decreased with wheezing. O2 sat 100% RA at rest. Due to her limited mobility I did not conduct a 6 min walk test. My nurse called her pulmonologist who could not see her until 11/2. She plans to go home and collect her things and go to ED in [place] for respiratory evaluation.   She has mild anemia and thrombocytopenia. Will proceed with anemia work up including M38, folic acid, and iron studies to r/o nutritional wtiologies. I will check retic, LDH, and haptoglobin to r/o hemolysis in the setting of worsening hyperbilirubinemia. Last ABD [name] in 2019 showed mild liver echogenicity, will repeat. Her anemia may be related to chronic disease (OSA, asthma, CKD, etc) if no other clear etiology. Will f/u.   She will repeat L screening mammogram in 10/2020 and f/u in 1 year, or sooner if needed pending anemia work up. I will call her with results.    Orders Placed This Encounter  Procedures  . US Abdomen Complete    Standing Status:   Future    Standing Expiration Date:   03/26/2021    Order Specific Question:   Reason for Exam (SYMPTOM  OR DIAGNOSIS REQUIRED)    Answer:    hyperbilirubinemia, anemia, thrombocytopenia    Order Specific Question:   Preferred imaging location?  Answer:   [facility]  . Iron and TIBC    Standing Status:   Standing    Number of Occurrences:   1    Standing Expiration Date:   03/26/2021  . Ferritin    Standing Status:   Standing    Number of Occurrences:   1    Standing Expiration Date:   03/26/2021  . Retic Panel    Standing Status:   Standing    Number of Occurrences:   1    Standing Expiration Date:   03/26/2021  . Folate RBC    Standing Status:   Standing    Number of Occurrences:   1    Standing Expiration Date:   03/26/2021  . Folate, Serum    Standing Status:   Standing    Number of Occurrences:   1    Standing Expiration Date:   03/26/2021  . Vitamin B12    Standing Status:   Standing    Number of Occurrences:   1    Standing Expiration Date:   03/26/2021  . Methylmalonic acid, serum    Standing Status:   Standing    Number of Occurrences:   1    Standing Expiration Date:   03/26/2021  . Lactate dehydrogenase (LDH)    Standing Status:   Standing    Number of Occurrences:   1    Standing Expiration Date:   03/26/2021  . Haptoglobin    Standing Status:   Standing    Number of Occurrences:   1    Standing Expiration Date:   03/26/2021   All questions were answered. The patient knows to call the clinic with any problems, questions or concerns. No barriers to learning were detected. Total encounter time was 35 minutes.      Alla Feeling, NP 03/26/20

## 2020-03-26 ENCOUNTER — Telehealth: Payer: Self-pay | Admitting: Pulmonary Disease

## 2020-03-26 ENCOUNTER — Encounter: Payer: Self-pay | Admitting: Nurse Practitioner

## 2020-03-26 ENCOUNTER — Inpatient Hospital Stay: Payer: Medicare Other | Attending: Nurse Practitioner

## 2020-03-26 ENCOUNTER — Inpatient Hospital Stay (HOSPITAL_BASED_OUTPATIENT_CLINIC_OR_DEPARTMENT_OTHER): Payer: Medicare Other | Admitting: Nurse Practitioner

## 2020-03-26 ENCOUNTER — Other Ambulatory Visit: Payer: Self-pay

## 2020-03-26 VITALS — BP 144/95 | HR 86 | Temp 98.1°F | Resp 18 | Ht 62.0 in

## 2020-03-26 DIAGNOSIS — D649 Anemia, unspecified: Secondary | ICD-10-CM

## 2020-03-26 DIAGNOSIS — E669 Obesity, unspecified: Secondary | ICD-10-CM | POA: Diagnosis not present

## 2020-03-26 DIAGNOSIS — Z7901 Long term (current) use of anticoagulants: Secondary | ICD-10-CM | POA: Insufficient documentation

## 2020-03-26 DIAGNOSIS — Z79899 Other long term (current) drug therapy: Secondary | ICD-10-CM | POA: Insufficient documentation

## 2020-03-26 DIAGNOSIS — Z923 Personal history of irradiation: Secondary | ICD-10-CM | POA: Insufficient documentation

## 2020-03-26 DIAGNOSIS — I4891 Unspecified atrial fibrillation: Secondary | ICD-10-CM | POA: Diagnosis not present

## 2020-03-26 DIAGNOSIS — J45909 Unspecified asthma, uncomplicated: Secondary | ICD-10-CM | POA: Diagnosis not present

## 2020-03-26 DIAGNOSIS — Z79811 Long term (current) use of aromatase inhibitors: Secondary | ICD-10-CM | POA: Diagnosis not present

## 2020-03-26 DIAGNOSIS — C50911 Malignant neoplasm of unspecified site of right female breast: Secondary | ICD-10-CM | POA: Insufficient documentation

## 2020-03-26 DIAGNOSIS — Z170 Estrogen receptor positive status [ER+]: Secondary | ICD-10-CM

## 2020-03-26 DIAGNOSIS — N189 Chronic kidney disease, unspecified: Secondary | ICD-10-CM | POA: Diagnosis not present

## 2020-03-26 DIAGNOSIS — M8580 Other specified disorders of bone density and structure, unspecified site: Secondary | ICD-10-CM | POA: Insufficient documentation

## 2020-03-26 DIAGNOSIS — Z9221 Personal history of antineoplastic chemotherapy: Secondary | ICD-10-CM | POA: Insufficient documentation

## 2020-03-26 DIAGNOSIS — Z1231 Encounter for screening mammogram for malignant neoplasm of breast: Secondary | ICD-10-CM

## 2020-03-26 DIAGNOSIS — D539 Nutritional anemia, unspecified: Secondary | ICD-10-CM

## 2020-03-26 DIAGNOSIS — C50311 Malignant neoplasm of lower-inner quadrant of right female breast: Secondary | ICD-10-CM | POA: Diagnosis not present

## 2020-03-26 LAB — COMPREHENSIVE METABOLIC PANEL
ALT: 7 U/L (ref 0–44)
AST: 12 U/L — ABNORMAL LOW (ref 15–41)
Albumin: 3.8 g/dL (ref 3.5–5.0)
Alkaline Phosphatase: 63 U/L (ref 38–126)
Anion gap: 9 (ref 5–15)
BUN: 16 mg/dL (ref 8–23)
CO2: 28 mmol/L (ref 22–32)
Calcium: 9.2 mg/dL (ref 8.9–10.3)
Chloride: 105 mmol/L (ref 98–111)
Creatinine, Ser: 1.17 mg/dL — ABNORMAL HIGH (ref 0.44–1.00)
GFR, Estimated: 50 mL/min — ABNORMAL LOW (ref >60)
Glucose, Bld: 105 mg/dL — ABNORMAL HIGH (ref 70–99)
Potassium: 3.5 mmol/L (ref 3.5–5.1)
Sodium: 142 mmol/L (ref 135–145)
Total Bilirubin: 2.8 mg/dL — ABNORMAL HIGH (ref 0.3–1.2)
Total Protein: 6.6 g/dL (ref 6.5–8.1)

## 2020-03-26 LAB — CBC WITH DIFFERENTIAL/PLATELET
Abs Immature Granulocytes: 0.01 10*3/uL (ref 0.00–0.07)
Basophils Absolute: 0 10*3/uL (ref 0.0–0.1)
Basophils Relative: 1 %
Eosinophils Absolute: 0.1 10*3/uL (ref 0.0–0.5)
Eosinophils Relative: 3 %
HCT: 36.9 % (ref 36.0–46.0)
Hemoglobin: 11.7 g/dL — ABNORMAL LOW (ref 12.0–15.0)
Immature Granulocytes: 0 %
Lymphocytes Relative: 35 %
Lymphs Abs: 1.5 10*3/uL (ref 0.7–4.0)
MCH: 30.2 pg (ref 26.0–34.0)
MCHC: 31.7 g/dL (ref 30.0–36.0)
MCV: 95.3 fL (ref 80.0–100.0)
Monocytes Absolute: 0.5 10*3/uL (ref 0.1–1.0)
Monocytes Relative: 11 %
Neutro Abs: 2.3 10*3/uL (ref 1.7–7.7)
Neutrophils Relative %: 50 %
Platelets: 149 10*3/uL — ABNORMAL LOW (ref 150–400)
RBC: 3.87 MIL/uL (ref 3.87–5.11)
RDW: 13.8 % (ref 11.5–15.5)
WBC: 4.4 10*3/uL (ref 4.0–10.5)
nRBC: 0 % (ref 0.0–0.2)

## 2020-03-26 NOTE — Telephone Encounter (Signed)
Colletta Maryland called back and requested patient be called back at [phone] or [phone]

## 2020-03-26 NOTE — Telephone Encounter (Signed)
Pt called back and an appt was scheduled for her to be seen by TP 11/3. Nothing further needed.

## 2020-03-26 NOTE — Telephone Encounter (Signed)
Attempted to call pt to get her scheduled for an appt but unable to reach. Left message for her to return call.

## 2020-03-28 ENCOUNTER — Ambulatory Visit (INDEPENDENT_AMBULATORY_CARE_PROVIDER_SITE_OTHER): Payer: Medicare Other | Admitting: Adult Health

## 2020-03-28 ENCOUNTER — Ambulatory Visit (INDEPENDENT_AMBULATORY_CARE_PROVIDER_SITE_OTHER): Payer: Medicare Other

## 2020-03-28 ENCOUNTER — Telehealth: Payer: Self-pay | Admitting: Nurse Practitioner

## 2020-03-28 ENCOUNTER — Other Ambulatory Visit: Payer: Self-pay

## 2020-03-28 ENCOUNTER — Encounter: Payer: Self-pay | Admitting: Adult Health

## 2020-03-28 DIAGNOSIS — R0600 Dyspnea, unspecified: Secondary | ICD-10-CM | POA: Diagnosis not present

## 2020-03-28 DIAGNOSIS — R0609 Other forms of dyspnea: Secondary | ICD-10-CM

## 2020-03-28 DIAGNOSIS — G4733 Obstructive sleep apnea (adult) (pediatric): Secondary | ICD-10-CM | POA: Diagnosis not present

## 2020-03-28 LAB — BRAIN NATRIURETIC PEPTIDE: Pro B Natriuretic peptide (BNP): 251 pg/mL — ABNORMAL HIGH (ref 0.0–100.0)

## 2020-03-28 MED ORDER — ALBUTEROL SULFATE HFA 108 (90 BASE) MCG/ACT IN AERS
1.0000 | INHALATION_SPRAY | Freq: Four times a day (QID) | RESPIRATORY_TRACT | 3 refills | Status: AC | PRN
Start: 2020-03-28 — End: ?

## 2020-03-28 NOTE — Patient Instructions (Addendum)
Chest xray and labs today  Restart Lasix 20mg  daily for 2 days then 1 daily .  Low salt diet  Albuterol Inhaler 1-2 puffs every 6hrs as needed.  Continue on CPAP At bedtime   Do not drive if sleepy .  Work on healthy weight loss.  Follow up with Dr. Halford Chessman  Or APP in 2-3 weeks with PFTs and As needed   Please contact office for sooner follow up if symptoms do not improve or worsen or seek emergency care

## 2020-03-28 NOTE — Progress Notes (Signed)
Reviewed and agree with assessment/plan.   Chesley Mires, MD [facility] Pulmonary/Critical Care 03/28/2020, 12:45 PM Pager:  [phone]

## 2020-03-28 NOTE — Assessment & Plan Note (Signed)
Dyspnea questionable etiology.  Patient has reportable asthma unclear if she has had pulmonary function testing.  She also has multiple comorbidities with A. fib.  She appears to have some component of volume overload with chronic lower extremity edema.  Also some mild anemia on recent lab work. For now will set up for pulmonary function testing.  Have her resume her Lasix with a small increase in dosing x1 day.  Will check labs with a BNP.  Have her follow-up with cardiology as well. Low suspicion for VTE as on Xarelto with endorsed compliance. Check chest x-ray.  Plan  Patient Instructions  Chest xray and labs today  Restart Lasix 20mg  daily for 2 days then 1 daily .  Low salt diet  Albuterol Inhaler 1-2 puffs every 6hrs as needed.  Continue on CPAP At bedtime   Do not drive if sleepy .  Work on healthy weight loss.  Follow up with Dr. Halford Chessman  Or APP in 2-3 weeks with PFTs and As needed   Please contact office for sooner follow up if symptoms do not improve or worsen or seek emergency care

## 2020-03-28 NOTE — Assessment & Plan Note (Signed)
Reports good CPAP compliance. Continue on current CPAP   Plan  Patient Instructions  Chest xray and labs today  Restart Lasix 20mg  daily for 2 days then 1 daily .  Low salt diet  Albuterol Inhaler 1-2 puffs every 6hrs as needed.  Continue on CPAP At bedtime   Do not drive if sleepy .  Work on healthy weight loss.  Follow up with Dr. Halford Chessman  Or APP in 2-3 weeks with PFTs and As needed   Please contact office for sooner follow up if symptoms do not improve or worsen or seek emergency care

## 2020-03-28 NOTE — Telephone Encounter (Signed)
Scheduled per 11/1 los. Pt is aware of appt times and dates.

## 2020-03-28 NOTE — Progress Notes (Signed)
_0  ID: [name], female    DOB: 02/17/1951, 69 y.o.   MRN: [id number]  Chief Complaint  Patient presents with  . Follow-up    Dsypnea     Referring provider: Stephens Shire, MD  HPI: 69 year old female followed for obstructive sleep apnea Medical history significant for A. fib on chronic anticoagulation, breast cancer (March 2013)  TEST/EVENTS :   PSG 06/09/97 >> AHI 37  ONO with CPAP 11/30/17 >>test time 6 hrs. Baseline SpO2 95%, low SpO2 83%. Spent 7 min with SpO2 <88%.  Auto CPAP 01/22/20 to 02/20/20 >> used on 29 of 30 nights with average 5 hrs 55 min.  Average AHI 1.8 with median CPAP 8 and 95 th percentile CPAP 11 cm H2O.    Echo 06/09/17 >> EF 65 to 70%, mild AR, mild MR, PAS 42 mmHg  03/28/2020 Acute OV : Dyspnea  Patient presents for AN acute office visit.  Patient is followed for obstructive sleep apnea on nocturnal CPAP.  Patient is compliant with her CPAP.  Denies any known issues with her CPAP. Patient was recently seen by oncology for routine follow-up for known history of breast cancer.  She is status post radiation and chemo and right mastectomy..  Original diagnosis in 2013. Patient complained of increased dyspnea and was notable to have wheezing on exam.  She was referred to our office for further evaluation. Complains over last 2 weeks of increased shortness of breath with activity, more leg swelling. No increased cough . Some wheezing. No increased congestion. No fever. No loss of taste or smell. No hx of Covid 19 infection. Covid vaccines x 3 utd. Flu shot utd.  Has missed a few doses of lasix.  O2 sats walking today with brief drop to 88% then rebound >90%.   Has history of A Fib , endorses compliance with Xarelto.  She denies any hemoptysis, pleuritic pain, chest pain.  Says she has Asthma that began in her 30s, Uses albuterol on occasion.   At baseline she is sedentary . Has chronic knee issues with arthritic pains, recently finished PT .  Not as active, can walk short distances only . Has some balance issues .    Allergies  Allergen Reactions  . Trandolapril-Verapamil Hcl Er Swelling    Causes lips to swell [name]" is the name brand)  . Trandolapril-Verapamil Hcl Er     Immunization History  Administered Date(s) Administered  . Fluad Quad(high Dose 65+) 02/22/2020  . Influenza Split 03/23/2011, 03/15/2012, 03/26/2013, 03/12/2017  . Influenza Whole 04/25/2009  . Influenza, High Dose Seasonal PF 03/17/2016  . Influenza,inj,Quad PF,6+ Mos 02/19/2018  . Influenza-Unspecified 03/26/2015  . PFIZER SARS-COV-2 Vaccination 07/19/2019, 08/09/2019, 02/09/2020  . Pneumococcal Polysaccharide-23 04/25/2009    Past Medical History:  Diagnosis Date  . Arthritis   . Asthma   . Blood transfusion    hx of last one in 1987  . Breast cancer [facility]) 2013   Right Breast Cancer  . Cancer of lower-inner quadrant of female breast ([place]) 07/24/2011   right breast cancer /IDC,stage IIB,er/pr=+,her2=Neg  . Glaucoma   . Heart murmur   . History of chemotherapy    taxotere/cytoxan 6 cycles 09/22/11-01/05/12 day 2 neulasta  . Hyperlipemia   . Hypertension   . Lymphedema    right arm wears a sleeve  . MRSA (methicillin resistant staph aureus) culture positive    08/2011  . Neuromuscular disorder ([place])    peripheral neuropathy hands feet  . OSA (obstructive sleep  apnea)    CPAP settings- ?   . Persistent atrial fibrillation ([place])   . Pituitary abnormality ([place]) 05-25-13   small pituitary growth-appears stable- Dr. Christella Noa follows    Tobacco History: Social History   Tobacco Use  Smoking Status Never Smoker  Smokeless Tobacco Never Used   Counseling given: Not Answered   Outpatient Medications Prior to Visit  Medication Sig Dispense Refill  . acetaminophen (TYLENOL) 325 MG tablet Take 325-650 mg by mouth every 6 (six) hours as needed (for pain.).    Marland Kitchen amLODipine (NORVASC) 2.5 MG tablet Take 1 tablet (2.5 mg total) by mouth  daily. Please make overdue appt with Dr. Irish Lack before anymore refills. 1st attempt 30 tablet 0  . cabergoline (DOSTINEX) 0.5 MG tablet Take 0.5 mg by mouth 2 (two) times a week. Tuesday and Thursday    . Cholecalciferol (VITAMIN D3) 1000 UNITS CAPS Take 1,000 Units by mouth every other day.     . furosemide (LASIX) 20 MG tablet TAKE 1 TABLET DAILY AS NEEDED FOR FLUID 90 tablet 3  . GLUCOSAMINE HCL PO Take 1 tablet by mouth daily.    . hydroxypropyl methylcellulose / hypromellose (ISOPTO TEARS / GONIOVISC) 2.5 % ophthalmic solution Place 1 drop into both eyes 3 (three) times daily as needed for dry eyes.    Marland Kitchen losartan-hydrochlorothiazide (HYZAAR) 100-25 MG tablet Take 1 tablet by mouth daily. 90 tablet 3  . metoprolol succinate (TOPROL-XL) 50 MG 24 hr tablet TAKE ONE AND ONE-HALF TABLETS TWICE A DAY 270 tablet 3  . Multiple Vitamin (MULITIVITAMIN WITH MINERALS) TABS Take 1 tablet by mouth daily with breakfast.     . potassium chloride SA (KLOR-CON) 20 MEQ tablet Take 2 tablets by mouth daily. Take when taking lasix(Furosemide). Please make overdue appt with Dr. Irish Lack before anymore refills. 1st attempt 60 tablet 0  . rivaroxaban (XARELTO) 20 MG TABS tablet TAKE 1 TABLET DAILY WITH SUPPER 90 tablet 1  . SIMBRINZA 1-0.2 % SUSP Place 1 drop into both eyes 3 (three) times daily.     . simvastatin (ZOCOR) 20 MG tablet TAKE 1 TABLET AT BEDTIME 90 tablet 3  . TRAVATAN Z 0.004 % SOLN ophthalmic solution Place 1 drop into both eyes every evening.     . vitamin B-12 (CYANOCOBALAMIN) 500 MCG tablet Take 500 mcg by mouth daily.    Marland Kitchen albuterol (PROAIR HFA) 108 (90 Base) MCG/ACT inhaler Inhale 2 puffs into the lungs every 6 (six) hours as needed for wheezing or shortness of breath. For shortness of breath     Facility-Administered Medications Prior to Visit  Medication Dose Route Frequency Provider Last Rate Last Admin  . lidocaine (cardiac) 100 mg/89m (XYLOCAINE) 20 MG/ML injection 2%    Anesthesia  Intra-op JMontel Clock CRNA   60 mg at 06/02/17 1343     Review of Systems:   Constitutional:   No  weight loss, night sweats,  Fevers, chills,  +fatigue, or  lassitude.  HEENT:   No headaches,  Difficulty swallowing,  Tooth/dental problems, or  Sore throat,                No sneezing, itching, ear ache, nasal congestion, post nasal drip,   CV:  No chest pain,  Orthopnea, PND, ++swelling in lower extremities, anasarca, dizziness, palpitations, syncope.   GI  No heartburn, indigestion, abdominal pain, nausea, vomiting, diarrhea, change in bowel habits, loss of appetite, bloody stools.   Resp:   No chest wall deformity  Skin: no  rash or lesions.  GU: no dysuria, change in color of urine, no urgency or frequency.  No flank pain, no hematuria   MS:  No joint pain or swelling.  No decreased range of motion.  No back pain.    Physical Exam  BP 120/70 (BP Location: Left Arm, Patient Position: Sitting, Cuff Size: Normal)   Pulse 74   Temp (!) 97.2 F (36.2 C) (Temporal)   Ht _0  (1.575 m)   Wt 288 lb (130.6 kg)   SpO2 100%   BMI 52.68 kg/m   GEN: A/Ox3; pleasant , NAD, BMI 52   HEENT:  [place]/AT,   NOSE-clear, THROAT-clear, no lesions, no postnasal drip or exudate noted.   NECK:  Supple w/ fair ROM; no JVD; normal carotid impulses w/o bruits; no thyromegaly or nodules palpated; no lymphadenopathy.    RESP  Clear  P & A; w/o, wheezes/ rales/ or rhonchi. no accessory muscle use, no dullness to percussion  CARD:  RRR, no m/r/g, 2-3 +  peripheral edema, pulses intact, no cyanosis or clubbing.  GI:   Soft & nt; nml bowel sounds; no organomegaly or masses detected.   Musco: Warm bil, no deformities or joint swelling noted.   Neuro: alert, no focal deficits noted.    Skin: Warm, no lesions or rashes    Lab Results:  CBC  BMET  BNP No results found for: BNP  ProBNP No results found for: PROBNP  Imaging: No results found.    No flowsheet data found.  No  results found for: NITRICOXIDE      Assessment & Plan:   Obstructive sleep apnea Reports good CPAP compliance. Continue on current CPAP   Plan  Patient Instructions  Chest xray and labs today  Restart Lasix 88m daily for 2 days then 1 daily .  Low salt diet  Albuterol Inhaler 1-2 puffs every 6hrs as needed.  Continue on CPAP At bedtime   Do not drive if sleepy .  Work on healthy weight loss.  Follow up with Dr. SHalford Chessman Or APP in 2-3 weeks with PFTs and As needed   Please contact office for sooner follow up if symptoms do not improve or worsen or seek emergency care        Dyspnea Dyspnea questionable etiology.  Patient has reportable asthma unclear if she has had pulmonary function testing.  She also has multiple comorbidities with A. fib.  She appears to have some component of volume overload with chronic lower extremity edema.  Also some mild anemia on recent lab work. For now will set up for pulmonary function testing.  Have her resume her Lasix with a small increase in dosing x1 day.  Will check labs with a BNP.  Have her follow-up with cardiology as well. Low suspicion for VTE as on Xarelto with endorsed compliance. Check chest x-ray.  Plan  Patient Instructions  Chest xray and labs today  Restart Lasix 213mdaily for 2 days then 1 daily .  Low salt diet  Albuterol Inhaler 1-2 puffs every 6hrs as needed.  Continue on CPAP At bedtime   Do not drive if sleepy .  Work on healthy weight loss.  Follow up with Dr. SoHalford ChessmanOr APP in 2-3 weeks with PFTs and As needed   Please contact office for sooner follow up if symptoms do not improve or worsen or seek emergency care            TaRexene EdisonNP 03/28/2020

## 2020-04-02 ENCOUNTER — Ambulatory Visit (HOSPITAL_COMMUNITY)
Admission: RE | Admit: 2020-04-02 | Discharge: 2020-04-02 | Disposition: A | Discharge status: Home / Self Care | Payer: Medicare Other | Source: Ambulatory Visit | Attending: Nurse Practitioner | Admitting: Nurse Practitioner

## 2020-04-02 ENCOUNTER — Inpatient Hospital Stay: Payer: Medicare Other

## 2020-04-02 ENCOUNTER — Other Ambulatory Visit: Payer: Self-pay

## 2020-04-02 DIAGNOSIS — D539 Nutritional anemia, unspecified: Secondary | ICD-10-CM

## 2020-04-02 DIAGNOSIS — D649 Anemia, unspecified: Secondary | ICD-10-CM

## 2020-04-02 DIAGNOSIS — Z170 Estrogen receptor positive status [ER+]: Secondary | ICD-10-CM | POA: Insufficient documentation

## 2020-04-02 DIAGNOSIS — C50311 Malignant neoplasm of lower-inner quadrant of right female breast: Secondary | ICD-10-CM | POA: Insufficient documentation

## 2020-04-02 DIAGNOSIS — C50911 Malignant neoplasm of unspecified site of right female breast: Secondary | ICD-10-CM | POA: Diagnosis not present

## 2020-04-02 LAB — RETIC PANEL
Immature Retic Fract: 13.8 % (ref 2.3–15.9)
RBC.: 3.92 MIL/uL (ref 3.87–5.11)
Retic Count, Absolute: 71.3 10*3/uL (ref 19.0–186.0)
Retic Ct Pct: 1.8 % (ref 0.4–3.1)
Reticulocyte Hemoglobin: 34.8 pg (ref >27.9)

## 2020-04-02 LAB — COMPREHENSIVE METABOLIC PANEL
ALT: 11 U/L (ref 0–44)
AST: 15 U/L (ref 15–41)
Albumin: 4 g/dL (ref 3.5–5.0)
Alkaline Phosphatase: 60 U/L (ref 38–126)
Anion gap: 9 (ref 5–15)
BUN: 16 mg/dL (ref 8–23)
CO2: 29 mmol/L (ref 22–32)
Calcium: 9.3 mg/dL (ref 8.9–10.3)
Chloride: 104 mmol/L (ref 98–111)
Creatinine, Ser: 1.28 mg/dL — ABNORMAL HIGH (ref 0.44–1.00)
GFR, Estimated: 45 mL/min — ABNORMAL LOW (ref >60)
Glucose, Bld: 114 mg/dL — ABNORMAL HIGH (ref 70–99)
Potassium: 3.4 mmol/L — ABNORMAL LOW (ref 3.5–5.1)
Sodium: 142 mmol/L (ref 135–145)
Total Bilirubin: 2.1 mg/dL — ABNORMAL HIGH (ref 0.3–1.2)
Total Protein: 6.9 g/dL (ref 6.5–8.1)

## 2020-04-02 LAB — CBC WITH DIFFERENTIAL/PLATELET
Abs Immature Granulocytes: 0.01 10*3/uL (ref 0.00–0.07)
Basophils Absolute: 0 10*3/uL (ref 0.0–0.1)
Basophils Relative: 1 %
Eosinophils Absolute: 0.2 10*3/uL (ref 0.0–0.5)
Eosinophils Relative: 4 %
HCT: 37.5 % (ref 36.0–46.0)
Hemoglobin: 11.8 g/dL — ABNORMAL LOW (ref 12.0–15.0)
Immature Granulocytes: 0 %
Lymphocytes Relative: 34 %
Lymphs Abs: 1.5 10*3/uL (ref 0.7–4.0)
MCH: 29.9 pg (ref 26.0–34.0)
MCHC: 31.5 g/dL (ref 30.0–36.0)
MCV: 95.2 fL (ref 80.0–100.0)
Monocytes Absolute: 0.5 10*3/uL (ref 0.1–1.0)
Monocytes Relative: 10 %
Neutro Abs: 2.3 10*3/uL (ref 1.7–7.7)
Neutrophils Relative %: 51 %
Platelets: 167 10*3/uL (ref 150–400)
RBC: 3.94 MIL/uL (ref 3.87–5.11)
RDW: 13.5 % (ref 11.5–15.5)
WBC: 4.5 10*3/uL (ref 4.0–10.5)
nRBC: 0 % (ref 0.0–0.2)

## 2020-04-02 LAB — VITAMIN B12: Vitamin B-12: 1137 pg/mL — ABNORMAL HIGH (ref 180–914)

## 2020-04-02 LAB — IRON AND TIBC
Iron: 69 ug/dL (ref 41–142)
Saturation Ratios: 25 % (ref 21–57)
TIBC: 280 ug/dL (ref 236–444)
UIBC: 211 ug/dL (ref 120–384)

## 2020-04-02 LAB — LACTATE DEHYDROGENASE: LDH: 236 U/L — ABNORMAL HIGH (ref 98–192)

## 2020-04-02 LAB — FOLATE: Folate: 9.7 ng/mL (ref >5.9)

## 2020-04-02 LAB — FERRITIN: Ferritin: 338 ng/mL — ABNORMAL HIGH (ref 11–307)

## 2020-04-03 LAB — HAPTOGLOBIN: Haptoglobin: 163 mg/dL (ref 42–346)

## 2020-04-06 LAB — METHYLMALONIC ACID, SERUM: Methylmalonic Acid, Quantitative: 291 nmol/L (ref 0–378)

## 2020-04-11 ENCOUNTER — Telehealth: Payer: Self-pay | Admitting: Hematology

## 2020-04-11 ENCOUNTER — Telehealth: Payer: Self-pay

## 2020-04-11 NOTE — Telephone Encounter (Signed)
Scheduled appt per 11/17 sch msg - mailed appt with appt date and time

## 2020-04-11 NOTE — Telephone Encounter (Signed)
Spoke with pt gave all lab/ [name] results from 04/10/20 pt agree to lab f/u approx 09/2020 and f/u in 10/22 scheduling message sent

## 2020-04-24 ENCOUNTER — Ambulatory Visit: Payer: Medicare Other | Admitting: Adult Health

## 2020-06-01 ENCOUNTER — Ambulatory Visit: Payer: Medicare Other | Admitting: Adult Health

## 2020-06-27 ENCOUNTER — Ambulatory Visit: Payer: Medicare Other | Admitting: Adult Health

## 2020-08-31 ENCOUNTER — Other Ambulatory Visit: Payer: Self-pay | Admitting: Interventional Cardiology

## 2020-08-31 DIAGNOSIS — I482 Chronic atrial fibrillation, unspecified: Secondary | ICD-10-CM

## 2020-08-31 NOTE — Telephone Encounter (Signed)
Prescription refill request for Xarelto received.  Indication: a fib Last office visit:  01/12/20 Weight: 288 Age: 70 Scr: 1.28 CrCl: 83 mL/min

## 2020-09-04 ENCOUNTER — Other Ambulatory Visit: Payer: Self-pay

## 2020-09-04 DIAGNOSIS — I482 Chronic atrial fibrillation, unspecified: Secondary | ICD-10-CM

## 2020-09-04 MED ORDER — RIVAROXABAN 20 MG PO TABS: 20.0000 mg | ORAL_TABLET | Freq: Every day | ORAL | 1 refills | Status: DC

## 2020-09-04 NOTE — Telephone Encounter (Signed)
Age 70, weight 130kg, SCr 0.94 on 08/07/20, CrCl > 100. afib indication, last visit Aug 2021 [facility], [facility] now to [facility], rx sent in

## 2020-10-05 ENCOUNTER — Telehealth: Payer: Self-pay | Admitting: Hematology

## 2020-10-05 NOTE — Telephone Encounter (Signed)
R/s appt per 5/13 sch msg. Pt aware.

## 2020-10-09 ENCOUNTER — Other Ambulatory Visit: Payer: Medicare Other

## 2020-11-09 ENCOUNTER — Other Ambulatory Visit: Payer: Medicare Other

## 2020-11-13 ENCOUNTER — Other Ambulatory Visit: Payer: Self-pay

## 2020-11-13 ENCOUNTER — Ambulatory Visit
Admission: RE | Admit: 2020-11-13 | Discharge: 2020-11-13 | Disposition: A | Discharge status: Home / Self Care | Payer: Medicare Other | Source: Ambulatory Visit | Attending: Nurse Practitioner | Admitting: Nurse Practitioner

## 2020-11-13 DIAGNOSIS — Z1231 Encounter for screening mammogram for malignant neoplasm of breast: Secondary | ICD-10-CM

## 2020-11-15 ENCOUNTER — Inpatient Hospital Stay: Payer: Medicare Other | Attending: Hematology

## 2020-11-15 ENCOUNTER — Other Ambulatory Visit: Payer: Self-pay

## 2020-11-15 DIAGNOSIS — Z923 Personal history of irradiation: Secondary | ICD-10-CM | POA: Insufficient documentation

## 2020-11-15 DIAGNOSIS — Z7901 Long term (current) use of anticoagulants: Secondary | ICD-10-CM | POA: Diagnosis not present

## 2020-11-15 DIAGNOSIS — Z9221 Personal history of antineoplastic chemotherapy: Secondary | ICD-10-CM | POA: Insufficient documentation

## 2020-11-15 DIAGNOSIS — C50311 Malignant neoplasm of lower-inner quadrant of right female breast: Secondary | ICD-10-CM

## 2020-11-15 DIAGNOSIS — Z170 Estrogen receptor positive status [ER+]: Secondary | ICD-10-CM

## 2020-11-15 DIAGNOSIS — M8580 Other specified disorders of bone density and structure, unspecified site: Secondary | ICD-10-CM | POA: Insufficient documentation

## 2020-11-15 DIAGNOSIS — N189 Chronic kidney disease, unspecified: Secondary | ICD-10-CM | POA: Insufficient documentation

## 2020-11-15 DIAGNOSIS — J45909 Unspecified asthma, uncomplicated: Secondary | ICD-10-CM | POA: Insufficient documentation

## 2020-11-15 DIAGNOSIS — Z79899 Other long term (current) drug therapy: Secondary | ICD-10-CM | POA: Diagnosis not present

## 2020-11-15 DIAGNOSIS — E669 Obesity, unspecified: Secondary | ICD-10-CM | POA: Insufficient documentation

## 2020-11-15 DIAGNOSIS — I4891 Unspecified atrial fibrillation: Secondary | ICD-10-CM | POA: Insufficient documentation

## 2020-11-15 DIAGNOSIS — Z853 Personal history of malignant neoplasm of breast: Secondary | ICD-10-CM | POA: Insufficient documentation

## 2020-11-15 DIAGNOSIS — D649 Anemia, unspecified: Secondary | ICD-10-CM

## 2020-11-15 LAB — COMPREHENSIVE METABOLIC PANEL
ALT: 9 U/L (ref 0–44)
AST: 13 U/L — ABNORMAL LOW (ref 15–41)
Albumin: 3.6 g/dL (ref 3.5–5.0)
Alkaline Phosphatase: 73 U/L (ref 38–126)
Anion gap: 10 (ref 5–15)
BUN: 18 mg/dL (ref 8–23)
CO2: 27 mmol/L (ref 22–32)
Calcium: 8.9 mg/dL (ref 8.9–10.3)
Chloride: 104 mmol/L (ref 98–111)
Creatinine, Ser: 1.03 mg/dL — ABNORMAL HIGH (ref 0.44–1.00)
GFR, Estimated: 58 mL/min — ABNORMAL LOW (ref >60)
Glucose, Bld: 107 mg/dL — ABNORMAL HIGH (ref 70–99)
Potassium: 3.6 mmol/L (ref 3.5–5.1)
Sodium: 141 mmol/L (ref 135–145)
Total Bilirubin: 2.8 mg/dL — ABNORMAL HIGH (ref 0.3–1.2)
Total Protein: 6.5 g/dL (ref 6.5–8.1)

## 2020-11-15 LAB — CBC WITH DIFFERENTIAL/PLATELET
Abs Immature Granulocytes: 0.01 10*3/uL (ref 0.00–0.07)
Basophils Absolute: 0 10*3/uL (ref 0.0–0.1)
Basophils Relative: 1 %
Eosinophils Absolute: 0.1 10*3/uL (ref 0.0–0.5)
Eosinophils Relative: 2 %
HCT: 36.6 % (ref 36.0–46.0)
Hemoglobin: 11.9 g/dL — ABNORMAL LOW (ref 12.0–15.0)
Immature Granulocytes: 0 %
Lymphocytes Relative: 37 %
Lymphs Abs: 1.8 10*3/uL (ref 0.7–4.0)
MCH: 30.7 pg (ref 26.0–34.0)
MCHC: 32.5 g/dL (ref 30.0–36.0)
MCV: 94.3 fL (ref 80.0–100.0)
Monocytes Absolute: 0.5 10*3/uL (ref 0.1–1.0)
Monocytes Relative: 10 %
Neutro Abs: 2.5 10*3/uL (ref 1.7–7.7)
Neutrophils Relative %: 50 %
Platelets: 146 10*3/uL — ABNORMAL LOW (ref 150–400)
RBC: 3.88 MIL/uL (ref 3.87–5.11)
RDW: 14.6 % (ref 11.5–15.5)
WBC: 4.9 10*3/uL (ref 4.0–10.5)
nRBC: 0 % (ref 0.0–0.2)

## 2020-11-16 LAB — FOLATE RBC
Folate, Hemolysate: 457 ng/mL
Folate, RBC: 1266 ng/mL (ref >498)
Hematocrit: 36.1 % (ref 34.0–46.6)

## 2020-11-21 ENCOUNTER — Telehealth: Payer: Self-pay

## 2020-11-21 NOTE — Telephone Encounter (Signed)
Pt made aware of lab results and verbalized understanding.

## 2020-11-21 NOTE — Telephone Encounter (Signed)
-----   Message from Alla Feeling, NP sent at 11/20/2020  1:29 PM EDT ----- Please let her know labs are stable. Very mild anemia and thrombocytopenia fluctuate, were normal 07/2020, and very slightly low now. Folic acid is normal. Continue monitoring. Next lab and f/up in 02/2021.  Thanks, Regan Rakers, NP

## 2020-11-28 ENCOUNTER — Telehealth: Payer: Self-pay

## 2020-11-28 NOTE — Telephone Encounter (Signed)
Pt made aware of lab results and verbalized understanding.

## 2020-11-28 NOTE — Telephone Encounter (Signed)
-----   Message from Truitt Merle, MD sent at 11/24/2020 12:15 PM EDT ----- Please let pt know her lab result, mild anemia,slightly elevated Cr and Tbil, overall stable, no new concerns. Folate level was normal. Thanks   Truitt Merle  11/24/2020

## 2021-01-15 ENCOUNTER — Other Ambulatory Visit: Payer: Self-pay | Admitting: Interventional Cardiology

## 2021-03-04 ENCOUNTER — Other Ambulatory Visit: Payer: Self-pay

## 2021-03-04 ENCOUNTER — Ambulatory Visit (INDEPENDENT_AMBULATORY_CARE_PROVIDER_SITE_OTHER): Payer: Medicare Other | Admitting: Pulmonary Disease

## 2021-03-04 ENCOUNTER — Encounter: Payer: Self-pay | Admitting: Pulmonary Disease

## 2021-03-04 VITALS — BP 130/70 | HR 89 | Temp 98.4°F | Ht 62.0 in | Wt 286.0 lb

## 2021-03-04 DIAGNOSIS — Z7189 Other specified counseling: Secondary | ICD-10-CM

## 2021-03-04 DIAGNOSIS — E669 Obesity, unspecified: Secondary | ICD-10-CM

## 2021-03-04 DIAGNOSIS — Z23 Encounter for immunization: Secondary | ICD-10-CM

## 2021-03-04 DIAGNOSIS — Z9989 Dependence on other enabling machines and devices: Secondary | ICD-10-CM | POA: Diagnosis not present

## 2021-03-04 DIAGNOSIS — R0609 Other forms of dyspnea: Secondary | ICD-10-CM

## 2021-03-04 DIAGNOSIS — G4730 Sleep apnea, unspecified: Secondary | ICD-10-CM | POA: Diagnosis not present

## 2021-03-04 DIAGNOSIS — G4733 Obstructive sleep apnea (adult) (pediatric): Secondary | ICD-10-CM

## 2021-03-04 NOTE — Progress Notes (Signed)
[facility]  Chief Complaint  Patient presents with   Follow-up    OSA on Cpap    Past Surgical History:  She  has a past surgical history that includes Tubal ligation; Dilation and curettage of uterus; Carpal tunnel release (Right); Cholecystectomy (1987); Abdominal hysterectomy (2007); Breast surgery; Portacath placement (09/11/2011); Port-a-cath removal (06/22/2012); Eye surgery; port-a-cath insertion; Colonoscopy (N/A, 06/13/2013); Cardioversion (N/A, 06/02/2017); and Mastectomy modified radical (Right, 08/14/11).  Past Medical History:  Pituitary adenoma, Peripheral neuropathy, Lymphedema Rt arm, HTN, HLD, Glaucoma, Rt breast cancer 2013, Asthma, OA, Persistent A fib  Constitutional:  BP 130/70 (BP Location: Left Arm, Patient Position: Sitting, Cuff Size: Normal)   Pulse 89   Temp 98.4 F (36.9 C) (Oral)   Ht 5\' 2"  (1.575 m)   Wt 286 lb (129.7 kg)   SpO2 100%   BMI 52.31 kg/m   Brief Summary:  [name] is a 70 y.o. female with obstructive sleep apnea.      Subjective:   She saw [name] in November 2021 for dyspnea.  Chest xray showed changed of heart failure.  It doesn't look like she was able to complete PFT.  She had lasix dose increased and this helped.  Breathing is better now.  She has cardiology follow up in January.  She uses CPAP nightly.  Goes to bed at 9 pm, wakes up once to use the bathroom, and then gets out of bed at 7 am.  She naps for about 1 hour in the afternoon.  She doesn't always use CPAP when napping.    She is asking about Bivalent COVID vaccine.  Physical Exam:   Appearance - well kempt, sitting in wheelchair  ENMT - no sinus tenderness, no oral exudate, no LAN, Mallampati 4 airway, no stridor  Respiratory - equal breath sounds bilaterally, no wheezing or rales  CV - s1s2 regular rate and rhythm, 2/6 SM  Ext - ankle edema  Skin - no rashes  Psych - normal mood and affect   Sleep  Tests:  PSG 06/09/97 >> AHI 37 ONO with CPAP 11/30/17 >> test time 6 hrs.  Baseline SpO2 95%, low SpO2 83%.  Spent 7 min with SpO2 < 88%. Auto CPAP 01/22/20 to 02/20/20 >> used on 29 of 30 nights with average 5 hrs 55 min.  Average AHI 1.8 with median CPAP 8 and 95 th percentile CPAP 11 cm H2O.  Cardiac Tests:  Echo 06/09/17 >> EF 65 to 70%, mild AR, mild MR, PAS 42 mmHg  Social History:  She  reports that she has never smoked. She has never used smokeless tobacco. She reports that she does not drink alcohol and does not use drugs.  Family History:  Her family history includes Arthritis in her mother; Cancer (age of onset: 8) in her father.     Assessment/Plan:   Obstructive sleep apnea. - she is compliant with CPAP and reports benefit from therapy - she uses [employer] for her DME - wil get copy of her download and call her with results - continue auto CPAP 5 to 15 cm H2O - high dose flu shot today   Dyspnea. - likely combination of obesity with deconditioning, and diastolic heart failure - explained how heart failure symptoms can sometimes mimic asthma symptoms (cardiac asthma) - encouraged her to maintain a regular exercise regimen  Persistent atrial fibrillation, chronic diastolic CHF, valvular heart disease. - followed by Dr. Irish Lack with cardiology   COVID  19 advice. - explained she is in high risk category for severe complications if she contracted COVID - advised her to get Bivalent COVID 19 vaccine  Time Spent Involved in Patient Care on Day of Examination:  34 minutes  Follow up:   Patient Instructions  Will ask [employer] to send a copy of your CPAP report and call you with results  High dose flu shot today  Follow up in 1 year  Medication List:   Allergies as of 03/04/2021       Reactions   Trandolapril-verapamil Hcl Er Swelling   Causes lips to swell [name]" is the name brand)   Trandolapril-verapamil Hcl Er         Medication List        Accurate as  of March 04, 2021  9:43 AM. If you have any questions, ask your nurse or doctor.          acetaminophen 325 MG tablet Commonly known as: TYLENOL Take 325-650 mg by mouth every 6 (six) hours as needed (for pain.).   albuterol 108 (90 Base) MCG/ACT inhaler Commonly known as: ProAir HFA Inhale 1-2 puffs into the lungs every 6 (six) hours as needed for wheezing or shortness of breath. For shortness of breath   amLODipine 2.5 MG tablet Commonly known as: NORVASC Take 1 tablet (2.5 mg total) by mouth daily. Please make overdue appt with Dr. Irish Lack before anymore refills. 1st attempt   cabergoline 0.5 MG tablet Commonly known as: DOSTINEX Take 0.5 mg by mouth 2 (two) times a week. Tuesday and Thursday   furosemide 20 MG tablet Commonly known as: LASIX TAKE 1 TABLET DAILY AS NEEDED FOR FLUID   GLUCOSAMINE HCL PO Take 1 tablet by mouth daily.   hydroxypropyl methylcellulose / hypromellose 2.5 % ophthalmic solution Commonly known as: ISOPTO TEARS / GONIOVISC Place 1 drop into both eyes 3 (three) times daily as needed for dry eyes.   losartan-hydrochlorothiazide 100-25 MG tablet Commonly known as: HYZAAR Take 1 tablet by mouth daily.   metoprolol succinate 50 MG 24 hr tablet Commonly known as: TOPROL-XL TAKE ONE AND ONE-HALF TABLETS TWICE A DAY   multivitamin with minerals Tabs tablet Take 1 tablet by mouth daily with breakfast.   potassium chloride SA 20 MEQ tablet Commonly known as: KLOR-CON Take 2 tablets by mouth daily. Take when taking lasix(Furosemide). Please make overdue appt with Dr. Irish Lack before anymore refills. 1st attempt   rivaroxaban 20 MG Tabs tablet Commonly known as: Xarelto Take 1 tablet (20 mg total) by mouth daily with supper.   Simbrinza 1-0.2 % Susp Generic drug: Brinzolamide-Brimonidine Place 1 drop into both eyes 3 (three) times daily.   simvastatin 20 MG tablet Commonly known as: ZOCOR TAKE 1 TABLET AT BEDTIME   Travatan Z 0.004 % Soln  ophthalmic solution Generic drug: Travoprost (BAK Free) Place 1 drop into both eyes every evening.   vitamin B-12 500 MCG tablet Commonly known as: CYANOCOBALAMIN Take 500 mcg by mouth daily.   Vitamin D3 25 MCG (1000 UT) Caps Take 1,000 Units by mouth every other day.        Signature:  Chesley Mires, MD [place] Pager - [phone] 03/04/2021, 9:43 AM

## 2021-03-04 NOTE — Patient Instructions (Signed)
Will ask [employer] to send a copy of your CPAP report and call you with results  High dose flu shot today  Follow up in 1 year

## 2021-03-25 ENCOUNTER — Inpatient Hospital Stay: Payer: Medicare Other

## 2021-03-25 ENCOUNTER — Encounter: Payer: Self-pay | Admitting: Hematology

## 2021-03-25 ENCOUNTER — Inpatient Hospital Stay: Payer: Medicare Other | Attending: Hematology | Admitting: Hematology

## 2021-03-25 ENCOUNTER — Other Ambulatory Visit: Payer: Self-pay

## 2021-03-25 VITALS — BP 149/93 | HR 83 | Temp 97.2°F | Resp 18 | Wt 283.3 lb

## 2021-03-25 DIAGNOSIS — I4891 Unspecified atrial fibrillation: Secondary | ICD-10-CM | POA: Diagnosis not present

## 2021-03-25 DIAGNOSIS — Z1231 Encounter for screening mammogram for malignant neoplasm of breast: Secondary | ICD-10-CM

## 2021-03-25 DIAGNOSIS — C50311 Malignant neoplasm of lower-inner quadrant of right female breast: Secondary | ICD-10-CM

## 2021-03-25 DIAGNOSIS — Z853 Personal history of malignant neoplasm of breast: Secondary | ICD-10-CM | POA: Diagnosis not present

## 2021-03-25 DIAGNOSIS — Z923 Personal history of irradiation: Secondary | ICD-10-CM | POA: Insufficient documentation

## 2021-03-25 DIAGNOSIS — Z7901 Long term (current) use of anticoagulants: Secondary | ICD-10-CM | POA: Insufficient documentation

## 2021-03-25 DIAGNOSIS — Z9221 Personal history of antineoplastic chemotherapy: Secondary | ICD-10-CM | POA: Insufficient documentation

## 2021-03-25 DIAGNOSIS — N189 Chronic kidney disease, unspecified: Secondary | ICD-10-CM | POA: Insufficient documentation

## 2021-03-25 DIAGNOSIS — Z170 Estrogen receptor positive status [ER+]: Secondary | ICD-10-CM

## 2021-03-25 LAB — CBC WITH DIFFERENTIAL/PLATELET
Abs Immature Granulocytes: 0.02 10*3/uL (ref 0.00–0.07)
Basophils Absolute: 0 10*3/uL (ref 0.0–0.1)
Basophils Relative: 0 %
Eosinophils Absolute: 0.2 10*3/uL (ref 0.0–0.5)
Eosinophils Relative: 3 %
HCT: 36.8 % (ref 36.0–46.0)
Hemoglobin: 11.6 g/dL — ABNORMAL LOW (ref 12.0–15.0)
Immature Granulocytes: 0 %
Lymphocytes Relative: 33 %
Lymphs Abs: 1.5 10*3/uL (ref 0.7–4.0)
MCH: 29.7 pg (ref 26.0–34.0)
MCHC: 31.5 g/dL (ref 30.0–36.0)
MCV: 94.1 fL (ref 80.0–100.0)
Monocytes Absolute: 0.5 10*3/uL (ref 0.1–1.0)
Monocytes Relative: 11 %
Neutro Abs: 2.3 10*3/uL (ref 1.7–7.7)
Neutrophils Relative %: 53 %
Platelets: 158 10*3/uL (ref 150–400)
RBC: 3.91 MIL/uL (ref 3.87–5.11)
RDW: 14.7 % (ref 11.5–15.5)
WBC: 4.5 10*3/uL (ref 4.0–10.5)
nRBC: 0 % (ref 0.0–0.2)

## 2021-03-25 LAB — COMPREHENSIVE METABOLIC PANEL
ALT: 8 U/L (ref 0–44)
AST: 14 U/L — ABNORMAL LOW (ref 15–41)
Albumin: 3.6 g/dL (ref 3.5–5.0)
Alkaline Phosphatase: 66 U/L (ref 38–126)
Anion gap: 8 (ref 5–15)
BUN: 15 mg/dL (ref 8–23)
CO2: 29 mmol/L (ref 22–32)
Calcium: 8.8 mg/dL — ABNORMAL LOW (ref 8.9–10.3)
Chloride: 106 mmol/L (ref 98–111)
Creatinine, Ser: 1.15 mg/dL — ABNORMAL HIGH (ref 0.44–1.00)
GFR, Estimated: 51 mL/min — ABNORMAL LOW (ref >60)
Glucose, Bld: 111 mg/dL — ABNORMAL HIGH (ref 70–99)
Potassium: 3.3 mmol/L — ABNORMAL LOW (ref 3.5–5.1)
Sodium: 143 mmol/L (ref 135–145)
Total Bilirubin: 2.4 mg/dL — ABNORMAL HIGH (ref 0.3–1.2)
Total Protein: 6.5 g/dL (ref 6.5–8.1)

## 2021-03-25 NOTE — Progress Notes (Signed)
[place]   [phone] [phone]   Clinic Follow up Note   Patient Care Team: Stephens Shire, MD as PCP - General (Family Medicine) Thompson Grayer, MD as PCP - Electrophysiology (Cardiology) Jettie Booze, MD as PCP - Cardiology (Cardiology)  Date of Service:  03/25/2021  CHIEF COMPLAINT: f/u of right breast cancer  CURRENT THERAPY:  Surveillance  ASSESSMENT & PLAN:  [name] is a 70 y.o. female with   1. History of right breast cancer, stage iib, ER+/HER2- -diagnosed 08/05/2011 [id number] stage IIB invasive ductal carcinoma  -s/p right simple mastectomy with ALND 08/14/2011 final path revealed invasive and in situ ductal carcinoma, margins negative, focal LVI and 1/15 + LNs, ER/PR + HER2 negative final pT2N1aMx stage IIB -s/p adjuvant chemo with taxotere/cytoxan q3 weeks x6 09/22/11 - 01/05/12 -s/p adjuvant radiation completed 03/2012 -s/p adjuvant anastrozole x7 years completed 03/2019 -most recent left MM on 11/13/20 was negative -she is clinically doing well. Labs reviewed, overall stable. Her physical exam was unremarkable. -I offered to release her to her PCP, but she would prefer to continue to see [name] every year. I will schedule her to see NP Lacie next year.   2. Bone health -DEXA in 2018, normal  -continue calcium and vit D -I encouraged her to repeat with her PCP   3. OSA, asthma, Afib, CKD -continue f/u with pulmonology, cardiology, PCP    4. Mild hyperbilirulinemia  -started around 2013, overall stable  -US liver negative in 2021 -I encouraged her to f/u with PCP or her GI Dr. Collene Mares for work up   Plan -lab and f/u with NP Lacie in 1 year.   No problem-specific Assessment & Plan notes found for this encounter.   SUMMARY OF ONCOLOGIC HISTORY: #1 The patient was originally seen at the multidisciplinary breast clinic on 08/05/2011 for a stage IIB, invasive ductal carcinoma (T3 NX MX).    #2  Status post right  simple mastectomy with right axillary lymph node resection on 08/14/2011 with the final pathology revealing invasive and in situ ductal carcinoma. Two nodules were seen 3 cm and 2.2 cm.  Margins were negative.  There was focal lymphovascular space involvement by tumor. 1/15 sentinel lymph nodes were positive for metastatic disease.  The tumor was estrogen receptor positive 100%,  progesterone receptor positive 100%,  and HER-2/neu negative-no amplification by CISH with a ratio of 1.09, proliferation marker 53%. Final pathologic staging was stage IIB, pT2 pN1a, pMX.     #3.  S/P Adjuvant chemotherapy with Taxotere/Cytoxan  Q3 weeks beginning on 09/22/2011.  The patient is a participant in NSBP B-49 protocol.  She completed total of 6 cycles of Taxotere/Cytoxan given from 09/22/2011 -  01/05/2012.   #4   Status post radiation therapy to the right chest wall and axilla completed on 03/31/2012.   #5  She began adjuvant antiestrogen therapy with Arimidex 1 mg by mouth daily in 03/2012.  She completed 7 years in 03/28/19   #6. Bone Density Scan: in 02/23/17 showed normal density with T-score of -0.9 at Right Femur neck.    INTERVAL HISTORY:  [name] is here for a follow up of breast cancer. She was last seen by NP Lacie one year ago. She presents to the clinic alone. She reports she is having problems with her left knee and is being followed by orthopedics. She also has a bit of a cough today. Recall she is followed by Dr. Halford Chessman in pulmonology.  All other systems were reviewed with the patient and are negative.  MEDICAL HISTORY:  Past Medical History:  Diagnosis Date   Arthritis    Asthma    Blood transfusion    hx of last one in 1987   Breast cancer [facility]) 2013   Right Breast Cancer   Cancer of lower-inner quadrant of female breast ([place]) 07/24/2011   right breast cancer /IDC,stage IIB,er/pr=+,her2=Neg   Glaucoma    Heart murmur    History of chemotherapy    taxotere/cytoxan 6 cycles  09/22/11-01/05/12 day 2 neulasta   Hyperlipemia    Hypertension    Lymphedema    right arm wears a sleeve   MRSA (methicillin resistant staph aureus) culture positive    08/2011   Neuromuscular disorder ([place])    peripheral neuropathy hands feet   OSA (obstructive sleep apnea)    CPAP settings- ?    Persistent atrial fibrillation (HCC)    Pituitary abnormality ([place]) 05-25-13   small pituitary growth-appears stable- Dr. Christella Noa follows    SURGICAL HISTORY: Past Surgical History:  Procedure Laterality Date   ABDOMINAL HYSTERECTOMY  2007   TAH/BSO   BREAST SURGERY     mastectomy RIGHT   CARDIOVERSION N/A 06/02/2017   Procedure: CARDIOVERSION;  Surgeon: Dorothy Spark, MD;  Location: [place];  Service: Cardiovascular;  Laterality: N/A;   CARPAL TUNNEL RELEASE Right    2012   CHOLECYSTECTOMY  1987   open - Dr Lindon Romp   COLONOSCOPY N/A 06/13/2013   Procedure: COLONOSCOPY;  Surgeon: Juanita Craver, MD;  [facility];  Service: Endoscopy;  Laterality: N/A;   DILATION AND CURETTAGE OF UTERUS     x3   EYE SURGERY     laser eye surgery for glaucoma   MASTECTOMY MODIFIED RADICAL Right 08/14/11   right , ER/PR +, HER2 -   port-a-cath insertion     port-a-cath removal 1'14   PORT-A-CATH REMOVAL  06/22/2012   Procedure: MINOR REMOVAL PORT-A-CATH;  Surgeon: Haywood Lasso, MD;  Location: [place];  Service: General;  Laterality: Left;   PORTACATH PLACEMENT  09/11/2011   Procedure: INSERTION PORT-A-CATH;  Surgeon: Joyice Faster. Cornett, MD;  Location[facility];  Service: General;  Laterality: N/A;  Insert of Port   TUBAL LIGATION      I have reviewed the social history and family history with the patient and they are unchanged from previous note.  ALLERGIES:  is allergic to trandolapril-verapamil hcl er and trandolapril-verapamil hcl er.  MEDICATIONS:  Current Outpatient Medications  Medication Sig Dispense Refill   acetaminophen (TYLENOL) 325 MG tablet Take  325-650 mg by mouth every 6 (six) hours as needed (for pain.).     albuterol (PROAIR HFA) 108 (90 Base) MCG/ACT inhaler Inhale 1-2 puffs into the lungs every 6 (six) hours as needed for wheezing or shortness of breath. For shortness of breath 18 g 3   amLODipine (NORVASC) 2.5 MG tablet Take 1 tablet (2.5 mg total) by mouth daily. Please make overdue appt with Dr. Irish Lack before anymore refills. 1st attempt 30 tablet 0   cabergoline (DOSTINEX) 0.5 MG tablet Take 0.5 mg by mouth 2 (two) times a week. Tuesday and Thursday     Cholecalciferol (VITAMIN D3) 1000 UNITS CAPS Take 1,000 Units by mouth every other day.      furosemide (LASIX) 20 MG tablet TAKE 1 TABLET DAILY AS NEEDED FOR FLUID 90 tablet 3   GLUCOSAMINE HCL PO Take 1 tablet by mouth daily.  hydroxypropyl methylcellulose / hypromellose (ISOPTO TEARS / GONIOVISC) 2.5 % ophthalmic solution Place 1 drop into both eyes 3 (three) times daily as needed for dry eyes.     losartan-hydrochlorothiazide (HYZAAR) 100-25 MG tablet Take 1 tablet by mouth daily. 90 tablet 3   metoprolol succinate (TOPROL-XL) 50 MG 24 hr tablet TAKE ONE AND ONE-HALF TABLETS TWICE A DAY 270 tablet 3   Multiple Vitamin (MULITIVITAMIN WITH MINERALS) TABS Take 1 tablet by mouth daily with breakfast.      potassium chloride SA (KLOR-CON) 20 MEQ tablet Take 2 tablets by mouth daily. Take when taking lasix(Furosemide). Please make overdue appt with Dr. Irish Lack before anymore refills. 1st attempt 60 tablet 0   rivaroxaban (XARELTO) 20 MG TABS tablet Take 1 tablet (20 mg total) by mouth daily with supper. 90 tablet 1   SIMBRINZA 1-0.2 % SUSP Place 1 drop into both eyes 3 (three) times daily.      simvastatin (ZOCOR) 20 MG tablet TAKE 1 TABLET AT BEDTIME 90 tablet 3   TRAVATAN Z 0.004 % SOLN ophthalmic solution Place 1 drop into both eyes every evening.      vitamin B-12 (CYANOCOBALAMIN) 500 MCG tablet Take 500 mcg by mouth daily.     No current facility-administered medications  for this visit.   Facility-Administered Medications Ordered in Other Visits  Medication Dose Route Frequency Provider Last Rate Last Admin   lidocaine (cardiac) 100 mg/63m (XYLOCAINE) 20 MG/ML injection 2%    Anesthesia Intra-op JMontel Clock CRNA   60 mg at 06/02/17 1343    PHYSICAL EXAMINATION: ECOG PERFORMANCE STATUS: 3 - Symptomatic, >50% confined to bed  Vitals:   03/25/21 0834  BP: (!) 149/93  Pulse: 83  Resp: 18  Temp: (!) 97.2 F (36.2 C)  SpO2: 98%   Wt Readings from Last 3 Encounters:  03/25/21 283 lb 5 oz (128.5 kg)  03/04/21 286 lb (129.7 kg)  03/28/20 288 lb (130.6 kg)     GENERAL:alert, no distress and comfortable SKIN: skin color, texture, turgor are normal, no rashes or significant lesions EYES: normal, Conjunctiva are pink and non-injected, sclera clear  NECK: supple, thyroid normal size, non-tender, without nodularity LYMPH:  no palpable lymphadenopathy in the cervical, axillary  LUNGS: clear to auscultation and percussion with normal breathing effort HEART: regular rate & rhythm and no murmurs and no lower extremity edema Musculoskeletal:no cyanosis of digits and no clubbing  NEURO: alert & oriented x 3 with fluent speech, no focal motor/sensory deficits BREAST: soft scar tissue present to left chest wall. No palpable mass, nodules or adenopathy bilaterally. Breast exam benign.   LABORATORY DATA:  I have reviewed the data as listed CBC Latest Ref Rng & Units 03/25/2021 11/15/2020 11/15/2020  WBC 4.0 - 10.5 K/uL 4.5 4.9 -  Hemoglobin 12.0 - 15.0 g/dL 11.6(L) 11.9(L) -  Hematocrit 36.0 - 46.0 % 36.8 36.6 36.1  Platelets 150 - 400 K/uL 158 146(L) -     CMP Latest Ref Rng & Units 03/25/2021 11/15/2020 04/02/2020  Glucose 70 - 99 mg/dL 111(H) 107(H) 114(H)  BUN 8 - 23 mg/dL 15 18 16   Creatinine 0.44 - 1.00 mg/dL 1.15(H) 1.03(H) 1.28(H)  Sodium 135 - 145 mmol/L 143 141 142  Potassium 3.5 - 5.1 mmol/L 3.3(L) 3.6 3.4(L)  Chloride 98 - 111 mmol/L 106 104  104  CO2 22 - 32 mmol/L 29 27 29   Calcium 8.9 - 10.3 mg/dL 8.8(L) 8.9 9.3  Total Protein 6.5 - 8.1 g/dL 6.5 6.5 6.9  Total Bilirubin 0.3 - 1.2 mg/dL 2.4(H) 2.8(H) 2.1(H)  Alkaline Phos 38 - 126 U/L 66 73 60  AST 15 - 41 U/L 14(L) 13(L) 15  ALT 0 - 44 U/L 8 9 11       RADIOGRAPHIC STUDIES: I have personally reviewed the radiological images as listed and agreed with the findings in the report. No results found.    Orders Placed This Encounter  Procedures   MM Digital Screening Unilat L    Standing Status:   Future    Standing Expiration Date:   03/25/2022    Order Specific Question:   Reason for Exam (SYMPTOM  OR DIAGNOSIS REQUIRED)    Answer:   screening    Order Specific Question:   Preferred imaging location?    Answer:   [facility]   All questions were answered. The patient knows to call the clinic with any problems, questions or concerns. No barriers to learning was detected. The total time spent in the appointment was 25 minutes.     Truitt Merle, MD 03/25/2021   I, Wilburn Mylar, am acting as scribe for Truitt Merle, MD.   I have reviewed the above documentation for accuracy and completeness, and I agree with the above.

## 2021-04-15 ENCOUNTER — Telehealth: Payer: Self-pay | Admitting: Interventional Cardiology

## 2021-04-15 ENCOUNTER — Other Ambulatory Visit: Payer: Self-pay | Admitting: Interventional Cardiology

## 2021-04-15 DIAGNOSIS — I482 Chronic atrial fibrillation, unspecified: Secondary | ICD-10-CM

## 2021-04-15 MED ORDER — RIVAROXABAN 20 MG PO TABS: ORAL_TABLET | ORAL | 1 refills | Status: DC

## 2021-04-15 NOTE — Telephone Encounter (Signed)
Xarelto 20 mg refill request received. Pt is 70 years old, weight- 128.5 kg, Crea-1.15 on 03/25/21, last seen by Dr. Irish Lack on 12/1919, pt has 1 yr f/u sched for 06/12/21,  Diagnosis-afib, CrCl- 89.7; Dose is appropriate based on dosing criteria. Will send in refill to requested pharmacy.

## 2021-04-15 NOTE — Telephone Encounter (Signed)
*  STAT* If patient is at the pharmacy, call can be transferred to refill team.   1. Which medications need to be refilled? (please list name of each medication and dose if known)  rivaroxaban (XARELTO) 20 MG TABS tablet  2. Which pharmacy/location (including street and city if local pharmacy) is medication to be sent to? [id number] - [place], [place] - [address]  3. Do they need a 30 day or 90 day supply? 10 day supply   Patient has 2 tablets left. She requests a 10 day supply because [facility] will not get to her in time.

## 2021-06-11 NOTE — Progress Notes (Signed)
Cardiology Office Note   Date:  06/12/2021   ID:  [name], DOB 04-Aug-1950, MRN [id number]  PCP:  Stephens Shire, MD    Chief Complaint  Patient presents with   Follow-up   Atrial fibrillation  Wt Readings from Last 3 Encounters:  06/12/21 274 lb (124.3 kg)  03/25/21 283 lb 5 oz (128.5 kg)  03/04/21 286 lb (129.7 kg)       History of Present Illness: [name] is a 71 y.o. female   Who was seen in the AFib clinic.  Per Roderic Palau, she has "a h/o of breast CA s/p R mastectomy not currently undergoing chemo or radiation, HLD, HTN, and a small benign pituitary mass as of 2018.   In December 2018, she had some dizziness while eating dinner.  Heart rhythm was irregular at the time and she was diagnosed with atrial fibrillation.  She was started on low-dose beta-blocker and Xarelto at the time.  She had a successful cardioversion in January 2019.  She was in sinus rhythm 11 days after the cardioversion at her follow-up appointment.   I then saw her in April 2019 for her to establish with general cardiology.  At that time, she was back in atrial fibrillation.  We increased her Toprol-XL dose to 50 mg daily.  She continues with Xarelto.   Stress test many years ago per her report was normal.   She was also followed in the atrial fibrillation clinic.  In May 2020, she was seen in the office and at that time, was asymptomatic while on Toprol-XL 75 mg twice daily.   She gained weight with less exercise due to COVID.  SHe stopped going to the gym.  Overall feeling well.  She has noticed more leg edema of late.  Denies : Chest pain. Dizziness. Nitroglycerin use. Orthopnea. Palpitations. Paroxysmal nocturnal dyspnea. Syncope.      Past Medical History:  Diagnosis Date   Arthritis    Asthma    Blood transfusion    hx of last one in 1987   Breast cancer [facility]) 2013   Right Breast Cancer   Cancer of lower-inner quadrant of female breast ([place]) 07/24/2011   right  breast cancer /IDC,stage IIB,er/pr=+,her2=Neg   Glaucoma    Heart murmur    History of chemotherapy    taxotere/cytoxan 6 cycles 09/22/11-01/05/12 day 2 neulasta   Hyperlipemia    Hypertension    Lymphedema    right arm wears a sleeve   MRSA (methicillin resistant staph aureus) culture positive    08/2011   Neuromuscular disorder ([place])    peripheral neuropathy hands feet   OSA (obstructive sleep apnea)    CPAP settings- ?    Persistent atrial fibrillation (HCC)    Pituitary abnormality ([place]) 05-25-13   small pituitary growth-appears stable- Dr. Christella Noa follows    Past Surgical History:  Procedure Laterality Date   ABDOMINAL HYSTERECTOMY  2007   TAH/BSO   BREAST SURGERY     mastectomy RIGHT   CARDIOVERSION N/A 06/02/2017   Procedure: CARDIOVERSION;  Surgeon: Dorothy Spark, MD;  Location: [place];  Service: Cardiovascular;  Laterality: N/A;   CARPAL TUNNEL RELEASE Right    2012   CHOLECYSTECTOMY  1987   open - Dr Lindon Romp   COLONOSCOPY N/A 06/13/2013   Procedure: COLONOSCOPY;  Surgeon: Juanita Craver, MD;  [facility];  Service: Endoscopy;  Laterality: N/A;   DILATION AND CURETTAGE OF UTERUS  x3   EYE SURGERY     laser eye surgery for glaucoma   MASTECTOMY MODIFIED RADICAL Right 08/14/11   right , ER/PR +, HER2 -   port-a-cath insertion     port-a-cath removal 1'14   PORT-A-CATH REMOVAL  06/22/2012   Procedure: MINOR REMOVAL PORT-A-CATH;  Surgeon: Haywood Lasso, MD;  Location: [place];  Service: General;  Laterality: Left;   PORTACATH PLACEMENT  09/11/2011   Procedure: INSERTION PORT-A-CATH;  Surgeon: Joyice Faster. Cornett, MD;  [facility];  Service: General;  Laterality: N/A;  Insert of Port   TUBAL LIGATION       Current Outpatient Medications  Medication Sig Dispense Refill   acetaminophen (TYLENOL) 325 MG tablet Take 325-650 mg by mouth every 6 (six) hours as needed (for pain.).     albuterol (PROAIR HFA) 108 (90 Base) MCG/ACT  inhaler Inhale 1-2 puffs into the lungs every 6 (six) hours as needed for wheezing or shortness of breath. For shortness of breath 18 g 3   amLODipine (NORVASC) 2.5 MG tablet Take 1 tablet (2.5 mg total) by mouth daily. Please make overdue appt with Dr. Irish Lack before anymore refills. 1st attempt 30 tablet 0   cabergoline (DOSTINEX) 0.5 MG tablet Take 0.5 mg by mouth 2 (two) times a week. Tuesday and Thursday     Cholecalciferol (VITAMIN D3) 1000 UNITS CAPS Take 1,000 Units by mouth every other day.      furosemide (LASIX) 20 MG tablet TAKE 1 TABLET DAILY AS NEEDED FOR FLUID 90 tablet 3   GLUCOSAMINE HCL PO Take 1 tablet by mouth daily.     hydroxypropyl methylcellulose / hypromellose (ISOPTO TEARS / GONIOVISC) 2.5 % ophthalmic solution Place 1 drop into both eyes 3 (three) times daily as needed for dry eyes.     losartan-hydrochlorothiazide (HYZAAR) 100-25 MG tablet Take 1 tablet by mouth daily. 90 tablet 3   metoprolol succinate (TOPROL-XL) 50 MG 24 hr tablet TAKE ONE AND ONE-HALF TABLETS TWICE A DAY 270 tablet 3   Multiple Vitamin (MULITIVITAMIN WITH MINERALS) TABS Take 1 tablet by mouth daily with breakfast.      potassium chloride SA (KLOR-CON) 20 MEQ tablet Take 2 tablets by mouth daily. Take when taking lasix(Furosemide). Please make overdue appt with Dr. Irish Lack before anymore refills. 1st attempt 60 tablet 0   rivaroxaban (XARELTO) 20 MG TABS tablet TAKE 1 TABLET DAILY WITH SUPPER 10 tablet 1   SIMBRINZA 1-0.2 % SUSP Place 1 drop into both eyes 3 (three) times daily.      simvastatin (ZOCOR) 20 MG tablet TAKE 1 TABLET AT BEDTIME 90 tablet 3   vitamin B-12 (CYANOCOBALAMIN) 500 MCG tablet Take 500 mcg by mouth daily.     No current facility-administered medications for this visit.   Facility-Administered Medications Ordered in Other Visits  Medication Dose Route Frequency Provider Last Rate Last Admin   lidocaine (cardiac) 100 mg/63m (XYLOCAINE) 20 MG/ML injection 2%    Anesthesia  Intra-op JMontel Clock CRNA   60 mg at 06/02/17 1343    Allergies:   Trandolapril-verapamil hcl er and Trandolapril-verapamil hcl er    Social History:  The patient  reports that she has never smoked. She has never used smokeless tobacco. She reports that she does not drink alcohol and does not use drugs.   Family History:  The patient's family history includes Arthritis in her mother; Cancer (age of onset: 760 in her father.    ROS:  Please see the  history of present illness.   Otherwise, review of systems are positive for weight loss.   All other systems are reviewed and negative.    PHYSICAL EXAM: VS:  BP 140/80    Pulse 75    Ht 5' 2" (1.575 m)    Wt 274 lb (124.3 kg)    SpO2 98%    BMI 50.12 kg/m  , BMI Body mass index is 50.12 kg/m. GEN: Well nourished, well developed, in no acute distress HEENT: normal Neck: no JVD, carotid bruits, or masses Cardiac: Irregularly irregular; no murmurs, rubs, or gallops, 2+ LE bilateral edema  Respiratory:  clear to auscultation bilaterally, normal work of breathing GI: soft, nontender, nondistended, + BS MS: no deformity or atrophy Skin: warm and dry, no rash Neuro:  Strength and sensation are intact Psych: euthymic mood, full affect   EKG:   The ekg ordered today demonstrates atrial fibrillation, rate controlled    Recent Labs: 03/25/2021: ALT 8; BUN 15; Creatinine, Ser 1.15; Hemoglobin 11.6; Platelets 158; Potassium 3.3; Sodium 143   Lipid Panel    Component Value Date/Time   CHOL 182 10/15/2018 1047   TRIG 66 10/15/2018 1047   HDL 64 10/15/2018 1047   CHOLHDL 2.8 10/15/2018 1047   LDLCALC 105 (H) 10/15/2018 1047     Other studies Reviewed: Additional studies/ records that were reviewed today with results demonstrating: Potassium 3.3 in October 2022-now on potassium, LDL 105 in May 2020, creatinine 1.1 in October 2022.   ASSESSMENT AND PLAN:  Persistent AFib: Rate controlled. Xarelto for stroke prevention.  HTN:  Low-salt diet.  Avoid processed foods.  Given the marked edema she is having, will change Hyzaar to losartan 100 mg daily.  Increase furosemide from 20 mg to 40 mg daily.  Continue potassium.  Check be met in 1 to 2 weeks and Pharm.D. hypertension visit.  If blood pressure is high, would increase amlodipine to 5 mg daily. OSA: Tolerating CPAP well. Obesity: Increase exercise to target below.  Limit portion sizes.  Avoid concentrated sweets.  Whole food, plant-based diet recommended. Anticoagulated: Acquired thrombophilia from atrial fibrillation.   Current medicines are reviewed at length with the patient today.  The patient concerns regarding her medicines were addressed.  The following changes have been made: Increase furosemide to 40 mg daily.  Change Hyzaar to losartan 100 mg daily  Labs/ tests ordered today include: BMet No orders of the defined types were placed in this encounter.   Recommend 150 minutes/week of aerobic exercise Low fat, low carb, high fiber diet recommended  Disposition:   FU in 2 weeks PharmD HTN clinic   Signed, Larae Grooms, MD  06/12/2021 11:50 AM    [facility] [place], [place],   [address] Phone: [phone]; Fax: [phone]

## 2021-06-12 ENCOUNTER — Encounter: Payer: Self-pay | Admitting: Interventional Cardiology

## 2021-06-12 ENCOUNTER — Other Ambulatory Visit: Payer: Self-pay

## 2021-06-12 ENCOUNTER — Ambulatory Visit (INDEPENDENT_AMBULATORY_CARE_PROVIDER_SITE_OTHER): Payer: Medicare Other | Admitting: Interventional Cardiology

## 2021-06-12 VITALS — BP 140/80 | HR 75 | Ht 62.0 in | Wt 274.0 lb

## 2021-06-12 DIAGNOSIS — G4733 Obstructive sleep apnea (adult) (pediatric): Secondary | ICD-10-CM | POA: Diagnosis not present

## 2021-06-12 DIAGNOSIS — I482 Chronic atrial fibrillation, unspecified: Secondary | ICD-10-CM | POA: Diagnosis not present

## 2021-06-12 DIAGNOSIS — D6869 Other thrombophilia: Secondary | ICD-10-CM

## 2021-06-12 DIAGNOSIS — I10 Essential (primary) hypertension: Secondary | ICD-10-CM

## 2021-06-12 DIAGNOSIS — Z7901 Long term (current) use of anticoagulants: Secondary | ICD-10-CM

## 2021-06-12 MED ORDER — FUROSEMIDE 40 MG PO TABS: 40.0000 mg | ORAL_TABLET | Freq: Every day | ORAL | 6 refills | Status: DC

## 2021-06-12 MED ORDER — LOSARTAN POTASSIUM 100 MG PO TABS: 100.0000 mg | ORAL_TABLET | Freq: Every day | ORAL | 6 refills | Status: DC

## 2021-06-12 NOTE — Patient Instructions (Signed)
Medication Instructions:  .Your physician has recommended you make the following change in your medication: Increase furosemide to 40 mg by mouth daily. Stop losartan/HCTZ.   Start losartan 100 mg by mouth daily.  *If you need a refill on your cardiac medications before your next appointment, please call your pharmacy*   Lab Work: Your physician recommends that you return for lab work in: 1-2 weeks. On day of appointment with pharmacist  BMP  -   If you have labs (blood work) drawn today and your tests are completely normal, you will receive your results only by: [place] (if you have MyChart) OR A paper copy in the mail If you have any lab test that is abnormal or we need to change your treatment, we will call you to review the results.   Testing/Procedures: none   Follow-Up: At [facility], you and your health needs are our priority.  As part of our continuing mission to provide you with exceptional heart care, we have created designated Provider Care Teams.  These Care Teams include your primary Cardiologist (physician) and Advanced Practice Providers (APPs -  Physician Assistants and Nurse Practitioners) who all work together to provide you with the care you need, when you need it.  We recommend signing up for the patient portal called "MyChart".  Sign up information is provided on this After Visit Summary.  MyChart is used to connect with patients for Virtual Visits (Telemedicine).  Patients are able to view lab/test results, encounter notes, upcoming appointments, etc.  Non-urgent messages can be sent to your provider as well.   To learn more about what you can do with MyChart, go to [email or web address].    Your next appointment:   12 month(s)  The format for your next appointment:   In Person  Provider:   Larae Grooms, MD y    Other Instructions You have been referred to hypertension clinic in our office.  Please schedule new patient appointment in 1-2  weeks.   Marland Kitcheninst

## 2021-06-26 ENCOUNTER — Other Ambulatory Visit: Payer: Self-pay | Admitting: Interventional Cardiology

## 2021-06-26 DIAGNOSIS — I482 Chronic atrial fibrillation, unspecified: Secondary | ICD-10-CM

## 2021-06-26 NOTE — Telephone Encounter (Signed)
Prescription refill request for Xarelto received.  Indication: Afib  Last office visit: 06/12/21 Irish Lack)  Weight: 124.3kg Age: 71 Scr: 1.15 (03/25/21)  CrCl: 86.66ml/min  Appropriate dose and refill sent to requested pharmacy.

## 2021-07-05 ENCOUNTER — Other Ambulatory Visit: Payer: Medicare Other

## 2021-07-05 ENCOUNTER — Ambulatory Visit: Payer: Medicare Other

## 2021-08-05 ENCOUNTER — Ambulatory Visit: Payer: Medicare Other

## 2021-08-05 ENCOUNTER — Other Ambulatory Visit: Payer: Self-pay

## 2021-08-05 ENCOUNTER — Other Ambulatory Visit: Payer: Medicare Other

## 2021-08-05 ENCOUNTER — Inpatient Hospital Stay (HOSPITAL_COMMUNITY)
Admission: EM | Admit: 2021-08-05 | Discharge: 2021-08-10 | DRG: 291 | Disposition: A | Discharge status: Home Health | Payer: Medicare Other | Attending: Internal Medicine | Admitting: Internal Medicine

## 2021-08-05 ENCOUNTER — Emergency Department (HOSPITAL_COMMUNITY): Payer: Medicare Other

## 2021-08-05 ENCOUNTER — Encounter (HOSPITAL_COMMUNITY): Payer: Self-pay | Admitting: Emergency Medicine

## 2021-08-05 DIAGNOSIS — I2729 Other secondary pulmonary hypertension: Secondary | ICD-10-CM | POA: Diagnosis present

## 2021-08-05 DIAGNOSIS — Z853 Personal history of malignant neoplasm of breast: Secondary | ICD-10-CM

## 2021-08-05 DIAGNOSIS — Z7901 Long term (current) use of anticoagulants: Secondary | ICD-10-CM

## 2021-08-05 DIAGNOSIS — Z170 Estrogen receptor positive status [ER+]: Secondary | ICD-10-CM | POA: Diagnosis not present

## 2021-08-05 DIAGNOSIS — I4819 Other persistent atrial fibrillation: Secondary | ICD-10-CM | POA: Diagnosis present

## 2021-08-05 DIAGNOSIS — J45909 Unspecified asthma, uncomplicated: Secondary | ICD-10-CM | POA: Diagnosis present

## 2021-08-05 DIAGNOSIS — I5041 Acute combined systolic (congestive) and diastolic (congestive) heart failure: Secondary | ICD-10-CM | POA: Diagnosis present

## 2021-08-05 DIAGNOSIS — I130 Hypertensive heart and chronic kidney disease with heart failure and stage 1 through stage 4 chronic kidney disease, or unspecified chronic kidney disease: Secondary | ICD-10-CM | POA: Diagnosis present

## 2021-08-05 DIAGNOSIS — I10 Essential (primary) hypertension: Secondary | ICD-10-CM | POA: Diagnosis present

## 2021-08-05 DIAGNOSIS — E66813 Obesity, class 3: Secondary | ICD-10-CM | POA: Diagnosis present

## 2021-08-05 DIAGNOSIS — M1990 Unspecified osteoarthritis, unspecified site: Secondary | ICD-10-CM | POA: Diagnosis present

## 2021-08-05 DIAGNOSIS — J9601 Acute respiratory failure with hypoxia: Secondary | ICD-10-CM

## 2021-08-05 DIAGNOSIS — I2781 Cor pulmonale (chronic): Secondary | ICD-10-CM | POA: Diagnosis present

## 2021-08-05 DIAGNOSIS — Z20822 Contact with and (suspected) exposure to covid-19: Secondary | ICD-10-CM | POA: Diagnosis present

## 2021-08-05 DIAGNOSIS — R112 Nausea with vomiting, unspecified: Secondary | ICD-10-CM

## 2021-08-05 DIAGNOSIS — R197 Diarrhea, unspecified: Secondary | ICD-10-CM | POA: Diagnosis not present

## 2021-08-05 DIAGNOSIS — Z923 Personal history of irradiation: Secondary | ICD-10-CM

## 2021-08-05 DIAGNOSIS — I482 Chronic atrial fibrillation, unspecified: Secondary | ICD-10-CM | POA: Diagnosis present

## 2021-08-05 DIAGNOSIS — G629 Polyneuropathy, unspecified: Secondary | ICD-10-CM | POA: Diagnosis present

## 2021-08-05 DIAGNOSIS — N179 Acute kidney failure, unspecified: Secondary | ICD-10-CM | POA: Diagnosis not present

## 2021-08-05 DIAGNOSIS — K761 Chronic passive congestion of liver: Secondary | ICD-10-CM | POA: Diagnosis present

## 2021-08-05 DIAGNOSIS — D352 Benign neoplasm of pituitary gland: Secondary | ICD-10-CM | POA: Diagnosis present

## 2021-08-05 DIAGNOSIS — Z888 Allergy status to other drugs, medicaments and biological substances status: Secondary | ICD-10-CM

## 2021-08-05 DIAGNOSIS — H409 Unspecified glaucoma: Secondary | ICD-10-CM | POA: Diagnosis present

## 2021-08-05 DIAGNOSIS — I081 Rheumatic disorders of both mitral and tricuspid valves: Secondary | ICD-10-CM | POA: Diagnosis present

## 2021-08-05 DIAGNOSIS — C50311 Malignant neoplasm of lower-inner quadrant of right female breast: Secondary | ICD-10-CM | POA: Diagnosis present

## 2021-08-05 DIAGNOSIS — I509 Heart failure, unspecified: Secondary | ICD-10-CM | POA: Diagnosis not present

## 2021-08-05 DIAGNOSIS — E662 Morbid (severe) obesity with alveolar hypoventilation: Secondary | ICD-10-CM | POA: Diagnosis present

## 2021-08-05 DIAGNOSIS — Z9221 Personal history of antineoplastic chemotherapy: Secondary | ICD-10-CM

## 2021-08-05 DIAGNOSIS — L304 Erythema intertrigo: Secondary | ICD-10-CM | POA: Diagnosis not present

## 2021-08-05 DIAGNOSIS — Z79899 Other long term (current) drug therapy: Secondary | ICD-10-CM

## 2021-08-05 DIAGNOSIS — G4733 Obstructive sleep apnea (adult) (pediatric): Secondary | ICD-10-CM | POA: Diagnosis not present

## 2021-08-05 DIAGNOSIS — E876 Hypokalemia: Secondary | ICD-10-CM | POA: Diagnosis present

## 2021-08-05 DIAGNOSIS — R0902 Hypoxemia: Secondary | ICD-10-CM

## 2021-08-05 DIAGNOSIS — R0602 Shortness of breath: Secondary | ICD-10-CM | POA: Diagnosis present

## 2021-08-05 DIAGNOSIS — Z6841 Body Mass Index (BMI) 40.0 and over, adult: Secondary | ICD-10-CM | POA: Diagnosis not present

## 2021-08-05 DIAGNOSIS — E785 Hyperlipidemia, unspecified: Secondary | ICD-10-CM | POA: Diagnosis present

## 2021-08-05 DIAGNOSIS — Z9011 Acquired absence of right breast and nipple: Secondary | ICD-10-CM

## 2021-08-05 DIAGNOSIS — N1831 Chronic kidney disease, stage 3a: Secondary | ICD-10-CM | POA: Diagnosis present

## 2021-08-05 DIAGNOSIS — I5021 Acute systolic (congestive) heart failure: Secondary | ICD-10-CM | POA: Diagnosis not present

## 2021-08-05 DIAGNOSIS — Z8261 Family history of arthritis: Secondary | ICD-10-CM

## 2021-08-05 LAB — CBC WITH DIFFERENTIAL/PLATELET
Abs Immature Granulocytes: 0.05 10*3/uL (ref 0.00–0.07)
Basophils Absolute: 0 10*3/uL (ref 0.0–0.1)
Basophils Relative: 1 %
Eosinophils Absolute: 0 10*3/uL (ref 0.0–0.5)
Eosinophils Relative: 0 %
HCT: 41.3 % (ref 36.0–46.0)
Hemoglobin: 12.7 g/dL (ref 12.0–15.0)
Immature Granulocytes: 1 %
Lymphocytes Relative: 18 %
Lymphs Abs: 1.2 10*3/uL (ref 0.7–4.0)
MCH: 30.6 pg (ref 26.0–34.0)
MCHC: 30.8 g/dL (ref 30.0–36.0)
MCV: 99.5 fL (ref 80.0–100.0)
Monocytes Absolute: 0.6 10*3/uL (ref 0.1–1.0)
Monocytes Relative: 10 %
Neutro Abs: 4.4 10*3/uL (ref 1.7–7.7)
Neutrophils Relative %: 70 %
Platelets: 152 10*3/uL (ref 150–400)
RBC: 4.15 MIL/uL (ref 3.87–5.11)
RDW: 14.7 % (ref 11.5–15.5)
WBC: 6.3 10*3/uL (ref 4.0–10.5)
nRBC: 0 % (ref 0.0–0.2)

## 2021-08-05 LAB — RESP PANEL BY RT-PCR (FLU A&B, COVID) ARPGX2
Influenza A by PCR: NEGATIVE
Influenza B by PCR: NEGATIVE
SARS Coronavirus 2 by RT PCR: NEGATIVE

## 2021-08-05 LAB — BRAIN NATRIURETIC PEPTIDE: B Natriuretic Peptide: 630.1 pg/mL — ABNORMAL HIGH (ref 0.0–100.0)

## 2021-08-05 LAB — TROPONIN I (HIGH SENSITIVITY)
Troponin I (High Sensitivity): 17 ng/L (ref <18)
Troponin I (High Sensitivity): 22 ng/L — ABNORMAL HIGH (ref <18)

## 2021-08-05 LAB — COMPREHENSIVE METABOLIC PANEL
ALT: 13 U/L (ref 0–44)
AST: 19 U/L (ref 15–41)
Albumin: 4 g/dL (ref 3.5–5.0)
Alkaline Phosphatase: 88 U/L (ref 38–126)
Anion gap: 12 (ref 5–15)
BUN: 10 mg/dL (ref 8–23)
CO2: 26 mmol/L (ref 22–32)
Calcium: 9.3 mg/dL (ref 8.9–10.3)
Chloride: 105 mmol/L (ref 98–111)
Creatinine, Ser: 1.13 mg/dL — ABNORMAL HIGH (ref 0.44–1.00)
GFR, Estimated: 52 mL/min — ABNORMAL LOW (ref >60)
Glucose, Bld: 105 mg/dL — ABNORMAL HIGH (ref 70–99)
Potassium: 3.5 mmol/L (ref 3.5–5.1)
Sodium: 143 mmol/L (ref 135–145)
Total Bilirubin: 5.6 mg/dL — ABNORMAL HIGH (ref 0.3–1.2)
Total Protein: 7.1 g/dL (ref 6.5–8.1)

## 2021-08-05 MED ORDER — LOSARTAN POTASSIUM 50 MG PO TABS
100.0000 mg | ORAL_TABLET | Freq: Every day | ORAL | Status: DC
  Administered 2021-08-06: 100 mg via ORAL
  Filled 2021-08-05 (×2): qty 2

## 2021-08-05 MED ORDER — FUROSEMIDE 10 MG/ML IJ SOLN
40.0000 mg | Freq: Once | INTRAMUSCULAR | Status: AC
  Administered 2021-08-05: 40 mg via INTRAVENOUS
  Filled 2021-08-05: qty 4

## 2021-08-05 MED ORDER — ALBUTEROL SULFATE (2.5 MG/3ML) 0.083% IN NEBU: 3.0000 mL | INHALATION_SOLUTION | Freq: Four times a day (QID) | RESPIRATORY_TRACT | Status: DC | PRN

## 2021-08-05 MED ORDER — METOPROLOL SUCCINATE ER 50 MG PO TB24
75.0000 mg | ORAL_TABLET | Freq: Two times a day (BID) | ORAL | Status: DC
  Administered 2021-08-06 – 2021-08-07 (×5): 75 mg via ORAL
  Filled 2021-08-05 (×2): qty 2
  Filled 2021-08-05: qty 1.5
  Filled 2021-08-05 (×2): qty 2

## 2021-08-05 MED ORDER — FUROSEMIDE 10 MG/ML IJ SOLN
40.0000 mg | Freq: Every day | INTRAMUSCULAR | Status: DC
  Administered 2021-08-06: 40 mg via INTRAVENOUS
  Filled 2021-08-05: qty 4

## 2021-08-05 MED ORDER — AMLODIPINE BESYLATE 2.5 MG PO TABS
2.5000 mg | ORAL_TABLET | Freq: Every day | ORAL | Status: DC
  Administered 2021-08-06: 2.5 mg via ORAL
  Filled 2021-08-05 (×2): qty 1

## 2021-08-05 MED ORDER — VITAMIN B-12 1000 MCG PO TABS
500.0000 ug | ORAL_TABLET | Freq: Every day | ORAL | Status: DC
  Administered 2021-08-06 – 2021-08-10 (×5): 500 ug via ORAL
  Filled 2021-08-05 (×5): qty 1

## 2021-08-05 MED ORDER — POTASSIUM CHLORIDE CRYS ER 20 MEQ PO TBCR
40.0000 meq | EXTENDED_RELEASE_TABLET | Freq: Every day | ORAL | Status: DC
  Administered 2021-08-06: 40 meq via ORAL
  Filled 2021-08-05 (×2): qty 2

## 2021-08-05 MED ORDER — CABERGOLINE 0.5 MG PO TABS
0.5000 mg | ORAL_TABLET | ORAL | Status: DC
  Administered 2021-08-06 – 2021-08-08 (×2): 0.5 mg via ORAL
  Filled 2021-08-05 (×2): qty 1

## 2021-08-05 MED ORDER — RIVAROXABAN 20 MG PO TABS
20.0000 mg | ORAL_TABLET | Freq: Every day | ORAL | Status: DC
  Administered 2021-08-06 – 2021-08-09 (×4): 20 mg via ORAL
  Filled 2021-08-05 (×4): qty 1

## 2021-08-05 MED ORDER — ACETAMINOPHEN 325 MG PO TABS: 325.0000 mg | ORAL_TABLET | Freq: Four times a day (QID) | ORAL | Status: DC | PRN

## 2021-08-05 MED ORDER — ADULT MULTIVITAMIN W/MINERALS CH
1.0000 | ORAL_TABLET | Freq: Every day | ORAL | Status: DC
  Administered 2021-08-06 – 2021-08-10 (×5): 1 via ORAL
  Filled 2021-08-05 (×5): qty 1

## 2021-08-05 MED ORDER — VITAMIN D 25 MCG (1000 UNIT) PO TABS
1000.0000 [IU] | ORAL_TABLET | ORAL | Status: DC
  Administered 2021-08-06 – 2021-08-10 (×3): 1000 [IU] via ORAL
  Filled 2021-08-05 (×3): qty 1

## 2021-08-05 MED ORDER — SIMVASTATIN 20 MG PO TABS
20.0000 mg | ORAL_TABLET | Freq: Every day | ORAL | Status: DC
  Administered 2021-08-06 – 2021-08-09 (×5): 20 mg via ORAL
  Filled 2021-08-05 (×5): qty 1

## 2021-08-05 NOTE — Assessment & Plan Note (Signed)
Along with nausea and vomiting. Suspect likely viral illness. Check C.diff but low suspicion with lack of fever or leukocytosis

## 2021-08-05 NOTE — Assessment & Plan Note (Addendum)
Reate controlled atrial fibrillation, continue anticoagulation with rivaroxaban.

## 2021-08-05 NOTE — ED Notes (Signed)
Blanket and ice provided per Dr. Christie Beckers  ?

## 2021-08-05 NOTE — Assessment & Plan Note (Signed)
-   Continue home antihypertensives

## 2021-08-05 NOTE — ED Provider Triage Note (Signed)
Emergency Medicine Provider Triage Evaluation Note ? ?Johnston Ebbs , a 71 y.o. female  was evaluated in triage.  Pt complains of sob x1 week. C/o cough ,wheezing, increased leg swelling. Compliant with diuretics. No cp. ? ?Review of Systems  ?Positive: Sob, wheezing, cough, leg swelling ?Negative: Chest pain ? ?Physical Exam  ?BP (!) 169/129 (BP Location: Left Wrist)   Pulse (!) 114   Temp 98 ?F (36.7 ?C) (Oral)   Resp (!) 24   SpO2 100%  ?Gen:   Awake, no distress   ?Resp:  Normal effort  ?MSK:   Moves extremities without difficulty  ?Other:  Tachypneic, ble edema 3+, decreased breath sounds throughout, rales, rare expiratory wheezing  ? ?Medical Decision Making  ?Medically screening exam initiated at 4:22 PM.  Appropriate orders placed.  ANGELENE ROME was informed that the remainder of the evaluation will be completed by another provider, this initial triage assessment does not replace that evaluation, and the importance of remaining in the ED until their evaluation is complete. ? ? ?  ?Rodney Booze, PA-C ?08/05/21 1623 ? ?

## 2021-08-05 NOTE — Assessment & Plan Note (Signed)
Continue cabergoline. Follows with neurosurgery Dr. Christella Noa. ?

## 2021-08-05 NOTE — Assessment & Plan Note (Deleted)
Appears to be new diagnosis. Previously on Lasix for chronic LE edema. ?Last echocardiogram on 05/2017 with EF of 65 to 70%.  Mildly thickened mitral valve.  Mildly elevated pulmonary pressure. ?-has been off Lasix for the past week due to GI illness ?-continue IV Lasix 40mg  daily ?-strict intake and output ?-daily weights ?-repeat echocardiogram ?

## 2021-08-05 NOTE — Assessment & Plan Note (Addendum)
-   Continue CPAP

## 2021-08-05 NOTE — Assessment & Plan Note (Addendum)
-  Chronically elevated since 2013 with baseline around 2 but today elevated to 5.6 without abdominal pain or jaundice.  She has GI symptoms with nausea, vomiting and diarrhea but suspect this is unrelated. No abdominal tenderness. Last abdomen ultrasound in 2021 was negative. She was advised to see GI in the past but has not done so.  -  Check fractionated bilirubin.

## 2021-08-05 NOTE — H&P (Signed)
°History and Physical  ° ° °Patient: [name] MRN:[id number] DOB: 03/19/1951 °DOA: 08/05/2021 °DOS: the patient was seen and examined on 08/05/2021 °PCP: [name], [name], MD  °Patient coming from: Home ° °Chief Complaint:  °Chief Complaint  °Patient presents with  ° Shortness of Breath  ° °HPI: [name] is a 71 y.o. female with medical history significant of remote history of right breast cancer s/p mastectomy, chemotherapy, radiation, antiestrogen therapy in remission, chronic atrial fibrillation on Xarelto, hypertension, hyperlipidemia and OSA on CPAP who presents with concerns of increasing shortness of breath. ° °Patient reports being out of town this week when she developed acute onset of worsening shortness of breath.  Has been unable to lay down to sleep.  Also notes increasing lower extremity edema.  She also developed nausea, vomiting and up to 4 episodes of diarrhea daily.  Denies any abdominal pain or fever.  Due to her GI illness, she stopped taking her Lasix to avoid using the bathroom. ° °In the ED, she was afebrile, tachycardic and mildly hypertensive with BP of 169/120.  Also tachypneic requiring 2 L of O2 via nasal cannula.  CBC was unremarkable.  BMP was unremarkable. °Baseline total bilirubin noted to be around 2 but was elevated to 5.6 today with normal LFTs. °Troponin of 17-->22.  BUN of 630. ° °Chest x-ray shows cardiomegaly with bilateral pulmonary opacities likely pulmonary edema. °EKG in atrial fibrillation. ° °Patient was given IV Lasix 40 mg and hospitalist was called for management of acute CHF exacerbation. ° °Review of Systems: As mentioned in the history of present illness. All other systems reviewed and are negative. °Past Medical History:  °Diagnosis Date  ° Arthritis   ° Asthma   ° Blood transfusion   ° hx of last one in 1987  ° Breast cancer (HCC) 2013  ° Right Breast Cancer  ° Cancer of lower-inner quadrant of female breast (HCC) 07/24/2011  ° right breast cancer  /IDC,stage IIB,er/pr=+,her2=Neg  ° Glaucoma   ° Heart murmur   ° History of chemotherapy   ° taxotere/cytoxan 6 cycles 09/22/11-01/05/12 day 2 neulasta  ° Hyperlipemia   ° Hypertension   ° Lymphedema   ° right arm wears a sleeve  ° MRSA (methicillin resistant staph aureus) culture positive   ° 08/2011  ° Neuromuscular disorder (HCC)   ° peripheral neuropathy hands feet  ° OSA (obstructive sleep apnea)   ° CPAP settings- ?   ° Persistent atrial fibrillation (HCC)   ° Pituitary abnormality (HCC) 05-25-13  ° small pituitary growth-appears stable- Dr. Cabbell follows  ° °Past Surgical History:  °Procedure Laterality Date  ° ABDOMINAL HYSTERECTOMY  2007  ° TAH/BSO  ° BREAST SURGERY    ° mastectomy RIGHT  ° CARDIOVERSION N/A 06/02/2017  ° Procedure: CARDIOVERSION;  Surgeon: Nelson, Katarina H, MD;  Location: [facility];  Service: Cardiovascular;  Laterality: N/A;  ° CARPAL TUNNEL RELEASE Right   ° 2012  ° CHOLECYSTECTOMY  1987  ° open - Dr Arkin  ° COLONOSCOPY N/A 06/13/2013  ° Procedure: COLONOSCOPY;  Surgeon: Jyothi Mann, MD;  Location: [facility];  Service: Endoscopy;  Laterality: N/A;  ° DILATION AND CURETTAGE OF UTERUS    ° x3  ° EYE SURGERY    ° laser eye surgery for glaucoma  ° MASTECTOMY MODIFIED RADICAL Right 08/14/11  ° right , ER/PR +, HER2 -  ° port-a-cath insertion    ° port-a-cath removal 1'14  ° PORT-A-CATH REMOVAL  06/22/2012  ° Procedure:   Procedure: MINOR REMOVAL PORT-A-CATH;  Surgeon: Haywood Lasso, MD;  Location: [place];  Service: General;  Laterality: Left;   PORTACATH PLACEMENT  09/11/2011   Procedure: INSERTION PORT-A-CATH;  Surgeon: Joyice Faster. Cornett, MD;  Location: [facility];  Service: General;  Laterality: N/A;  Insert of Port   TUBAL LIGATION     Social History:  reports that she has never smoked. She has never used smokeless tobacco. She reports that she does not drink alcohol and does not use drugs.  Allergies  Allergen Reactions   Trandolapril-Verapamil Hcl Er Swelling    Causes  lips to swell [name]" is the name brand)   Trandolapril-Verapamil Hcl Er     Family History  Problem Relation Age of Onset   Arthritis Mother    Cancer Father 33       prostate ca   Breast cancer Neg Hx     Prior to Admission medications   Medication Sig Start Date End Date Taking? Authorizing Provider  acetaminophen (TYLENOL) 325 MG tablet Take 325-650 mg by mouth every 6 (six) hours as needed (for pain.).    [provider]  albuterol (PROAIR HFA) 108 (90 Base) MCG/ACT inhaler Inhale 1-2 puffs into the lungs every 6 (six) hours as needed for wheezing or shortness of breath. For shortness of breath 03/28/20   Parrett, Tammy S, NP  amLODipine (NORVASC) 2.5 MG tablet Take 1 tablet (2.5 mg total) by mouth daily. Please make overdue appt with Dr. Irish Lack before anymore refills. 1st attempt 10/21/19   Jettie Booze, MD  cabergoline (DOSTINEX) 0.5 MG tablet Take 0.5 mg by mouth 2 (two) times a week. Tuesday and Thursday    [provider]  Cholecalciferol (VITAMIN D3) 1000 UNITS CAPS Take 1,000 Units by mouth every other day.     [provider]  furosemide (LASIX) 40 MG tablet Take 1 tablet (40 mg total) by mouth daily. 06/12/21   Jettie Booze, MD  GLUCOSAMINE HCL PO Take 1 tablet by mouth daily.    [provider]  hydroxypropyl methylcellulose / hypromellose (ISOPTO TEARS / GONIOVISC) 2.5 % ophthalmic solution Place 1 drop into both eyes 3 (three) times daily as needed for dry eyes.    [provider]  losartan (COZAAR) 100 MG tablet Take 1 tablet (100 mg total) by mouth daily. 06/12/21   Jettie Booze, MD  metoprolol succinate (TOPROL-XL) 50 MG 24 hr tablet TAKE ONE AND ONE-HALF TABLETS TWICE A DAY 01/15/21   Jettie Booze, MD  Multiple Vitamin (MULITIVITAMIN WITH MINERALS) TABS Take 1 tablet by mouth daily with breakfast.     [provider]  potassium chloride SA (KLOR-CON) 20 MEQ tablet Take 2 tablets by mouth  daily. Take when taking lasix(Furosemide). Please make overdue appt with Dr. Irish Lack before anymore refills. 1st attempt 10/21/19   Jettie Booze, MD  [facility] 1-0.2 % SUSP Place 1 drop into both eyes 3 (three) times daily.  02/03/17   [provider]  simvastatin (ZOCOR) 20 MG tablet TAKE 1 TABLET AT BEDTIME 10/07/19   Imogene Burn, PA-C  vitamin B-12 (CYANOCOBALAMIN) 500 MCG tablet Take 500 mcg by mouth daily.    [provider]  XARELTO 20 MG TABS tablet TAKE 1 TABLET DAILY WITH SUPPER 06/26/21   Jettie Booze, MD    Physical Exam: Vitals:   08/05/21 2130 08/05/21 2145 08/05/21 2200 08/05/21 2215  BP: (!) 166/101 126/89 (!) 147/99   Pulse: 85  83 83 84  Resp: (!) 22 (!) 23 (!) 22 (!) 26  Temp:      TempSrc:      SpO2: 100% 98% 99% 100%   Constitutional: NAD, calm, comfortable, obese female laying upright in bed at approximately 40 degree incline Eyes: Lids and conjunctivae normal ENMT: Mucous membranes are moist.  Neck: normal, supple Respiratory: clear to auscultation bilaterally, no wheezing, no crackles. Normal respiratory effort on 2 L via nasal cannula. No accessory muscle use.  Able to speak in full sentences. Cardiovascular: Regular rate and rhythm, no murmurs / rubs / gallops. No extremity edema.  Nonpitting edema bilateral lower extremity up to mid pretibial region. Abdomen: Left, nondistended, no tenderness, no masses palpated. No hepatosplenomegaly. Bowel sounds positive.  Musculoskeletal: no clubbing / cyanosis. No joint deformity upper and lower extremities. Good ROM, no contractures. Normal muscle tone.  Skin: no rashes, lesions, ulcers. No induration Neurologic: CN 2-12 grossly intact. Strength 5/5 in all 4.  Psychiatric: Normal judgment and insight. Alert and oriented x 3. Normal mood. Data Reviewed:  See HPI  Assessment and Plan: * CHF (congestive heart failure) ([place]) Appears to be new diagnosis. Previously on Lasix for chronic LE  edema. Last echocardiogram on 05/2017 with EF of 65 to 70%.  Mildly thickened mitral valve.  Mildly elevated pulmonary pressure. -has been off Lasix for the past week due to GI illness -continue IV Lasix 70m daily -strict intake and output -daily weights -repeat echocardiogram  Acute respiratory failure with hypoxia (HCC) Secondary to CHF exac Maintain O2 >92%. Wean as tolerated with IV diuresis.  Chronic atrial fibrillation (HCC) -rate controlled. Continue daily Xarelto  Diarrhea Along with nausea and vomiting. Suspect likely viral illness. Check C.diff but low suspicion with lack of fever or leukocytosis  Hyperbilirubinemia -Chronically elevated since 2013 with baseline around 2 but today elevated to 5.6 without abdominal pain or jaundice.  She has GI symptoms with nausea, vomiting and diarrhea but suspect this is unrelated. No abdominal tenderness. Last abdomen ultrasound in 2021 was negative. She was advised to see GI in the past but has not done so.  -  Check fractionated bilirubin.  Pituitary macroadenoma (HCC) Continue cabergoline. Follows with neurosurgery Dr. CChristella Noa  Breast cancer of lower-inner quadrant of right female breast ([facility] - Has right mastectomy s/p chemo, radiation and antiestrogen therapy in remission -follows with Dr. [facility]  Essential hypertension - Continue home antihypertensives  Obstructive sleep apnea - Continue CPAP      Advance Care Planning:   Code Status: Full Code   Consults: none  Family Communication: No family at bedside  Severity of Illness: The appropriate patient status for this patient is INPATIENT. Inpatient status is judged to be reasonable and necessary in order to provide the required intensity of service to ensure the patient's safety. The patient's presenting symptoms, physical exam findings, and initial radiographic and laboratory data in the context of their chronic comorbidities is felt to place them at high risk  for further clinical deterioration. Furthermore, it is not anticipated that the patient will be medically stable for discharge from the hospital within 2 midnights of admission.   * I certify that at the point of admission it is my clinical judgment that the patient will require inpatient hospital care spanning beyond 2 midnights from the point of admission due to high intensity of service, high risk for further deterioration and high frequency of surveillance required.*  Author: COrene Desanctis DO 08/05/2021 10:49 PM  For  on call review [email or web address].

## 2021-08-05 NOTE — Assessment & Plan Note (Signed)
-   Has right mastectomy s/p chemo, radiation and antiestrogen therapy in remission ?-follows with Dr. [facility] ?

## 2021-08-05 NOTE — ED Triage Notes (Signed)
Patient complains of shortness of breath, nausea, and diarrhea that started one week ago. Does not wear O2 at baseline, room air SpO2 88% and placed on 2L O2 [place], SpO2 100% on 2L. Patient alert, oriented, and in no apparent distress at this time. ?

## 2021-08-05 NOTE — ED Provider Notes (Signed)
?[facility] ?Provider Note ? ? ?CSN: [id number] ?Arrival date & time: 08/05/21  1504 ? ?  ? ?History ? ?Chief Complaint  ?Patient presents with  ? Shortness of Breath  ? ? ?[name] is a 71 y.o. female. ? ?HPI ?She presents for evaluation of shortness of breath.  She was hypoxic so was placed on nasal cannula oxygen at on arrival.  She has had shortness of breath for 1 week.  This occurred while she was traveling to [place] by car.  She also complains of nausea, vomiting and diarrhea.  This morning she was vomiting so could not take her medicine.  She denies fever. ?  ? ?Home Medications ?Prior to Admission medications   ?Medication Sig Start Date End Date Taking? Authorizing Provider  ?acetaminophen (TYLENOL) 325 MG tablet Take 325-650 mg by mouth every 6 (six) hours as needed (for pain.).    [provider]  ?albuterol (PROAIR HFA) 108 (90 Base) MCG/ACT inhaler Inhale 1-2 puffs into the lungs every 6 (six) hours as needed for wheezing or shortness of breath. For shortness of breath 03/28/20   Parrett, Fonnie Mu, NP  ?amLODipine (NORVASC) 2.5 MG tablet Take 1 tablet (2.5 mg total) by mouth daily. Please make overdue appt with Dr. Irish Lack before anymore refills. 1st attempt 10/21/19   Jettie Booze, MD  ?cabergoline (DOSTINEX) 0.5 MG tablet Take 0.5 mg by mouth 2 (two) times a week. Tuesday and Thursday    [provider]  ?Cholecalciferol (VITAMIN D3) 1000 UNITS CAPS Take 1,000 Units by mouth every other day.     [provider]  ?furosemide (LASIX) 40 MG tablet Take 1 tablet (40 mg total) by mouth daily. 06/12/21   Jettie Booze, MD  ?GLUCOSAMINE HCL PO Take 1 tablet by mouth daily.    [provider]  ?hydroxypropyl methylcellulose / hypromellose (ISOPTO TEARS / GONIOVISC) 2.5 % ophthalmic solution Place 1 drop into both eyes 3 (three) times daily as needed for dry eyes.    [provider]  ?losartan (COZAAR) 100  MG tablet Take 1 tablet (100 mg total) by mouth daily. 06/12/21   Jettie Booze, MD  ?metoprolol succinate (TOPROL-XL) 50 MG 24 hr tablet TAKE ONE AND ONE-HALF TABLETS TWICE A DAY 01/15/21   Jettie Booze, MD  ?Multiple Vitamin (MULITIVITAMIN WITH MINERALS) TABS Take 1 tablet by mouth daily with breakfast.     [provider]  ?potassium chloride SA (KLOR-CON) 20 MEQ tablet Take 2 tablets by mouth daily. Take when taking lasix(Furosemide). Please make overdue appt with Dr. Irish Lack before anymore refills. 1st attempt 10/21/19   Jettie Booze, MD  ?[facility] 1-0.2 % SUSP Place 1 drop into both eyes 3 (three) times daily.  02/03/17   [provider]  ?simvastatin (ZOCOR) 20 MG tablet TAKE 1 TABLET AT BEDTIME 10/07/19   Imogene Burn, PA-C  ?vitamin B-12 (CYANOCOBALAMIN) 500 MCG tablet Take 500 mcg by mouth daily.    [provider]  ?XARELTO 20 MG TABS tablet TAKE 1 TABLET DAILY WITH SUPPER 06/26/21   Jettie Booze, MD  ?   ? ?Allergies    ?Trandolapril-verapamil hcl er and Trandolapril-verapamil hcl er   ? ?Review of Systems   ?Review of Systems ? ?Physical Exam ?Updated Vital Signs ?BP (!) 159/118   Pulse 88   Temp 98 ?F (36.7 ?C) (Oral)   Resp (!) 28   SpO2 98%  ?Physical Exam ? ?ED Results /  Procedures / Treatments   ?Labs ?(all labs ordered are listed, but only abnormal results are displayed) ?Labs Reviewed  ?COMPREHENSIVE METABOLIC PANEL - Abnormal; Notable for the following components:  ?    Result Value  ? Glucose, Bld 105 (*)   ? Creatinine, Ser 1.13 (*)   ? Total Bilirubin 5.6 (*)   ? GFR, Estimated 52 (*)   ? All other components within normal limits  ?BRAIN NATRIURETIC PEPTIDE - Abnormal; Notable for the following components:  ? B Natriuretic Peptide 630.1 (*)   ? All other components within normal limits  ?TROPONIN I (HIGH SENSITIVITY) - Abnormal; Notable for the following components:  ? Troponin I (High Sensitivity) 22 (*)   ? All other components  within normal limits  ?RESP PANEL BY RT-PCR (FLU A&B, COVID) ARPGX2  ?CBC WITH DIFFERENTIAL/PLATELET  ?TROPONIN I (HIGH SENSITIVITY)  ? ? ?EKG ?EKG Interpretation ? ?Date/Time:  Monday August 05 2021 16:24:50 EDT ?Ventricular Rate:  93 ?PR Interval:    ?QRS Duration: 66 ?QT Interval:  334 ?QTC Calculation: 415 ?R Axis:   85 ?Text Interpretation: Atrial fibrillation Low voltage QRS Nonspecific ST abnormality Abnormal ECG When compared with ECG of 21-Jun-2018 10:04, PREVIOUS ECG IS PRESENT since last tracing no significant change Confirmed by Daleen Bo [phone]) on 08/05/2021 8:38:08 PM ? ?Radiology ?DG Chest 2 View ? ?Result Date: 08/05/2021 ?CLINICAL DATA:  Shortness of breath EXAM: CHEST - 2 VIEW COMPARISON:  03/28/2020 FINDINGS: Gross cardiomegaly. Bilateral heterogeneous and interstitial pulmonary opacity. Disc degenerative disease of the thoracic spine. IMPRESSION: Cardiomegaly with bilateral heterogeneous and interstitial pulmonary opacity, likely edema. Electronically Signed   By: Delanna Ahmadi M.D.   On: 08/05/2021 16:55   ? ?Procedures ?Procedures  ? ? ?Medications Ordered in ED ?Medications  ?furosemide (LASIX) injection 40 mg (40 mg Intravenous Given 08/05/21 1946)  ? ? ?ED Course/ Medical Decision Making/ A&P ?  ?                        ?Medical Decision Making ?She is here for evaluation of shortness of breath, gradually worse, bothered both at rest and with exertion.  She is able to walk and came in by private vehicle.  She typically takes diuretic for peripheral edema, has never had congestive heart failure. ? ?Amount and/or Complexity of Data Reviewed ?Labs: ordered. ?   Details: CBC, metabolic panel, BMP, viral panel, troponin-no except creatinine high, total bilirubin high, BNP high, delta troponin with slight elevation from initial normal. ?Radiology: ordered and independent interpretation performed. ?   Details: Chest x-ray, mild pulmonary edema, no infiltrate ?ECG/medicine tests: ordered. ?    Details: Cardiac monitor-atrial fibrillation ?Discussion of management or test interpretation with external provider(s): Consult hospitalist to admit. ? ?Risk ?Prescription drug management. ?Decision regarding hospitalization. ?Risk Details: Patient presenting with gradually worsening shortness of breath with hypoxia, stabilized with nasal cannula oxygen in the ED.  Treated with IV Lasix for suspected acute congestive heart failure with bilateral pleural fluid.  Mild troponin bump, nonspecific.  Last cardiac echo, 2019, did not show heart failure.  She has chronic atrial fibrillation, rate controlled, currently.  Doubt pneumonia, PE or ACS.  She will require hospitalization for stabilization.  She will need a cardiac echo and probably consultation with cardiology. ? ?Critical Care ?Total time providing critical care: 35 minutes ? ? ? ? ? ? ? ? ? ?Final Clinical Impression(s) / ED Diagnoses ?Final diagnoses:  ?Acute congestive heart failure, unspecified heart  failure type ([place])  ?Hypoxia  ?Nausea and vomiting, unspecified vomiting type  ? ? ?Rx / DC Orders ?ED Discharge Orders   ? ? None  ? ?  ? ? ?  ?Daleen Bo, MD ?08/05/21 2132 ? ?

## 2021-08-05 NOTE — Assessment & Plan Note (Signed)
Secondary to CHF exac Maintain O2 >92%. Wean as tolerated with IV diuresis.

## 2021-08-06 ENCOUNTER — Inpatient Hospital Stay (HOSPITAL_COMMUNITY): Payer: Medicare Other

## 2021-08-06 DIAGNOSIS — I5021 Acute systolic (congestive) heart failure: Secondary | ICD-10-CM | POA: Diagnosis not present

## 2021-08-06 DIAGNOSIS — I509 Heart failure, unspecified: Secondary | ICD-10-CM | POA: Diagnosis not present

## 2021-08-06 DIAGNOSIS — E66813 Obesity, class 3: Secondary | ICD-10-CM | POA: Diagnosis present

## 2021-08-06 DIAGNOSIS — J9601 Acute respiratory failure with hypoxia: Secondary | ICD-10-CM | POA: Diagnosis not present

## 2021-08-06 DIAGNOSIS — E785 Hyperlipidemia, unspecified: Secondary | ICD-10-CM | POA: Diagnosis present

## 2021-08-06 DIAGNOSIS — C50311 Malignant neoplasm of lower-inner quadrant of right female breast: Secondary | ICD-10-CM | POA: Diagnosis not present

## 2021-08-06 DIAGNOSIS — E876 Hypokalemia: Secondary | ICD-10-CM | POA: Diagnosis present

## 2021-08-06 LAB — BASIC METABOLIC PANEL
Anion gap: 11 (ref 5–15)
BUN: 12 mg/dL (ref 8–23)
CO2: 28 mmol/L (ref 22–32)
Calcium: 9 mg/dL (ref 8.9–10.3)
Chloride: 103 mmol/L (ref 98–111)
Creatinine, Ser: 1.08 mg/dL — ABNORMAL HIGH (ref 0.44–1.00)
GFR, Estimated: 55 mL/min — ABNORMAL LOW (ref >60)
Glucose, Bld: 122 mg/dL — ABNORMAL HIGH (ref 70–99)
Potassium: 3.3 mmol/L — ABNORMAL LOW (ref 3.5–5.1)
Sodium: 142 mmol/L (ref 135–145)

## 2021-08-06 LAB — ECHOCARDIOGRAM COMPLETE
AR max vel: 2.27 cm2
AV Area VTI: 2.51 cm2
AV Area mean vel: 2.54 cm2
AV Mean grad: 5 mmHg
AV Peak grad: 9.2 mmHg
Ao pk vel: 1.52 m/s
Area-P 1/2: 3.68 cm2
Height: 62 in
MV VTI: 2.09 cm2
P 1/2 time: 483 msec
S' Lateral: 2.3 cm
Weight: 4768.99 oz

## 2021-08-06 LAB — BILIRUBIN, FRACTIONATED(TOT/DIR/INDIR)
Bilirubin, Direct: 0.8 mg/dL — ABNORMAL HIGH (ref 0.0–0.2)
Indirect Bilirubin: 3.8 mg/dL — ABNORMAL HIGH (ref 0.3–0.9)
Total Bilirubin: 4.6 mg/dL — ABNORMAL HIGH (ref 0.3–1.2)

## 2021-08-06 MED ORDER — DAPAGLIFLOZIN PROPANEDIOL 10 MG PO TABS
10.0000 mg | ORAL_TABLET | Freq: Every day | ORAL | Status: DC
  Administered 2021-08-06 – 2021-08-08 (×3): 10 mg via ORAL
  Filled 2021-08-06 (×3): qty 1

## 2021-08-06 MED ORDER — LIVING BETTER WITH HEART FAILURE BOOK: Freq: Once | Status: DC

## 2021-08-06 MED ORDER — SPIRONOLACTONE 12.5 MG HALF TABLET
12.5000 mg | ORAL_TABLET | Freq: Every day | ORAL | Status: DC
  Administered 2021-08-06 – 2021-08-08 (×3): 12.5 mg via ORAL
  Filled 2021-08-06 (×3): qty 1

## 2021-08-06 MED ORDER — FUROSEMIDE 10 MG/ML IJ SOLN: 60.0000 mg | Freq: Every day | INTRAMUSCULAR | Status: DC

## 2021-08-06 MED ORDER — CHLORHEXIDINE GLUCONATE 0.12 % MT SOLN
15.0000 mL | Freq: Two times a day (BID) | OROMUCOSAL | Status: DC
  Administered 2021-08-06 – 2021-08-09 (×8): 15 mL via OROMUCOSAL
  Filled 2021-08-06 (×8): qty 15

## 2021-08-06 MED ORDER — POTASSIUM CHLORIDE CRYS ER 20 MEQ PO TBCR
40.0000 meq | EXTENDED_RELEASE_TABLET | Freq: Once | ORAL | Status: AC
  Administered 2021-08-06: 40 meq via ORAL
  Filled 2021-08-06: qty 2

## 2021-08-06 MED ORDER — GUAIFENESIN-DM 100-10 MG/5ML PO SYRP
5.0000 mL | ORAL_SOLUTION | ORAL | Status: DC | PRN
  Administered 2021-08-06: 5 mL via ORAL
  Filled 2021-08-06 (×2): qty 5

## 2021-08-06 MED ORDER — FUROSEMIDE 10 MG/ML IJ SOLN
60.0000 mg | Freq: Two times a day (BID) | INTRAMUSCULAR | Status: DC
  Administered 2021-08-06 – 2021-08-08 (×5): 60 mg via INTRAVENOUS
  Filled 2021-08-06 (×5): qty 6

## 2021-08-06 NOTE — Progress Notes (Signed)
Echocardiogram ?2D Echocardiogram has been performed. ? ?[name] ?08/06/2021, 8:57 AM ?

## 2021-08-06 NOTE — Progress Notes (Signed)
Education Assessment and Provision: ? ?Detailed education and instructions provided on heart failure disease management including the following: ? ?Signs and symptoms of Heart Failure ?When to call the physician ?Importance of daily weights ?Low sodium diet ?Fluid restriction ?Medication management: encouraged compliance with diuretics.  ?Anticipated future follow-up appointments ? ?Patient education given on each of the above topics.  Patient acknowledges understanding via teach back method and acceptance of all instructions. ? ?Earnestine Leys, BSN, RN ?Heart Failure Nurse Navigator ?[phone]  ?

## 2021-08-06 NOTE — Evaluation (Signed)
Physical Therapy Evaluation ?Patient Details ?Name: [name] ?MRN: [id number] ?DOB: 1951/05/17 ?Today's Date: 08/06/2021 ? ?History of Present Illness ? Patient is a 71 y/o female who presents on 08/05/21 with SOB, diarrhea, nausea. CXR- bil pulmonary opacities likely pulmonary edema. Admitted with acute respiratory failure due to decompensated heart failure. PMH includes breast ca s/p mastectomy, chemotherapy and radiation, A-fib. HTN.  ?Clinical Impression ? Patient presents with generalized weakness, deconditioning, decreased cardiovascular endurance, dyspnea on exertion and impaired mobility s/p above. Pt lives at home with family and uses [facility] for ambulation; more recently needing RW for support. Today, pt requires Min A for all mobility and uses RW for ambulation. Sp02 dropped to 84% on RA with activity. Encouraged OOB as much as tolerated to improve overall strength/mobility as well as to wean supplemental 02. Would benefit from use of rollator- will trial next session. Will follow acutely to maximize independence and mobility prior to return home. ?   ? ?Recommendations for follow up therapy are one component of a multi-disciplinary discharge planning process, led by the attending physician.  Recommendations may be updated based on patient status, additional functional criteria and insurance authorization. ? ?Follow Up Recommendations Home health PT (pending improvement) ? ?  ?Assistance Recommended at Discharge Intermittent Supervision/Assistance  ?Patient can return home with the following ? A little help with walking and/or transfers;A little help with bathing/dressing/bathroom;Assistance with cooking/housework;Help with stairs or ramp for entrance ? ?  ?Equipment Recommendations Rollator (4 wheels) (pending trial)  ?Recommendations for Other Services ?    ?  ?Functional Status Assessment Patient has had a recent decline in their functional status and demonstrates the ability to make significant  improvements in function in a reasonable and predictable amount of time.  ? ?  ?Precautions / Restrictions Precautions ?Precautions: Other (comment);Fall ?Precaution Comments: watch 02 ?Restrictions ?Weight Bearing Restrictions: No  ? ?  ? ?Mobility ? Bed Mobility ?  ?  ?  ?  ?  ?  ?  ?General bed mobility comments: Sitting on BSC upon PT arrival. ?  ? ?Transfers ?Overall transfer level: Needs assistance ?Equipment used: Rolling walker (2 wheels), None ?Transfers: Sit to/from Stand ?Sit to Stand: Min assist ?  ?  ?  ?  ?  ?General transfer comment: MIn A to power to standing with wide boS, stood from EOB x2, from [facility] x1, tx to recliner x1. ?  ? ?Ambulation/Gait ?Ambulation/Gait assistance: Min guard, Min assist ?Gait Distance (Feet): 40 Feet ?Assistive device: Rolling walker (2 wheels) ?Gait Pattern/deviations: Step-through pattern, Decreased stride length, Wide base of support ?Gait velocity: decreased ?Gait velocity interpretation: <1.31 ft/sec, indicative of household ambulator ?  ?General Gait Details: Slow, waddling like gait with wide BoS and 2/4 DOE. Cues for RW management. Sp02 dropped to 84% on RA during walking, donned 2L and able to maintain Sp02 >90%. ? ?Stairs ?  ?  ?  ?  ?  ? ?Wheelchair Mobility ?  ? ?Modified Rankin (Stroke Patients Only) ?  ? ?  ? ?Balance Overall balance assessment: Needs assistance ?Sitting-balance support: Feet supported, No upper extremity supported ?Sitting balance-Leahy Scale: Fair ?  ?  ?Standing balance support: During functional activity, Single extremity supported ?Standing balance-Leahy Scale: Poor ?Standing balance comment: Does better with UE support, able to stand at sink and wash hands with UE support, able to perform pericare without difficulty. Needs UE support for walking. ?  ?  ?  ?  ?  ?  ?  ?  ?  ?  ?  ?   ? ? ? ?  Pertinent Vitals/Pain Pain Assessment ?Pain Assessment: No/denies pain  ? ? ?Home Living Family/patient expects to be discharged to:: Private  residence ?Living Arrangements:  (autistic child) ?Available Help at Discharge: Family;Available PRN/intermittently ?Type of Home: House ?Home Access: Stairs to enter ?Entrance Stairs-Rails: Right ?Entrance Stairs-Number of Steps: 3 ?  ?Home Layout: One level ?Home Equipment: Conservation officer, nature (2 wheels);Cane - single point ?   ?  ?Prior Function Prior Level of Function : Independent/Modified Independent ?  ?  ?  ?  ?  ?  ?Mobility Comments: Uses SPC for ambulation and in the last week has been using RW. ?ADLs Comments: independent, does some IADLs as well, drives. ?  ? ? ?Hand Dominance  ?   ? ?  ?Extremity/Trunk Assessment  ? Upper Extremity Assessment ?Upper Extremity Assessment: Defer to OT evaluation ?  ? ?Lower Extremity Assessment ?Lower Extremity Assessment: Generalized weakness ?  ? ?   ?Communication  ? Communication: No difficulties  ?Cognition Arousal/Alertness: Awake/alert ?Behavior During Therapy: [facility] for tasks assessed/performed ?Overall Cognitive Status: Within Functional Limits for tasks assessed ?  ?  ?  ?  ?  ?  ?  ?  ?  ?  ?  ?  ?  ?  ?  ?  ?  ?  ?  ? ?  ?General Comments General comments (skin integrity, edema, etc.): Spouse present during session. ? ?  ?Exercises    ? ?Assessment/Plan  ?  ?PT Assessment Patient needs continued PT services  ?PT Problem List Decreased strength;Decreased mobility;Decreased balance;Decreased knowledge of use of DME;Decreased activity tolerance;Cardiopulmonary status limiting activity ? ?   ?  ?PT Treatment Interventions DME instruction;Therapeutic exercise;Patient/family education;Therapeutic activities;Balance training;Gait training;Stair training   ? ?PT Goals (Current goals can be found in the Care Plan section)  ?Acute Rehab PT Goals ?Patient Stated Goal: be able to breathe ?PT Goal Formulation: With patient ?Time For Goal Achievement: 08/20/21 ?Potential to Achieve Goals: Fair ? ?  ?Frequency Min 3X/week ?  ? ? ?Co-evaluation   ?  ?  ?  ?  ? ? ?  ?AM-PAC PT "6  Clicks" Mobility  ?Outcome Measure Help needed turning from your back to your side while in a flat bed without using bedrails?: A Little ?Help needed moving from lying on your back to sitting on the side of a flat bed without using bedrails?: A Little ?Help needed moving to and from a bed to a chair (including a wheelchair)?: A Little ?Help needed standing up from a chair using your arms (e.g., wheelchair or bedside chair)?: A Little ?Help needed to walk in hospital room?: A Little ?Help needed climbing 3-5 steps with a railing? : A Little ?6 Click Score: 18 ? ?  ?End of Session Equipment Utilized During Treatment: Oxygen ?Activity Tolerance: Treatment limited secondary to medical complications (Comment) (drop in Sp02) ?Patient left: in chair;with call bell/phone within reach;with family/visitor present;with nursing/sitter in room ?Nurse Communication: Mobility status;Other (comment) (drop in Sp02) ?PT Visit Diagnosis: Other (comment);Muscle weakness (generalized) (M62.81) (DOE) ?  ? ?Time: 4580-9983 ?PT Time Calculation (min) (ACUTE ONLY): 32 min ? ? ?Charges:   PT Evaluation ?$PT Eval Moderate Complexity: 1 Mod ?PT Treatments ?$Gait Training: 8-22 mins ?  ?   ? ? ?Marisa Severin, PT, DPT ?Acute Rehabilitation Services ?Pager [phone] ?Office [phone] ? ? ? ? ?[place] ?08/06/2021, 3:09 PM ? ?

## 2021-08-06 NOTE — Assessment & Plan Note (Signed)
Renal function with serum cr at 1,0 with K at 3,3 and serum bicarbonate at 28, ?Na 142. ? ?Continue KCl, 40 meq x2 today to correct hypokalemia, follow renal function and electrolytes in am.  ?Continue diuresis.  ?

## 2021-08-06 NOTE — Assessment & Plan Note (Addendum)
Patient continue to be hypervolemic with significant lower extremity edema and positive JVD ? ?Documented urine output 2,650 ml.  ? ?Echocardiogram with LV EF 65 to 70% with moderate left ventricular hypertrophy. Interventricular septum is flattened in diastole ( D shaped left ventricle) consistent with right ventricular volume or pressure overload. Mild reduction in RV systolic function. Moderate elevation in pulmonary artery systolic pressure at 57.4 mmHg. Severe dilatation of bilateral atriums. Moderate MR and severe TR  ? ?Acute on chronic core pulmonale.  ? ?Plan to continue aggressive diuresis with furosemide, increase dose to 60 mg IV q12 hrs to target further negative fluid balance.  ?Add spironolactone and dapagliflozin  ?

## 2021-08-06 NOTE — Evaluation (Signed)
Occupational Therapy Evaluation ?Patient Details ?Name: [name] ?MRN: [id number] ?DOB: 1950-10-31 ?Today's Date: 08/06/2021 ? ? ?History of Present Illness Patient is a 71 y/o female who presents on 08/05/21 with SOB, diarrhea, nausea. CXR- bil pulmonary opacities likely pulmonary edema. Admitted with acute respiratory failure due to decompensated heart failure. PMH includes breast ca s/p mastectomy, chemotherapy and radiation, A-fib. HTN.  ? ?Clinical Impression ?  ?PT admitted with acute respiratory failure due to heart failure. Pt currently with functional limitiations due to the deficits listed below (see OT problem list). Pt currently requires 2 L [place] during session due to desaturation. Session focused on energy conservation and handout provided. Pt able to manage recliner for keeping legs elevated. Pt completed PT session and Ot session back to back. Pt declined bathroom transfer this session. Educated on bathroom every hour to keep from having incontinence and possible pad at home.  Pt will benefit from skilled OT to increase their independence and safety with adls and balance to allow discharge hhot. ?  ?   ? ?Recommendations for follow up therapy are one component of a multi-disciplinary discharge planning process, led by the attending physician.  Recommendations may be updated based on patient status, additional functional criteria and insurance authorization.  ? ?Follow Up Recommendations ? Home health OT  ?  ?Assistance Recommended at Discharge PRN  ?Patient can return home with the following A little help with walking and/or transfers;A little help with bathing/dressing/bathroom ? ?  ?Functional Status Assessment ? Patient has had a recent decline in their functional status and demonstrates the ability to make significant improvements in function in a reasonable and predictable amount of time.  ?Equipment Recommendations ? Tub/shower seat;Other (comment) (rollator)  ?  ?Recommendations for Other  Services   ? ? ?  ?Precautions / Restrictions Precautions ?Precautions: Fall;Other (comment) (O2 needs) ?Precaution Comments: watch 02 ?Restrictions ?Weight Bearing Restrictions: No  ? ?  ? ?Mobility Bed Mobility ?  ?  ?  ?  ?  ?  ?  ?General bed mobility comments: in chair on arrival ?  ? ?Transfers ?  ?  ?  ?  ?  ?  ?  ?  ?  ?  ?  ? ?  ?Balance Overall balance assessment: Needs assistance ?Sitting-balance support: Feet supported, No upper extremity supported ?Sitting balance-Leahy Scale: Fair ?  ?  ?  ?  ?  ?  ?  ?  ?  ?  ?  ?  ?  ?  ?  ?  ?   ? ?ADL either performed or assessed with clinical judgement  ? ?ADL Overall ADL's : Needs assistance/impaired ?Eating/Feeding: Independent ?  ?Grooming: Wash/dry hands;Wash/dry face;Supervision/safety;Sitting ?  ?  ?  ?  ?  ?  ?  ?Lower Body Dressing: Maximal assistance;Sit to/from stand ?Lower Body Dressing Details (indicate cue type and reason): pt at baseline has spouse don ted hose and socks daily. pt has a Secondary school teacher but never used it for dressing ?  ?  ?  ?  ?  ?  ?  ?General ADL Comments: pt completing ambulation and sitting in chair post PT eval. Ot arriving and recieving pt in chair. Focus of session on [facility] education with handout. education on strategies for adls  ? ? ? ?Vision Baseline Vision/History: 1 Wears glasses ?Ability to See in Adequate Light: 0 Adequate ?   ?   ?Perception   ?  ?Praxis   ?  ? ?Pertinent Vitals/Pain Pain Assessment ?  Pain Assessment: No/denies pain  ? ? ? ?Hand Dominance Right ?  ?Extremity/Trunk Assessment Upper Extremity Assessment ?Upper Extremity Assessment: Overall WFL for tasks assessed ?  ?Lower Extremity Assessment ?Lower Extremity Assessment: Generalized weakness ?  ?Cervical / Trunk Assessment ?Cervical / Trunk Assessment: Normal ?  ?Communication Communication ?Communication: No difficulties ?  ?Cognition Arousal/Alertness: Awake/alert ?Behavior During Therapy: [facility] for tasks assessed/performed ?Overall Cognitive Status: Within  Functional Limits for tasks assessed ?  ?  ?  ?  ?  ?  ?  ?  ?  ?  ?  ?  ?  ?  ?  ?  ?  ?  ?  ?General Comments  spouse present entire session ? ?  ?Exercises   ?  ?Shoulder Instructions    ? ? ?Home Living Family/patient expects to be discharged to:: Private residence ?Living Arrangements: Spouse/significant other;Children ?Available Help at Discharge: Family;Available PRN/intermittently ?Type of Home: House ?Home Access: Stairs to enter ?Entrance Stairs-Number of Steps: 3 ?Entrance Stairs-Rails: Right ?Home Layout: One level ?  ?  ?Bathroom Shower/Tub: Walk-in shower ?  ?Bathroom Toilet: Handicapped height ?  ?  ?Home Equipment: Conservation officer, nature (2 wheels);Cane - single point ?  ?Additional Comments: dog named [place] ?  ? ?  ?Prior Functioning/Environment Prior Level of Function : Independent/Modified Independent ?  ?  ?  ?  ?  ?  ?Mobility Comments: Uses SPC for ambulation and in the last week has been using RW. ?ADLs Comments: independent, does some IADLs as well, drives. ?  ? ?  ?  ?OT Problem List:   ?  ?   ?OT Treatment/Interventions:    ?  ?OT Goals(Current goals can be found in the care plan section) Acute Rehab OT Goals ?Patient Stated Goal: none stated ?OT Goal Formulation: With patient/family ?Time For Goal Achievement: 08/20/21 ?Potential to Achieve Goals: Good  ?OT Frequency:   ?  ? ?Co-evaluation   ?  ?  ?  ?  ? ?  ?AM-PAC OT "6 Clicks" Daily Activity     ?Outcome Measure   ?  ?  ?  ?  ?  ?  ?  ?End of Session   ? ?Activity Tolerance:   ?Patient left:   ? ?   ?              ?Time: 8916-9450 ?OT Time Calculation (min): 26 min ?Charges:  OT General Charges ?$OT Visit: 1 Visit ?OT Evaluation ?$OT Eval Moderate Complexity: 1 Mod ? ? ?[place], OTR/L  ?Acute Rehabilitation Services ?Pager: [phone] ?Office: [phone] ?. ? ? ?Jeri Modena ?08/06/2021, 3:16 PM ?

## 2021-08-06 NOTE — Assessment & Plan Note (Addendum)
Calculated BMI is 54,5 ? ?

## 2021-08-06 NOTE — Progress Notes (Addendum)
?Progress Note ? ? ?Patient: [name] IWO:[id number] DOB: 24-Nov-1950 DOA: 08/05/2021     1 ?DOS: the patient was seen and examined on 08/06/2021 ?  ?Brief hospital course: ?[name] was admitted to the hospital with the working diagnosis of decompensated heart failure.  ? ?71 yo female with the past medical history of right breast cancer sp mastectomy, chemotherapy and radiation, atrial fibrillation, hypertension, obesity class 3 and dyslipidemia who presented with dyspnea. Reported worsening dyspnea for 7 days, associated with orthopnea and lower extremity edema. Positive diarrhea, nausea and vomiting. Stop taking her diuretic therapy to avoid polyuria. On her initial physical examination her blood pressure was 169/120, HR 85, RR 23 and 02 saturation 98% on supplemental 02 per [place]. Her lungs had no wheezing or rhonchi, heart with S1 and S2 present and rhythmic, no gallops or murmurs, abdomen soft and no lower extremity edema.  ? ?Na 134, K 3,4 CL 105, bicarbonate 26, glucose 105, bun 10, cr 1,13 ?AST 19 ALT 13 total bilirubin 5,6  ?BNP 630 ?Wbc 6,3 hgb 12.7 hct 41,3 plt 152 ?Sars covid 19 negative  ? ?Chest radiograph with cardiomegaly, bilateral interstitial infiltrates at the lowe lobes.  ? ?EKG 93 bpm, right axis, normal qtc, atrial fibrillation rhythm, with no significant ST segment and T wave changes.  ? ?Patient has been placed on furosemide for diuresis.  ? ? ?Assessment and Plan: ?* CHF (congestive heart failure) ([name]) ?Patient continue to be hypervolemic with significant lower extremity edema and positive JVD ? ?Documented urine output 2,650 ml.  ? ?Echocardiogram with LV EF 65 to 70% with moderate left ventricular hypertrophy. Interventricular septum is flattened in diastole ( D shaped left ventricle) consistent with right ventricular volume or pressure overload. Mild reduction in RV systolic function. Moderate elevation in pulmonary artery systolic pressure at 50.0 mmHg. Severe dilatation of  bilateral atriums. Moderate MR and severe TR  ? ?Acute on chronic core pulmonale.  ? ?Plan to continue aggressive diuresis with furosemide, increase dose to 60 mg IV q12 hrs to target further negative fluid balance.  ?Add spironolactone and dapagliflozin  ? ?Acute respiratory failure with hypoxia ([name]) ?Continue diuresis for acute cardiogenic pulmonary edema. ?Her oxygenation is 98% on 2 L/min per [place] ?Continue oxymetry and supplemental 02 per [place] target 02 saturation 92% or greater.  ? ?Hyperbilirubinemia ?-Chronically elevated since 2013 with baseline around 2 but today elevated to 5.6 without abdominal pain or jaundice.   ? ?Her follow up bilirubin is trending down to 4.5 with predominant direct 3,8 and indirect 0,8.  ?Likely congestive hepatopathy, possible biliary congestion due to decompensated heart failure.  ? ?US liver in 2021 with no focal lesions, post cholecystectomy with no biliary duct dilatation.  ? ?Plan to continue close follow up. Patient with no abdominal pain, no further nausea and vomiting.  ? ?Chronic atrial fibrillation ([name]) ?Reate controlled atrial fibrillation, continue anticoagulation with rivaroxaban.  ? ?Essential hypertension ?Blood pressure with systolic 370 to 488 mmHg. ?Blood pressure control with amlodipine, losartan and metoprolol.  ?Continue aggressive diuresis with furosemide. ?Continue close monitoring of blood pressure.  ? ?Diarrhea ?Clinically resolved, discontinue contact isolation  ? ?Hypokalemia ?Renal function with serum cr at 1,0 with K at 3,3 and serum bicarbonate at 28, ?Na 142. ? ?Continue KCl, 40 meq x2 today to correct hypokalemia, follow renal function and electrolytes in am.  ?Continue diuresis.  ? ?Obstructive sleep apnea ?Cpap at night  ? ?Dyslipidemia ?Continue with statin therapy.  ? ?Pituitary macroadenoma ([name]) ?  Continue cabergoline. Follows with neurosurgery Dr. Christella Noa. ? ?Breast cancer of lower-inner quadrant of right female breast ([name]) ?- Has right  mastectomy s/p chemo, radiation and antiestrogen therapy in remission ?-follows with Dr. Burr Medico oncology ? ?Class 3 obesity (HCC) ?Calculated BMI is 54,5 ? ? ? ? ? ?  ? ?Subjective: patient with persistent edema and mild improvement in dyspnea, no further diarrhea, nausea or vomiting  ? ?Physical Exam: ?Vitals:  ? 08/06/21 0122 08/06/21 0453 08/06/21 0909 08/06/21 1223  ?BP:  133/85 111/66 (!) 137/121  ?Pulse: 84 86  89  ?Resp: 20 (!) 22 (!) 25   ?Temp:  98.4 ?F (36.9 ?C) 98.7 ?F (37.1 ?C) 97.7 ?F (36.5 ?C)  ?TempSrc:  Oral Oral Oral  ?SpO2: 97% 98%  98%  ?Weight:      ?Height:      ? ?Neurology awake and alert ?ENT with no pallor ?Cardiovascular with S1 and S2 present, irregularly irregular with no gallops or rubs, positive systolic murmur a the right sternal border ?Positive JVD ?Positive lower extremity edema +++ ?Respiratory with rales but not wheezing, no rhonchi ?Abdomen protuberant but not distended  ?Data Reviewed: ? ? ? ?Family Communication: no family at the bedside  ? ?Disposition: ?Status is: Inpatient ?Remains inpatient appropriate because: heart failure  ? Planned Discharge Destination: Home ? ? ? ?Author: ?Tawni Millers, MD ?08/06/2021 1:29 PM ? ?For on call review [email or web address].  ?

## 2021-08-06 NOTE — Hospital Course (Addendum)
[name] was admitted to the hospital with the working diagnosis of decompensated heart failure.  ? ?71 yo female with the past medical history of right breast cancer sp mastectomy, chemotherapy and radiation, atrial fibrillation, hypertension, obesity class 3 and dyslipidemia who presented with dyspnea. Reported worsening dyspnea for 7 days, associated with orthopnea and lower extremity edema. Positive diarrhea, nausea and vomiting. Stop taking her diuretic therapy to avoid polyuria. On her initial physical examination her blood pressure was 169/120, HR 85, RR 23 and 02 saturation 98% on supplemental 02 per [place]. Her lungs had no wheezing or rhonchi, heart with S1 and S2 present and rhythmic, no gallops or murmurs, abdomen soft and no lower extremity edema.  ? ?Na 134, K 3,4 CL 105, bicarbonate 26, glucose 105, bun 10, cr 1,13 ?AST 19 ALT 13 total bilirubin 5,6  ?BNP 630 ?Wbc 6,3 hgb 12.7 hct 41,3 plt 152 ?Sars covid 19 negative  ? ?Chest radiograph with cardiomegaly, bilateral interstitial infiltrates at the lowe lobes.  ? ?EKG 93 bpm, right axis, normal qtc, atrial fibrillation rhythm, with no significant ST segment and T wave changes.  ? ?Patient has been placed on furosemide for diuresis.  ?

## 2021-08-06 NOTE — Progress Notes (Signed)
?  08/06/21 1305  ?Clinical Encounter Type  ?Visited With Patient and family together ?(Husband, Mr. Armen Pickup. [place])  ?Visit Type Initial ?(Advance Directive Education)  ?Referral From Nurse ?Cicero Duck, RN)  ?Consult/Referral To Chaplain  ? ?Chaplain Melvenia Beam met with Mrs. [name] and her husband, Lupe Carney. [place], at Patient's bedside regarding the completion of Advance Directive. Patient acknowledged that preparing A.D. was something important that she would like to have completed. Chaplain explained that A.D. is only used when the patient cannot make medical decisions about medical care for herself, giving instruction to the doctors and her agents of how she wishes to be cared for.  ? ?Chaplain explained the importance of having conversation with her family prior to naming them as her agent(s).   ? ?Chaplain provided patient a blank copy of the H.C.P.O.A. and Living Will and provided patient instruction for the completion of Parts A and B of the A.D. Chaplain instructed patient to have the nurse contact [place] Department when she has completed Parts A and B, so that we can arrange for Notary and witnesses to assist with completion of Part C.   ? ? Patient was appreciative of the consultation. Chaplain will follow up with patient to arrange for witnesses and Notary assistance. [place], M. [place]. may be contacted at [phone] for further assistance.  ?

## 2021-08-06 NOTE — Assessment & Plan Note (Signed)
Continue with statin therapy.  ?

## 2021-08-06 NOTE — Progress Notes (Signed)
CPAP on bedside table. RN will assist patient with CPAP when pt is ready. RT available if assistance is needed. RT will monitor as needed. ?

## 2021-08-07 DIAGNOSIS — I509 Heart failure, unspecified: Secondary | ICD-10-CM | POA: Diagnosis not present

## 2021-08-07 LAB — BASIC METABOLIC PANEL
Anion gap: 12 (ref 5–15)
BUN: 16 mg/dL (ref 8–23)
CO2: 27 mmol/L (ref 22–32)
Calcium: 8.6 mg/dL — ABNORMAL LOW (ref 8.9–10.3)
Chloride: 100 mmol/L (ref 98–111)
Creatinine, Ser: 1.16 mg/dL — ABNORMAL HIGH (ref 0.44–1.00)
GFR, Estimated: 50 mL/min — ABNORMAL LOW (ref >60)
Glucose, Bld: 93 mg/dL (ref 70–99)
Potassium: 4.6 mmol/L (ref 3.5–5.1)
Sodium: 139 mmol/L (ref 135–145)

## 2021-08-07 LAB — MAGNESIUM: Magnesium: 1.7 mg/dL (ref 1.7–2.4)

## 2021-08-07 MED ORDER — HYDROCERIN EX CREA
TOPICAL_CREAM | Freq: Every day | CUTANEOUS | Status: AC
  Filled 2021-08-07 (×2): qty 113

## 2021-08-07 MED ORDER — LOSARTAN POTASSIUM 50 MG PO TABS: 50.0000 mg | ORAL_TABLET | Freq: Every day | ORAL | Status: DC

## 2021-08-07 NOTE — Progress Notes (Signed)
Physical Therapy Treatment ?Patient Details ?Name: [name] ?MRN: [id number] ?DOB: 1950-10-22 ?Today's Date: 08/07/2021 ? ? ?History of Present Illness Patient is a 71 y/o female who presents on 08/05/21 with SOB, diarrhea, nausea. CXR- bil pulmonary opacities likely pulmonary edema. Admitted with acute respiratory failure due to decompensated heart failure. PMH includes breast ca s/p mastectomy, chemotherapy and radiation, A-fib. HTN. ? ?  ?PT Comments  ? ? The pt was able to demo good progress and safe use of rollator. She continues to have slight desat to SpO2 of 84-85% on RA with gait, but recovered well with seated rest and 1L O2. The pt was educated on safe use of rollator for energy conservation with prolonged ambulation, will continue to benefit from skilled PT intervention to wean O2 with gait and improve endurance. The pt completed x3 bouts of ambulation (~50-60 ft each) but requires seated rest due to RR of >35 and 3/4 DOE. ?  ?Recommendations for follow up therapy are one component of a multi-disciplinary discharge planning process, led by the attending physician.  Recommendations may be updated based on patient status, additional functional criteria and insurance authorization. ? ?Follow Up Recommendations ? Home health PT ?  ?  ?Assistance Recommended at Discharge Intermittent Supervision/Assistance  ?Patient can return home with the following A little help with walking and/or transfers;A little help with bathing/dressing/bathroom;Assistance with cooking/housework;Help with stairs or ramp for entrance ?  ?Equipment Recommendations ? Rollator (4 wheels)  ?  ?Recommendations for Other Services   ? ? ?  ?Precautions / Restrictions Precautions ?Precautions: Fall;Other (comment) (O2 needs) ?Precaution Comments: desats with activity, 1L today for gait ?Restrictions ?Weight Bearing Restrictions: No  ?  ? ?Mobility ? Bed Mobility ?Overal bed mobility: Needs Assistance ?Bed Mobility: Supine to Sit ?  ?   ?Supine to sit: Min assist ?  ?  ?General bed mobility comments: minA with HOB elevated. pt needing increased time ?  ? ?Transfers ?Overall transfer level: Needs assistance ?Equipment used: Rollator (4 wheels) ?Transfers: Sit to/from Stand ?Sit to Stand: Min guard ?  ?  ?  ?  ?  ?General transfer comment: minG to complete with cues for hand placement on bed ?  ? ?Ambulation/Gait ?Ambulation/Gait assistance: Min guard ?Gait Distance (Feet): 60 Feet (+ 54 ft + 60 ft) ?Assistive device: Rolling walker (2 wheels) ?Gait Pattern/deviations: Step-to pattern, Decreased stride length, Shuffle, Trunk flexed ?Gait velocity: decreased ?Gait velocity interpretation: <1.31 ft/sec, indicative of household ambulator ?  ?General Gait Details: pt with slow, wadding gait cues for self-monitoring exertion and sitting when fatigued. SpO2 to low of 84% on RA with gait, increased to 90s with 1L or 1 min seated rest ? ? ? ? ?  ?Balance Overall balance assessment: Needs assistance ?Sitting-balance support: Feet supported, No upper extremity supported ?Sitting balance-Leahy Scale: Fair ?  ?  ?Standing balance support: Single extremity supported, During functional activity ?Standing balance-Leahy Scale: Fair ?  ?  ?  ?  ?  ?  ?  ?  ?  ?  ?  ?  ?  ? ?  ?Cognition Arousal/Alertness: Awake/alert ?Behavior During Therapy: [facility] for tasks assessed/performed ?Overall Cognitive Status: Within Functional Limits for tasks assessed ?  ?  ?  ?  ?  ?  ?  ?  ?  ?  ?  ?  ?  ?  ?  ?  ?General Comments: pt eager to mobilize, able to follow all cues with rollator. increased time ?  ?  ? ?  ?  Exercises   ? ?  ?General Comments General comments (skin integrity, edema, etc.): VSS on RA at rest, 1L with gait ?  ?  ? ?Pertinent Vitals/Pain Pain Assessment ?Pain Assessment: No/denies pain  ? ? ? ?PT Goals (current goals can now be found in the care plan section) Acute Rehab PT Goals ?Patient Stated Goal: be able to breathe ?PT Goal Formulation: With patient ?Time For  Goal Achievement: 08/20/21 ?Potential to Achieve Goals: Fair ?Progress towards PT goals: Progressing toward goals ? ?  ?Frequency ? ? ? Min 3X/week ? ? ? ?  ?PT Plan Current plan remains appropriate  ? ? ?   ?AM-PAC PT "6 Clicks" Mobility   ?Outcome Measure ? Help needed turning from your back to your side while in a flat bed without using bedrails?: A Little ?Help needed moving from lying on your back to sitting on the side of a flat bed without using bedrails?: A Little ?Help needed moving to and from a bed to a chair (including a wheelchair)?: A Little ?Help needed standing up from a chair using your arms (e.g., wheelchair or bedside chair)?: A Little ?Help needed to walk in hospital room?: A Little ?Help needed climbing 3-5 steps with a railing? : A Little ?6 Click Score: 18 ? ?  ?End of Session Equipment Utilized During Treatment: Oxygen;Gait belt ?Activity Tolerance: Treatment limited secondary to medical complications (Comment) (drop in Sp02) ?Patient left: in chair;with call bell/phone within reach ?Nurse Communication: Mobility status;Other (comment) (drop in Sp02) ?PT Visit Diagnosis: Other (comment);Muscle weakness (generalized) (M62.81) (DOE) ?  ? ? ?Time: 0950-1030 ?PT Time Calculation (min) (ACUTE ONLY): 40 min ? ?Charges:  $Therapeutic Exercise: 23-37 mins ?$Therapeutic Activity: 8-22 mins          ?          ? ?West Carbo, PT, DPT  ? ?Acute Rehabilitation Department ?Pager #: [phone] - [phone] ? ? ?Sandra Cockayne ?08/07/2021, 10:44 AM ? ?

## 2021-08-07 NOTE — Progress Notes (Signed)
Occupational Therapy Treatment ?Patient Details ?Name: [name] ?MRN: [id number] ?DOB: February 23, 1951 ?Today's Date: 08/07/2021 ? ? ?History of present illness Patient is a 71 y/o female who presents on 08/05/21 with SOB, diarrhea, nausea. CXR- bil pulmonary opacities likely pulmonary edema. Admitted with acute respiratory failure due to decompensated heart failure. PMH includes breast ca s/p mastectomy, chemotherapy and radiation, A-fib. HTN. ?  ?OT comments ? Pt progressed to [facility] this session with new bed linens due to incontinence with pure wick. Pt unaware of leaking pure wick. Pt encouraged to continue to get up to [facility] during the day. Pt with urgency due to the lasix. Recommendation for HHOT . Pt with good return verbalization of EC.   ? ?Recommendations for follow up therapy are one component of a multi-disciplinary discharge planning process, led by the attending physician.  Recommendations may be updated based on patient status, additional functional criteria and insurance authorization. ?   ?Follow Up Recommendations ? Home health OT  ?  ?Assistance Recommended at Discharge PRN  ?Patient can return home with the following ? A little help with walking and/or transfers;A little help with bathing/dressing/bathroom ?  ?Equipment Recommendations ? Tub/shower seat;Other (comment)  ?  ?Recommendations for Other Services   ? ?  ?Precautions / Restrictions Precautions ?Precautions: Fall ?Precaution Comments: watch O2  ? ? ?  ? ?Mobility Bed Mobility ?Overal bed mobility: Needs Assistance ?Bed Mobility: Supine to Sit ?  ?  ?Supine to sit: Min assist, HOB elevated ?  ?  ?General bed mobility comments: pt requires cues for bed rail use and sequence. pt holding breath with task ?  ? ?Transfers ?Overall transfer level: Needs assistance ?  ?Transfers: Sit to/from Stand ?Sit to Stand: Min assist ?  ?  ?  ?  ?  ?  ?  ?  ?Balance Overall balance assessment: Needs assistance ?Sitting-balance support: Bilateral upper extremity  supported, Feet supported ?Sitting balance-Leahy Scale: Fair ?  ?  ?Standing balance support: Bilateral upper extremity supported, During functional activity ?Standing balance-Leahy Scale: Poor ?Standing balance comment: requires OT and used use RW ?  ?  ?  ?  ?  ?  ?  ?  ?  ?  ?  ?   ?      ?ADL either performed or assessed with clinical judgement  ? ?ADL Overall ADL's : Needs assistance/impaired ?  ?  ?  ?  ?  ?  ?  ?  ?  ?  ?  ?  ?Toilet Transfer: Minimal assistance;BSC/3in1;Ambulation ?Toilet Transfer Details (indicate cue type and reason): pt taking 4 steps to commode from Bed. pt unsteady adn could use a RW. O2 sats remained 88 to 100 with transfer ?  ?  ?  ?  ?  ?General ADL Comments: pt with incontinence on arrival due to purewick suction off. pt with new linen placed on bed and transfered to [facility] for hygiene/ void bladder / new gown. ?  ? ?Extremity/Trunk Assessment Upper Extremity Assessment ?Upper Extremity Assessment: Overall WFL for tasks assessed ?  ?Lower Extremity Assessment ?Lower Extremity Assessment: Generalized weakness (keeps legs very wide base of support) ?  ?  ?  ? ?Vision   ?Additional Comments: wears glasses ?  ?Perception   ?  ?Praxis   ?  ? ?Cognition Arousal/Alertness: Awake/alert ?Behavior During Therapy: [facility] for tasks assessed/performed ?Overall Cognitive Status: Within Functional Limits for tasks assessed ?  ?  ?  ?  ?  ?  ?  ?  ?  ?  ?  ?  ?  ?  ?  ?  ?  General Comments: good recall of EC verbally ?  ?  ?   ?Exercises   ? ?  ?Shoulder Instructions   ? ? ?  ?General Comments RA during session 90% or better. pt demonstrates decreased sats to 77% with supine bed positioning. pt requires [place] 2L supine  ? ? ?Pertinent Vitals/ Pain       Pain Assessment ?Pain Assessment: No/denies pain ? ?Home Living   ?  ?  ?  ?  ?  ?  ?  ?  ?  ?  ?  ?  ?  ?  ?  ?  ?  ?  ? ?  ?Prior Functioning/Environment    ?  ?  ?  ?   ? ?Frequency ? Min 2X/week  ? ? ? ? ?  ?Progress Toward Goals ? ?OT Goals(current goals  can now be found in the care plan section) ? Progress towards OT goals: Progressing toward goals ? ?Acute Rehab OT Goals ?Patient Stated Goal: to use a chair in the bathroom ?OT Goal Formulation: With patient/family ?Time For Goal Achievement: 08/20/21 ?Potential to Achieve Goals: Good ?ADL Goals ?Pt Will Perform Lower Body Dressing: with modified independence;with adaptive equipment;sit to/from stand ?Pt Will Transfer to Toilet: with modified independence;ambulating ?Additional ADL Goal #1: pt will demonstrate 2 energy conservation strategies during adl.  ?Plan Discharge plan remains appropriate   ? ?Co-evaluation ? ? ?   ?  ?  ?  ?  ? ?  ?AM-PAC OT "6 Clicks" Daily Activity     ?Outcome Measure ? ? Help from another person eating meals?: None ?Help from another person taking care of personal grooming?: None ?Help from another person toileting, which includes using toliet, bedpan, or urinal?: A Little ?Help from another person bathing (including washing, rinsing, drying)?: A Little ?Help from another person to put on and taking off regular upper body clothing?: A Little ?Help from another person to put on and taking off regular lower body clothing?: A Lot ?6 Click Score: 19 ? ?  ?End of Session Equipment Utilized During Treatment: Oxygen ? ?OT Visit Diagnosis: Unsteadiness on feet (R26.81);Muscle weakness (generalized) (M62.81) ?  ?Activity Tolerance Patient tolerated treatment well ?  ?Patient Left in bed;with call bell/phone within reach ?  ?Nurse Communication Mobility status;Precautions ?  ? ?   ? ?Time: 1191-4782 ?OT Time Calculation (min): 19 min ? ?Charges: OT General Charges ?$OT Visit: 1 Visit ?OT Treatments ?$Self Care/Home Management : 8-22 mins ? ? ?Brynn, OTR/L  ?Acute Rehabilitation Services ?Pager: [phone] ?Office: [phone] ?. ?      ? ?Jeri Modena ?08/07/2021, 4:18 PM ? ? ?

## 2021-08-07 NOTE — Progress Notes (Signed)
?PROGRESS NOTE ? ? ? ?[name]  URK:[id number] DOB: [birth year] DOA: 08/05/2021 ?PCP: Stephens Shire, MD  ?Narrative 70/F with history of right breast cancer status postmastectomy, chemo, XRT, atrial fibrillation, hypertension, obesity, dyslipidemia presented to the ED with dyspnea orthopnea and lower extremity edema ? ? ?Subjective: Breathing is starting to improve ? ?Assessment and Plan: ? ?Acute diastolic CHF  ?-Patient presented with volume overload, dyspnea orthopnea and edema ?-Echo with preserved EF, 65-70%, moderate LVH, mild reduction in RV systolic function, moderate PAH, moderate MR and severe TR ?-Improving with diuresis, she is 3.6 L negative ?-Continue current dose of IV Lasix, started on Aldactone and dapagliflozin ?-Monitor urine output, kidney function closely ? ?Acute respiratory failure with hypoxia ([place]) ?-Secondary to above, also has OSA/OHS ?-Wean O2, continue nocturnal CPAP ? ?Hyperbilirubinemia ?-Chronically elevated since 2013 with baseline around 2, higher on admission, slowly improving, suspect worsened by congestive hepatomegaly ?US liver in 2021 with no focal lesions, post cholecystectomy with no biliary duct dilatation.  ?-No abdominal symptoms, will continue to trend ? ?Chronic atrial fibrillation (HCC) ?-Rate controlled, continue apixaban, metoprolol ? ?Essential hypertension ?-BP soft, hold amlodipine and losartan today ? ?Hypokalemia ?-Replaced ? ?Obstructive sleep apnea ?Cpap at night  ? ?Dyslipidemia ?Continue with statin therapy.  ? ?Pituitary macroadenoma ([place]) ?Continue cabergoline. Follows with neurosurgery Dr. Christella Noa. ? ?Breast cancer of lower-inner quadrant of right female breast ([place]) ?- Has right mastectomy s/p chemo, radiation and antiestrogen therapy in remission ?-follows with Dr. Burr Medico oncology ? ?Class 3 obesity (HCC) ?Calculated BMI is 54,5 ? ?DVT prophylaxis: Xarelto ?Code Status: Full code ?Family Communication: Discussed patient in detail, no family at  bedside ?Disposition Plan: Likely 3 to 4 days ? ? ?Procedures:  ? ?Antimicrobials:  ? ? ?Objective: ?Vitals:  ? 08/06/21 2218 08/06/21 2356 08/07/21 0152 08/07/21 8315  ?BP: 105/79 110/72 110/72 109/73  ?Pulse: 92 84 91 77  ?Resp:  20 16 16   ?Temp:  98.6 ?F (37 ?C)  98.6 ?F (37 ?C)  ?TempSrc:  Oral  Oral  ?SpO2:   96%   ?Weight:    132.9 kg  ?Height:      ? ? ?Intake/Output Summary (Last 24 hours) at 08/07/2021 1344 ?Last data filed at 08/07/2021 1310 ?Gross per 24 hour  ?Intake 240 ml  ?Output 2550 ml  ?Net -2310 ml  ? ?Filed Weights  ? 08/06/21 0012 08/07/21 0648  ?Weight: 135.2 kg 132.9 kg  ? ? ?Examination: ? ?General exam: Obese pleasant female sitting up in bed, AAOx3, no distress ?HEENT: Neck obese unable to assess JVD ?CVS: S1-S2, regular rate rhythm ?Lungs: Distant breath sounds, decreased at the bases ?Abdomen: Soft, obese, nontender, bowel sounds present ?Extremities: 2+ edema, chronic venous stasis changes, small superficial wounds  ?Psychiatry: Judgement and insight appear normal. Mood & affect appropriate.  ? ? ? ?Data Reviewed:  ? ?CBC: ?Recent Labs  ?Lab 08/05/21 ?1609  ?WBC 6.3  ?NEUTROABS 4.4  ?HGB 12.7  ?HCT 41.3  ?MCV 99.5  ?PLT 152  ? ?Basic Metabolic Panel: ?Recent Labs  ?Lab 08/05/21 ?1609 08/06/21 ?0243 08/07/21 ?0430  ?NA 143 142 139  ?K 3.5 3.3* 4.6  ?CL 105 103 100  ?CO2 26 28 27   ?GLUCOSE 105* 122* 93  ?BUN 10 12 16   ?CREATININE 1.13* 1.08* 1.16*  ?CALCIUM 9.3 9.0 8.6*  ?MG  --   --  1.7  ? ?GFR: ?Estimated Creatinine Clearance: 57.6 mL/min (A) (by C-G formula based on SCr of 1.16 mg/dL (H)). ?Liver Function  Tests: ?Recent Labs  ?Lab 08/05/21 ?1609 08/06/21 ?0243  ?AST 19  --   ?ALT 13  --   ?ALKPHOS 88  --   ?BILITOT 5.6* 4.6*  ?PROT 7.1  --   ?ALBUMIN 4.0  --   ? ?No results for input(s): LIPASE, AMYLASE in the last 168 hours. ?No results for input(s): AMMONIA in the last 168 hours. ?Coagulation Profile: ?No results for input(s): INR, PROTIME in the last 168 hours. ?Cardiac Enzymes: ?No  results for input(s): CKTOTAL, CKMB, CKMBINDEX, TROPONINI in the last 168 hours. ?BNP (last 3 results) ?No results for input(s): PROBNP in the last 8760 hours. ?HbA1C: ?No results for input(s): HGBA1C in the last 72 hours. ?CBG: ?No results for input(s): GLUCAP in the last 168 hours. ?Lipid Profile: ?No results for input(s): CHOL, HDL, LDLCALC, TRIG, CHOLHDL, LDLDIRECT in the last 72 hours. ?Thyroid Function Tests: ?No results for input(s): TSH, T4TOTAL, FREET4, T3FREE, THYROIDAB in the last 72 hours. ?Anemia Panel: ?No results for input(s): VITAMINB12, FOLATE, FERRITIN, TIBC, IRON, RETICCTPCT in the last 72 hours. ?Urine analysis: ?No results found for: COLORURINE, APPEARANCEUR, [place], [place], [place], [place], [place], KETONESUR, PROTEINUR, [place], NITRITE, LEUKOCYTESUR ?Sepsis Labs: ?@[email or web address] (procalcitonin:4,lacticidven:4) ? ?) ?Recent Results (from the past 240 hour(s))  ?Resp Panel by RT-PCR (Flu A&B, Covid) Nasopharyngeal Swab     Status: None  ? Collection Time: 08/05/21  3:04 PM  ? Specimen: Nasopharyngeal Swab; Nasopharyngeal(NP) swabs in vial transport medium  ?Result Value Ref Range Status  ? SARS Coronavirus 2 by RT PCR NEGATIVE NEGATIVE Final  ?  Comment: (NOTE) ?SARS-CoV-2 target nucleic acids are NOT DETECTED. ? ?The SARS-CoV-2 RNA is generally detectable in upper respiratory ?specimens during the acute phase of infection. The lowest ?concentration of SARS-CoV-2 viral copies this assay can detect is ?138 copies/mL. A negative result does not preclude SARS-Cov-2 ?infection and should not be used as the sole basis for treatment or ?other patient management decisions. A negative result may occur with  ?improper specimen collection/handling, submission of specimen other ?than nasopharyngeal swab, presence of viral mutation(s) within the ?areas targeted by this assay, and inadequate number of viral ?copies(<138 copies/mL). A negative result must be combined with ?clinical observations,  patient history, and epidemiological ?information. The expected result is Negative. ? ?Fact Sheet for Patients:  ?EntrepreneurPulse.com.au ? ?Fact Sheet for Healthcare Providers:  ?IncredibleEmployment.be ? ?This test is no t yet approved or cleared by the [place] FDA and  ?has been authorized for detection and/or diagnosis of SARS-CoV-2 by ?FDA under an Emergency Use Authorization (EUA). This EUA will remain  ?in effect (meaning this test can be used) for the duration of the ?COVID-19 declaration under Section 564(b)(1) of the Act, 21 ?U.S.C.section 360bbb-3(b)(1), unless the authorization is terminated  ?or revoked sooner.  ? ? ?  ? Influenza A by PCR NEGATIVE NEGATIVE Final  ? Influenza B by PCR NEGATIVE NEGATIVE Final  ?  Comment: (NOTE) ?The Xpert Xpress SARS-CoV-2/FLU/RSV plus assay is intended as an aid ?in the diagnosis of influenza from Nasopharyngeal swab specimens and ?should not be used as a sole basis for treatment. Nasal washings and ?aspirates are unacceptable for Xpert Xpress SARS-CoV-2/FLU/RSV ?testing. ? ?Fact Sheet for Patients: ?EntrepreneurPulse.com.au ? ?Fact Sheet for Healthcare Providers: ?IncredibleEmployment.be ? ?This test is not yet approved or cleared by the [place] FDA and ?has been authorized for detection and/or diagnosis of SARS-CoV-2 by ?FDA under an Emergency Use Authorization (EUA). This EUA will remain ?in effect (meaning this test can be used) for  the duration of the ?COVID-19 declaration under Section 564(b)(1) of the Act, 21 U.S.C. ?section 360bbb-3(b)(1), unless the authorization is terminated or ?revoked. ? ?Performed at [facility], [place] [address], [place], [place] ?[address] ?  ?  ? ?Radiology Studies: ?DG Chest 2 View ? ?Result Date: 08/05/2021 ?CLINICAL DATA:  Shortness of breath EXAM: CHEST - 2 VIEW COMPARISON:  03/28/2020 FINDINGS: Gross cardiomegaly. Bilateral heterogeneous and  interstitial pulmonary opacity. Disc degenerative disease of the thoracic spine. IMPRESSION: Cardiomegaly with bilateral heterogeneous and interstitial pulmonary opacity, likely edema. Electronically Signed   By: Durwin Nora

## 2021-08-07 NOTE — Progress Notes (Signed)
RT placed patient on CPAP with 2L O2 bled into circuit. Patient tolerating settings well at this time. RT will monitor as needed. 

## 2021-08-07 NOTE — Progress Notes (Signed)
Mobility Specialist Progress Note  ? ? 08/07/21 1416  ?Mobility  ?Activity Refused mobility  ? ?Pt c/o a lot of urine output.  ? ?[name] ?Mobility Specialist  ?M.S. 5N: [phone]  ?

## 2021-08-07 NOTE — Consult Note (Addendum)
WOC Nurse Consult Note: ?Reason for Consult: Consult requested for inner groin and bilat legs. ?Bilat legs and feet with generalized edema, several patchy areas of dry scabbed darker-colored skin, no open wounds or drainage requiring topical treatment.  ?Inner groin and skin folds with darker colored skin changes; area is red, moist and macerated.  Appearance is consistent with moisture associated skin damage.  ?ICD-10 CM Codes for Irritant Dermatitis ?L30.4  - Erythema intertrigo: dermatitis due to sweating and friction, genital/thigh intertrigo.  ? ?Dressing procedure/placement/frequency: Topical treatment orders provided for bedside nurses to perform as follows to promote healing:  ?1. Apply antifungal powder to inner groin/skin folds with each turning and cleaning session ?2. Apply Eucerin cream to bilat legs Q day ?Please re-consult if further assistance is needed.  Thank-you,  ?Julien Girt MSN, RN, [place], [place], CNS ?[phone]  ?  ?

## 2021-08-08 ENCOUNTER — Other Ambulatory Visit (HOSPITAL_COMMUNITY): Payer: Self-pay

## 2021-08-08 DIAGNOSIS — I509 Heart failure, unspecified: Secondary | ICD-10-CM | POA: Diagnosis not present

## 2021-08-08 DIAGNOSIS — I482 Chronic atrial fibrillation, unspecified: Secondary | ICD-10-CM | POA: Diagnosis not present

## 2021-08-08 DIAGNOSIS — J9601 Acute respiratory failure with hypoxia: Secondary | ICD-10-CM | POA: Diagnosis not present

## 2021-08-08 LAB — COMPREHENSIVE METABOLIC PANEL
ALT: 10 U/L (ref 0–44)
AST: 14 U/L — ABNORMAL LOW (ref 15–41)
Albumin: 3.2 g/dL — ABNORMAL LOW (ref 3.5–5.0)
Alkaline Phosphatase: 71 U/L (ref 38–126)
Anion gap: 8 (ref 5–15)
BUN: 14 mg/dL (ref 8–23)
CO2: 36 mmol/L — ABNORMAL HIGH (ref 22–32)
Calcium: 8.8 mg/dL — ABNORMAL LOW (ref 8.9–10.3)
Chloride: 96 mmol/L — ABNORMAL LOW (ref 98–111)
Creatinine, Ser: 1.3 mg/dL — ABNORMAL HIGH (ref 0.44–1.00)
GFR, Estimated: 44 mL/min — ABNORMAL LOW (ref >60)
Glucose, Bld: 93 mg/dL (ref 70–99)
Potassium: 3.7 mmol/L (ref 3.5–5.1)
Sodium: 140 mmol/L (ref 135–145)
Total Bilirubin: 2.5 mg/dL — ABNORMAL HIGH (ref 0.3–1.2)
Total Protein: 6 g/dL — ABNORMAL LOW (ref 6.5–8.1)

## 2021-08-08 LAB — CBC
HCT: 35.8 % — ABNORMAL LOW (ref 36.0–46.0)
Hemoglobin: 11.2 g/dL — ABNORMAL LOW (ref 12.0–15.0)
MCH: 31.3 pg (ref 26.0–34.0)
MCHC: 31.3 g/dL (ref 30.0–36.0)
MCV: 100 fL (ref 80.0–100.0)
Platelets: 171 10*3/uL (ref 150–400)
RBC: 3.58 MIL/uL — ABNORMAL LOW (ref 3.87–5.11)
RDW: 14.8 % (ref 11.5–15.5)
WBC: 5.1 10*3/uL (ref 4.0–10.5)
nRBC: 0 % (ref 0.0–0.2)

## 2021-08-08 MED ORDER — POLYVINYL ALCOHOL 1.4 % OP SOLN
1.0000 [drp] | OPHTHALMIC | Status: DC | PRN
  Filled 2021-08-08: qty 15

## 2021-08-08 MED ORDER — EMPAGLIFLOZIN 10 MG PO TABS: 10.0000 mg | ORAL_TABLET | Freq: Every day | ORAL | Status: DC

## 2021-08-08 MED ORDER — MAGNESIUM SULFATE 2 GM/50ML IV SOLN
2.0000 g | Freq: Once | INTRAVENOUS | Status: AC
  Administered 2021-08-08: 2 g via INTRAVENOUS
  Filled 2021-08-08: qty 50

## 2021-08-08 MED ORDER — METOPROLOL SUCCINATE ER 50 MG PO TB24
50.0000 mg | ORAL_TABLET | Freq: Two times a day (BID) | ORAL | Status: DC
  Administered 2021-08-08 – 2021-08-10 (×4): 50 mg via ORAL
  Filled 2021-08-08 (×4): qty 1

## 2021-08-08 MED ORDER — POTASSIUM CHLORIDE CRYS ER 20 MEQ PO TBCR
40.0000 meq | EXTENDED_RELEASE_TABLET | Freq: Once | ORAL | Status: AC
  Administered 2021-08-08: 40 meq via ORAL
  Filled 2021-08-08: qty 2

## 2021-08-08 MED ORDER — BRIMONIDINE TARTRATE 0.2 % OP SOLN
1.0000 [drp] | Freq: Three times a day (TID) | OPHTHALMIC | Status: DC
  Administered 2021-08-08 – 2021-08-09 (×5): 1 [drp] via OPHTHALMIC
  Filled 2021-08-08: qty 5

## 2021-08-08 MED ORDER — BRINZOLAMIDE 1 % OP SUSP
1.0000 [drp] | Freq: Three times a day (TID) | OPHTHALMIC | Status: DC
  Administered 2021-08-08 – 2021-08-09 (×5): 1 [drp] via OPHTHALMIC
  Filled 2021-08-08: qty 10

## 2021-08-08 NOTE — Plan of Care (Signed)
?  Problem: Activity: ?Goal: Capacity to carry out activities will improve ?Outcome: Progressing ?  ?

## 2021-08-08 NOTE — Progress Notes (Addendum)
?PROGRESS NOTE ? ? ? ?[name]  BJS:[id number] DOB: 06-28-1950 DOA: 08/05/2021 ?PCP: Stephens Shire, MD  ?Narrative 70/F with history of right breast cancer status postmastectomy, chemo, XRT, atrial fibrillation, hypertension, obesity, dyslipidemia presented to the ED with dyspnea orthopnea and lower extremity edema. ?-Further work-up noted EF of 65-70%, moderate LVH, moderate MR, severe TR ? ? ?Subjective: Feels okay overall, breathing is improving ? ?Assessment and Plan: ? ?Acute diastolic CHF  ?-Patient presented with volume overload, dyspnea orthopnea and edema ?-Echo with preserved EF, 65-70%, moderate LVH, mild reduction in RV systolic function, moderate PAH, moderate MR and severe TR, will request Cards Eval ?-Improving with diuresis, she is 6.1 L negative, weight down 13 pounds ?-Continue current dose of IV Lasix, started on Aldactone and dapagliflozin ?-Kidney function is stable, BMP in a.m. ? ?Acute respiratory failure with hypoxia ([place]) ?-Secondary to above, also has OSA/OHS ?-Wean O2, continue nocturnal CPAP ? ?Hyperbilirubinemia ?-Chronically elevated since 2013 with baseline around 2, higher on admission, slowly improving, suspect worsened by congestive hepatomegaly ?US liver in 2021 with no focal lesions, post cholecystectomy with no biliary duct dilatation.  ?-No abdominal symptoms, bilirubin trending down ? ?Chronic atrial fibrillation (HCC) ?-Rate controlled, continue apixaban, metoprolol ? ?Essential hypertension ?-BP soft, holding amlodipine and losartan ? ?Hypokalemia ?-Replaced ? ?Obstructive sleep apnea ?Cpap at night  ? ?Dyslipidemia ?Continue with statin therapy.  ? ?Pituitary macroadenoma ([place]) ?Continue cabergoline. Follows with neurosurgery Dr. Christella Noa. ? ?Breast cancer of lower-inner quadrant of right female breast ([place]) ?- Has right mastectomy s/p chemo, radiation and antiestrogen therapy in remission ?-follows with Dr. Burr Medico oncology ? ?Class 3 obesity (HCC) ?Calculated BMI is  54,5 ? ?DVT prophylaxis: Xarelto ?Code Status: Full code ?Family Communication: Discussed patient in detail, no family at bedside ?Disposition Plan: Likely 2 to 3 days ? ? ?Procedures:  ? ?Antimicrobials:  ? ? ?Objective: ?Vitals:  ? 08/07/21 2000 08/07/21 2100 08/08/21 0418 08/08/21 0837  ?BP:  111/71  114/61  ?Pulse:    82  ?Resp: 16 15    ?Temp:  98.5 ?F (36.9 ?C)    ?TempSrc:  Oral    ?SpO2:      ?Weight:   129.6 kg   ?Height:      ? ? ?Intake/Output Summary (Last 24 hours) at 08/08/2021 1335 ?Last data filed at 08/08/2021 1154 ?Gross per 24 hour  ?Intake --  ?Output 3050 ml  ?Net -3050 ml  ? ?Filed Weights  ? 08/06/21 0012 08/07/21 6073 08/08/21 0418  ?Weight: 135.2 kg 132.9 kg 129.6 kg  ? ? ?Examination: ? ?General exam: Obese pleasant female sitting up in bed, AAOx3, no distress ?HEENT: Neck obese unable to assess JVD ?CVS: S1-S2, regular rate rhythm ?Lungs: Poor air movement, decreased at the bases ?Abdomen: Soft, obese, nontender, lateral abdominal wall edema ?Extremities: 1+ edema, extending all the way up to upper thigh, chronic venous stasis changes, small superficial wounds  ?Psychiatry: Judgement and insight appear normal. Mood & affect appropriate.  ? ? ? ?Data Reviewed:  ? ?CBC: ?Recent Labs  ?Lab 08/05/21 ?1609 08/08/21 ?0337  ?WBC 6.3 5.1  ?NEUTROABS 4.4  --   ?HGB 12.7 11.2*  ?HCT 41.3 35.8*  ?MCV 99.5 100.0  ?PLT 152 171  ? ?Basic Metabolic Panel: ?Recent Labs  ?Lab 08/05/21 ?1609 08/06/21 ?0243 08/07/21 ?0430 08/08/21 ?0337  ?NA 143 142 139 140  ?K 3.5 3.3* 4.6 3.7  ?CL 105 103 100 96*  ?CO2 26 28 27  36*  ?GLUCOSE 105* 122* 93  93  ?BUN 10 12 16 14   ?CREATININE 1.13* 1.08* 1.16* 1.30*  ?CALCIUM 9.3 9.0 8.6* 8.8*  ?MG  --   --  1.7  --   ? ?GFR: ?Estimated Creatinine Clearance: 50.6 mL/min (A) (by C-G formula based on SCr of 1.3 mg/dL (H)). ?Liver Function Tests: ?Recent Labs  ?Lab 08/05/21 ?1609 08/06/21 ?0243 08/08/21 ?0337  ?AST 19  --  14*  ?ALT 13  --  10  ?ALKPHOS 88  --  71  ?BILITOT 5.6*  4.6* 2.5*  ?PROT 7.1  --  6.0*  ?ALBUMIN 4.0  --  3.2*  ? ?No results for input(s): LIPASE, AMYLASE in the last 168 hours. ?No results for input(s): AMMONIA in the last 168 hours. ?Coagulation Profile: ?No results for input(s): INR, PROTIME in the last 168 hours. ?Cardiac Enzymes: ?No results for input(s): CKTOTAL, CKMB, CKMBINDEX, TROPONINI in the last 168 hours. ?BNP (last 3 results) ?No results for input(s): PROBNP in the last 8760 hours. ?HbA1C: ?No results for input(s): HGBA1C in the last 72 hours. ?CBG: ?No results for input(s): GLUCAP in the last 168 hours. ?Lipid Profile: ?No results for input(s): CHOL, HDL, LDLCALC, TRIG, CHOLHDL, LDLDIRECT in the last 72 hours. ?Thyroid Function Tests: ?No results for input(s): TSH, T4TOTAL, FREET4, T3FREE, THYROIDAB in the last 72 hours. ?Anemia Panel: ?No results for input(s): VITAMINB12, FOLATE, FERRITIN, TIBC, IRON, RETICCTPCT in the last 72 hours. ?Urine analysis: ?No results found for: COLORURINE, APPEARANCEUR, [place], [place], [place], [place], [place], KETONESUR, PROTEINUR, [place], NITRITE, LEUKOCYTESUR ?Sepsis Labs: ?@[email or web address] (procalcitonin:4,lacticidven:4) ? ?) ?Recent Results (from the past 240 hour(s))  ?Resp Panel by RT-PCR (Flu A&B, Covid) Nasopharyngeal Swab     Status: None  ? Collection Time: 08/05/21  3:04 PM  ? Specimen: Nasopharyngeal Swab; Nasopharyngeal(NP) swabs in vial transport medium  ?Result Value Ref Range Status  ? SARS Coronavirus 2 by RT PCR NEGATIVE NEGATIVE Final  ?  Comment: (NOTE) ?SARS-CoV-2 target nucleic acids are NOT DETECTED. ? ?The SARS-CoV-2 RNA is generally detectable in upper respiratory ?specimens during the acute phase of infection. The lowest ?concentration of SARS-CoV-2 viral copies this assay can detect is ?138 copies/mL. A negative result does not preclude SARS-Cov-2 ?infection and should not be used as the sole basis for treatment or ?other patient management decisions. A negative result may occur with   ?improper specimen collection/handling, submission of specimen other ?than nasopharyngeal swab, presence of viral mutation(s) within the ?areas targeted by this assay, and inadequate number of viral ?copies(<138 copies/mL). A negative result must be combined with ?clinical observations, patient history, and epidemiological ?information. The expected result is Negative. ? ?Fact Sheet for Patients:  ?EntrepreneurPulse.com.au ? ?Fact Sheet for Healthcare Providers:  ?IncredibleEmployment.be ? ?This test is no t yet approved or cleared by the [place] FDA and  ?has been authorized for detection and/or diagnosis of SARS-CoV-2 by ?FDA under an Emergency Use Authorization (EUA). This EUA will remain  ?in effect (meaning this test can be used) for the duration of the ?COVID-19 declaration under Section 564(b)(1) of the Act, 21 ?U.S.C.section 360bbb-3(b)(1), unless the authorization is terminated  ?or revoked sooner.  ? ? ?  ? Influenza A by PCR NEGATIVE NEGATIVE Final  ? Influenza B by PCR NEGATIVE NEGATIVE Final  ?  Comment: (NOTE) ?The Xpert Xpress SARS-CoV-2/FLU/RSV plus assay is intended as an aid ?in the diagnosis of influenza from Nasopharyngeal swab specimens and ?should not be used as a sole basis for treatment. Nasal washings and ?aspirates are unacceptable for Xpert Xpress  SARS-CoV-2/FLU/RSV ?testing. ? ?Fact Sheet for Patients: ?EntrepreneurPulse.com.au ? ?Fact Sheet for Healthcare Providers: ?IncredibleEmployment.be ? ?This test is not yet approved or cleared by the [place] FDA and ?has been authorized for detection and/or diagnosis of SARS-CoV-2 by ?FDA under an Emergency Use Authorization (EUA). This EUA will remain ?in effect (meaning this test can be used) for the duration of the ?COVID-19 declaration under Section 564(b)(1) of the Act, 21 U.S.C. ?section 360bbb-3(b)(1), unless the authorization is terminated  or ?revoked. ? ?Performed at [facility], [place] [address], [place], [place] ?[address] ?  ?  ? ?Radiology Studies: ?No results found. ? ? ?Scheduled Meds: ? cabergoline  0.5 mg Oral Once per day on Tue Thu  ? chlorhexidine  15

## 2021-08-08 NOTE — Progress Notes (Signed)
Mobility Specialist Progress Note  ? ? 08/08/21 1232  ?Mobility  ?Activity Ambulated with assistance in hallway  ?Level of Assistance Contact guard assist, steadying assist  ?Assistive Device Four wheel walker  ?Distance Ambulated (ft) 240 ft ?(90+110+40)  ?Activity Response Tolerated fair  ?$Mobility charge 1 Mobility  ? ?Pt received in bed and agreeable. Had void on BSC. Took x2 seated rest breaks. Ambulated on RA but SpO2 would drop into mid 80s and recover with rest and pursed lip breathing. Monitor did alert to Vtach and Extreme Vtach. Returned to chair with call bell in reach and NT present.  ? ?Hildred Alamin ?Mobility Specialist  ?M.S. 5N: [phone]  ?

## 2021-08-08 NOTE — TOC Benefit Eligibility Note (Signed)
Patient Advocate Encounter ? ?Insurance verification completed.   ? ?The patient is currently admitted and upon discharge could be taking Jardiance 10 mg. ? ?The current 30 day co-pay is, $38.00.  ? ?The patient is currently admitted and upon discharge could be taking Farxiga 10 mg. ? ?Requires Prior Authorization ? ?The patient is insured through [employer] ([place] is the only one who takes Tricare)  ? ? ? ?Lyndel Safe, CPhT ?Pharmacy Patient Advocate Specialist ?[facility] Patient Advocate Team ?Direct Number: [phone]  Fax: [phone] ? ? ? ? ? ?  ?

## 2021-08-08 NOTE — Progress Notes (Signed)
Physical Therapy Treatment ?Patient Details ?Name: [name] ?MRN: [id number] ?DOB: 04/13/1951 ?Today's Date: 08/08/2021 ? ? ?History of Present Illness Patient is a 71 y/o female who presents on 08/05/21 with SOB, diarrhea, nausea. CXR- bil pulmonary opacities likely pulmonary edema. Admitted with acute respiratory failure due to decompensated heart failure. PMH includes breast ca s/p mastectomy, chemotherapy and radiation, A-fib. HTN. ? ?  ?PT Comments  ? ? Pt was seen for mobility in her room with no O2 initially and added it due to sats dropping to 87%.  However, with O2 needed 2L to recover more expediently and remained at that level for second walk.  Pt was off O2 when PT arrived and this may also have impacted her tolerance to move.  Follow up with her to walk and monitor her vitals, and maintain a controlled approach for speed and effort with gait as she continues to diurese.   ?Recommendations for follow up therapy are one component of a multi-disciplinary discharge planning process, led by the attending physician.  Recommendations may be updated based on patient status, additional functional criteria and insurance authorization. ? ?Follow Up Recommendations ? Home health PT ?  ?  ?Assistance Recommended at Discharge Intermittent Supervision/Assistance  ?Patient can return home with the following A little help with walking and/or transfers;A little help with bathing/dressing/bathroom;Assistance with cooking/housework;Assist for transportation;Help with stairs or ramp for entrance ?  ?Equipment Recommendations ? Rollator (4 wheels)  ?  ?Recommendations for Other Services   ? ? ?  ?Precautions / Restrictions Precautions ?Precautions: Fall ?Precaution Comments: watch O2 ?Restrictions ?Weight Bearing Restrictions: No  ?  ? ?Mobility ? Bed Mobility ?Overal bed mobility: Needs Assistance ?  ?  ?  ?  ?  ?  ?General bed mobility comments: up in chair when PT arrived ?  ? ?Transfers ?Overall transfer level: Needs  assistance ?Equipment used: Rollator (4 wheels) ?Transfers: Sit to/from Stand ?Sit to Stand: Min assist ?  ?  ?  ?  ?  ?General transfer comment: min assist but last transfer min guard ?  ? ?Ambulation/Gait ?Ambulation/Gait assistance: Min guard ?Gait Distance (Feet): 110 Feet (55 x 2) ?Assistive device: Rolling walker (2 wheels) ?Gait Pattern/deviations: Step-through pattern, Step-to pattern, Decreased stride length, Wide base of support ?Gait velocity: decreased ?Gait velocity interpretation: <1.31 ft/sec, indicative of household ambulator ?  ?General Gait Details: requires use of O2 due to drop of sats to 87% with gait on room air.  Had to control speed thereafter even on air to avoid desaturation ? ? ?Stairs ?  ?  ?  ?  ?  ? ? ?Wheelchair Mobility ?  ? ?Modified Rankin (Stroke Patients Only) ?  ? ? ?  ?Balance Overall balance assessment: Needs assistance ?Sitting-balance support: Feet supported ?Sitting balance-Leahy Scale: Fair ?  ?  ?Standing balance support: Bilateral upper extremity supported, During functional activity ?Standing balance-Leahy Scale: Poor ?Standing balance comment: support and O2 at 2L ultimately to maintain sats second gait bout at 91% ?  ?  ?  ?  ?  ?  ?  ?  ?  ?  ?  ?  ? ?  ?Cognition Arousal/Alertness: Awake/alert ?Behavior During Therapy: [facility] for tasks assessed/performed ?Overall Cognitive Status: Within Functional Limits for tasks assessed ?  ?  ?  ?  ?  ?  ?  ?  ?  ?  ?  ?  ?  ?  ?  ?  ?  ?  ?  ? ?  ?  Exercises   ? ?  ?General Comments General comments (skin integrity, edema, etc.): Pt was seen for gait and balance skills with a bit of a struggle navigating in confined space of room.  Pt is observed on telemetry to monitor HR and sats, and with 2L could keep HR controlled at 95 after second gait bout and 91% sats ?  ?  ? ?Pertinent Vitals/Pain Pain Assessment ?Pain Assessment: No/denies pain  ? ? ?Home Living   ?  ?  ?  ?  ?  ?  ?  ?  ?  ?   ?  ?Prior Function    ?  ?  ?   ? ?PT Goals  (current goals can now be found in the care plan section) Acute Rehab PT Goals ?Patient Stated Goal: be able to breathe ? ?  ?Frequency ? ? ? Min 3X/week ? ? ? ?  ?PT Plan Current plan remains appropriate  ? ? ?Co-evaluation   ?  ?  ?  ?  ? ?  ?AM-PAC PT "6 Clicks" Mobility   ?Outcome Measure ? Help needed turning from your back to your side while in a flat bed without using bedrails?: A Little ?Help needed moving from lying on your back to sitting on the side of a flat bed without using bedrails?: A Little ?Help needed moving to and from a bed to a chair (including a wheelchair)?: A Little ?Help needed standing up from a chair using your arms (e.g., wheelchair or bedside chair)?: A Little ?Help needed to walk in hospital room?: A Little ?Help needed climbing 3-5 steps with a railing? : A Little ?6 Click Score: 18 ? ?  ?End of Session Equipment Utilized During Treatment: Oxygen;Gait belt ?Activity Tolerance: Treatment limited secondary to medical complications (Comment) (O2 sat drops) ?Patient left: in chair;with call bell/phone within reach ?Nurse Communication: Mobility status;Other (comment) ?PT Visit Diagnosis: Other (comment);Muscle weakness (generalized) (M62.81) ?  ? ? ?Time: 7867-5449 ?PT Time Calculation (min) (ACUTE ONLY): 21 min ? ?Charges:  $Gait Training: 8-22 mins     ?Ramond Dial ?08/08/2021, 5:12 PM ? ?Mee Hives, PT PhD ?Acute Rehab Dept. Number: [facility] [phone] and [place] [phone] ? ? ?

## 2021-08-08 NOTE — Plan of Care (Signed)
?  Problem: Activity: ?Goal: Capacity to carry out activities will improve ?08/08/2021 2231 by Sheran Luz, RN ?Outcome: Progressing ?08/08/2021 2230 by Sheran Luz, RN ?Outcome: Progressing ?  ?

## 2021-08-08 NOTE — Progress Notes (Signed)
?   08/08/21 0930  ?Clinical Encounter Type  ?Visited With Patient  ?Visit Type Follow-up  ?Referral From  ?(Chaplain Genesis)  ?Consult/Referral To Chaplain  ? ?Chaplain visited with patient to follow-up on her condition. Chaplain was greeted and patient was positive and thankful, indicating that she is coping well and looks to be discharged next week.    ?

## 2021-08-08 NOTE — TOC Initial Note (Signed)
Transition of Care (TOC) - Initial/Assessment Note  ? ? ?Patient Details  ?Name: [name] ?MRN: [id number] ?Date of Birth: [birth year] ? ?Transition of Care [facility]) CM/SW Contact:    ?Ninfa Meeker, RN ?Phone Number: ?08/08/2021, 12:57 PM ? ?Clinical Narrative: Patient presented to hospital  with volume overload, dyspnea orthopnea and edema.                 ?Case manager spoke with patient concerning discharge needs. Discussed Home Health agency choices. Referral called to Adela Lank, [facility] Liaison. Patient states she lives home with husband. Rollator has been requested. TOC Team will continue to monitor.  ? ?Expected Discharge Plan: [place] ?Barriers to Discharge: Continued Medical Work up ? ? ?Patient Goals and CMS Choice ?  ?  ?Choice offered to / list presented to : Patient ? ?Expected Discharge Plan and Services ?Expected Discharge Plan: [place] ?  ?Discharge Planning Services: CM Consult ?Post Acute Care Choice: Home Health, Durable Medical Equipment ?Living arrangements for the past 2 months: [place] ?                ?DME Arranged: Walker rolling with seat ?DME Agency: [employer] ?Date DME Agency Contacted: 08/08/21 ?Time DME Agency Contacted: [phone] ?Representative spoke with at DME Agency: Freda Munro ?HH Arranged: PT, OT ?[place] Agency: [facility] ?Date HH Agency Contacted: 08/08/21 ?Time [place]: [phone] ?Representative spoke with at [place]: Adela Lank ? ?Prior Living Arrangements/Services ?Living arrangements for the past 2 months: [place] ?Lives with:: Spouse ?Patient language and need for interpreter reviewed:: Yes ?Do you feel safe going back to the place where you live?: Yes      ?Need for Family Participation in Patient Care: Yes (Comment) ?Care giver support system in place?: Yes (comment) ?Current home services: Home OT, Home PT ?Criminal Activity/Legal Involvement Pertinent to Current  Situation/Hospitalization: No - Comment as needed ? ?Activities of Daily Living ?Home Assistive Devices/Equipment: Blood pressure cuff, Cane (specify quad or straight), CPAP, Eyeglasses, Grab bars around toilet, Grab bars in shower, Hand-held shower hose (straight cane, high toilet) ?ADL Screening (condition at time of admission) ?Patient's cognitive ability adequate to safely complete daily activities?: Yes ?Is the patient deaf or have difficulty hearing?: No ?Does the patient have difficulty seeing, even when wearing glasses/contacts?: Yes ?Does the patient have difficulty concentrating, remembering, or making decisions?: No ?Patient able to express need for assistance with ADLs?: Yes ?Does the patient have difficulty dressing or bathing?: No ?Independently performs ADLs?: Yes (appropriate for developmental age) ?Does the patient have difficulty walking or climbing stairs?: No ?Weakness of Legs: Left ?Weakness of Arms/Hands: None ? ?Permission Sought/Granted ?  ?  ?   ? Permission granted to share info w AGENCY: Al[facility] ?   ?   ? ?Emotional Assessment ?  ?Attitude/Demeanor/Rapport: Gracious ?  ?Orientation: : Oriented to Self, Oriented to Place, Oriented to  Time, Oriented to Situation ?Alcohol / Substance Use: Not Applicable ?Psych Involvement: No (comment) ? ?Admission diagnosis:  CHF (congestive heart failure) ([place]) [I50.9] ?Hypoxia [R09.02] ?Acute congestive heart failure, unspecified heart failure type ([place]) [I50.9] ?Nausea and vomiting, unspecified vomiting type [R11.2] ?Patient Active Problem List  ? Diagnosis Date Noted  ? Class 3 obesity ([place]) 08/06/2021  ? Dyslipidemia 08/06/2021  ? Hypokalemia 08/06/2021  ? CHF (congestive heart failure) ([place]) 08/05/2021  ? Hyperbilirubinemia 08/05/2021  ? Diarrhea 08/05/2021  ? 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  Acute respiratory failure with hypoxia ([place]) 08/05/2021  ? Dyspnea 03/28/2020  ? Chronic atrial fibrillation (HCC)   ? Osteopenia 02/15/2016  ? Obesity  01/19/2014  ? Neuromuscular disorder ([place])   ? Breast cancer of lower-inner quadrant of right female breast ([place]) 07/24/2011  ? Murmur 06/17/2011  ? Mixed hyperlipidemia 06/17/2011  ? Obstructive sleep apnea 11/16/2008  ? Essential hypertension 11/16/2008  ? ?PCP:  Stephens Shire, MD ?Pharmacy:   ?[id number] - [place], [place] - [address] ?[address] ?[place] [address] ?Phone: [phone] Fax: [phone] ? ?[facility], [place] ?[address] ?[place] [address] ?Phone: [phone] Fax: [phone] ? ? ? ? ?Social Determinants of Health (SDOH) Interventions ?  ? ?Readmission Risk Interventions ?No flowsheet data found. ? ? ?

## 2021-08-08 NOTE — Progress Notes (Signed)
Heart Failure Nurse Navigator Progress Note  ? ?HFTOC appt. For post hospital visit  08/20/2021 @ 9:00 am.  ? ?Educated patient about the sign and symptoms of Heart Failure and when to call her physician. ?Daily weight ins, maintaining a low sodium diet and continued diuretics compliance. Patient acknowledges her understanding of the above topics.  ? ?Earnestine Leys, BSN, RN ?Heart Failure Nurse Navigator ?[phone]  ? ? ?

## 2021-08-09 DIAGNOSIS — J9601 Acute respiratory failure with hypoxia: Secondary | ICD-10-CM | POA: Diagnosis not present

## 2021-08-09 DIAGNOSIS — I509 Heart failure, unspecified: Secondary | ICD-10-CM | POA: Diagnosis not present

## 2021-08-09 DIAGNOSIS — I482 Chronic atrial fibrillation, unspecified: Secondary | ICD-10-CM | POA: Diagnosis not present

## 2021-08-09 LAB — BASIC METABOLIC PANEL
Anion gap: 9 (ref 5–15)
Anion gap: 12 (ref 5–15)
BUN: 17 mg/dL (ref 8–23)
BUN: 18 mg/dL (ref 8–23)
CO2: 36 mmol/L — ABNORMAL HIGH (ref 22–32)
CO2: 31 mmol/L (ref 22–32)
Calcium: 8.7 mg/dL — ABNORMAL LOW (ref 8.9–10.3)
Calcium: 8.8 mg/dL — ABNORMAL LOW (ref 8.9–10.3)
Chloride: 94 mmol/L — ABNORMAL LOW (ref 98–111)
Chloride: 95 mmol/L — ABNORMAL LOW (ref 98–111)
Creatinine, Ser: 1.43 mg/dL — ABNORMAL HIGH (ref 0.44–1.00)
Creatinine, Ser: 1.21 mg/dL — ABNORMAL HIGH (ref 0.44–1.00)
GFR, Estimated: 39 mL/min — ABNORMAL LOW (ref >60)
GFR, Estimated: 48 mL/min — ABNORMAL LOW (ref >60)
Glucose, Bld: 115 mg/dL — ABNORMAL HIGH (ref 70–99)
Glucose, Bld: 119 mg/dL — ABNORMAL HIGH (ref 70–99)
Potassium: 3.6 mmol/L (ref 3.5–5.1)
Potassium: 4.9 mmol/L (ref 3.5–5.1)
Sodium: 139 mmol/L (ref 135–145)
Sodium: 138 mmol/L (ref 135–145)

## 2021-08-09 LAB — MAGNESIUM: Magnesium: 1.9 mg/dL (ref 1.7–2.4)

## 2021-08-09 MED ORDER — POLYETHYLENE GLYCOL 3350 17 G PO PACK: 17.0000 g | PACK | Freq: Every day | ORAL | Status: DC

## 2021-08-09 MED ORDER — SENNOSIDES-DOCUSATE SODIUM 8.6-50 MG PO TABS
1.0000 | ORAL_TABLET | Freq: Two times a day (BID) | ORAL | Status: DC
  Administered 2021-08-09 – 2021-08-10 (×2): 1 via ORAL
  Filled 2021-08-09 (×2): qty 1

## 2021-08-09 MED ORDER — POTASSIUM CHLORIDE CRYS ER 20 MEQ PO TBCR: 40.0000 meq | EXTENDED_RELEASE_TABLET | Freq: Once | ORAL | Status: DC

## 2021-08-09 MED ORDER — MIDODRINE HCL 5 MG PO TABS
5.0000 mg | ORAL_TABLET | Freq: Two times a day (BID) | ORAL | Status: DC
Start: 2021-08-09 — End: 2021-08-09
  Administered 2021-08-09: 5 mg via ORAL
  Filled 2021-08-09: qty 1

## 2021-08-09 NOTE — Progress Notes (Signed)
Mobility Specialist Progress Note  ? ? 08/09/21 1034  ?Mobility  ?Activity Ambulated independently in hallway  ?Level of Assistance Modified independent, requires aide device or extra time  ?Assistive Device Four wheel walker  ?Distance Ambulated (ft) 250 ft  ?Activity Response Tolerated well  ?$Mobility charge 1 Mobility  ? ?Post-Mobility: 95 HR, 113/66 BP, 95% SpO2 ? ?Pt received in bed and agreeable. Ambulated on RA. Encouraged pursed lip breathing. Returned to chair with call bell in reach.  ? ?[name] ?Mobility Specialist  ?M.S. 5N: [id number]  ?

## 2021-08-09 NOTE — Plan of Care (Signed)
?  Problem: Activity: ?Goal: Capacity to carry out activities will improve ?Outcome: Progressing ?  ?

## 2021-08-09 NOTE — Progress Notes (Signed)
?PROGRESS NOTE ? ? ? ?ANYSA TACEY  XNA:[id number] DOB: 02/03/1951 DOA: 08/05/2021 ?PCP: Stephens Shire, MD  ?Narrative 70/F with history of right breast cancer status postmastectomy, chemo, XRT, atrial fibrillation, hypertension, obesity, dyslipidemia presented to the ED with dyspnea orthopnea and lower extremity edema. ?-Further work-up noted EF of 65-70%, moderate LVH, moderate MR, severe TR ? ? ?Subjective: Feels okay overall, breathing is improving ? ?Assessment and Plan: ? ?Acute diastolic CHF  ?Right heart failure, MR, Severe TR ?-Patient presented with volume overload, dyspnea orthopnea and edema ?-Echo with preserved EF, 65-70%, moderate LVH, mild reduction in RV systolic function, moderate PAH, moderate MR and severe TR, will request Cards input ?-Has been diuresing well, she is 9.8 L negative, weight down 9 kg ?-Creatinine bumped to 1.43, will back off on diuretics today, hold Aldactone too ?-Monitor I's/O, BMP in a.m. ? ?Acute respiratory failure with hypoxia ([place]) ?-Secondary to above, also has OSA/OHS ?-Weaned off O2 today, continue nocturnal CPAP ? ?Acute kidney injury ?-Likely secondary to soft, low blood pressures overnight, dropped to 90s at one-point ?-Add midodrine, hold diuretics today, avoid hypotension ? ?Hyperbilirubinemia ?-Chronically elevated since 2013 with baseline around 2, higher on admission, slowly improving, suspect worsened by congestive hepatomegaly ?US liver in 2021 with no focal lesions, post cholecystectomy with no biliary duct dilatation.  ?-No abdominal symptoms, bilirubin trending down ? ?Chronic atrial fibrillation (HCC) ?-Rate controlled, continue apixaban, metoprolol ? ?Essential hypertension ?-BP soft, holding amlodipine and losartan, see discussion above ? ?Hypokalemia ?-Replaced ? ?Obstructive sleep apnea ?Cpap at night  ? ?Dyslipidemia ?Continue with statin therapy.  ? ?Pituitary macroadenoma ([place]) ?Continue cabergoline. Follows with neurosurgery Dr.  Christella Noa. ? ?Breast cancer of lower-inner quadrant of right female breast ([place]) ?- Has right mastectomy s/p chemo, radiation and antiestrogen therapy in remission ?-follows with Dr. Burr Medico oncology ? ?Class 3 obesity (HCC) ?Calculated BMI is 54,5 ? ?DVT prophylaxis: Xarelto ?Code Status: Full code ?Family Communication: Discussed patient in detail, no family at bedside ?Disposition Plan: Likely 2 to 3 days ? ? ?Procedures:  ? ?Antimicrobials:  ? ? ?Objective: ?Vitals:  ? 08/08/21 2127 08/09/21 0500 08/09/21 0618 08/09/21 5427  ?BP: (!) 89/57  118/67 118/67  ?Pulse: 88  74 84  ?Resp:   18   ?Temp:   98.3 ?F (36.8 ?C)   ?TempSrc:   Oral   ?SpO2:   95%   ?Weight:  126.3 kg    ?Height:      ? ? ?Intake/Output Summary (Last 24 hours) at 08/09/2021 1154 ?Last data filed at 08/09/2021 0400 ?Gross per 24 hour  ?Intake 250 ml  ?Output 1900 ml  ?Net -1650 ml  ? ?Filed Weights  ? 08/07/21 0648 08/08/21 0418 08/09/21 0500  ?Weight: 132.9 kg 129.6 kg 126.3 kg  ? ? ?Examination: ? ?General exam: Obese pleasant female sitting up in bed, AAOx3, no distress ?HEENT: Neck obese unable to assess JVD ?CVS: S1-S2, regular rate rhythm ?Lungs: Poor air movement, decreased at the bases ?Abdomen: Soft, obese, not tender, lateral abdominal wall edema ?Extremities: 1+ edema, extent especially in lateral upper thigh and flank, chronic venous stasis changes, small superficial wounds  ?Psychiatry: Judgement and insight appear normal. Mood & affect appropriate.  ? ? ? ?Data Reviewed:  ? ?CBC: ?Recent Labs  ?Lab 08/05/21 ?1609 08/08/21 ?0337  ?WBC 6.3 5.1  ?NEUTROABS 4.4  --   ?HGB 12.7 11.2*  ?HCT 41.3 35.8*  ?MCV 99.5 100.0  ?PLT 152 171  ? ?Basic Metabolic Panel: ?Recent Labs  ?  Lab 08/05/21 ?1609 08/06/21 ?0243 08/07/21 ?0430 08/08/21 ?1638 08/09/21 ?4665  ?NA 143 142 139 140 139  ?K 3.5 3.3* 4.6 3.7 3.6  ?CL 105 103 100 96* 94*  ?CO2 26 28 27  36* 36*  ?GLUCOSE 105* 122* 93 93 115*  ?BUN 10 12 16 14 17   ?CREATININE 1.13* 1.08* 1.16* 1.30* 1.43*   ?CALCIUM 9.3 9.0 8.6* 8.8* 8.7*  ?MG  --   --  1.7  --  1.9  ? ?GFR: ?Estimated Creatinine Clearance: 45.2 mL/min (A) (by C-G formula based on SCr of 1.43 mg/dL (H)). ?Liver Function Tests: ?Recent Labs  ?Lab 08/05/21 ?1609 08/06/21 ?0243 08/08/21 ?0337  ?AST 19  --  14*  ?ALT 13  --  10  ?ALKPHOS 88  --  71  ?BILITOT 5.6* 4.6* 2.5*  ?PROT 7.1  --  6.0*  ?ALBUMIN 4.0  --  3.2*  ? ?No results for input(s): LIPASE, AMYLASE in the last 168 hours. ?No results for input(s): AMMONIA in the last 168 hours. ?Coagulation Profile: ?No results for input(s): INR, PROTIME in the last 168 hours. ?Cardiac Enzymes: ?No results for input(s): CKTOTAL, CKMB, CKMBINDEX, TROPONINI in the last 168 hours. ?BNP (last 3 results) ?No results for input(s): PROBNP in the last 8760 hours. ?HbA1C: ?No results for input(s): HGBA1C in the last 72 hours. ?CBG: ?No results for input(s): GLUCAP in the last 168 hours. ?Lipid Profile: ?No results for input(s): CHOL, HDL, LDLCALC, TRIG, CHOLHDL, LDLDIRECT in the last 72 hours. ?Thyroid Function Tests: ?No results for input(s): TSH, T4TOTAL, FREET4, T3FREE, THYROIDAB in the last 72 hours. ?Anemia Panel: ?No results for input(s): VITAMINB12, FOLATE, FERRITIN, TIBC, IRON, RETICCTPCT in the last 72 hours. ?Urine analysis: ?No results found for: COLORURINE, APPEARANCEUR, [place], [place], [place], [place], [place], KETONESUR, PROTEINUR, [place], NITRITE, LEUKOCYTESUR ?Sepsis Labs: ?@LABRCNTIP (procalcitonin:4,lacticidven:4) ? ?) ?Recent Results (from the past 240 hour(s))  ?Resp Panel by RT-PCR (Flu A&B, Covid) Nasopharyngeal Swab     Status: None  ? Collection Time: 08/05/21  3:04 PM  ? Specimen: Nasopharyngeal Swab; Nasopharyngeal(NP) swabs in vial transport medium  ?Result Value Ref Range Status  ? SARS Coronavirus 2 by RT PCR NEGATIVE NEGATIVE Final  ?  Comment: (NOTE) ?SARS-CoV-2 target nucleic acids are NOT DETECTED. ? ?The SARS-CoV-2 RNA is generally detectable in upper respiratory ?specimens  during the acute phase of infection. The lowest ?concentration of SARS-CoV-2 viral copies this assay can detect is ?138 copies/mL. A negative result does not preclude SARS-Cov-2 ?infection and should not be used as the sole basis for treatment or ?other patient management decisions. A negative result may occur with  ?improper specimen collection/handling, submission of specimen other ?than nasopharyngeal swab, presence of viral mutation(s) within the ?areas targeted by this assay, and inadequate number of viral ?copies(<138 copies/mL). A negative result must be combined with ?clinical observations, patient history, and epidemiological ?information. The expected result is Negative. ? ?Fact Sheet for Patients:  ?EntrepreneurPulse.com.au ? ?Fact Sheet for Healthcare Providers:  ?IncredibleEmployment.be ? ?This test is no t yet approved or cleared by the [place] FDA and  ?has been authorized for detection and/or diagnosis of SARS-CoV-2 by ?FDA under an Emergency Use Authorization (EUA). This EUA will remain  ?in effect (meaning this test can be used) for the duration of the ?COVID-19 declaration under Section 564(b)(1) of the Act, 21 ?U.S.C.section 360bbb-3(b)(1), unless the authorization is terminated  ?or revoked sooner.  ? ? ?  ? Influenza A by PCR NEGATIVE NEGATIVE Final  ? Influenza B by  PCR NEGATIVE NEGATIVE Final  ?  Comment: (NOTE) ?The Xpert Xpress SARS-CoV-2/FLU/RSV plus assay is intended as an aid ?in the diagnosis of influenza from Nasopharyngeal swab specimens and ?should not be used as a sole basis for treatment. Nasal washings and ?aspirates are unacceptable for Xpert Xpress SARS-CoV-2/FLU/RSV ?testing. ? ?Fact Sheet for Patients: ?EntrepreneurPulse.com.au ? ?Fact Sheet for Healthcare Providers: ?IncredibleEmployment.be ? ?This test is not yet approved or cleared by the [place] FDA and ?has been authorized for detection  and/or diagnosis of SARS-CoV-2 by ?FDA under an Emergency Use Authorization (EUA). This EUA will remain ?in effect (meaning this test can be used) for the duration of the ?COVID-19 declaration under Section 564(b)(1) of the

## 2021-08-09 NOTE — Consult Note (Signed)
?Cardiology Consultation:  ? ?Patient ID: [name] ?MRN: [id number]; DOB: 07/16/1950 ? ?Admit date: 08/05/2021 ?Date of Consult: 08/09/2021 ? ?PCP:  Stephens Shire, MD ?  ?[facility] Providers ?Cardiologist:  Larae Grooms, MD  ?Electrophysiologist:  Thompson Grayer, MD   ? ? ?Patient Profile:  ? ?[name] is a 71 y.o. female with a hx of persistent Afib, HTN, HLD, OSA and Breast Ca s/p chemo and right mastectomy who is being seen 08/09/2021 for the evaluation of CHF at the request of Dr. Broadus John. ? ?History of Present Illness:  ? ?[name] is a 71 yo female with PMH noted above. She has been followed in the afib clinic and by Dr. Irish Lack as an outpatient. She was diagnosed with atrial fibrillation in 04/2017 and placed on low dose BB along with Xarelto. Had successful cardioversion. Was seen in the office 08/2017 and noted back in atrial fibrillation. Her Toprol was increased to 32m daily and continued on [place] Seen again in the clinic 09/2018 and remained in atrial fib. Last seen in the office on 06/12/21 for follow up. She was continued on Toprol 734mBID. Was noted to have increased LE edema. Switched from losartan to Hyaar, increased furosemide from 2021mo 2m93mily.  ? ?Presented to the ED on 3/13 with complaints of worsening dyspnea on exertion and shortness of breath. Also increased LE edema. Several episodes of n/v/d. States she had not been fully complaint with lasix since her last office visit, but had been taking on regular basis for several weeks before admission. Reports good UOP at home. Diet had not been great. Breathing gradually worsened and developed orthopnea and PND. Thinks she had a 30lb weight gain over the past 2 months.  ? ?Labs on admission Na 143, K 3.5, Cr 1.13, BNP 630, hsTn 17>>22, WBC 6.3, Hgb 12.7. EKG on admission showed Afib 93 bpm. CXR with interstitial pulmonary opacity, likely edema. She was admitted to IM for further management. Started on IV lasix. Net - 9.8L,  weight is down 298>>278lbs.  ? ?Echo 3/14 showed LVEF 65-70%, no rWMA, moderate LVH, mildly reduced RV, severe bi-atrial enlargement, moderate MR, severe TR. ? ?Cardiology now asked to evaluate. ? ? ? ?Past Medical History:  ?Diagnosis Date  ? Arthritis   ? Asthma   ? Blood transfusion   ? hx of last one in 1987  ? Breast cancer (HCC)[place]13  ? Right Breast Cancer  ? Cancer of lower-inner quadrant of female breast (HCC)[place]28/2013  ? right breast cancer /IDC,stage IIB,er/pr=+,her2=Neg  ? Glaucoma   ? Heart murmur   ? History of chemotherapy   ? taxotere/cytoxan 6 cycles 09/22/11-01/05/12 day 2 neulasta  ? Hyperlipemia   ? Hypertension   ? Lymphedema   ? right arm wears a sleeve  ? MRSA (methicillin resistant staph aureus) culture positive   ? 08/2011  ? Neuromuscular disorder (HCC)[place]? peripheral neuropathy hands feet  ? OSA (obstructive sleep apnea)   ? CPAP settings- ?   ? Persistent atrial fibrillation (HCC)[place]? Pituitary abnormality (HCC)[place]-31-14  ? small pituitary growth-appears stable- Dr. CabbChristella Noalows  ? ? ?Past Surgical History:  ?Procedure Laterality Date  ? ABDOMINAL HYSTERECTOMY  2007  ? TAH/BSO  ? BREAST SURGERY    ? mastectomy RIGHT  ? CARDIOVERSION N/A 06/02/2017  ? Procedure: CARDIOVERSION;  Surgeon: NelsDorothy Spark;  [facility];  Laterality: N/A;  ? CARPAL TUNNEL RELEASE  Right   ? 2012  ? CHOLECYSTECTOMY  1987  ? open - Dr Lindon Romp  ? COLONOSCOPY N/A 06/13/2013  ? Procedure: COLONOSCOPY;  Surgeon: Juanita Craver, MD;  Location: [facility];  Service: Endoscopy;  Laterality: N/A;  ? DILATION AND CURETTAGE OF UTERUS    ? x3  ? EYE SURGERY    ? laser eye surgery for glaucoma  ? MASTECTOMY MODIFIED RADICAL Right 08/14/11  ? right , ER/PR +, HER2 -  ? port-a-cath insertion    ? port-a-cath removal 1'14  ? PORT-A-CATH REMOVAL  06/22/2012  ? Procedure: MINOR REMOVAL PORT-A-CATH;  Surgeon: Haywood Lasso, MD;  Location: [facility];  Service: General;   Laterality: Left;  ? PORTACATH PLACEMENT  09/11/2011  ? Procedure: INSERTION PORT-A-CATH;  Surgeon: Joyice Faster. Cornett, MD;  Location: [facility];  Service: General;  Laterality: N/A;  Insert of Port  ? TUBAL LIGATION    ?  ? ?Home Medications:  ?Prior to Admission medications   ?Medication Sig Start Date End Date Taking? Authorizing Provider  ?acetaminophen (TYLENOL) 325 MG tablet Take 325-650 mg by mouth every 6 (six) hours as needed (for pain.).   Yes [provider]  ?albuterol (PROAIR HFA) 108 (90 Base) MCG/ACT inhaler Inhale 1-2 puffs into the lungs every 6 (six) hours as needed for wheezing or shortness of breath. For shortness of breath 03/28/20  Yes Parrett, Tammy S, NP  ?amLODipine (NORVASC) 2.5 MG tablet Take 1 tablet (2.5 mg total) by mouth daily. Please make overdue appt with Dr. Irish Lack before anymore refills. 1st attempt 10/21/19  Yes Jettie Booze, MD  ?cabergoline (DOSTINEX) 0.5 MG tablet Take 0.5 mg by mouth 2 (two) times a week. Tuesday and Thursday   Yes [provider]  ?Cholecalciferol (VITAMIN D3) 1000 UNITS CAPS Take 1,000 Units by mouth every other day.    Yes [provider]  ?furosemide (LASIX) 40 MG tablet Take 1 tablet (40 mg total) by mouth daily. 06/12/21  Yes Jettie Booze, MD  ?hydroxypropyl methylcellulose / hypromellose (ISOPTO TEARS / GONIOVISC) 2.5 % ophthalmic solution Place 1 drop into both eyes 3 (three) times daily as needed for dry eyes.   Yes [provider]  ?losartan (COZAAR) 100 MG tablet Take 1 tablet (100 mg total) by mouth daily. 06/12/21  Yes Jettie Booze, MD  ?metoprolol succinate (TOPROL-XL) 50 MG 24 hr tablet TAKE ONE AND ONE-HALF TABLETS TWICE A DAY ?Patient taking differently: Take 50 mg by mouth See admin instructions. TAKE ONE AND ONE-HALF TABLETS TWICE A DAY 01/15/21  Yes Jettie Booze, MD  ?Multiple Vitamin (MULITIVITAMIN WITH MINERALS) TABS Take 1 tablet by mouth daily with breakfast.    Yes [provider]  ?potassium chloride SA (KLOR-CON) 20 MEQ tablet Take 2 tablets by mouth daily. Take when taking lasix(Furosemide). Please make overdue appt with Dr. Irish Lack before anymore refills. 1st attempt 10/21/19  Yes Jettie Booze, MD  ?[facility] 1-0.2 % SUSP Place 1 drop into both eyes 3 (three) times daily.  02/03/17  Yes [provider]  ?simvastatin (ZOCOR) 20 MG tablet TAKE 1 TABLET AT BEDTIME ?Patient taking differently: Take 20 mg by mouth daily at 6 PM. 10/07/19  Yes Imogene Burn, PA-C  ?vitamin B-12 (CYANOCOBALAMIN) 500 MCG tablet Take 500 mcg by mouth daily.   Yes [provider]  ?XARELTO 20 MG TABS tablet TAKE 1 TABLET DAILY WITH SUPPER ?Patient taking differently: Take 20 mg by mouth daily with supper.  06/26/21  Yes Jettie Booze, MD  ?GLUCOSAMINE HCL PO Take 1 tablet by mouth daily. ?Patient not taking: Reported on 08/05/2021    [provider]  ? ? ?Inpatient Medications: ?Scheduled Meds: ? brinzolamide  1 drop Both Eyes TID  ? And  ? brimonidine  1 drop Both Eyes TID  ? cabergoline  0.5 mg Oral Once per day on Tue Thu  ? chlorhexidine  15 mL Mouth Rinse BID  ? cholecalciferol  1,000 Units Oral QODAY  ? Living Better with Heart Failure Book   Does not apply Once  ? metoprolol succinate  50 mg Oral BID  ? midodrine  5 mg Oral BID WC  ? multivitamin with minerals  1 tablet Oral Daily  ? polyethylene glycol  17 g Oral Daily  ? potassium chloride  40 mEq Oral Once  ? rivaroxaban  20 mg Oral Q supper  ? senna-docusate  1 tablet Oral BID  ? simvastatin  20 mg Oral QHS  ? vitamin B-12  500 mcg Oral Daily  ? ?Continuous Infusions: ? ?PRN Meds: ?acetaminophen, albuterol, guaiFENesin-dextromethorphan, polyvinyl alcohol ? ?Allergies:    ?Allergies  ?Allergen Reactions  ? Trandolapril-Verapamil Hcl Er Swelling  ?  Causes lips to swell [name]" is the name brand) ?Tolerates amlodipine  ? Trandolapril-Verapamil Hcl Er   ? ? ?Social History:   ?Social History   ? ?Socioeconomic History  ? Marital status: Married  ?  Spouse name: Not on file  ? Number of children: Not on file  ? Years of education: Not on file  ? Highest education level: Not on file  ?Occupational History

## 2021-08-09 NOTE — Care Management Important Message (Signed)
Important Message ? ?Patient Details  ?Name: [name] ?MRN: [id number] ?Date of Birth: 1951/04/20 ? ? ?Medicare Important Message Given:  Yes ? ? ? ? ?Shelda Altes ?08/09/2021, 9:26 AM ?

## 2021-08-09 NOTE — Progress Notes (Signed)
Occupational Therapy Treatment ?Patient Details ?Name: [name] ?MRN: [id number] ?DOB: [birth year] ?Today's Date: 08/09/2021 ? ? ?History of present illness Patient is a 71 y/o female who presents on 08/05/21 with SOB, diarrhea, nausea. CXR- bil pulmonary opacities likely pulmonary edema. Admitted with acute respiratory failure due to decompensated heart failure. PMH includes breast ca s/p mastectomy, chemotherapy and radiation, A-fib. HTN. ?  ?OT comments ? Patient continues to make steady progress towards goals in skilled OT session. Patient's session encompassed energy conservation strategies with handout provided. Patient able to verbally walk through energy conservation strategies as well as ask appropriate questions about adaptive equipment and getting [facility] is improved. Discharge with HHOT remains appropriate, therapy will continue to follow.   ? ?Recommendations for follow up therapy are one component of a multi-disciplinary discharge planning process, led by the attending physician.  Recommendations may be updated based on patient status, additional functional criteria and insurance authorization. ?   ?Follow Up Recommendations ? Home health OT  ?  ?Assistance Recommended at Discharge PRN  ?Patient can return home with the following ? A little help with walking and/or transfers;A little help with bathing/dressing/bathroom ?  ?Equipment Recommendations ? Tub/shower seat;Other (comment)  ?  ?Recommendations for Other Services   ? ?  ?Precautions / Restrictions Precautions ?Precautions: Fall ?Precaution Comments: watch O2 ?Restrictions ?Weight Bearing Restrictions: No  ? ? ?  ? ?Mobility Bed Mobility ?  ?  ?  ?  ?  ?  ?  ?General bed mobility comments: up in chair when OT arrived ?  ? ?Transfers ?  ?  ?  ?  ?  ?  ?  ?  ?  ?  ?  ?  ?Balance   ?  ?  ?  ?  ?  ?  ?  ?  ?  ?  ?  ?  ?  ?  ?  ?  ?  ?  ?   ? ?ADL either performed or assessed with  clinical judgement  ? ?ADL Overall ADL's : Needs assistance/impaired ?  ?  ?  ?  ?  ?  ?  ?  ?  ?  ?  ?  ?  ?  ?  ?  ?  ?  ?  ?General ADL Comments: Patient ambulating with mobility specialist earlier, session focus on energy conservation strategies with handout provided. ?  ? ?Extremity/Trunk Assessment   ?  ?  ?  ?  ?  ? ?Vision   ?  ?  ?Perception   ?  ?Praxis   ?  ? ?Cognition Arousal/Alertness: Awake/alert ?Behavior During Therapy: [facility] for tasks assessed/performed ?Overall Cognitive Status: Within Functional Limits for tasks assessed ?  ?  ?  ?  ?  ?  ?  ?  ?  ?  ?  ?  ?  ?  ?  ?  ?General Comments: motivated and participatory ?  ?  ?   ?Exercises   ? ?  ?Shoulder Instructions   ? ? ?  ?General Comments    ? ? ?Pertinent Vitals/ Pain       Pain Assessment ?Pain Assessment: No/denies pain ? ?Home Living   ?  ?  ?  ?  ?  ?  ?  ?  ?  ?  ?  ?  ?  ?  ?  ?  ?  ?  ? ?  ?  Prior Functioning/Environment    ?  ?  ?  ?   ? ?Frequency ? Min 2X/week  ? ? ? ? ?  ?Progress Toward Goals ? ?OT Goals(current goals can now be found in the care plan section) ? Progress towards OT goals: Progressing toward goals ? ?Acute Rehab OT Goals ?Patient Stated Goal: [facility] I feel stronger ?OT Goal Formulation: With patient/family ?Time For Goal Achievement: 08/20/21 ?Potential to Achieve Goals: Good  ?Plan Discharge plan remains appropriate   ? ?Co-evaluation ? ? ?   ?  ?  ?  ?  ? ?  ?AM-PAC OT "6 Clicks" Daily Activity     ?Outcome Measure ? ? Help from another person eating meals?: None ?Help from another person taking care of personal grooming?: None ?Help from another person toileting, which includes using toliet, bedpan, or urinal?: A Little ?Help from another person bathing (including washing, rinsing, drying)?: A Little ?Help from another person to put on and taking off regular upper body clothing?: A Little ?Help from another person to put on and taking off regular lower body clothing?: A Lot ?6  Click Score: 19 ? ?  ?End of Session Equipment Utilized During Treatment: Other (comment) (Energy conservation handout) ? ?OT Visit Diagnosis: Unsteadiness on feet (R26.81);Muscle weakness (generalized) (M62.81) ?  ?Activity Tolerance Patient tolerated treatment well ?  ?Patient Left in chair;with call bell/phone within reach ?  ?Nurse Communication Mobility status ?  ? ?   ? ?Time: 7858-8502 ?OT Time Calculation (min): 20 min ? ?Charges: OT General Charges ?$OT Visit: 1 Visit ?OT Treatments ?$Self Care/Home Management : 8-22 mins ? ?[name], [name] ?Acute Rehabilitation Services ?[phone] ?[phone]  ? ?[name] ?08/09/2021, 12:35 PM ?

## 2021-08-10 ENCOUNTER — Encounter (HOSPITAL_COMMUNITY): Payer: Self-pay | Admitting: Family Medicine

## 2021-08-10 DIAGNOSIS — I509 Heart failure, unspecified: Secondary | ICD-10-CM | POA: Diagnosis not present

## 2021-08-10 DIAGNOSIS — J9601 Acute respiratory failure with hypoxia: Secondary | ICD-10-CM | POA: Diagnosis not present

## 2021-08-10 DIAGNOSIS — I482 Chronic atrial fibrillation, unspecified: Secondary | ICD-10-CM | POA: Diagnosis not present

## 2021-08-10 LAB — BASIC METABOLIC PANEL
Anion gap: 7 (ref 5–15)
BUN: 16 mg/dL (ref 8–23)
CO2: 36 mmol/L — ABNORMAL HIGH (ref 22–32)
Calcium: 8.9 mg/dL (ref 8.9–10.3)
Chloride: 97 mmol/L — ABNORMAL LOW (ref 98–111)
Creatinine, Ser: 1.19 mg/dL — ABNORMAL HIGH (ref 0.44–1.00)
GFR, Estimated: 49 mL/min — ABNORMAL LOW (ref >60)
Glucose, Bld: 112 mg/dL — ABNORMAL HIGH (ref 70–99)
Potassium: 3.6 mmol/L (ref 3.5–5.1)
Sodium: 140 mmol/L (ref 135–145)

## 2021-08-10 MED ORDER — FUROSEMIDE 40 MG PO TABS
40.0000 mg | ORAL_TABLET | Freq: Two times a day (BID) | ORAL | Status: DC
  Administered 2021-08-10: 40 mg via ORAL
  Filled 2021-08-10: qty 1

## 2021-08-10 MED ORDER — FUROSEMIDE 40 MG PO TABS
40.0000 mg | ORAL_TABLET | Freq: Two times a day (BID) | ORAL | 2 refills | Status: DC
Start: 2021-08-10 — End: 2021-08-20

## 2021-08-10 MED ORDER — EMPAGLIFLOZIN 10 MG PO TABS: 10.0000 mg | ORAL_TABLET | Freq: Every day | ORAL | 0 refills | Status: DC

## 2021-08-10 MED ORDER — POTASSIUM CHLORIDE CRYS ER 20 MEQ PO TBCR: 40.0000 meq | EXTENDED_RELEASE_TABLET | Freq: Every day | ORAL | 0 refills | Status: DC

## 2021-08-10 MED ORDER — EMPAGLIFLOZIN 10 MG PO TABS
10.0000 mg | ORAL_TABLET | Freq: Every day | ORAL | Status: DC
  Administered 2021-08-10: 10 mg via ORAL
  Filled 2021-08-10: qty 1

## 2021-08-10 NOTE — Progress Notes (Signed)
? ?Progress Note ? ?Patient Name: [name] ?Date of Encounter: 08/10/2021 ? ?[facility] Cardiologist: Larae Grooms, MD  ? ?Subjective  ? ?Patient reports much improvement since admission. Notes legs are always edematous.  ? ?Normal renal fxn ?Total net negative 10L ? ?Inpatient Medications  ?  ?Scheduled Meds: ? brinzolamide  1 drop Both Eyes TID  ? And  ? brimonidine  1 drop Both Eyes TID  ? cabergoline  0.5 mg Oral Once per day on Tue Thu  ? chlorhexidine  15 mL Mouth Rinse BID  ? cholecalciferol  1,000 Units Oral QODAY  ? empagliflozin  10 mg Oral Daily  ? furosemide  40 mg Oral BID  ? Living Better with Heart Failure Book   Does not apply Once  ? metoprolol succinate  50 mg Oral BID  ? multivitamin with minerals  1 tablet Oral Daily  ? polyethylene glycol  17 g Oral Daily  ? rivaroxaban  20 mg Oral Q supper  ? senna-docusate  1 tablet Oral BID  ? simvastatin  20 mg Oral QHS  ? vitamin B-12  500 mcg Oral Daily  ? ?Continuous Infusions: ? ?PRN Meds: ?acetaminophen, albuterol, guaiFENesin-dextromethorphan, polyvinyl alcohol  ? ?Vital Signs  ?  ?Vitals:  ? 08/09/21 1332 08/09/21 2100 08/09/21 2203 08/10/21 0500  ?BP: 107/63  126/64 111/71  ?Pulse: 90 80 81 74  ?Resp: 19 18 15 16   ?Temp: 98.3 ?F (36.8 ?C)  98 ?F (36.7 ?C) 98.5 ?F (36.9 ?C)  ?TempSrc: Oral  Axillary Oral  ?SpO2: 97% 97% 99% 97%  ?Weight:    126.8 kg  ?Height:      ? ? ?Intake/Output Summary (Last 24 hours) at 08/10/2021 1112 ?Last data filed at 08/10/2021 0522 ?Gross per 24 hour  ?Intake --  ?Output 200 ml  ?Net -200 ml  ? ?Last 3 Weights 08/10/2021 08/09/2021 08/08/2021  ?Weight (lbs) 279 lb 9.6 oz 278 lb 6.4 oz 285 lb 11.5 oz  ?Weight (kg) 126.826 kg 126.281 kg 129.6 kg  ?   ? ?Telemetry  ?  ?Rate controlled afib  - Personally Reviewed ? ?ECG  ?  ?No new - Personally Reviewed ? ?Physical Exam  ? ?Vitals:  ? 08/09/21 2203 08/10/21 0500  ?BP: 126/64 111/71  ?Pulse: 81 74  ?Resp: 15 16  ?Temp: 98 ?F (36.7 ?C) 98.5 ?F (36.9 ?C)  ?SpO2: 99%  97%  ? ? ?GEN: No acute distress.   ?Neck: No JVD ?Cardiac: RRR, no murmurs, rubs, or gallops.  ?Respiratory: Clear to auscultation bilaterally. ?GI: Soft, nontender, non-distended  ?MS: 2+ pitting edema ?Neuro:  Nonfocal  ?Psych: Normal affect  ? ?Labs  ?  ?High Sensitivity Troponin:   ?Recent Labs  ?Lab 08/05/21 ?1609 08/05/21 ?1853  ?TROPONINIHS 17 22*  ?   ?Chemistry ?Recent Labs  ?Lab 08/05/21 ?1609 08/06/21 ?0243 08/07/21 ?0430 08/08/21 ?5102 08/09/21 ?0218 08/09/21 ?1325 08/10/21 ?0335  ?NA 143 142 139 140 139 138 140  ?K 3.5 3.3* 4.6 3.7 3.6 4.9 3.6  ?CL 105 103 100 96* 94* 95* 97*  ?CO2 26 28 27  36* 36* 31 36*  ?GLUCOSE 105* 122* 93 93 115* 119* 112*  ?BUN 10 12 16 14 17 18 16   ?CREATININE 1.13* 1.08* 1.16* 1.30* 1.43* 1.21* 1.19*  ?CALCIUM 9.3 9.0 8.6* 8.8* 8.7* 8.8* 8.9  ?MG  --   --  1.7  --  1.9  --   --   ?PROT 7.1  --   --  6.0*  --   --   --   ?  ALBUMIN 4.0  --   --  3.2*  --   --   --   ?AST 19  --   --  14*  --   --   --   ?ALT 13  --   --  10  --   --   --   ?ALKPHOS 88  --   --  71  --   --   --   ?BILITOT 5.6* 4.6*  --  2.5*  --   --   --   ?GFRNONAA 52* 55* 50* 44* 39* 48* 49*  ?ANIONGAP 12 11 12 8 9 12 7   ?  ?Lipids No results for input(s): CHOL, TRIG, HDL, LABVLDL, LDLCALC, CHOLHDL in the last 168 hours.  ?Hematology ?Recent Labs  ?Lab 08/05/21 ?1609 08/08/21 ?0337  ?WBC 6.3 5.1  ?RBC 4.15 3.58*  ?HGB 12.7 11.2*  ?HCT 41.3 35.8*  ?MCV 99.5 100.0  ?MCH 30.6 31.3  ?MCHC 30.8 31.3  ?RDW 14.7 14.8  ?PLT 152 171  ? ?Thyroid No results for input(s): TSH, FREET4 in the last 168 hours.  ?BNP ?Recent Labs  ?Lab 08/05/21 ?1609  ?BNP 630.1*  ?  ?DDimer No results for input(s): DDIMER in the last 168 hours.  ? ?Radiology  ?  ?No results found. ? ?Cardiac Studies  ? ?1. Left ventricular ejection fraction, by estimation, is 65 to 70%. The  ?left ventricle has normal function. The left ventricle has no regional  ?wall motion abnormalities. There is moderate concentric left ventricular  ?hypertrophy. Left  ventricular  ?diastolic function could not be evaluated. There is the interventricular  ?septum is flattened in diastole ('D' shaped left ventricle), consistent  ?with right ventricular volume overload.  ? 2. Right ventricular systolic function is mildly reduced. The right  ?ventricular size is mildly enlarged. There is moderately elevated  ?pulmonary artery systolic pressure. The estimated right ventricular  ?systolic pressure is 99.3 mmHg.  ? 3. Left atrial size was severely dilated.  ? 4. Right atrial size was severely dilated.  ? 5. The mitral valve was not well visualized. Moderate mitral valve  ?regurgitation. Moderate mitral annular calcification.  ? 6. Tricuspid valve regurgitation is severe.  ? 7. The aortic valve is tricuspid. There is mild calcification of the  ?aortic valve. There is mild thickening of the aortic valve. Aortic valve  ?regurgitation is mild to moderate. No aortic stenosis is present.  ? 8. There is borderline dilatation of the ascending aorta, measuring 38  ?mm.  ? 9. The inferior vena cava is dilated in size with <50% respiratory  ?variability, suggesting right atrial pressure of 15 mmHg.  ? ?Patient Profile  ?   ?[name] is a 71 y.o. female with a hx of persistent Afib, HTN, HLD, OSA and Breast Ca s/p chemo and right mastectomy who is being seen 08/09/2021 for the evaluation of CHF at the request of Dr. Broadus John. [name] presented a few days ago with decompensated Hfpef.  ?Assessment & Plan  ?  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 She's compliant with cpap.  Echo shows mild -moderate AI, eccentric moderate MR posteriorly directed, no WMA. C/f primary MR. She has normal LV function. She has been diuresing well. Had mild crt bump and lasix was held. Clinically she is improving. She denies PND/orthopnea. Has edematous LE which is chronic. Weights are down 298->278. Dry weight ~ 274. She is warm and still mildly wet. She is near her  dry weight. ?- agree with  oral lasix ?- compression stockings for chronic lymphadema ?- continue statin ?- continue SGLT2 ? ? ?Persistent Afib:   ?- rate controlled ?- continue toprol 50 mg BID ?- continue xarelto ?   ? ?HTN- Bps in good control. No persistently hypotensive. Continue BB. If has room in the future, can add spironolactone. ? ?Stable from a cardiac standpoint for discharge. ? ?For questions or updates, please contact [place] ?Please consult [email or web address] for contact info under  ? ?  ?   ?Signed, ?Janina Mayo, MD  ?08/10/2021, 11:12 AM   ? ?

## 2021-08-10 NOTE — TOC Transition Note (Addendum)
Transition of Care (TOC) - CM/SW Discharge Note ? ? ?Patient Details  ?Name: [name] ?MRN: [id number] ?Date of Birth: 1951/02/05 ? ?Transition of Care (TOC) CM/SW Contact:  ?Konrad Penta, RN ?Phone Number: [phone] ?08/10/2021, 11:55 AM ? ? ?Clinical Narrative:   Spoke with [name] prior to discharge. Confirmed she has received rolling walker. Requested home health aide be added to home health orders. Made Cory aware with Alvis Lemmings that [name] is discharging home today and that nursing aide was added. [name] states husband will pick her up today.  ? ?No further TOC needs identified.  ? ? ? ?Final next level of care: [place] ?Barriers to Discharge: No Barriers Identified ? ? ?Patient Goals and CMS Choice ?Patient states their goals for this hospitalization and ongoing recovery are:: return home ?  ?Choice offered to / list presented to : Patient ? ?Discharge Placement ?  ?           ?  ?  ?  ?  ? ?Discharge Plan and Services ?  ?Discharge Planning Services: CM Consult ?Post Acute Care Choice: Home Health, Durable Medical Equipment          ?DME Arranged: Walker rolling with seat ?DME Agency: [employer] ?Date DME Agency Contacted: 08/08/21 ?Time DME Agency Contacted: 0071 ?Representative spoke with at DME Agency: Freda Munro ?HH Arranged: PT, OT ?[place] Agency: [place] ?Date HH Agency Contacted: 08/08/21 ?Time [place]: [phone] ?Representative spoke with at [place]: Adela Lank ? ?Social Determinants of Health (SDOH) Interventions ?  ? ? ?Readmission Risk Interventions ?No flowsheet data found. ? ? ? ? ?

## 2021-08-20 ENCOUNTER — Telehealth (HOSPITAL_COMMUNITY): Payer: Self-pay | Admitting: *Deleted

## 2021-08-20 ENCOUNTER — Encounter (HOSPITAL_COMMUNITY): Payer: Self-pay

## 2021-08-20 ENCOUNTER — Other Ambulatory Visit: Payer: Self-pay

## 2021-08-20 ENCOUNTER — Ambulatory Visit (HOSPITAL_COMMUNITY)
Admit: 2021-08-20 | Discharge: 2021-08-20 | Disposition: A | Discharge status: Home / Self Care | Payer: Medicare Other | Attending: Cardiology | Admitting: Cardiology

## 2021-08-20 ENCOUNTER — Other Ambulatory Visit (HOSPITAL_COMMUNITY): Payer: Self-pay | Admitting: *Deleted

## 2021-08-20 VITALS — BP 130/78 | HR 70 | Wt 277.0 lb

## 2021-08-20 DIAGNOSIS — I4819 Other persistent atrial fibrillation: Secondary | ICD-10-CM | POA: Insufficient documentation

## 2021-08-20 DIAGNOSIS — E785 Hyperlipidemia, unspecified: Secondary | ICD-10-CM | POA: Insufficient documentation

## 2021-08-20 DIAGNOSIS — G629 Polyneuropathy, unspecified: Secondary | ICD-10-CM | POA: Diagnosis not present

## 2021-08-20 DIAGNOSIS — I083 Combined rheumatic disorders of mitral, aortic and tricuspid valves: Secondary | ICD-10-CM | POA: Diagnosis not present

## 2021-08-20 DIAGNOSIS — Z853 Personal history of malignant neoplasm of breast: Secondary | ICD-10-CM | POA: Insufficient documentation

## 2021-08-20 DIAGNOSIS — I77819 Aortic ectasia, unspecified site: Secondary | ICD-10-CM | POA: Diagnosis not present

## 2021-08-20 DIAGNOSIS — N1832 Chronic kidney disease, stage 3b: Secondary | ICD-10-CM | POA: Insufficient documentation

## 2021-08-20 DIAGNOSIS — Z9221 Personal history of antineoplastic chemotherapy: Secondary | ICD-10-CM | POA: Insufficient documentation

## 2021-08-20 DIAGNOSIS — Z9989 Dependence on other enabling machines and devices: Secondary | ICD-10-CM | POA: Insufficient documentation

## 2021-08-20 DIAGNOSIS — I5032 Chronic diastolic (congestive) heart failure: Secondary | ICD-10-CM | POA: Diagnosis not present

## 2021-08-20 DIAGNOSIS — I272 Pulmonary hypertension, unspecified: Secondary | ICD-10-CM

## 2021-08-20 DIAGNOSIS — Z923 Personal history of irradiation: Secondary | ICD-10-CM | POA: Diagnosis not present

## 2021-08-20 DIAGNOSIS — I482 Chronic atrial fibrillation, unspecified: Secondary | ICD-10-CM | POA: Diagnosis not present

## 2021-08-20 DIAGNOSIS — Z79899 Other long term (current) drug therapy: Secondary | ICD-10-CM | POA: Diagnosis not present

## 2021-08-20 DIAGNOSIS — J45909 Unspecified asthma, uncomplicated: Secondary | ICD-10-CM | POA: Diagnosis not present

## 2021-08-20 DIAGNOSIS — Z7901 Long term (current) use of anticoagulants: Secondary | ICD-10-CM | POA: Diagnosis not present

## 2021-08-20 DIAGNOSIS — E669 Obesity, unspecified: Secondary | ICD-10-CM | POA: Insufficient documentation

## 2021-08-20 DIAGNOSIS — G4733 Obstructive sleep apnea (adult) (pediatric): Secondary | ICD-10-CM | POA: Diagnosis not present

## 2021-08-20 DIAGNOSIS — Z9011 Acquired absence of right breast and nipple: Secondary | ICD-10-CM | POA: Insufficient documentation

## 2021-08-20 DIAGNOSIS — Z7984 Long term (current) use of oral hypoglycemic drugs: Secondary | ICD-10-CM | POA: Diagnosis not present

## 2021-08-20 DIAGNOSIS — I130 Hypertensive heart and chronic kidney disease with heart failure and stage 1 through stage 4 chronic kidney disease, or unspecified chronic kidney disease: Secondary | ICD-10-CM | POA: Insufficient documentation

## 2021-08-20 MED ORDER — EMPAGLIFLOZIN 10 MG PO TABS: 10.0000 mg | ORAL_TABLET | Freq: Every day | ORAL | 6 refills | Status: DC

## 2021-08-20 MED ORDER — FUROSEMIDE 40 MG PO TABS
60.0000 mg | ORAL_TABLET | Freq: Two times a day (BID) | ORAL | 2 refills | Status: DC
Start: 2021-08-20 — End: 2021-08-28

## 2021-08-20 NOTE — H&P (View-Only) (Signed)
? ? ?HEART & VASCULAR TRANSITION OF CARE CONSULT NOTE  ? ? ? ?Referring Physician: Dr. Mary Branch  ?Primary Care: [name], [name], MD  ?Primary Cardiologist: Jayadeep Varanasi, MD ? ? ?HPI: ?Referred to clinic by Dr. Mary Branch for heart failure consultation.  ? ?70 y/o female w/ chronic diastolic heart failure, RV dysfunction, pulmonary HTN, persistent atrial fibrillation, MR/TR, HTN, HLD, h/o rt sided breast cancer in 2013 tx w/ mastectomy, chemo + radiation , CKD stage IIIb and peripheral neuropathy,  w/ recent admit for a/c diastolic heart failure. Evaluated by cardiology and diuresed w/ IV Lasix. Was noted to be in rate controlled afib, persistent. 2D echo showed hyperdynamic LVEF, 65-70%, D shaped LV c/w RV volume overload, Grade IIDD w/ mod LVH, mildly reduced RV and severely elevated RVSP, 58 mmHg, moderate MR w/ severe BAE and severe TR.  ? ?After diuresis, was switched back to oral lasix + Jardiance. Continued on Xarelto and metoprolol for afib. Referred to TOC clinic. D/c Wt 279 lb.  ? ?Presents today for f/u. Here w/ husband. Notes subjective improvement w/ less dyspnea but remains fluid overloaded w/ NYHA class II-III symptoms. Denies resting dyspnea. Reports full compliance w/ meds and good UOP w/ PO Lasix. Wt stable since d/c, down 2 lb at 277 lb. Reports full compliance w/ CPAP. Fully compliant w/ Xarelto. Denies h/o PE/DVT. No family h/o HF. Denies CP. Non smoker. + h/o asthma but no h/o COPD. No known h/o CTD.  ? ?BP controlled. HR 70s.  ? ? ?Cardiac Testing  ? ?1. Left ventricular ejection fraction, by estimation, is 65 to 70%. The  ?left ventricle has normal function. The left ventricle has no regional  ?wall motion abnormalities. There is moderate concentric left ventricular  ?hypertrophy. Left ventricular  ?diastolic function could not be evaluated. There is the interventricular  ?septum is flattened in diastole ('D' shaped left ventricle), consistent  ?with right ventricular volume  overload.  ? 2. Right ventricular systolic function is mildly reduced. The right  ?ventricular size is mildly enlarged. There is moderately elevated  ?pulmonary artery systolic pressure. The estimated right ventricular  ?systolic pressure is 57.8 mmHg.  ? 3. Left atrial size was severely dilated.  ? 4. Right atrial size was severely dilated.  ? 5. The mitral valve was not well visualized. Moderate mitral valve  ?regurgitation. Moderate mitral annular calcification.  ? 6. Tricuspid valve regurgitation is severe.  ? 7. The aortic valve is tricuspid. There is mild calcification of the  ?aortic valve. There is mild thickening of the aortic valve. Aortic valve  ?regurgitation is mild to moderate. No aortic stenosis is present.  ? 8. There is borderline dilatation of the ascending aorta, measuring 38  ?mm.  ? 9. The inferior vena cava is dilated in size with <50% respiratory  ?variability, suggesting right atrial pressure of 15 mmHg.  ? ? ?Review of Systems: [y] = yes, [ ] = no  ? ?General: Weight gain [ ]; Weight loss [ ]; Anorexia [ ]; Fatigue [ ]; Fever [ ]; Chills [ ]; Weakness [ ]  ?Cardiac: Chest pain/pressure [ ]; Resting SOB [ ]; Exertional SOB [ Y]; Orthopnea [ ]; Pedal Edema [ Y]; Palpitations [ ]; Syncope [ ]; Presyncope [ ]; Paroxysmal nocturnal dyspnea[ ]  ?Pulmonary: Cough [ ]; Wheezing[ ]; Hemoptysis[ ]; Sputum [ ]; Snoring [ ]  ?GI: Vomiting[ ]; Dysphagia[ ]; Melena[ ]; Hematochezia [ ]; Heartburn[ ]; Abdominal pain [ ]; Constipation [ ]; Diarrhea [ ];   BRBPR [ ]  ?GU: Hematuria[ ]; Dysuria [ ]; Nocturia[ ]  ?Vascular: Pain in legs with walking [ ]; Pain in feet with lying flat [ ]; Non-healing sores [ ]; Stroke [ ]; TIA [ ]; Slurred speech [ ];  ?Neuro: Headaches[ ]; Vertigo[ ]; Seizures[ ]; Paresthesias[ ];Blurred vision [ ]; Diplopia [ ]; Vision changes [ ]  ?Ortho/Skin: Arthritis [ ]; Joint pain [ ]; Muscle pain [ ]; Joint swelling [ ]; Back Pain [ ]; Rash [ ]  ?Psych: Depression[ ]; Anxiety[ ]  ?Heme:  Bleeding problems [ ]; Clotting disorders [ ]; Anemia [ ]  ?Endocrine: Diabetes [ ]; Thyroid dysfunction[ ] ? ? ?Past Medical History:  ?Diagnosis Date  ? Arthritis   ? Asthma   ? Blood transfusion   ? hx of last one in 1987  ? Breast cancer (HCC) 2013  ? Right Breast Cancer  ? Cancer of lower-inner quadrant of female breast (HCC) 07/24/2011  ? right breast cancer /IDC,stage IIB,er/pr=+,her2=Neg  ? Glaucoma   ? Heart murmur   ? History of chemotherapy   ? taxotere/cytoxan 6 cycles 09/22/11-01/05/12 day 2 neulasta  ? Hyperlipemia   ? Hypertension   ? Lymphedema   ? right arm wears a sleeve  ? MRSA (methicillin resistant staph aureus) culture positive   ? 08/2011  ? Neuromuscular disorder (HCC)   ? peripheral neuropathy hands feet  ? OSA (obstructive sleep apnea)   ? CPAP settings- ?   ? Persistent atrial fibrillation (HCC)   ? 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 small pituitary growth-appears stable- Dr. Cabbell follows  ? ? ?Current Outpatient Medications  ?Medication Sig Dispense Refill  ? acetaminophen (TYLENOL) 325 MG tablet Take 325-650 mg by mouth every 6 (six) hours as needed (for pain.).    ? albuterol (PROAIR HFA) 108 (90 Base) MCG/ACT inhaler Inhale 1-2 puffs into the lungs every 6 (six) hours as needed for wheezing or shortness of breath. For shortness of breath 18 g 3  ? cabergoline (DOSTINEX) 0.5 MG tablet Take 0.5 mg by mouth 2 (two) times a week. Tuesday and Thursday    ? Cholecalciferol (VITAMIN D3) 1000 UNITS CAPS Take 1,000 Units by mouth every other day.     ? empagliflozin (JARDIANCE) 10 MG TABS tablet Take 1 tablet (10 mg total) by mouth daily. 30 tablet 0  ? furosemide (LASIX) 40 MG tablet Take 1 tablet (40 mg total) by mouth 2 (two) times daily. 60 tablet 2  ? GLUCOSAMINE HCL PO Take 1 tablet by mouth daily.    ? hydroxypropyl methylcellulose / hypromellose (ISOPTO TEARS / GONIOVISC) 2.5 % ophthalmic solution Place 1 drop into both eyes 3 (three) times daily as needed for dry eyes.    ?  metoprolol succinate (TOPROL-XL) 50 MG 24 hr tablet TAKE ONE AND ONE-HALF TABLETS TWICE A DAY 270 tablet 3  ? Multiple Vitamin (MULITIVITAMIN WITH MINERALS) TABS Take 1 tablet by mouth daily with breakfast.     ? potassium chloride SA (KLOR-CON M) 20 MEQ tablet Take 2 tablets (40 mEq total) by mouth daily. Take 2 tablets by mouth daily 60 tablet 0  ? SIMBRINZA 1-0.2 % SUSP Place 1 drop into both eyes 3 (three) times daily.     ? simvastatin (ZOCOR) 20 MG tablet TAKE 1 TABLET AT BEDTIME (Patient taking differently: Take 20 mg by mouth daily at 6 PM.) 90 tablet 3  ? vitamin B-12 (CYANOCOBALAMIN) 500 MCG tablet Take 500 mcg by mouth daily.    ?   XARELTO 20 MG TABS tablet TAKE 1 TABLET DAILY WITH SUPPER 90 tablet 3  ? ?No current facility-administered medications for this encounter.  ? ?Facility-Administered Medications Ordered in Other Encounters  ?Medication Dose Route Frequency Provider Last Rate Last Admin  ? lidocaine (cardiac) 100 mg/5ml (XYLOCAINE) 20 MG/ML injection 2%    Anesthesia Intra-op Jarvela, Joshua R, CRNA   60 mg at 06/02/17 1343  ? ? ?Allergies  ?Allergen Reactions  ? Trandolapril-Verapamil Hcl Er Swelling  ?  Causes lips to swell ("TARKA" is the name brand) ?Tolerates amlodipine  ? Trandolapril-Verapamil Hcl Er   ? ? ?  ?Social History  ? ?Socioeconomic History  ? Marital status: Married  ?  Spouse name: Not on file  ? Number of children: Not on file  ? Years of education: Not on file  ? Highest education level: Not on file  ?Occupational History  ? Not on file  ?Tobacco Use  ? Smoking status: Never  ? Smokeless tobacco: Never  ?Vaping Use  ? Vaping Use: Never used  ?Substance and Sexual Activity  ? Alcohol use: No  ? Drug use: No  ? Sexual activity: Yes  ?  Birth control/protection: Post-menopausal, Surgical  ?Other Topics Concern  ? Not on file  ?Social History Narrative  ? Not on file  ? ?Social Determinants of Health  ? ?Financial Resource Strain: Not on file  ?Food Insecurity: Not on file   ?Transportation Needs: Not on file  ?Physical Activity: Not on file  ?Stress: Not on file  ?Social Connections: Not on file  ?Intimate Partner Violence: Not on file  ? ? ?  ?Family History  ?Problem Relation Age of Onset

## 2021-08-20 NOTE — Patient Instructions (Addendum)
Increase Furosemide (Lasix) to 60 mg (1 & 1/2 tabs) Twice daily  ? ?Labs done today, we will call you for abnormal results ? ?Heart Catheterization next week, see instructions below ? ?Thank you for allowing [name] to provider your heart failure care after your recent hospitalization. Please follow-up with our [facility] in 2-3 weeks ? ?If you have any questions, issues, or concerns before your next appointment please call our office at [phone], opt. 2 and leave a message for the triage nurse. ? ? ?CATHETERIZATION INSTRUCTIONS: ? ?You are scheduled for a Cardiac Catheterization on Wednesday, April 5 with Dr. Loralie Champagne. ? ?1. Please arrive at the Main Entrance A at [facility]: [address] [place], [place] [address] at 11:00 AM  ? ?Special note: Every effort is made to have your procedure done on time. Please understand that emergencies sometimes delay scheduled procedures. ? ?2. Diet: Do not eat solid foods after midnight.  You may have clear liquids until 5 AM upon the day of the procedure. ? ?3. Labs: DONE TODAY ? ?4. Medication instructions in preparation for your procedure: ? ?  Tuesday 08/27/21 PM DO NOT TAKE XARELTO ? Wednesday 08/28/21 AM DO NOT TAKE FUROSEMIDE OR JARDIANCE ? ?On the morning of your procedure, take any morning medicines NOT listed above.  You may use sips of water. ? ?5. Plan to go home the same day, you will only stay overnight if medically necessary. ?6. You MUST have a responsible adult to drive you home. ?7. An adult MUST be with you the first 24 hours after you arrive home. ?8. Bring a current list of your medications, and the last time and date medication taken. ?9. Bring ID and current insurance cards. ?10.Please wear clothes that are easy to get on and off and wear slip-on shoes. ? ?Thank you for allowing [name] to care for you! ?  -- [facility] ? ?

## 2021-08-20 NOTE — Telephone Encounter (Signed)
Call attempted to confirm HV TOC appt 08/20/21 @ 10 am. HIPPA appropriate VM left with callback number.   ? ?Earnestine Leys, BSN, RN ?Heart Failure Nurse Navigator ?[phone]  ?

## 2021-08-20 NOTE — Progress Notes (Signed)
? ? ?HEART & VASCULAR TRANSITION OF CARE CONSULT NOTE  ? ? ? ?Referring Physician: Dr. Phineas Inches  ?Primary Care: Stephens Shire, MD  ?Primary Cardiologist: Larae Grooms, MD ? ? ?HPI: ?Referred to clinic by Dr. Phineas Inches for heart failure consultation.  ? ?71 y/o female w/ chronic diastolic heart failure, RV dysfunction, pulmonary HTN, persistent atrial fibrillation, MR/TR, HTN, HLD, h/o rt sided breast cancer in 2013 tx w/ mastectomy, chemo + radiation , CKD stage IIIb and peripheral neuropathy,  w/ recent admit for a/c diastolic heart failure. Evaluated by cardiology and diuresed w/ IV Lasix. Was noted to be in rate controlled afib, persistent. 2D echo showed hyperdynamic LVEF, 65-70%, D shaped LV c/w RV volume overload, Grade IIDD w/ mod LVH, mildly reduced RV and severely elevated RVSP, 58 mmHg, moderate MR w/ severe BAE and severe TR.  ? ?After diuresis, was switched back to oral lasix + Jardiance. Continued on Xarelto and metoprolol for afib. Referred to [facility] clinic. D/c Wt 279 lb.  ? ?Presents today for f/u. Here w/ husband. Notes subjective improvement w/ less dyspnea but remains fluid overloaded w/ NYHA class II-III symptoms. Denies resting dyspnea. Reports full compliance w/ meds and good UOP w/ PO Lasix. Wt stable since d/c, down 2 lb at 277 lb. Reports full compliance w/ CPAP. Fully compliant w/ Xarelto. Denies h/o PE/DVT. No family h/o HF. Denies CP. Non smoker. + h/o asthma but no h/o COPD. No known h/o CTD.  ? ?BP controlled. HR 70s.  ? ? ?Cardiac Testing  ? ?1. Left ventricular ejection fraction, by estimation, is 65 to 70%. The  ?left ventricle has normal function. The left ventricle has no regional  ?wall motion abnormalities. There is moderate concentric left ventricular  ?hypertrophy. Left ventricular  ?diastolic function could not be evaluated. There is the interventricular  ?septum is flattened in diastole ('D' shaped left ventricle), consistent  ?with right ventricular volume  overload.  ? 2. Right ventricular systolic function is mildly reduced. The right  ?ventricular size is mildly enlarged. There is moderately elevated  ?pulmonary artery systolic pressure. The estimated right ventricular  ?systolic pressure is 81.1 mmHg.  ? 3. Left atrial size was severely dilated.  ? 4. Right atrial size was severely dilated.  ? 5. The mitral valve was not well visualized. Moderate mitral valve  ?regurgitation. Moderate mitral annular calcification.  ? 6. Tricuspid valve regurgitation is severe.  ? 7. The aortic valve is tricuspid. There is mild calcification of the  ?aortic valve. There is mild thickening of the aortic valve. Aortic valve  ?regurgitation is mild to moderate. No aortic stenosis is present.  ? 8. There is borderline dilatation of the ascending aorta, measuring 38  ?mm.  ? 9. The inferior vena cava is dilated in size with <50% respiratory  ?variability, suggesting right atrial pressure of 15 mmHg.  ? ? ?Review of Systems: [y] = yes, _0  = no  ? ?General: Weight gain _1 ; Weight loss _2 ; Anorexia _3 ; Fatigue _4 ; Fever _5 ; Chills _6 ; Weakness _7   ?Cardiac: Chest pain/pressure _8 ; Resting SOB _9 ; Exertional SOB [ Y]; Orthopnea _10 ; Pedal Edema [ Y]; Palpitations _11 ; Syncope _12 ; Presyncope _13 ; Paroxysmal nocturnal dyspnea_14   ?Pulmonary: Cough _15 ; Wheezing_16 ; Hemoptysis_17 ; Sputum _18 ; Snoring _19   ?GI: Vomiting_20 ; Dysphagia_21 ; Melena_22 ; Hematochezia _23 ; Heartburn_24 ; Abdominal pain _25 ; Constipation _26 ; Diarrhea _27 ;  BRBPR _0   ?GU: Hematuria_1 ; Dysuria _2 ; Nocturia_3   ?Vascular: Pain in legs with walking _4 ; Pain in feet with lying flat _5 ; Non-healing sores _6 ; Stroke _7 ; TIA _8 ; Slurred speech _9 ;  ?Neuro: Headaches_10 ; Vertigo_11 ; Seizures_12 ; Paresthesias_13 ;Blurred vision _14 ; Diplopia _15 ; Vision changes _16   ?Ortho/Skin: Arthritis _17 ; Joint pain _18 ; Muscle pain _19 ; Joint swelling _20 ; Back Pain _21 ; Rash _22   ?Psych: Depression_23 ; Anxiety_24   ?Heme:  Bleeding problems _25 ; Clotting disorders _26 ; Anemia _27   ?Endocrine: Diabetes _28 ; Thyroid dysfunction_29  ? ? ?Past Medical History:  ?Diagnosis Date  ? Arthritis   ? Asthma   ? Blood transfusion   ? hx of last one in 1987  ? Breast cancer ([place]) 2013  ? Right Breast Cancer  ? Cancer of lower-inner quadrant of female breast ([place]) 07/24/2011  ? right breast cancer /IDC,stage IIB,er/pr=+,her2=Neg  ? Glaucoma   ? Heart murmur   ? History of chemotherapy   ? taxotere/cytoxan 6 cycles 09/22/11-01/05/12 day 2 neulasta  ? Hyperlipemia   ? Hypertension   ? Lymphedema   ? right arm wears a sleeve  ? MRSA (methicillin resistant staph aureus) culture positive   ? 08/2011  ? Neuromuscular disorder ([place])   ? peripheral neuropathy hands feet  ? OSA (obstructive sleep apnea)   ? CPAP settings- ?   ? Persistent atrial fibrillation ([place])   ? Pituitary abnormality ([place]) 05-25-13  ? small pituitary growth-appears stable- Dr. Christella Noa follows  ? ? ?Current Outpatient Medications  ?Medication Sig Dispense Refill  ? acetaminophen (TYLENOL) 325 MG tablet Take 325-650 mg by mouth every 6 (six) hours as needed (for pain.).    ? albuterol (PROAIR HFA) 108 (90 Base) MCG/ACT inhaler Inhale 1-2 puffs into the lungs every 6 (six) hours as needed for wheezing or shortness of breath. For shortness of breath 18 g 3  ? cabergoline (DOSTINEX) 0.5 MG tablet Take 0.5 mg by mouth 2 (two) times a week. Tuesday and Thursday    ? Cholecalciferol (VITAMIN D3) 1000 UNITS CAPS Take 1,000 Units by mouth every other day.     ? empagliflozin (JARDIANCE) 10 MG TABS tablet Take 1 tablet (10 mg total) by mouth daily. 30 tablet 0  ? furosemide (LASIX) 40 MG tablet Take 1 tablet (40 mg total) by mouth 2 (two) times daily. 60 tablet 2  ? GLUCOSAMINE HCL PO Take 1 tablet by mouth daily.    ? hydroxypropyl methylcellulose / hypromellose (ISOPTO TEARS / GONIOVISC) 2.5 % ophthalmic solution Place 1 drop into both eyes 3 (three) times daily as needed for dry eyes.    ?  metoprolol succinate (TOPROL-XL) 50 MG 24 hr tablet TAKE ONE AND ONE-HALF TABLETS TWICE A DAY 270 tablet 3  ? Multiple Vitamin (MULITIVITAMIN WITH MINERALS) TABS Take 1 tablet by mouth daily with breakfast.     ? potassium chloride SA (KLOR-CON M) 20 MEQ tablet Take 2 tablets (40 mEq total) by mouth daily. Take 2 tablets by mouth daily 60 tablet 0  ? SIMBRINZA 1-0.2 % SUSP Place 1 drop into both eyes 3 (three) times daily.     ? simvastatin (ZOCOR) 20 MG tablet TAKE 1 TABLET AT BEDTIME (Patient taking differently: Take 20 mg by mouth daily at 6 PM.) 90 tablet 3  ? vitamin B-12 (CYANOCOBALAMIN) 500 MCG tablet Take 500 mcg by mouth daily.    ?  XARELTO 20 MG TABS tablet TAKE 1 TABLET DAILY WITH SUPPER 90 tablet 3  ? ?No current facility-administered medications for this encounter.  ? ?Facility-Administered Medications Ordered in Other Encounters  ?Medication Dose Route Frequency Provider Last Rate Last Admin  ? lidocaine (cardiac) 100 mg/31m (XYLOCAINE) 20 MG/ML injection 2%    Anesthesia Intra-op JMontel Clock CRNA   60 mg at 06/02/17 1343  ? ? ?Allergies  ?Allergen Reactions  ? Trandolapril-Verapamil Hcl Er Swelling  ?  Causes lips to swell ([name] is the name brand) ?Tolerates amlodipine  ? Trandolapril-Verapamil Hcl Er   ? ? ?  ?Social History  ? ?Socioeconomic History  ? Marital status: Married  ?  Spouse name: Not on file  ? Number of children: Not on file  ? Years of education: Not on file  ? Highest education level: Not on file  ?Occupational History  ? Not on file  ?Tobacco Use  ? Smoking status: Never  ? Smokeless tobacco: Never  ?Vaping Use  ? Vaping Use: Never used  ?Substance and Sexual Activity  ? Alcohol use: No  ? Drug use: No  ? Sexual activity: Yes  ?  Birth control/protection: Post-menopausal, Surgical  ?Other Topics Concern  ? Not on file  ?Social History Narrative  ? Not on file  ? ?Social Determinants of Health  ? ?Financial Resource Strain: Not on file  ?Food Insecurity: Not on file   ?Transportation Needs: Not on file  ?Physical Activity: Not on file  ?Stress: Not on file  ?Social Connections: Not on file  ?Intimate Partner Violence: Not on file  ? ? ?  ?Family History  ?Problem Relation Age of Onset

## 2021-08-21 NOTE — Discharge Summary (Signed)
Physician Discharge Summary  ?YARELLI DECELLES HWE:[id number] DOB: 10/21/1950 DOA: 08/05/2021 ? ?PCP: Stephens Shire, MD ? ?Admit date: 08/05/2021 ?Discharge date: 08/10/2021 ? ?Time spent: 45 minutes ? ?Recommendations for Outpatient Follow-up:  ?Cardiology in 2 weeks, please check BMP at follow-up ?Please repeat LFTs in 2 weeks ? ? ?Discharge Diagnoses:  ?Principal Problem: ?Acute diastolic CHF ?Pulmonary hypertension ?  Acute respiratory failure with hypoxia ([place]) ?  Essential hypertension ?  Chronic atrial fibrillation (HCC) ?  Hyperbilirubinemia ?  Diarrhea ?  Obstructive sleep apnea ?  Hypokalemia ?  Dyslipidemia ?  Pituitary macroadenoma ([place]) ?  Breast cancer of lower-inner quadrant of right female breast ([place]) ?  Class 3 obesity (HCC) ? ? ?Discharge Condition: Stable ? ?Diet recommendation: Low sodium, heart healthy ? ?Filed Weights  ? 08/08/21 0418 08/09/21 0500 08/10/21 0500  ?Weight: 129.6 kg 126.3 kg 126.8 kg  ? ? ?History of present illness:  ?71/F with history of right breast cancer status postmastectomy, chemo, XRT, atrial fibrillation, hypertension, obesity, dyslipidemia presented to the ED with dyspnea orthopnea and lower extremity edema. ?-Further work-up noted EF of 65-70%, moderate LVH, moderate MR, severe TR ? ?Hospital Course:  ? ?Acute diastolic CHF  ?Right heart failure, MR, Severe TR ?Pulm Hypertension ?-Patient presented with volume overload, dyspnea orthopnea and edema ?-Echo with preserved EF, 65-70%, moderate LVH, mild reduction in RV systolic function, moderate PAH, moderate MR and severe TR, will request Cards input ?-Has been diuresing well, she is 10 L negative, weight down 9 kg ?-Creatinine bumped slightly, subsequently stabilized, discharged home on Lasix 40 Mg twice daily, SGLT2i, follow-up with cardiology in 1 to 2 weeks, consider resuming Aldactone ?-ACE inhibitor allergy noted, we did not trial Entresto ?  ?Acute respiratory failure with hypoxia ([place]) ?-Secondary to above, also  has OSA/OHS ?-Weaned off O2 today, continue nocturnal CPAP ?  ?Acute kidney injury ?-Mild, creatinine bumped to 1.42 days ago now improved to 1.1 at discharge ?  ?Hyperbilirubinemia ?-Chronically elevated since 2013 with baseline around 2, higher on admission, slowly improving, suspect worsened by congestive hepatomegaly ?US liver in 2021 with no focal lesions, post cholecystectomy with no biliary duct dilatation.  ?-No abdominal symptoms, bilirubin trending down ?  ?Chronic atrial fibrillation (HCC) ?-Rate controlled, continue apixaban, metoprolol ?  ?Essential hypertension ?-BP soft, holding amlodipine and losartan, see discussion above ?  ?Hypokalemia ?-Replaced ?  ?Obstructive sleep apnea ?Cpap at night  ?  ?Dyslipidemia ?Continue with statin therapy.  ?  ?Pituitary macroadenoma ([place]) ?Continue cabergoline. Follows with neurosurgery Dr. Christella Noa. ?  ?Breast cancer of lower-inner quadrant of right female breast ([place]) ?- Has right mastectomy s/p chemo, radiation and antiestrogen therapy in remission ?-follows with Dr. Burr Medico oncology ?  ?Class 3 obesity (HCC) ?Calculated BMI is 54,5 ? ?Discharge Exam: ?Vitals:  ? 08/09/21 2203 08/10/21 0500  ?BP: 126/64 111/71  ?Pulse: 81 74  ?Resp: 15 16  ?Temp: 98 ?F (36.7 ?C) 98.5 ?F (36.9 ?C)  ?SpO2: 99% 97%  ? ?General exam: Obese pleasant female sitting up in bed, AAOx3, no distress ?HEENT: Neck obese unable to assess JVD ?CVS: S1-S2, regular rate rhythm ?Lungs: Poor air movement, decreased at the bases ?Abdomen: Soft, obese, not tender, lateral abdominal wall edema ?Extremities: 1+ edema, extent especially in lateral upper thigh and flank, chronic venous stasis changes, small superficial wounds  ?Psychiatry: Judgement and insight appear normal. Mood & affect appropriate.  ? ? ?Discharge Instructions ? ? ?General exam: Obese pleasant female sitting up in bed, AAOx3,  no distress ?HEENT: Neck obese unable to assess JVD ?CVS: S1-S2, regular rate rhythm ?Lungs: Poor air movement,  decreased at the bases ?Abdomen: Soft, obese, not tender, lateral abdominal wall edema ?Extremities: 1+ edema, extent especially in lateral upper thigh and flank, chronic venous stasis changes, small superficial wounds  ?Psychiatry: Judgement and insight appear normal. Mood & affect appropriate.  ? ?Allergies as of 08/10/2021   ? ?   Reactions  ? Trandolapril-verapamil Hcl Er Swelling  ? Causes lips to swell [name]" is the name brand) ?Tolerates amlodipine  ? Trandolapril-verapamil Hcl Er   ? ?  ? ?  ?Medication List  ?  ? ?STOP taking these medications   ? ?amLODipine 2.5 MG tablet ?Commonly known as: NORVASC ?  ?furosemide 40 MG tablet ?Commonly known as: LASIX ?  ?losartan 100 MG tablet ?Commonly known as: COZAAR ?  ? ?  ? ?TAKE these medications   ? ?acetaminophen 325 MG tablet ?Commonly known as: TYLENOL ?Take 325-650 mg by mouth every 6 (six) hours as needed (for pain.). ?  ?albuterol 108 (90 Base) MCG/ACT inhaler ?Commonly known as: ProAir HFA ?Inhale 1-2 puffs into the lungs every 6 (six) hours as needed for wheezing or shortness of breath. For shortness of breath ?  ?cabergoline 0.5 MG tablet ?Commonly known as: DOSTINEX ?Take 0.5 mg by mouth 2 (two) times a week. Tuesday and Thursday ?  ?GLUCOSAMINE HCL PO ?Take 1 tablet by mouth daily. ?  ?hydroxypropyl methylcellulose / hypromellose 2.5 % ophthalmic solution ?Commonly known as: ISOPTO TEARS / GONIOVISC ?Place 1 drop into both eyes 3 (three) times daily as needed for dry eyes. ?  ?metoprolol succinate 50 MG 24 hr tablet ?Commonly known as: TOPROL-XL ?TAKE ONE AND ONE-HALF TABLETS TWICE A DAY ?  ?multivitamin with minerals Tabs tablet ?Take 1 tablet by mouth daily with breakfast. ?  ?potassium chloride SA 20 MEQ tablet ?Commonly known as: KLOR-CON M ?Take 2 tablets (40 mEq total) by mouth daily. Take 2 tablets by mouth daily ?What changed:  ?how much to take ?how to take this ?when to take this ?additional instructions ?  ?Simbrinza 1-0.2 % Susp ?Generic  drug: Brinzolamide-Brimonidine ?Place 1 drop into both eyes 3 (three) times daily. ?  ?simvastatin 20 MG tablet ?Commonly known as: ZOCOR ?TAKE 1 TABLET AT BEDTIME ?What changed: when to take this ?  ?vitamin B-12 500 MCG tablet ?Commonly known as: CYANOCOBALAMIN ?Take 500 mcg by mouth daily. ?  ?Vitamin D3 25 MCG (1000 UT) Caps ?Take 1,000 Units by mouth every other day. ?  ?Xarelto 20 MG Tabs tablet ?Generic drug: rivaroxaban ?TAKE 1 TABLET DAILY WITH SUPPER ?  ? ?  ? ?Allergies  ?Allergen Reactions  ? Trandolapril-Verapamil Hcl Er Swelling  ?  Causes lips to swell [name]" is the name brand) ?Tolerates amlodipine  ? Trandolapril-Verapamil Hcl Er   ? ? Follow-up Information   ? ? Care, [facility] Follow up.   ?Specialty: Home Health Services ?Why: A representative from [facility] will contact you to arrange start date and time for your therapy. ?Contact information: ?[address] ?[address] ?[place] [address] ?[phone] ? ? ?  ?  ? ?  ?  ? ?  ? ? ? ?The results of significant diagnostics from this hospitalization (including imaging, microbiology, ancillary and laboratory) are listed below for reference.   ? ?Significant Diagnostic Studies: ?DG Chest 2 View ? ?Result Date: 08/05/2021 ?CLINICAL DATA:  Shortness of breath EXAM: CHEST - 2  VIEW COMPARISON:  03/28/2020 FINDINGS: Gross cardiomegaly. Bilateral heterogeneous and interstitial pulmonary opacity. Disc degenerative disease of the thoracic spine. IMPRESSION: Cardiomegaly with bilateral heterogeneous and interstitial pulmonary opacity, likely edema. Electronically Signed   By: Delanna Ahmadi M.D.   On: 08/05/2021 16:55  ? ?ECHOCARDIOGRAM COMPLETE ? ?Result Date: 08/06/2021 ?   ECHOCARDIOGRAM REPORT   Patient Name:   Johnston Ebbs Date of Exam: 08/06/2021 Medical Rec #:  [id number]       Height:       62.0 in Accession #:    [id number]      Weight:       298.1 lb Date of Birth:  September 10, 1950       BSA:          2.265 m? Patient Age:    71 years         BP:           133/85 mmHg Patient Gender: F               HR:           86 bpm. Exam Location:  Inpatient Procedure: 2D Echo Indications:    Acute systolic CHF  History:        Patient has prior hi

## 2021-08-26 ENCOUNTER — Other Ambulatory Visit: Payer: Medicare Other | Admitting: *Deleted

## 2021-08-26 ENCOUNTER — Ambulatory Visit (INDEPENDENT_AMBULATORY_CARE_PROVIDER_SITE_OTHER): Payer: Medicare Other | Admitting: Pharmacist

## 2021-08-26 VITALS — BP 116/78 | HR 70 | Wt 272.8 lb

## 2021-08-26 DIAGNOSIS — I10 Essential (primary) hypertension: Secondary | ICD-10-CM | POA: Diagnosis not present

## 2021-08-26 DIAGNOSIS — I509 Heart failure, unspecified: Secondary | ICD-10-CM

## 2021-08-26 DIAGNOSIS — I482 Chronic atrial fibrillation, unspecified: Secondary | ICD-10-CM

## 2021-08-26 DIAGNOSIS — G4733 Obstructive sleep apnea (adult) (pediatric): Secondary | ICD-10-CM

## 2021-08-26 LAB — BASIC METABOLIC PANEL
BUN/Creatinine Ratio: 15 (ref 12–28)
BUN: 19 mg/dL (ref 8–27)
CO2: 31 mmol/L — ABNORMAL HIGH (ref 20–29)
Calcium: 10.1 mg/dL (ref 8.7–10.3)
Chloride: 100 mmol/L (ref 96–106)
Creatinine, Ser: 1.26 mg/dL — ABNORMAL HIGH (ref 0.57–1.00)
Glucose: 105 mg/dL — ABNORMAL HIGH (ref 70–99)
Potassium: 3.9 mmol/L (ref 3.5–5.2)
Sodium: 139 mmol/L (ref 134–144)
eGFR: 45 mL/min/{1.73_m2} — ABNORMAL LOW (ref >59)

## 2021-08-26 MED ORDER — EMPAGLIFLOZIN 10 MG PO TABS: 10.0000 mg | ORAL_TABLET | Freq: Every day | ORAL | 3 refills | Status: DC

## 2021-08-26 NOTE — Patient Instructions (Addendum)
Continue furosemide 60mg  twice a day, Jardiance 10mg  daily, metoprolol succinate 75mg  twice a day  ?Follow up with the Advanced Heart Failure clinic ? ?

## 2021-08-26 NOTE — Progress Notes (Signed)
Patient ID: [name]                 DOB: 12-13-1950                      MRN: [id number] ? ? ? ? ?HPI: ?[name] is a 71 y.o. female referred by Dr. Irish Lack to HTN clinic. PMH is significant for of breast CA s/p R mastectomy not currently undergoing chemo or radiation, HLD, HTN, and a small benign pituitary mass, afib, chronic diastolic dysfunction, pulmonary HTN and CKD. When she was seen by Dr. Irish Lack on 1/18 her hyzaar was switched to losartan and furosemide was increased to 230m daily due to swelling. [facility] Furosemide was increased to 638mtwice a day and spironolactone 12.30m24maily was added. She is scheduled for RHC 4/5. Will be followed by AHF clinic. ? ?Patient presents today to CVRR clinic. She has been taking her medications as prescribed. No rx for [facility]actone [facility] She states she is feeling much better after increasing furosemide. She can do all her ADL without feeling worn out. Does get SOB with walking distances. Blood pressure well controlled at home. ? ?Current HTN meds: furosemide 30m41mice a day, Jardiance 10mg68mly, metoprolol succinate 730mg 12me a day  ?Previously tried: losartan, losartan/HCTZ ?BP goal: <130/80 ? ?Family History:  The patient's family history includes Arthritis in her mother; Cancer (age of onset: 70) in58er father.  ? ?Social History: The patient  reports that she has never smoked. She has never used smokeless tobacco. She reports that she does not drink alcohol and does not use drugs.  ? ?Home BP readings: 117/70, 120/70, 116/68 ? ?Wt Readings from Last 3 Encounters:  ?08/20/21 277 lb (125.6 kg)  ?08/10/21 279 lb 9.6 oz (126.8 kg)  ?06/12/21 274 lb (124.3 kg)  ? ?BP Readings from Last 3 Encounters:  ?08/20/21 130/78  ?08/10/21 111/71  ?06/12/21 140/80  ? ?Pulse Readings from Last 3 Encounters:  ?08/20/21 70  ?08/10/21 74  ?06/12/21 75   ? ? ?Renal function: ?Estimated Creatinine Clearance: 54.2 mL/min (A) (by C-G formula based on SCr of 1.19 mg/dL (H)). ? ?Past Medical History:  ?Diagnosis Date  ? Arthritis   ? Asthma   ? Blood transfusion   ? hx of last one in 1987  ? Breast cancer (HCC) [place]  ? Right Breast Cancer  ? Cancer of lower-inner quadrant of female breast (HCC) 2[place]/2013  ? right breast cancer /IDC,stage IIB,er/pr=+,her2=Neg  ? Glaucoma   ? Heart murmur   ? History of chemotherapy   ? taxotere/cytoxan 6 cycles 09/22/11-01/05/12 day 2 neulasta  ? Hyperlipemia   ? Hypertension   ? Lymphedema   ? right arm wears a sleeve  ? MRSA (methicillin resistant staph aureus) culture positive   ? 08/2011  ? Neuromuscular disorder (HCC)  [place] Eyeperipheral neuropathy hands feet  ? OSA (obstructive sleep apnea)   ? CPAP settings- ?   ? Persistent atrial fibrillation (HCC)  [place]it[place] abnormality (HCC) 1[place]1-14  ? small pituitary growth-appears stable- Dr. CabbelChristella Noaws  ? ? ?Current Outpatient Medications on File Prior to Visit  ?Medication Sig Dispense Refill  ? acetaminophen (TYLENOL) 325 MG tablet Take 325-650 mg by mouth every 6 (six) hours as needed (for pain.).    ? albuterol (PROAIR HFA) 108 (90 Base)  MCG/ACT inhaler Inhale 1-2 puffs into the lungs every 6 (six) hours as needed for wheezing or shortness of breath. For shortness of breath 18 g 3  ? cabergoline (DOSTINEX) 0.5 MG tablet Take 0.5 mg by mouth 2 (two) times a week. Tuesday and Thursday    ? Cholecalciferol (VITAMIN D3) 1000 UNITS CAPS Take 1,000 Units by mouth every other day.     ? empagliflozin (JARDIANCE) 10 MG TABS tablet Take 1 tablet (10 mg total) by mouth daily. 30 tablet 6  ? furosemide (LASIX) 40 MG tablet Take 1.5 tablets (60 mg total) by mouth 2 (two) times daily. 90 tablet 2  ? GLUCOSAMINE HCL PO Take 1 tablet by mouth daily.    ? hydroxypropyl methylcellulose / hypromellose (ISOPTO TEARS / GONIOVISC) 2.5 % ophthalmic solution Place 1 drop into both eyes 3 (three) times daily  as needed for dry eyes.    ? metoprolol succinate (TOPROL-XL) 50 MG 24 hr tablet TAKE ONE AND ONE-HALF TABLETS TWICE A DAY 270 tablet 3  ? Multiple Vitamin (MULITIVITAMIN WITH MINERALS) TABS Take 1 tablet by mouth daily with breakfast.     ? potassium chloride SA (KLOR-CON M) 20 MEQ tablet Take 2 tablets (40 mEq total) by mouth daily. Take 2 tablets by mouth daily 60 tablet 0  ? SIMBRINZA 1-0.2 % SUSP Place 1 drop into both eyes 3 (three) times daily.     ? simvastatin (ZOCOR) 20 MG tablet TAKE 1 TABLET AT BEDTIME (Patient taking differently: Take 20 mg by mouth daily at 6 PM.) 90 tablet 3  ? vitamin B-12 (CYANOCOBALAMIN) 500 MCG tablet Take 500 mcg by mouth daily.    ? XARELTO 20 MG TABS tablet TAKE 1 TABLET DAILY WITH SUPPER 90 tablet 3  ? ?Current Facility-Administered Medications on File Prior to Visit  ?Medication Dose Route Frequency Provider Last Rate Last Admin  ? lidocaine (cardiac) 100 mg/43m (XYLOCAINE) 20 MG/ML injection 2%    Anesthesia Intra-op JMontel Clock CRNA   60 mg at 06/02/17 1343  ? ? ?Allergies  ?Allergen Reactions  ? Trandolapril-Verapamil Hcl Er Swelling  ?  Causes lips to swell ([name] is the name brand) ?Tolerates amlodipine  ? Trandolapril-Verapamil Hcl Er   ? ? ?There were no vitals taken for this visit. ? ? ?Assessment/Plan: ? ?1. Hypertension - Blood pressure well controlled <130/80. I will send a message to Brittainy to follow up on spironolactone. BMP was checked today. Continue furosemide 6106mtwice a day, Jardiance 1045maily and metoprolol succinate 56m36mice a day. Follow up with AHF clinic. ? ? ?Thank you ? ?MeliRamond Dialarm.D, BCPS, CPP ?[id number] [place] [id number]: [phone]: [phone]? ?

## 2021-08-27 ENCOUNTER — Telehealth: Payer: Self-pay | Admitting: Pharmacist

## 2021-08-27 NOTE — Telephone Encounter (Signed)
Called patient to review labs. Labs stable, no med changes needed.  ?

## 2021-08-28 ENCOUNTER — Ambulatory Visit (HOSPITAL_COMMUNITY)
Admission: RE | Admit: 2021-08-28 | Discharge: 2021-08-28 | Disposition: A | Discharge status: Home / Self Care | Payer: Medicare Other | Attending: Cardiology | Admitting: Cardiology

## 2021-08-28 ENCOUNTER — Encounter (HOSPITAL_COMMUNITY)
Admission: RE | Disposition: A | Discharge status: Home / Self Care | Payer: Self-pay | Source: Home / Self Care | Attending: Cardiology

## 2021-08-28 ENCOUNTER — Other Ambulatory Visit: Payer: Self-pay

## 2021-08-28 DIAGNOSIS — N1832 Chronic kidney disease, stage 3b: Secondary | ICD-10-CM | POA: Insufficient documentation

## 2021-08-28 DIAGNOSIS — Z7901 Long term (current) use of anticoagulants: Secondary | ICD-10-CM | POA: Diagnosis not present

## 2021-08-28 DIAGNOSIS — I5032 Chronic diastolic (congestive) heart failure: Secondary | ICD-10-CM | POA: Diagnosis not present

## 2021-08-28 DIAGNOSIS — I083 Combined rheumatic disorders of mitral, aortic and tricuspid valves: Secondary | ICD-10-CM | POA: Insufficient documentation

## 2021-08-28 DIAGNOSIS — Z853 Personal history of malignant neoplasm of breast: Secondary | ICD-10-CM | POA: Insufficient documentation

## 2021-08-28 DIAGNOSIS — G4733 Obstructive sleep apnea (adult) (pediatric): Secondary | ICD-10-CM | POA: Diagnosis not present

## 2021-08-28 DIAGNOSIS — I130 Hypertensive heart and chronic kidney disease with heart failure and stage 1 through stage 4 chronic kidney disease, or unspecified chronic kidney disease: Secondary | ICD-10-CM | POA: Insufficient documentation

## 2021-08-28 DIAGNOSIS — I272 Pulmonary hypertension, unspecified: Secondary | ICD-10-CM

## 2021-08-28 DIAGNOSIS — I4819 Other persistent atrial fibrillation: Secondary | ICD-10-CM | POA: Insufficient documentation

## 2021-08-28 DIAGNOSIS — Z79899 Other long term (current) drug therapy: Secondary | ICD-10-CM | POA: Insufficient documentation

## 2021-08-28 DIAGNOSIS — I509 Heart failure, unspecified: Secondary | ICD-10-CM | POA: Diagnosis not present

## 2021-08-28 DIAGNOSIS — Z7984 Long term (current) use of oral hypoglycemic drugs: Secondary | ICD-10-CM | POA: Insufficient documentation

## 2021-08-28 HISTORY — PX: RIGHT HEART CATH: CATH118263

## 2021-08-28 LAB — BASIC METABOLIC PANEL
Anion gap: 7 (ref 5–15)
BUN: 17 mg/dL (ref 8–23)
CO2: 28 mmol/L (ref 22–32)
Calcium: 9.3 mg/dL (ref 8.9–10.3)
Chloride: 106 mmol/L (ref 98–111)
Creatinine, Ser: 1.21 mg/dL — ABNORMAL HIGH (ref 0.44–1.00)
GFR, Estimated: 48 mL/min — ABNORMAL LOW (ref >60)
Glucose, Bld: 100 mg/dL — ABNORMAL HIGH (ref 70–99)
Potassium: 3.6 mmol/L (ref 3.5–5.1)
Sodium: 141 mmol/L (ref 135–145)

## 2021-08-28 LAB — CBC
HCT: 36.6 % (ref 36.0–46.0)
Hemoglobin: 11.6 g/dL — ABNORMAL LOW (ref 12.0–15.0)
MCH: 31.4 pg (ref 26.0–34.0)
MCHC: 31.7 g/dL (ref 30.0–36.0)
MCV: 98.9 fL (ref 80.0–100.0)
Platelets: 95 10*3/uL — ABNORMAL LOW (ref 150–400)
RBC: 3.7 MIL/uL — ABNORMAL LOW (ref 3.87–5.11)
RDW: 14.9 % (ref 11.5–15.5)
WBC: 5.6 10*3/uL (ref 4.0–10.5)
nRBC: 0 % (ref 0.0–0.2)

## 2021-08-28 LAB — POCT I-STAT EG7
Acid-Base Excess: 1 mmol/L (ref 0.0–2.0)
Acid-Base Excess: 2 mmol/L (ref 0.0–2.0)
Bicarbonate: 27.3 mmol/L (ref 20.0–28.0)
Bicarbonate: 27.8 mmol/L (ref 20.0–28.0)
Calcium, Ion: 1.2 mmol/L (ref 1.15–1.40)
Calcium, Ion: 1.24 mmol/L (ref 1.15–1.40)
HCT: 36 % (ref 36.0–46.0)
HCT: 37 % (ref 36.0–46.0)
Hemoglobin: 12.2 g/dL (ref 12.0–15.0)
Hemoglobin: 12.6 g/dL (ref 12.0–15.0)
O2 Saturation: 65 %
O2 Saturation: 67 %
Potassium: 3.6 mmol/L (ref 3.5–5.1)
Potassium: 3.7 mmol/L (ref 3.5–5.1)
Sodium: 140 mmol/L (ref 135–145)
Sodium: 141 mmol/L (ref 135–145)
TCO2: 29 mmol/L (ref 22–32)
TCO2: 29 mmol/L (ref 22–32)
pCO2, Ven: 46.9 mmHg (ref 44–60)
pCO2, Ven: 47.3 mmHg (ref 44–60)
pH, Ven: 7.373 (ref 7.25–7.43)
pH, Ven: 7.376 (ref 7.25–7.43)
pO2, Ven: 35 mmHg (ref 32–45)
pO2, Ven: 36 mmHg (ref 32–45)

## 2021-08-28 SURGERY — RIGHT HEART CATH
Anesthesia: LOCAL

## 2021-08-28 MED ORDER — SODIUM CHLORIDE 0.9% FLUSH: 3.0000 mL | Freq: Two times a day (BID) | INTRAVENOUS | Status: DC

## 2021-08-28 MED ORDER — ONDANSETRON HCL 4 MG/2ML IJ SOLN
4.0000 mg | Freq: Four times a day (QID) | INTRAMUSCULAR | Status: DC | PRN
Start: 2021-08-28 — End: 2021-08-28

## 2021-08-28 MED ORDER — SODIUM CHLORIDE 0.9 % IV SOLN: 250.0000 mL | INTRAVENOUS | Status: DC | PRN

## 2021-08-28 MED ORDER — LIDOCAINE HCL (PF) 1 % IJ SOLN
INTRAMUSCULAR | Status: AC
  Filled 2021-08-28: qty 30

## 2021-08-28 MED ORDER — HYDRALAZINE HCL 20 MG/ML IJ SOLN
INTRAMUSCULAR | Status: DC | PRN
  Administered 2021-08-28: 10 mg via INTRAVENOUS

## 2021-08-28 MED ORDER — LABETALOL HCL 5 MG/ML IV SOLN: 10.0000 mg | INTRAVENOUS | Status: DC | PRN

## 2021-08-28 MED ORDER — SODIUM CHLORIDE 0.9% FLUSH
3.0000 mL | INTRAVENOUS | Status: DC | PRN
Start: 2021-08-28 — End: 2021-08-28

## 2021-08-28 MED ORDER — ASPIRIN 81 MG PO CHEW
81.0000 mg | CHEWABLE_TABLET | ORAL | Status: AC
  Administered 2021-08-28: 81 mg via ORAL
  Filled 2021-08-28: qty 1

## 2021-08-28 MED ORDER — HYDRALAZINE HCL 20 MG/ML IJ SOLN: 10.0000 mg | INTRAMUSCULAR | Status: DC | PRN

## 2021-08-28 MED ORDER — ACETAMINOPHEN 325 MG PO TABS: 650.0000 mg | ORAL_TABLET | ORAL | Status: DC | PRN

## 2021-08-28 MED ORDER — LIDOCAINE HCL (PF) 1 % IJ SOLN
INTRAMUSCULAR | Status: DC | PRN
  Administered 2021-08-28: 2 mL via INTRADERMAL

## 2021-08-28 MED ORDER — SODIUM CHLORIDE 0.9% FLUSH: 3.0000 mL | INTRAVENOUS | Status: DC | PRN

## 2021-08-28 MED ORDER — HEPARIN (PORCINE) IN NACL 1000-0.9 UT/500ML-% IV SOLN
INTRAVENOUS | Status: DC | PRN
  Administered 2021-08-28: 500 mL

## 2021-08-28 MED ORDER — METOPROLOL SUCCINATE ER 50 MG PO TB24
75.0000 mg | ORAL_TABLET | Freq: Once | ORAL | Status: AC
  Administered 2021-08-28: 75 mg via ORAL
  Filled 2021-08-28: qty 1

## 2021-08-28 MED ORDER — METOPROLOL SUCCINATE ER 25 MG PO TB24
75.0000 mg | ORAL_TABLET | Freq: Every day | ORAL | Status: DC
Start: 2021-08-28 — End: 2021-08-28

## 2021-08-28 MED ORDER — SODIUM CHLORIDE 0.9 % IV SOLN: INTRAVENOUS | Status: DC

## 2021-08-28 MED ORDER — FUROSEMIDE 40 MG PO TABS: 80.0000 mg | ORAL_TABLET | Freq: Two times a day (BID) | ORAL | 2 refills | Status: DC

## 2021-08-28 MED ORDER — HEPARIN (PORCINE) IN NACL 1000-0.9 UT/500ML-% IV SOLN
INTRAVENOUS | Status: AC
  Filled 2021-08-28: qty 500

## 2021-08-28 SURGICAL SUPPLY — 5 items
CATH BALLN WEDGE 5F 110CM (CATHETERS) ×1 IMPLANT
KIT HEART LEFT (KITS) ×3 IMPLANT
PACK CARDIAC CATHETERIZATION (CUSTOM PROCEDURE TRAY) ×3 IMPLANT
SHEATH GLIDE SLENDER 4/5FR (SHEATH) ×1 IMPLANT
TRANSDUCER W/STOPCOCK (MISCELLANEOUS) ×3 IMPLANT

## 2021-08-28 NOTE — Interval H&P Note (Signed)
History and Physical Interval Note: ? ?08/28/2021 ?1:41 PM ? ?DAKISHA SCHOOF  has presented today for surgery, with the diagnosis of hp.  The various methods of treatment have been discussed with the patient and family. After consideration of risks, benefits and other options for treatment, the patient has consented to  Procedure(s): ?RIGHT HEART CATH (N/A) as a surgical intervention.  The patient's history has been reviewed, patient examined, no change in status, stable for surgery.  I have reviewed the patient's chart and labs.  Questions were answered to the patient's satisfaction.   ? ? ?Taira Knabe Aundra Dubin ? ? ?

## 2021-08-28 NOTE — Discharge Instructions (Signed)
Increase Lasix to 80 mg twice daily at home.  ?

## 2021-08-28 NOTE — Progress Notes (Signed)
Unable to obtain 20g ac iv on Left, pt has had mastectomy on right with periodic lyphadema.  [place] notified.  OK to stick right.  Consult in for IV team. ?

## 2021-08-29 ENCOUNTER — Encounter (HOSPITAL_COMMUNITY): Payer: Self-pay | Admitting: Cardiology

## 2021-09-02 ENCOUNTER — Encounter (HOSPITAL_COMMUNITY): Payer: Medicare Other

## 2021-09-02 NOTE — Progress Notes (Incomplete)
? ?ADVANCED HF CLINIC CONSULT NOTE ? ?Referring Physician: ?Primary Care: ?Primary Cardiologist: ? ?HPI: ?71 y/o female w/ chronic diastolic heart failure, RV dysfunction, pulmonary HTN, persistent atrial fibrillation, MR/TR, HTN, HLD, h/o rt sided breast cancer in 2013 tx w/ mastectomy, chemo + radiation , CKD stage IIIb and peripheral neuropathy,  w/ recent admit for a/c diastolic heart failure. Evaluated by cardiology and diuresed w/ IV Lasix. Was noted to be in rate controlled afib, persistent. 2D echo showed hyperdynamic LVEF, 65-70%, D shaped LV c/w RV volume overload, Grade IIDD w/ mod LVH, mildly reduced RV and severely elevated RVSP, 58 mmHg, moderate MR w/ severe BAE and severe TR.  ?  ?After diuresis, was switched back to oral lasix + Jardiance. Continued on Xarelto and metoprolol for afib. Referred to [facility] clinic. D/c Wt 279 lb.  ?  ?Seen in [facility] 3/23. Here w/ husband. Notes subjective improvement w/ less dyspnea but remains fluid overloaded w/ NYHA class II-III symptoms. Denies resting dyspnea. Reports full compliance w/ meds and good UOP w/ PO Lasix. Wt stable since d/c, down 2 lb at 277 lb. Reports full compliance w/ CPAP. Fully compliant w/ Xarelto. Denies h/o PE/DVT. No family h/o HF. Denies CP. Non smoker. + h/o asthma but no h/o COPD. No known h/o CTD.  ?  ?[place] 3/23 showed elevated right and left heart filling pressures, prominent V waves in the PCWP tracing, moderate-severe pulmonary venous hypertension, and preserved cardiac output. Lasix increased to 80 bid. ? ?Today she returns for HF follow up. Overall feeling fine. Denies increasing SOB, CP, dizziness, edema, or PND/Orthopnea. Appetite ok. No fever or chills. Weight at home 170 pounds. Taking all medications.  ? ?ECG (personally reviewed): ? ?Labs (4/23): K 3.6, creatinine 1.21 ?  ?  ?Cardiac Testing  ?- Echo (3/23): EF 65-70%, normal LV, moderate LVH, the interventricular  ?septum is flattened in diastole ('D' shaped left ventricle),  consistent  ?with right ventricular volume overload, RV mildly reduced, moderate MR, severe TR. ?  ?- RHC (4/23): elevated right and left filling pressures, prominent V waves in PCWP tracing, moderate to severe pulmonary venous hypertension, and preserved CO. ? ?RA mean 22 ?RV 69/30 ?PA 69/31, mean 46 ?PCWP mean 30 with V waves to 44 ? ?Oxygen saturations: ?PA 66% ?AO 97% ? ?Cardiac Output (Fick) 6.11  ?Cardiac Index (Fick) 2.82 ?PVR 2.6 WU ? ?Review of Systems: [y] = yes, [ ]  = no  ? ?General: Weight gain [ ] ; Weight loss [ ] ; Anorexia [ ] ; Fatigue [ ] ; Fever [ ] ; Chills [ ] ; Weakness [ ]   ?Cardiac: Chest pain/pressure [ ] ; Resting SOB [ ] ; Exertional SOB [ ] ; Orthopnea [ ] ; Pedal Edema [ ] ; Palpitations [ ] ; Syncope [ ] ; Presyncope [ ] ; Paroxysmal nocturnal dyspnea[ ]   ?Pulmonary: Cough [ ] ; Wheezing[ ] ; Hemoptysis[ ] ; Sputum [ ] ; Snoring [ ]   ?GI: Vomiting[ ] ; Dysphagia[ ] ; Melena[ ] ; Hematochezia [ ] ; Heartburn[ ] ; Abdominal pain [ ] ; Constipation [ ] ; Diarrhea [ ] ; BRBPR [ ]   ?GU: Hematuria[ ] ; Dysuria [ ] ; Nocturia[ ]   ?Vascular: Pain in legs with walking [ ] ; Pain in feet with lying flat [ ] ; Non-healing sores [ ] ; Stroke [ ] ; TIA [ ] ; Slurred speech [ ] ;  ?Neuro: Headaches[ ] ; Vertigo[ ] ; Seizures[ ] ; Paresthesias[ ] ;Blurred vision [ ] ; Diplopia [ ] ; Vision changes [ ]   ?Ortho/Skin: Arthritis [ ] ; Joint pain [ ] ; Muscle pain [ ] ; Joint swelling [ ] ; Back Pain [ ] ;  Rash [ ]   ?Psych: Depression[ ] ; Anxiety[ ]   ?Heme: Bleeding problems [ ] ; Clotting disorders [ ] ; Anemia [ ]   ?Endocrine: Diabetes [ ] ; Thyroid dysfunction[ ]  ? ? ?Past Medical History:  ?Diagnosis Date  ? Arthritis   ? Asthma   ? Blood transfusion   ? hx of last one in 1987  ? Breast cancer ([place]) 2013  ? Right Breast Cancer  ? Cancer of lower-inner quadrant of female breast ([place]) 07/24/2011  ? right breast cancer /IDC,stage IIB,er/pr=+,her2=Neg  ? Glaucoma   ? Heart murmur   ? History of chemotherapy   ? taxotere/cytoxan 6 cycles 09/22/11-01/05/12  day 2 neulasta  ? Hyperlipemia   ? Hypertension   ? Lymphedema   ? right arm wears a sleeve  ? MRSA (methicillin resistant staph aureus) culture positive   ? 08/2011  ? Neuromuscular disorder ([place])   ? peripheral neuropathy hands feet  ? OSA (obstructive sleep apnea)   ? CPAP settings- ?   ? Persistent atrial fibrillation ([place])   ? Pituitary abnormality ([place]) 05-25-13  ? small pituitary growth-appears stable- Dr. Christella Noa follows  ? ? ?Current Outpatient Medications  ?Medication Sig Dispense Refill  ? acetaminophen (TYLENOL) 325 MG tablet Take 325-650 mg by mouth every 6 (six) hours as needed (for pain.).    ? albuterol (PROAIR HFA) 108 (90 Base) MCG/ACT inhaler Inhale 1-2 puffs into the lungs every 6 (six) hours as needed for wheezing or shortness of breath. For shortness of breath 18 g 3  ? cabergoline (DOSTINEX) 0.5 MG tablet Take 0.5 mg by mouth 2 (two) times a week. Tuesday and Thursday    ? Cholecalciferol (VITAMIN D3) 1000 UNITS CAPS Take 1,000 Units by mouth every other day.     ? empagliflozin (JARDIANCE) 10 MG TABS tablet Take 1 tablet (10 mg total) by mouth daily. 90 tablet 3  ? furosemide (LASIX) 40 MG tablet Take 2 tablets (80 mg total) by mouth 2 (two) times daily. 90 tablet 2  ? GLUCOSAMINE HCL PO Take 1 tablet by mouth daily.    ? hydroxypropyl methylcellulose / hypromellose (ISOPTO TEARS / GONIOVISC) 2.5 % ophthalmic solution Place 1 drop into both eyes 3 (three) times daily as needed for dry eyes.    ? metoprolol succinate (TOPROL-XL) 50 MG 24 hr tablet TAKE ONE AND ONE-HALF TABLETS TWICE A DAY 270 tablet 3  ? Multiple Vitamin (MULITIVITAMIN WITH MINERALS) TABS Take 1 tablet by mouth daily with breakfast.     ? potassium chloride SA (KLOR-CON M) 20 MEQ tablet Take 2 tablets (40 mEq total) by mouth daily. Take 2 tablets by mouth daily 60 tablet 0  ? SIMBRINZA 1-0.2 % SUSP Place 1 drop into both eyes 3 (three) times daily.     ? simvastatin (ZOCOR) 20 MG tablet TAKE 1 TABLET AT BEDTIME (Patient taking  differently: Take 20 mg by mouth daily at 6 PM.) 90 tablet 3  ? vitamin B-12 (CYANOCOBALAMIN) 500 MCG tablet Take 500 mcg by mouth daily.    ? XARELTO 20 MG TABS tablet TAKE 1 TABLET DAILY WITH SUPPER 90 tablet 3  ? ?No current facility-administered medications for this visit.  ? ?Facility-Administered Medications Ordered in Other Visits  ?Medication Dose Route Frequency Provider Last Rate Last Admin  ? lidocaine (cardiac) 100 mg/53m (XYLOCAINE) 20 MG/ML injection 2%    Anesthesia Intra-op JMontel Clock CRNA   60 mg at 06/02/17 1343  ? ? ?Allergies  ?Allergen Reactions  ? Trandolapril-Verapamil Hcl Er  Swelling  ?  Causes lips to swell [name]" is the name brand) ?Tolerates amlodipine  ? Trandolapril-Verapamil Hcl Er   ? ? ?  ?Social History  ? ?Socioeconomic History  ? Marital status: Married  ?  Spouse name: Not on file  ? Number of children: Not on file  ? Years of education: Not on file  ? Highest education level: Not on file  ?Occupational History  ? Not on file  ?Tobacco Use  ? Smoking status: Never  ? Smokeless tobacco: Never  ?Vaping Use  ? Vaping Use: Never used  ?Substance and Sexual Activity  ? Alcohol use: No  ? Drug use: No  ? Sexual activity: Yes  ?  Birth control/protection: Post-menopausal, Surgical  ?Other Topics Concern  ? Not on file  ?Social History Narrative  ? Not on file  ? ?Social Determinants of Health  ? ?Financial Resource Strain: Not on file  ?Food Insecurity: Not on file  ?Transportation Needs: Not on file  ?Physical Activity: Not on file  ?Stress: Not on file  ?Social Connections: Not on file  ?Intimate Partner Violence: Not on file  ? ? ?  ?Family History  ?Problem Relation Age of Onset  ? Arthritis Mother   ? Cancer Father 66  ?     prostate ca  ? Breast cancer Neg Hx   ? ? ?There were no vitals filed for this visit. ? ?PHYSICAL EXAM: ?General:  Well appearing. No respiratory difficulty ?HEENT: normal ?Neck: supple. no JVD. Carotids 2+ bilat; no bruits. No lymphadenopathy or  thryomegaly appreciated. ?Cor: PMI nondisplaced. Regular rate & rhythm. No rubs, gallops or murmurs. ?Lungs: clear ?Abdomen: soft, nontender, nondistended. No hepatosplenomegaly. No bruits or masses. Good bowel so

## 2021-09-11 ENCOUNTER — Ambulatory Visit (HOSPITAL_COMMUNITY)
Admission: RE | Admit: 2021-09-11 | Discharge: 2021-09-11 | Disposition: A | Discharge status: Home / Self Care | Payer: Medicare Other | Source: Ambulatory Visit | Attending: Cardiology | Admitting: Cardiology

## 2021-09-11 ENCOUNTER — Encounter (HOSPITAL_COMMUNITY): Payer: Self-pay | Admitting: Cardiology

## 2021-09-11 ENCOUNTER — Other Ambulatory Visit (HOSPITAL_COMMUNITY): Payer: Self-pay

## 2021-09-11 ENCOUNTER — Encounter (HOSPITAL_COMMUNITY): Payer: Medicare Other

## 2021-09-11 VITALS — BP 100/60 | HR 64 | Wt 259.0 lb

## 2021-09-11 DIAGNOSIS — Z853 Personal history of malignant neoplasm of breast: Secondary | ICD-10-CM | POA: Insufficient documentation

## 2021-09-11 DIAGNOSIS — I509 Heart failure, unspecified: Secondary | ICD-10-CM | POA: Diagnosis not present

## 2021-09-11 DIAGNOSIS — G4733 Obstructive sleep apnea (adult) (pediatric): Secondary | ICD-10-CM | POA: Diagnosis not present

## 2021-09-11 DIAGNOSIS — N1832 Chronic kidney disease, stage 3b: Secondary | ICD-10-CM | POA: Insufficient documentation

## 2021-09-11 DIAGNOSIS — I4821 Permanent atrial fibrillation: Secondary | ICD-10-CM | POA: Diagnosis not present

## 2021-09-11 DIAGNOSIS — I482 Chronic atrial fibrillation, unspecified: Secondary | ICD-10-CM

## 2021-09-11 DIAGNOSIS — I081 Rheumatic disorders of both mitral and tricuspid valves: Secondary | ICD-10-CM | POA: Insufficient documentation

## 2021-09-11 DIAGNOSIS — I130 Hypertensive heart and chronic kidney disease with heart failure and stage 1 through stage 4 chronic kidney disease, or unspecified chronic kidney disease: Secondary | ICD-10-CM | POA: Insufficient documentation

## 2021-09-11 DIAGNOSIS — I5032 Chronic diastolic (congestive) heart failure: Secondary | ICD-10-CM | POA: Diagnosis present

## 2021-09-11 DIAGNOSIS — Z79899 Other long term (current) drug therapy: Secondary | ICD-10-CM | POA: Diagnosis not present

## 2021-09-11 DIAGNOSIS — E785 Hyperlipidemia, unspecified: Secondary | ICD-10-CM | POA: Diagnosis not present

## 2021-09-11 DIAGNOSIS — Z7901 Long term (current) use of anticoagulants: Secondary | ICD-10-CM | POA: Diagnosis not present

## 2021-09-11 DIAGNOSIS — J45909 Unspecified asthma, uncomplicated: Secondary | ICD-10-CM | POA: Diagnosis not present

## 2021-09-11 DIAGNOSIS — G629 Polyneuropathy, unspecified: Secondary | ICD-10-CM | POA: Insufficient documentation

## 2021-09-11 DIAGNOSIS — I272 Pulmonary hypertension, unspecified: Secondary | ICD-10-CM | POA: Diagnosis not present

## 2021-09-11 LAB — BASIC METABOLIC PANEL
Anion gap: 11 (ref 5–15)
BUN: 22 mg/dL (ref 8–23)
CO2: 26 mmol/L (ref 22–32)
Calcium: 9.7 mg/dL (ref 8.9–10.3)
Chloride: 102 mmol/L (ref 98–111)
Creatinine, Ser: 1.26 mg/dL — ABNORMAL HIGH (ref 0.44–1.00)
GFR, Estimated: 45 mL/min — ABNORMAL LOW (ref >60)
Glucose, Bld: 101 mg/dL — ABNORMAL HIGH (ref 70–99)
Potassium: 3.3 mmol/L — ABNORMAL LOW (ref 3.5–5.1)
Sodium: 139 mmol/L (ref 135–145)

## 2021-09-11 LAB — BRAIN NATRIURETIC PEPTIDE: B Natriuretic Peptide: 259.2 pg/mL — ABNORMAL HIGH (ref 0.0–100.0)

## 2021-09-11 MED ORDER — TORSEMIDE 20 MG PO TABS
60.0000 mg | ORAL_TABLET | Freq: Two times a day (BID) | ORAL | 3 refills | Status: DC
Start: 2021-09-11 — End: 2021-11-22

## 2021-09-11 NOTE — Progress Notes (Signed)
? ? ? ?Primary Care: Stephens Shire, MD  ?Primary Cardiologist: Larae Grooms, MD ?HF Cardiology: Dr Aundra Dubin ? ?HPI: ?71 y.o. female w/ chronic diastolic heart failure, RV dysfunction, pulmonary HTN, persistent atrial fibrillation, MR/TR, HTN, HLD, h/o rt sided breast cancer in 2013 tx w/ mastectomy, chemo + radiation, CKD stage IIIb and peripheral neuropathy,  w/ recent admit for a/c diastolic heart failure. Evaluated by cardiology and diuresed w/ IV Lasix. Was noted to be in rate controlled afib, persistent. Echo in 3/23 showed hyperdynamic LVEF, 65-70%, D shaped LV c/w RV volume overload, Grade II DD w/ mod LVH, mildly reduced RV and severely elevated RVSP, 58 mmHg, moderate MR w/ severe BAE and severe TR.  ? ?After diuresis, was switched back to oral lasix + Jardiance. Continued on Xarelto and metoprolol for afib. Referred to [facility] clinic. D/c Wt 279 lb. Reports full compliance w/ CPAP. Fully compliant w/ Xarelto. Denies h/o PE/DVT. No family h/o HF. Denies CP. Non smoker. + h/o asthma but no h/o COPD. No known h/o connective tissue disorder.   ? ?RHC was arranged from [facility] clinic, done in 4/23.  This showed markedly elevated right and left heart filling pressures with prominent v-waves in the PCWP tracing.  PVR not elevated, pulmonary venous hypertension.  Lasix was increased.  ? ?Patient returns for followup of CHF.  She has been doing PT at home and is walking with a cane.  She is doing better overall, not short of breath walking in the house though gets winded with longer distances.  2 pillow orthopnea.   No chest pain.  No lightheadedness.  She is in chronic atrial fibrillation.  Weight is down 18 lbs compared to last appointment.  ? ?Labs (4/23): K 3.6, creatinine 1.21, hgb 11.6 ? ?ECG (personally reviewed): atrial fibrillation in 60s with septal Qs ? ?PMH: ?1. CKD stage 3 ?2. OSA: uses CPAP.  ?3. Atrial fibrillation: Chronic.  It appears that she has been in AF since at least 2018.  ?4. Breast cancer  on right in 2013: Taxotere/Cytoxan chemotherapy.  ?5. Peripheral neuropathy.  ?6. Chronic diastolic CHF with prominent RV failure: Echo in 3/23 with EF 65-70%, moderate LVH, D-shaped septum, mild RVE with mildly decreased systolic function, PASP 58, severe biatrial enlargement, at least moderate MR, severe TR, mild-moderate AI, IVC dilated.  ?- RHC (4/23): mean RA 22, PA 69/31 mean 46, mean PCWP 30 with v-waves to 44, CI 2.82, PVR 2.6 WU.  ?7. CKD stage 3 ? ? ?Review of Systems: All systems reviewed and negative except as per HPI.  ? ? ?Current Outpatient Medications  ?Medication Sig Dispense Refill  ? acetaminophen (TYLENOL) 325 MG tablet Take 325-650 mg by mouth every 6 (six) hours as needed (for pain.).    ? albuterol (PROAIR HFA) 108 (90 Base) MCG/ACT inhaler Inhale 1-2 puffs into the lungs every 6 (six) hours as needed for wheezing or shortness of breath. For shortness of breath 18 g 3  ? cabergoline (DOSTINEX) 0.5 MG tablet Take 0.5 mg by mouth 2 (two) times a week. Tuesday and Thursday    ? Cholecalciferol (VITAMIN D3) 1000 UNITS CAPS Take 1,000 Units by mouth every other day.     ? empagliflozin (JARDIANCE) 10 MG TABS tablet Take 1 tablet (10 mg total) by mouth daily. 90 tablet 3  ? GLUCOSAMINE HCL PO Take 1 tablet by mouth daily.    ? hydroxypropyl methylcellulose / hypromellose (ISOPTO TEARS / GONIOVISC) 2.5 % ophthalmic solution Place 1 drop into  both eyes 3 (three) times daily as needed for dry eyes.    ? metoprolol succinate (TOPROL-XL) 50 MG 24 hr tablet TAKE ONE AND ONE-HALF TABLETS TWICE A DAY 270 tablet 3  ? Multiple Vitamin (MULITIVITAMIN WITH MINERALS) TABS Take 1 tablet by mouth daily with breakfast.     ? potassium chloride SA (KLOR-CON M) 20 MEQ tablet Take 2 tablets (40 mEq total) by mouth daily. Take 2 tablets by mouth daily 60 tablet 0  ? SIMBRINZA 1-0.2 % SUSP Place 1 drop into both eyes 3 (three) times daily.     ? simvastatin (ZOCOR) 20 MG tablet TAKE 1 TABLET AT BEDTIME 90 tablet 3  ?  torsemide (DEMADEX) 20 MG tablet Take 3 tablets (60 mg total) by mouth 2 (two) times daily. 540 tablet 3  ? vitamin B-12 (CYANOCOBALAMIN) 500 MCG tablet Take 500 mcg by mouth daily.    ? XARELTO 20 MG TABS tablet TAKE 1 TABLET DAILY WITH SUPPER 90 tablet 3  ? ?No current facility-administered medications for this encounter.  ? ?Facility-Administered Medications Ordered in Other Encounters  ?Medication Dose Route Frequency Provider Last Rate Last Admin  ? lidocaine (cardiac) 100 mg/27ml (XYLOCAINE) 20 MG/ML injection 2%    Anesthesia Intra-op Montel Clock, CRNA   60 mg at 06/02/17 1343  ? ? ?Allergies  ?Allergen Reactions  ? Trandolapril-Verapamil Hcl Er Swelling  ?  Causes lips to swell [name]" is the name brand) ?Tolerates amlodipine  ? Trandolapril-Verapamil Hcl Er   ? ? ?  ?Social History  ? ?Socioeconomic History  ? Marital status: Married  ?  Spouse name: Not on file  ? Number of children: Not on file  ? Years of education: Not on file  ? Highest education level: Not on file  ?Occupational History  ? Not on file  ?Tobacco Use  ? Smoking status: Never  ? Smokeless tobacco: Never  ?Vaping Use  ? Vaping Use: Never used  ?Substance and Sexual Activity  ? Alcohol use: No  ? Drug use: No  ? Sexual activity: Yes  ?  Birth control/protection: Post-menopausal, Surgical  ?Other Topics Concern  ? Not on file  ?Social History Narrative  ? Not on file  ? ?Social Determinants of Health  ? ?Financial Resource Strain: Not on file  ?Food Insecurity: Not on file  ?Transportation Needs: Not on file  ?Physical Activity: Not on file  ?Stress: Not on file  ?Social Connections: Not on file  ?Intimate Partner Violence: Not on file  ? ? ?  ?Family History  ?Problem Relation Age of Onset  ? Arthritis Mother   ? Cancer Father 78  ?     prostate ca  ? Breast cancer Neg Hx   ? ? ?Vitals:  ? 09/11/21 1030  ?BP: 100/60  ?Pulse: 64  ?SpO2: 95%  ?Weight: 117.5 kg (259 lb)  ? ? ?PHYSICAL EXAM: ?General: NAD, obese ?Neck: JVP 10-12 cm, no  thyromegaly or thyroid nodule.  ?Lungs: Clear to auscultation bilaterally with normal respiratory effort. ?CV: Nondisplaced PMI.  Heart regular S1/S2, no S3/S4, 2/6 HSM LLSB/apex.  1+ edema to knees.  No carotid bruit.  Normal pedal pulses.  ?Abdomen: Soft, nontender, no hepatosplenomegaly, no distention.  ?Skin: Intact without lesions or rashes.  ?Neurologic: Alert and oriented x 3.  ?Psych: Normal affect. ?Extremities: No clubbing or cyanosis.  ?HEENT: Normal.  ? ?ASSESSMENT & PLAN: ? ?1. Chronic diastolic CHF with prominent RV dysfunction: Echo in 3/23 with  EF 65-70%, moderate  LVH, D-shaped septum, mild RVE with mildly decreased systolic function, PASP 58, severe biatrial enlargement, at least moderate MR, severe TR, mild-moderate AI, IVC dilated. [place] in 4/23 showed markedly elevated right and left heart filling pressures with prominent v-waves in the PCWP tracing.  PVR not elevated, pulmonary venous hypertension.  LV and RV failure components with valvular heart disease.  Chronic atrial fibrillation may have been a major driver for this.  Would also consider cardiac amyloidosis given atrial fibrillation, diastolic CHF, peripheral neuropathy.  She has lost 18 lbs with augmented diuresis, but still very volume overloaded.  NYHA class III symptoms.  ?- Continue empagliflozin 10 mg daily.  ?- Stop Lasix, start torsemide 60 mg bid.  BMET/BNP today and BMET in 10 days.  ?- No Entresto (h/o angioedema w/ ACEi)  ?- Consider spironolactone in the future.  ?- Continue Metoprolol XL 75 mg bid.  ?- Workup for cardiac amyloidosis: Send myeloma panel, urine immunofixation, and serum free light chains; will arrange for PYP scan.  ?- Need to more closely assess severity of mitral regurgitation (see below).  ?2. Pulmonary hypertension: This appears to be pulmonary venous hypertension.  Treatment will be diuresis.  ?3. Permanent atrial fibrillation: Severe biatrial enlargement, AF present for several years.  Unlikely to  successfully cardiovert even with anti-arrhythmic.  ?- Continue Xarelto.   ?4. OSA: Continue CPAP.  ?5. Mitral Regurgitation: Possible atrial functional MR with severe LAE.  MR not fully evaluated by echo, appear

## 2021-09-11 NOTE — Patient Instructions (Addendum)
Medication Changes: ? ?Stop Lasix (Furosemide ) ? ?Start Torsemide 60 mg (3 Tab ) Twice daily ? ? ?Lab Work: ? ?Labs done today, your results will be available in MyChart, we will contact you for abnormal readings. ? ? ?Testing/Procedures: ? ? ?You are scheduled for a TEE on 09/20/21 with Dr. Dr. Aundra Dubin.  Please arrive at the [facility] (Main Entrance A) at [facility]: [address] [place],  [address] at 7 am. (1 hour prior to procedure unless lab work is needed; if lab work is needed arrive 1.5 hours ahead) ? ?DIET: Nothing to eat or drink after midnight except a sip of water with medications (see medication instructions below) ? ?FYI: For your safety, and to allow [name] to monitor your vital signs accurately during the surgery/procedure we request that   ?if you have artificial nails, gel coating, SNS etc. Please have those removed prior to your surgery/procedure. Not having the nail coverings /polish removed may result in cancellation or delay of your surgery/procedure. ? ? ?Medication Instructions: ? ?Hold Torsemide and Jardiance the morning of the procedure ? ?Labs: Will be taking the morning of procedure  ? ?You must have a responsible person to drive you home and stay in the waiting area during your procedure. Failure to do so could result in cancellation. ? ?Interior and spatial designer cards. ? ?*Special Note: Every effort is made to have your procedure done on time. Occasionally there are emergencies that occur at the hospital that may cause delays. Please be patient if a delay does occur.  ? ? ?You have been ordered a PYP Scan.  This is done in the Radiology Department of [facility].  When you come for this test please plan to be there 2-3 hours. ?ONCE APPROVED BY INSURANCE YOU WILL  BE CALLED TO ARRANGE THE TEST  ? ?Referrals: ? ?none ? ?Special Instructions // Education: ? ?none ? ?Follow-Up in: 2 weeks after your procedure  ? ?At the [facility], you and your health  needs are our priority. We have a designated team specialized in the treatment of Heart Failure. This Care Team includes your primary Heart Failure Specialized Cardiologist (physician), Advanced Practice Providers (APPs- Physician Assistants and Nurse Practitioners), and Pharmacist who all work together to provide you with the care you need, when you need it.  ? ?You may see any of the following providers on your designated Care Team at your next follow up: ? ?Dr Glori Bickers ?Dr Loralie Champagne ?Darrick Grinder, NP ?Lyda Jester, PA ?Jessica Milford,NP ?Marlyce Huge, PA ?Audry Riles, PharmD ? ? ?Please be sure to bring in all your medications bottles to every appointment.  ? ?Need to Contact us: ? ?If you have any questions or concerns before your next appointment please send [name] a message through [place] or call our office at [phone].   ? ?TO LEAVE A MESSAGE FOR THE NURSE SELECT OPTION 2, PLEASE LEAVE A MESSAGE INCLUDING: ?YOUR NAME ?DATE OF BIRTH ?CALL BACK NUMBER ?REASON FOR CALL**this is important as we prioritize the call backs ? ?YOU WILL RECEIVE A CALL BACK THE SAME DAY AS LONG AS YOU CALL BEFORE 4:00 PM ? ? ?

## 2021-09-11 NOTE — H&P (View-Only) (Signed)
? ? ? ?Primary Care: Stephens Shire, MD  ?Primary Cardiologist: Larae Grooms, MD ?HF Cardiology: Dr Aundra Dubin ? ?HPI: ?71 y.o. female w/ chronic diastolic heart failure, RV dysfunction, pulmonary HTN, persistent atrial fibrillation, MR/TR, HTN, HLD, h/o rt sided breast cancer in 2013 tx w/ mastectomy, chemo + radiation, CKD stage IIIb and peripheral neuropathy,  w/ recent admit for a/c diastolic heart failure. Evaluated by cardiology and diuresed w/ IV Lasix. Was noted to be in rate controlled afib, persistent. Echo in 3/23 showed hyperdynamic LVEF, 65-70%, D shaped LV c/w RV volume overload, Grade II DD w/ mod LVH, mildly reduced RV and severely elevated RVSP, 58 mmHg, moderate MR w/ severe BAE and severe TR.  ? ?After diuresis, was switched back to oral lasix + Jardiance. Continued on Xarelto and metoprolol for afib. Referred to [facility] clinic. D/c Wt 279 lb. Reports full compliance w/ CPAP. Fully compliant w/ Xarelto. Denies h/o PE/DVT. No family h/o HF. Denies CP. Non smoker. + h/o asthma but no h/o COPD. No known h/o connective tissue disorder.   ? ?RHC was arranged from [facility] clinic, done in 4/23.  This showed markedly elevated right and left heart filling pressures with prominent v-waves in the PCWP tracing.  PVR not elevated, pulmonary venous hypertension.  Lasix was increased.  ? ?Patient returns for followup of CHF.  She has been doing PT at home and is walking with a cane.  She is doing better overall, not short of breath walking in the house though gets winded with longer distances.  2 pillow orthopnea.   No chest pain.  No lightheadedness.  She is in chronic atrial fibrillation.  Weight is down 18 lbs compared to last appointment.  ? ?Labs (4/23): K 3.6, creatinine 1.21, hgb 11.6 ? ?ECG (personally reviewed): atrial fibrillation in 60s with septal Qs ? ?PMH: ?1. CKD stage 3 ?2. OSA: uses CPAP.  ?3. Atrial fibrillation: Chronic.  It appears that she has been in AF since at least 2018.  ?4. Breast cancer  on right in 2013: Taxotere/Cytoxan chemotherapy.  ?5. Peripheral neuropathy.  ?6. Chronic diastolic CHF with prominent RV failure: Echo in 3/23 with EF 65-70%, moderate LVH, D-shaped septum, mild RVE with mildly decreased systolic function, PASP 58, severe biatrial enlargement, at least moderate MR, severe TR, mild-moderate AI, IVC dilated.  ?- RHC (4/23): mean RA 22, PA 69/31 mean 46, mean PCWP 30 with v-waves to 44, CI 2.82, PVR 2.6 WU.  ?7. CKD stage 3 ? ? ?Review of Systems: All systems reviewed and negative except as per HPI.  ? ? ?Current Outpatient Medications  ?Medication Sig Dispense Refill  ? acetaminophen (TYLENOL) 325 MG tablet Take 325-650 mg by mouth every 6 (six) hours as needed (for pain.).    ? albuterol (PROAIR HFA) 108 (90 Base) MCG/ACT inhaler Inhale 1-2 puffs into the lungs every 6 (six) hours as needed for wheezing or shortness of breath. For shortness of breath 18 g 3  ? cabergoline (DOSTINEX) 0.5 MG tablet Take 0.5 mg by mouth 2 (two) times a week. Tuesday and Thursday    ? Cholecalciferol (VITAMIN D3) 1000 UNITS CAPS Take 1,000 Units by mouth every other day.     ? empagliflozin (JARDIANCE) 10 MG TABS tablet Take 1 tablet (10 mg total) by mouth daily. 90 tablet 3  ? GLUCOSAMINE HCL PO Take 1 tablet by mouth daily.    ? hydroxypropyl methylcellulose / hypromellose (ISOPTO TEARS / GONIOVISC) 2.5 % ophthalmic solution Place 1 drop into  both eyes 3 (three) times daily as needed for dry eyes.    ? metoprolol succinate (TOPROL-XL) 50 MG 24 hr tablet TAKE ONE AND ONE-HALF TABLETS TWICE A DAY 270 tablet 3  ? Multiple Vitamin (MULITIVITAMIN WITH MINERALS) TABS Take 1 tablet by mouth daily with breakfast.     ? potassium chloride SA (KLOR-CON M) 20 MEQ tablet Take 2 tablets (40 mEq total) by mouth daily. Take 2 tablets by mouth daily 60 tablet 0  ? SIMBRINZA 1-0.2 % SUSP Place 1 drop into both eyes 3 (three) times daily.     ? simvastatin (ZOCOR) 20 MG tablet TAKE 1 TABLET AT BEDTIME 90 tablet 3  ?  torsemide (DEMADEX) 20 MG tablet Take 3 tablets (60 mg total) by mouth 2 (two) times daily. 540 tablet 3  ? vitamin B-12 (CYANOCOBALAMIN) 500 MCG tablet Take 500 mcg by mouth daily.    ? XARELTO 20 MG TABS tablet TAKE 1 TABLET DAILY WITH SUPPER 90 tablet 3  ? ?No current facility-administered medications for this encounter.  ? ?Facility-Administered Medications Ordered in Other Encounters  ?Medication Dose Route Frequency Provider Last Rate Last Admin  ? lidocaine (cardiac) 100 mg/7ml (XYLOCAINE) 20 MG/ML injection 2%    Anesthesia Intra-op Montel Clock, CRNA   60 mg at 06/02/17 1343  ? ? ?Allergies  ?Allergen Reactions  ? Trandolapril-Verapamil Hcl Er Swelling  ?  Causes lips to swell [name]" is the name brand) ?Tolerates amlodipine  ? Trandolapril-Verapamil Hcl Er   ? ? ?  ?Social History  ? ?Socioeconomic History  ? Marital status: Married  ?  Spouse name: Not on file  ? Number of children: Not on file  ? Years of education: Not on file  ? Highest education level: Not on file  ?Occupational History  ? Not on file  ?Tobacco Use  ? Smoking status: Never  ? Smokeless tobacco: Never  ?Vaping Use  ? Vaping Use: Never used  ?Substance and Sexual Activity  ? Alcohol use: No  ? Drug use: No  ? Sexual activity: Yes  ?  Birth control/protection: Post-menopausal, Surgical  ?Other Topics Concern  ? Not on file  ?Social History Narrative  ? Not on file  ? ?Social Determinants of Health  ? ?Financial Resource Strain: Not on file  ?Food Insecurity: Not on file  ?Transportation Needs: Not on file  ?Physical Activity: Not on file  ?Stress: Not on file  ?Social Connections: Not on file  ?Intimate Partner Violence: Not on file  ? ? ?  ?Family History  ?Problem Relation Age of Onset  ? Arthritis Mother   ? Cancer Father 85  ?     prostate ca  ? Breast cancer Neg Hx   ? ? ?Vitals:  ? 09/11/21 1030  ?BP: 100/60  ?Pulse: 64  ?SpO2: 95%  ?Weight: 117.5 kg (259 lb)  ? ? ?PHYSICAL EXAM: ?General: NAD, obese ?Neck: JVP 10-12 cm, no  thyromegaly or thyroid nodule.  ?Lungs: Clear to auscultation bilaterally with normal respiratory effort. ?CV: Nondisplaced PMI.  Heart regular S1/S2, no S3/S4, 2/6 HSM LLSB/apex.  1+ edema to knees.  No carotid bruit.  Normal pedal pulses.  ?Abdomen: Soft, nontender, no hepatosplenomegaly, no distention.  ?Skin: Intact without lesions or rashes.  ?Neurologic: Alert and oriented x 3.  ?Psych: Normal affect. ?Extremities: No clubbing or cyanosis.  ?HEENT: Normal.  ? ?ASSESSMENT & PLAN: ? ?1. Chronic diastolic CHF with prominent RV dysfunction: Echo in 3/23 with  EF 65-70%, moderate  LVH, D-shaped septum, mild RVE with mildly decreased systolic function, PASP 58, severe biatrial enlargement, at least moderate MR, severe TR, mild-moderate AI, IVC dilated. Parachute in 4/23 showed markedly elevated right and left heart filling pressures with prominent v-waves in the PCWP tracing.  PVR not elevated, pulmonary venous hypertension.  LV and RV failure components with valvular heart disease.  Chronic atrial fibrillation may have been a major driver for this.  Would also consider cardiac amyloidosis given atrial fibrillation, diastolic CHF, peripheral neuropathy.  She has lost 18 lbs with augmented diuresis, but still very volume overloaded.  NYHA class III symptoms.  ?- Continue empagliflozin 10 mg daily.  ?- Stop Lasix, start torsemide 60 mg bid.  BMET/BNP today and BMET in 10 days.  ?- No Entresto (h/o angioedema w/ ACEi)  ?- Consider spironolactone in the future.  ?- Continue Metoprolol XL 75 mg bid.  ?- Workup for cardiac amyloidosis: Send myeloma panel, urine immunofixation, and serum free light chains; will arrange for PYP scan.  ?- Need to more closely assess severity of mitral regurgitation (see below).  ?2. Pulmonary hypertension: This appears to be pulmonary venous hypertension.  Treatment will be diuresis.  ?3. Permanent atrial fibrillation: Severe biatrial enlargement, AF present for several years.  Unlikely to  successfully cardiovert even with anti-arrhythmic.  ?- Continue Xarelto.   ?4. OSA: Continue CPAP.  ?5. Mitral Regurgitation: Possible atrial functional MR with severe LAE.  MR not fully evaluated by echo, appear

## 2021-09-12 ENCOUNTER — Telehealth (HOSPITAL_COMMUNITY): Payer: Self-pay | Admitting: Cardiology

## 2021-09-12 MED ORDER — POTASSIUM CHLORIDE CRYS ER 20 MEQ PO TBCR: 60.0000 meq | EXTENDED_RELEASE_TABLET | Freq: Every day | ORAL | 3 refills | Status: DC

## 2021-09-12 NOTE — Telephone Encounter (Signed)
Patient called.  Patient aware.  

## 2021-09-12 NOTE — Telephone Encounter (Signed)
-----   Message from Larey Dresser, MD sent at 09/11/2021  1:11 PM EDT ----- ?Increase total daily KCl by 20 mEq.  ?

## 2021-09-13 ENCOUNTER — Encounter (HOSPITAL_COMMUNITY): Payer: Self-pay | Admitting: Cardiology

## 2021-09-13 LAB — MULTIPLE MYELOMA PANEL, SERUM
Albumin SerPl Elph-Mcnc: 3.9 g/dL (ref 2.9–4.4)
Albumin/Glob SerPl: 1.3 (ref 0.7–1.7)
Alpha 1: 0.2 g/dL (ref 0.0–0.4)
Alpha2 Glob SerPl Elph-Mcnc: 0.7 g/dL (ref 0.4–1.0)
B-Globulin SerPl Elph-Mcnc: 1.1 g/dL (ref 0.7–1.3)
Gamma Glob SerPl Elph-Mcnc: 1.1 g/dL (ref 0.4–1.8)
Globulin, Total: 3.2 g/dL (ref 2.2–3.9)
IgA: 249 mg/dL (ref 64–422)
IgG (Immunoglobin G), Serum: 1210 mg/dL (ref 586–1602)
IgM (Immunoglobulin M), Srm: 130 mg/dL (ref 26–217)
Total Protein ELP: 7.1 g/dL (ref 6.0–8.5)

## 2021-09-13 LAB — IMMUNOFIXATION, URINE

## 2021-09-20 ENCOUNTER — Ambulatory Visit (HOSPITAL_COMMUNITY): Payer: Medicare Other | Admitting: Anesthesiology

## 2021-09-20 ENCOUNTER — Ambulatory Visit (HOSPITAL_COMMUNITY)
Admission: RE | Admit: 2021-09-20 | Discharge: 2021-09-20 | Disposition: A | Discharge status: Home / Self Care | Payer: Medicare Other | Attending: Cardiology | Admitting: Cardiology

## 2021-09-20 ENCOUNTER — Other Ambulatory Visit: Payer: Self-pay

## 2021-09-20 ENCOUNTER — Ambulatory Visit (HOSPITAL_BASED_OUTPATIENT_CLINIC_OR_DEPARTMENT_OTHER)
Admission: RE | Admit: 2021-09-20 | Discharge: 2021-09-20 | Disposition: A | Discharge status: Home / Self Care | Payer: Medicare Other | Source: Ambulatory Visit | Attending: Cardiology | Admitting: Cardiology

## 2021-09-20 ENCOUNTER — Encounter (HOSPITAL_COMMUNITY)
Admission: RE | Disposition: A | Discharge status: Home / Self Care | Payer: Self-pay | Source: Home / Self Care | Attending: Cardiology

## 2021-09-20 ENCOUNTER — Ambulatory Visit (HOSPITAL_BASED_OUTPATIENT_CLINIC_OR_DEPARTMENT_OTHER): Payer: Medicare Other | Admitting: Anesthesiology

## 2021-09-20 ENCOUNTER — Encounter (HOSPITAL_COMMUNITY): Payer: Self-pay | Admitting: Cardiology

## 2021-09-20 DIAGNOSIS — I340 Nonrheumatic mitral (valve) insufficiency: Secondary | ICD-10-CM | POA: Diagnosis present

## 2021-09-20 DIAGNOSIS — Z9010 Acquired absence of unspecified breast and nipple: Secondary | ICD-10-CM | POA: Insufficient documentation

## 2021-09-20 DIAGNOSIS — I110 Hypertensive heart disease with heart failure: Secondary | ICD-10-CM | POA: Diagnosis not present

## 2021-09-20 DIAGNOSIS — Q2112 Patent foramen ovale: Secondary | ICD-10-CM | POA: Insufficient documentation

## 2021-09-20 DIAGNOSIS — Z853 Personal history of malignant neoplasm of breast: Secondary | ICD-10-CM | POA: Diagnosis not present

## 2021-09-20 DIAGNOSIS — Z923 Personal history of irradiation: Secondary | ICD-10-CM | POA: Insufficient documentation

## 2021-09-20 DIAGNOSIS — G629 Polyneuropathy, unspecified: Secondary | ICD-10-CM | POA: Diagnosis not present

## 2021-09-20 DIAGNOSIS — E785 Hyperlipidemia, unspecified: Secondary | ICD-10-CM | POA: Insufficient documentation

## 2021-09-20 DIAGNOSIS — I4821 Permanent atrial fibrillation: Secondary | ICD-10-CM | POA: Insufficient documentation

## 2021-09-20 DIAGNOSIS — Z9221 Personal history of antineoplastic chemotherapy: Secondary | ICD-10-CM | POA: Insufficient documentation

## 2021-09-20 DIAGNOSIS — I083 Combined rheumatic disorders of mitral, aortic and tricuspid valves: Secondary | ICD-10-CM | POA: Insufficient documentation

## 2021-09-20 DIAGNOSIS — N1832 Chronic kidney disease, stage 3b: Secondary | ICD-10-CM | POA: Diagnosis not present

## 2021-09-20 DIAGNOSIS — I5032 Chronic diastolic (congestive) heart failure: Secondary | ICD-10-CM | POA: Diagnosis not present

## 2021-09-20 DIAGNOSIS — I272 Pulmonary hypertension, unspecified: Secondary | ICD-10-CM | POA: Diagnosis not present

## 2021-09-20 DIAGNOSIS — G4733 Obstructive sleep apnea (adult) (pediatric): Secondary | ICD-10-CM | POA: Insufficient documentation

## 2021-09-20 DIAGNOSIS — I130 Hypertensive heart and chronic kidney disease with heart failure and stage 1 through stage 4 chronic kidney disease, or unspecified chronic kidney disease: Secondary | ICD-10-CM | POA: Diagnosis not present

## 2021-09-20 DIAGNOSIS — Z7901 Long term (current) use of anticoagulants: Secondary | ICD-10-CM | POA: Insufficient documentation

## 2021-09-20 DIAGNOSIS — J45909 Unspecified asthma, uncomplicated: Secondary | ICD-10-CM | POA: Diagnosis not present

## 2021-09-20 DIAGNOSIS — I509 Heart failure, unspecified: Secondary | ICD-10-CM | POA: Diagnosis not present

## 2021-09-20 DIAGNOSIS — Z7984 Long term (current) use of oral hypoglycemic drugs: Secondary | ICD-10-CM | POA: Diagnosis not present

## 2021-09-20 HISTORY — PX: TEE WITHOUT CARDIOVERSION: SHX5443

## 2021-09-20 HISTORY — PX: BUBBLE STUDY: SHX6837

## 2021-09-20 SURGERY — ECHOCARDIOGRAM, TRANSESOPHAGEAL
Anesthesia: Monitor Anesthesia Care

## 2021-09-20 MED ORDER — PROPOFOL 500 MG/50ML IV EMUL
INTRAVENOUS | Status: DC | PRN
  Administered 2021-09-20: 100 ug/kg/min via INTRAVENOUS
  Administered 2021-09-20: 75 ug/kg/min via INTRAVENOUS

## 2021-09-20 MED ORDER — PROPOFOL 10 MG/ML IV BOLUS
INTRAVENOUS | Status: DC | PRN
  Administered 2021-09-20 (×2): 10 mg via INTRAVENOUS

## 2021-09-20 MED ORDER — SODIUM CHLORIDE 0.9 % IV SOLN
INTRAVENOUS | Status: DC
  Administered 2021-09-20: 500 mL via INTRAVENOUS

## 2021-09-20 NOTE — Interval H&P Note (Signed)
History and Physical Interval Note: ? ?09/20/2021 ?9:19 AM ? ?[name]  has presented today for surgery, with the diagnosis of MITRO REGURG.  The various methods of treatment have been discussed with the patient and family. After consideration of risks, benefits and other options for treatment, the patient has consented to  Procedure(s): ?TRANSESOPHAGEAL ECHOCARDIOGRAM (TEE) (N/A) as a surgical intervention.  The patient's history has been reviewed, patient examined, no change in status, stable for surgery.  I have reviewed the patient's chart and labs.  Questions were answered to the patient's satisfaction.   ? ? ?Zada Haser Aundra Dubin ? ? ?

## 2021-09-20 NOTE — Anesthesia Postprocedure Evaluation (Signed)
Anesthesia Post Note ? ?Patient: [name] ? ?Procedure(s) Performed: TRANSESOPHAGEAL ECHOCARDIOGRAM (TEE) ?BUBBLE STUDY ? ?  ? ?Patient location during evaluation: PACU ?Anesthesia Type: MAC ?Level of consciousness: awake and alert ?Pain management: pain level controlled ?Vital Signs Assessment: post-procedure vital signs reviewed and stable ?Respiratory status: spontaneous breathing, nonlabored ventilation and respiratory function stable ?Cardiovascular status: blood pressure returned to baseline ?Postop Assessment: no apparent nausea or vomiting ?Anesthetic complications: no ? ? ?No notable events documented. ? ?Last Vitals:  ?Vitals:  ? 09/20/21 1005 09/20/21 1020  ?BP: (!) 109/54 125/68  ?Pulse: 60 66  ?Resp: (!) 29 15  ?Temp: 37.1 ?C   ?SpO2: 98% 100%  ?  ?Last Pain:  ?Vitals:  ? 09/20/21 1020  ?TempSrc:   ?PainSc: 0-No pain  ? ? ?  ?  ?  ?  ?  ?  ? ?Marthenia Rolling ? ? ? ? ?

## 2021-09-20 NOTE — Transfer of Care (Signed)
Immediate Anesthesia Transfer of Care Note ? ?Patient: [name] ? ?Procedure(s) Performed: TRANSESOPHAGEAL ECHOCARDIOGRAM (TEE) ?BUBBLE STUDY ? ?Patient Location: PACU ? ?Anesthesia Type:MAC ? ?Level of Consciousness: awake, alert  and oriented ? ?Airway & Oxygen Therapy: Patient Spontanous Breathing ? ?Post-op Assessment: Report given to RN, Post -op Vital signs reviewed and stable and Patient moving all extremities X 4 ? ?Post vital signs: Reviewed and stable ? ?Last Vitals:  ?Vitals Value Taken Time  ?BP 109/54 09/20/21 1006  ?Temp    ?Pulse 65 09/20/21 1007  ?Resp 22 09/20/21 1007  ?SpO2 98 % 09/20/21 1007  ?Vitals shown include unvalidated device data. ? ?Last Pain:  ?Vitals:  ? 09/20/21 0805  ?TempSrc: Oral  ?PainSc: 0-No pain  ?   ? ?  ? ?Complications: No notable events documented. ?

## 2021-09-20 NOTE — CV Procedure (Signed)
Procedure: TEE ? ?Indication: Mitral regurgitation  ? ?Sedation: Per anesthesiology ? ?Findings: Please see echo section for full report.  Normal LV size with moderate LV hypertrophy.  EF 60-65%, no regional wall motion abnormalities.  Mildly D-shaped interventricular septum suggestive of a degree of RV pressure/volume overload. Mildly dilated RV with mildly decreased systolic function.  Moderate-severe TR with peak RV-RA gradient 34 mmHg.  There was systolic flow reversal in the hepatic vein doppler pattern.  Dilated IVC.  Severe left atrial enlargement, no LA appendage thrombus.  There was systolic flattening but not flow reversal in the PV doppler pattern.  Severe right atrial enlargement.  There was a PFO by color doppler and bubble study.  Trileaflet aortic valve with mild aortic insufficiency, no stenosis.  No mitral stenosis, moderate mitral stenosis with restriction of the posterior leaflet.  ERO 0.13 cm^2 by PISA, vena contract area 0.21 cm^2. Normal caliber thoracic aorta with mild plaque.  ? ?[name] ?09/20/2021 ?10:10 AM ? ?

## 2021-09-20 NOTE — Anesthesia Preprocedure Evaluation (Addendum)
Anesthesia Evaluation  ?Patient identified by MRN, date of birth, ID band ?Patient awake ? ? ? ?Reviewed: ?Allergy & Precautions, NPO status , Patient's Chart, lab work & pertinent test results, reviewed documented beta blocker date and time  ? ?History of Anesthesia Complications ?Negative for: history of anesthetic complications ? ?Airway ?Mallampati: II ? ?TM Distance: >3 FB ?Neck ROM: Full ? ? ? Dental ? ?(+) Missing,  ?  ?Pulmonary ?asthma , sleep apnea ,  ?  ?Pulmonary exam normal ? ? ? ? ? ? ? Cardiovascular ?hypertension, Pt. on home beta blockers and Pt. on medications ?+CHF  ?Normal cardiovascular exam ? ?TTE 07/2021: EF 65-70%, moderate LVH, septum is flattened in diastole ('D' shaped left ventricle), consistent  ?with right ventricular volume overload, RV systolic  function is mildly reduced, mild RVE, moderately elevated  ?PASP 57.8 mmHg, severe LAE/RAE, moderate MR, severe TR, mild to moderate AR, borderline dilatation of the ascending aorta measuring 38 mm   ?  ?Neuro/Psych ?negative neurological ROS ? negative psych ROS  ? GI/Hepatic ?negative GI ROS, Neg liver ROS,   ?Endo/Other  ?negative endocrine ROS ? Renal/GU ?Renal InsufficiencyRenal disease (Cr 1.26)  ?negative genitourinary ?  ?Musculoskeletal ? ?(+) Arthritis ,  ? Abdominal ?  ?Peds ? Hematology ?negative hematology ROS ?(+)   ?Anesthesia Other Findings ?Day of surgery medications reviewed with patient. ? Reproductive/Obstetrics ?negative OB ROS ? ?  ? ? ? ? ? ? ? ? ? ? ? ? ? ?  ?  ? ? ? ? ? ? ? ?Anesthesia Physical ?Anesthesia Plan ? ?ASA: 4 ? ?Anesthesia Plan: MAC  ? ?Post-op Pain Management: Minimal or no pain anticipated  ? ?Induction:  ? ?PONV Risk Score and Plan: Treatment may vary due to age or medical condition and Propofol infusion ? ?Airway Management Planned: Natural Airway and Nasal Cannula ? ?Additional Equipment: None ? ?Intra-op Plan:  ? ?Post-operative Plan:  ? ?Informed Consent: I have  reviewed the patients History and Physical, chart, labs and discussed the procedure including the risks, benefits and alternatives for the proposed anesthesia with the patient or authorized representative who has indicated his/her understanding and acceptance.  ? ? ? ? ? ?Plan Discussed with: CRNA ? ?Anesthesia Plan Comments:   ? ? ? ? ? ?Anesthesia Quick Evaluation ? ?

## 2021-09-20 NOTE — Anesthesia Procedure Notes (Signed)
Procedure Name: [place] ?Date/Time: 09/20/2021 9:30 AM ?Performed by: Harden Mo, CRNA ?Pre-anesthesia Checklist: Patient identified, Emergency Drugs available, Suction available and Patient being monitored ?Patient Re-evaluated:Patient Re-evaluated prior to induction ?Oxygen Delivery Method: Nasal cannula ?Preoxygenation: Pre-oxygenation with 100% oxygen ?Induction Type: IV induction ?Placement Confirmation: positive ETCO2 and breath sounds checked- equal and bilateral ?Dental Injury: Teeth and Oropharynx as per pre-operative assessment  ? ? ? ? ?

## 2021-10-02 ENCOUNTER — Telehealth (HOSPITAL_COMMUNITY): Payer: Self-pay

## 2021-10-02 NOTE — Telephone Encounter (Signed)
Called to confirm/remind patient of their appointment at the [facility] on 10/03/21.  ? ?Patient reminded to bring all medications and/or complete list. ? ?Confirmed patient has transportation. Gave directions, instructed to utilize [facility] parking. ? ?Confirmed appointment prior to ending call.  ? ?

## 2021-10-02 NOTE — Telephone Encounter (Deleted)
error 

## 2021-10-02 NOTE — Telephone Encounter (Deleted)
Ca

## 2021-10-03 ENCOUNTER — Telehealth (HOSPITAL_COMMUNITY): Payer: Self-pay

## 2021-10-03 ENCOUNTER — Ambulatory Visit (HOSPITAL_COMMUNITY)
Admission: RE | Admit: 2021-10-03 | Discharge: 2021-10-03 | Disposition: A | Discharge status: Home / Self Care | Payer: Medicare Other | Source: Ambulatory Visit | Attending: Cardiology | Admitting: Cardiology

## 2021-10-03 VITALS — BP 106/68 | HR 67 | Wt 253.8 lb

## 2021-10-03 DIAGNOSIS — Z9989 Dependence on other enabling machines and devices: Secondary | ICD-10-CM | POA: Insufficient documentation

## 2021-10-03 DIAGNOSIS — N183 Chronic kidney disease, stage 3 unspecified: Secondary | ICD-10-CM | POA: Insufficient documentation

## 2021-10-03 DIAGNOSIS — I130 Hypertensive heart and chronic kidney disease with heart failure and stage 1 through stage 4 chronic kidney disease, or unspecified chronic kidney disease: Secondary | ICD-10-CM | POA: Diagnosis not present

## 2021-10-03 DIAGNOSIS — I5032 Chronic diastolic (congestive) heart failure: Secondary | ICD-10-CM | POA: Insufficient documentation

## 2021-10-03 DIAGNOSIS — Z7984 Long term (current) use of oral hypoglycemic drugs: Secondary | ICD-10-CM | POA: Diagnosis not present

## 2021-10-03 DIAGNOSIS — E785 Hyperlipidemia, unspecified: Secondary | ICD-10-CM | POA: Insufficient documentation

## 2021-10-03 DIAGNOSIS — I4819 Other persistent atrial fibrillation: Secondary | ICD-10-CM | POA: Diagnosis not present

## 2021-10-03 DIAGNOSIS — I071 Rheumatic tricuspid insufficiency: Secondary | ICD-10-CM | POA: Diagnosis not present

## 2021-10-03 DIAGNOSIS — I340 Nonrheumatic mitral (valve) insufficiency: Secondary | ICD-10-CM | POA: Insufficient documentation

## 2021-10-03 DIAGNOSIS — Z7901 Long term (current) use of anticoagulants: Secondary | ICD-10-CM | POA: Insufficient documentation

## 2021-10-03 DIAGNOSIS — I272 Pulmonary hypertension, unspecified: Secondary | ICD-10-CM | POA: Diagnosis not present

## 2021-10-03 DIAGNOSIS — G4733 Obstructive sleep apnea (adult) (pediatric): Secondary | ICD-10-CM | POA: Diagnosis not present

## 2021-10-03 DIAGNOSIS — Z79899 Other long term (current) drug therapy: Secondary | ICD-10-CM | POA: Diagnosis not present

## 2021-10-03 LAB — BASIC METABOLIC PANEL
Anion gap: 9 (ref 5–15)
BUN: 21 mg/dL (ref 8–23)
CO2: 28 mmol/L (ref 22–32)
Calcium: 9.7 mg/dL (ref 8.9–10.3)
Chloride: 104 mmol/L (ref 98–111)
Creatinine, Ser: 1.3 mg/dL — ABNORMAL HIGH (ref 0.44–1.00)
GFR, Estimated: 44 mL/min — ABNORMAL LOW (ref >60)
Glucose, Bld: 99 mg/dL (ref 70–99)
Potassium: 3.6 mmol/L (ref 3.5–5.1)
Sodium: 141 mmol/L (ref 135–145)

## 2021-10-03 LAB — BRAIN NATRIURETIC PEPTIDE: B Natriuretic Peptide: 229.3 pg/mL — ABNORMAL HIGH (ref 0.0–100.0)

## 2021-10-03 MED ORDER — SPIRONOLACTONE 25 MG PO TABS: 12.5000 mg | ORAL_TABLET | Freq: Every day | ORAL | 3 refills | Status: DC

## 2021-10-03 NOTE — Telephone Encounter (Signed)
Patient advised and verbalized understanding. New Rx sent into patients pharmacy. Patient has pending lab appt already scheduled in one week.  ? ?Meds ordered this encounter  ?Medications  ? spironolactone (ALDACTONE) 25 MG tablet  ?  Sig: Take 0.5 tablets (12.5 mg total) by mouth daily.  ?  Dispense:  45 tablet  ?  Refill:  3  ? ? ?

## 2021-10-03 NOTE — Telephone Encounter (Signed)
-----   Message from Consuelo Pandy, [place] sent at 10/03/2021  1:55 PM EDT ----- ?BNP (fluid marker) still elevated. SCr/K stable. ? ?Start Spironolactone 12.5 mg daily. Repeat BMP in 7 days  ?

## 2021-10-03 NOTE — Progress Notes (Signed)
? ? ? ?Primary Care: Stephens Shire, MD  ?Primary Cardiologist: Larae Grooms, MD ?HF Cardiology: Dr Aundra Dubin ? ?HPI: ?71 y.o. female w/ chronic diastolic heart failure, RV dysfunction, pulmonary HTN, persistent atrial fibrillation, MR/TR, HTN, HLD, h/o rt sided breast cancer in 2013 tx w/ mastectomy, chemo + radiation, CKD stage IIIb and peripheral neuropathy,  w/ recent admit for a/c diastolic heart failure. Evaluated by cardiology and diuresed w/ IV Lasix. Was noted to be in rate controlled afib, persistent. Echo in 3/23 showed hyperdynamic LVEF, 65-70%, D shaped LV c/w RV volume overload, Grade II DD w/ mod LVH, mildly reduced RV and severely elevated RVSP, 58 mmHg, moderate MR w/ severe BAE and severe TR.  ? ?After diuresis, was switched back to oral lasix + Jardiance. Continued on Xarelto and metoprolol for afib. Referred to [facility] clinic. D/c Wt 279 lb. Reports full compliance w/ CPAP. Fully compliant w/ Xarelto. Denies h/o PE/DVT. No family h/o HF. Denies CP. Non smoker. + h/o asthma but no h/o COPD. No known h/o connective tissue disorder.   ? ?RHC was arranged from [facility] clinic, done in 4/23.  This showed markedly elevated right and left heart filling pressures with prominent v-waves in the PCWP tracing.  PVR not elevated, pulmonary venous hypertension.  Lasix was increased.  ? ?She was seen post cath 4/23 and wt was down 18 lb but remained fluid overloaded. Lasix was stopped. Switched to torsemide 60 mg bid. W/u ordered to screen for amyloid. Multiple Myeloma negative for M spike protein. Immunofixation unremarkable. PYP scan ordered. Not completed yet (scheduled tomorrow). She was also set up for TEE to better assess her mitral valve. This showed moderate MR w/ restriction of the posterior leaflet. ERO  0.13 cm^2 by PISA, vena contract area 0.21 cm^2. Also w/ mod-severe TR, normal LVEF 60-65% + mildly D-shaped interventricular septum suggestive of  RV pressure/volume overload. ? ?She returns today for  f/u. Here w/ husband. Wt down another 6 lb. But still w/ mild volume overload. Feeling better. Less SOB working w/ HHPT. Denies resting dyspnea. BP well controlled.  ? ? ?Labs (4/23): K 3.6, creatinine 1.21, hgb 11.6 ?Labs (4/23): Multiple Myeloma no M-spike protein, Immunofixation negative  ? ?ECG (personally reviewed): chronic atrial fibrillation 66 bpm ? ?PMH: ?1. CKD stage 3 ?2. OSA: uses CPAP.  ?3. Atrial fibrillation: Chronic.  It appears that she has been in AF since at least 2018.  ?4. Breast cancer on right in 2013: Taxotere/Cytoxan chemotherapy.  ?5. Peripheral neuropathy.  ?6. Chronic diastolic CHF with prominent RV failure: Echo in 3/23 with EF 65-70%, moderate LVH, D-shaped septum, mild RVE with mildly decreased systolic function, PASP 58, severe biatrial enlargement, at least moderate MR, severe TR, mild-moderate AI, IVC dilated.  ?- RHC (4/23): mean RA 22, PA 69/31 mean 46, mean PCWP 30 with v-waves to 44, CI 2.82, PVR 2.6 WU.  ?7. CKD stage 3 ? ? ?Review of Systems: All systems reviewed and negative except as per HPI.  ? ? ?Current Outpatient Medications  ?Medication Sig Dispense Refill  ? acetaminophen (TYLENOL) 325 MG tablet Take 325-650 mg by mouth every 6 (six) hours as needed (for pain.).    ? albuterol (PROAIR HFA) 108 (90 Base) MCG/ACT inhaler Inhale 1-2 puffs into the lungs every 6 (six) hours as needed for wheezing or shortness of breath. For shortness of breath 18 g 3  ? cabergoline (DOSTINEX) 0.5 MG tablet Take 0.5 mg by mouth 2 (two) times a week. Tuesday and  Thursday    ? Cholecalciferol (VITAMIN D3) 1000 UNITS CAPS Take 1,000 Units by mouth every other day.     ? dorzolamide (TRUSOPT) 2 % ophthalmic solution Place 1 drop into both eyes 2 (two) times daily.    ? doxycycline (VIBRAMYCIN) 100 MG capsule Take 100 mg by mouth 2 (two) times daily.    ? empagliflozin (JARDIANCE) 10 MG TABS tablet Take 1 tablet (10 mg total) by mouth daily. 90 tablet 3  ? GLUCOSAMINE HCL PO Take 1 tablet by  mouth daily.    ? latanoprost (XALATAN) 0.005 % ophthalmic solution Place 1 drop into both eyes at bedtime.    ? metoprolol succinate (TOPROL-XL) 50 MG 24 hr tablet TAKE ONE AND ONE-HALF TABLETS TWICE A DAY 270 tablet 3  ? Multiple Vitamin (MULITIVITAMIN WITH MINERALS) TABS Take 1 tablet by mouth daily with breakfast.     ? potassium chloride SA (KLOR-CON M) 20 MEQ tablet Take 3 tablets (60 mEq total) by mouth daily. 90 tablet 3  ? prednisoLONE acetate (PRED FORTE) 1 % ophthalmic suspension Place 1 drop into the right eye daily.    ? predniSONE (DELTASONE) 20 MG tablet Take one pill tid x 3 then one pill bid x 3 then one pill daily x 3 then off    ? simvastatin (ZOCOR) 20 MG tablet TAKE 1 TABLET AT BEDTIME 90 tablet 3  ? torsemide (DEMADEX) 20 MG tablet Take 3 tablets (60 mg total) by mouth 2 (two) times daily. 540 tablet 3  ? vitamin B-12 (CYANOCOBALAMIN) 500 MCG tablet Take 500 mcg by mouth daily.    ? XARELTO 20 MG TABS tablet TAKE 1 TABLET DAILY WITH SUPPER 90 tablet 3  ? ?No current facility-administered medications for this encounter.  ? ?Facility-Administered Medications Ordered in Other Encounters  ?Medication Dose Route Frequency Provider Last Rate Last Admin  ? lidocaine (cardiac) 100 mg/56m (XYLOCAINE) 20 MG/ML injection 2%    Anesthesia Intra-op JMontel Clock CRNA   60 mg at 06/02/17 1343  ? ? ?Allergies  ?Allergen Reactions  ? Trandolapril-Verapamil Hcl Er Swelling  ?  Causes lips to swell ([name] is the name brand) ?Tolerates amlodipine  ? ? ?  ?Social History  ? ?Socioeconomic History  ? Marital status: Married  ?  Spouse name: Not on file  ? Number of children: Not on file  ? Years of education: Not on file  ? Highest education level: Not on file  ?Occupational History  ? Not on file  ?Tobacco Use  ? Smoking status: Never  ? Smokeless tobacco: Never  ?Vaping Use  ? Vaping Use: Never used  ?Substance and Sexual Activity  ? Alcohol use: No  ? Drug use: No  ? Sexual activity: Yes  ?  Birth  control/protection: Post-menopausal, Surgical  ?Other Topics Concern  ? Not on file  ?Social History Narrative  ? Not on file  ? ?Social Determinants of Health  ? ?Financial Resource Strain: Not on file  ?Food Insecurity: Not on file  ?Transportation Needs: Not on file  ?Physical Activity: Not on file  ?Stress: Not on file  ?Social Connections: Not on file  ?Intimate Partner Violence: Not on file  ? ? ?  ?Family History  ?Problem Relation Age of Onset  ? Arthritis Mother   ? Cancer Father 733 ?     prostate ca  ? Breast cancer Neg Hx   ? ? ?Vitals:  ? 10/03/21 1022  ?BP: 106/68  ?Pulse: 67  ?  SpO2: 99%  ?Weight: 115.1 kg (253 lb 12.8 oz)  ? ? ?PHYSICAL EXAM: ? ?General:  Well appearing, obese in [place]. No respiratory difficulty ?HEENT: normal ?Neck: supple. JVD ~8 cm. Carotids 2+ bilat; no bruits. No lymphadenopathy or thyromegaly appreciated. ?Cor: PMI nondisplaced. Irregularly irregular rhythm. 2/6 HSM at LLSB and apex ?Lungs: clear ?Abdomen: obese, soft, nontender, nondistended. No hepatosplenomegaly. No bruits or masses. Good bowel sounds. ?Extremities: no cyanosis, clubbing, rash, trace b/l pretibial edema ?Neuro: alert & oriented x 3, cranial nerves grossly intact. moves all 4 extremities w/o difficulty. Affect pleasant. ? ? ? ?ASSESSMENT & PLAN: ? ?1. Chronic diastolic CHF with prominent RV dysfunction: Echo in 3/23 with  EF 65-70%, moderate LVH, D-shaped septum, mild RVE with mildly decreased systolic function, PASP 58, severe biatrial enlargement, at least moderate MR, severe TR, mild-moderate AI, IVC dilated. [place] in 4/23 showed markedly elevated right and left heart filling pressures with prominent v-waves in the PCWP tracing.  PVR not elevated, pulmonary venous hypertension.  LV and RV failure components with valvular heart disease.  Chronic atrial fibrillation may have been a major driver for this. Planning PYP study tomorrow to screen for cardiac amyloidosis given atrial fibrillation, diastolic CHF,  peripheral neuropathy. Multiple Myeloma study and immunofixation both normal. She has diuresed well w/ recent diuretic increase. Down 24 lb since RHC w/ improvement in functional status, [place] class II. Remains mildly fl

## 2021-10-03 NOTE — Patient Instructions (Addendum)
Thank you for coming in today  ? ?Labs were done today, if any labs are abnormal the clinic will call you ?No news is good news ? ?Your physician recommends that you schedule a follow-up appointment in:  ?4 weeks in clinic ? ?At the [facility], you and your health needs are our priority. As part of our continuing mission to provide you with exceptional heart care, we have created designated Provider Care Teams. These Care Teams include your primary Cardiologist (physician) and Advanced Practice Providers (APPs- Physician Assistants and Nurse Practitioners) who all work together to provide you with the care you need, when you need it.  ? ?You may see any of the following providers on your designated Care Team at your next follow up: ?Dr Glori Bickers ?Dr Loralie Champagne ?Darrick Grinder, NP ?Lyda Jester, PA ?Jessica Milford,NP ?Marlyce Huge, PA ?Audry Riles, PharmD ? ? ?Please be sure to bring in all your medications bottles to every appointment.  ? ?If you have any questions or concerns before your next appointment please send [name] a message through [place] or call our office at [phone].   ? ?TO LEAVE A MESSAGE FOR THE NURSE SELECT OPTION 2, PLEASE LEAVE A MESSAGE INCLUDING: ?YOUR NAME ?DATE OF BIRTH ?CALL BACK NUMBER ?REASON FOR CALL**this is important as we prioritize the call backs ? ?YOU WILL RECEIVE A CALL BACK THE SAME DAY AS LONG AS YOU CALL BEFORE 4:00 PM ? ?

## 2021-10-04 ENCOUNTER — Ambulatory Visit (HOSPITAL_COMMUNITY)
Admission: RE | Admit: 2021-10-04 | Discharge: 2021-10-04 | Disposition: A | Discharge status: Home / Self Care | Payer: Medicare Other | Source: Ambulatory Visit | Attending: Cardiology | Admitting: Cardiology

## 2021-10-04 ENCOUNTER — Encounter (HOSPITAL_COMMUNITY)
Admission: RE | Admit: 2021-10-04 | Discharge: 2021-10-04 | Disposition: A | Discharge status: Home / Self Care | Payer: Medicare Other | Source: Ambulatory Visit | Attending: Cardiology | Admitting: Cardiology

## 2021-10-04 DIAGNOSIS — I509 Heart failure, unspecified: Secondary | ICD-10-CM

## 2021-10-04 DIAGNOSIS — I130 Hypertensive heart and chronic kidney disease with heart failure and stage 1 through stage 4 chronic kidney disease, or unspecified chronic kidney disease: Secondary | ICD-10-CM | POA: Diagnosis not present

## 2021-10-04 MED ORDER — TECHNETIUM TC 99M PYROPHOSPHATE
21.3000 | Freq: Once | INTRAVENOUS | Status: AC | PRN
  Administered 2021-10-04: 21.3 via INTRAVENOUS
  Filled 2021-10-04: qty 22

## 2021-10-09 ENCOUNTER — Telehealth (HOSPITAL_COMMUNITY): Payer: Self-pay

## 2021-10-09 DIAGNOSIS — I5032 Chronic diastolic (congestive) heart failure: Secondary | ICD-10-CM

## 2021-10-09 NOTE — Telephone Encounter (Signed)
Patient's Cardiac MRI order has been placed and her lab appointment was already scheduled. Will check on PA and schedule. ? ?Pt aware, agreeable, and verbalized understanding  ?

## 2021-10-09 NOTE — Telephone Encounter (Signed)
-----   Message from Larey Dresser, MD sent at 10/04/2021  4:19 PM EDT ----- ?Concerning for cardiac amyloidosis.  Would like to get cardiac MRI to help confirm, also gene testing for TTR amyloidosis.  ?

## 2021-10-11 ENCOUNTER — Ambulatory Visit (HOSPITAL_COMMUNITY)
Admission: RE | Admit: 2021-10-11 | Discharge: 2021-10-11 | Disposition: A | Discharge status: Home / Self Care | Payer: Medicare Other | Source: Ambulatory Visit | Attending: Internal Medicine | Admitting: Internal Medicine

## 2021-10-11 DIAGNOSIS — I5032 Chronic diastolic (congestive) heart failure: Secondary | ICD-10-CM | POA: Insufficient documentation

## 2021-10-11 DIAGNOSIS — I509 Heart failure, unspecified: Secondary | ICD-10-CM

## 2021-10-11 LAB — CBC
HCT: 42.8 % (ref 36.0–46.0)
Hemoglobin: 13.2 g/dL (ref 12.0–15.0)
MCH: 30.3 pg (ref 26.0–34.0)
MCHC: 30.8 g/dL (ref 30.0–36.0)
MCV: 98.4 fL (ref 80.0–100.0)
Platelets: 141 10*3/uL — ABNORMAL LOW (ref 150–400)
RBC: 4.35 MIL/uL (ref 3.87–5.11)
RDW: 13.3 % (ref 11.5–15.5)
WBC: 5.6 10*3/uL (ref 4.0–10.5)
nRBC: 0 % (ref 0.0–0.2)

## 2021-10-11 LAB — BASIC METABOLIC PANEL
Anion gap: 9 (ref 5–15)
BUN: 25 mg/dL — ABNORMAL HIGH (ref 8–23)
CO2: 25 mmol/L (ref 22–32)
Calcium: 9.9 mg/dL (ref 8.9–10.3)
Chloride: 106 mmol/L (ref 98–111)
Creatinine, Ser: 1.28 mg/dL — ABNORMAL HIGH (ref 0.44–1.00)
GFR, Estimated: 45 mL/min — ABNORMAL LOW (ref >60)
Glucose, Bld: 84 mg/dL (ref 70–99)
Potassium: 4.7 mmol/L (ref 3.5–5.1)
Sodium: 140 mmol/L (ref 135–145)

## 2021-10-21 LAB — ECHO TEE
MV M vel: 4.4 m/s
MV Peak grad: 77.4 mmHg
Radius: 0.5 cm

## 2021-11-05 ENCOUNTER — Telehealth (HOSPITAL_COMMUNITY): Payer: Self-pay

## 2021-11-05 NOTE — Telephone Encounter (Signed)
Called to confirm/remind patient of their appointment at the [facility] on 11/06/21.   Patient reminded to bring all medications and/or complete list.  Confirmed patient has transportation. Gave directions, instructed to utilize [place] parking.  Confirmed appointment prior to ending call.

## 2021-11-06 ENCOUNTER — Ambulatory Visit (HOSPITAL_COMMUNITY)
Admission: RE | Admit: 2021-11-06 | Discharge: 2021-11-06 | Disposition: A | Discharge status: Home / Self Care | Payer: Medicare Other | Source: Ambulatory Visit | Attending: Family Medicine | Admitting: Family Medicine

## 2021-11-06 ENCOUNTER — Encounter (HOSPITAL_COMMUNITY): Payer: Self-pay

## 2021-11-06 VITALS — BP 108/70 | HR 86 | Wt 255.2 lb

## 2021-11-06 DIAGNOSIS — I071 Rheumatic tricuspid insufficiency: Secondary | ICD-10-CM

## 2021-11-06 DIAGNOSIS — I130 Hypertensive heart and chronic kidney disease with heart failure and stage 1 through stage 4 chronic kidney disease, or unspecified chronic kidney disease: Secondary | ICD-10-CM | POA: Diagnosis present

## 2021-11-06 DIAGNOSIS — N1832 Chronic kidney disease, stage 3b: Secondary | ICD-10-CM | POA: Diagnosis not present

## 2021-11-06 DIAGNOSIS — Z853 Personal history of malignant neoplasm of breast: Secondary | ICD-10-CM | POA: Diagnosis not present

## 2021-11-06 DIAGNOSIS — C9000 Multiple myeloma not having achieved remission: Secondary | ICD-10-CM | POA: Diagnosis not present

## 2021-11-06 DIAGNOSIS — I272 Pulmonary hypertension, unspecified: Secondary | ICD-10-CM | POA: Insufficient documentation

## 2021-11-06 DIAGNOSIS — N183 Chronic kidney disease, stage 3 unspecified: Secondary | ICD-10-CM

## 2021-11-06 DIAGNOSIS — Z79899 Other long term (current) drug therapy: Secondary | ICD-10-CM | POA: Diagnosis not present

## 2021-11-06 DIAGNOSIS — I482 Chronic atrial fibrillation, unspecified: Secondary | ICD-10-CM

## 2021-11-06 DIAGNOSIS — Z7901 Long term (current) use of anticoagulants: Secondary | ICD-10-CM | POA: Diagnosis not present

## 2021-11-06 DIAGNOSIS — E785 Hyperlipidemia, unspecified: Secondary | ICD-10-CM | POA: Diagnosis not present

## 2021-11-06 DIAGNOSIS — Z7984 Long term (current) use of oral hypoglycemic drugs: Secondary | ICD-10-CM | POA: Insufficient documentation

## 2021-11-06 DIAGNOSIS — I5032 Chronic diastolic (congestive) heart failure: Secondary | ICD-10-CM | POA: Insufficient documentation

## 2021-11-06 DIAGNOSIS — I4821 Permanent atrial fibrillation: Secondary | ICD-10-CM | POA: Insufficient documentation

## 2021-11-06 DIAGNOSIS — I081 Rheumatic disorders of both mitral and tricuspid valves: Secondary | ICD-10-CM | POA: Diagnosis not present

## 2021-11-06 DIAGNOSIS — G4733 Obstructive sleep apnea (adult) (pediatric): Secondary | ICD-10-CM | POA: Insufficient documentation

## 2021-11-06 DIAGNOSIS — I340 Nonrheumatic mitral (valve) insufficiency: Secondary | ICD-10-CM

## 2021-11-06 LAB — BASIC METABOLIC PANEL
Anion gap: 11 (ref 5–15)
BUN: 25 mg/dL — ABNORMAL HIGH (ref 8–23)
CO2: 26 mmol/L (ref 22–32)
Calcium: 9.8 mg/dL (ref 8.9–10.3)
Chloride: 101 mmol/L (ref 98–111)
Creatinine, Ser: 1.49 mg/dL — ABNORMAL HIGH (ref 0.44–1.00)
GFR, Estimated: 37 mL/min — ABNORMAL LOW (ref >60)
Glucose, Bld: 103 mg/dL — ABNORMAL HIGH (ref 70–99)
Potassium: 3.9 mmol/L (ref 3.5–5.1)
Sodium: 138 mmol/L (ref 135–145)

## 2021-11-06 IMAGING — MG DIGITAL SCREENING UNILAT LEFT W/ TOMO W/ CAD
4 series · 4 of 12 positions shown · non-contrast
Comparison: Previous exam(s).

CLINICAL DATA: Screening.

EXAM:
DIGITAL SCREENING UNILATERAL LEFT MAMMOGRAM WITH CAD AND TOMO

[L CC synth-2D]
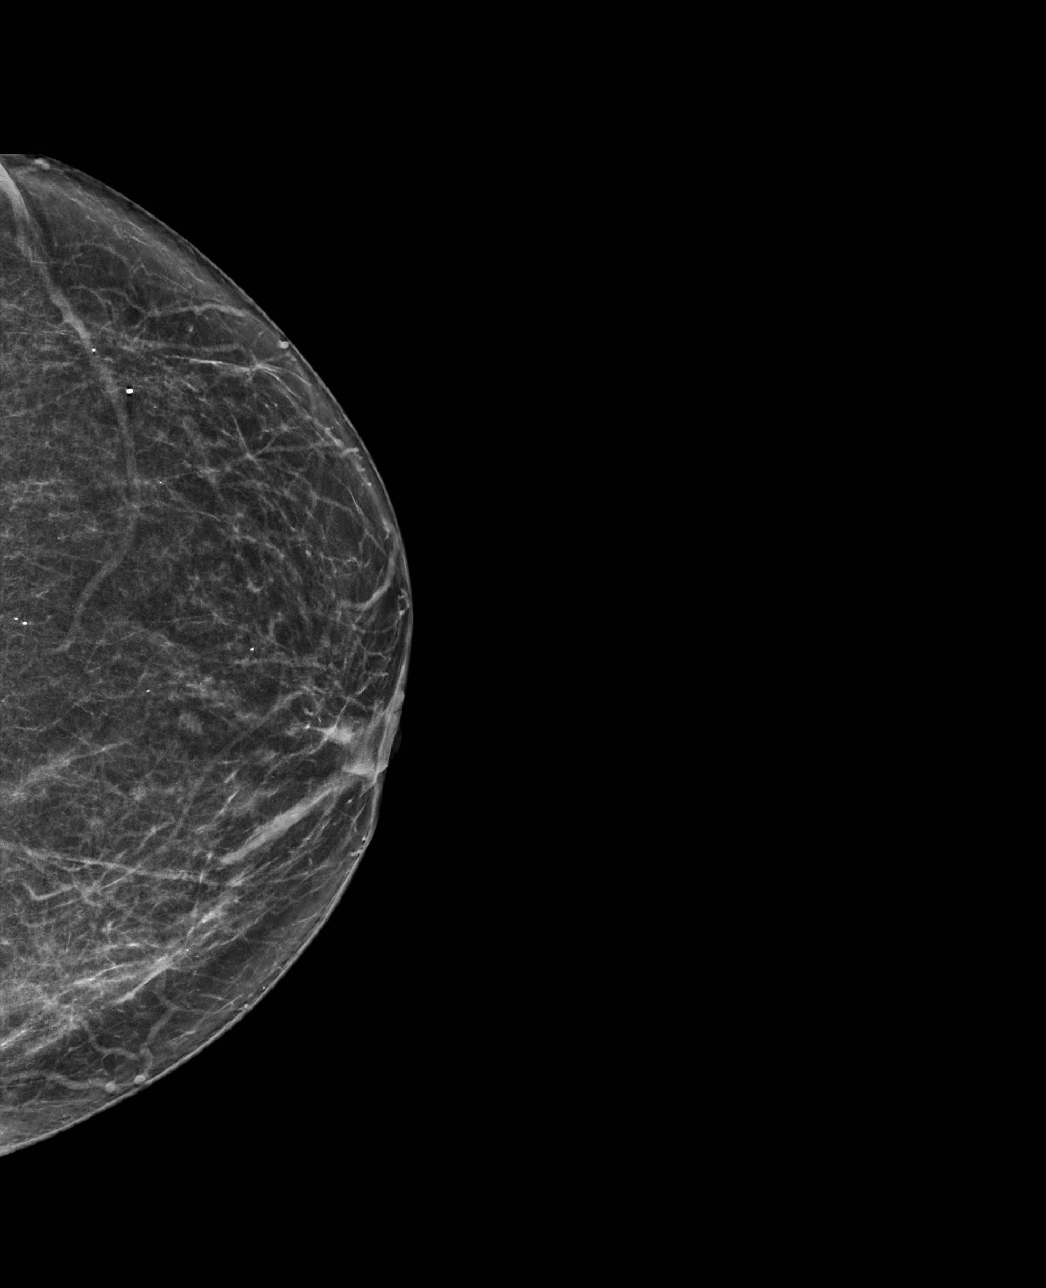

[L MLO synth-2D]
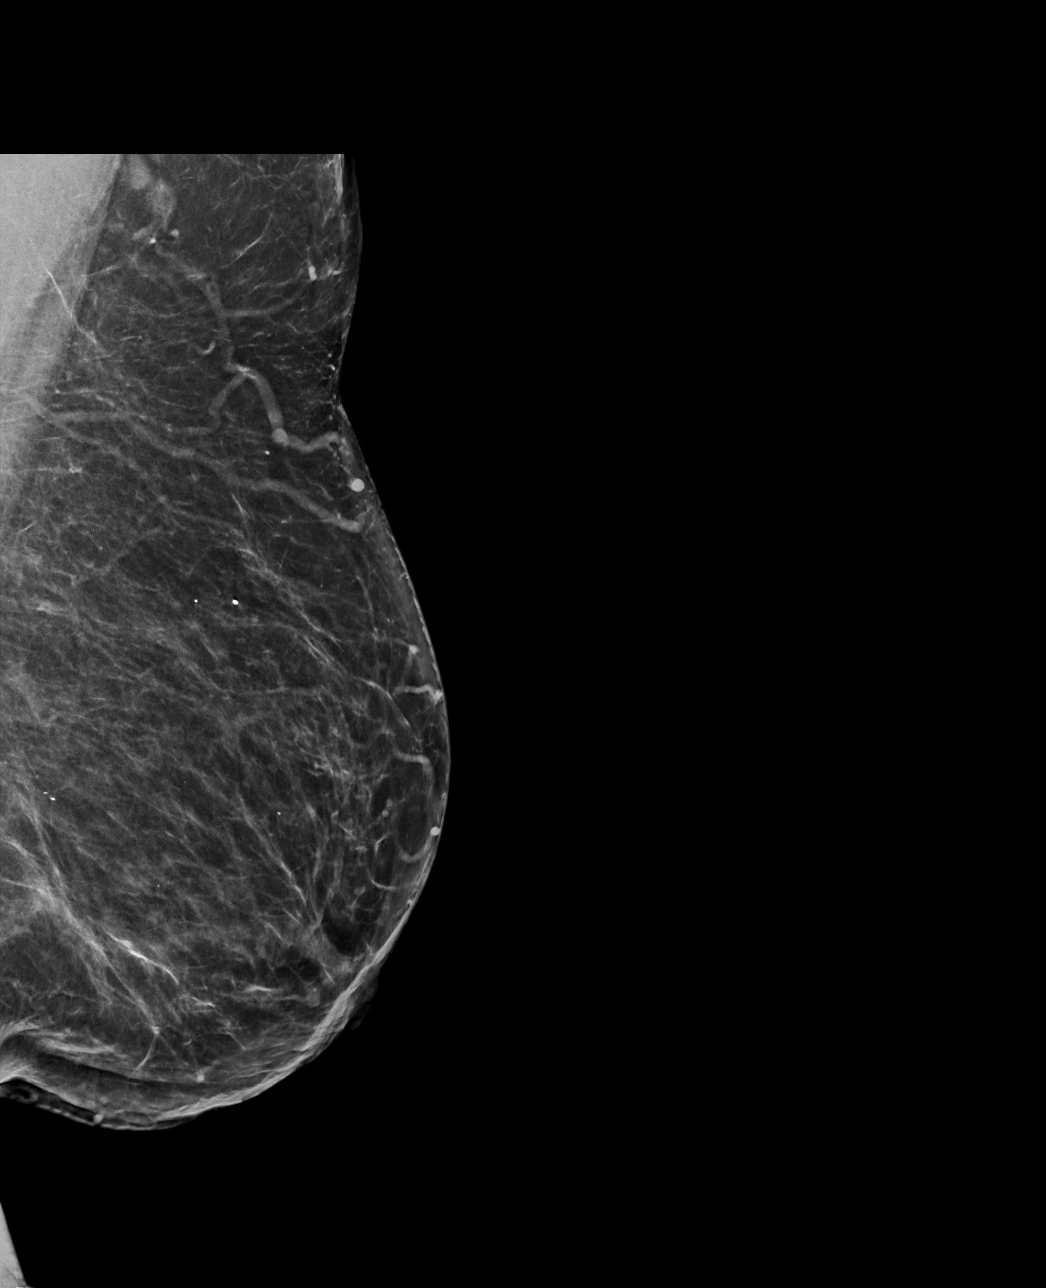

[L MLO tomo · tomo slice 41/80.0]
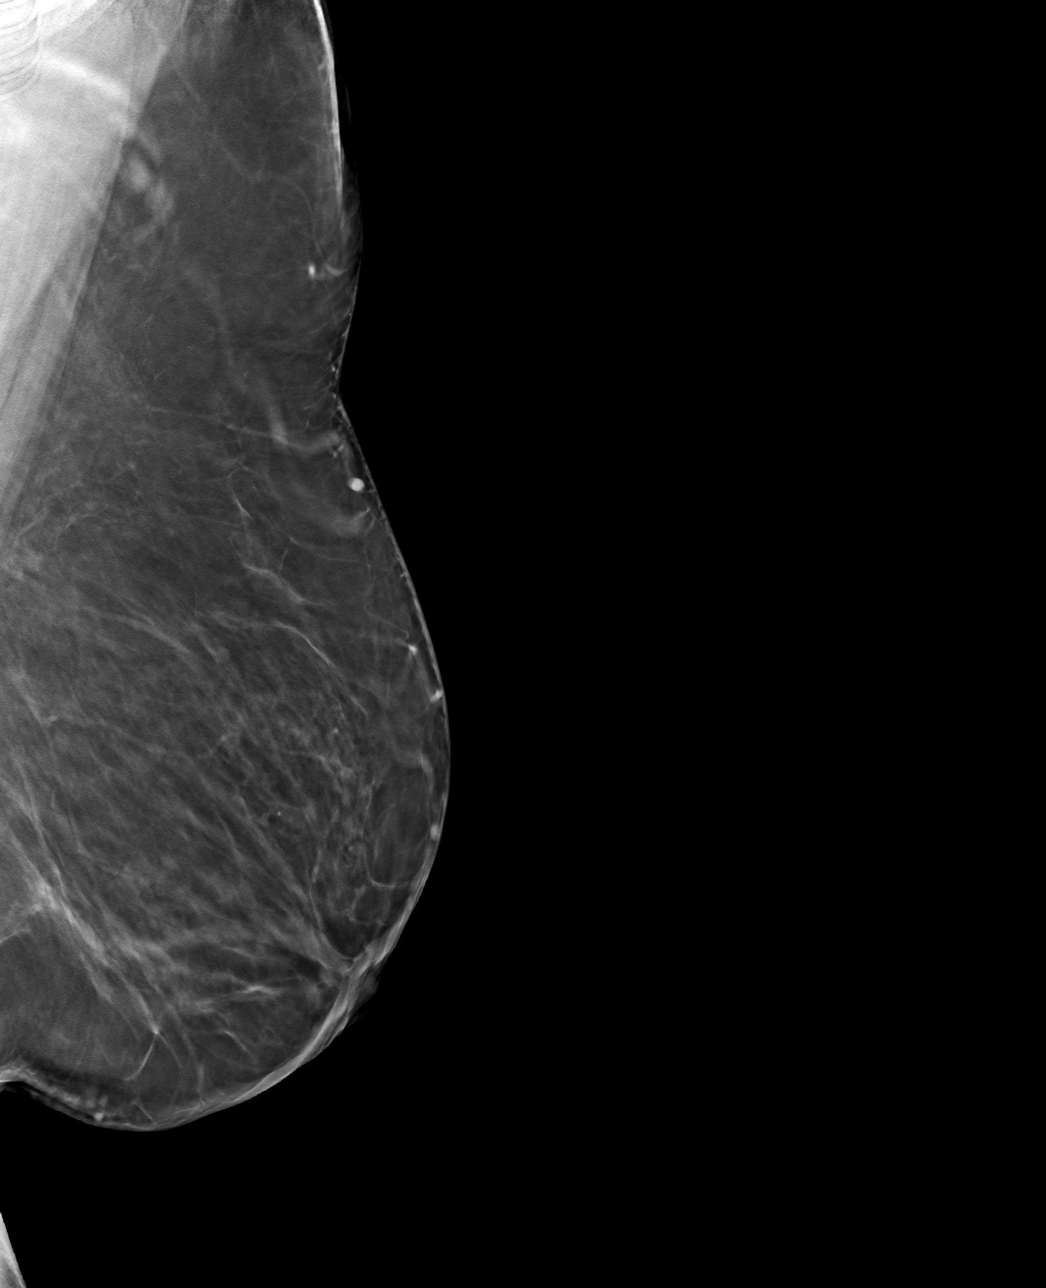

[L CC tomo · tomo slice 31/61.0]
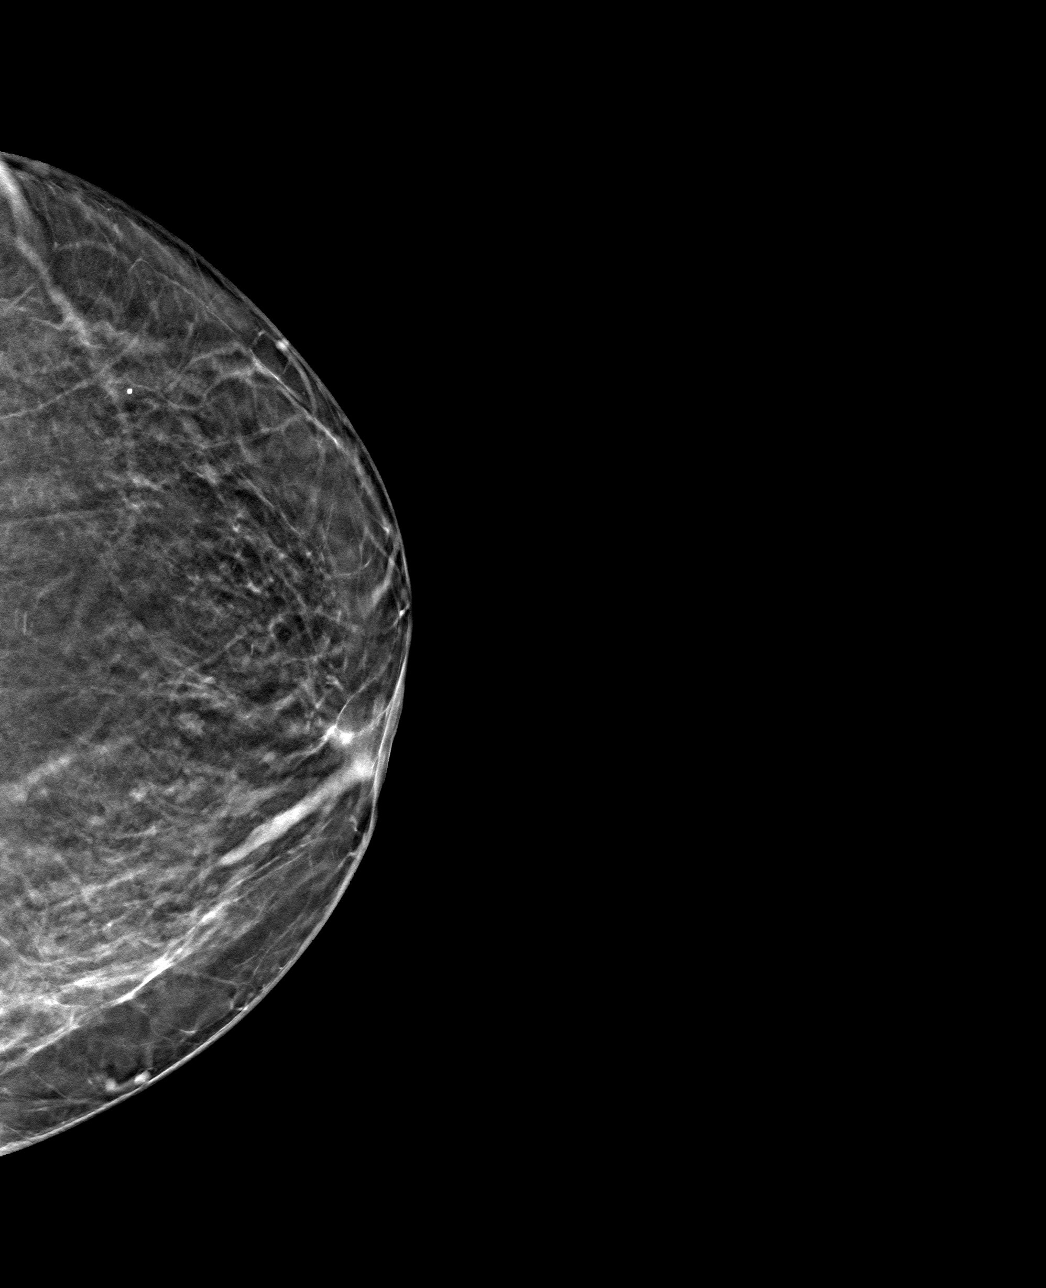

[4 of 12 positions shown; findings below may reference images not displayed]

ACR Breast Density Category b: There are scattered areas of
fibroglandular density.
FINDINGS: The patient has had a right mastectomy. There are no findings
suspicious for malignancy.

Images were processed with CAD.
IMPRESSION: No mammographic evidence of malignancy. A result letter of this
screening mammogram will be mailed directly to the patient.

RECOMMENDATION:
Screening mammogram in one year.  (Code:GY-Q-70Y)

BI-RADS CATEGORY  1: Negative.

## 2021-11-06 MED ORDER — POTASSIUM CHLORIDE CRYS ER 20 MEQ PO TBCR: 40.0000 meq | EXTENDED_RELEASE_TABLET | Freq: Every day | ORAL | 3 refills | Status: DC

## 2021-11-06 MED ORDER — SPIRONOLACTONE 25 MG PO TABS: 25.0000 mg | ORAL_TABLET | Freq: Every day | ORAL | 3 refills | Status: AC

## 2021-11-06 NOTE — Progress Notes (Signed)
Advanced Heart Failure Clinic Note    PCP: Stephens Shire, MD  Primary Cardiologist: Larae Grooms, MD HF Cardiology: Dr Aundra Dubin  HPI: 71 y.o. female w/ chronic diastolic heart failure, RV dysfunction, pulmonary HTN, persistent atrial fibrillation, MR/TR, HTN, HLD, h/o rt sided breast cancer in 2013 tx w/ mastectomy, chemo + radiation, CKD stage IIIb and peripheral neuropathy,  w/ recent admit for a/c diastolic heart failure. Evaluated by cardiology and diuresed w/ IV Lasix. Was noted to be in rate controlled afib, persistent. Echo in 3/23 showed hyperdynamic LVEF, 65-70%, D shaped LV c/w RV volume overload, Grade II DD w/ mod LVH, mildly reduced RV and severely elevated RVSP, 58 mmHg, moderate MR w/ severe BAE and severe TR.   After diuresis, was switched back to oral lasix + Jardiance. Continued on Xarelto and metoprolol for afib. Referred to [facility] clinic. D/c Wt 279 lb. Reports full compliance w/ CPAP. Fully compliant w/ Xarelto. Denies h/o PE/DVT. No family h/o HF. Denies CP. Non smoker. + h/o asthma but no h/o COPD. No known h/o connective tissue disorder.    RHC was arranged from [facility] clinic, done in 4/23.  This showed markedly elevated right and left heart filling pressures with prominent v-waves in the PCWP tracing.  PVR not elevated, pulmonary venous hypertension.  Lasix was increased.   She was seen post cath 4/23 and remained fluid overloaded. Lasix was stopped. Switched to torsemide 60 mg bid. Amyloid work up arranged. Multiple Myeloma negative for M spike protein. Immunofixation unremarkable. She was also set up for TEE to better assess her mitral valve. This showed moderate MR w/ restriction of the posterior leaflet. ERO  0.13 cm^2 by PISA, vena contract area 0.21 cm^2. Also w/ mod-severe TR, normal LVEF 60-65% + mildly D-shaped interventricular septum suggestive of  RV pressure/volume overload.  Follow up 5/23, mild volume overload with NYHA II symptoms. Spiro 12.5 started.    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 Cardiac MRI arranged to help confirm.  Today she returns for HF follow up with her husband. Overall feeling fine. She does not have dyspnea walking on flat ground ground if she takes her time. She had knee OA that limits her physically. Denies palpitations, CP, dizziness, edema, or PND/Orthopnea. Appetite ok. No fever or chills. Weight at home 257 pounds. Taking all medications. Compliant with CPAP.   Labs (4/23): K 3.6, creatinine 1.21, hgb 11.6 Labs (4/23): Multiple Myeloma no M-spike protein, Immunofixation negative  Labs (5/23): K 4.7, creatinine 1.28, hgb 13.2  ECG (personally reviewed): none ordered today.  PMH: 1. CKD stage 3 2. OSA: uses CPAP.  3. Atrial fibrillation: Chronic.  It appears that she has been in AF since at least 2018.  4. Breast cancer on right in 2013: Taxotere/Cytoxan chemotherapy.  5. Peripheral neuropathy.  6. Chronic diastolic CHF with prominent RV failure: Echo in 3/23 with EF 65-70%, moderate LVH, D-shaped septum, mild RVE with mildly decreased systolic function, PASP 58, severe biatrial enlargement, at least moderate MR, severe TR, mild-moderate AI, IVC dilated.  - RHC (4/23): mean RA 22, PA 69/31 mean 46, mean PCWP 30 with v-waves to 44, CI 2.82, PVR 2.6 WU.  - TEE (4/23): moderate MR w/ restriction of the posterior leaflet. ERO  0.13 cm^2 by PISA, vena contract area 0.21 cm^2. Also w/ mod-severe TR, normal LVEF 60-65% + mildly D-shaped interventricular septum suggestive of  RV pressure/volume overload. - PYP (5/23) strongly suggestive of  cardiac amyloidosis, grade 2, H/CL= 1.42 7. CKD stage 3  Review of Systems: All systems reviewed and negative except as per HPI.   Current Outpatient Medications  Medication Sig Dispense Refill   acetaminophen (TYLENOL) 325 MG tablet Take 325-650 mg by mouth every 6 (six) hours as needed (for pain.).      albuterol (PROAIR HFA) 108 (90 Base) MCG/ACT inhaler Inhale 1-2 puffs into the lungs every 6 (six) hours as needed for wheezing or shortness of breath. For shortness of breath 18 g 3   cabergoline (DOSTINEX) 0.5 MG tablet Take 0.5 mg by mouth 2 (two) times a week. Tuesday and Thursday     Cholecalciferol (VITAMIN D3) 1000 UNITS CAPS Take 1,000 Units by mouth every other day.      dorzolamide (TRUSOPT) 2 % ophthalmic solution Place 1 drop into both eyes 2 (two) times daily.     empagliflozin (JARDIANCE) 10 MG TABS tablet Take 1 tablet (10 mg total) by mouth daily. 90 tablet 3   GLUCOSAMINE HCL PO Take 1 tablet by mouth daily.     latanoprost (XALATAN) 0.005 % ophthalmic solution Place 1 drop into both eyes at bedtime.     metoprolol succinate (TOPROL-XL) 50 MG 24 hr tablet TAKE ONE AND ONE-HALF TABLETS TWICE A DAY 270 tablet 3   Multiple Vitamin (MULITIVITAMIN WITH MINERALS) TABS Take 1 tablet by mouth daily with breakfast.      potassium chloride SA (KLOR-CON M) 20 MEQ tablet Take 3 tablets (60 mEq total) by mouth daily. 90 tablet 3   prednisoLONE acetate (PRED FORTE) 1 % ophthalmic suspension Place 1 drop into the right eye daily.     simvastatin (ZOCOR) 20 MG tablet TAKE 1 TABLET AT BEDTIME 90 tablet 3   spironolactone (ALDACTONE) 25 MG tablet Take 0.5 tablets (12.5 mg total) by mouth daily. 45 tablet 3   torsemide (DEMADEX) 20 MG tablet Take 3 tablets (60 mg total) by mouth 2 (two) times daily. 540 tablet 3   vitamin B-12 (CYANOCOBALAMIN) 500 MCG tablet Take 500 mcg by mouth daily.     XARELTO 20 MG TABS tablet TAKE 1 TABLET DAILY WITH SUPPER 90 tablet 3   No current facility-administered medications for this encounter.   Facility-Administered Medications Ordered in Other Encounters  Medication Dose Route Frequency Provider Last Rate Last Admin   lidocaine (cardiac) 100 mg/71m (XYLOCAINE) 20 MG/ML injection 2%    Anesthesia Intra-op JMontel Clock CRNA   60 mg at 06/02/17 1343    Allergies  Allergen Reactions   Trandolapril-Verapamil Hcl Er Swelling    Causes lips to swell ([name] is the name brand) Tolerates amlodipine   Social History   Socioeconomic History   Marital status: Married    Spouse name: Not on file   Number of children: Not on file   Years of education: Not on file   Highest education level: Not on file  Occupational History   Not on file  Tobacco Use   Smoking status: Never   Smokeless tobacco: Never  Vaping Use   Vaping Use: Never used  Substance and Sexual Activity   Alcohol use: No   Drug use: No   Sexual activity: Yes    Birth control/protection: Post-menopausal, Surgical  Other Topics Concern   Not on file  Social History Narrative   Not on file   Social Determinants of Health   Financial Resource Strain: Not on file  Food Insecurity: Not on file  Transportation Needs: Not on file  Physical Activity: Not on file  Stress: Not on file  Social Connections: Not on file  Intimate Partner Violence: Not on file   Family History  Problem Relation Age of Onset   Arthritis Mother    Cancer Father 18       prostate ca   Breast cancer Neg Hx    Wt Readings from Last 3 Encounters:  11/06/21 115.8 kg (255 lb 3.2 oz)  10/03/21 115.1 kg (253 lb 12.8 oz)  09/20/21 117.2 kg (258 lb 6.1 oz)   BP 108/70   Pulse 86   Wt 115.8 kg (255 lb 3.2 oz)   SpO2 97%   BMI 46.68 kg/m   PHYSICAL EXAM: General:  NAD. No resp difficulty, arrived in [facility] HEENT: Normal Neck: Supple. JVP 6-7. Carotids 2+ bilat; no bruits. No lymphadenopathy or thryomegaly appreciated. Cor: PMI nondisplaced. Irregular rate & rhythm. No rubs, gallops, 2/6 HSM apex Lungs: Clear Abdomen: Obese, soft, nontender, nondistended. No hepatosplenomegaly. No bruits or masses. Good bowel sounds. Extremities: No cyanosis, clubbing, rash, 1+ BLE edema Neuro: Alert & oriented x 3, cranial nerves grossly intact. Moves all 4 extremities w/o difficulty. Affect  pleasant.  ASSESSMENT & PLAN: 1. Chronic diastolic CHF with prominent RV dysfunction: Echo in 3/23 with  EF 65-70%, moderate LVH, D-shaped septum, mild RVE with mildly decreased systolic function, PASP 58, severe biatrial enlargement, at least moderate MR, severe TR, mild-moderate AI, IVC dilated. [place] in 4/23 showed markedly elevated right and left heart filling pressures with prominent v-waves in the PCWP tracing.  PVR not elevated, pulmonary venous hypertension.  LV and RV failure components with valvular heart disease.  Chronic atrial fibrillation may have been a major driver for this. Multiple Myeloma study and immunofixation both normal. PYP 5/23 strongly suggestive of cardiac amyloidosis. Cardiac MRI to help confirm cardiac amyloid has been arranged. [place] II, confounded by body habitus and knee OA. She remains mildly fluid overloaded.  - Increase spironolactone to 25 mg daily and decrease KCL to 40 daily. BMET today and repeat in 1 week. - Continue empagliflozin 10 mg daily. No GU symptoms. - Continue torsemide 60 mg bid.  - No Entresto (h/o angioedema w/ ACEi).  - Continue Toprol XL 75 mg bid.  - Will send [facility] gene testing today for TTR amyloidosis. 2. Pulmonary hypertension: This appears to be pulmonary venous hypertension.  Treatment will be diuresis.  3. Permanent atrial fibrillation: Severe biatrial enlargement, AF present for several years. She is well rate controlled. Unlikely to successfully cardiovert even with anti-arrhythmic given severe LAE and chronicity.  - Continue Xarelto.  No  bleeding issues. 4. OSA: Continue CPAP.  5. Mitral Regurgitation: Possible atrial functional MR with severe LAE. TEE 4/23 showed moderate MR w/ restriction of the posterior leaflet. ERO  0.13 cm^2 by PISA, vena contract area 0.21 cm^2.  - HF optimization per above  5. Tricuspid Regurgitation: Mod-severe on TEE 4/23.  Medically manage for now, may be candidate down the road for percutaneous repair.   6. CKD: Stage 3. BMET today. - Continue empagliflozin  Follow up in 6 weeks with Dr. Aundra Dubin. We had a lengthy discussion on cardiac amyloidosis work up and treatment.  [place], FNP-BC  11/06/2021

## 2021-11-06 NOTE — Progress Notes (Signed)
Blood collected for TTR genetic testing per Dr Aundra Dubin.  Order form completed and both shipped by [employer] to [facility].

## 2021-11-06 NOTE — Patient Instructions (Addendum)
INCREASE Spironolactone to 25 mg, one tab daily DECREASE Potassium to 40 meq daily  Labs today We will only contact you if something comes back abnormal or we need to make some changes. Otherwise no news is good news!  Genetic test has been done, this has to be sent to [place] to be processed and can take 1-2 weeks to get results back.  We will let you know the results.  Your physician recommends that you schedule a follow-up appointment in: 12 weeks with Dr Aundra Dubin  Do the following things EVERYDAY: Weigh yourself in the morning before breakfast. Write it down and keep it in a log. Take your medicines as prescribed Eat low salt foods--Limit salt (sodium) to 2000 mg per day.  Stay as active as you can everyday Limit all fluids for the day to less than 2 liters  At the [facility], you and your health needs are our priority. As part of our continuing mission to provide you with exceptional heart care, we have created designated Provider Care Teams. These Care Teams include your primary Cardiologist (physician) and Advanced Practice Providers (APPs- Physician Assistants and Nurse Practitioners) who all work together to provide you with the care you need, when you need it.   You may see any of the following providers on your designated Care Team at your next follow up: Dr Glori Bickers Dr Haynes Kerns, NP Lyda Jester, [place] [facility] [place], [place] Audry Riles, PharmD   Please be sure to bring in all your medications bottles to every appointment.

## 2021-11-14 ENCOUNTER — Ambulatory Visit: Payer: Medicare Other

## 2021-11-15 ENCOUNTER — Encounter (HOSPITAL_COMMUNITY): Payer: Self-pay | Admitting: *Deleted

## 2021-11-21 ENCOUNTER — Ambulatory Visit
Admission: RE | Admit: 2021-11-21 | Discharge: 2021-11-21 | Disposition: A | Discharge status: Home / Self Care | Payer: Medicare Other | Source: Ambulatory Visit | Attending: Hematology | Admitting: Hematology

## 2021-11-21 DIAGNOSIS — Z1231 Encounter for screening mammogram for malignant neoplasm of breast: Secondary | ICD-10-CM

## 2021-11-21 HISTORY — DX: Personal history of irradiation: Z92.3

## 2021-11-21 HISTORY — DX: Personal history of antineoplastic chemotherapy: Z92.21

## 2021-11-22 ENCOUNTER — Telehealth (HOSPITAL_COMMUNITY): Payer: Self-pay | Admitting: *Deleted

## 2021-11-22 MED ORDER — TORSEMIDE 20 MG PO TABS: 40.0000 mg | ORAL_TABLET | Freq: Two times a day (BID) | ORAL | 3 refills | Status: DC

## 2021-11-22 NOTE — Telephone Encounter (Signed)
Pt returned call and is aware of results.   [place], FNP  11/06/2021 12:28 PM EDT     Decrease torsemide to 40 mg bid as we are increasing spiro today.   She has repeat labs arranged in 1 week to follow kidney function

## 2021-12-05 ENCOUNTER — Telehealth (HOSPITAL_COMMUNITY): Payer: Self-pay | Admitting: *Deleted

## 2021-12-05 NOTE — Telephone Encounter (Addendum)
Received [facility] hATTR test results: Negative  Pt is aware

## 2021-12-09 ENCOUNTER — Other Ambulatory Visit (HOSPITAL_COMMUNITY): Payer: Self-pay | Admitting: Cardiology

## 2021-12-09 ENCOUNTER — Telehealth (HOSPITAL_COMMUNITY): Payer: Self-pay | Admitting: Emergency Medicine

## 2021-12-09 NOTE — Telephone Encounter (Signed)
Reaching out to patient to offer assistance regarding upcoming cardiac imaging study; pt verbalizes understanding of appt date/time, parking situation and where to check in, pre-test NPO status and medications ordered, and verified current allergies; name and call back number provided for further questions should they arise Marchia Bond RN Navigator Cardiac Imaging [facility] [phone] office [phone] cell  Holding diuretics  CLAUSTRO - needs meds R arm restriction Arrival [phone]

## 2021-12-10 ENCOUNTER — Other Ambulatory Visit (HOSPITAL_COMMUNITY): Payer: Self-pay | Admitting: Emergency Medicine

## 2021-12-10 DIAGNOSIS — F40240 Claustrophobia: Secondary | ICD-10-CM

## 2021-12-10 DIAGNOSIS — I5032 Chronic diastolic (congestive) heart failure: Secondary | ICD-10-CM

## 2021-12-10 MED ORDER — ALPRAZOLAM 0.5 MG PO TABS: 0.5000 mg | ORAL_TABLET | Freq: Once | ORAL | 0 refills | Status: AC

## 2021-12-11 ENCOUNTER — Ambulatory Visit (HOSPITAL_COMMUNITY)
Admission: RE | Admit: 2021-12-11 | Discharge: 2021-12-11 | Disposition: A | Discharge status: Home / Self Care | Payer: Medicare Other | Source: Ambulatory Visit | Attending: Cardiology | Admitting: Cardiology

## 2021-12-11 DIAGNOSIS — I5032 Chronic diastolic (congestive) heart failure: Secondary | ICD-10-CM | POA: Diagnosis present

## 2021-12-11 MED ORDER — GADOBUTROL 1 MMOL/ML IV SOLN
10.0000 mL | Freq: Once | INTRAVENOUS | Status: AC | PRN
  Administered 2021-12-11: 10 mL via INTRAVENOUS

## 2021-12-12 ENCOUNTER — Telehealth (HOSPITAL_COMMUNITY): Payer: Self-pay

## 2021-12-12 NOTE — Telephone Encounter (Addendum)
Pt aware, agreeable, and verbalized understanding    ----- Message from Larey Dresser, MD sent at 12/11/2021  9:40 PM EDT ----- RV dysfunction but no evidence for cardiac amyloidosis.

## 2022-02-11 ENCOUNTER — Ambulatory Visit (HOSPITAL_COMMUNITY)
Admission: RE | Admit: 2022-02-11 | Discharge: 2022-02-11 | Disposition: A | Discharge status: Home / Self Care | Payer: Medicare Other | Source: Ambulatory Visit | Attending: Cardiology | Admitting: Cardiology

## 2022-02-11 ENCOUNTER — Encounter (HOSPITAL_COMMUNITY): Payer: Self-pay | Admitting: Cardiology

## 2022-02-11 VITALS — BP 110/70 | HR 85 | Wt 253.2 lb

## 2022-02-11 DIAGNOSIS — C9000 Multiple myeloma not having achieved remission: Secondary | ICD-10-CM | POA: Diagnosis not present

## 2022-02-11 DIAGNOSIS — I5032 Chronic diastolic (congestive) heart failure: Secondary | ICD-10-CM | POA: Diagnosis present

## 2022-02-11 DIAGNOSIS — Z7984 Long term (current) use of oral hypoglycemic drugs: Secondary | ICD-10-CM | POA: Insufficient documentation

## 2022-02-11 DIAGNOSIS — I081 Rheumatic disorders of both mitral and tricuspid valves: Secondary | ICD-10-CM | POA: Insufficient documentation

## 2022-02-11 DIAGNOSIS — G4733 Obstructive sleep apnea (adult) (pediatric): Secondary | ICD-10-CM | POA: Diagnosis not present

## 2022-02-11 DIAGNOSIS — Z7901 Long term (current) use of anticoagulants: Secondary | ICD-10-CM | POA: Diagnosis not present

## 2022-02-11 DIAGNOSIS — I130 Hypertensive heart and chronic kidney disease with heart failure and stage 1 through stage 4 chronic kidney disease, or unspecified chronic kidney disease: Secondary | ICD-10-CM | POA: Insufficient documentation

## 2022-02-11 DIAGNOSIS — N1832 Chronic kidney disease, stage 3b: Secondary | ICD-10-CM | POA: Insufficient documentation

## 2022-02-11 DIAGNOSIS — E785 Hyperlipidemia, unspecified: Secondary | ICD-10-CM | POA: Insufficient documentation

## 2022-02-11 DIAGNOSIS — I4821 Permanent atrial fibrillation: Secondary | ICD-10-CM | POA: Diagnosis present

## 2022-02-11 DIAGNOSIS — Z853 Personal history of malignant neoplasm of breast: Secondary | ICD-10-CM | POA: Insufficient documentation

## 2022-02-11 DIAGNOSIS — Z79899 Other long term (current) drug therapy: Secondary | ICD-10-CM | POA: Diagnosis not present

## 2022-02-11 DIAGNOSIS — I272 Pulmonary hypertension, unspecified: Secondary | ICD-10-CM | POA: Diagnosis not present

## 2022-02-11 LAB — BASIC METABOLIC PANEL
Anion gap: 11 (ref 5–15)
BUN: 24 mg/dL — ABNORMAL HIGH (ref 8–23)
CO2: 28 mmol/L (ref 22–32)
Calcium: 9.9 mg/dL (ref 8.9–10.3)
Chloride: 100 mmol/L (ref 98–111)
Creatinine, Ser: 1.59 mg/dL — ABNORMAL HIGH (ref 0.44–1.00)
GFR, Estimated: 34 mL/min — ABNORMAL LOW (ref >60)
Glucose, Bld: 96 mg/dL (ref 70–99)
Potassium: 3.9 mmol/L (ref 3.5–5.1)
Sodium: 139 mmol/L (ref 135–145)

## 2022-02-11 LAB — BRAIN NATRIURETIC PEPTIDE: B Natriuretic Peptide: 148.3 pg/mL — ABNORMAL HIGH (ref 0.0–100.0)

## 2022-02-11 MED ORDER — TORSEMIDE 20 MG PO TABS: ORAL_TABLET | ORAL | 3 refills | Status: DC

## 2022-02-11 NOTE — Patient Instructions (Addendum)
Increase Torsemide to 60 mg Twice daily for 3 days, then go to 60 mg in the morning and 40 mg in the evening.  Labs done today, your results will be available in MyChart, we will contact you for abnormal readings.  Repeat blood work in 10 days   You have been ordered a PYP Scan.  This is done in the Radiology Department of [facility].  When you come for this test please plan to be there 2-3 hours. ONCE APPROVED BY INSURANCE YOU WILL BE CALLED TO SCHEDULE THE TEST   Your physician recommends that you schedule a follow-up appointment in: 6 weeks  If you have any questions or concerns before your next appointment please send [name] a message through [place] or call our office at [phone].    TO LEAVE A MESSAGE FOR THE NURSE SELECT OPTION 2, PLEASE LEAVE A MESSAGE INCLUDING: YOUR NAME DATE OF BIRTH CALL BACK NUMBER REASON FOR CALL**this is important as we prioritize the call backs  YOU WILL RECEIVE A CALL BACK THE SAME DAY AS LONG AS YOU CALL BEFORE 4:00 PM  At the [facility], you and your health needs are our priority. As part of our continuing mission to provide you with exceptional heart care, we have created designated Provider Care Teams. These Care Teams include your primary Cardiologist (physician) and Advanced Practice Providers (APPs- Physician Assistants and Nurse Practitioners) who all work together to provide you with the care you need, when you need it.   You may see any of the following providers on your designated Care Team at your next follow up: Dr Glori Bickers Dr Loralie Champagne Dr. Roxana Hires, NP Lyda Jester, [place] [facility] [place], [place] Forestine Na, NP Audry Riles, PharmD   Please be sure to bring in all your medications bottles to every appointment.

## 2022-02-11 NOTE — Progress Notes (Signed)
Advanced Heart Failure Clinic Note    PCP: Stephens Shire, MD  Primary Cardiologist: Larae Grooms, MD HF Cardiology: Dr Aundra Dubin  HPI: 71 y.o. female w/ chronic diastolic heart failure, RV dysfunction, pulmonary HTN, persistent atrial fibrillation, MR/TR, HTN, HLD, h/o rt sided breast cancer in 2013 tx w/ mastectomy, chemo + radiation, CKD stage IIIb and peripheral neuropathy,  w/ recent admit for a/c diastolic heart failure. Evaluated by cardiology and diuresed w/ IV Lasix. Was noted to be in rate controlled afib, persistent. Echo in 3/23 showed hyperdynamic LVEF, 65-70%, D shaped LV c/w RV volume overload, Grade II DD w/ mod LVH, mildly reduced RV and severely elevated RVSP, 58 mmHg, moderate MR w/ severe BAE and severe TR.   After diuresis, was switched back to oral lasix + Jardiance. Continued on Xarelto and metoprolol for afib. Referred to [facility] clinic. D/c Wt 279 lb. Reports full compliance w/ CPAP. Fully compliant w/ Xarelto. Denies h/o PE/DVT. No family h/o HF. Denies CP. Non smoker. + h/o asthma but no h/o COPD. No known h/o connective tissue disorder.    RHC was arranged from [facility] clinic, done in 4/23.  This showed markedly elevated right and left heart filling pressures with prominent v-waves in the PCWP tracing.  PVR not elevated, pulmonary venous hypertension.  Lasix was increased.   She was seen post cath 4/23 and remained fluid overloaded. Lasix was stopped. Switched to torsemide 60 mg bid. Amyloid work up arranged. Multiple Myeloma negative for M spike protein. Immunofixation unremarkable. She was also set up for TEE to better assess her mitral valve. This showed moderate MR w/ restriction of the posterior leaflet. ERO  0.13 cm^2 by PISA, vena contract area 0.21 cm^2. Also w/ mod-severe TR, normal LVEF 60-65% + mildly D-shaped interventricular septum suggestive of  RV pressure/volume overload.  Follow up 5/23, mild volume overload with NYHA II symptoms. Spiro 12.5 started.    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 Cardiac MRI in 7/23, however, was not suggestive of cardiac amyloidosis: LV EF 59%, no LGE, ECV 30%, moderate RV enlargement with RVEF 29%, biatrial enlargement, mild MR. [employer] gene testing was negative for hATTR.   Today she returns for HF follow up with her husband. She is using CPAP.  She is short of breath when walking about 100 feet.  She uses a cane. No chest pain.  No lightheadedness. She does not feel tingling/numbness in her feet. Weight down 2 lbs.    Labs (4/23): K 3.6, creatinine 1.21, hgb 11.6 Labs (4/23): Multiple Myeloma no M-spike protein, Immunofixation negative  Labs (5/23): K 4.7, creatinine 1.28, hgb 13.2 Labs (6/23): K 4, creatinine 1.35, SPEP negative, urine immunofixation negative.   ECG (personally reviewed): Atrial fibrillation rate 87  PMH: 1. CKD stage 3 2. OSA: uses CPAP.  3. Atrial fibrillation: Chronic.  It appears that she has been in AF since at least 2018.  4. Breast cancer on right in 2013: Taxotere/Cytoxan chemotherapy.  5. Peripheral neuropathy.  6. Chronic diastolic CHF with prominent RV failure: Echo in 3/23 with EF 65-70%, moderate LVH, D-shaped septum, mild RVE with mildly decreased systolic function, PASP 58, severe biatrial enlargement, at least moderate MR, severe TR, mild-moderate AI, IVC dilated.  - RHC (4/23): mean RA 22, PA 69/31 mean 46, mean PCWP 30 with v-waves to 44, CI 2.82, PVR 2.6 WU.  - TEE (4/23): moderate MR w/ restriction of the posterior leaflet. ERO  0.13  cm^2 by PISA, vena contract area 0.21 cm^2. Also w/ mod-severe TR, normal LVEF 60-65% + mildly D-shaped interventricular septum suggestive of  RV pressure/volume overload. - PYP (5/23) strongly suggestive of cardiac amyloidosis, grade 2, H/CL= 1.42 - Cardiac MRI (7/23): LV EF 59%, no LGE, ECV 30%, moderate RV enlargement with RVEF 29%, biatrial enlargement, mild MR. 7.  CKD stage 3  Review of Systems: All systems reviewed and negative except as per HPI.   Current Outpatient Medications  Medication Sig Dispense Refill   acetaminophen (TYLENOL) 325 MG tablet Take 325-650 mg by mouth every 6 (six) hours as needed (for pain.).     albuterol (PROAIR HFA) 108 (90 Base) MCG/ACT inhaler Inhale 1-2 puffs into the lungs every 6 (six) hours as needed for wheezing or shortness of breath. For shortness of breath 18 g 3   azaTHIOprine (IMURAN) 50 MG tablet Take 100 mg by mouth daily.     cabergoline (DOSTINEX) 0.5 MG tablet Take 0.5 mg by mouth 2 (two) times a week. Tuesday and Thursday     Cholecalciferol (VITAMIN D3) 1000 UNITS CAPS Take 1,000 Units by mouth every other day.      dorzolamide (TRUSOPT) 2 % ophthalmic solution Place 1 drop into both eyes 2 (two) times daily.     empagliflozin (JARDIANCE) 10 MG TABS tablet Take 1 tablet (10 mg total) by mouth daily. 90 tablet 3   GLUCOSAMINE HCL PO Take 1 tablet by mouth daily.     latanoprost (XALATAN) 0.005 % ophthalmic solution Place 1 drop into both eyes at bedtime.     metoprolol succinate (TOPROL-XL) 50 MG 24 hr tablet TAKE ONE AND ONE-HALF TABLETS TWICE A DAY 270 tablet 3   Multiple Vitamin (MULITIVITAMIN WITH MINERALS) TABS Take 1 tablet by mouth daily with breakfast.      potassium chloride SA (KLOR-CON M) 20 MEQ tablet Take 2 tablets (40 mEq total) by mouth daily. 60 tablet 3   prednisoLONE acetate (PRED FORTE) 1 % ophthalmic suspension Place 1 drop into the right eye daily.     simvastatin (ZOCOR) 20 MG tablet TAKE 1 TABLET AT BEDTIME 90 tablet 3   spironolactone (ALDACTONE) 25 MG tablet Take 1 tablet (25 mg total) by mouth daily. 90 tablet 3   vitamin B-12 (CYANOCOBALAMIN) 500 MCG tablet Take 500 mcg by mouth daily.     XARELTO 20 MG TABS tablet TAKE 1 TABLET DAILY WITH SUPPER 90 tablet 3   torsemide (DEMADEX) 20 MG tablet Take 3 tablets (60 mg total) by mouth in the morning AND 2 tablets (40 mg total) every  evening. 540 tablet 3   No current facility-administered medications for this encounter.   Facility-Administered Medications Ordered in Other Encounters  Medication Dose Route Frequency Provider Last Rate Last Admin   lidocaine (cardiac) 100 mg/24ml (XYLOCAINE) 20 MG/ML injection 2%    Anesthesia Intra-op Epimenio Sarin, CRNA   60 mg at 06/02/17 1343   Allergies  Allergen Reactions   Trandolapril-Verapamil Hcl Er Swelling    Causes lips to swell Jolene Provost" is the name brand) Tolerates amlodipine   Social History   Socioeconomic History   Marital status: Married    Spouse name: Not on file   Number of children: Not on file   Years of education: Not on file   Highest education level: Not on file  Occupational History   Not on file  Tobacco Use   Smoking status: Never   Smokeless tobacco: Never  Vaping Use  Vaping Use: Never used  Substance and Sexual Activity   Alcohol use: No   Drug use: No   Sexual activity: Yes    Birth control/protection: Post-menopausal, Surgical  Other Topics Concern   Not on file  Social History Narrative   Not on file   Social Determinants of Health   Financial Resource Strain: Not on file  Food Insecurity: Not on file  Transportation Needs: Not on file  Physical Activity: Not on file  Stress: Not on file  Social Connections: Not on file  Intimate Partner Violence: Not on file   Family History  Problem Relation Age of Onset   Arthritis Mother    Cancer Father 14       prostate ca   Breast cancer Neg Hx    Wt Readings from Last 3 Encounters:  02/11/22 114.9 kg (253 lb 3.2 oz)  11/06/21 115.8 kg (255 lb 3.2 oz)  10/03/21 115.1 kg (253 lb 12.8 oz)   BP 110/70   Pulse 85   Wt 114.9 kg (253 lb 3.2 oz)   SpO2 98%   BMI 46.31 kg/m   PHYSICAL EXAM: General: NAD Neck: JVP 8-9 cm, no thyromegaly or thyroid nodule.  Lungs: Clear to auscultation bilaterally with normal respiratory effort. CV: Nondisplaced PMI.  Heart irregular S1/S2,  no S3/S4, no murmur.  1+ ankle edema.  No carotid bruit.  Normal pedal pulses.  Abdomen: Soft, nontender, no hepatosplenomegaly, no distention.  Skin: Intact without lesions or rashes.  Neurologic: Alert and oriented x 3.  Psych: Normal affect. Extremities: No clubbing or cyanosis.  HEENT: Normal.   ASSESSMENT & PLAN: 1. Chronic diastolic CHF with prominent RV dysfunction: Echo in 3/23 with  EF 65-70%, moderate LVH, D-shaped septum, mild RVE with mildly decreased systolic function, PASP 58, severe biatrial enlargement, at least moderate MR, severe TR, mild-moderate AI, IVC dilated. [place] in 4/23 showed markedly elevated right and left heart filling pressures with prominent v-waves in the PCWP tracing.  PVR not elevated, pulmonary venous hypertension.  LV and RV failure components with valvular heart disease.  Chronic atrial fibrillation may have been a major driver for this. Multiple Myeloma study and immunofixation both normal. PYP 5/23 strongly suggestive of cardiac amyloidosis. However, cardiac MRI was NOT suggestive of cardiac amyloidosis with no LGE and no significant ECV percentage elevation.  [employer] gene testing was negative for hATTR.  Cardiac amyloidosis diagnosis still remains uncertain.  NYHA class III symptoms, she is mildly volume overloaded on exam.  - Increase torsemide to 60 mg bid x 3 days, then 60 qam/40 qpm after this.  BMET/BNP today and BMET in 10 days.  - Continue spironolactone 25 mg daily.  - Continue empagliflozin 10 mg daily. No GU symptoms. - No Entresto (h/o angioedema w/ ACEi).  - Continue Toprol XL 75 mg bid.  - I am going to repeat PYP scan.  If this is suggestive of ATTR cardiac amyloidosis, I will arrange for fat pad biopsy.  If this is negative for amyloid, consider endomyocardial biopsy at [facility] to confirm. If repeat PYP scan is not suggestive of ATTR cardiac amyloidosis, no further workup.  2. Pulmonary hypertension: This appears to be pulmonary venous hypertension.   Treatment will be diuresis.  3. Permanent atrial fibrillation: Severe biatrial enlargement, AF present for several years. She is well rate controlled. Unlikely to successfully cardiovert even with anti-arrhythmic given severe LAE and chronicity.  - Continue Xarelto.  4. OSA: Continue CPAP.  5. Mitral Regurgitation: Possible atrial  functional MR with severe LAE. TEE 4/23 showed moderate MR w/ restriction of the posterior leaflet. ERO  0.13 cm^2 by PISA, vena contract area 0.21 cm^2.  - HF optimization per above  5. Tricuspid Regurgitation: Mod-severe on TEE 4/23.  Medically manage for now, may be candidate down the road for percutaneous repair.  6. CKD: Stage 3. BMET today. - Continue empagliflozin  Follow up in 6 weeks with APP  Loralie Champagne,  02/11/2022

## 2022-02-18 ENCOUNTER — Ambulatory Visit (HOSPITAL_COMMUNITY)
Admission: RE | Admit: 2022-02-18 | Discharge: 2022-02-18 | Disposition: A | Discharge status: Home / Self Care | Payer: Medicare Other | Source: Ambulatory Visit | Attending: Cardiology | Admitting: Cardiology

## 2022-02-18 DIAGNOSIS — I5032 Chronic diastolic (congestive) heart failure: Secondary | ICD-10-CM | POA: Diagnosis not present

## 2022-02-18 LAB — BASIC METABOLIC PANEL
Anion gap: 8 (ref 5–15)
BUN: 32 mg/dL — ABNORMAL HIGH (ref 8–23)
CO2: 26 mmol/L (ref 22–32)
Calcium: 9.7 mg/dL (ref 8.9–10.3)
Chloride: 104 mmol/L (ref 98–111)
Creatinine, Ser: 1.55 mg/dL — ABNORMAL HIGH (ref 0.44–1.00)
GFR, Estimated: 35 mL/min — ABNORMAL LOW (ref >60)
Glucose, Bld: 101 mg/dL — ABNORMAL HIGH (ref 70–99)
Potassium: 4.4 mmol/L (ref 3.5–5.1)
Sodium: 138 mmol/L (ref 135–145)

## 2022-02-26 ENCOUNTER — Ambulatory Visit: Payer: Medicare Other | Admitting: Physical Therapy

## 2022-03-06 ENCOUNTER — Telehealth (HOSPITAL_COMMUNITY): Payer: Self-pay | Admitting: *Deleted

## 2022-03-06 NOTE — Telephone Encounter (Signed)
Close encounter 

## 2022-03-07 ENCOUNTER — Ambulatory Visit (HOSPITAL_COMMUNITY)
Admission: RE | Admit: 2022-03-07 | Discharge: 2022-03-07 | Disposition: A | Discharge status: Home / Self Care | Payer: Medicare Other | Source: Ambulatory Visit | Attending: Cardiology | Admitting: Cardiology

## 2022-03-07 DIAGNOSIS — I5032 Chronic diastolic (congestive) heart failure: Secondary | ICD-10-CM | POA: Insufficient documentation

## 2022-03-07 MED ORDER — TECHNETIUM TC 99M PYROPHOSPHATE
21.8000 | Freq: Once | INTRAVENOUS | Status: AC
  Administered 2022-03-07: 21.8 via INTRAVENOUS

## 2022-03-11 NOTE — Therapy (Signed)
OUTPATIENT PHYSICAL THERAPY LOWER EXTREMITY EVALUATION   Patient Name: [name] MRN: [id number] DOB:1951/01/26, 71 y.o., female Today's Date: 03/12/2022   PT End of Session - 03/12/22 0846     Visit Number 1    Date for PT Re-Evaluation 05/28/22    Progress Note Due on Visit 10    PT Start Time 0845    PT Stop Time 0930    PT Time Calculation (min) 45 min    Activity Tolerance Patient tolerated treatment well    Behavior During Therapy [facility] for tasks assessed/performed             Past Medical History:  Diagnosis Date   Arthritis    Asthma    Blood transfusion    hx of last one in 1987   Breast cancer ([place]) 2013   Right Breast Cancer   Cancer of lower-inner quadrant of female breast ([place]) 07/24/2011   right breast cancer /IDC,stage IIB,er/pr=+,her2=Neg   Glaucoma    Heart murmur    History of chemotherapy    taxotere/cytoxan 6 cycles 09/22/11-01/05/12 day 2 neulasta   Hyperlipemia    Hypertension    Lymphedema    right arm wears a sleeve   MRSA (methicillin resistant staph aureus) culture positive    08/2011   Neuromuscular disorder ([place])    peripheral neuropathy hands feet   OSA (obstructive sleep apnea)    CPAP settings- ?    Persistent atrial fibrillation (HCC)    Personal history of chemotherapy    Personal history of radiation therapy    Pituitary abnormality ([place]) 05/25/2013   small pituitary growth-appears stable- Dr. Christella Noa follows   Past Surgical History:  Procedure Laterality Date   ABDOMINAL HYSTERECTOMY  2007   TAH/BSO   BREAST SURGERY     mastectomy RIGHT   BUBBLE STUDY  09/20/2021   Procedure: BUBBLE STUDY;  Surgeon: Larey Dresser, MD;  Location: [place];  Service: Cardiovascular;;   CARDIOVERSION N/A 06/02/2017   Procedure: CARDIOVERSION;  Surgeon: Dorothy Spark, MD;  Location: [facility];  Service: Cardiovascular;  Laterality: N/A;   CARPAL TUNNEL RELEASE Right    2012   CHOLECYSTECTOMY  1987   open - Dr Lindon Romp    COLONOSCOPY N/A 06/13/2013   Procedure: COLONOSCOPY;  Surgeon: Juanita Craver, MD;  Location: [facility];  Service: Endoscopy;  Laterality: N/A;   DILATION AND CURETTAGE OF UTERUS     x3   EYE SURGERY     laser eye surgery for glaucoma   MASTECTOMY MODIFIED RADICAL Right 08/14/11   right , ER/PR +, HER2 -   port-a-cath insertion     port-a-cath removal 1'14   PORT-A-CATH REMOVAL  06/22/2012   Procedure: MINOR REMOVAL PORT-A-CATH;  Surgeon: Haywood Lasso, MD;  Location: [place];  Service: General;  Laterality: Left;   PORTACATH PLACEMENT  09/11/2011   Procedure: INSERTION PORT-A-CATH;  Surgeon: Joyice Faster. Cornett, MD;  Location: [facility];  Service: General;  Laterality: N/A;  Insert of Port   RIGHT HEART CATH N/A 08/28/2021   Procedure: RIGHT HEART CATH;  Surgeon: Larey Dresser, MD;  Location: [place] [facility];  Service: Cardiovascular;  Laterality: N/A;   TEE WITHOUT CARDIOVERSION N/A 09/20/2021   Procedure: TRANSESOPHAGEAL ECHOCARDIOGRAM (TEE);  Surgeon: Larey Dresser, MD;  Location: [facility];  Service: Cardiovascular;  Laterality: N/A;   TUBAL LIGATION     Patient Active Problem List   Diagnosis Date Noted   Class 3 obesity (  [place]) 08/06/2021   Dyslipidemia 08/06/2021   Hypokalemia 08/06/2021   CHF (congestive heart failure) ([place]) 08/05/2021   Hyperbilirubinemia 08/05/2021   Diarrhea 08/05/2021   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   Acute respiratory failure with hypoxia ([place]) 08/05/2021   Dyspnea 03/28/2020   Chronic atrial fibrillation ([place])    Osteopenia 02/15/2016   Obesity 01/19/2014   Neuromuscular disorder ([place])    Breast cancer of lower-inner quadrant of right female breast ([place]) 07/24/2011   Murmur 06/17/2011   Mixed hyperlipidemia 06/17/2011   Obstructive sleep apnea 11/16/2008   Essential hypertension 11/16/2008    PCP: Juanita Craver  REFERRING PROVIDER: Elizabeth Sauer  REFERRING DIAG: M17.0  THERAPY DIAG:  Chronic pain of left  knee  Chronic pain of right knee  Muscle weakness (generalized)  Difficulty in walking, not elsewhere classified  Other abnormalities of gait and mobility  Unsteadiness on feet  Rationale for Evaluation and Treatment Rehabilitation  ONSET DATE: 01/08/22  SUBJECTIVE:   SUBJECTIVE STATEMENT: I am alright my knees are bothering me, today its more my left knee.   PERTINENT HISTORY: Hx of breast cancer, cardioversion, CHF   PAIN:  Are you having pain? Yes: NPRS scale: 6/10 Pain location: knees Pain description: ache Aggravating factors: stairs, getting up and down Relieving factors: moving around, some exercises  PRECAUTIONS: None  WEIGHT BEARING RESTRICTIONS No  FALLS:  Has patient fallen in last 6 months? No  LIVING ENVIRONMENT: Lives with: lives with their spouse Lives in: House/apartment Stairs: Yes: External: 2 steps; on right going up Has following equipment at home: Single point cane  OCCUPATION: retired  PLOF: Independent  PATIENT GOALS to be able to take longer walks and be pain free   OBJECTIVE:   PATIENT SURVEYS:  FOTO 35  COGNITION:  Overall cognitive status: Within functional limits for tasks assessed     SENSATION: WFL   MUSCLE LENGTH: Hamstrings: moderate tightness bilaterally   POSTURE: rounded shoulders, forward head, and flexed trunk    LOWER EXTREMITY ROM: grossly WFL, soft tissue limitations for hip flexion    LOWER EXTREMITY MMT:  MMT Right eval Left eval  Hip flexion 4+ 4+  Hip extension    Hip abduction 4+ 4+  Hip adduction 4+ 4+  Hip internal rotation    Hip external rotation    Knee flexion 4+ 4+ w/pain  Knee extension 4+ 4+  Ankle dorsiflexion    Ankle plantarflexion    Ankle inversion    Ankle eversion     (Blank rows = not tested)   FUNCTIONAL TESTS:  5 times sit to stand: 21.40s, uses 1 hand to help Timed up and go (TUG): 29.68s w/o cane  GAIT: Distance walked: in clinic distances Assistive  device utilized: Single point cane Level of assistance: Modified independence Comments: antalgic gait, forward trunk lean, slowed speed, decreased step and stride length, trendelenberg     TODAY'S TREATMENT: 03/12/22- Eval, reviewed HEP   PATIENT EDUCATION:  Education details: POC and HEP Person educated: Patient Education method: Explanation Education comprehension: verbalized understanding   HOME EXERCISE PROGRAM: Access Code: [email or web address]  Exercises - Seated Long Arc Quad  - 1 x daily - 7 x weekly - 2 sets - 10 reps - Mini Squat with Counter Support  - 1 x daily - 7 x weekly - 2 sets - 10 reps - Standing March with Counter Support  - 1 x daily - 7 x weekly - 2 sets - 10 reps  ASSESSMENT:  CLINICAL IMPRESSION: Patient is  a 71 y.o. female who was seen today for physical therapy evaluation and treatment for bilateral knee pain. She has been using a cane for about 4 months. She was coming to therapy 2 years ago for the same problem and states it was helping. She was also doing aquatic therapy but stopped during covid, was educated about getting trying it again once a week and once a week on land. Patient said she will think about it. Patient will benefit from skilled PT intervention to address her bilateral knee pain to be able to walk, navigate stairs, and complete household chores with more ease.    REHAB POTENTIAL: Good  CLINICAL DECISION MAKING: Stable/uncomplicated  EVALUATION COMPLEXITY: Low   GOALS: Goals reviewed with patient? Yes  SHORT TERM GOALS: Target date: 04/16/22  Patient will be independent with initial HEP. Goal status: INITIAL   LONG TERM GOALS: Target date: 05/28/22  Patient will be independent with advanced/ongoing HEP to improve outcomes and carryover.  Goal status: INITIAL  2.  Patient will report at least 75% improvement in bilateral knee pain to improve QOL. Baseline: 6/10 Goal status: INITIAL  3.  Patient will demonstrate improved functional  LE strength as demonstrated by 5xSTS <15s. Baseline: 21.40 Goal status: INITIAL  4.  Patient will score <20s on TUG without assistive device to decrease fall risk.  Baseline: 29.68s Goal status: INITIAL  5.  Patient will report 35 on FOTO to demonstrate improved functional ability. Baseline: 35 Goal status: INITIAL    PLAN: PT FREQUENCY: 2x/week  PT DURATION: 10 weeks  PLANNED INTERVENTIONS: Therapeutic exercises, Therapeutic activity, Neuromuscular re-education, Balance training, Gait training, Patient/Family education, Self Care, Joint mobilization, Stair training, Cryotherapy, Moist heat, Ionotophoresis 2m/ml Dexamethasone, and Manual therapy  PLAN FOR NEXT SESSION: light gym activities for LE strengthening   MAndris Baumann PT 03/12/2022, 9:28 AM

## 2022-03-12 ENCOUNTER — Ambulatory Visit: Payer: Medicare Other | Attending: Physician Assistant

## 2022-03-12 DIAGNOSIS — G8929 Other chronic pain: Secondary | ICD-10-CM | POA: Insufficient documentation

## 2022-03-12 DIAGNOSIS — R262 Difficulty in walking, not elsewhere classified: Secondary | ICD-10-CM | POA: Diagnosis present

## 2022-03-12 DIAGNOSIS — M6281 Muscle weakness (generalized): Secondary | ICD-10-CM | POA: Diagnosis present

## 2022-03-12 DIAGNOSIS — M25561 Pain in right knee: Secondary | ICD-10-CM | POA: Insufficient documentation

## 2022-03-12 DIAGNOSIS — R2689 Other abnormalities of gait and mobility: Secondary | ICD-10-CM | POA: Insufficient documentation

## 2022-03-12 DIAGNOSIS — R2681 Unsteadiness on feet: Secondary | ICD-10-CM | POA: Insufficient documentation

## 2022-03-12 DIAGNOSIS — M25562 Pain in left knee: Secondary | ICD-10-CM | POA: Insufficient documentation

## 2022-03-17 ENCOUNTER — Ambulatory Visit: Payer: Medicare Other | Admitting: Physical Therapy

## 2022-03-17 ENCOUNTER — Telehealth (HOSPITAL_COMMUNITY): Payer: Self-pay

## 2022-03-17 NOTE — Telephone Encounter (Addendum)
Pt aware, agreeable, and verbalized understanding, wishes to discuss it with her family first. Will let us know of her choice at her follow up appointment on 11/01   ----- Message from Larey Dresser, MD sent at 03/12/2022  6:48 PM EDT ----- Picture is still equivocal for cardiac amyloidosis.  Would like her to get an abdominal fat pad biopsy with general surgery to see if we can find evidence that way.  If that is negative, will likely need endomyocardial biopsy at [facility].

## 2022-03-18 ENCOUNTER — Encounter: Payer: Self-pay | Admitting: Pulmonary Disease

## 2022-03-18 ENCOUNTER — Ambulatory Visit (INDEPENDENT_AMBULATORY_CARE_PROVIDER_SITE_OTHER): Payer: Medicare Other | Admitting: Pulmonary Disease

## 2022-03-18 VITALS — BP 108/70 | HR 78 | Temp 98.4°F | Ht 62.0 in | Wt 249.4 lb

## 2022-03-18 DIAGNOSIS — G4733 Obstructive sleep apnea (adult) (pediatric): Secondary | ICD-10-CM

## 2022-03-18 DIAGNOSIS — Z23 Encounter for immunization: Secondary | ICD-10-CM

## 2022-03-18 NOTE — Patient Instructions (Signed)
High dose flu shot today  Will arrange for a new auto CPAP machine  Follow up in 4 months

## 2022-03-18 NOTE — Progress Notes (Signed)
[facility]  Chief Complaint  Patient presents with   Follow-up    Discuss OSA.  CPAP not working properly x 2 weeks.  Pt states doing well.  Would like flu vaccine.    Past Surgical History:  She  has a past surgical history that includes Tubal ligation; Dilation and curettage of uterus; Carpal tunnel release (Right); Cholecystectomy (1987); Abdominal hysterectomy (2007); Breast surgery; Portacath placement (09/11/2011); Port-a-cath removal (06/22/2012); Eye surgery; port-a-cath insertion; Colonoscopy (N/A, 06/13/2013); Cardioversion (N/A, 06/02/2017); Mastectomy modified radical (Right, 08/14/11); RIGHT HEART CATH (N/A, 08/28/2021); TEE without cardioversion (N/A, 09/20/2021); and Bubble study (09/20/2021).  Past Medical History:  Pituitary adenoma, Peripheral neuropathy, Lymphedema Rt arm, HTN, HLD, Glaucoma, Rt breast cancer 2013, Asthma, OA, Persistent A fib, CKD 3, Pulmonary venous hypertension  Constitutional:  BP 108/70 (BP Location: Right Arm, Patient Position: Sitting, Cuff Size: Large)   Pulse 78   Temp 98.4 F (36.9 C) (Oral)   Ht 5\' 2"  (1.575 m)   Wt 249 lb 6.4 oz (113.1 kg)   SpO2 100%   BMI 45.62 kg/m   Brief Summary:  [name] is a 71 y.o. female with obstructive sleep apnea.      Subjective:   She had Echo and cardiac cath done in March and April.  Found to have pulmonary venous hypertension.  Breathing improved with increased diuretic regimen.  She is sleeping well.  Goes to bed at 9 pm and wakes up at 6 am.  Feels rested during the day.  Has full face mask.  No issues with mask fit.  Her CPAP has error message about motor life.  Physical Exam:   Appearance - well kempt, uses a cane  ENMT - no sinus tenderness, no oral exudate, no LAN, Mallampati 3 airway, no stridor  Respiratory - equal breath sounds bilaterally, no wheezing or rales  CV - s1s2 regular rate and rhythm, 2/6 SM  Ext - no clubbing, no  edema  Skin - no rashes  Psych - normal mood and affect    Sleep Tests:  PSG 06/09/97 >> AHI 37 ONO with CPAP 11/30/17 >> test time 6 hrs.  Baseline SpO2 95%, low SpO2 83%.  Spent 7 min with SpO2 < 88%. Auto CPAP 01/22/20 to 02/20/20 >> used on 29 of 30 nights with average 5 hrs 55 min.  Average AHI 1.8 with median CPAP 8 and 95 th percentile CPAP 11 cm H2O.  Cardiac Tests:  Echo 08/06/21 >> EF 65 to 70%, mod LVH, IV septum flattened, RVSP 57.8 mmHg, severe LA and RA dilation, mod MR, severe TR, mild/mod AR  Social History:  She  reports that she has never smoked. She has never used smokeless tobacco. She reports that she does not drink alcohol and does not use drugs.  Family History:  Her family history includes Arthritis in her mother; Cancer (age of onset: 66) in her father.     Assessment/Plan:   Obstructive sleep apnea. - she is compliant with CPAP and reports benefit from therapy - she uses [employer] for her DME - her current CPAP is more than 73 yrs old - will arrange for a new [employer] auto CPAP 5 to 15 cm H2O - high dose flu shot today   Persistent atrial fibrillation, Chronic diastolic CHF, Valvular heart disease, Pulmonary venous hypertension. - followed by Dr. Casandra Doffing and Dr. Loralie Champagne with cardiology    Time Spent Involved in Patient Care on Day of  Examination:  16 minutes  Follow up:   Patient Instructions  High dose flu shot today  Will arrange for a new auto CPAP machine  Follow up in 4 months  Medication List:   Allergies as of 03/18/2022       Reactions   Trandolapril-verapamil Hcl Er Swelling   Causes lips to swell [name]" is the name brand) Tolerates amlodipine        Medication List        Accurate as of March 18, 2022  9:59 AM. If you have any questions, ask your nurse or doctor.          acetaminophen 325 MG tablet Commonly known as: TYLENOL Take 325-650 mg by mouth every 6 (six) hours as needed (for pain.).   albuterol  108 (90 Base) MCG/ACT inhaler Commonly known as: ProAir HFA Inhale 1-2 puffs into the lungs every 6 (six) hours as needed for wheezing or shortness of breath. For shortness of breath   azaTHIOprine 50 MG tablet Commonly known as: IMURAN Take 100 mg by mouth daily.   cabergoline 0.5 MG tablet Commonly known as: DOSTINEX Take 0.5 mg by mouth 2 (two) times a week. Tuesday and Thursday   dorzolamide 2 % ophthalmic solution Commonly known as: TRUSOPT Place 1 drop into both eyes 2 (two) times daily.   empagliflozin 10 MG Tabs tablet Commonly known as: JARDIANCE Take 1 tablet (10 mg total) by mouth daily.   GLUCOSAMINE HCL PO Take 1 tablet by mouth daily.   latanoprost 0.005 % ophthalmic solution Commonly known as: XALATAN Place 1 drop into both eyes at bedtime.   metoprolol succinate 50 MG 24 hr tablet Commonly known as: TOPROL-XL TAKE ONE AND ONE-HALF TABLETS TWICE A DAY   multivitamin with minerals Tabs tablet Take 1 tablet by mouth daily with breakfast.   potassium chloride SA 20 MEQ tablet Commonly known as: KLOR-CON M Take 2 tablets (40 mEq total) by mouth daily.   prednisoLONE acetate 1 % ophthalmic suspension Commonly known as: PRED FORTE Place 1 drop into the right eye daily.   simvastatin 20 MG tablet Commonly known as: ZOCOR TAKE 1 TABLET AT BEDTIME   spironolactone 25 MG tablet Commonly known as: ALDACTONE Take 1 tablet (25 mg total) by mouth daily.   torsemide 20 MG tablet Commonly known as: DEMADEX Take 3 tablets (60 mg total) by mouth in the morning AND 2 tablets (40 mg total) every evening.   vitamin B-12 500 MCG tablet Commonly known as: CYANOCOBALAMIN Take 500 mcg by mouth daily.   Vitamin D3 25 MCG (1000 UT) Caps Take 1,000 Units by mouth every other day.   Xarelto 20 MG Tabs tablet Generic drug: rivaroxaban TAKE 1 TABLET DAILY WITH SUPPER        Signature:  Chesley Mires, MD [place] Pager - [phone] 03/18/2022, 9:59 AM

## 2022-03-21 ENCOUNTER — Other Ambulatory Visit: Payer: Self-pay

## 2022-03-21 ENCOUNTER — Ambulatory Visit: Payer: Medicare Other

## 2022-03-21 DIAGNOSIS — Z1231 Encounter for screening mammogram for malignant neoplasm of breast: Secondary | ICD-10-CM

## 2022-03-21 DIAGNOSIS — Z170 Estrogen receptor positive status [ER+]: Secondary | ICD-10-CM

## 2022-03-23 NOTE — Progress Notes (Unsigned)
[facility]   Telephone:(336) [phone] Fax:(336) [phone]   Clinic Follow up Note   Patient Care Team: Stephens Shire, MD as PCP - General (Family Medicine) Thompson Grayer, MD as PCP - Electrophysiology (Cardiology) Jettie Booze, MD as PCP - Cardiology (Cardiology) 03/24/2022  CHIEF COMPLAINT: Follow up right breast cancer   CURRENT THERAPY: Surveillance   INTERVAL HISTORY: [name] returns for follow up as scheduled, last seen by Dr. Burr Medico 03/25/21. Left mammogram 11/21/21 was negative.  She has had some ups and downs in her health this year but overall feels good.  Admitted to the hospital earlier this year for CHF which has subsequently been stable, overall 20 pounds weight loss in 6 months she reports is intentional, through better eating, more activity, and diuresis.  Denies left breast or right chest wall concerns, bone or RUQ pain, worsening cough or dyspnea, or any other new specific complaints.  All other systems were reviewed with the patient and are negative.  MEDICAL HISTORY:  Past Medical History:  Diagnosis Date   Arthritis    Asthma    Blood transfusion    hx of last one in 1987   Breast cancer [facility]) 2013   Right Breast Cancer   Cancer of lower-inner quadrant of female breast ([place]) 07/24/2011   right breast cancer /IDC,stage IIB,er/pr=+,her2=Neg   Glaucoma    Heart murmur    History of chemotherapy    taxotere/cytoxan 6 cycles 09/22/11-01/05/12 day 2 neulasta   Hyperlipemia    Hypertension    Lymphedema    right arm wears a sleeve   MRSA (methicillin resistant staph aureus) culture positive    08/2011   Neuromuscular disorder ([place])    peripheral neuropathy hands feet   OSA (obstructive sleep apnea)    CPAP settings- ?    Persistent atrial fibrillation (H[facility])    Personal history of chemotherapy    Personal history of radiation therapy    Pituitary abnormality ([place]) 05/25/2013   small pituitary growth-appears stable- Dr. Christella Noa follows     SURGICAL HISTORY: Past Surgical History:  Procedure Laterality Date   ABDOMINAL HYSTERECTOMY  2007   TAH/BSO   BREAST SURGERY     mastectomy RIGHT   BUBBLE STUDY  09/20/2021   Procedure: BUBBLE STUDY;  Surgeon: Larey Dresser, MD;  Location: [facility];  Service: Cardiovascular;;   CARDIOVERSION N/A 06/02/2017   Procedure: CARDIOVERSION;  Surgeon: Dorothy Spark, MD;  Location: [facility] ENDOSCOPY;  Service: Cardiovascular;  Laterality: N/A;   CARPAL TUNNEL RELEASE Right    2012   CHOLECYSTECTOMY  1987   open - Dr Lindon Romp   COLONOSCOPY N/A 06/13/2013   Procedure: COLONOSCOPY;  Surgeon: Juanita Craver, MD;  Location: [facility] ENDOSCOPY;  Service: Endoscopy;  Laterality: N/A;   DILATION AND CURETTAGE OF UTERUS     x3   EYE SURGERY     laser eye surgery for glaucoma   MASTECTOMY MODIFIED RADICAL Right 08/14/11   right , ER/PR +, HER2 -   port-a-cath insertion     port-a-cath removal 1'14   PORT-A-CATH REMOVAL  06/22/2012   Procedure: MINOR REMOVAL PORT-A-CATH;  Surgeon: Haywood Lasso, MD;  Location: [facility];  Service: General;  Laterality: Left;   PORTACATH PLACEMENT  09/11/2011   Procedure: INSERTION PORT-A-CATH;  Surgeon: Joyice Faster. Cornett, MD;  Location: [facility] ORS;  Service: General;  Laterality: N/A;  Insert of Port   RIGHT HEART CATH N/A 08/28/2021   Procedure: RIGHT HEART CATH;  Surgeon: Larey Dresser, MD;  Location: [facility];  Service: Cardiovascular;  Laterality: N/A;   TEE WITHOUT CARDIOVERSION N/A 09/20/2021   Procedure: TRANSESOPHAGEAL ECHOCARDIOGRAM (TEE);  Surgeon: Larey Dresser, MD;  Location: [facility] ENDOSCOPY;  Service: Cardiovascular;  Laterality: N/A;   TUBAL LIGATION      I have reviewed the social history and family history with the patient and they are unchanged from previous note.  ALLERGIES:  is allergic to trandolapril-verapamil hcl er.  MEDICATIONS:  Current Outpatient Medications  Medication Sig Dispense Refill   acetaminophen  (TYLENOL) 325 MG tablet Take 325-650 mg by mouth every 6 (six) hours as needed (for pain.).     albuterol (PROAIR HFA) 108 (90 Base) MCG/ACT inhaler Inhale 1-2 puffs into the lungs every 6 (six) hours as needed for wheezing or shortness of breath. For shortness of breath 18 g 3   azaTHIOprine (IMURAN) 50 MG tablet Take 100 mg by mouth daily.     cabergoline (DOSTINEX) 0.5 MG tablet Take 0.5 mg by mouth 2 (two) times a week. Tuesday and Thursday     Cholecalciferol (VITAMIN D3) 1000 UNITS CAPS Take 1,000 Units by mouth every other day.      dorzolamide (TRUSOPT) 2 % ophthalmic solution Place 1 drop into both eyes 2 (two) times daily.     empagliflozin (JARDIANCE) 10 MG TABS tablet Take 1 tablet (10 mg total) by mouth daily. 90 tablet 3   GLUCOSAMINE HCL PO Take 1 tablet by mouth daily.     latanoprost (XALATAN) 0.005 % ophthalmic solution Place 1 drop into both eyes at bedtime.     metoprolol succinate (TOPROL-XL) 50 MG 24 hr tablet TAKE ONE AND ONE-HALF TABLETS TWICE A DAY 270 tablet 3   Multiple Vitamin (MULITIVITAMIN WITH MINERALS) TABS Take 1 tablet by mouth daily with breakfast.      potassium chloride SA (KLOR-CON M) 20 MEQ tablet Take 2 tablets (40 mEq total) by mouth daily. 60 tablet 3   prednisoLONE acetate (PRED FORTE) 1 % ophthalmic suspension Place 1 drop into the right eye daily.     simvastatin (ZOCOR) 20 MG tablet TAKE 1 TABLET AT BEDTIME 90 tablet 3   spironolactone (ALDACTONE) 25 MG tablet Take 1 tablet (25 mg total) by mouth daily. 90 tablet 3   torsemide (DEMADEX) 20 MG tablet Take 3 tablets (60 mg total) by mouth in the morning AND 2 tablets (40 mg total) every evening. 540 tablet 3   vitamin B-12 (CYANOCOBALAMIN) 500 MCG tablet Take 500 mcg by mouth daily.     XARELTO 20 MG TABS tablet TAKE 1 TABLET DAILY WITH SUPPER 90 tablet 3   No current facility-administered medications for this visit.   Facility-Administered Medications Ordered in Other Visits  Medication Dose Route  Frequency Provider Last Rate Last Admin   lidocaine (cardiac) 100 mg/35m (XYLOCAINE) 20 MG/ML injection 2%    Anesthesia Intra-op JMontel Clock CRNA   60 mg at 06/02/17 1343    PHYSICAL EXAMINATION: ECOG PERFORMANCE STATUS: 1 - Symptomatic but completely ambulatory  Vitals:   03/24/22 0855  BP: (!) 112/58  Pulse: 69  Resp: 14  Temp: 97.8 F (36.6 C)   Filed Weights   03/24/22 0855  Weight: 249 lb 12.8 oz (113.3 kg)    GENERAL:alert, no distress and comfortable SKIN: No rash EYES: sclera clear LUNGS:  normal breathing effort NEURO: alert & oriented x 3 with fluent speech Breast exam: S/p right mastectomy, incision completely healed.  Small discrete firm nodule palpable at the lateralmost edge of the mastectomy scar.  Left breast without nipple discharge or inversion.  No other palpable mass or nodularity in the left breast, right chest wall, or either axilla that I could appreciate Exam performed in wheelchair  LABORATORY DATA:  I have reviewed the data as listed    Latest Ref Rng & Units 03/24/2022    8:31 AM 10/11/2021   11:56 AM 08/28/2021    2:08 PM  CBC  WBC 4.0 - 10.5 K/uL 5.5  5.6    Hemoglobin 12.0 - 15.0 g/dL 14.0  13.2  12.2    12.6   Hematocrit 36.0 - 46.0 % 42.0  42.8  36.0    37.0   Platelets 150 - 400 K/uL 154  141          Latest Ref Rng & Units 03/24/2022    8:31 AM 02/18/2022   12:32 PM 02/11/2022   12:14 PM  CMP  Glucose 70 - 99 mg/dL 107  101  96   BUN 8 - 23 mg/dL 38  32  24   Creatinine 0.44 - 1.00 mg/dL 1.67  1.55  1.59   Sodium 135 - 145 mmol/L 138  138  139   Potassium 3.5 - 5.1 mmol/L 3.3  4.4  3.9   Chloride 98 - 111 mmol/L 99  104  100   CO2 22 - 32 mmol/L _0 Calcium 8.9 - 10.3 mg/dL 9.4  9.7  9.9   Total Protein 6.5 - 8.1 g/dL 7.5     Total Bilirubin 0.3 - 1.2 mg/dL 2.7     Alkaline Phos 38 - 126 U/L 69     AST 15 - 41 U/L 16     ALT 0 - 44 U/L 10         RADIOGRAPHIC STUDIES: I have personally reviewed the  radiological images as listed and agreed with the findings in the report. No results found.   ASSESSMENT & PLAN: 71 yo female with:   1. Right breast cancer  -diagnosed 08/05/2011 [id number] stage IIB invasive ductal carcinoma  -s/p right simple mastectomy with ALND 08/14/2011 final path revealed invasive and in situ ductal carcinoma, margins negative, focal LVI and 1/15 + LNs, ER/PR + HER2 negative final [id number] stage IIB -s/p adjuvant chemo with taxotere/cytoxan q3 weeks x6 09/22/11 - 01/05/12 -s/p adjuvant radiation completed 03/2012 -s/p adjuvant anastrozole x7 years completed 03/2019 -Ms. Rebman is clinically doing well.  Breast exam reveals a discrete nodule at the lateral right mastectomy scar, likely scar tissue but feels more firm.  Although recurrence 10 years from initial diagnosis is unlikely, I am referring her for an ultrasound to rule out. I will call her with the results.  Otherwise exam is unremarkable -Labs reviewed, CBC is normal, CMP with slight creatinine bump and T. bili of 2.7 which is her baseline. -Left mammo 10/2021 was negative, continue annually -If workup is negative, continue annual surveillance  2.  Hyperbilirubinemia -T. bili range 1.5 - 4, and higher at times -Right upper quadrant ultrasound in 2021 was negative for focal mass -Denies RUQ pain.  Other LFTs remain normal -Follow-up clinically with PCP  3. Bone health -DEXA in 2018, normal  -continue calcium and vit D; she is limited in weightbearing activity   4. OSA, asthma, CKD, A-fib, CHF -She was admitted for CHF exacerbation in 08/2021 with significant diuresis; now stable -20 pounds weight loss in 6  months she attributes to better eating, more activity, and diuresis -Continue follow-up with pulmonology, cardiology, and PCP  PLAN: -June mammogram and today's labs reviewed -Ultrasound of a palpable nodule at the right lateral mastectomy scar, I will call her with results -If [name] is negative, continue  annual surveillance    Orders Placed This Encounter  Procedures   [name] AXILLA RIGHT    Standing Status:   Future    Standing Expiration Date:   03/25/2023    Order Specific Question:   Reason for Exam (SYMPTOM  OR DIAGNOSIS REQUIRED)    Answer:   new firm nodule at lateralmost R mastectomy scar; h/o stage II breast cancer 2013 s/p mastectomy, chemo, RT, and AI    Order Specific Question:   Preferred imaging location?    Answer:   [facility]   All questions were answered. The patient knows to call the clinic with any problems, questions or concerns. No barriers to learning were detected.     Alla Feeling, NP 03/24/22

## 2022-03-24 ENCOUNTER — Other Ambulatory Visit: Payer: Self-pay

## 2022-03-24 ENCOUNTER — Inpatient Hospital Stay: Payer: Medicare Other

## 2022-03-24 ENCOUNTER — Other Ambulatory Visit: Payer: Medicare Other

## 2022-03-24 ENCOUNTER — Encounter: Payer: Self-pay | Admitting: Nurse Practitioner

## 2022-03-24 ENCOUNTER — Ambulatory Visit: Payer: Medicare Other | Admitting: Hematology

## 2022-03-24 ENCOUNTER — Inpatient Hospital Stay: Payer: Medicare Other | Attending: Nurse Practitioner | Admitting: Nurse Practitioner

## 2022-03-24 VITALS — BP 112/58 | HR 69 | Temp 97.8°F | Resp 14 | Wt 249.8 lb

## 2022-03-24 DIAGNOSIS — C50311 Malignant neoplasm of lower-inner quadrant of right female breast: Secondary | ICD-10-CM

## 2022-03-24 DIAGNOSIS — G4733 Obstructive sleep apnea (adult) (pediatric): Secondary | ICD-10-CM | POA: Insufficient documentation

## 2022-03-24 DIAGNOSIS — Z7901 Long term (current) use of anticoagulants: Secondary | ICD-10-CM | POA: Insufficient documentation

## 2022-03-24 DIAGNOSIS — Z853 Personal history of malignant neoplasm of breast: Secondary | ICD-10-CM | POA: Diagnosis present

## 2022-03-24 DIAGNOSIS — Z79631 Long term (current) use of antimetabolite agent: Secondary | ICD-10-CM | POA: Diagnosis not present

## 2022-03-24 DIAGNOSIS — I110 Hypertensive heart disease with heart failure: Secondary | ICD-10-CM | POA: Insufficient documentation

## 2022-03-24 DIAGNOSIS — Z7984 Long term (current) use of oral hypoglycemic drugs: Secondary | ICD-10-CM | POA: Diagnosis not present

## 2022-03-24 DIAGNOSIS — J45909 Unspecified asthma, uncomplicated: Secondary | ICD-10-CM | POA: Diagnosis not present

## 2022-03-24 DIAGNOSIS — N6459 Other signs and symptoms in breast: Secondary | ICD-10-CM | POA: Diagnosis not present

## 2022-03-24 DIAGNOSIS — Z79899 Other long term (current) drug therapy: Secondary | ICD-10-CM | POA: Diagnosis not present

## 2022-03-24 DIAGNOSIS — Z79624 Long term (current) use of inhibitors of nucleotide synthesis: Secondary | ICD-10-CM | POA: Diagnosis not present

## 2022-03-24 DIAGNOSIS — I509 Heart failure, unspecified: Secondary | ICD-10-CM | POA: Diagnosis not present

## 2022-03-24 DIAGNOSIS — Z170 Estrogen receptor positive status [ER+]: Secondary | ICD-10-CM | POA: Diagnosis not present

## 2022-03-24 DIAGNOSIS — I4819 Other persistent atrial fibrillation: Secondary | ICD-10-CM | POA: Insufficient documentation

## 2022-03-24 LAB — CBC WITH DIFFERENTIAL (CANCER CENTER ONLY)
Abs Immature Granulocytes: 0.02 10*3/uL (ref 0.00–0.07)
Basophils Absolute: 0 10*3/uL (ref 0.0–0.1)
Basophils Relative: 1 %
Eosinophils Absolute: 0.1 10*3/uL (ref 0.0–0.5)
Eosinophils Relative: 2 %
HCT: 42 % (ref 36.0–46.0)
Hemoglobin: 14 g/dL (ref 12.0–15.0)
Immature Granulocytes: 0 %
Lymphocytes Relative: 38 %
Lymphs Abs: 2.1 10*3/uL (ref 0.7–4.0)
MCH: 32.4 pg (ref 26.0–34.0)
MCHC: 33.3 g/dL (ref 30.0–36.0)
MCV: 97.2 fL (ref 80.0–100.0)
Monocytes Absolute: 0.5 10*3/uL (ref 0.1–1.0)
Monocytes Relative: 9 %
Neutro Abs: 2.7 10*3/uL (ref 1.7–7.7)
Neutrophils Relative %: 50 %
Platelet Count: 154 10*3/uL (ref 150–400)
RBC: 4.32 MIL/uL (ref 3.87–5.11)
RDW: 11.9 % (ref 11.5–15.5)
WBC Count: 5.5 10*3/uL (ref 4.0–10.5)
nRBC: 0 % (ref 0.0–0.2)

## 2022-03-24 LAB — CMP (CANCER CENTER ONLY)
ALT: 10 U/L (ref 0–44)
AST: 16 U/L (ref 15–41)
Albumin: 4.2 g/dL (ref 3.5–5.0)
Alkaline Phosphatase: 69 U/L (ref 38–126)
Anion gap: 12 (ref 5–15)
BUN: 38 mg/dL — ABNORMAL HIGH (ref 8–23)
CO2: 27 mmol/L (ref 22–32)
Calcium: 9.4 mg/dL (ref 8.9–10.3)
Chloride: 99 mmol/L (ref 98–111)
Creatinine: 1.67 mg/dL — ABNORMAL HIGH (ref 0.44–1.00)
GFR, Estimated: 32 mL/min — ABNORMAL LOW (ref >60)
Glucose, Bld: 107 mg/dL — ABNORMAL HIGH (ref 70–99)
Potassium: 3.3 mmol/L — ABNORMAL LOW (ref 3.5–5.1)
Sodium: 138 mmol/L (ref 135–145)
Total Bilirubin: 2.7 mg/dL — ABNORMAL HIGH (ref 0.3–1.2)
Total Protein: 7.5 g/dL (ref 6.5–8.1)

## 2022-03-25 NOTE — Progress Notes (Signed)
Advanced Heart Failure Clinic Note    PCP: Stephens Shire, MD  Primary Cardiologist: Larae Grooms, MD HF Cardiology: Dr Aundra Dubin  HPI: 71 y.o. female w/ chronic diastolic heart failure, RV dysfunction, pulmonary HTN, persistent atrial fibrillation, MR/TR, HTN, HLD, h/o rt sided breast cancer in 2013 tx w/ mastectomy, chemo + radiation, CKD stage IIIb and peripheral neuropathy,  w/ recent admit for a/c diastolic heart failure. Evaluated by cardiology and diuresed w/ IV Lasix. Was noted to be in rate controlled afib, persistent. Echo in 3/23 showed hyperdynamic LVEF, 65-70%, D shaped LV c/w RV volume overload, Grade II DD w/ mod LVH, mildly reduced RV and severely elevated RVSP, 58 mmHg, moderate MR w/ severe BAE and severe TR.   After diuresis, was switched back to oral lasix + Jardiance. Continued on Xarelto and metoprolol for afib. Referred to [facility] clinic. D/c Wt 279 lb. Reports full compliance w/ CPAP. Fully compliant w/ Xarelto. Denies h/o PE/DVT. No family h/o HF. Denies CP. Non smoker. + h/o asthma but no h/o COPD. No known h/o connective tissue disorder.    RHC was arranged from [facility] clinic, done in 4/23.  This showed markedly elevated right and left heart filling pressures with prominent v-waves in the PCWP tracing.  PVR not elevated, pulmonary venous hypertension.  Lasix was increased.   She was seen post cath 4/23 and remained fluid overloaded. Lasix was stopped. Switched to torsemide 60 mg bid. Amyloid work up arranged. Multiple Myeloma negative for M spike protein. Immunofixation unremarkable. She was also set up for TEE to better assess her mitral valve. This showed moderate MR w/ restriction of the posterior leaflet. ERO  0.13 cm^2 by PISA, vena contract area 0.21 cm^2. Also w/ mod-severe TR, normal LVEF 60-65% + mildly D-shaped interventricular septum suggestive of  RV pressure/volume overload.  Follow up 5/23, mild volume overload with NYHA II symptoms. Spiro 12.5 started.    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 Cardiac MRI in 7/23, however, was not suggestive of cardiac amyloidosis: LV EF 59%, no LGE, ECV 30%, moderate RV enlargement with RVEF 29%, biatrial enlargement, mild MR. [employer] gene testing was negative for hATTR.   Today she returns for HF follow up with her husband. She is using CPAP.  She is short of breath when walking about 100 feet.  She uses a cane. No chest pain.  No lightheadedness. She does not feel tingling/numbness in her feet. Weight down 2 lbs.    Labs (4/23): K 3.6, creatinine 1.21, hgb 11.6 Labs (4/23): Multiple Myeloma no M-spike protein, Immunofixation negative  Labs (5/23): K 4.7, creatinine 1.28, hgb 13.2 Labs (6/23): K 4, creatinine 1.35, SPEP negative, urine immunofixation negative.   ECG (personally reviewed): Atrial fibrillation rate 87  PMH: 1. CKD stage 3 2. OSA: uses CPAP.  3. Atrial fibrillation: Chronic.  It appears that she has been in AF since at least 2018.  4. Breast cancer on right in 2013: Taxotere/Cytoxan chemotherapy.  5. Peripheral neuropathy.  6. Chronic diastolic CHF with prominent RV failure: Echo in 3/23 with EF 65-70%, moderate LVH, D-shaped septum, mild RVE with mildly decreased systolic function, PASP 58, severe biatrial enlargement, at least moderate MR, severe TR, mild-moderate AI, IVC dilated.  - RHC (4/23): mean RA 22, PA 69/31 mean 46, mean PCWP 30 with v-waves to 44, CI 2.82, PVR 2.6 WU.  - TEE (4/23): moderate MR w/ restriction of the posterior leaflet. ERO  0.13  cm^2 by PISA, vena contract area 0.21 cm^2. Also w/ mod-severe TR, normal LVEF 60-65% + mildly D-shaped interventricular septum suggestive of  RV pressure/volume overload. - PYP (5/23) strongly suggestive of cardiac amyloidosis, grade 2, H/CL= 1.42 - Cardiac MRI (7/23): LV EF 59%, no LGE, ECV 30%, moderate RV enlargement with RVEF 29%, biatrial enlargement, mild MR. 7.  CKD stage 3  Review of Systems: All systems reviewed and negative except as per HPI.   Current Outpatient Medications  Medication Sig Dispense Refill   acetaminophen (TYLENOL) 325 MG tablet Take 325-650 mg by mouth every 6 (six) hours as needed (for pain.).     albuterol (PROAIR HFA) 108 (90 Base) MCG/ACT inhaler Inhale 1-2 puffs into the lungs every 6 (six) hours as needed for wheezing or shortness of breath. For shortness of breath 18 g 3   azaTHIOprine (IMURAN) 50 MG tablet Take 100 mg by mouth daily.     cabergoline (DOSTINEX) 0.5 MG tablet Take 0.5 mg by mouth 2 (two) times a week. Tuesday and Thursday     Cholecalciferol (VITAMIN D3) 1000 UNITS CAPS Take 1,000 Units by mouth every other day.      dorzolamide (TRUSOPT) 2 % ophthalmic solution Place 1 drop into both eyes 2 (two) times daily.     empagliflozin (JARDIANCE) 10 MG TABS tablet Take 1 tablet (10 mg total) by mouth daily. 90 tablet 3   GLUCOSAMINE HCL PO Take 1 tablet by mouth daily.     latanoprost (XALATAN) 0.005 % ophthalmic solution Place 1 drop into both eyes at bedtime.     metoprolol succinate (TOPROL-XL) 50 MG 24 hr tablet TAKE ONE AND ONE-HALF TABLETS TWICE A DAY 270 tablet 3   Multiple Vitamin (MULITIVITAMIN WITH MINERALS) TABS Take 1 tablet by mouth daily with breakfast.      potassium chloride SA (KLOR-CON M) 20 MEQ tablet Take 2 tablets (40 mEq total) by mouth daily. 60 tablet 3   prednisoLONE acetate (PRED FORTE) 1 % ophthalmic suspension Place 1 drop into the right eye daily.     simvastatin (ZOCOR) 20 MG tablet TAKE 1 TABLET AT BEDTIME 90 tablet 3   spironolactone (ALDACTONE) 25 MG tablet Take 1 tablet (25 mg total) by mouth daily. 90 tablet 3   torsemide (DEMADEX) 20 MG tablet Take 3 tablets (60 mg total) by mouth in the morning AND 2 tablets (40 mg total) every evening. 540 tablet 3   vitamin B-12 (CYANOCOBALAMIN) 500 MCG tablet Take 500 mcg by mouth daily.     XARELTO 20 MG TABS tablet TAKE 1 TABLET DAILY WITH  SUPPER 90 tablet 3   No current facility-administered medications for this visit.   Facility-Administered Medications Ordered in Other Visits  Medication Dose Route Frequency Provider Last Rate Last Admin   lidocaine (cardiac) 100 mg/35m (XYLOCAINE) 20 MG/ML injection 2%    Anesthesia Intra-op JMontel Clock CRNA   60 mg at 06/02/17 1343   Allergies  Allergen Reactions   Trandolapril-Verapamil Hcl Er Swelling    Causes lips to swell ([name] is the name brand) Tolerates amlodipine   Social History   Socioeconomic History   Marital status: Married    Spouse name: Not on file   Number of children: Not on file   Years of education: Not on file   Highest education level: Not on file  Occupational History   Not on file  Tobacco Use   Smoking status: Never   Smokeless tobacco: Never  Vaping Use  Vaping Use: Never used  Substance and Sexual Activity   Alcohol use: No   Drug use: No   Sexual activity: Yes    Birth control/protection: Post-menopausal, Surgical  Other Topics Concern   Not on file  Social History Narrative   Not on file   Social Determinants of Health   Financial Resource Strain: Not on file  Food Insecurity: Not on file  Transportation Needs: Not on file  Physical Activity: Not on file  Stress: Not on file  Social Connections: Not on file  Intimate Partner Violence: Not on file   Family History  Problem Relation Age of Onset   Arthritis Mother    Cancer Father 36       prostate ca   Breast cancer Neg Hx    Wt Readings from Last 3 Encounters:  03/24/22 113.3 kg (249 lb 12.8 oz)  03/18/22 113.1 kg (249 lb 6.4 oz)  03/07/22 114.8 kg (253 lb)   There were no vitals taken for this visit.  PHYSICAL EXAM: General: NAD Neck: JVP 8-9 cm, no thyromegaly or thyroid nodule.  Lungs: Clear to auscultation bilaterally with normal respiratory effort. CV: Nondisplaced PMI.  Heart irregular S1/S2, no S3/S4, no murmur.  1+ ankle edema.  No carotid bruit.   Normal pedal pulses.  Abdomen: Soft, nontender, no hepatosplenomegaly, no distention.  Skin: Intact without lesions or rashes.  Neurologic: Alert and oriented x 3.  Psych: Normal affect. Extremities: No clubbing or cyanosis.  HEENT: Normal.   ASSESSMENT & PLAN: 1. Chronic diastolic CHF with prominent RV dysfunction: Echo in 3/23 with  EF 65-70%, moderate LVH, D-shaped septum, mild RVE with mildly decreased systolic function, PASP 58, severe biatrial enlargement, at least moderate MR, severe TR, mild-moderate AI, IVC dilated. [place] in 4/23 showed markedly elevated right and left heart filling pressures with prominent v-waves in the PCWP tracing.  PVR not elevated, pulmonary venous hypertension.  LV and RV failure components with valvular heart disease.  Chronic atrial fibrillation may have been a major driver for this. Multiple Myeloma study and immunofixation both normal. PYP 5/23 strongly suggestive of cardiac amyloidosis. However, cardiac MRI was NOT suggestive of cardiac amyloidosis with no LGE and no significant ECV percentage elevation.  [employer]ae gene testing was negative for hATTR.  Cardiac amyloidosis diagnosis still remains uncertain.  NYHA class III symptoms, she is mildly volume overloaded on exam.  - Increase torsemide to 60 mg bid x 3 days, then 60 qam/40 qpm after this.  BMET/BNP today and BMET in 10 days.  - Continue spironolactone 25 mg daily.  - Continue empagliflozin 10 mg daily. No GU symptoms. - No Entresto (h/o angioedema w/ ACEi).  - Continue Toprol XL 75 mg bid.  - I am going to repeat PYP scan.  If this is suggestive of ATTR cardiac amyloidosis, I will arrange for fat pad biopsy.  If this is negative for amyloid, consider endomyocardial biopsy at [facility] to confirm. If repeat PYP scan is not suggestive of ATTR cardiac amyloidosis, no further workup.  2. Pulmonary hypertension: This appears to be pulmonary venous hypertension.  Treatment will be diuresis.  3. Permanent atrial  fibrillation: Severe biatrial enlargement, AF present for several years. She is well rate controlled. Unlikely to successfully cardiovert even with anti-arrhythmic given severe LAE and chronicity.  - Continue Xarelto.  4. OSA: Continue CPAP.  5. Mitral Regurgitation: Possible atrial functional MR with severe LAE. TEE 4/23 showed moderate MR w/ restriction of the posterior leaflet. ERO  0.13  cm^2 by PISA, vena contract area 0.21 cm^2.  - HF optimization per above  5. Tricuspid Regurgitation: Mod-severe on TEE 4/23.  Medically manage for now, may be candidate down the road for percutaneous repair.  6. CKD: Stage 3. BMET today. - Continue empagliflozin  Follow up in 6 weeks with APP  Rafael Bihari,  03/25/2022

## 2022-03-26 ENCOUNTER — Ambulatory Visit (HOSPITAL_COMMUNITY)
Admission: RE | Admit: 2022-03-26 | Discharge: 2022-03-26 | Disposition: A | Discharge status: Home / Self Care | Payer: Medicare Other | Source: Ambulatory Visit | Attending: Family Medicine | Admitting: Family Medicine

## 2022-03-26 ENCOUNTER — Encounter (HOSPITAL_COMMUNITY): Payer: Self-pay

## 2022-03-26 VITALS — BP 100/64 | HR 58 | Wt 251.4 lb

## 2022-03-26 DIAGNOSIS — Z79899 Other long term (current) drug therapy: Secondary | ICD-10-CM | POA: Diagnosis not present

## 2022-03-26 DIAGNOSIS — I130 Hypertensive heart and chronic kidney disease with heart failure and stage 1 through stage 4 chronic kidney disease, or unspecified chronic kidney disease: Secondary | ICD-10-CM | POA: Diagnosis present

## 2022-03-26 DIAGNOSIS — Z7901 Long term (current) use of anticoagulants: Secondary | ICD-10-CM | POA: Diagnosis not present

## 2022-03-26 DIAGNOSIS — I4821 Permanent atrial fibrillation: Secondary | ICD-10-CM | POA: Diagnosis not present

## 2022-03-26 DIAGNOSIS — I272 Pulmonary hypertension, unspecified: Secondary | ICD-10-CM | POA: Insufficient documentation

## 2022-03-26 DIAGNOSIS — G4733 Obstructive sleep apnea (adult) (pediatric): Secondary | ICD-10-CM | POA: Insufficient documentation

## 2022-03-26 DIAGNOSIS — Z7984 Long term (current) use of oral hypoglycemic drugs: Secondary | ICD-10-CM | POA: Diagnosis not present

## 2022-03-26 DIAGNOSIS — N183 Chronic kidney disease, stage 3 unspecified: Secondary | ICD-10-CM

## 2022-03-26 DIAGNOSIS — N1832 Chronic kidney disease, stage 3b: Secondary | ICD-10-CM | POA: Insufficient documentation

## 2022-03-26 DIAGNOSIS — I482 Chronic atrial fibrillation, unspecified: Secondary | ICD-10-CM | POA: Diagnosis not present

## 2022-03-26 DIAGNOSIS — I340 Nonrheumatic mitral (valve) insufficiency: Secondary | ICD-10-CM

## 2022-03-26 DIAGNOSIS — I5032 Chronic diastolic (congestive) heart failure: Secondary | ICD-10-CM | POA: Diagnosis present

## 2022-03-26 DIAGNOSIS — I081 Rheumatic disorders of both mitral and tricuspid valves: Secondary | ICD-10-CM | POA: Diagnosis not present

## 2022-03-26 MED ORDER — TORSEMIDE 20 MG PO TABS: 60.0000 mg | ORAL_TABLET | Freq: Two times a day (BID) | ORAL | 2 refills | Status: DC

## 2022-03-26 MED ORDER — POTASSIUM CHLORIDE CRYS ER 20 MEQ PO TBCR: 40.0000 meq | EXTENDED_RELEASE_TABLET | Freq: Two times a day (BID) | ORAL | 3 refills | Status: DC

## 2022-03-26 NOTE — Patient Instructions (Signed)
Thank you for coming in today  Your physician recommends that you return for lab work in:  10-14 day BNP BMET  Your physician recommends that you schedule a follow-up appointment in:  3-4 months with Dr. Aundra Dubin    Do the following things EVERYDAY: Weigh yourself in the morning before breakfast. Write it down and keep it in a log. Take your medicines as prescribed Eat low salt foods--Limit salt (sodium) to 2000 mg per day.  Stay as active as you can everyday Limit all fluids for the day to less than 2 liters  At the [facility], you and your health needs are our priority. As part of our continuing mission to provide you with exceptional heart care, we have created designated Provider Care Teams. These Care Teams include your primary Cardiologist (physician) and Advanced Practice Providers (APPs- Physician Assistants and Nurse Practitioners) who all work together to provide you with the care you need, when you need it.   You may see any of the following providers on your designated Care Team at your next follow up: Dr Glori Bickers Dr Loralie Champagne Dr. Roxana Hires, NP Lyda Jester, [place] [facility], [place] Forestine Na, NP Audry Riles, PharmD   Please be sure to bring in all your medications bottles to every appointment.   If you have any questions or concerns before your next appointment please send [name] a message through [place] or call our office at [phone].    TO LEAVE A MESSAGE FOR THE NURSE SELECT OPTION 2, PLEASE LEAVE A MESSAGE INCLUDING: YOUR NAME DATE OF BIRTH CALL BACK NUMBER REASON FOR CALL**this is important as we prioritize the call backs  YOU WILL RECEIVE A CALL BACK THE SAME DAY AS LONG AS YOU CALL BEFORE 4:00 PM

## 2022-04-03 ENCOUNTER — Other Ambulatory Visit: Payer: Self-pay | Admitting: Nurse Practitioner

## 2022-04-03 ENCOUNTER — Ambulatory Visit: Admission: RE | Admit: 2022-04-03 | Payer: Medicare Other | Source: Ambulatory Visit

## 2022-04-03 ENCOUNTER — Ambulatory Visit
Admission: RE | Admit: 2022-04-03 | Discharge: 2022-04-03 | Disposition: A | Discharge status: Home / Self Care | Payer: Medicare Other | Source: Ambulatory Visit | Attending: Nurse Practitioner | Admitting: Nurse Practitioner

## 2022-04-03 DIAGNOSIS — C50311 Malignant neoplasm of lower-inner quadrant of right female breast: Secondary | ICD-10-CM

## 2022-04-03 DIAGNOSIS — N6459 Other signs and symptoms in breast: Secondary | ICD-10-CM

## 2022-04-07 ENCOUNTER — Telehealth: Payer: Self-pay

## 2022-04-07 NOTE — Telephone Encounter (Signed)
Notified patient of imagine results

## 2022-04-07 NOTE — Telephone Encounter (Signed)
-----   Message from Alla Feeling, NP sent at 04/04/2022 12:25 PM EST ----- Please let pt know the ultrasound of the R mastectomy scar showed benign tissue similar to scar tissue, and a fluid collection called a seroma. No evidence of cancer! I will see her back in 03/2023 as planned, or sooner if she has questions/concerns.  Thanks, Regan Rakers NP

## 2022-04-08 ENCOUNTER — Other Ambulatory Visit (HOSPITAL_COMMUNITY): Payer: Medicare Other

## 2022-04-09 ENCOUNTER — Telehealth: Payer: Self-pay

## 2022-04-09 NOTE — Telephone Encounter (Addendum)
Called patient to advise of message below. Patient voiced appreciation.   ----- Message from Alla Feeling, NP sent at 04/04/2022 12:25 PM EST ----- Please let pt know the ultrasound of the R mastectomy scar showed benign tissue similar to scar tissue, and a fluid collection called a seroma. No evidence of cancer! I will see her back in 03/2023 as planned, or sooner if she has questions/concerns.  Thanks, Regan Rakers NP

## 2022-04-14 ENCOUNTER — Ambulatory Visit (HOSPITAL_COMMUNITY)
Admission: RE | Admit: 2022-04-14 | Discharge: 2022-04-14 | Disposition: A | Discharge status: Home / Self Care | Payer: Medicare Other | Source: Ambulatory Visit | Attending: Internal Medicine | Admitting: Internal Medicine

## 2022-04-14 DIAGNOSIS — I5032 Chronic diastolic (congestive) heart failure: Secondary | ICD-10-CM | POA: Diagnosis not present

## 2022-04-14 LAB — BASIC METABOLIC PANEL
Anion gap: 11 (ref 5–15)
BUN: 45 mg/dL — ABNORMAL HIGH (ref 8–23)
CO2: 20 mmol/L — ABNORMAL LOW (ref 22–32)
Calcium: 9.7 mg/dL (ref 8.9–10.3)
Chloride: 106 mmol/L (ref 98–111)
Creatinine, Ser: 2.05 mg/dL — ABNORMAL HIGH (ref 0.44–1.00)
GFR, Estimated: 25 mL/min — ABNORMAL LOW (ref >60)
Glucose, Bld: 94 mg/dL (ref 70–99)
Potassium: 4.5 mmol/L (ref 3.5–5.1)
Sodium: 137 mmol/L (ref 135–145)

## 2022-04-14 LAB — BRAIN NATRIURETIC PEPTIDE: B Natriuretic Peptide: 212.8 pg/mL — ABNORMAL HIGH (ref 0.0–100.0)

## 2022-04-15 ENCOUNTER — Telehealth (HOSPITAL_COMMUNITY): Payer: Self-pay | Admitting: Cardiology

## 2022-04-15 DIAGNOSIS — I5032 Chronic diastolic (congestive) heart failure: Secondary | ICD-10-CM

## 2022-04-15 MED ORDER — TORSEMIDE 20 MG PO TABS: ORAL_TABLET | ORAL | 3 refills | Status: DC

## 2022-04-15 NOTE — Telephone Encounter (Signed)
-----   Message from Rafael Bihari, [place] sent at 04/14/2022  1:42 PM EST ----- Kidney function elevated.   Decrease torsemide back to 60 mg q am and 40 mg qpm.  Repeat BMET in 7-10 days.

## 2022-04-15 NOTE — Telephone Encounter (Signed)
Patient called.  Patient aware.  

## 2022-04-24 ENCOUNTER — Ambulatory Visit (HOSPITAL_COMMUNITY)
Admission: RE | Admit: 2022-04-24 | Discharge: 2022-04-24 | Disposition: A | Discharge status: Home / Self Care | Payer: Medicare Other | Source: Ambulatory Visit | Attending: Cardiology | Admitting: Cardiology

## 2022-04-24 DIAGNOSIS — I5032 Chronic diastolic (congestive) heart failure: Secondary | ICD-10-CM | POA: Diagnosis not present

## 2022-04-24 LAB — BASIC METABOLIC PANEL
Anion gap: 6 (ref 5–15)
BUN: 36 mg/dL — ABNORMAL HIGH (ref 8–23)
CO2: 24 mmol/L (ref 22–32)
Calcium: 9.3 mg/dL (ref 8.9–10.3)
Chloride: 108 mmol/L (ref 98–111)
Creatinine, Ser: 1.6 mg/dL — ABNORMAL HIGH (ref 0.44–1.00)
GFR, Estimated: 34 mL/min — ABNORMAL LOW (ref >60)
Glucose, Bld: 96 mg/dL (ref 70–99)
Potassium: 3.9 mmol/L (ref 3.5–5.1)
Sodium: 138 mmol/L (ref 135–145)

## 2022-05-09 ENCOUNTER — Other Ambulatory Visit (HOSPITAL_BASED_OUTPATIENT_CLINIC_OR_DEPARTMENT_OTHER): Payer: Self-pay

## 2022-05-09 MED ORDER — BOOSTRIX 5-2.5-18.5 LF-MCG/0.5 IM SUSY
PREFILLED_SYRINGE | INTRAMUSCULAR | 0 refills | Status: DC
  Filled 2022-05-09 (×2): qty 0.5, 1d supply, fill #0

## 2022-05-22 ENCOUNTER — Emergency Department (HOSPITAL_BASED_OUTPATIENT_CLINIC_OR_DEPARTMENT_OTHER): Payer: Medicare Other | Admitting: Radiology

## 2022-05-22 DIAGNOSIS — Z79899 Other long term (current) drug therapy: Secondary | ICD-10-CM | POA: Insufficient documentation

## 2022-05-22 DIAGNOSIS — Z7901 Long term (current) use of anticoagulants: Secondary | ICD-10-CM | POA: Insufficient documentation

## 2022-05-22 DIAGNOSIS — Z853 Personal history of malignant neoplasm of breast: Secondary | ICD-10-CM | POA: Insufficient documentation

## 2022-05-22 DIAGNOSIS — J189 Pneumonia, unspecified organism: Secondary | ICD-10-CM | POA: Diagnosis not present

## 2022-05-22 DIAGNOSIS — I10 Essential (primary) hypertension: Secondary | ICD-10-CM | POA: Diagnosis not present

## 2022-05-22 DIAGNOSIS — R0602 Shortness of breath: Secondary | ICD-10-CM | POA: Diagnosis present

## 2022-05-22 DIAGNOSIS — Z955 Presence of coronary angioplasty implant and graft: Secondary | ICD-10-CM | POA: Insufficient documentation

## 2022-05-22 DIAGNOSIS — Z20822 Contact with and (suspected) exposure to covid-19: Secondary | ICD-10-CM | POA: Insufficient documentation

## 2022-05-22 DIAGNOSIS — J45909 Unspecified asthma, uncomplicated: Secondary | ICD-10-CM | POA: Diagnosis not present

## 2022-05-22 DIAGNOSIS — J209 Acute bronchitis, unspecified: Secondary | ICD-10-CM | POA: Insufficient documentation

## 2022-05-22 DIAGNOSIS — I4891 Unspecified atrial fibrillation: Secondary | ICD-10-CM | POA: Insufficient documentation

## 2022-05-22 LAB — RESP PANEL BY RT-PCR (RSV, FLU A&B, COVID)  RVPGX2
Influenza A by PCR: NEGATIVE
Influenza B by PCR: NEGATIVE
Resp Syncytial Virus by PCR: NEGATIVE
SARS Coronavirus 2 by RT PCR: NEGATIVE

## 2022-05-22 NOTE — ED Triage Notes (Signed)
Cough and SOB x 1 day, denies fever. Bilateral rib pain when coughing.

## 2022-05-23 ENCOUNTER — Encounter (HOSPITAL_BASED_OUTPATIENT_CLINIC_OR_DEPARTMENT_OTHER): Payer: Self-pay

## 2022-05-23 ENCOUNTER — Emergency Department (HOSPITAL_BASED_OUTPATIENT_CLINIC_OR_DEPARTMENT_OTHER)
Admission: EM | Admit: 2022-05-23 | Discharge: 2022-05-23 | Disposition: A | Discharge status: Home / Self Care | Payer: Medicare Other | Attending: Emergency Medicine | Admitting: Emergency Medicine

## 2022-05-23 DIAGNOSIS — J209 Acute bronchitis, unspecified: Secondary | ICD-10-CM

## 2022-05-23 DIAGNOSIS — J189 Pneumonia, unspecified organism: Secondary | ICD-10-CM

## 2022-05-23 MED ORDER — DOXYCYCLINE HYCLATE 100 MG PO TABS
100.0000 mg | ORAL_TABLET | Freq: Once | ORAL | Status: AC
  Administered 2022-05-23: 100 mg via ORAL
  Filled 2022-05-23: qty 1

## 2022-05-23 MED ORDER — HYDROCOD POLI-CHLORPHE POLI ER 10-8 MG/5ML PO SUER
5.0000 mL | Freq: Once | ORAL | Status: AC
  Administered 2022-05-23: 5 mL via ORAL
  Filled 2022-05-23: qty 5

## 2022-05-23 MED ORDER — HYDROCOD POLI-CHLORPHE POLI ER 10-8 MG/5ML PO SUER: 5.0000 mL | Freq: Two times a day (BID) | ORAL | 0 refills | Status: DC | PRN

## 2022-05-23 MED ORDER — AEROCHAMBER PLUS FLO-VU MISC
1.0000 | Freq: Once | Status: AC
  Administered 2022-05-23: 1
  Filled 2022-05-23: qty 1

## 2022-05-23 MED ORDER — ALBUTEROL SULFATE HFA 108 (90 BASE) MCG/ACT IN AERS
2.0000 | INHALATION_SPRAY | RESPIRATORY_TRACT | Status: DC | PRN
  Administered 2022-05-23: 2 via RESPIRATORY_TRACT
  Filled 2022-05-23: qty 6.7

## 2022-05-23 MED ORDER — AMOXICILLIN-POT CLAVULANATE 875-125 MG PO TABS
1.0000 | ORAL_TABLET | Freq: Once | ORAL | Status: AC
  Administered 2022-05-23: 1 via ORAL
  Filled 2022-05-23: qty 1

## 2022-05-23 MED ORDER — AMOXICILLIN-POT CLAVULANATE 875-125 MG PO TABS: 1.0000 | ORAL_TABLET | Freq: Two times a day (BID) | ORAL | 0 refills | Status: DC

## 2022-05-23 MED ORDER — DOXYCYCLINE HYCLATE 100 MG PO CAPS: 100.0000 mg | ORAL_CAPSULE | Freq: Two times a day (BID) | ORAL | 0 refills | Status: DC

## 2022-05-23 NOTE — ED Notes (Signed)
Pt agreeable with d/c plan as discussed by provider- this nurse has verbally reinforced d/c instructions and provided pt with written copy.  Pt acknowledges verbal understanding and denies any addl questions, concerns, needs; accompanied home by spouse

## 2022-05-23 NOTE — ED Notes (Signed)
ED Provider at bedside. 

## 2022-05-23 NOTE — ED Provider Notes (Signed)
[facility] Provider Note: Georgena Spurling, MD, FACEP  CSN: [id number] MRN: [id number] ARRIVAL: 05/22/22 at 2043 ROOM: DB001/DB001   CHIEF COMPLAINT  Shortness of Breath and Cough   HISTORY OF PRESENT ILLNESS  05/23/22 3:26 AM [name] is a 71 y.o. female with atrial fibrillation on Xarelto.  She is here with cough and shortness of breath that started a week ago.  She is not aware of having had a fever with it.  She is now having bilateral rib pain when she coughs.  She rates her pain as an 8 out of 10.  She has an albuterol inhaler at home but believes it is almost empty.   Past Medical History:  Diagnosis Date   Arthritis    Asthma    Blood transfusion    hx of last one in 1987   Breast cancer [facility]) 2013   Right Breast Cancer   Cancer of lower-inner quadrant of female breast ([place]) 07/24/2011   right breast cancer /IDC,stage IIB,er/pr=+,her2=Neg   Glaucoma    Heart murmur    History of chemotherapy    taxotere/cytoxan 6 cycles 09/22/11-01/05/12 day 2 neulasta   Hyperlipemia    Hypertension    Lymphedema    right arm wears a sleeve   MRSA (methicillin resistant staph aureus) culture positive    08/2011   Neuromuscular disorder ([place])    peripheral neuropathy hands feet   OSA (obstructive sleep apnea)    CPAP settings- ?    Persistent atrial fibrillation (HCC)    Personal history of chemotherapy    Personal history of radiation therapy    Pituitary abnormality ([place]) 05/25/2013   small pituitary growth-appears stable- Dr. Christella Noa follows    Past Surgical History:  Procedure Laterality Date   ABDOMINAL HYSTERECTOMY  2007   TAH/BSO   BREAST SURGERY     mastectomy RIGHT   BUBBLE STUDY  09/20/2021   Procedure: BUBBLE STUDY;  Surgeon: Larey Dresser, MD;  Location: [place];  Service: Cardiovascular;;   CARDIOVERSION N/A 06/02/2017   Procedure: CARDIOVERSION;  Surgeon: Dorothy Spark, MD;  Location: [place];  Service: Cardiovascular;   Laterality: N/A;   CARPAL TUNNEL RELEASE Right    2012   CHOLECYSTECTOMY  1987   open - Dr Lindon Romp   COLONOSCOPY N/A 06/13/2013   Procedure: COLONOSCOPY;  Surgeon: Juanita Craver, MD;  Location: [facility];  Service: Endoscopy;  Laterality: N/A;   DILATION AND CURETTAGE OF UTERUS     x3   EYE SURGERY     laser eye surgery for glaucoma   MASTECTOMY MODIFIED RADICAL Right 08/14/11   right , ER/PR +, HER2 -   port-a-cath insertion     port-a-cath removal 1'14   PORT-A-CATH REMOVAL  06/22/2012   Procedure: MINOR REMOVAL PORT-A-CATH;  Surgeon: Haywood Lasso, MD;  Location: [place];  Service: General;  Laterality: Left;   PORTACATH PLACEMENT  09/11/2011   Procedure: INSERTION PORT-A-CATH;  Surgeon: Joyice Faster. Cornett, MD;  Location: [facility];  Service: General;  Laterality: N/A;  Insert of Port   RIGHT HEART CATH N/A 08/28/2021   Procedure: RIGHT HEART CATH;  Surgeon: Larey Dresser, MD;  Location: [place] CV [facility];  Service: Cardiovascular;  Laterality: N/A;   TEE WITHOUT CARDIOVERSION N/A 09/20/2021   Procedure: TRANSESOPHAGEAL ECHOCARDIOGRAM (TEE);  Surgeon: Larey Dresser, MD;  Location: [facility] ENDOSCOPY;  Service: Cardiovascular;  Laterality: N/A;   TUBAL LIGATION      Family History  Problem Relation Age of Onset   Arthritis Mother    Cancer Father 33       prostate ca   Breast cancer Neg Hx     Social History   Tobacco Use   Smoking status: Never   Smokeless tobacco: Never  Vaping Use   Vaping Use: Never used  Substance Use Topics   Alcohol use: No   Drug use: No    Prior to Admission medications   Medication Sig Start Date End Date Taking? Authorizing Provider  amoxicillin-clavulanate (AUGMENTIN) 875-125 MG tablet Take 1 tablet by mouth every 12 (twelve) hours. 05/23/22  Yes Radonna Bracher, MD  chlorpheniramine-HYDROcodone (TUSSIONEX) 10-8 MG/5ML Take 5 mLs by mouth every 12 (twelve) hours as needed for cough. 05/23/22  Yes Chaney Maclaren, MD  doxycycline  (VIBRAMYCIN) 100 MG capsule Take 1 capsule (100 mg total) by mouth 2 (two) times daily. One po bid x 7 days 05/23/22  Yes Makiyah Zentz, MD  acetaminophen (TYLENOL) 325 MG tablet Take 325-650 mg by mouth every 6 (six) hours as needed (for pain.).    [provider]  albuterol (PROAIR HFA) 108 (90 Base) MCG/ACT inhaler Inhale 1-2 puffs into the lungs every 6 (six) hours as needed for wheezing or shortness of breath. For shortness of breath 03/28/20   Parrett, Fonnie Mu, NP  azaTHIOprine (IMURAN) 50 MG tablet Take 100 mg by mouth daily.    [provider]  cabergoline (DOSTINEX) 0.5 MG tablet Take 0.5 mg by mouth 2 (two) times a week. Tuesday and Thursday    [provider]  Cholecalciferol (VITAMIN D3) 1000 UNITS CAPS Take 1,000 Units by mouth every other day.     [provider]  dorzolamide (TRUSOPT) 2 % ophthalmic solution Place 1 drop into both eyes 2 (two) times daily. 09/09/21   [provider]  empagliflozin (JARDIANCE) 10 MG TABS tablet Take 1 tablet (10 mg total) by mouth daily. 08/26/21   Jettie Booze, MD  GLUCOSAMINE HCL PO Take 1 tablet by mouth daily.    [provider]  latanoprost (XALATAN) 0.005 % ophthalmic solution Place 1 drop into both eyes at bedtime. 09/04/21   [provider]  metoprolol succinate (TOPROL-XL) 50 MG 24 hr tablet TAKE ONE AND ONE-HALF TABLETS TWICE A DAY 01/15/21   Jettie Booze, MD  Multiple Vitamin (MULITIVITAMIN WITH MINERALS) TABS Take 1 tablet by mouth daily with breakfast.     [provider]  potassium chloride SA (KLOR-CON M) 20 MEQ tablet Take 2 tablets (40 mEq total) by mouth 2 (two) times daily. 03/26/22   [place]ford, Maricela Bo, FNP  prednisoLONE acetate (PRED FORTE) 1 % ophthalmic suspension Place 1 drop into the right eye daily. 09/09/21   [provider]  simvastatin (ZOCOR) 20 MG tablet TAKE 1 TABLET AT BEDTIME 10/07/19   Imogene Burn, PA-C  spironolactone  (ALDACTONE) 25 MG tablet Take 1 tablet (25 mg total) by mouth daily. 11/06/21   Milford, Maricela Bo, FNP  Tdap Durwin Reges) 5-2.5-18.5 LF-MCG/0.5 injection Inject into the muscle. 05/09/22   Carlyle Basques, MD  torsemide (DEMADEX) 20 MG tablet Take 3 tablets (60 mg total) by mouth in the morning AND 2 tablets (40 mg total) every evening. 04/15/22   [place], Maricela Bo, FNP  vitamin B-12 (CYANOCOBALAMIN) 500 MCG tablet Take 500 mcg by mouth daily.    [provider]  XARELTO 20 MG TABS tablet TAKE 1 TABLET DAILY WITH SUPPER 06/26/21   Jettie Booze,  MD    Allergies Trandolapril-verapamil hcl er   REVIEW OF SYSTEMS  Negative except as noted here or in the History of Present Illness.   PHYSICAL EXAMINATION  Initial Vital Signs Blood pressure (!) 149/86, pulse 84, temperature 98.3 F (36.8 C), resp. rate 20, height _0  (1.575 m), weight 107.5 kg, SpO2 98 %.  Examination General: Well-developed, well-nourished female in no acute distress; appearance consistent with age of record HENT: normocephalic; atraumatic Eyes: Normal appearance Neck: supple Heart: Regular rhythm Lungs: Decreased air movement bilaterally with faint wheezing Abdomen: soft; nondistended; nontender; bowel sounds present Extremities: No deformity; full range of motion Neurologic: Awake, alert and oriented; motor function intact in all extremities and symmetric; no facial droop Skin: Warm and dry Psychiatric: Normal mood and affect   RESULTS  Summary of this visit's results, reviewed and interpreted by myself:   EKG Interpretation  Date/Time:  Thursday May 22 2022 21:08:03 EST Ventricular Rate:  90 PR Interval:    QRS Duration: 66 QT Interval:  346 QTC Calculation: 423 R Axis:   90 Text Interpretation: Atrial fibrillation Rightward axis Low voltage QRS Abnormal ECG When compared with ECG of 11-Feb-2022 11:48, No significant change was found Confirmed by Shanon Rosser [id number]) on 05/23/2022  3:27:41 AM       Laboratory Studies: Results for orders placed or performed during the hospital encounter of 05/23/22 (from the past 24 hour(s))  Resp panel by RT-PCR (RSV, Flu A&B, Covid) Anterior Nasal Swab     Status: None   Collection Time: 05/22/22  9:11 PM   Specimen: Anterior Nasal Swab  Result Value Ref Range   SARS Coronavirus 2 by RT PCR NEGATIVE NEGATIVE   Influenza A by PCR NEGATIVE NEGATIVE   Influenza B by PCR NEGATIVE NEGATIVE   Resp Syncytial Virus by PCR NEGATIVE NEGATIVE   Imaging Studies: DG Chest 2 View  Result Date: 05/22/2022 CLINICAL DATA:  Cough and shortness of breath. Rib pain when coughing EXAM: CHEST - 2 VIEW COMPARISON:  08/05/2021 FINDINGS: Cardiomegaly. Pulmonary vascular congestion. Hazy interstitial and airspace opacities bilaterally. Focal airspace opacity in the right lower lung. No pleural effusion or pneumothorax. No acute osseous abnormality. IMPRESSION: Focal opacity in the right lower lung suspicious for pneumonia. Follow-up in 4-6 weeks is recommended to ensure resolution. Cardiomegaly and signs of pulmonary edema. Electronically Signed   By: Placido Sou M.D.   On: 05/22/2022 21:41    ED COURSE and MDM  Nursing notes, initial and subsequent vitals signs, including pulse oximetry, reviewed and interpreted by myself.  Vitals:   05/22/22 2101 05/22/22 2105 05/23/22 0200  BP: 131/87  (!) 149/86  Pulse: 97  84  Resp: 20  20  Temp: 98.3 F (36.8 C)    SpO2: 99%  98%  Weight:  107.5 kg   Height:  _1  (1.575 m)    Medications  albuterol (VENTOLIN HFA) 108 (90 Base) MCG/ACT inhaler 2 puff (has no administration in time range)  aerochamber plus with mask device 1 each (has no administration in time range)  amoxicillin-clavulanate (AUGMENTIN) 875-125 MG per tablet 1 tablet (has no administration in time range)  doxycycline (VIBRA-TABS) tablet 100 mg (has no administration in time range)  chlorpheniramine-HYDROcodone (TUSSIONEX) 10-8 MG/5ML  suspension 5 mL (has no administration in time range)   We will refill the patient's albuterol inhaler and provided with an AeroChamber.  She has chest x-ray findings suspicious for pneumonia so we will treat her with Augmentin and doxycycline as she  has comorbidities that would make monotherapy potentially less effective.    PROCEDURES  Procedures   ED DIAGNOSES     ICD-10-CM   1. Community acquired pneumonia of right lower lobe of lung  J18.9     2. Acute bronchitis with bronchospasm  J20.9          Elberta Lachapelle, MD 05/23/22 [phone]

## 2022-05-23 NOTE — ED Notes (Signed)
RT educated pt on proper use of MDI w/spacer. Pt able to perform w/out difficulty. Pt respiratory status stable on RA. Pt verbalizes understanding of teaching.

## 2022-05-27 ENCOUNTER — Telehealth: Payer: Self-pay | Admitting: *Deleted

## 2022-05-27 NOTE — Telephone Encounter (Signed)
Patient with diagnosis of afib on Xarelto for anticoagulation.    Procedure: AHMED TUBE SHUNT LEFT EYE  Date of procedure: 06/12/22  CHA2DS2-VASc Score = 4  This indicates a 4.8% annual risk of stroke. The patient's score is based upon: CHF History: 1 HTN History: 1 Diabetes History: 0 Stroke History: 0 Vascular Disease History: 0 Age Score: 1 Gender Score: 1   CrCl 60mL/min using adjusted body weight due to obesity Platelet count 154K  Per office protocol, patient can hold Xarelto for 2 days prior to procedure.    **This guidance is not considered finalized until pre-operative APP has relayed final recommendations.**

## 2022-05-27 NOTE — Telephone Encounter (Signed)
   Pre-operative Risk Assessment    Patient Name: [name]  DOB: 20-Aug-1950 MRN: [id number]      Request for Surgical Clearance    Procedure:   AHMED TUBE SHUNT LEFT EYE  Date of Surgery:  Clearance 06/12/22                                 Surgeon:  DR. Katy Fitch Surgeon's Group or Practice Name:  [facility] Phone number:  [phone] Fax number:  [phone]   Type of Clearance Requested:   - Medical  - Pharmacy:  Hold Rivaroxaban (Xarelto)     Type of Anesthesia:  MAC   Additional requests/questions:    Jiles Prows   05/27/2022, 4:04 PM

## 2022-05-27 NOTE — Telephone Encounter (Signed)
Left message to call back to schedule a tele visit for pre op clearance.

## 2022-05-27 NOTE — Telephone Encounter (Signed)
Will send to pharmD for xarelto hold (Afib)  Pt recently in ED with CAP. Will need a tele visit once xarelto hold is determined.

## 2022-05-27 NOTE — Telephone Encounter (Signed)
   Name: [name]  DOB: 07/31/1950  MRN: [id number]  Primary Cardiologist: Larae Grooms, MD   Preoperative team, please contact this patient and set up a phone call appointment for further preoperative risk assessment. Please obtain consent and complete medication review. Thank you for your help.  I confirm that guidance regarding antiplatelet and oral anticoagulation therapy has been completed and, if necessary, noted below.  Per pharmD: Per office protocol, patient can hold Xarelto for 2 days prior to procedure.     Ledora Bottcher, PA 05/27/2022, 5:01 PM [place]

## 2022-05-29 ENCOUNTER — Telehealth: Payer: Self-pay | Admitting: *Deleted

## 2022-05-29 NOTE — Telephone Encounter (Signed)
Pt has scheduled for tele visit for pre op appt 06/03/22 @ 10 am . Med rec and consent are done.     Patient Consent for Virtual Visit        DALEXA [name] has provided verbal consent on 05/29/2022 for a virtual visit (video or telephone).   CONSENT FOR VIRTUAL VISIT FOR:  [name]  By participating in this virtual visit I agree to the following:  I hereby voluntarily request, consent and authorize [facility] and its employed or contracted physicians, physician assistants, nurse practitioners or other licensed health care professionals (the Practitioner), to provide me with telemedicine health care services (the "Services") as deemed necessary by the treating Practitioner. I acknowledge and consent to receive the Services by the Practitioner via telemedicine. I understand that the telemedicine visit will involve communicating with the Practitioner through live audiovisual communication technology and the disclosure of certain medical information by electronic transmission. I acknowledge that I have been given the opportunity to request an in-person assessment or other available alternative prior to the telemedicine visit and am voluntarily participating in the telemedicine visit.  I understand that I have the right to withhold or withdraw my consent to the use of telemedicine in the course of my care at any time, without affecting my right to future care or treatment, and that the Practitioner or I may terminate the telemedicine visit at any time. I understand that I have the right to inspect all information obtained and/or recorded in the course of the telemedicine visit and may receive copies of available information for a reasonable fee.  I understand that some of the potential risks of receiving the Services via telemedicine include:  Delay or interruption in medical evaluation due to technological equipment failure or disruption; Information transmitted may not be sufficient  (e.g. poor resolution of images) to allow for appropriate medical decision making by the Practitioner; and/or  In rare instances, security protocols could fail, causing a breach of personal health information.  Furthermore, I acknowledge that it is my responsibility to provide information about my medical history, conditions and care that is complete and accurate to the best of my ability. I acknowledge that Practitioner's advice, recommendations, and/or decision may be based on factors not within their control, such as incomplete or inaccurate data provided by me or distortions of diagnostic images or specimens that may result from electronic transmissions. I understand that the practice of medicine is not an exact science and that Practitioner makes no warranties or guarantees regarding treatment outcomes. I acknowledge that a copy of this consent can be made available to me via my patient portal ([facility]), or I can request a printed copy by calling the office of [facility].    I understand that my insurance will be billed for this visit.   I have read or had this consent read to me. I understand the contents of this consent, which adequately explains the benefits and risks of the Services being provided via telemedicine.  I have been provided ample opportunity to ask questions regarding this consent and the Services and have had my questions answered to my satisfaction. I give my informed consent for the services to be provided through the use of telemedicine in my medical care

## 2022-05-29 NOTE — Telephone Encounter (Signed)
Pt has scheduled for tele visit for pre op appt 06/03/22 @ 10 am . Med rec and consent are done.

## 2022-06-03 ENCOUNTER — Ambulatory Visit: Payer: Medicare Other | Attending: Internal Medicine | Admitting: Physician Assistant

## 2022-06-03 DIAGNOSIS — Z01810 Encounter for preprocedural cardiovascular examination: Secondary | ICD-10-CM | POA: Diagnosis not present

## 2022-06-03 NOTE — Progress Notes (Signed)
Virtual Visit via Telephone Note   Because of [name]'s co-morbid illnesses, she is at least at moderate risk for complications without adequate follow up.  This format is felt to be most appropriate for this patient at this time.  The patient did not have access to video technology/had technical difficulties with video requiring transitioning to audio format only (telephone).  All issues noted in this document were discussed and addressed.  No physical exam could be performed with this format.  Please refer to the patient's chart for her consent to telehealth for [facility].  Evaluation Performed:  Preoperative cardiovascular risk assessment _____________   Date:  06/03/2022   Patient ID:  [name], DOB 1950/09/05, MRN [id number] Patient Location:  Home Provider location:   Office  Primary Care Provider:  Stephens Shire, MD Primary Cardiologist:  Larae Grooms, MD  Chief Complaint / Patient Profile   72 y.o. y/o female with a h/o chronic diastolic heart failure, RV dysfunction, pulmonary hypertension, persistent atrial fibrillation, MR/TR, hypertension, hyperlipidemia, history of right-sided breast cancer in 2013 treatment with mastectomy, chemo plus radiation, CKD stage IIIb and peripheral neuropathy who is pending left eye surgery and presents today for telephonic preoperative cardiovascular risk assessment.  History of Present Illness    [name] is a 72 y.o. female who presents via audio/video conferencing for a telehealth visit today.  Pt was last seen in cardiology clinic on 03/26/2022 by Allena Katz, NP.  At that time [name] was doing well.  The patient is now pending procedure as outlined above. Since her last visit, she tells me that she did have pneumonia 05/23/2022 and underwent a full round of antibiotics.  She is now asymptomatic and feeling much better.  She states she has not had any shortness of breath, chest pain, or  palpitations.  Things have been pretty stable for her.  She sees the heart failure clinic every couple of months and they are titrating her medications accordingly.  She is scored a 5.99 METS on the DASI which exceeds the 4 METS minimum requirement.   Per office protocol, patient can hold Xarelto for 2 days prior to procedure.   Past Medical History    Past Medical History:  Diagnosis Date   Arthritis    Asthma    Blood transfusion    hx of last one in 1987   Breast cancer [facility]) 2013   Right Breast Cancer   Cancer of lower-inner quadrant of female breast ([place]) 07/24/2011   right breast cancer /IDC,stage IIB,er/pr=+,her2=Neg   Glaucoma    Heart murmur    History of chemotherapy    taxotere/cytoxan 6 cycles 09/22/11-01/05/12 day 2 neulasta   Hyperlipemia    Hypertension    Lymphedema    right arm wears a sleeve   MRSA (methicillin resistant staph aureus) culture positive    08/2011   Neuromuscular disorder ([place])    peripheral neuropathy hands feet   OSA (obstructive sleep apnea)    CPAP settings- ?    Persistent atrial fibrillation (HCC)    Personal history of chemotherapy    Personal history of radiation therapy    Pituitary abnormality ([place]) 05/25/2013   small pituitary growth-appears stable- Dr. Christella Noa follows   Past Surgical History:  Procedure Laterality Date   ABDOMINAL HYSTERECTOMY  2007   TAH/BSO   BREAST SURGERY     mastectomy RIGHT   BUBBLE STUDY  09/20/2021   Procedure: BUBBLE STUDY;  Surgeon:  Larey Dresser, MD;  Location: [facility] ENDOSCOPY;  Service: Cardiovascular;;   CARDIOVERSION N/A 06/02/2017   Procedure: CARDIOVERSION;  Surgeon: Dorothy Spark, MD;  Location: [place];  Service: Cardiovascular;  Laterality: N/A;   CARPAL TUNNEL RELEASE Right    2012   CHOLECYSTECTOMY  1987   open - Dr Lindon Romp   COLONOSCOPY N/A 06/13/2013   Procedure: COLONOSCOPY;  Surgeon: Juanita Craver, MD;  Location: [facility] ENDOSCOPY;  Service: Endoscopy;  Laterality: N/A;   DILATION AND  CURETTAGE OF UTERUS     x3   EYE SURGERY     laser eye surgery for glaucoma   MASTECTOMY MODIFIED RADICAL Right 08/14/11   right , ER/PR +, HER2 -   port-a-cath insertion     port-a-cath removal 1'14   PORT-A-CATH REMOVAL  06/22/2012   Procedure: MINOR REMOVAL PORT-A-CATH;  Surgeon: Haywood Lasso, MD;  Location: [facility];  Service: General;  Laterality: Left;   PORTACATH PLACEMENT  09/11/2011   Procedure: INSERTION PORT-A-CATH;  Surgeon: Joyice Faster. Cornett, MD;  Location: [facility] ORS;  Service: General;  Laterality: N/A;  Insert of Port   RIGHT HEART CATH N/A 08/28/2021   Procedure: RIGHT HEART CATH;  Surgeon: Larey Dresser, MD;  Location: [facility] CV LAB;  Service: Cardiovascular;  Laterality: N/A;   TEE WITHOUT CARDIOVERSION N/A 09/20/2021   Procedure: TRANSESOPHAGEAL ECHOCARDIOGRAM (TEE);  Surgeon: Larey Dresser, MD;  Location[facility] ENDOSCOPY;  Service: Cardiovascular;  Laterality: N/A;   TUBAL LIGATION      Allergies  Allergies  Allergen Reactions   Trandolapril-Verapamil Hcl Er Swelling    Causes lips to swell [name]" is the name brand) Tolerates amlodipine    Home Medications    Prior to Admission medications   Medication Sig Start Date End Date Taking? Authorizing Provider  acetaminophen (TYLENOL) 325 MG tablet Take 325-650 mg by mouth every 6 (six) hours as needed (for pain.).    [provider]  albuterol (PROAIR HFA) 108 (90 Base) MCG/ACT inhaler Inhale 1-2 puffs into the lungs every 6 (six) hours as needed for wheezing or shortness of breath. For shortness of breath 03/28/20   Parrett, Fonnie Mu, NP  amoxicillin-clavulanate (AUGMENTIN) 875-125 MG tablet Take 1 tablet by mouth every 12 (twelve) hours. 05/23/22   Molpus, John, MD  azaTHIOprine (IMURAN) 50 MG tablet Take 100 mg by mouth daily.    [provider]  cabergoline (DOSTINEX) 0.5 MG tablet Take 0.5 mg by mouth 2 (two) times a week. Tuesday and Thursday    [provider]   chlorpheniramine-HYDROcodone (TUSSIONEX) 10-8 MG/5ML Take 5 mLs by mouth every 12 (twelve) hours as needed for cough. 05/23/22   Molpus, John, MD  Cholecalciferol (VITAMIN D3) 1000 UNITS CAPS Take 1,000 Units by mouth every other day.     [provider]  dorzolamide (TRUSOPT) 2 % ophthalmic solution Place 1 drop into both eyes 2 (two) times daily. 09/09/21   [provider]  doxycycline (VIBRAMYCIN) 100 MG capsule Take 1 capsule (100 mg total) by mouth 2 (two) times daily. One po bid x 7 days 05/23/22   Molpus, John, MD  empagliflozin (JARDIANCE) 10 MG TABS tablet Take 1 tablet (10 mg total) by mouth daily. 08/26/21   Jettie Booze, MD  GLUCOSAMINE HCL PO Take 1 tablet by mouth daily.    [provider]  latanoprost (XALATAN) 0.005 % ophthalmic solution Place 1 drop into both eyes at bedtime. 09/04/21   [provider]  metoprolol succinate (TOPROL-XL) 50 MG 24 hr tablet TAKE ONE AND ONE-HALF TABLETS TWICE A DAY 01/15/21   Jettie Booze, MD  Multiple Vitamin (MULITIVITAMIN WITH MINERALS) TABS Take 1 tablet by mouth daily with breakfast.     [provider]  potassium chloride SA (KLOR-CON M) 20 MEQ tablet Take 2 tablets (40 mEq total) by mouth 2 (two) times daily. 03/26/22   Milford, Maricela Bo, FNP  prednisoLONE acetate (PRED FORTE) 1 % ophthalmic suspension Place 1 drop into the right eye daily. Patient not taking: Reported on 05/29/2022 09/09/21   [provider]  simvastatin (ZOCOR) 20 MG tablet TAKE 1 TABLET AT BEDTIME 10/07/19   Imogene Burn, PA-C  spironolactone (ALDACTONE) 25 MG tablet Take 1 tablet (25 mg total) by mouth daily. 11/06/21   Milford, Maricela Bo, FNP  Tdap Durwin Reges) 5-2.5-18.5 LF-MCG/0.5 injection Inject into the muscle. 05/09/22   Carlyle Basques, MD  torsemide (DEMADEX) 20 MG tablet Take 3 tablets (60 mg total) by mouth in the morning AND 2 tablets (40 mg total) every evening. 04/15/22   Milford, Maricela Bo, FNP   vitamin B-12 (CYANOCOBALAMIN) 500 MCG tablet Take 500 mcg by mouth daily.    [provider]  XARELTO 20 MG TABS tablet TAKE 1 TABLET DAILY WITH SUPPER 06/26/21   Jettie Booze, MD    Physical Exam    Vital Signs:  [name] does not have vital signs available for review today.  Given telephonic nature of communication, physical exam is limited. AAOx3. NAD. Normal affect.  Speech and respirations are unlabored.  Accessory Clinical Findings    None  Assessment & Plan    1.  Preoperative Cardiovascular Risk Assessment:  Ms. Spindle perioperative risk of a major cardiac event is 0.9% according to the Revised Cardiac Risk Index (RCRI).  Therefore, she is at low risk for perioperative complications.   Her functional capacity is good at 5.99 METs according to the [place] Activity Status Index (DASI). Recommendations: According to ACC/AHA guidelines, no further cardiovascular testing needed.  The patient may proceed to surgery at acceptable risk.   Antiplatelet and/or Anticoagulation Recommendations:  Xarelto (Rivaroxaban) can be held for 2 days prior to surgery.  Please resume post op when felt to be safe.    The patient was advised that if she develops new symptoms prior to surgery to contact our office to arrange for a follow-up visit, and she verbalized understanding.  A copy of this note will be routed to requesting surgeon.  Time:   Today, I have spent 10 minutes with the patient with telehealth technology discussing medical history, symptoms, and management plan.     Elgie Collard, PA-C  06/03/2022, 10:07 AM

## 2022-06-23 ENCOUNTER — Other Ambulatory Visit: Payer: Self-pay | Admitting: Interventional Cardiology

## 2022-06-23 DIAGNOSIS — I482 Chronic atrial fibrillation, unspecified: Secondary | ICD-10-CM

## 2022-06-23 NOTE — Telephone Encounter (Signed)
Prescription refill request for Xarelto received.  Indication:afib Last office visit:1/24 Weight:107.5 kg Age:72 Scr:1.6 CrCl:53.14  ml/min  Prescription refilled

## 2022-07-07 ENCOUNTER — Other Ambulatory Visit: Payer: Self-pay | Admitting: Interventional Cardiology

## 2022-07-25 ENCOUNTER — Other Ambulatory Visit: Payer: Self-pay | Admitting: Interventional Cardiology

## 2022-08-20 NOTE — Progress Notes (Unsigned)
Office Visit    Patient Name: [name] Date of Encounter: 08/21/2022  PCP:  Stephens Shire, MD   [place]  Cardiologist:  Larae Grooms, MD  Advanced Practice Provider:  No care team member to display Electrophysiologist:  Thompson Grayer, MD (Inactive)    HPI    [name] is a 72 y.o. 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  She was evaluated by cardiology at that time and diuresed with IV Lasix.  Was noted to be in rate controlled atrial fibrillation, persistent.  Echo 3/23 showed hyperdynamic LVEF 65 to 70%, D-shaped LV consistent with RV volume overload, grade 2 DD with moderate LVH, mildly reduced RV and severely elevated RVSP, 58 mmHg, moderate MR with severe BAE and severe TR.  After diuresis we will switch back to oral Lasix plus Jardiance.  Continue on Xarelto and metoprolol for atrial fibrillation.  Referred to [facility] clinic.  Weight 279 at this point.  Full compliance with CPAP.  Full compliance with Xarelto.  Denied history of PE, DVT.  No family history of HF.  Denied chest pain.  Non-smoker.  Positive history of asthma but with no history of COPD.  No known history of connective tissue disorder.  RHC was arranged from [facility] clinic done 4/23 which showed markedly elevated right and left heart filling pressures with predominant V waves in the PCWP tracing.  PVR not elevated, pulmonary venous hypertension.  Lasix was increased.  She was seen postcardiac catheterization.  Lasix was stopped.  Switch to torsemide 60 mg twice daily.  Amyloid workup was arranged.  Multiple myeloma negative for M spike protein.  Immunofixation  unremarkable.  She was also set up for TEE to better assess her mitral valve.  It showed moderate MR with restriction of the posterior leaflet.  Also with moderate to severe TR, normal LVEF 60 to 65%.  Follow-up 5/23 with mild volume overload.  Spyro 12.5 mg started.  Cardiac MRI 7/23 however was not suggestive of cardiac amyloidosis.  Return to see heart failure 02/11/2022 was using CPAP.  Shortness of breath when walking 100 feet.  Uses a cane.  No chest pain.  No lightheadedness.  Does not feel tingling or numbness in her feet.  Weight down 2 pounds.  Today, she tells me she was in the ED with pneumonia back in December.  She has some shortness of breath and cough.  She was on antibiotics.  She feels a lot better now.  She states that her shortness of breath has cleared up some.  She had surgery on her eyes back in January and has really been taking it easy since then.  Her weight went up because she was not taking her diuretics.  Her dry weight is 244.  She states she is back on her fluid pills and started back on them this week.  She is encouraged to take her weight daily.  Reports no chest pain, pressure, or tightness. No edema, orthopnea, PND. Reports no palpitations.   Past Medical History    Past Medical History:  Diagnosis Date   Arthritis    Asthma    Blood transfusion    hx of last one in 1987   Breast cancer ([place])  2013   Right Breast Cancer   Cancer of lower-inner quadrant of female breast ([place]) 07/24/2011   right breast cancer /IDC,stage IIB,er/pr=+,her2=Neg   Glaucoma    Heart murmur    History of chemotherapy    taxotere/cytoxan 6 cycles 09/22/11-01/05/12 day 2 neulasta   Hyperlipemia    Hypertension    Lymphedema    right arm wears a sleeve   MRSA (methicillin resistant staph aureus) culture positive    08/2011   Neuromuscular disorder ([place])    peripheral neuropathy hands feet   OSA (obstructive sleep apnea)    CPAP settings- ?    Persistent atrial fibrillation ([facility])     Personal history of chemotherapy    Personal history of radiation therapy    Pituitary abnormality ([place]) 05/25/2013   small pituitary growth-appears stable- Dr. Christella Noa follows   Past Surgical History:  Procedure Laterality Date   ABDOMINAL HYSTERECTOMY  2007   TAH/BSO   BREAST SURGERY     mastectomy RIGHT   BUBBLE STUDY  09/20/2021   Procedure: BUBBLE STUDY;  Surgeon: Larey Dresser, MD;  Location: [place];  Service: Cardiovascular;;   CARDIOVERSION N/A 06/02/2017   Procedure: CARDIOVERSION;  Surgeon: Dorothy Spark, MD;  Location: [place];  Service: Cardiovascular;  Laterality: N/A;   CARPAL TUNNEL RELEASE Right    2012   CHOLECYSTECTOMY  1987   open - Dr Lindon Romp   COLONOSCOPY N/A 06/13/2013   Procedure: COLONOSCOPY;  Surgeon: Juanita Craver, MD;  Location: [facility];  Service: Endoscopy;  Laterality: N/A;   DILATION AND CURETTAGE OF UTERUS     x3   EYE SURGERY     laser eye surgery for glaucoma   MASTECTOMY MODIFIED RADICAL Right 08/14/11   right , ER/PR +, HER2 -   port-a-cath insertion     port-a-cath removal 1'14   PORT-A-CATH REMOVAL  06/22/2012   Procedure: MINOR REMOVAL PORT-A-CATH;  Surgeon: Haywood Lasso, MD;  Location: [place];  Service: General;  Laterality: Left;   PORTACATH PLACEMENT  09/11/2011   Procedure: INSERTION PORT-A-CATH;  Surgeon: Joyice Faster. Cornett, MD;  Location: [facility];  Service: General;  Laterality: N/A;  Insert of Port   RIGHT HEART CATH N/A 08/28/2021   Procedure: RIGHT HEART CATH;  Surgeon: Larey Dresser, MD;  Location: [place] [facility];  Service: Cardiovascular;  Laterality: N/A;   TEE WITHOUT CARDIOVERSION N/A 09/20/2021   Procedure: TRANSESOPHAGEAL ECHOCARDIOGRAM (TEE);  Surgeon: Larey Dresser, MD;  Location: [facility];  Service: Cardiovascular;  Laterality: N/A;   TUBAL LIGATION      Allergies  Allergies  Allergen Reactions   Trandolapril-Verapamil Hcl Er Swelling    Causes lips to swell [name]"  is the name brand) Tolerates amlodipine    EKGs/Labs/Other Studies Reviewed:   The following studies were reviewed today:  CARDIAC MRI 11/2021 IMPRESSION: 1. Normal LV size and function LVEF 59%   2.  No delayed enhancement or evidence of amyloid   3. Moderate RVE/dysfunction RVEF 29% with dilated main PA 44 mm consistent with significant RV failure   4.  Severe bi atrial enlargement   5.  Mild appearing MR   6.  Mild appearing AR   7.  Dilated ascending thoracic aorta 4.0 cm  EKG:  EKG is not ordered today.   Recent Labs: 03/24/2022: ALT 10; Hemoglobin 14.0; Platelet Count 154 04/14/2022: B Natriuretic Peptide 212.8 04/24/2022: BUN 36; Creatinine, Ser 1.60; Potassium 3.9; Sodium 138  Recent Lipid Panel  Component Value Date/Time   CHOL 182 10/15/2018 1047   TRIG 66 10/15/2018 1047   HDL 64 10/15/2018 1047   CHOLHDL 2.8 10/15/2018 1047   LDLCALC 105 (H) 10/15/2018 1047    Risk Assessment/Calculations:   CHA2DS2-VASc Score = 4   This indicates a 4.8% annual risk of stroke. The patient's score is based upon: CHF History: 1 HTN History: 1 Diabetes History: 0 Stroke History: 0 Vascular Disease History: 0 Age Score: 1 Gender Score: 1     Home Medications   Current Meds  Medication Sig   acetaminophen (TYLENOL) 325 MG tablet Take 325-650 mg by mouth every 6 (six) hours as needed (for pain.).   acetaZOLAMIDE (DIAMOX) 250 MG tablet Take 250 mg by mouth 2 (two) times daily. Pt takes 2 tablets, 500 mg Bid.   albuterol (PROAIR HFA) 108 (90 Base) MCG/ACT inhaler Inhale 1-2 puffs into the lungs every 6 (six) hours as needed for wheezing or shortness of breath. For shortness of breath   azaTHIOprine (IMURAN) 50 MG tablet Take 100 mg by mouth daily.   cabergoline (DOSTINEX) 0.5 MG tablet Take 0.5 mg by mouth 2 (two) times a week. Tuesday and Thursday   chlorpheniramine-HYDROcodone (TUSSIONEX) 10-8 MG/5ML Take 5 mLs by mouth every 12 (twelve) hours as needed for  cough.   Cholecalciferol (VITAMIN D3) 1000 UNITS CAPS Take 1,000 Units by mouth every other day.    dorzolamide (TRUSOPT) 2 % ophthalmic solution Place 1 drop into both eyes 2 (two) times daily.   GLUCOSAMINE HCL PO Take 1 tablet by mouth daily.   latanoprost (XALATAN) 0.005 % ophthalmic solution Place 1 drop into both eyes at bedtime.   Multiple Vitamin (MULITIVITAMIN WITH MINERALS) TABS Take 1 tablet by mouth daily with breakfast.    potassium chloride SA (KLOR-CON M) 20 MEQ tablet Take 2 tablets (40 mEq total) by mouth 2 (two) times daily.   prednisoLONE acetate (PRED FORTE) 1 % ophthalmic suspension Place 1 drop into the right eye daily.   simvastatin (ZOCOR) 20 MG tablet TAKE 1 TABLET AT BEDTIME   spironolactone (ALDACTONE) 25 MG tablet Take 1 tablet (25 mg total) by mouth daily.   Tdap (BOOSTRIX) 5-2.5-18.5 LF-MCG/0.5 injection Inject into the muscle.   torsemide (DEMADEX) 20 MG tablet Take 3 tablets (60 mg total) by mouth in the morning AND 2 tablets (40 mg total) every evening.   vitamin B-12 (CYANOCOBALAMIN) 500 MCG tablet Take 500 mcg by mouth daily.   XARELTO 20 MG TABS tablet TAKE 1 TABLET DAILY WITH SUPPER   [DISCONTINUED] empagliflozin (JARDIANCE) 10 MG TABS tablet Take 1 tablet (10 mg total) by mouth daily.   [DISCONTINUED] metoprolol succinate (TOPROL-XL) 50 MG 24 hr tablet TAKE AS INSTRUCTED BY YOUR PRESCRIBER (Patient taking differently: Take 75 mg by mouth 2 (two) times daily. Pt takes 1&1/2 tablet bid.)     Review of Systems      All other systems reviewed and are otherwise negative except as noted above.  Physical Exam    VS:  BP 102/62   Pulse 87   Ht 5\' 2"  (1.575 m)   Wt 257 lb 3.2 oz (116.7 kg)   SpO2 97%   BMI 47.04 kg/m  , BMI Body mass index is 47.04 kg/m.  Wt Readings from Last 3 Encounters:  08/21/22 257 lb 3.2 oz (116.7 kg)  05/22/22 237 lb (107.5 kg)  03/26/22 251 lb 6.4 oz (114 kg)     GEN: Well nourished, well developed, in no acute  distress. HEENT: normal. Neck: Supple, no JVD, carotid bruits, or masses. Cardiac: Irregularly irregular, no murmurs, rubs, or gallops. No clubbing, cyanosis, 2-3+ edema.  Radials/PT 2+ and equal bilaterally.  Respiratory:  Respirations regular and unlabored, clear to auscultation bilaterally. GI: Soft, nontender, nondistended. MS: No deformity or atrophy. Skin: Warm and dry, no rash. Neuro:  Strength and sensation are intact. Psych: Normal affect.  Assessment & Plan    Chronic diastolic heart failure -she has scheduled follow-up with heart failure -Continue current medications which include Jardiance 10 daily, Toprol succinate 75 mg daily, potassium replacement, Xarelto 20 mg daily, simvastatin 20 mg daily, spironolactone 25 mg daily, Demadex 60 mg in the morning and 40 in the evening -She is 10 pounds up from her dry weight, just started back on her diuretics this week -Would continue to encourage daily weights and would expect it to continue to decrease with initiation of diuretics -Continue to increase activity as tolerated (she was more sedentary when she was recovering from surgery)  Pulmonary hypertension -Continue diuresis  Persistent atrial fibrillation -She is in atrial fibrillation now.  Severe biatrial enlargement, AF present for years.  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  Signed, Elgie Collard, PA-C 08/21/2022, 12:31 PM [facility]

## 2022-08-21 ENCOUNTER — Encounter: Payer: Self-pay | Admitting: Physician Assistant

## 2022-08-21 ENCOUNTER — Ambulatory Visit: Payer: Medicare Other | Attending: Physician Assistant | Admitting: Physician Assistant

## 2022-08-21 VITALS — BP 102/62 | HR 87 | Ht 62.0 in | Wt 257.2 lb

## 2022-08-21 DIAGNOSIS — N183 Chronic kidney disease, stage 3 unspecified: Secondary | ICD-10-CM | POA: Diagnosis not present

## 2022-08-21 DIAGNOSIS — I5032 Chronic diastolic (congestive) heart failure: Secondary | ICD-10-CM | POA: Diagnosis not present

## 2022-08-21 DIAGNOSIS — I10 Essential (primary) hypertension: Secondary | ICD-10-CM | POA: Diagnosis present

## 2022-08-21 DIAGNOSIS — I071 Rheumatic tricuspid insufficiency: Secondary | ICD-10-CM | POA: Diagnosis present

## 2022-08-21 DIAGNOSIS — I482 Chronic atrial fibrillation, unspecified: Secondary | ICD-10-CM | POA: Diagnosis not present

## 2022-08-21 DIAGNOSIS — I340 Nonrheumatic mitral (valve) insufficiency: Secondary | ICD-10-CM | POA: Diagnosis present

## 2022-08-21 DIAGNOSIS — E785 Hyperlipidemia, unspecified: Secondary | ICD-10-CM | POA: Insufficient documentation

## 2022-08-21 DIAGNOSIS — I272 Pulmonary hypertension, unspecified: Secondary | ICD-10-CM | POA: Diagnosis not present

## 2022-08-21 MED ORDER — METOPROLOL SUCCINATE ER 50 MG PO TB24: 75.0000 mg | ORAL_TABLET | Freq: Two times a day (BID) | ORAL | 3 refills | Status: DC

## 2022-08-21 MED ORDER — EMPAGLIFLOZIN 10 MG PO TABS: 10.0000 mg | ORAL_TABLET | Freq: Every day | ORAL | 3 refills | Status: DC

## 2022-08-21 NOTE — Patient Instructions (Addendum)
Medication Instructions:  Your physician recommends that you continue on your current medications as directed. Please refer to the Current Medication list given to you today.  *If you need a refill on your cardiac medications before your next appointment, please call your pharmacy*  Lab Work: None If you have labs (blood work) drawn today and your tests are completely normal, you will receive your results only by: [place] (if you have MyChart) OR A paper copy in the mail If you have any lab test that is abnormal or we need to change your treatment, we will call you to review the results.  Follow-Up: At [facility], you and your health needs are our priority.  As part of our continuing mission to provide you with exceptional heart care, we have created designated Provider Care Teams.  These Care Teams include your primary Cardiologist (physician) and Advanced Practice Providers (APPs -  Physician Assistants and Nurse Practitioners) who all work together to provide you with the care you need, when you need it.  We recommend signing up for the patient portal called "MyChart".  Sign up information is provided on this After Visit Summary.  MyChart is used to connect with patients for Virtual Visits (Telemedicine).  Patients are able to view lab/test results, encounter notes, upcoming appointments, etc.  Non-urgent messages can be sent to your provider as well.   To learn more about what you can do with MyChart, go to [email or web address].    Your next appointment:   6 month(s)  Provider:   Larae Grooms, MD  or APP  Schedule 3 month follow up with Dr Aundra Dubin  Other Instructions Weigh every morning after using the restroom, before breakfast and call and let us know if you have a weight gain of 2 or more lbs overnight or 5 or more lbs in 1 week  Low-Sodium Eating Plan Sodium, which is an element that makes up salt, helps you maintain a healthy balance of fluids in your  body. Too much sodium can increase your blood pressure and cause fluid and waste to be held in your body. Your health care provider or dietitian may recommend following this plan if you have high blood pressure (hypertension), kidney disease, liver disease, or heart failure. Eating less sodium can help lower your blood pressure, reduce swelling, and protect your heart, liver, and kidneys. What are tips for following this plan? Reading food labels The Nutrition Facts label lists the amount of sodium in one serving of the food. If you eat more than one serving, you must multiply the listed amount of sodium by the number of servings. Choose foods with less than 140 mg of sodium per serving. Avoid foods with 300 mg of sodium or more per serving. Shopping  Look for lower-sodium products, often labeled as "low-sodium" or "no salt added." Always check the sodium content, even if foods are labeled as "unsalted" or "no salt added." Buy fresh foods. Avoid canned foods and pre-made or frozen meals. Avoid canned, cured, or processed meats. Buy breads that have less than 80 mg of sodium per slice. Cooking  Eat more home-cooked food and less restaurant, buffet, and fast food. Avoid adding salt when cooking. Use salt-free seasonings or herbs instead of table salt or sea salt. Check with your health care provider or pharmacist before using salt substitutes. Cook with plant-based oils, such as canola, sunflower, or olive oil. Meal planning When eating at a restaurant, ask that your food be prepared with less salt  or no salt, if possible. Avoid dishes labeled as brined, pickled, cured, smoked, or made with soy sauce, miso, or teriyaki sauce. Avoid foods that contain MSG (monosodium glutamate). MSG is sometimes added to [place] food, bouillon, and some canned foods. Make meals that can be grilled, baked, poached, roasted, or steamed. These are generally made with less sodium. General information Most people on  this plan should limit their sodium intake to 1,500-2,000 mg (milligrams) of sodium each day. What foods should I eat? Fruits Fresh, frozen, or canned fruit. Fruit juice. Vegetables Fresh or frozen vegetables. "No salt added" canned vegetables. "No salt added" tomato sauce and paste. Low-sodium or reduced-sodium tomato and vegetable juice. Grains Low-sodium cereals, including oats, puffed wheat and rice, and shredded wheat. Low-sodium crackers. Unsalted rice. Unsalted pasta. Low-sodium bread. Whole-grain breads and whole-grain pasta. Meats and other proteins Fresh or frozen (no salt added) meat, poultry, seafood, and fish. Low-sodium canned tuna and salmon. Unsalted nuts. Dried peas, beans, and lentils without added salt. Unsalted canned beans. Eggs. Unsalted nut butters. Dairy Milk. Soy milk. Cheese that is naturally low in sodium, such as ricotta cheese, fresh mozzarella, or Swiss cheese. Low-sodium or reduced-sodium cheese. Cream cheese. Yogurt. Seasonings and condiments Fresh and dried herbs and spices. Salt-free seasonings. Low-sodium mustard and ketchup. Sodium-free salad dressing. Sodium-free light mayonnaise. Fresh or refrigerated horseradish. Lemon juice. Vinegar. Other foods Homemade, reduced-sodium, or low-sodium soups. Unsalted popcorn and pretzels. Low-salt or salt-free chips. The items listed above may not be a complete list of foods and beverages you can eat. Contact a dietitian for more information. What foods should I avoid? Vegetables Sauerkraut, pickled vegetables, and relishes. Olives. [place] fries. Onion rings. Regular canned vegetables (not low-sodium or reduced-sodium). Regular canned tomato sauce and paste (not low-sodium or reduced-sodium). Regular tomato and vegetable juice (not low-sodium or reduced-sodium). Frozen vegetables in sauces. Grains Instant hot cereals. Bread stuffing, pancake, and biscuit mixes. Croutons. Seasoned rice or pasta mixes. Noodle soup cups.  Boxed or frozen macaroni and cheese. Regular salted crackers. Self-rising flour. Meats and other proteins Meat or fish that is salted, canned, smoked, spiced, or pickled. Precooked or cured meat, such as sausages or meat loaves. [place] Salines. Ham. Pepperoni. Hot dogs. Corned beef. Chipped beef. Salt pork. Jerky. Pickled herring. Anchovies and sardines. Regular canned tuna. Salted nuts. Dairy Processed cheese and cheese spreads. Hard cheeses. Cheese curds. Blue cheese. Feta cheese. String cheese. Regular cottage cheese. Buttermilk. Canned milk. Fats and oils Salted butter. Regular margarine. Ghee. Bacon fat. Seasonings and condiments Onion salt, garlic salt, seasoned salt, table salt, and sea salt. Canned and packaged gravies. Worcestershire sauce. Tartar sauce. Barbecue sauce. Teriyaki sauce. Soy sauce, including reduced-sodium. Steak sauce. Fish sauce. Oyster sauce. Cocktail sauce. Horseradish that you find on the shelf. Regular ketchup and mustard. Meat flavorings and tenderizers. Bouillon cubes. Hot sauce. Pre-made or packaged marinades. Pre-made or packaged taco seasonings. Relishes. Regular salad dressings. Salsa. Other foods Salted popcorn and pretzels. Corn chips and puffs. Potato and tortilla chips. Canned or dried soups. Pizza. Frozen entrees and pot pies. The items listed above may not be a complete list of foods and beverages you should avoid. Contact a dietitian for more information. Summary Eating less sodium can help lower your blood pressure, reduce swelling, and protect your heart, liver, and kidneys. Most people on this plan should limit their sodium intake to 1,500-2,000 mg (milligrams) of sodium each day. Canned, boxed, and frozen foods are high in sodium. Restaurant foods, fast foods, and pizza are also  very high in sodium. You also get sodium by adding salt to food. Try to cook at home, eat more fresh fruits and vegetables, and eat less fast food and canned, processed, or prepared  foods. This information is not intended to replace advice given to you by your health care provider. Make sure you discuss any questions you have with your health care provider. Document Revised: 04/18/2019 Document Reviewed: 04/13/2019 Elsevier Patient Education  [place] Many factors influence your heart health, including eating and exercise habits. Heart health is also called coronary health. Coronary risk increases with abnormal blood fat (lipid) levels. A heart-healthy eating plan includes limiting unhealthy fats, increasing healthy fats, limiting salt (sodium) intake, and making other diet and lifestyle changes. What is my plan? Your health care provider may recommend that: You limit your fat intake to _________% or less of your total calories each day. You limit your saturated fat intake to _________% or less of your total calories each day. You limit the amount of cholesterol in your diet to less than _________ mg per day. You limit the amount of sodium in your diet to less than _________ mg per day. What are tips for following this plan? Cooking Cook foods using methods other than frying. Baking, boiling, grilling, and broiling are all good options. Other ways to reduce fat include: Removing the skin from poultry. Removing all visible fats from meats. Steaming vegetables in water or broth. Meal planning  At meals, imagine dividing your plate into fourths: Fill one-half of your plate with vegetables and green salads. Fill one-fourth of your plate with whole grains. Fill one-fourth of your plate with lean protein foods. Eat 2-4 cups of vegetables per day. One cup of vegetables equals 1 cup (91 g) broccoli or cauliflower florets, 2 medium carrots, 1 large bell pepper, 1 large sweet potato, 1 large tomato, 1 medium white potato, 2 cups (150 g) raw leafy greens. Eat 1-2 cups of fruit per day. One cup of fruit equals 1 small apple, 1 large banana,  1 cup (237 g) mixed fruit, 1 large orange,  cup (82 g) dried fruit, 1 cup (240 mL) 100% fruit juice. Eat more foods that contain soluble fiber. Examples include apples, broccoli, carrots, beans, peas, and barley. Aim to get 25-30 g of fiber per day. Increase your consumption of legumes, nuts, and seeds to 4-5 servings per week. One serving of dried beans or legumes equals  cup (90 g) cooked, 1 serving of nuts is  oz (12 almonds, 24 pistachios, or 7 walnut halves), and 1 serving of seeds equals  oz (8 g). Fats Choose healthy fats more often. Choose monounsaturated and polyunsaturated fats, such as olive and canola oils, avocado oil, flaxseeds, walnuts, almonds, and seeds. Eat more omega-3 fats. Choose salmon, mackerel, sardines, tuna, flaxseed oil, and ground flaxseeds. Aim to eat fish at least 2 times each week. Check food labels carefully to identify foods with trans fats or high amounts of saturated fat. Limit saturated fats. These are found in animal products, such as meats, butter, and cream. Plant sources of saturated fats include palm oil, palm kernel oil, and coconut oil. Avoid foods with partially hydrogenated oils in them. These contain trans fats. Examples are stick margarine, some tub margarines, cookies, crackers, and other baked goods. Avoid fried foods. General information Eat more home-cooked food and less restaurant, buffet, and fast food. Limit or avoid alcohol. Limit foods that are high in added sugar and simple  starches such as foods made using white refined flour (white breads, pastries, sweets). Lose weight if you are overweight. Losing just 5-10% of your body weight can help your overall health and prevent diseases such as diabetes and heart disease. Monitor your sodium intake, especially if you have high blood pressure. Talk with your health care provider about your sodium intake. Try to incorporate more vegetarian meals weekly. What foods should I eat? Fruits All fresh,  canned (in natural juice), or frozen fruits. Vegetables Fresh or frozen vegetables (raw, steamed, roasted, or grilled). Green salads. Grains Most grains. Choose whole wheat and whole grains most of the time. Rice and pasta, including brown rice and pastas made with whole wheat. Meats and other proteins Lean, well-trimmed beef, veal, pork, and lamb. Chicken and [place] without skin. All fish and shellfish. Wild duck, rabbit, pheasant, and venison. Egg whites or low-cholesterol egg substitutes. Dried beans, peas, lentils, and tofu. Seeds and most nuts. Dairy Low-fat or nonfat cheeses, including ricotta and mozzarella. Skim or 1% milk (liquid, powdered, or evaporated). Buttermilk made with low-fat milk. Nonfat or low-fat yogurt. Fats and oils Non-hydrogenated (trans-free) margarines. Vegetable oils, including soybean, sesame, sunflower, olive, avocado, peanut, safflower, corn, canola, and cottonseed. Salad dressings or mayonnaise made with a vegetable oil. Beverages Water (mineral or sparkling). Coffee and tea. Unsweetened ice tea. Diet beverages. Sweets and desserts Sherbet, gelatin, and fruit ice. Small amounts of dark chocolate. Limit all sweets and desserts. Seasonings and condiments All seasonings and condiments. The items listed above may not be a complete list of foods and beverages you can eat. Contact a dietitian for more options. What foods should I avoid? Fruits Canned fruit in heavy syrup. Fruit in cream or butter sauce. Fried fruit. Limit coconut. Vegetables Vegetables cooked in cheese, cream, or butter sauce. Fried vegetables. Grains Breads made with saturated or trans fats, oils, or whole milk. Croissants. Sweet rolls. Donuts. High-fat crackers, such as cheese crackers and chips. Meats and other proteins Fatty meats, such as hot dogs, ribs, sausage, bacon, rib-eye roast or steak. High-fat deli meats, such as salami and bologna. Caviar. Domestic duck and goose. Organ meats, such  as liver. Dairy Cream, sour cream, cream cheese, and creamed cottage cheese. Whole-milk cheeses. Whole or 2% milk (liquid, evaporated, or condensed). Whole buttermilk. Cream sauce or high-fat cheese sauce. Whole-milk yogurt. Fats and oils Meat fat, or shortening. Cocoa butter, hydrogenated oils, palm oil, coconut oil, palm kernel oil. Solid fats and shortenings, including bacon fat, salt pork, lard, and butter. Nondairy cream substitutes. Salad dressings with cheese or sour cream. Beverages Regular sodas and any drinks with added sugar. Sweets and desserts Frosting. Pudding. Cookies. Cakes. Pies. Milk chocolate or white chocolate. Buttered syrups. Full-fat ice cream or ice cream drinks. The items listed above may not be a complete list of foods and beverages to avoid. Contact a dietitian for more information. Summary Heart-healthy meal planning includes limiting unhealthy fats, increasing healthy fats, limiting salt (sodium) intake and making other diet and lifestyle changes. Lose weight if you are overweight. Losing just 5-10% of your body weight can help your overall health and prevent diseases such as diabetes and heart disease. Focus on eating a balance of foods, including fruits and vegetables, low-fat or nonfat dairy, lean protein, nuts and legumes, whole grains, and heart-healthy oils and fats. This information is not intended to replace advice given to you by your health care provider. Make sure you discuss any questions you have with your health care provider. Document  Revised: 06/17/2021 Document Reviewed: 06/17/2021 Elsevier Patient Education  [place].

## 2022-09-24 ENCOUNTER — Other Ambulatory Visit: Payer: Self-pay | Admitting: Interventional Cardiology

## 2022-10-06 ENCOUNTER — Encounter (HOSPITAL_BASED_OUTPATIENT_CLINIC_OR_DEPARTMENT_OTHER): Payer: Self-pay | Admitting: Pulmonary Disease

## 2022-10-06 ENCOUNTER — Ambulatory Visit (INDEPENDENT_AMBULATORY_CARE_PROVIDER_SITE_OTHER): Payer: Medicare Other | Admitting: Pulmonary Disease

## 2022-10-06 VITALS — BP 124/70 | HR 80 | Temp 98.0°F | Ht 62.0 in | Wt 252.6 lb

## 2022-10-06 DIAGNOSIS — Z01811 Encounter for preprocedural respiratory examination: Secondary | ICD-10-CM | POA: Diagnosis not present

## 2022-10-06 DIAGNOSIS — G4733 Obstructive sleep apnea (adult) (pediatric): Secondary | ICD-10-CM | POA: Diagnosis not present

## 2022-10-06 NOTE — Patient Instructions (Signed)
Follow up in 1 year.

## 2022-10-06 NOTE — Progress Notes (Signed)
[facility]  Chief Complaint  Patient presents with   Follow-up    Follow up. Patient has no complaints.     Past Surgical History:  She  has a past surgical history that includes Tubal ligation; Dilation and curettage of uterus; Carpal tunnel release (Right); Cholecystectomy (1987); Abdominal hysterectomy (2007); Breast surgery; Portacath placement (09/11/2011); Port-a-cath removal (06/22/2012); Eye surgery; port-a-cath insertion; Colonoscopy (N/A, 06/13/2013); Cardioversion (N/A, 06/02/2017); Mastectomy modified radical (Right, 08/14/11); RIGHT HEART CATH (N/A, 08/28/2021); TEE without cardioversion (N/A, 09/20/2021); and Bubble study (09/20/2021).  Past Medical History:  Pituitary adenoma, Peripheral neuropathy, Lymphedema Rt arm, HTN, HLD, Panuveitis, Glaucoma, Rt breast cancer 2013, Asthma, OA, Persistent A fib, CKD 3, Pulmonary venous hypertension  Constitutional:  BP 124/70 (BP Location: Left Arm, Patient Position: Sitting, Cuff Size: Normal)   Pulse 80   Temp 98 F (36.7 C) (Oral)   Ht 5\' 2"  (1.575 m)   Wt 252 lb 9.6 oz (114.6 kg)   SpO2 100%   BMI 46.20 kg/m   Brief Summary:  [name] is a 72 y.o. female with obstructive sleep apnea.      Subjective:   She was seen in ER in December for pneumonia.  Treated with augmentin and doxycycline.  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  Improved since then and had repeat CXR with her PCP.  Uses CPAP nightly.  No issues with mask fit or pressure setting.  Sleeps through the night.  Doesn't usually need to nap.  She continues to have pain in her knees.  She might need to have knee replacement surgery.  Physical Exam:   Appearance - well kempt   ENMT - no sinus tenderness, no oral exudate, no LAN, Mallampati 3 airway, no stridor  Respiratory - equal breath sounds bilaterally, no wheezing or rales  CV - s1s2 regular rate and rhythm, 2/6 SM  Ext - no clubbing, no  edema  Skin - no rashes  Psych - normal mood and affect     Sleep Tests:  PSG 06/09/97 >> AHI 37 ONO with CPAP 11/30/17 >> test time 6 hrs.  Baseline SpO2 95%, low SpO2 83%.  Spent 7 min with SpO2 < 88%. Auto CPAP 09/03/22 to 10/02/22 >> used on 29 of 30 nights with average 4 hrs 55 min.  Average AHI 3.8 with median CPAP 7 and 95 th percentile CPAP 10 cm H2O.  Air leak noted.  Cardiac Tests:  Echo 08/06/21 >> EF 65 to 70%, mod LVH, IV septum flattened, RVSP 57.8 mmHg, severe LA and RA dilation, mod MR, severe TR, mild/mod AR  Social History:  She  reports that she has never smoked. She has never used smokeless tobacco. She reports that she does not drink alcohol and does not use drugs.  Family History:  Her family history includes Arthritis in her mother; Cancer (age of onset: 37) in her father.     Assessment/Plan:   Obstructive sleep apnea. - she is compliant with CPAP and reports benefit from therapy - she uses [employer] for her DME - current CPAP ordered October 2023 - continue auto CPAP 5 to 15 cm H2O - will arrange for a new Resmed auto CPAP 5 to 15 cm H2O  History of pneumonia. - she reports having follow up CXR with her PCP - discussed symptoms to monitor for that would indicate a recurrent infection and emphasized importance of aggressively treating infections since she uses imuran  Persistent atrial fibrillation, Chronic diastolic CHF, Valvular heart disease, Pulmonary venous hypertension. - followed by Dr. Everette Rank and Dr. Marca Ancona with cardiology  Panuveitis, Glaucoma. - followed [facility] Dr. Gae Bon [facility]  Knee pain. - she is followed by Dr. Thomasena Edis with orthopedics - no pulmonary restrictions to her having surgery if needed    Time Spent Involved in Patient Care on Day of Examination:  27 minutes  Follow up:   Patient Instructions  Follow up in 1 year  Medication List:   Allergies as of 10/06/2022       Reactions    Trandolapril-verapamil Hcl Er Swelling   Causes lips to swell Jolene Provost" is the name brand) Tolerates amlodipine        Medication List        Accurate as of Oct 06, 2022  9:26 AM. If you have any questions, ask your nurse or doctor.          acetaminophen 325 MG tablet Commonly known as: TYLENOL Take 325-650 mg by mouth every 6 (six) hours as needed (for pain.).   acetaZOLAMIDE 250 MG tablet Commonly known as: DIAMOX Take 250 mg by mouth 2 (two) times daily. Pt takes 2 tablets, 500 mg Bid.   albuterol 108 (90 Base) MCG/ACT inhaler Commonly known as: ProAir HFA Inhale 1-2 puffs into the lungs every 6 (six) hours as needed for wheezing or shortness of breath. For shortness of breath   azaTHIOprine 50 MG tablet Commonly known as: IMURAN Take 100 mg by mouth daily.   Boostrix 5-2.5-18.5 LF-MCG/0.5 injection Generic drug: Tdap Inject into the muscle.   cabergoline 0.5 MG tablet Commonly known as: DOSTINEX Take 0.5 mg by mouth 2 (two) times a week. Tuesday and Thursday   chlorpheniramine-HYDROcodone 10-8 MG/5ML Commonly known as: TUSSIONEX Take 5 mLs by mouth every 12 (twelve) hours as needed for cough.   dorzolamide 2 % ophthalmic solution Commonly known as: TRUSOPT Place 1 drop into both eyes 2 (two) times daily.   GLUCOSAMINE HCL PO Take 1 tablet by mouth daily.   Jardiance 10 MG Tabs tablet Generic drug: empagliflozin TAKE 1 TABLET DAILY   latanoprost 0.005 % ophthalmic solution Commonly known as: XALATAN Place 1 drop into both eyes at bedtime.   metoprolol succinate 50 MG 24 hr tablet Commonly known as: TOPROL-XL Take 1.5 tablets (75 mg total) by mouth 2 (two) times daily.   multivitamin with minerals Tabs tablet Take 1 tablet by mouth daily with breakfast.   potassium chloride SA 20 MEQ tablet Commonly known as: KLOR-CON M Take 2 tablets (40 mEq total) by mouth 2 (two) times daily.   prednisoLONE acetate 1 % ophthalmic suspension Commonly known  as: PRED FORTE Place 1 drop into the right eye daily.   simvastatin 20 MG tablet Commonly known as: ZOCOR TAKE 1 TABLET AT BEDTIME   spironolactone 25 MG tablet Commonly known as: ALDACTONE Take 1 tablet (25 mg total) by mouth daily.   torsemide 20 MG tablet Commonly known as: DEMADEX Take 3 tablets (60 mg total) by mouth in the morning AND 2 tablets (40 mg total) every evening.   vitamin B-12 500 MCG tablet Commonly known as: CYANOCOBALAMIN Take 500 mcg by mouth daily.   Vitamin D3 25 MCG (1000 UT) Caps Take 1,000 Units by mouth every other day.   Xarelto 20 MG Tabs tablet Generic drug: rivaroxaban TAKE 1 TABLET DAILY WITH SUPPER        Signature:  Lawrnce Reyez  Craige Cotta, MD [facility] Pager - [phone] 10/06/2022, 9:26 AM

## 2022-10-15 ENCOUNTER — Telehealth: Payer: Self-pay

## 2022-10-15 NOTE — Telephone Encounter (Signed)
Faxed signed orders as per Dr. Mosetta Putt for mastectomy products. Received fax confirmation of receipt and original sent to be scanned into the patients file.

## 2022-11-18 ENCOUNTER — Ambulatory Visit (HOSPITAL_COMMUNITY)
Admission: RE | Admit: 2022-11-18 | Discharge: 2022-11-18 | Disposition: A | Discharge status: Home / Self Care | Payer: Medicare Other | Source: Ambulatory Visit | Attending: Cardiology | Admitting: Cardiology

## 2022-11-18 ENCOUNTER — Encounter (HOSPITAL_COMMUNITY): Payer: Self-pay | Admitting: Cardiology

## 2022-11-18 VITALS — BP 104/60 | HR 75 | Wt 250.4 lb

## 2022-11-18 DIAGNOSIS — Z79899 Other long term (current) drug therapy: Secondary | ICD-10-CM | POA: Diagnosis not present

## 2022-11-18 DIAGNOSIS — N1832 Chronic kidney disease, stage 3b: Secondary | ICD-10-CM | POA: Diagnosis not present

## 2022-11-18 DIAGNOSIS — I081 Rheumatic disorders of both mitral and tricuspid valves: Secondary | ICD-10-CM | POA: Insufficient documentation

## 2022-11-18 DIAGNOSIS — I272 Pulmonary hypertension, unspecified: Secondary | ICD-10-CM | POA: Insufficient documentation

## 2022-11-18 DIAGNOSIS — G4733 Obstructive sleep apnea (adult) (pediatric): Secondary | ICD-10-CM | POA: Insufficient documentation

## 2022-11-18 DIAGNOSIS — Z853 Personal history of malignant neoplasm of breast: Secondary | ICD-10-CM | POA: Diagnosis not present

## 2022-11-18 DIAGNOSIS — I4821 Permanent atrial fibrillation: Secondary | ICD-10-CM | POA: Diagnosis not present

## 2022-11-18 DIAGNOSIS — I340 Nonrheumatic mitral (valve) insufficiency: Secondary | ICD-10-CM

## 2022-11-18 DIAGNOSIS — Z7984 Long term (current) use of oral hypoglycemic drugs: Secondary | ICD-10-CM | POA: Insufficient documentation

## 2022-11-18 DIAGNOSIS — I130 Hypertensive heart and chronic kidney disease with heart failure and stage 1 through stage 4 chronic kidney disease, or unspecified chronic kidney disease: Secondary | ICD-10-CM | POA: Diagnosis not present

## 2022-11-18 DIAGNOSIS — I5032 Chronic diastolic (congestive) heart failure: Secondary | ICD-10-CM | POA: Diagnosis not present

## 2022-11-18 DIAGNOSIS — E785 Hyperlipidemia, unspecified: Secondary | ICD-10-CM | POA: Diagnosis not present

## 2022-11-18 DIAGNOSIS — I482 Chronic atrial fibrillation, unspecified: Secondary | ICD-10-CM | POA: Diagnosis not present

## 2022-11-18 DIAGNOSIS — E854 Organ-limited amyloidosis: Secondary | ICD-10-CM | POA: Insufficient documentation

## 2022-11-18 DIAGNOSIS — I43 Cardiomyopathy in diseases classified elsewhere: Secondary | ICD-10-CM | POA: Insufficient documentation

## 2022-11-18 DIAGNOSIS — Z7901 Long term (current) use of anticoagulants: Secondary | ICD-10-CM | POA: Insufficient documentation

## 2022-11-18 LAB — BASIC METABOLIC PANEL
Anion gap: 10 (ref 5–15)
BUN: 18 mg/dL (ref 8–23)
CO2: 27 mmol/L (ref 22–32)
Calcium: 9.2 mg/dL (ref 8.9–10.3)
Chloride: 101 mmol/L (ref 98–111)
Creatinine, Ser: 1.36 mg/dL — ABNORMAL HIGH (ref 0.44–1.00)
GFR, Estimated: 41 mL/min — ABNORMAL LOW (ref >60)
Glucose, Bld: 100 mg/dL — ABNORMAL HIGH (ref 70–99)
Potassium: 3.9 mmol/L (ref 3.5–5.1)
Sodium: 138 mmol/L (ref 135–145)

## 2022-11-18 LAB — BRAIN NATRIURETIC PEPTIDE: B Natriuretic Peptide: 324.4 pg/mL — ABNORMAL HIGH (ref 0.0–100.0)

## 2022-11-18 MED ORDER — EMPAGLIFLOZIN 10 MG PO TABS: 10.0000 mg | ORAL_TABLET | Freq: Every day | ORAL | 3 refills | Status: DC

## 2022-11-18 MED ORDER — TORSEMIDE 20 MG PO TABS: 60.0000 mg | ORAL_TABLET | Freq: Two times a day (BID) | ORAL | 3 refills | Status: DC

## 2022-11-18 MED ORDER — METOPROLOL SUCCINATE ER 50 MG PO TB24: 75.0000 mg | ORAL_TABLET | Freq: Two times a day (BID) | ORAL | 3 refills | Status: DC

## 2022-11-18 NOTE — Progress Notes (Signed)
Advanced Heart Failure Clinic Note    PCP: Delorse Lek, MD  Primary Cardiologist: Lance Muss, MD HF Cardiology: Dr Shirlee Latch  HPI: 72 y.o. female w/ chronic diastolic heart failure, RV dysfunction, pulmonary HTN, persistent atrial fibrillation, MR/TR, HTN, HLD, h/o rt sided breast cancer in 2013 tx w/ mastectomy, chemo + radiation, CKD stage IIIb and peripheral neuropathy,  w/ recent admit for a/c diastolic heart failure. Evaluated by cardiology and diuresed w/ IV Lasix. Was noted to be in rate controlled afib, persistent. Echo in 3/23 showed hyperdynamic LVEF, 65-70%, D shaped LV c/w RV volume overload, Grade II DD w/ mod LVH, mildly reduced RV and severely elevated RVSP, 58 mmHg, moderate MR w/ severe BAE and severe TR.   After diuresis, was switched back to oral lasix + Jardiance. Continued on Xarelto and metoprolol for afib. Referred to [facility] clinic. D/c Wt 279 lb. Reports full compliance w/ CPAP. Fully compliant w/ Xarelto. Denies h/o PE/DVT. No family h/o HF. Denies CP. Non smoker. + h/o asthma but no h/o COPD. No known h/o connective tissue disorder.    RHC was arranged from [facility] clinic, done in 4/23.  This showed markedly elevated right and left heart filling pressures with prominent v-waves in the PCWP tracing.  PVR not elevated, pulmonary venous hypertension.  Lasix was increased.   She was seen post cath 4/23 and remained fluid overloaded. Lasix was stopped. Switched to torsemide 60 mg bid. Amyloid work up arranged. Multiple Myeloma negative for M spike protein. Immunofixation unremarkable. She was also set up for TEE to better assess her mitral valve. This showed moderate MR w/ restriction of the posterior leaflet. ERO  0.13 cm^2 by PISA, vena contract area 0.21 cm^2. Also w/ mod-severe TR, normal LVEF 60-65% + mildly D-shaped interventricular septum suggestive of  RV pressure/volume overload.  Follow up 5/23, mild volume overload with NYHA II symptoms. Spiro 12.5 started.    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 Cardiac MRI in 7/23, however, was not suggestive of cardiac amyloidosis: LV EF 59%, no LGE, ECV 30%, moderate RV enlargement with RVEF 29%, biatrial enlargement, mild MR. [employer] gene testing was negative for hATTR.   Repeat PYP (10/23) equivocal for cardiac amyloidosis (grade 2, H/CL 1-1.5).   Today she returns for HF follow up.  Breathing is stable.  She can walk 1/4-1/2 mile with cane before she gets short of breath.  She is more limited by her knee pain.  Feels like her breathing and stamina is better than last year.  Weight down 1 lb.  No chest pain.  She can walk up her front steps without dyspnea.  No orthopnea/PND.  No lightheadedness.  No BRBPR/melena. She occasionally misses torsemide doses. She is using CPAP.   Labs (4/23): K 3.6, creatinine 1.21, hgb 11.6 Labs (4/23): Multiple Myeloma no M-spike protein, Immunofixation negative  Labs (5/23): K 4.7, creatinine 1.28, hgb 13.2 Labs (6/23): K 4, creatinine 1.35, SPEP negative, urine immunofixation negative.  Labs (10/23): K. 3.3, creatinine 1.67 Labs (5/24): K 3.2, creatinine 1.39   ECG (personally reviewed): atrial fibrillation rate 75, septal Qs  PMH: 1. CKD stage 3 2. OSA: uses CPAP.  3. Atrial fibrillation: Chronic.  It appears that she has been in AF since at least 2018.  4. Breast cancer on right in 2013: Taxotere/Cytoxan chemotherapy.  5. Peripheral neuropathy.  6. Chronic diastolic CHF with prominent RV failure: Echo in 3/23 with EF 65-70%, moderate LVH,  D-shaped septum, mild RVE with mildly decreased systolic function, PASP 58, severe biatrial enlargement, at least moderate MR, severe TR, mild-moderate AI, IVC dilated.  - RHC (4/23): mean RA 22, PA 69/31 mean 46, mean PCWP 30 with v-waves to 44, CI 2.82, PVR 2.6 WU.  - TEE (4/23): moderate MR w/ restriction of the posterior leaflet. ERO  0.13 cm^2 by PISA, vena  contract area 0.21 cm^2. Also w/ mod-severe TR, normal LVEF 60-65% + mildly D-shaped interventricular septum suggestive of  RV pressure/volume overload. - PYP (5/23) strongly suggestive of cardiac amyloidosis, grade 2, H/CL= 1.42 - Cardiac MRI (7/23): LV EF 59%, no LGE, ECV 30%, moderate RV enlargement with RVEF 29%, biatrial enlargement, mild MR. - PYP (10/23): Equivocal with grade 2, H/CL1-1.5 7. CKD stage 3  Review of Systems: All systems reviewed and negative except as per HPI.   Current Outpatient Medications  Medication Sig Dispense Refill   acetaminophen (TYLENOL) 325 MG tablet Take 325-650 mg by mouth every 6 (six) hours as needed (for pain.).     acetaZOLAMIDE (DIAMOX) 250 MG tablet Take 500 mg by mouth 2 (two) times daily.     albuterol (PROAIR HFA) 108 (90 Base) MCG/ACT inhaler Inhale 1-2 puffs into the lungs every 6 (six) hours as needed for wheezing or shortness of breath. For shortness of breath 18 g 3   azaTHIOprine (IMURAN) 50 MG tablet Take 100 mg by mouth daily.     cabergoline (DOSTINEX) 0.5 MG tablet Take 0.5 mg by mouth 2 (two) times a week. Tuesday and Thursday     chlorpheniramine-HYDROcodone (TUSSIONEX) 10-8 MG/5ML Take 5 mLs by mouth every 12 (twelve) hours as needed for cough. 70 mL 0   Cholecalciferol (VITAMIN D3) 1000 UNITS CAPS Take 1,000 Units by mouth every other day.      dorzolamide (TRUSOPT) 2 % ophthalmic solution Place 1 drop into both eyes 2 (two) times daily.     GLUCOSAMINE HCL PO Take 1 tablet by mouth daily.     latanoprost (XALATAN) 0.005 % ophthalmic solution Place 1 drop into both eyes at bedtime.     Multiple Vitamin (MULITIVITAMIN WITH MINERALS) TABS Take 1 tablet by mouth daily with breakfast.      potassium chloride SA (KLOR-CON M) 20 MEQ tablet Take 2 tablets (40 mEq total) by mouth 2 (two) times daily. 60 tablet 3   prednisoLONE acetate (PRED FORTE) 1 % ophthalmic suspension Place 1 drop into the right eye daily.     simvastatin (ZOCOR) 20 MG  tablet TAKE 1 TABLET AT BEDTIME 90 tablet 3   spironolactone (ALDACTONE) 25 MG tablet Take 1 tablet (25 mg total) by mouth daily. 90 tablet 3   vitamin B-12 (CYANOCOBALAMIN) 500 MCG tablet Take 500 mcg by mouth daily.     XARELTO 20 MG TABS tablet TAKE 1 TABLET DAILY WITH SUPPER 90 tablet 3   empagliflozin (JARDIANCE) 10 MG TABS tablet Take 1 tablet (10 mg total) by mouth daily. 90 tablet 3   metoprolol succinate (TOPROL-XL) 50 MG 24 hr tablet Take 1.5 tablets (75 mg total) by mouth 2 (two) times daily. 270 tablet 3   torsemide (DEMADEX) 20 MG tablet Take 3 tablets (60 mg total) by mouth 2 (two) times daily. 150 tablet 3   No current facility-administered medications for this encounter.   Facility-Administered Medications Ordered in Other Encounters  Medication Dose Route Frequency Provider Last Rate Last Admin   lidocaine (cardiac) 100 mg/40ml (XYLOCAINE) 20 MG/ML injection 2%  Anesthesia Intra-op Epimenio Sarin, CRNA   60 mg at 06/02/17 1343   Allergies  Allergen Reactions   Trandolapril-Verapamil Hcl Er Swelling    Causes lips to swell Jolene Provost" is the name brand) Tolerates amlodipine   Social History   Socioeconomic History   Marital status: Married    Spouse name: Not on file   Number of children: Not on file   Years of education: Not on file   Highest education level: Not on file  Occupational History   Not on file  Tobacco Use   Smoking status: Never   Smokeless tobacco: Never  Vaping Use   Vaping Use: Never used  Substance and Sexual Activity   Alcohol use: No   Drug use: No   Sexual activity: Yes    Birth control/protection: Post-menopausal, Surgical  Other Topics Concern   Not on file  Social History Narrative   Not on file   Social Determinants of Health   Financial Resource Strain: Not on file  Food Insecurity: Not on file  Transportation Needs: Not on file  Physical Activity: Not on file  Stress: Not on file  Social Connections: Not on file   Intimate Partner Violence: Not on file   Family History  Problem Relation Age of Onset   Arthritis Mother    Cancer Father 91       prostate ca   Breast cancer Neg Hx    Wt Readings from Last 3 Encounters:  11/18/22 113.6 kg (250 lb 6.4 oz)  10/06/22 114.6 kg (252 lb 9.6 oz)  08/21/22 116.7 kg (257 lb 3.2 oz)   BP 104/60   Pulse 75   Wt 113.6 kg (250 lb 6.4 oz)   SpO2 97%   BMI 45.80 kg/m   PHYSICAL EXAM: General: NAD, obese Neck: JVP 10-12 cm, no thyromegaly or thyroid nodule.  Lungs: Clear to auscultation bilaterally with normal respiratory effort. CV: Nondisplaced PMI.  Heart irregular S1/S2, no S3/S4, 2/6 HSM LLSB.  Trace ankle edema.  No carotid bruit.  Difficult to palpate pedal pulses.  Abdomen: Soft, nontender, no hepatosplenomegaly, no distention.  Skin: Intact without lesions or rashes.  Neurologic: Alert and oriented x 3.  Psych: Normal affect. Extremities: No clubbing or cyanosis.  HEENT: Normal.   ASSESSMENT & PLAN: 1. Chronic diastolic CHF with prominent RV dysfunction: Echo in 3/23 with EF 65-70%, moderate LVH, D-shaped septum, mild RVE with mildly decreased systolic function, PASP 58, severe biatrial enlargement, at least moderate MR, severe TR, mild-moderate AI, IVC dilated. RHC in 4/23 showed markedly elevated right and left heart filling pressures with prominent v-waves in the PCWP tracing.  PVR not elevated, pulmonary venous hypertension.  LV and RV failure components with valvular heart disease.  Chronic atrial fibrillation may also be a major driver for CHF. Multiple Myeloma study and immunofixation both normal. PYP 5/23 suggestive but not diagnostic of cardiac amyloidosis. However, cardiac MRI was NOT suggestive of cardiac amyloidosis with no LGE and no significant ECV percentage elevation.  [employer] gene testing was negative for hATTR.  Repeat PYP scan in 10/23 was equivocal.  Cardiac amyloidosis diagnosis still remains uncertain.  NYHA class III symptoms,  she does look volume overloaded.  Sometimes misses torsemide.  - Increase torsemide to 60 mg bid and try not to miss torsemide doses.  BMET/BNP today and again in 10 days.  - Continue spironolactone 25 mg daily.  - Continue empagliflozin 10 mg daily.  - No Entresto (h/o angioedema w/ ACEi).  -  Continue Toprol XL 75 mg bid.  - I will arrange for echo.  - We discussed arranging for an abdominal fat pad biopsy to stain for amyloid.  She is agreeable, so I will refer to general surgery.  2. Pulmonary hypertension: This appears to be pulmonary venous hypertension.  Treatment will be diuresis.  3. Permanent atrial fibrillation: Severe biatrial enlargement, AF present for several years. She is well rate-controlled. Unlikely to successfully cardiovert even with anti-arrhythmic given severe LAE and chronicity.  - Continue Xarelto.  4. OSA: Continue CPAP.  5. Mitral Regurgitation: Possible atrial functional MR with severe LAE. TEE 4/23 showed moderate MR w/ restriction of the posterior leaflet. ERO  0.13 cm^2 by PISA, vena contract area 0.21 cm^2.  - Repeat echo as above.  5. Tricuspid Regurgitation: Mod-severe on TEE 4/23.  Medically manage for now, may be candidate down the road for percutaneous repair.  - Repeat echo as above.  6. CKD: Stage 3. Last SCr 1.39.  - Continue empagliflozin  Followup with APP in 3 wks.   Marca Ancona, 11/18/2022

## 2022-11-18 NOTE — Patient Instructions (Signed)
CHANGE Torsemide to 60 mg Twice daily  Labs done today, your results will be available in MyChart, we will contact you for abnormal readings.  Repeat blood work in 10 days.  Your physician has requested that you have an echocardiogram. Echocardiography is a painless test that uses sound waves to create images of your heart. It provides your doctor with information about the size and shape of your heart and how well your heart's chambers and valves are working. This procedure takes approximately one hour. There are no restrictions for this procedure. Please do NOT wear cologne, perfume, aftershave, or lotions (deodorant is allowed). Please arrive 15 minutes prior to your appointment time.  You have been referred to General surgery for your biopsy. They will call you to arrange your appointment.   Your physician recommends that you schedule a follow-up appointment in: 3 weeks  If you have any questions or concerns before your next appointment please send [name] a message through [place] or call our office at [phone].    TO LEAVE A MESSAGE FOR THE NURSE SELECT OPTION 2, PLEASE LEAVE A MESSAGE INCLUDING: YOUR NAME DATE OF BIRTH CALL BACK NUMBER REASON FOR CALL**this is important as we prioritize the call backs  YOU WILL RECEIVE A CALL BACK THE SAME DAY AS LONG AS YOU CALL BEFORE 4:00 PM  At the [facility], you and your health needs are our priority. As part of our continuing mission to provide you with exceptional heart care, we have created designated Provider Care Teams. These Care Teams include your primary Cardiologist (physician) and Advanced Practice Providers (APPs- Physician Assistants and Nurse Practitioners) who all work together to provide you with the care you need, when you need it.   You may see any of the following providers on your designated Care Team at your next follow up: Dr Arvilla Meres Dr Marca Ancona Dr. Marcos Eke,  NP Robbie Lis, [place] [facility], [place] Brynda Peon, NP Karle Plumber, PharmD   Please be sure to bring in all your medications bottles to every appointment.    Thank you for choosing [facility]

## 2022-11-19 ENCOUNTER — Telehealth (HOSPITAL_COMMUNITY): Payer: Self-pay

## 2022-11-19 NOTE — Telephone Encounter (Signed)
Referral faxed to [facility] for Fat Pad Biopsy

## 2022-11-21 NOTE — Telephone Encounter (Signed)
[name] with [facility] [phone]) called to report referral received  Referral reviewed and br Dr Doylene Canard  -not appropriate for [facility]  Fat pad bx should be done in IR

## 2022-11-25 ENCOUNTER — Other Ambulatory Visit (HOSPITAL_COMMUNITY): Payer: Self-pay

## 2022-11-26 ENCOUNTER — Other Ambulatory Visit (HOSPITAL_COMMUNITY): Payer: Self-pay

## 2022-11-26 DIAGNOSIS — I5032 Chronic diastolic (congestive) heart failure: Secondary | ICD-10-CM

## 2022-11-28 ENCOUNTER — Ambulatory Visit (HOSPITAL_COMMUNITY)
Admission: RE | Admit: 2022-11-28 | Discharge: 2022-11-28 | Disposition: A | Discharge status: Home / Self Care | Payer: Medicare Other | Source: Ambulatory Visit | Attending: Cardiology | Admitting: Cardiology

## 2022-11-28 DIAGNOSIS — I272 Pulmonary hypertension, unspecified: Secondary | ICD-10-CM | POA: Diagnosis not present

## 2022-11-28 LAB — BASIC METABOLIC PANEL
Anion gap: 11 (ref 5–15)
BUN: 22 mg/dL (ref 8–23)
CO2: 25 mmol/L (ref 22–32)
Calcium: 9.4 mg/dL (ref 8.9–10.3)
Chloride: 103 mmol/L (ref 98–111)
Creatinine, Ser: 1.35 mg/dL — ABNORMAL HIGH (ref 0.44–1.00)
GFR, Estimated: 41 mL/min — ABNORMAL LOW (ref >60)
Glucose, Bld: 93 mg/dL (ref 70–99)
Potassium: 4.3 mmol/L (ref 3.5–5.1)
Sodium: 139 mmol/L (ref 135–145)

## 2022-11-28 NOTE — Progress Notes (Signed)
Oley Balm, MD  Leodis Rains D PROCEDURE / BIOPSY REVIEW Date: 11/28/22  Requested Biopsy site: fat pad Reason for request: PYP 5/23 suggestive but not diagnostic of cardiac amyloidosis. However, cardiac MRI was NOT suggestive of cardiac amyloidosis with no LGE and no significant ECV percentage elevation.  [employer] gene testing was negative for hATTR.  Repeat PYP scan in 10/23 was equivocal.  Cardiac amyloidosis diagnosis still remains uncertain. Imaging review: None relevant.  Decision: Approved Imaging modality to perform: Ultrasound Schedule with: No sedation / Local anesthetic Schedule for: Any VIR  Additional comments:   Please contact me with questions, concerns, or if issue pertaining to this request arise.  Dayne Oley Balm, MD [facility]

## 2022-12-08 NOTE — Progress Notes (Signed)
Advanced Heart Failure Clinic Note  PCP: Delorse Lek, MD  Primary Cardiologist: Lance Muss, MD HF Cardiology: Dr Shirlee Latch  HPI: 72 y.o. female w/ chronic diastolic heart failure, RV dysfunction, pulmonary HTN, persistent atrial fibrillation, MR/TR, HTN, HLD, h/o rt sided breast cancer in 2013 tx w/ mastectomy, chemo + radiation, CKD stage IIIb and peripheral neuropathy,  w/ recent admit for a/c diastolic heart failure. Evaluated by cardiology and diuresed w/ IV Lasix. Was noted to be in rate controlled afib, persistent. Echo in 3/23 showed hyperdynamic LVEF, 65-70%, D shaped LV c/w RV volume overload, Grade II DD w/ mod LVH, mildly reduced RV and severely elevated RVSP, 58 mmHg, moderate MR w/ severe BAE and severe TR.   After diuresis, was switched back to oral lasix + Jardiance. Continued on Xarelto and metoprolol for afib. Referred to [facility] clinic. D/c Wt 279 lb. Reports full compliance w/ CPAP. Fully compliant w/ Xarelto. Denies h/o PE/DVT. No family h/o HF. Denies CP. Non smoker. + h/o asthma but no h/o COPD. No known h/o connective tissue disorder.    RHC was arranged from [facility] clinic, done in 4/23.  This showed markedly elevated right and left heart filling pressures with prominent v-waves in the PCWP tracing.  PVR not elevated, pulmonary venous hypertension.  Lasix was increased.   She was seen post cath 4/23 and remained fluid overloaded. Lasix was stopped. Switched to torsemide 60 mg bid. Amyloid work up arranged. Multiple Myeloma negative for M spike protein. Immunofixation unremarkable. She was also set up for TEE to better assess her mitral valve. This showed moderate MR w/ restriction of the posterior leaflet. ERO  0.13 cm^2 by PISA, vena contract area 0.21 cm^2. Also w/ mod-severe TR, normal LVEF 60-65% + mildly D-shaped interventricular septum suggestive of  RV pressure/volume overload.  Follow up 5/23, mild volume overload with NYHA II symptoms. Spiro 12.5 started.   PYP  5/23 showed visual and quantitative assessments (grade 2, H/CL= 1.42), concerning for cardiac amyloidosis. Cardiac MRI in 7/23, however, was not suggestive of cardiac amyloidosis: LV EF 59%, no LGE, ECV 30%, moderate RV enlargement with RVEF 29%, biatrial enlargement, mild MR. [employer] gene testing was negative for hATTR.   Repeat PYP (10/23) equivocal for cardiac amyloidosis (grade 2, H/CL 1-1.5).   Follow up 6/24, volume overloaded and torsemide increased to 60 bid. Arranged for repeat echo, and referred to General Surgery for abdominal pad pad biopsy to stain for amyloid.  Today she returns for HF follow up. Overall feeling fine. She is not SOB walking with her cane on flat ground. Limited by knee pain. Legs are swelling more, not wearing compression hose today. Denies palpitations, abnormal bleeding, CP, dizziness, or PND/Orthopnea. Appetite ok. No fever or chills. Weight at home 246 pounds. Unfortunately her autistic daughter has been hospitalized with stomach CA, and she has been caring for her and unable to take her torsemide as directed. Using her CPAP.  Echo today 12/09/22, EF appears ~55% on my read, but official MD read pending.  ReDs: 23%  Labs (4/23): K 3.6, creatinine 1.21, hgb 11.6 Labs (4/23): Multiple Myeloma no M-spike protein, Immunofixation negative  Labs (5/23): K 4.7, creatinine 1.28, hgb 13.2 Labs (6/23): K 4, creatinine 1.35, SPEP negative, urine immunofixation negative.  Labs (10/23): K. 3.3, creatinine 1.67 Labs (5/24): K 3.2, creatinine 1.39  Labs (7/24): K 4.3, creatinine 1.35  ECG (personally reviewed): none ordered today  PMH: 1. CKD stage 3 2. OSA: uses CPAP.  3. Atrial  fibrillation: Chronic.  It appears that she has been in AF since at least 2018.  4. Breast cancer on right in 2013: Taxotere/Cytoxan chemotherapy.  5. Peripheral neuropathy.  6. Chronic diastolic CHF with prominent RV failure: Echo in 3/23 with EF 65-70%, moderate LVH, D-shaped septum, mild RVE  with mildly decreased systolic function, PASP 58, severe biatrial enlargement, at least moderate MR, severe TR, mild-moderate AI, IVC dilated.  - RHC (4/23): mean RA 22, PA 69/31 mean 46, mean PCWP 30 with v-waves to 44, CI 2.82, PVR 2.6 WU.  - TEE (4/23): moderate MR w/ restriction of the posterior leaflet. ERO  0.13 cm^2 by PISA, vena contract area 0.21 cm^2. Also w/ mod-severe TR, normal LVEF 60-65% + mildly D-shaped interventricular septum suggestive of  RV pressure/volume overload. - PYP (5/23) strongly suggestive of cardiac amyloidosis, grade 2, H/CL= 1.42 - Cardiac MRI (7/23): LV EF 59%, no LGE, ECV 30%, moderate RV enlargement with RVEF 29%, biatrial enlargement, mild MR. - PYP (10/23): Equivocal with grade 2, H/CL1-1.5 7. CKD stage 3  Review of Systems: All systems reviewed and negative except as per HPI.   Current Outpatient Medications  Medication Sig Dispense Refill   acetaminophen (TYLENOL) 325 MG tablet Take 325-650 mg by mouth every 6 (six) hours as needed (for pain.).     acetaZOLAMIDE (DIAMOX) 250 MG tablet Take 500 mg by mouth 2 (two) times daily.     albuterol (PROAIR HFA) 108 (90 Base) MCG/ACT inhaler Inhale 1-2 puffs into the lungs every 6 (six) hours as needed for wheezing or shortness of breath. For shortness of breath 18 g 3   azaTHIOprine (IMURAN) 50 MG tablet Take 100 mg by mouth daily.     cabergoline (DOSTINEX) 0.5 MG tablet Take 0.5 mg by mouth 2 (two) times a week. Tuesday and Thursday     chlorpheniramine-HYDROcodone (TUSSIONEX) 10-8 MG/5ML Take 5 mLs by mouth every 12 (twelve) hours as needed for cough. 70 mL 0   Cholecalciferol (VITAMIN D3) 1000 UNITS CAPS Take 1,000 Units by mouth every other day.      dorzolamide (TRUSOPT) 2 % ophthalmic solution Place 1 drop into both eyes 2 (two) times daily.     empagliflozin (JARDIANCE) 10 MG TABS tablet Take 1 tablet (10 mg total) by mouth daily. 90 tablet 3   GLUCOSAMINE HCL PO Take 1 tablet by mouth daily.      latanoprost (XALATAN) 0.005 % ophthalmic solution Place 1 drop into both eyes at bedtime.     metoprolol succinate (TOPROL-XL) 50 MG 24 hr tablet Take 1.5 tablets (75 mg total) by mouth 2 (two) times daily. 270 tablet 3   Multiple Vitamin (MULITIVITAMIN WITH MINERALS) TABS Take 1 tablet by mouth daily with breakfast.      potassium chloride SA (KLOR-CON M) 20 MEQ tablet Take 2 tablets (40 mEq total) by mouth 2 (two) times daily. 60 tablet 3   prednisoLONE acetate (PRED FORTE) 1 % ophthalmic suspension Place 1 drop into the right eye daily.     simvastatin (ZOCOR) 20 MG tablet TAKE 1 TABLET AT BEDTIME 90 tablet 3   spironolactone (ALDACTONE) 25 MG tablet Take 1 tablet (25 mg total) by mouth daily. 90 tablet 3   torsemide (DEMADEX) 20 MG tablet Take 3 tablets (60 mg total) by mouth 2 (two) times daily. 150 tablet 3   vitamin B-12 (CYANOCOBALAMIN) 500 MCG tablet Take 500 mcg by mouth daily.     XARELTO 20 MG TABS tablet TAKE 1 TABLET DAILY  WITH SUPPER 90 tablet 3   No current facility-administered medications for this encounter.   Facility-Administered Medications Ordered in Other Encounters  Medication Dose Route Frequency Provider Last Rate Last Admin   lidocaine (cardiac) 100 mg/7ml (XYLOCAINE) 20 MG/ML injection 2%    Anesthesia Intra-op Epimenio Sarin, CRNA   60 mg at 06/02/17 1343   Allergies  Allergen Reactions   Trandolapril-Verapamil Hcl Er Swelling    Causes lips to swell Jolene Provost" is the name brand) Tolerates amlodipine   Social History   Socioeconomic History   Marital status: Married    Spouse name: Not on file   Number of children: Not on file   Years of education: Not on file   Highest education level: Not on file  Occupational History   Not on file  Tobacco Use   Smoking status: Never   Smokeless tobacco: Never  Vaping Use   Vaping status: Never Used  Substance and Sexual Activity   Alcohol use: No   Drug use: No   Sexual activity: Yes    Birth  control/protection: Post-menopausal, Surgical  Other Topics Concern   Not on file  Social History Narrative   Not on file   Social Determinants of Health   Financial Resource Strain: Not on file  Food Insecurity: Not on file  Transportation Needs: Not on file  Physical Activity: Not on file  Stress: Not on file  Social Connections: Not on file  Intimate Partner Violence: Not on file   Family History  Problem Relation Age of Onset   Arthritis Mother    Cancer Father 45       prostate ca   Breast cancer Neg Hx    Wt Readings from Last 3 Encounters:  12/09/22 114.2 kg (251 lb 12.8 oz)  11/18/22 113.6 kg (250 lb 6.4 oz)  10/06/22 114.6 kg (252 lb 9.6 oz)   BP 110/66   Pulse 71   Wt 114.2 kg (251 lb 12.8 oz)   SpO2 99%   BMI 46.05 kg/m   PHYSICAL EXAM: General:  NAD. No resp difficulty, arrived in [facility] HEENT: Normal Neck: Supple. No JVD. Carotids 2+ bilat; no bruits. No lymphadenopathy or thryomegaly appreciated. Cor: PMI nondisplaced. Irregular rate & rhythm. No rubs, gallops, 2/6 HSM LLSB Lungs: Clear Abdomen: Soft, obese, nontender, nondistended. No hepatosplenomegaly. No bruits or masses. Good bowel sounds. Extremities: No cyanosis, clubbing, rash, 1+ BLE edema w/ likely chronic lymphedema Neuro: Alert & oriented x 3, cranial nerves grossly intact. Moves all 4 extremities w/o difficulty. Affect pleasant.  ASSESSMENT & PLAN: 1. Chronic diastolic CHF with prominent RV dysfunction: Echo in 3/23 with EF 65-70%, moderate LVH, D-shaped septum, mild RVE with mildly decreased systolic function, PASP 58, severe biatrial enlargement, at least moderate MR, severe TR, mild-moderate AI, IVC dilated. RHC in 4/23 showed markedly elevated right and left heart filling pressures with prominent v-waves in the PCWP tracing.  PVR not elevated, pulmonary venous hypertension.  LV and RV failure components with valvular heart disease.  Chronic atrial fibrillation may also be a major driver for  CHF. Multiple Myeloma study and immunofixation both normal. PYP 5/23 suggestive but not diagnostic of cardiac amyloidosis. However, cardiac MRI was NOT suggestive of cardiac amyloidosis with no LGE and no significant ECV percentage elevation.  [employer] gene testing was negative for hATTR.  Repeat PYP scan in 10/23 was equivocal.  Cardiac amyloidosis diagnosis still remains uncertain.  NYHA class II-III symptoms, she does look volume overloaded. Echo today, official  results pending. - Increase torsemide to 60 mg bid + 40 KCL bid. Recent labs stable, repeat BMET and BNP in 2 weeks. - Continue spironolactone 25 mg daily.  - Continue empagliflozin 10 mg daily. No GU symptoms. - No Entresto (h/o angioedema w/ ACEi).  - Continue Toprol XL 75 mg bid.  - She has been referred to general surgery for abdominal fat pad biopsy to stain for amyloid. 2. Pulmonary hypertension: This appears to be pulmonary venous hypertension.  Treatment will be diuresis.  3. Permanent atrial fibrillation: Severe biatrial enlargement, AF present for several years. She is well rate-controlled. Unlikely to successfully cardiovert even with anti-arrhythmic given severe LAE and chronicity.  - Continue Xarelto. No bleeding issues. 4. OSA: Continue CPAP.  5. Mitral Regurgitation: Possible atrial functional MR with severe LAE. TEE 4/23 showed moderate MR w/ restriction of the posterior leaflet. ERO  0.13 cm^2 by PISA, vena contract area 0.21 cm^2.  - Echo today, results pending. 5. Tricuspid Regurgitation: Mod-severe on TEE 4/23.  Medically manage for now, may be candidate down the road for percutaneous repair.  - Echo today, results pending. 6. CKD: Stage 3. Last SCr 1.35.  - Continue empagliflozin.  Follow up in 3-4 months with Dr. Shirlee Latch.  Anderson Malta [place], FNP-BC 12/09/2022

## 2022-12-09 ENCOUNTER — Ambulatory Visit (HOSPITAL_COMMUNITY)
Admission: RE | Admit: 2022-12-09 | Discharge: 2022-12-09 | Disposition: A | Discharge status: Home / Self Care | Payer: Medicare Other | Source: Ambulatory Visit | Attending: Family Medicine | Admitting: Family Medicine

## 2022-12-09 ENCOUNTER — Encounter (HOSPITAL_COMMUNITY): Payer: Self-pay

## 2022-12-09 VITALS — BP 110/66 | HR 71 | Wt 251.8 lb

## 2022-12-09 DIAGNOSIS — G4733 Obstructive sleep apnea (adult) (pediatric): Secondary | ICD-10-CM | POA: Diagnosis not present

## 2022-12-09 DIAGNOSIS — C9000 Multiple myeloma not having achieved remission: Secondary | ICD-10-CM | POA: Diagnosis not present

## 2022-12-09 DIAGNOSIS — I272 Pulmonary hypertension, unspecified: Secondary | ICD-10-CM | POA: Diagnosis not present

## 2022-12-09 DIAGNOSIS — Z853 Personal history of malignant neoplasm of breast: Secondary | ICD-10-CM | POA: Diagnosis not present

## 2022-12-09 DIAGNOSIS — I071 Rheumatic tricuspid insufficiency: Secondary | ICD-10-CM

## 2022-12-09 DIAGNOSIS — G629 Polyneuropathy, unspecified: Secondary | ICD-10-CM | POA: Diagnosis not present

## 2022-12-09 DIAGNOSIS — Z79899 Other long term (current) drug therapy: Secondary | ICD-10-CM | POA: Diagnosis not present

## 2022-12-09 DIAGNOSIS — I5032 Chronic diastolic (congestive) heart failure: Secondary | ICD-10-CM | POA: Diagnosis present

## 2022-12-09 DIAGNOSIS — I4821 Permanent atrial fibrillation: Secondary | ICD-10-CM | POA: Diagnosis not present

## 2022-12-09 DIAGNOSIS — I340 Nonrheumatic mitral (valve) insufficiency: Secondary | ICD-10-CM | POA: Diagnosis not present

## 2022-12-09 DIAGNOSIS — Z7984 Long term (current) use of oral hypoglycemic drugs: Secondary | ICD-10-CM | POA: Insufficient documentation

## 2022-12-09 DIAGNOSIS — I130 Hypertensive heart and chronic kidney disease with heart failure and stage 1 through stage 4 chronic kidney disease, or unspecified chronic kidney disease: Secondary | ICD-10-CM | POA: Insufficient documentation

## 2022-12-09 DIAGNOSIS — N1832 Chronic kidney disease, stage 3b: Secondary | ICD-10-CM | POA: Diagnosis not present

## 2022-12-09 DIAGNOSIS — Z7901 Long term (current) use of anticoagulants: Secondary | ICD-10-CM | POA: Insufficient documentation

## 2022-12-09 DIAGNOSIS — I081 Rheumatic disorders of both mitral and tricuspid valves: Secondary | ICD-10-CM | POA: Insufficient documentation

## 2022-12-09 DIAGNOSIS — N183 Chronic kidney disease, stage 3 unspecified: Secondary | ICD-10-CM

## 2022-12-09 DIAGNOSIS — E785 Hyperlipidemia, unspecified: Secondary | ICD-10-CM | POA: Insufficient documentation

## 2022-12-09 DIAGNOSIS — I482 Chronic atrial fibrillation, unspecified: Secondary | ICD-10-CM

## 2022-12-09 LAB — ECHOCARDIOGRAM COMPLETE
AR max vel: 2.62 cm2
AV Area VTI: 2.45 cm2
AV Area mean vel: 2.39 cm2
AV Mean grad: 3 mmHg
AV Peak grad: 5.9 mmHg
AV Vena cont: 0.7 cm
Ao pk vel: 1.21 m/s
Area-P 1/2: 5.2 cm2
MV M vel: 4.61 m/s
MV Peak grad: 85 mmHg
S' Lateral: 2.5 cm

## 2022-12-09 NOTE — Patient Instructions (Addendum)
Thank you for coming in today  If you had labs drawn today, any labs that are abnormal the clinic will call you No news is good news  Medications: No changes  Follow up appointments:  Your physician recommends that you return for lab work in: 2 weeks BNP BMET  Your physician recommends that you schedule a follow-up appointment in:  3-4 months With Dr. Earlean Shawl will receive a reminder letter in the mail a few months in advance. If you don't receive a letter, please call our office to schedule the follow-up appointment.    Do the following things EVERYDAY: Weigh yourself in the morning before breakfast. Write it down and keep it in a log. Take your medicines as prescribed Eat low salt foods--Limit salt (sodium) to 2000 mg per day.  Stay as active as you can everyday Limit all fluids for the day to less than 2 liters   At the [facility], you and your health needs are our priority. As part of our continuing mission to provide you with exceptional heart care, we have created designated Provider Care Teams. These Care Teams include your primary Cardiologist (physician) and Advanced Practice Providers (APPs- Physician Assistants and Nurse Practitioners) who all work together to provide you with the care you need, when you need it.   You may see any of the following providers on your designated Care Team at your next follow up: Dr Arvilla Meres Dr Marca Ancona Dr. Marcos Eke, NP Robbie Lis, [place] [facility], [place] Brynda Peon, NP Karle Plumber, PharmD   Please be sure to bring in all your medications bottles to every appointment.    Thank you for choosing [facility]  If you have any questions or concerns before your next appointment please send [name] a message through [place] or call our office at [phone].    TO LEAVE A MESSAGE FOR THE NURSE SELECT OPTION 2, PLEASE LEAVE A  MESSAGE INCLUDING: YOUR NAME DATE OF BIRTH CALL BACK NUMBER REASON FOR CALL**this is important as we prioritize the call backs  YOU WILL RECEIVE A CALL BACK THE SAME DAY AS LONG AS YOU CALL BEFORE 4:00 PM

## 2022-12-09 NOTE — Progress Notes (Signed)
ReDS Vest / Clip - 12/09/22 0900       ReDS Vest / Clip   Station Marker B    Ruler Value 36    ReDS Value Range Low volume    ReDS Actual Value 23

## 2022-12-09 NOTE — Progress Notes (Signed)
H&V Care Navigation CSW Progress Note  Clinical Social Worker met with patient to complete SDoH screen.  Patient is participating in a Managed Medicaid Plan:  NO  CSW met with patient to complete SDoH screen. Patient identified no concerns and denies any needs. Lasandra Beech, LCSW, CCSW-MCS [phone]   SDOH Screenings   Food Insecurity: No Food Insecurity (12/09/2022)  Housing: Low Risk  (12/09/2022)  Transportation Needs: No Transportation Needs (12/09/2022)  Utilities: Not At Risk (12/09/2022)  Financial Resource Strain: Low Risk  (12/09/2022)  Tobacco Use: Low Risk  (12/09/2022)  Health Literacy: Adequate Health Literacy (12/09/2022)

## 2022-12-09 NOTE — Progress Notes (Signed)
*  PRELIMINARY RESULTS* Echocardiogram 2D Echocardiogram has been performed.  [name] 12/09/2022, 8:49 AM

## 2022-12-23 ENCOUNTER — Ambulatory Visit (HOSPITAL_COMMUNITY)
Admission: RE | Admit: 2022-12-23 | Discharge: 2022-12-23 | Disposition: A | Discharge status: Home / Self Care | Payer: Medicare Other | Source: Ambulatory Visit | Attending: Cardiology | Admitting: Cardiology

## 2022-12-23 DIAGNOSIS — I5032 Chronic diastolic (congestive) heart failure: Secondary | ICD-10-CM | POA: Insufficient documentation

## 2022-12-23 LAB — BASIC METABOLIC PANEL
Anion gap: 12 (ref 5–15)
BUN: 21 mg/dL (ref 8–23)
CO2: 26 mmol/L (ref 22–32)
Calcium: 9.2 mg/dL (ref 8.9–10.3)
Chloride: 100 mmol/L (ref 98–111)
Creatinine, Ser: 1.14 mg/dL — ABNORMAL HIGH (ref 0.44–1.00)
GFR, Estimated: 51 mL/min — ABNORMAL LOW (ref >60)
Glucose, Bld: 100 mg/dL — ABNORMAL HIGH (ref 70–99)
Potassium: 3.3 mmol/L — ABNORMAL LOW (ref 3.5–5.1)
Sodium: 138 mmol/L (ref 135–145)

## 2022-12-23 LAB — BRAIN NATRIURETIC PEPTIDE: B Natriuretic Peptide: 260.7 pg/mL — ABNORMAL HIGH (ref 0.0–100.0)

## 2022-12-30 ENCOUNTER — Telehealth (HOSPITAL_COMMUNITY): Payer: Self-pay | Admitting: Cardiology

## 2022-12-30 DIAGNOSIS — I5032 Chronic diastolic (congestive) heart failure: Secondary | ICD-10-CM

## 2022-12-30 MED ORDER — POTASSIUM CHLORIDE CRYS ER 20 MEQ PO TBCR: 60.0000 meq | EXTENDED_RELEASE_TABLET | Freq: Two times a day (BID) | ORAL | 3 refills | Status: DC

## 2022-12-30 NOTE — Telephone Encounter (Signed)
Pt returned call Aware of 7/30 labs results Follow up labs scheduled Med list update

## 2023-01-13 ENCOUNTER — Ambulatory Visit (HOSPITAL_COMMUNITY)
Admission: RE | Admit: 2023-01-13 | Discharge: 2023-01-13 | Disposition: A | Discharge status: Home / Self Care | Payer: Medicare Other | Source: Ambulatory Visit | Attending: Internal Medicine | Admitting: Internal Medicine

## 2023-01-13 DIAGNOSIS — I5032 Chronic diastolic (congestive) heart failure: Secondary | ICD-10-CM

## 2023-01-13 LAB — BASIC METABOLIC PANEL
Anion gap: 10 (ref 5–15)
BUN: 17 mg/dL (ref 8–23)
CO2: 25 mmol/L (ref 22–32)
Calcium: 9.3 mg/dL (ref 8.9–10.3)
Chloride: 105 mmol/L (ref 98–111)
Creatinine, Ser: 1.27 mg/dL — ABNORMAL HIGH (ref 0.44–1.00)
GFR, Estimated: 44 mL/min — ABNORMAL LOW (ref >60)
Glucose, Bld: 97 mg/dL (ref 70–99)
Potassium: 4.3 mmol/L (ref 3.5–5.1)
Sodium: 140 mmol/L (ref 135–145)

## 2023-01-29 ENCOUNTER — Other Ambulatory Visit: Payer: Self-pay | Admitting: Hematology

## 2023-01-29 DIAGNOSIS — Z0000 Encounter for general adult medical examination without abnormal findings: Secondary | ICD-10-CM

## 2023-02-06 ENCOUNTER — Ambulatory Visit
Admission: RE | Admit: 2023-02-06 | Discharge: 2023-02-06 | Disposition: A | Discharge status: Home / Self Care | Payer: Medicare Other | Source: Ambulatory Visit | Attending: Hematology | Admitting: Hematology

## 2023-02-06 DIAGNOSIS — Z0000 Encounter for general adult medical examination without abnormal findings: Secondary | ICD-10-CM

## 2023-02-09 ENCOUNTER — Ambulatory Visit: Payer: Medicare Other | Admitting: Interventional Cardiology

## 2023-03-03 NOTE — Progress Notes (Unsigned)
Office Visit    Patient Name: [name] Date of Encounter: 03/04/2023  PCP:  Delorse Lek, MD   [facility]  Cardiologist:  None  Advanced Practice Provider:  No care team member to display Electrophysiologist:  None    HPI    [name] is a 72 y.o. 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  She was evaluated by cardiology at that time and diuresed with IV Lasix.  Was noted to be in rate controlled atrial fibrillation, persistent.  Echo 3/23 showed hyperdynamic LVEF 65 to 70%, D-shaped LV consistent with RV volume overload, grade 2 DD with moderate LVH, mildly reduced RV and severely elevated RVSP, 58 mmHg, moderate MR with severe BAE and severe TR.  After diuresis we will switch back to oral Lasix plus Jardiance.  Continue on Xarelto and metoprolol for atrial fibrillation.  Referred to [facility] clinic.  Weight 279 at this point.  Full compliance with CPAP.  Full compliance with Xarelto.  Denied history of PE, DVT.  No family history of HF.  Denied chest pain.  Non-smoker.  Positive history of asthma but with no history of COPD.  No known history of connective tissue disorder.  RHC was arranged from [facility] clinic done 4/23 which showed markedly elevated right and left heart filling pressures with predominant V waves in the PCWP tracing.  PVR not elevated, pulmonary venous hypertension.  Lasix was increased.  She was seen postcardiac catheterization.  Lasix was stopped.  Switch to torsemide 60 mg twice daily.  Amyloid workup was arranged.  Multiple myeloma negative for M spike protein.  Immunofixation unremarkable.  She was also set up for TEE to  better assess her mitral valve.  It showed moderate MR with restriction of the posterior leaflet.  Also with moderate to severe TR, normal LVEF 60 to 65%.  Follow-up 5/23 with mild volume overload.  Spyro 12.5 mg started.  Cardiac MRI 7/23 however was not suggestive of cardiac amyloidosis.  Return to see heart failure 02/11/2022 was using CPAP.  Shortness of breath when walking 100 feet.  Uses a cane.  No chest pain.  No lightheadedness.  Does not feel tingling or numbness in her feet.  Weight down 2 pounds.  She was seen by me 07/2022,  she tells me she was in the ED with pneumonia back in December.  She has some shortness of breath and cough.  She was on antibiotics.  She feels a lot better now.  She states that her shortness of breath has cleared up some.  She had surgery on her eyes back in January and has really been taking it easy since then.  Her weight went up because she was not taking her diuretics.  Her dry weight is 244.  She states she is back on her fluid pills and started back on them this week.  She is encouraged to take her weight daily.  She was seen by the CHF clinic in July and her torsemide was increased to 60mg  BID and 40 KCL BID. No entresto due to allergy. Referred to gen surgery for a fat pad biopsy to stain for amyloid. cMRI was not suggestive of amyloidosis.  Today, she feels that she has about  the same amount of swelling.  She is keeping her legs elevated.  She is wearing compression hose today.  Seem to be a little better controlled than her last visit with me.  Weight is coming down and she is lost 10 pounds since March.  She is on a fluid restriction of 50 ounces a day.  She does drink some juice in the morning.  No significant shortness of breath.  She was doing well with her 60 mg twice daily of Demadex.   Reports no shortness of breath nor dyspnea on exertion. Reports no chest pain, pressure, or tightness. No orthopnea, PND. Reports no palpitations.    Past Medical  History    Past Medical History:  Diagnosis Date   Arthritis    Asthma    Blood transfusion    hx of last one in 1987   Breast cancer [facility]) 2013   Right Breast Cancer   Cancer of lower-inner quadrant of female breast (HCC) 07/24/2011   right breast cancer /IDC,stage IIB,er/pr=+,her2=Neg   Glaucoma    Heart murmur    History of chemotherapy    taxotere/cytoxan 6 cycles 09/22/11-01/05/12 day 2 neulasta   Hyperlipemia    Hypertension    Lymphedema    right arm wears a sleeve   MRSA (methicillin resistant staph aureus) culture positive    08/2011   Neuromuscular disorder (HCC)    peripheral neuropathy hands feet   OSA (obstructive sleep apnea)    CPAP settings- ?    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 Franky Macho follows   Past Surgical History:  Procedure Laterality Date   ABDOMINAL HYSTERECTOMY  2007   TAH/BSO   BREAST SURGERY     mastectomy RIGHT   BUBBLE STUDY  09/20/2021   Procedure: BUBBLE STUDY;  Surgeon: Laurey Morale, MD;  Location: [facility] ENDOSCOPY;  Service: Cardiovascular;;   CARDIOVERSION N/A 06/02/2017   Procedure: CARDIOVERSION;  Surgeon: Lars Masson, MD;  Location: [facility] ENDOSCOPY;  Service: Cardiovascular;  Laterality: N/A;   CARPAL TUNNEL RELEASE Right    2012   CHOLECYSTECTOMY  1987   open - Dr Wiliam Ke   COLONOSCOPY N/A 06/13/2013   Procedure: COLONOSCOPY;  Surgeon: Charna Elizabeth, MD;  Location: [facility] ENDOSCOPY;  Service: Endoscopy;  Laterality: N/A;   DILATION AND CURETTAGE OF UTERUS     x3   EYE SURGERY     laser eye surgery for glaucoma   MASTECTOMY MODIFIED RADICAL Right 08/14/11   right , ER/PR +, HER2 -   port-a-cath insertion     port-a-cath removal 1'14   PORT-A-CATH REMOVAL  06/22/2012   Procedure: MINOR REMOVAL PORT-A-CATH;  Surgeon: Currie Paris, MD;  Location: [facility];  Service: General;  Laterality: Left;   PORTACATH PLACEMENT  09/11/2011   Procedure: INSERTION PORT-A-CATH;  Surgeon: Clovis Pu. Cornett, MD;  Location: [facility] ORS;  Service: General;  Laterality: N/A;  Insert of Port   RIGHT HEART CATH N/A 08/28/2021   Procedure: RIGHT HEART CATH;  Surgeon: Laurey Morale, MD;  Location: [facility] INVASIVE CV LAB;  Service: Cardiovascular;  Laterality: N/A;   TEE WITHOUT CARDIOVERSION N/A 09/20/2021   Procedure: TRANSESOPHAGEAL ECHOCARDIOGRAM (TEE);  Surgeon: Laurey Morale, MD;  Location: [facility] ENDOSCOPY;  Service: Cardiovascular;  Laterality: N/A;   TUBAL LIGATION      Allergies  Allergies  Allergen Reactions   Trandolapril-Verapamil Hcl Er Swelling    Causes lips to swell Jolene Provost" is the name brand) Tolerates amlodipine    EKGs/Labs/Other Studies Reviewed:   The following studies were reviewed today:  CARDIAC MRI 11/2021 IMPRESSION: 1. Normal LV size and function LVEF 59%   2.  No delayed enhancement or evidence of amyloid   3. Moderate RVE/dysfunction RVEF 29% with dilated main PA 44 mm consistent with significant RV failure   4.  Severe bi atrial enlargement   5.  Mild appearing MR   6.  Mild appearing AR   7.  Dilated ascending thoracic aorta 4.0 cm  EKG:  EKG is not ordered today.   Recent Labs: 03/24/2022: ALT 10; Hemoglobin 14.0; Platelet Count 154 12/23/2022: B Natriuretic Peptide 260.7 01/13/2023: BUN 17; Creatinine, Ser 1.27; Potassium 4.3; Sodium 140  Recent Lipid Panel    Component Value Date/Time   CHOL 182 10/15/2018 1047   TRIG 66 10/15/2018 1047   HDL 64 10/15/2018 1047   CHOLHDL 2.8 10/15/2018 1047   LDLCALC 105 (H) 10/15/2018 1047    Risk Assessment/Calculations:   CHA2DS2-VASc Score = 4   This indicates a 4.8% annual risk of stroke. The patient's score is based upon: CHF History: 1 HTN History: 1 Diabetes History: 0 Stroke History: 0 Vascular Disease History: 0 Age Score: 1 Gender Score: 1     Home  Medications   Current Meds  Medication Sig   acetaminophen (TYLENOL) 325 MG tablet Take 325-650 mg by mouth every 6 (six) hours as needed (for pain.).   acetaZOLAMIDE (DIAMOX) 250 MG tablet Take 500 mg by mouth 2 (two) times daily.   albuterol (PROAIR HFA) 108 (90 Base) MCG/ACT inhaler Inhale 1-2 puffs into the lungs every 6 (six) hours as needed for wheezing or shortness of breath. For shortness of breath   azaTHIOprine (IMURAN) 50 MG tablet Take 100 mg by mouth daily.   cabergoline (DOSTINEX) 0.5 MG tablet Take 0.5 mg by mouth 2 (two) times a week. Tuesday and Thursday   chlorpheniramine-HYDROcodone (TUSSIONEX) 10-8 MG/5ML Take 5 mLs by mouth every 12 (twelve) hours as needed for cough.   Cholecalciferol (VITAMIN D3) 1000 UNITS CAPS Take 1,000 Units by mouth every other day.    dorzolamide (TRUSOPT) 2 % ophthalmic solution Place 1 drop into both eyes 2 (two) times daily.   GLUCOSAMINE HCL PO Take 1 tablet by mouth daily.   latanoprost (XALATAN) 0.005 % ophthalmic solution Place 1 drop into both eyes at bedtime.   metoprolol succinate (TOPROL-XL) 50 MG 24 hr tablet Take 1.5 tablets (75 mg total) by mouth 2 (two) times daily.   Multiple Vitamin (MULITIVITAMIN WITH MINERALS) TABS Take 1 tablet by mouth daily with breakfast.    potassium chloride SA (KLOR-CON M) 20 MEQ tablet Take 3 tablets (60 mEq total) by mouth 2 (two) times daily.   prednisoLONE acetate (PRED FORTE) 1 % ophthalmic suspension Place 1 drop into the right eye daily.   simvastatin (ZOCOR) 20 MG tablet TAKE 1 TABLET AT BEDTIME   spironolactone (ALDACTONE) 25 MG tablet Take 1 tablet (25 mg total) by mouth daily.   torsemide (DEMADEX) 20 MG tablet Take 3 tablets (60 mg total) by mouth 2 (two) times daily. (Patient taking differently: Take 60 mg by mouth daily.)   vitamin B-12 (CYANOCOBALAMIN) 500 MCG tablet Take 500 mcg by mouth daily.   [DISCONTINUED] empagliflozin (JARDIANCE) 10 MG TABS tablet Take 1 tablet (10 mg total) by mouth  daily.   [DISCONTINUED] XARELTO 20 MG TABS tablet TAKE 1 TABLET DAILY WITH SUPPER     Review of Systems      All other systems reviewed and are otherwise negative except as noted above.  Physical Exam    VS:  BP 128/78   Pulse 97   Ht 5\' 2"  (1.575 m)   Wt 248 lb (112.5 kg)   SpO2 98%   BMI 45.36 kg/m  , BMI Body mass index is 45.36 kg/m.  Wt Readings from Last 3 Encounters:  03/04/23 248 lb (112.5 kg)  12/09/22 251 lb 12.8 oz (114.2 kg)  11/18/22 250 lb 6.4 oz (113.6 kg)     GEN: Well nourished, well developed, in no acute distress. HEENT: normal. Neck: Supple, no JVD, carotid bruits, or masses. Cardiac: Irregularly irregular, no murmurs, rubs, or gallops. No clubbing, cyanosis, 1-2+ edema.  Radials/PT 2+ and equal bilaterally.  Respiratory:  Respirations regular and unlabored, clear to auscultation bilaterally. GI: Soft, nontender, nondistended. MS: No deformity or atrophy. Skin: Warm and dry, no rash. Neuro:  Strength and sensation are intact. Psych: Normal affect.  Assessment & Plan    Chronic diastolic heart failure -Continue current medications which include Jardiance 10 daily, Toprol succinate 75 mg daily, potassium replacement, Xarelto 20 mg daily, simvastatin 20 mg daily, spironolactone 25 mg daily, Demadex 60 mg in the morning and 60 in the evening.  -She is 10 pounds down from her last appointment  -Would continue to encourage daily weights and would expect it to continue to decrease with initiation of diuretics -Continue to increase activity as tolerated (she was more sedentary when she was recovering from surgery) -she has been doing some walking but she does have issues with her knees  Pulmonary hypertension -Continue diuresis  Persistent atrial fibrillation -She is in atrial fibrillation now.  Severe biatrial enlargement, AF present for years.  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  Signed, Sharlene Dory, PA-C 03/04/2023, 12:25 PM [facility]

## 2023-03-04 ENCOUNTER — Ambulatory Visit: Payer: Medicare Other | Attending: Interventional Cardiology | Admitting: Physician Assistant

## 2023-03-04 ENCOUNTER — Encounter: Payer: Self-pay | Admitting: Physician Assistant

## 2023-03-04 VITALS — BP 128/78 | HR 97 | Ht 62.0 in | Wt 248.0 lb

## 2023-03-04 DIAGNOSIS — I482 Chronic atrial fibrillation, unspecified: Secondary | ICD-10-CM | POA: Diagnosis present

## 2023-03-04 DIAGNOSIS — I340 Nonrheumatic mitral (valve) insufficiency: Secondary | ICD-10-CM | POA: Diagnosis present

## 2023-03-04 DIAGNOSIS — N183 Chronic kidney disease, stage 3 unspecified: Secondary | ICD-10-CM | POA: Insufficient documentation

## 2023-03-04 DIAGNOSIS — I272 Pulmonary hypertension, unspecified: Secondary | ICD-10-CM | POA: Insufficient documentation

## 2023-03-04 DIAGNOSIS — I10 Essential (primary) hypertension: Secondary | ICD-10-CM | POA: Insufficient documentation

## 2023-03-04 DIAGNOSIS — I071 Rheumatic tricuspid insufficiency: Secondary | ICD-10-CM | POA: Diagnosis not present

## 2023-03-04 DIAGNOSIS — I5032 Chronic diastolic (congestive) heart failure: Secondary | ICD-10-CM | POA: Insufficient documentation

## 2023-03-04 MED ORDER — RIVAROXABAN 20 MG PO TABS: 20.0000 mg | ORAL_TABLET | Freq: Every day | ORAL | 3 refills | Status: DC

## 2023-03-04 MED ORDER — EMPAGLIFLOZIN 10 MG PO TABS: 10.0000 mg | ORAL_TABLET | Freq: Every day | ORAL | 3 refills | Status: DC

## 2023-03-04 NOTE — Patient Instructions (Signed)
Medication Instructions:  Your physician recommends that you continue on your current medications as directed. Please refer to the Current Medication list given to you today. *If you need a refill on your cardiac medications before your next appointment, please call your pharmacy*   Lab Work: NONE ORDERED   Testing/Procedures: NONE ORDERED   Follow-Up: At [facility], you and your health needs are our priority.  As part of our continuing mission to provide you with exceptional heart care, we have created designated Provider Care Teams.  These Care Teams include your primary Cardiologist (physician) and Advanced Practice Providers (APPs -  Physician Assistants and Nurse Practitioners) who all work together to provide you with the care you need, when you need it.  We recommend signing up for the patient portal called "MyChart".  Sign up information is provided on this After Visit Summary.  MyChart is used to connect with patients for Virtual Visits (Telemedicine).  Patients are able to view lab/test results, encounter notes, upcoming appointments, etc.  Non-urgent messages can be sent to your provider as well.   To learn more about what you can do with MyChart, go to [email or web address].    Your next appointment:   6 month(s)  Provider:   Riley Lam, MD  [facility] CLINIC IN NOVEMBER 2024 Other Instructions

## 2023-03-20 NOTE — Progress Notes (Incomplete)
Advanced Heart Failure Clinic Note  PCP: Delorse Lek, MD  Primary Cardiologist: None HF Cardiology: Dr Shirlee Latch  HPI: 72 y.o. female w/ chronic diastolic heart failure, RV dysfunction, pulmonary HTN, persistent atrial fibrillation, MR/TR, HTN, HLD, h/o rt sided breast cancer in 2013 tx w/ mastectomy, chemo + radiation, CKD stage IIIb and peripheral neuropathy,  w/ recent admit for a/c diastolic heart failure. Evaluated by cardiology and diuresed w/ IV Lasix. Was noted to be in rate controlled afib, persistent. Echo in 3/23 showed hyperdynamic LVEF, 65-70%, D shaped LV c/w RV volume overload, Grade II DD w/ mod LVH, mildly reduced RV and severely elevated RVSP, 58 mmHg, moderate MR w/ severe BAE and severe TR.   After diuresis, was switched back to oral lasix + Jardiance. Continued on Xarelto and metoprolol for afib. Referred to [facility] clinic. D/c Wt 279 lb. Reports full compliance w/ CPAP. Fully compliant w/ Xarelto. Denies h/o PE/DVT. No family h/o HF. Denies CP. Non smoker. + h/o asthma but no h/o COPD. No known h/o connective tissue disorder.    RHC was arranged from [facility] clinic, done in 4/23.  This showed markedly elevated right and left heart filling pressures with prominent v-waves in the PCWP tracing.  PVR not elevated, pulmonary venous hypertension.  Lasix was increased.   She was seen post cath 4/23 and remained fluid overloaded. Lasix was stopped. Switched to torsemide 60 mg bid. Amyloid work up arranged. Multiple Myeloma negative for M spike protein. Immunofixation unremarkable. She was also set up for TEE to better assess her mitral valve. This showed moderate MR w/ restriction of the posterior leaflet. ERO  0.13 cm^2 by PISA, vena contract area 0.21 cm^2. Also w/ mod-severe TR, normal LVEF 60-65% + mildly D-shaped interventricular septum suggestive of  RV pressure/volume overload.  Follow up 5/23, mild volume overload with NYHA II symptoms. Spiro 12.5 started.   PYP 5/23 showed  visual and quantitative assessments (grade 2, H/CL= 1.42), concerning for cardiac amyloidosis. Cardiac MRI in 7/23, however, was not suggestive of cardiac amyloidosis: LV EF 59%, no LGE, ECV 30%, moderate RV enlargement with RVEF 29%, biatrial enlargement, mild MR. [employer] gene testing was negative for hATTR.   Repeat PYP (10/23) equivocal for cardiac amyloidosis (grade 2, H/CL 1-1.5).   Follow up 6/24, volume overloaded and torsemide increased to 60 bid. Arranged for repeat echo, and referred to General Surgery for abdominal pad pad biopsy to stain for amyloid.  F/u 7/24: Echo 12/09/22 EF 65-70%, mild concentric LVH, RV mildly reduced, LA/RA severely dilated, moderate MR/AI, TR severe. Volume overloaded. Diuretics increased. Had been struggling with compliance with her daughter being in the hospital.   Today he returns for AHF follow up. Overall feeling ***. Denies palpitations, CP, dizziness, edema, or PND/Orthopnea. *** SOB. Appetite ok. No fever or chills. Weight at home *** pounds. Taking all medications. ETOH, smoking ***    ReDs: ***%  Labs (4/23): K 3.6, creatinine 1.21, hgb 11.6 Labs (4/23): Multiple Myeloma no M-spike protein, Immunofixation negative  Labs (5/23): K 4.7, creatinine 1.28, hgb 13.2 Labs (6/23): K 4, creatinine 1.35, SPEP negative, urine immunofixation negative.  Labs (10/23): K. 3.3, creatinine 1.67 Labs (5/24): K 3.2, creatinine 1.39  Labs (7/24): K 4.3, creatinine 1.35 Labs (8/24): K 4.3, Scr 1.27  ECG (personally reviewed): none ordered today  PMH: 1. CKD stage 3 2. OSA: uses CPAP.  3. Atrial fibrillation: Chronic.  It appears that she has been in AF since at least 2018.  4.  Breast cancer on right in 2013: Taxotere/Cytoxan chemotherapy.  5. Peripheral neuropathy.  6. Chronic diastolic CHF with prominent RV failure: Echo in 3/23 with EF 65-70%, moderate LVH, D-shaped septum, mild RVE with mildly decreased systolic function, PASP 58, severe biatrial enlargement,  at least moderate MR, severe TR, mild-moderate AI, IVC dilated.  - RHC (4/23): mean RA 22, PA 69/31 mean 46, mean PCWP 30 with v-waves to 44, CI 2.82, PVR 2.6 WU.  - TEE (4/23): moderate MR w/ restriction of the posterior leaflet. ERO  0.13 cm^2 by PISA, vena contract area 0.21 cm^2. Also w/ mod-severe TR, normal LVEF 60-65% + mildly D-shaped interventricular septum suggestive of  RV pressure/volume overload. - PYP (5/23) strongly suggestive of cardiac amyloidosis, grade 2, H/CL= 1.42 - Cardiac MRI (7/23): LV EF 59%, no LGE, ECV 30%, moderate RV enlargement with RVEF 29%, biatrial enlargement, mild MR. - PYP (10/23): Equivocal with grade 2, H/CL1-1.5 - Echo 12/09/22 EF 65-70%, mild concentric LVH, RV mildly reduced, LA/RA severely dilated, moderate MR/AI, TR severe 7. CKD stage 3  Review of Systems: All systems reviewed and negative except as per HPI.   Current Outpatient Medications  Medication Sig Dispense Refill   acetaminophen (TYLENOL) 325 MG tablet Take 325-650 mg by mouth every 6 (six) hours as needed (for pain.).     acetaZOLAMIDE (DIAMOX) 250 MG tablet Take 500 mg by mouth 2 (two) times daily.     albuterol (PROAIR HFA) 108 (90 Base) MCG/ACT inhaler Inhale 1-2 puffs into the lungs every 6 (six) hours as needed for wheezing or shortness of breath. For shortness of breath 18 g 3   azaTHIOprine (IMURAN) 50 MG tablet Take 100 mg by mouth daily.     cabergoline (DOSTINEX) 0.5 MG tablet Take 0.5 mg by mouth 2 (two) times a week. Tuesday and Thursday     chlorpheniramine-HYDROcodone (TUSSIONEX) 10-8 MG/5ML Take 5 mLs by mouth every 12 (twelve) hours as needed for cough. 70 mL 0   Cholecalciferol (VITAMIN D3) 1000 UNITS CAPS Take 1,000 Units by mouth every other day.      dorzolamide (TRUSOPT) 2 % ophthalmic solution Place 1 drop into both eyes 2 (two) times daily.     empagliflozin (JARDIANCE) 10 MG TABS tablet Take 1 tablet (10 mg total) by mouth daily. 90 tablet 3   GLUCOSAMINE HCL PO Take 1  tablet by mouth daily.     latanoprost (XALATAN) 0.005 % ophthalmic solution Place 1 drop into both eyes at bedtime.     metoprolol succinate (TOPROL-XL) 50 MG 24 hr tablet Take 1.5 tablets (75 mg total) by mouth 2 (two) times daily. 270 tablet 3   Multiple Vitamin (MULITIVITAMIN WITH MINERALS) TABS Take 1 tablet by mouth daily with breakfast.      potassium chloride SA (KLOR-CON M) 20 MEQ tablet Take 3 tablets (60 mEq total) by mouth 2 (two) times daily. 180 tablet 3   prednisoLONE acetate (PRED FORTE) 1 % ophthalmic suspension Place 1 drop into the right eye daily.     rivaroxaban (XARELTO) 20 MG TABS tablet Take 1 tablet (20 mg total) by mouth daily with supper. 90 tablet 3   simvastatin (ZOCOR) 20 MG tablet TAKE 1 TABLET AT BEDTIME 90 tablet 3   spironolactone (ALDACTONE) 25 MG tablet Take 1 tablet (25 mg total) by mouth daily. 90 tablet 3   torsemide (DEMADEX) 20 MG tablet Take 3 tablets (60 mg total) by mouth 2 (two) times daily. (Patient taking differently: Take 60 mg by  mouth daily.) 150 tablet 3   vitamin B-12 (CYANOCOBALAMIN) 500 MCG tablet Take 500 mcg by mouth daily.     No current facility-administered medications for this visit.   Facility-Administered Medications Ordered in Other Visits  Medication Dose Route Frequency Provider Last Rate Last Admin   lidocaine (cardiac) 100 mg/50ml (XYLOCAINE) 20 MG/ML injection 2%    Anesthesia Intra-op Epimenio Sarin, CRNA   60 mg at 06/02/17 1343   Allergies  Allergen Reactions   Trandolapril-Verapamil Hcl Er Swelling    Causes lips to swell Jolene Provost" is the name brand) Tolerates amlodipine   Social History   Socioeconomic History   Marital status: Married    Spouse name: Not on file   Number of children: Not on file   Years of education: Not on file   Highest education level: Not on file  Occupational History   Not on file  Tobacco Use   Smoking status: Never   Smokeless tobacco: Never  Vaping Use   Vaping status: Never Used   Substance and Sexual Activity   Alcohol use: No   Drug use: No   Sexual activity: Yes    Birth control/protection: Post-menopausal, Surgical  Other Topics Concern   Not on file  Social History Narrative   Not on file   Social Determinants of Health   Financial Resource Strain: Low Risk  (12/09/2022)   Overall Financial Resource Strain (CARDIA)    Difficulty of Paying Living Expenses: Not hard at all  Food Insecurity: No Food Insecurity (12/09/2022)   Hunger Vital Sign    Worried About Running Out of Food in the Last Year: Never true    Ran Out of Food in the Last Year: Never true  Transportation Needs: No Transportation Needs (12/09/2022)   PRAPARE - Administrator, Civil Service (Medical): No    Lack of Transportation (Non-Medical): No  Physical Activity: Not on file  Stress: Not on file  Social Connections: Not on file  Intimate Partner Violence: Not on file   Family History  Problem Relation Age of Onset   Arthritis Mother    Cancer Father 81       prostate ca   Breast cancer Neg Hx    Wt Readings from Last 3 Encounters:  03/04/23 112.5 kg (248 lb)  12/09/22 114.2 kg (251 lb 12.8 oz)  11/18/22 113.6 kg (250 lb 6.4 oz)   There were no vitals taken for this visit.  PHYSICAL EXAM: General:  *** appearing.  No respiratory difficulty HEENT: normal Neck: supple. JVD *** cm. Carotids 2+ bilat; no bruits. No lymphadenopathy or thyromegaly appreciated. Cor: PMI nondisplaced. Regular rate & rhythm. No rubs, gallops or murmurs. Lungs: clear Abdomen: soft, nontender, nondistended. No hepatosplenomegaly. No bruits or masses. Good bowel sounds. Extremities: no cyanosis, clubbing, rash, edema  Neuro: alert & oriented x 3, cranial nerves grossly intact. moves all 4 extremities w/o difficulty. Affect pleasant.   ASSESSMENT & PLAN: 1. Chronic diastolic CHF with prominent RV dysfunction: Echo in 3/23 with EF 65-70%, moderate LVH, D-shaped septum, mild RVE with mildly  decreased systolic function, PASP 58, severe biatrial enlargement, at least moderate MR, severe TR, mild-moderate AI, IVC dilated. RHC in 4/23 showed markedly elevated right and left heart filling pressures with prominent v-waves in the PCWP tracing.  PVR not elevated, pulmonary venous hypertension.  LV and RV failure components with valvular heart disease.  Chronic atrial fibrillation may also be a major driver for CHF. Multiple Myeloma study  and immunofixation both normal. PYP 5/23 suggestive but not diagnostic of cardiac amyloidosis. However, cardiac MRI was NOT suggestive of cardiac amyloidosis with no LGE and no significant ECV percentage elevation.  [employer]ae gene testing was negative for hATTR.  Repeat PYP scan in 10/23 was equivocal.  Cardiac amyloidosis diagnosis still remains uncertain.  Echo 12/09/22 EF 65-70%, mild concentric LVH, RV mildly reduced, LA/RA severely dilated, moderate MR/AI, TR severe. NYHA class II-III symptoms, she does look volume overloaded ***.  - Continue torsemide 60 mg bid + 40 KCL bid.  - Continue spironolactone 25 mg daily.  - Continue empagliflozin 10 mg daily. No GU symptoms. - No Entresto (h/o angioedema w/ ACEi).  - Continue Toprol XL 75 mg bid.  - She has been referred to general surgery for abdominal fat pad biopsy to stain for amyloid. 2. Pulmonary hypertension: This appears to be pulmonary venous hypertension.  Treatment will be diuresis.  3. Permanent atrial fibrillation: Severe biatrial enlargement, AF present for several years. She is well rate-controlled. Unlikely to successfully cardiovert even with anti-arrhythmic given severe LAE and chronicity.  - Continue Xarelto. No bleeding issues. 4. OSA: Continue CPAP.  5. Mitral Regurgitation: Possible atrial functional MR with severe LAE. TEE 4/23 showed moderate MR w/ restriction of the posterior leaflet. ERO  0.13 cm^2 by PISA, vena contract area 0.21 cm^2.  - Echo 7/24 with moderate MR 5. Tricuspid  Regurgitation: Mod-severe on TEE 4/23.  Medically manage for now, may be candidate down the road for percutaneous repair.  - Echo 7/24 with severe TR 6. CKD: Stage 3. Last SCr 1.27.  - Continue empagliflozin.  Follow up in 3-4 months with Dr. Shirlee Latch. ***  Alen Bleacher, AGACNP-BC  03/20/2023

## 2023-03-23 NOTE — Assessment & Plan Note (Signed)
Right breast cancer  -diagnosed 08/05/2011 cT3NxMx stage IIB invasive ductal carcinoma  -s/p right simple mastectomy with ALND 08/14/2011 final path revealed invasive and in situ ductal carcinoma, margins negative, focal LVI and 1/15 + LNs, ER/PR + HER2 negative final pT2N1aMx stage IIB -s/p adjuvant chemo with taxotere/cytoxan q3 weeks x6 09/22/11 - 01/05/12 -s/p adjuvant radiation completed 03/2012 -s/p adjuvant anastrozole x7 years completed 03/2019 -[name] is clinically doing well.  Breast exam reveals a discrete nodule at the lateral right mastectomy scar, likely scar tissue but feels more firm.  Although recurrence 10 years from initial diagnosis is unlikely, I am referring her for an ultrasound to rule out.-Left mammo 02/09/2023 was negative, continue annually -If workup is negative, continue annual surveillance

## 2023-03-23 NOTE — Progress Notes (Unsigned)
Patient Care Team: Delorse Lek, MD as PCP - General (Family Medicine) Christell Constant, MD as Consulting Physician (Cardiology)  Clinic Day:  03/24/2023  Referring physician: Delorse Lek, MD  ASSESSMENT & PLAN:   Assessment & Plan: Breast cancer of lower-inner quadrant of right female breast [facility])  Right breast cancer  -diagnosed 08/05/2011 cT3NxMx stage IIB invasive ductal carcinoma  -s/p right simple mastectomy with ALND 08/14/2011 final path revealed invasive and in situ ductal carcinoma, margins negative, focal LVI and 1/15 + LNs, ER/PR + HER2 negative final pT2N1aMx stage IIB -s/p adjuvant chemo with taxotere/cytoxan q3 weeks x6 09/22/11 - 01/05/12 -s/p adjuvant radiation completed 03/2012 -s/p adjuvant anastrozole x7 years completed 03/2019 -Ms. [name] is clinically doing well.  Breast exam reveals a discrete nodule at the lateral right mastectomy scar, likely scar tissue but feels more firm.  Although recurrence 10 years from initial diagnosis is unlikely, I am referring her for an ultrasound to rule out.-Left mammo 02/09/2023 was negative, continue annually -If workup is negative, continue annual surveillance   Plan: Labs reviewed  -CBC showing WBC 3.9; Hgb 13.8; Hct 42.8; Plt 142; Anc 2.2 -CMP - K 4.2; glucose 93; BUN 35; Creatinine 1.5; eGFR 36; Ca 9.8; LFTs normal; t.bili 2.6. --reviewing prior labs, renal functions and t.bili levels are at her baseline. She does have known history of CHF, atrial fibrillation, and CKD stage IIIb which are monitored routinely per cardiology.  -Ca 27.29 pending    Most recent unilateral left mammogram negative on 02/09/2023. Will be due for next mammogram in 01/2024.  Labs and yearly surveillance recommended.   The patient understands the plans discussed today and is in agreement with them.  She knows to contact our office if she develops concerns prior to her next appointment.  I provided 25 minutes of face-to-face time during  this encounter and > 50% was spent counseling as documented under my assessment and plan.    Carlean Jews, NP  [facility] [address] [place] [address] Dept: [phone] Dept Fax: [phone]        CHIEF COMPLAINT:  CC: right breast cancer   Current Treatment:  annual surveillance  INTERVAL HISTORY:  [name] is here today for repeat clinical assessment. She was last seen by Clayborn Heron, NP on 03/24/2022. She had 3D screening mammogram (left) on 02/09/2023 which was negative. She denies fevers or chills. She denies pain. Her appetite is good. Her weight has been stable.  I have reviewed the past medical history, past surgical history, social history and family history with the patient and they are unchanged from previous note.  ALLERGIES:  is allergic to trandolapril-verapamil hcl er.  MEDICATIONS:  Current Outpatient Medications  Medication Sig Dispense Refill   acetaZOLAMIDE (DIAMOX) 250 MG tablet Take 500 mg by mouth 2 (two) times daily.     albuterol (PROAIR HFA) 108 (90 Base) MCG/ACT inhaler Inhale 1-2 puffs into the lungs every 6 (six) hours as needed for wheezing or shortness of breath. For shortness of breath 18 g 3   azaTHIOprine (IMURAN) 50 MG tablet Take 100 mg by mouth daily.     cabergoline (DOSTINEX) 0.5 MG tablet Take 0.5 mg by mouth 2 (two) times a week. Tuesday and Thursday     chlorpheniramine-HYDROcodone (TUSSIONEX) 10-8 MG/5ML Take 5 mLs by mouth every 12 (twelve) hours as needed for cough. 70 mL 0   Cholecalciferol (VITAMIN D3) 1000 UNITS CAPS  Take 1,000 Units by mouth every other day.      dorzolamide (TRUSOPT) 2 % ophthalmic solution Place 1 drop into both eyes 2 (two) times daily.     empagliflozin (JARDIANCE) 10 MG TABS tablet Take 1 tablet (10 mg total) by mouth daily. 90 tablet 3   GLUCOSAMINE HCL PO Take 1 tablet by mouth daily.     latanoprost (XALATAN) 0.005 % ophthalmic  solution Place 1 drop into both eyes at bedtime.     metoprolol succinate (TOPROL-XL) 50 MG 24 hr tablet Take 1.5 tablets (75 mg total) by mouth 2 (two) times daily. 270 tablet 3   Multiple Vitamin (MULITIVITAMIN WITH MINERALS) TABS Take 1 tablet by mouth daily with breakfast.      potassium chloride SA (KLOR-CON M) 20 MEQ tablet Take 3 tablets (60 mEq total) by mouth 2 (two) times daily. 180 tablet 3   prednisoLONE acetate (PRED FORTE) 1 % ophthalmic suspension Place 1 drop into the right eye daily.     rivaroxaban (XARELTO) 20 MG TABS tablet Take 1 tablet (20 mg total) by mouth daily with supper. 90 tablet 3   simvastatin (ZOCOR) 20 MG tablet TAKE 1 TABLET AT BEDTIME 90 tablet 3   spironolactone (ALDACTONE) 25 MG tablet Take 1 tablet (25 mg total) by mouth daily. 90 tablet 3   torsemide (DEMADEX) 20 MG tablet Take 3 tablets (60 mg total) by mouth 2 (two) times daily. (Patient taking differently: Take 60 mg by mouth daily.) 150 tablet 3   vitamin B-12 (CYANOCOBALAMIN) 500 MCG tablet Take 500 mcg by mouth daily.     acetaminophen (TYLENOL) 325 MG tablet Take 325-650 mg by mouth every 6 (six) hours as needed (for pain.). (Patient not taking: Reported on 03/24/2023)     No current facility-administered medications for this visit.   Facility-Administered Medications Ordered in Other Visits  Medication Dose Route Frequency Provider Last Rate Last Admin   lidocaine (cardiac) 100 mg/53ml (XYLOCAINE) 20 MG/ML injection 2%    Anesthesia Intra-op Epimenio Sarin, CRNA   60 mg at 06/02/17 1343    HISTORY OF PRESENT ILLNESS:   Oncology History  Breast cancer of lower-inner quadrant of right female breast (HCC)  07/24/2011 Initial Diagnosis   Breast cancer of lower-inner quadrant of right female breast (HCC)   02/09/2023 Mammogram   3D screening mammogram left breast IMPRESSION: No mammographic evidence of malignancy.  RECOMMENDATION: Screening mammogram in one year. (Code:[id number]-B-01Y)  BI-RADS  CATEGORY  1: Negative.         REVIEW OF SYSTEMS:   Constitutional: Denies fevers, chills or abnormal weight loss Eyes: Denies blurriness of vision Ears, nose, mouth, throat, and face: Denies mucositis or sore throat Respiratory: Denies cough, dyspnea or wheezes Cardiovascular: Denies palpitation, chest discomfort or lower extremity swelling Gastrointestinal:  Denies nausea, heartburn or change in bowel habits Skin: Denies abnormal skin rashes Lymphatics: Denies new lymphadenopathy or easy bruising Neurological:Denies numbness, tingling or new weaknesses Behavioral/Psych: Mood is stable, no new changes  All other systems were reviewed with the patient and are negative.   VITALS:   Today's Vitals   03/24/23 0900  BP: 107/68  Pulse: (!) 54  Resp: 16  Temp: (!) 97.5 F (36.4 C)  TempSrc: Oral  SpO2: 98%  Weight: 246 lb 3.2 oz (111.7 kg)  Height: 5\' 2"  (1.575 m)  PainSc: 0-No pain   Body mass index is 45.03 kg/m.   Wt Readings from Last 3 Encounters:  03/24/23 246 lb 3.2 oz (  111.7 kg)  03/04/23 248 lb (112.5 kg)  12/09/22 251 lb 12.8 oz (114.2 kg)    Body mass index is 45.03 kg/m.  Performance status (ECOG): 1 - Symptomatic but completely ambulatory  PHYSICAL EXAM:   GENERAL:alert, no distress and comfortable SKIN: skin color, texture, turgor are normal, no rashes or significant lesions EYES: normal, Conjunctiva are pink and non-injected, sclera clear OROPHARYNX:no exudate, no erythema and lips, buccal mucosa, and tongue normal  NECK: supple, thyroid normal size, non-tender, without nodularity LYMPH:  no palpable lymphadenopathy in the cervical, axillary or inguinal LUNGS: clear to auscultation and percussion with normal breathing effort HEART: irregularly irregular heart rate and rhythm. Soft, systolic murmur auscultated.  ABDOMEN:abdomen soft, non-tender and normal bowel sounds Musculoskeletal:no cyanosis of digits and no clubbing  NEURO: alert & oriented x  3 with fluent speech, no focal motor/sensory deficits BREAST: S/p right mastectomy, incision completely healed. No palpable masses or nodular densities palpated along right breast area or right axillary region. Left breast without nipple discharge or inversion.  No palpable mass or nodularity in the left breast, chest wall, or left axillary region that are noted today. Exam performed in wheelchair  LABORATORY DATA:  I have reviewed the data as listed    Component Value Date/Time   NA 140 03/24/2023 0908   NA 139 08/26/2021 1302   NA 141 02/20/2017 1015   K 4.2 03/24/2023 0908   K 3.7 02/20/2017 1015   CL 103 03/24/2023 0908   CL 100 06/21/2012 0936   CO2 29 03/24/2023 0908   CO2 29 02/20/2017 1015   GLUCOSE 93 03/24/2023 0908   GLUCOSE 92 02/20/2017 1015   GLUCOSE 114 (H) 06/21/2012 0936   BUN 35 (H) 03/24/2023 0908   BUN 19 08/26/2021 1302   BUN 17.7 02/20/2017 1015   CREATININE 1.50 (H) 03/24/2023 0908   CREATININE 0.9 02/20/2017 1015   CALCIUM 9.8 03/24/2023 0908   CALCIUM 9.7 02/20/2017 1015   PROT 7.5 03/24/2023 0908   PROT 6.5 10/15/2018 1047   PROT 7.4 02/20/2017 1015   ALBUMIN 4.3 03/24/2023 0908   ALBUMIN 4.3 10/15/2018 1047   ALBUMIN 3.9 02/20/2017 1015   AST 17 03/24/2023 0908   AST 15 02/20/2017 1015   ALT 10 03/24/2023 0908   ALT 11 02/20/2017 1015   ALKPHOS 72 03/24/2023 0908   ALKPHOS 89 02/20/2017 1015   BILITOT 2.6 (H) 03/24/2023 0908   BILITOT 1.69 (H) 02/20/2017 1015   GFRNONAA 36 (L) 03/24/2023 0908   GFRAA 52 (L) 03/28/2019 0920     Lab Results  Component Value Date   WBC 3.9 (L) 03/24/2023   NEUTROABS 2.2 03/24/2023   HGB 13.8 03/24/2023   HCT 42.8 03/24/2023   MCV 95.7 03/24/2023   PLT 142 (L) 03/24/2023

## 2023-03-24 ENCOUNTER — Encounter: Payer: Self-pay | Admitting: Nurse Practitioner

## 2023-03-24 ENCOUNTER — Other Ambulatory Visit: Payer: Self-pay

## 2023-03-24 ENCOUNTER — Other Ambulatory Visit: Payer: Self-pay | Admitting: Nurse Practitioner

## 2023-03-24 ENCOUNTER — Inpatient Hospital Stay (HOSPITAL_BASED_OUTPATIENT_CLINIC_OR_DEPARTMENT_OTHER): Payer: Medicare Other | Admitting: Nurse Practitioner

## 2023-03-24 ENCOUNTER — Inpatient Hospital Stay: Payer: Medicare Other | Attending: Nurse Practitioner

## 2023-03-24 VITALS — BP 107/68 | HR 54 | Temp 97.5°F | Resp 16 | Ht 62.0 in | Wt 246.2 lb

## 2023-03-24 DIAGNOSIS — I509 Heart failure, unspecified: Secondary | ICD-10-CM | POA: Insufficient documentation

## 2023-03-24 DIAGNOSIS — Z853 Personal history of malignant neoplasm of breast: Secondary | ICD-10-CM | POA: Diagnosis present

## 2023-03-24 DIAGNOSIS — Z79631 Long term (current) use of antimetabolite agent: Secondary | ICD-10-CM | POA: Diagnosis not present

## 2023-03-24 DIAGNOSIS — Z7984 Long term (current) use of oral hypoglycemic drugs: Secondary | ICD-10-CM | POA: Diagnosis not present

## 2023-03-24 DIAGNOSIS — C50311 Malignant neoplasm of lower-inner quadrant of right female breast: Secondary | ICD-10-CM

## 2023-03-24 DIAGNOSIS — Z1231 Encounter for screening mammogram for malignant neoplasm of breast: Secondary | ICD-10-CM

## 2023-03-24 DIAGNOSIS — Z170 Estrogen receptor positive status [ER+]: Secondary | ICD-10-CM | POA: Diagnosis not present

## 2023-03-24 DIAGNOSIS — Z7901 Long term (current) use of anticoagulants: Secondary | ICD-10-CM | POA: Insufficient documentation

## 2023-03-24 DIAGNOSIS — Z79624 Long term (current) use of inhibitors of nucleotide synthesis: Secondary | ICD-10-CM | POA: Diagnosis not present

## 2023-03-24 DIAGNOSIS — I4819 Other persistent atrial fibrillation: Secondary | ICD-10-CM | POA: Insufficient documentation

## 2023-03-24 DIAGNOSIS — I110 Hypertensive heart disease with heart failure: Secondary | ICD-10-CM | POA: Diagnosis not present

## 2023-03-24 DIAGNOSIS — G4733 Obstructive sleep apnea (adult) (pediatric): Secondary | ICD-10-CM | POA: Insufficient documentation

## 2023-03-24 DIAGNOSIS — Z79899 Other long term (current) drug therapy: Secondary | ICD-10-CM | POA: Insufficient documentation

## 2023-03-24 LAB — CMP (CANCER CENTER ONLY)
ALT: 10 U/L (ref 0–44)
AST: 17 U/L (ref 15–41)
Albumin: 4.3 g/dL (ref 3.5–5.0)
Alkaline Phosphatase: 72 U/L (ref 38–126)
Anion gap: 8 (ref 5–15)
BUN: 35 mg/dL — ABNORMAL HIGH (ref 8–23)
CO2: 29 mmol/L (ref 22–32)
Calcium: 9.8 mg/dL (ref 8.9–10.3)
Chloride: 103 mmol/L (ref 98–111)
Creatinine: 1.5 mg/dL — ABNORMAL HIGH (ref 0.44–1.00)
GFR, Estimated: 36 mL/min — ABNORMAL LOW (ref >60)
Glucose, Bld: 93 mg/dL (ref 70–99)
Potassium: 4.2 mmol/L (ref 3.5–5.1)
Sodium: 140 mmol/L (ref 135–145)
Total Bilirubin: 2.6 mg/dL — ABNORMAL HIGH (ref 0.3–1.2)
Total Protein: 7.5 g/dL (ref 6.5–8.1)

## 2023-03-24 LAB — CBC WITH DIFFERENTIAL (CANCER CENTER ONLY)
Abs Immature Granulocytes: 0.02 10*3/uL (ref 0.00–0.07)
Basophils Absolute: 0 10*3/uL (ref 0.0–0.1)
Basophils Relative: 1 %
Eosinophils Absolute: 0.2 10*3/uL (ref 0.0–0.5)
Eosinophils Relative: 4 %
HCT: 42.8 % (ref 36.0–46.0)
Hemoglobin: 13.8 g/dL (ref 12.0–15.0)
Immature Granulocytes: 1 %
Lymphocytes Relative: 25 %
Lymphs Abs: 1 10*3/uL (ref 0.7–4.0)
MCH: 30.9 pg (ref 26.0–34.0)
MCHC: 32.2 g/dL (ref 30.0–36.0)
MCV: 95.7 fL (ref 80.0–100.0)
Monocytes Absolute: 0.5 10*3/uL (ref 0.1–1.0)
Monocytes Relative: 14 %
Neutro Abs: 2.2 10*3/uL (ref 1.7–7.7)
Neutrophils Relative %: 55 %
Platelet Count: 142 10*3/uL — ABNORMAL LOW (ref 150–400)
RBC: 4.47 MIL/uL (ref 3.87–5.11)
RDW: 13.9 % (ref 11.5–15.5)
WBC Count: 3.9 10*3/uL — ABNORMAL LOW (ref 4.0–10.5)
nRBC: 0 % (ref 0.0–0.2)

## 2023-03-25 LAB — CANCER ANTIGEN 27.29: CA 27.29: 10.9 U/mL (ref 0.0–38.6)

## 2023-03-30 ENCOUNTER — Ambulatory Visit: Payer: Medicare Other | Admitting: Nurse Practitioner

## 2023-03-30 ENCOUNTER — Other Ambulatory Visit: Payer: Medicare Other

## 2023-04-02 ENCOUNTER — Telehealth (HOSPITAL_COMMUNITY): Payer: Self-pay | Admitting: *Deleted

## 2023-04-02 ENCOUNTER — Encounter (HOSPITAL_COMMUNITY): Payer: Self-pay

## 2023-04-02 ENCOUNTER — Telehealth (HOSPITAL_COMMUNITY): Payer: Self-pay

## 2023-04-02 ENCOUNTER — Ambulatory Visit (HOSPITAL_COMMUNITY)
Admission: RE | Admit: 2023-04-02 | Discharge: 2023-04-02 | Disposition: A | Discharge status: Home / Self Care | Payer: Medicare Other | Source: Ambulatory Visit | Attending: Internal Medicine | Admitting: Internal Medicine

## 2023-04-02 VITALS — BP 104/70 | HR 75 | Wt 248.6 lb

## 2023-04-02 DIAGNOSIS — I272 Pulmonary hypertension, unspecified: Secondary | ICD-10-CM | POA: Insufficient documentation

## 2023-04-02 DIAGNOSIS — N1832 Chronic kidney disease, stage 3b: Secondary | ICD-10-CM | POA: Diagnosis not present

## 2023-04-02 DIAGNOSIS — Z79899 Other long term (current) drug therapy: Secondary | ICD-10-CM | POA: Diagnosis not present

## 2023-04-02 DIAGNOSIS — G4733 Obstructive sleep apnea (adult) (pediatric): Secondary | ICD-10-CM | POA: Diagnosis not present

## 2023-04-02 DIAGNOSIS — I4821 Permanent atrial fibrillation: Secondary | ICD-10-CM | POA: Diagnosis not present

## 2023-04-02 DIAGNOSIS — M7989 Other specified soft tissue disorders: Secondary | ICD-10-CM | POA: Insufficient documentation

## 2023-04-02 DIAGNOSIS — I130 Hypertensive heart and chronic kidney disease with heart failure and stage 1 through stage 4 chronic kidney disease, or unspecified chronic kidney disease: Secondary | ICD-10-CM | POA: Diagnosis not present

## 2023-04-02 DIAGNOSIS — E785 Hyperlipidemia, unspecified: Secondary | ICD-10-CM | POA: Insufficient documentation

## 2023-04-02 DIAGNOSIS — Z7901 Long term (current) use of anticoagulants: Secondary | ICD-10-CM | POA: Diagnosis not present

## 2023-04-02 DIAGNOSIS — I5032 Chronic diastolic (congestive) heart failure: Secondary | ICD-10-CM | POA: Insufficient documentation

## 2023-04-02 DIAGNOSIS — I482 Chronic atrial fibrillation, unspecified: Secondary | ICD-10-CM | POA: Diagnosis not present

## 2023-04-02 DIAGNOSIS — N183 Chronic kidney disease, stage 3 unspecified: Secondary | ICD-10-CM

## 2023-04-02 DIAGNOSIS — J45909 Unspecified asthma, uncomplicated: Secondary | ICD-10-CM | POA: Insufficient documentation

## 2023-04-02 DIAGNOSIS — I071 Rheumatic tricuspid insufficiency: Secondary | ICD-10-CM

## 2023-04-02 DIAGNOSIS — Z7984 Long term (current) use of oral hypoglycemic drugs: Secondary | ICD-10-CM | POA: Diagnosis not present

## 2023-04-02 DIAGNOSIS — I081 Rheumatic disorders of both mitral and tricuspid valves: Secondary | ICD-10-CM | POA: Insufficient documentation

## 2023-04-02 DIAGNOSIS — I340 Nonrheumatic mitral (valve) insufficiency: Secondary | ICD-10-CM

## 2023-04-02 LAB — CBC
HCT: 41 % (ref 36.0–46.0)
Hemoglobin: 13.2 g/dL (ref 12.0–15.0)
MCH: 30.5 pg (ref 26.0–34.0)
MCHC: 32.2 g/dL (ref 30.0–36.0)
MCV: 94.7 fL (ref 80.0–100.0)
Platelets: 154 10*3/uL (ref 150–400)
RBC: 4.33 MIL/uL (ref 3.87–5.11)
RDW: 14.6 % (ref 11.5–15.5)
WBC: 3.6 10*3/uL — ABNORMAL LOW (ref 4.0–10.5)
nRBC: 0 % (ref 0.0–0.2)

## 2023-04-02 LAB — BASIC METABOLIC PANEL
Anion gap: 10 (ref 5–15)
BUN: 17 mg/dL (ref 8–23)
CO2: 21 mmol/L — ABNORMAL LOW (ref 22–32)
Calcium: 9.6 mg/dL (ref 8.9–10.3)
Chloride: 107 mmol/L (ref 98–111)
Creatinine, Ser: 1.35 mg/dL — ABNORMAL HIGH (ref 0.44–1.00)
GFR, Estimated: 41 mL/min — ABNORMAL LOW (ref >60)
Glucose, Bld: 94 mg/dL (ref 70–99)
Potassium: 5.6 mmol/L — ABNORMAL HIGH (ref 3.5–5.1)
Sodium: 138 mmol/L (ref 135–145)

## 2023-04-02 LAB — BRAIN NATRIURETIC PEPTIDE: B Natriuretic Peptide: 218.3 pg/mL — ABNORMAL HIGH (ref 0.0–100.0)

## 2023-04-02 MED ORDER — POTASSIUM CHLORIDE CRYS ER 20 MEQ PO TBCR: 40.0000 meq | EXTENDED_RELEASE_TABLET | Freq: Every day | ORAL | 3 refills | Status: DC

## 2023-04-02 MED ORDER — TORSEMIDE 20 MG PO TABS: 60.0000 mg | ORAL_TABLET | Freq: Every day | ORAL | 5 refills | Status: DC

## 2023-04-02 NOTE — Telephone Encounter (Signed)
Per lab: bmet contamination with edta, needs to be recollected  Pt is aware and will return tomorrow 11/8 for repeat bmet, Brynda Peon, NP aware

## 2023-04-02 NOTE — Telephone Encounter (Addendum)
Spoke with husband and asked him to get Mrs Overton Mam to ask for a sample of Lokelma at her appointment tomorrow  ----- Message from Alen Bleacher sent at 04/02/2023 12:00 PM EST ----- K 5.6 give 10 mg lokelma x1. No KDUR today, decreased KDUR dose today in clinic. Has BMET tomorrow.

## 2023-04-02 NOTE — Addendum Note (Signed)
Encounter addended by: Faythe Casa, CMA on: 04/02/2023 9:44 AM  Actions taken: Flowsheet accepted, Clinical Note Signed, Charge Capture section accepted

## 2023-04-02 NOTE — Addendum Note (Signed)
Encounter addended by: Alen Bleacher, NP on: 04/02/2023 9:45 AM  Actions taken: Clinical Note Signed

## 2023-04-02 NOTE — Addendum Note (Signed)
Encounter addended by: Alen Bleacher, NP on: 04/02/2023 2:58 PM  Actions taken: Problem List reviewed, Medication List reviewed, Allergies reviewed

## 2023-04-02 NOTE — Progress Notes (Signed)
ReDS Vest / Clip - 04/02/23 0900       ReDS Vest / Clip   Station Marker B    Ruler Value 33.5    ReDS Value Range Low volume    ReDS Actual Value 29

## 2023-04-02 NOTE — Patient Instructions (Signed)
RedsClip done today.  Labs done today. We will contact you only if your labs are abnormal.  DECREASE Torsemide to 60mg  (3 tablets) by mouth daily.   DECREASE Potassium (2 tablets) by mouth daily.   No other medication changes were made. Please continue all current medications as prescribed.  Your physician recommends that you schedule a follow-up appointment in: 4 months. Please contact our office in December 2025to schedule a February 2025 appointment.   If you have any questions or concerns before your next appointment please send [name] a message through [place] or call our office at [phone].    TO LEAVE A MESSAGE FOR THE NURSE SELECT OPTION 2, PLEASE LEAVE A MESSAGE INCLUDING: YOUR NAME DATE OF BIRTH CALL BACK NUMBER REASON FOR CALL**this is important as we prioritize the call backs  YOU WILL RECEIVE A CALL BACK THE SAME DAY AS LONG AS YOU CALL BEFORE 4:00 PM   Do the following things EVERYDAY: Weigh yourself in the morning before breakfast. Write it down and keep it in a log. Take your medicines as prescribed Eat low salt foods--Limit salt (sodium) to 2000 mg per day.  Stay as active as you can everyday Limit all fluids for the day to less than 2 liters   At the [facility], you and your health needs are our priority. As part of our continuing mission to provide you with exceptional heart care, we have created designated Provider Care Teams. These Care Teams include your primary Cardiologist (physician) and Advanced Practice Providers (APPs- Physician Assistants and Nurse Practitioners) who all work together to provide you with the care you need, when you need it.   You may see any of the following providers on your designated Care Team at your next follow up: Dr Arvilla Meres Dr Marca Ancona Dr. Marcos Eke, NP Robbie Lis, [place] [facility] [place], [place] Brynda Peon, NP Karle Plumber, PharmD   Please be sure to  bring in all your medications bottles to every appointment.    Thank you for choosing [facility]

## 2023-04-03 ENCOUNTER — Telehealth (HOSPITAL_COMMUNITY): Payer: Self-pay

## 2023-04-03 ENCOUNTER — Ambulatory Visit (HOSPITAL_COMMUNITY)
Admission: RE | Admit: 2023-04-03 | Discharge: 2023-04-03 | Disposition: A | Discharge status: Home / Self Care | Payer: Medicare Other | Source: Ambulatory Visit | Attending: Cardiology | Admitting: Cardiology

## 2023-04-03 DIAGNOSIS — I5032 Chronic diastolic (congestive) heart failure: Secondary | ICD-10-CM | POA: Diagnosis present

## 2023-04-03 LAB — BASIC METABOLIC PANEL
Anion gap: 8 (ref 5–15)
BUN: 17 mg/dL (ref 8–23)
CO2: 25 mmol/L (ref 22–32)
Calcium: 8.8 mg/dL — ABNORMAL LOW (ref 8.9–10.3)
Chloride: 106 mmol/L (ref 98–111)
Creatinine, Ser: 1.35 mg/dL — ABNORMAL HIGH (ref 0.44–1.00)
GFR, Estimated: 41 mL/min — ABNORMAL LOW (ref >60)
Glucose, Bld: 88 mg/dL (ref 70–99)
Potassium: 4.4 mmol/L (ref 3.5–5.1)
Sodium: 139 mmol/L (ref 135–145)

## 2023-04-03 NOTE — Telephone Encounter (Signed)
-----   Message from [name] sent at 04/03/2023 10:03 AM EST ----- Labs stable. If she took the [facility] from clinic today please tell her not to take it. Instruct her to take daily 40 mEq KDUR starting tomorrow.

## 2023-04-03 NOTE — Telephone Encounter (Signed)
  Baird Cancer, RN 04/03/2023 10:51 AM EST Back to Top    Spoke with patient regarding the following results. Patient made aware and patient verbalized understanding.   Patient will start the potassium daily starting tomorrow.

## 2023-04-10 ENCOUNTER — Other Ambulatory Visit: Payer: Self-pay | Admitting: Radiology

## 2023-04-10 DIAGNOSIS — E8581 Light chain (AL) amyloidosis: Secondary | ICD-10-CM

## 2023-04-14 ENCOUNTER — Ambulatory Visit (HOSPITAL_COMMUNITY)
Admission: RE | Admit: 2023-04-14 | Discharge: 2023-04-14 | Disposition: A | Discharge status: Home / Self Care | Payer: Medicare Other | Source: Ambulatory Visit | Attending: Cardiology | Admitting: Cardiology

## 2023-04-14 DIAGNOSIS — I5032 Chronic diastolic (congestive) heart failure: Secondary | ICD-10-CM | POA: Diagnosis present

## 2023-04-14 DIAGNOSIS — E859 Amyloidosis, unspecified: Secondary | ICD-10-CM | POA: Diagnosis not present

## 2023-04-14 MED ORDER — LIDOCAINE HCL (PF) 1 % IJ SOLN
10.0000 mL | Freq: Once | INTRAMUSCULAR | Status: AC
  Administered 2023-04-14: 10 mL via INTRADERMAL

## 2023-04-14 NOTE — Procedures (Signed)
Vascular and Interventional Radiology Procedure Note  Patient: [name] DOB: 1950/12/28 Medical Record Number: [id number] Note Date/Time: 04/14/23 1:11 PM   Performing Physician: Roanna Banning, MD Assistant(s): None  Diagnosis: Q amyloidosis   Procedure: ABDOMINAL FAT PAD BIOPSY  Anesthesia: Local Anesthetic Complications: None Estimated Blood Loss: Minimal Specimens: Sent for Pathology  Findings:  Successful Ultrasound-guided biopsy of abdominal fat pad. A total of 4 samples were obtained. Hemostasis of the tract was achieved using Manual Pressure.  Plan: Bed rest for 0 hours.  See detailed procedure note with images in PACS. The patient tolerated the procedure well without incident or complication and was returned to Recovery in stable condition.    Roanna Banning, MD [facility] Inter[facility] Radiology   Pager. [phone] Clinic. [phone]

## 2023-04-17 LAB — SURGICAL PATHOLOGY

## 2023-06-29 ENCOUNTER — Telehealth: Payer: Self-pay | Admitting: Adult Health

## 2023-06-29 DIAGNOSIS — G4733 Obstructive sleep apnea (adult) (pediatric): Secondary | ICD-10-CM

## 2023-06-29 NOTE — Telephone Encounter (Signed)
Spoke to patient. She is requesting order for cpap supplies to be sent to [employer].  TP, please advise if okay to order. Thanks

## 2023-06-29 NOTE — Telephone Encounter (Signed)
Patient needs new cpap supplies; tubing,mask, filter etc. Patient uses [employer].

## 2023-06-29 NOTE — Telephone Encounter (Signed)
Supplies has been ordered.  Pt is aware and voiced her understanding.  Nothing further needed.

## 2023-06-29 NOTE — Telephone Encounter (Signed)
 yes

## 2023-08-03 ENCOUNTER — Telehealth (HOSPITAL_COMMUNITY): Payer: Self-pay

## 2023-08-03 NOTE — Telephone Encounter (Signed)
 Called and left patient a voice message to confirm/remind patient of their appointment at the [facility] on 08/04/23.   And reminded to bring in all medications and/or complete list.

## 2023-08-04 ENCOUNTER — Encounter (HOSPITAL_COMMUNITY): Payer: Self-pay

## 2023-08-04 ENCOUNTER — Ambulatory Visit (HOSPITAL_COMMUNITY)
Admission: RE | Admit: 2023-08-04 | Discharge: 2023-08-04 | Disposition: A | Discharge status: Home / Self Care | Payer: TRICARE For Life (TFL) | Source: Ambulatory Visit | Attending: Family Medicine | Admitting: Family Medicine

## 2023-08-04 VITALS — BP 102/68 | HR 71 | Wt 240.8 lb

## 2023-08-04 DIAGNOSIS — I4821 Permanent atrial fibrillation: Secondary | ICD-10-CM | POA: Insufficient documentation

## 2023-08-04 DIAGNOSIS — M179 Osteoarthritis of knee, unspecified: Secondary | ICD-10-CM | POA: Insufficient documentation

## 2023-08-04 DIAGNOSIS — Z79899 Other long term (current) drug therapy: Secondary | ICD-10-CM | POA: Insufficient documentation

## 2023-08-04 DIAGNOSIS — Z7901 Long term (current) use of anticoagulants: Secondary | ICD-10-CM | POA: Insufficient documentation

## 2023-08-04 DIAGNOSIS — I340 Nonrheumatic mitral (valve) insufficiency: Secondary | ICD-10-CM

## 2023-08-04 DIAGNOSIS — J45909 Unspecified asthma, uncomplicated: Secondary | ICD-10-CM | POA: Diagnosis not present

## 2023-08-04 DIAGNOSIS — Z7984 Long term (current) use of oral hypoglycemic drugs: Secondary | ICD-10-CM | POA: Diagnosis not present

## 2023-08-04 DIAGNOSIS — I272 Pulmonary hypertension, unspecified: Secondary | ICD-10-CM

## 2023-08-04 DIAGNOSIS — E785 Hyperlipidemia, unspecified: Secondary | ICD-10-CM | POA: Insufficient documentation

## 2023-08-04 DIAGNOSIS — I482 Chronic atrial fibrillation, unspecified: Secondary | ICD-10-CM

## 2023-08-04 DIAGNOSIS — I5032 Chronic diastolic (congestive) heart failure: Secondary | ICD-10-CM

## 2023-08-04 DIAGNOSIS — I071 Rheumatic tricuspid insufficiency: Secondary | ICD-10-CM

## 2023-08-04 DIAGNOSIS — G4733 Obstructive sleep apnea (adult) (pediatric): Secondary | ICD-10-CM | POA: Diagnosis not present

## 2023-08-04 DIAGNOSIS — N1832 Chronic kidney disease, stage 3b: Secondary | ICD-10-CM | POA: Diagnosis not present

## 2023-08-04 DIAGNOSIS — I130 Hypertensive heart and chronic kidney disease with heart failure and stage 1 through stage 4 chronic kidney disease, or unspecified chronic kidney disease: Secondary | ICD-10-CM | POA: Diagnosis not present

## 2023-08-04 DIAGNOSIS — N183 Chronic kidney disease, stage 3 unspecified: Secondary | ICD-10-CM

## 2023-08-04 DIAGNOSIS — I081 Rheumatic disorders of both mitral and tricuspid valves: Secondary | ICD-10-CM | POA: Diagnosis not present

## 2023-08-04 LAB — BASIC METABOLIC PANEL
Anion gap: 10 (ref 5–15)
BUN: 18 mg/dL (ref 8–23)
CO2: 28 mmol/L (ref 22–32)
Calcium: 9.2 mg/dL (ref 8.9–10.3)
Chloride: 102 mmol/L (ref 98–111)
Creatinine, Ser: 1.32 mg/dL — ABNORMAL HIGH (ref 0.44–1.00)
GFR, Estimated: 42 mL/min — ABNORMAL LOW (ref >60)
Glucose, Bld: 101 mg/dL — ABNORMAL HIGH (ref 70–99)
Potassium: 4 mmol/L (ref 3.5–5.1)
Sodium: 140 mmol/L (ref 135–145)

## 2023-08-04 LAB — CBC
HCT: 45.8 % (ref 36.0–46.0)
Hemoglobin: 15 g/dL (ref 12.0–15.0)
MCH: 31.4 pg (ref 26.0–34.0)
MCHC: 32.8 g/dL (ref 30.0–36.0)
MCV: 95.8 fL (ref 80.0–100.0)
Platelets: 137 10*3/uL — ABNORMAL LOW (ref 150–400)
RBC: 4.78 MIL/uL (ref 3.87–5.11)
RDW: 14 % (ref 11.5–15.5)
WBC: 3.9 10*3/uL — ABNORMAL LOW (ref 4.0–10.5)
nRBC: 0 % (ref 0.0–0.2)

## 2023-08-04 LAB — BRAIN NATRIURETIC PEPTIDE: B Natriuretic Peptide: 185.7 pg/mL — ABNORMAL HIGH (ref 0.0–100.0)

## 2023-08-04 NOTE — Progress Notes (Signed)
 [facility] Note  PCP: Delorse Lek, MD  Primary Cardiologist: None HF Cardiology: Dr Shirlee Latch  HPI: 73 y.o. female w/ chronic diastolic heart failure, RV dysfunction, pulmonary HTN, persistent atrial fibrillation, MR/TR, HTN, HLD, h/o rt sided breast cancer in 2013 tx w/ mastectomy, chemo + radiation, CKD stage IIIb and peripheral neuropathy,  w/ recent admit for a/c diastolic heart failure. Evaluated by cardiology and diuresed w/ IV Lasix. Was noted to be in rate controlled afib, persistent. Echo in 3/23 showed hyperdynamic LVEF, 65-70%, D shaped LV c/w RV volume overload, Grade II DD w/ mod LVH, mildly reduced RV and severely elevated RVSP, 58 mmHg, moderate MR w/ severe BAE and severe TR.   After diuresis, was switched back to oral lasix + Jardiance. Continued on Xarelto and metoprolol for afib. Referred to [facility] clinic. D/c Wt 279 lb. Reports full compliance w/ CPAP. Fully compliant w/ Xarelto. Denies h/o PE/DVT. No family h/o HF. Denies CP. Non smoker. + h/o asthma but no h/o COPD. No known h/o connective tissue disorder.    RHC was arranged from [facility] clinic, done in 4/23.  This showed markedly elevated right and left heart filling pressures with prominent v-waves in the PCWP tracing.  PVR not elevated, pulmonary venous hypertension.  Lasix was increased.   She was seen post cath 4/23 and remained fluid overloaded. Lasix was stopped. Switched to torsemide 60 mg bid. Amyloid work up arranged. Multiple Myeloma negative for M spike protein. Immunofixation unremarkable. She was also set up for TEE to better assess her mitral valve. This showed moderate MR w/ restriction of the posterior leaflet. ERO  0.13 cm^2 by PISA, vena contract area 0.21 cm^2. Also w/ mod-severe TR, normal LVEF 60-65% + mildly D-shaped interventricular septum suggestive of  RV pressure/volume overload.  Follow up 5/23, mild volume overload with NYHA II symptoms. Spiro 12.5 started.   PYP 5/23 showed  visual and quantitative assessments (grade 2, H/CL= 1.42), concerning for cardiac amyloidosis. Cardiac MRI in 7/23, however, was not suggestive of cardiac amyloidosis: LV EF 59%, no LGE, ECV 30%, moderate RV enlargement with RVEF 29%, biatrial enlargement, mild MR. [employer] gene testing was negative for hATTR.   Repeat PYP (10/23) equivocal for cardiac amyloidosis (grade 2, H/CL 1-1.5).   Follow up 6/24, volume overloaded and torsemide increased to 60 bid. Arranged for repeat echo, and referred to General Surgery for abdominal pad pad biopsy to stain for amyloid.  Echo 7/24 showed EF 65-70%, mild concentric LVH, RV mildly reduced, LA/RA severely dilated, moderate MR/AI, TR severe.   Abd fat pad bx 11/24 showed Congo red stain negative for amyloid.   Today she returns for HF follow up. Overall feeling fine. She is not SOB walking on flat ground, uses a cane due to knee OA. Denies palpitations, abnormal bleeding, CP, dizziness, edema, or PND/Orthopnea. Appetite ok. No fever or chills. Weight at home 236 pounds. Taking all medications. Wears CPAP. Daughter's stomach cancer has returned and she is at [facility] hospital, too weak to undergo chemo. Toy has been spending a lot of time at hospital & she occasionally misses doses of her torsemide.  Labs (4/23): Multiple Myeloma no M-spike protein, Immunofixation negative  Labs (6/23): K 4, creatinine 1.35, SPEP negative, urine immunofixation negative.  Labs (5/24): K 3.2, creatinine 1.39  Labs (7/24): K 4.3, creatinine 1.35 Labs (8/24): K 4.3, creatinine 1.27 Labs (11/24): K 4.5, creatinine 1.28, LDL 119  ECG (personally reviewed): none ordered today  PMH: 1. CKD  stage 3 2. OSA: uses CPAP.  3. Atrial fibrillation: Chronic.  It appears that she has been in AF since at least 2018.  4. Breast cancer on right in 2013: Taxotere/Cytoxan chemotherapy.  5. Peripheral neuropathy.  6. Chronic diastolic CHF with prominent RV failure: Echo in 3/23 with EF 65-70%,  moderate LVH, D-shaped septum, mild RVE with mildly decreased systolic function, PASP 58, severe biatrial enlargement, at least moderate MR, severe TR, mild-moderate AI, IVC dilated.  - RHC (4/23): mean RA 22, PA 69/31 mean 46, mean PCWP 30 with v-waves to 44, CI 2.82, PVR 2.6 WU.  - TEE (4/23): moderate MR w/ restriction of the posterior leaflet. ERO  0.13 cm^2 by PISA, vena contract area 0.21 cm^2. Also w/ mod-severe TR, normal LVEF 60-65% + mildly D-shaped interventricular septum suggestive of  RV pressure/volume overload. - PYP (5/23) strongly suggestive of cardiac amyloidosis, grade 2, H/CL= 1.42 - Cardiac MRI (7/23): LV EF 59%, no LGE, ECV 30%, moderate RV enlargement with RVEF 29%, biatrial enlargement, mild MR. - PYP (10/23): Equivocal with grade 2, H/CL1-1.5 - Echo (7/24): EF 65-70%, mild concentric LVH, RV mildly reduced, LA/RA severely dilated, moderate MR/AI, TR severe - Abdominal fat pad bx 11/24 Congo red stain negative for AL amyloid 7. CKD stage 3  Review of Systems: All systems reviewed and negative except as per HPI.   Current Outpatient Medications  Medication Sig Dispense Refill   acetaminophen (TYLENOL) 325 MG tablet Take 325-650 mg by mouth every 6 (six) hours as needed (for pain.).     acetaZOLAMIDE (DIAMOX) 250 MG tablet Take 500 mg by mouth 2 (two) times daily.     albuterol (PROAIR HFA) 108 (90 Base) MCG/ACT inhaler Inhale 1-2 puffs into the lungs every 6 (six) hours as needed for wheezing or shortness of breath. For shortness of breath 18 g 3   azaTHIOprine (IMURAN) 50 MG tablet Take 100 mg by mouth daily.     cabergoline (DOSTINEX) 0.5 MG tablet Take 0.5 mg by mouth 2 (two) times a week. Tuesday and Thursday     chlorpheniramine-HYDROcodone (TUSSIONEX) 10-8 MG/5ML Take 5 mLs by mouth every 12 (twelve) hours as needed for cough. 70 mL 0   Cholecalciferol (VITAMIN D3) 1000 UNITS CAPS Take 1,000 Units by mouth every other day.      dorzolamide (TRUSOPT) 2 % ophthalmic  solution Place 1 drop into both eyes 2 (two) times daily.     empagliflozin (JARDIANCE) 10 MG TABS tablet Take 1 tablet (10 mg total) by mouth daily. 90 tablet 3   GLUCOSAMINE HCL PO Take 15 mg by mouth daily.     latanoprost (XALATAN) 0.005 % ophthalmic solution Place 1 drop into both eyes at bedtime.     metoprolol succinate (TOPROL-XL) 50 MG 24 hr tablet Take 1.5 tablets (75 mg total) by mouth 2 (two) times daily. 270 tablet 3   Multiple Vitamin (MULITIVITAMIN WITH MINERALS) TABS Take 1 tablet by mouth daily with breakfast.      potassium chloride SA (KLOR-CON M) 20 MEQ tablet Take 2 tablets (40 mEq total) by mouth daily. 180 tablet 3   prednisoLONE acetate (PRED FORTE) 1 % ophthalmic suspension Place 1 drop into the right eye daily.     rivaroxaban (XARELTO) 20 MG TABS tablet Take 1 tablet (20 mg total) by mouth daily with supper. 90 tablet 3   simvastatin (ZOCOR) 20 MG tablet TAKE 1 TABLET AT BEDTIME 90 tablet 3   spironolactone (ALDACTONE) 25 MG tablet Take 1  tablet (25 mg total) by mouth daily. 90 tablet 3   torsemide (DEMADEX) 20 MG tablet Take 3 tablets (60 mg total) by mouth daily. 90 tablet 5   vitamin B-12 (CYANOCOBALAMIN) 500 MCG tablet Take 500 mcg by mouth daily.     No current facility-administered medications for this encounter.   Facility-Administered Medications Ordered in Other Encounters  Medication Dose Route Frequency Provider Last Rate Last Admin   lidocaine (cardiac) 100 mg/7ml (XYLOCAINE) 20 MG/ML injection 2%    Anesthesia Intra-op Epimenio Sarin, CRNA   60 mg at 06/02/17 1343   Allergies  Allergen Reactions   Trandolapril-Verapamil Hcl Er Swelling    Causes lips to swell Jolene Provost" is the name brand) Tolerates amlodipine   Social History   Socioeconomic History   Marital status: Married    Spouse name: Not on file   Number of children: Not on file   Years of education: Not on file   Highest education level: Not on file  Occupational History   Not on  file  Tobacco Use   Smoking status: Never   Smokeless tobacco: Never  Vaping Use   Vaping status: Never Used  Substance and Sexual Activity   Alcohol use: No   Drug use: No   Sexual activity: Yes    Birth control/protection: Post-menopausal, Surgical  Other Topics Concern   Not on file  Social History Narrative   Not on file   Social Drivers of Health   Financial Resource Strain: Low Risk  (12/09/2022)   Overall Financial Resource Strain (CARDIA)    Difficulty of Paying Living Expenses: Not hard at all  Food Insecurity: No Food Insecurity (12/09/2022)   Hunger Vital Sign    Worried About Running Out of Food in the Last Year: Never true    Ran Out of Food in the Last Year: Never true  Transportation Needs: No Transportation Needs (12/09/2022)   PRAPARE - Administrator, Civil Service (Medical): No    Lack of Transportation (Non-Medical): No  Physical Activity: Not on file  Stress: Not on file  Social Connections: Not on file  Intimate Partner Violence: Not on file   Family History  Problem Relation Age of Onset   Arthritis Mother    Cancer Father 50       prostate ca   Breast cancer Neg Hx    Wt Readings from Last 3 Encounters:  08/04/23 109.2 kg (240 lb 12.8 oz)  04/02/23 112.8 kg (248 lb 9.6 oz)  03/24/23 111.7 kg (246 lb 3.2 oz)   BP 102/68   Pulse 71   Wt 109.2 kg (240 lb 12.8 oz)   SpO2 100%   BMI 44.04 kg/m   PHYSICAL EXAM: General:  NAD. No resp difficulty, arrived in [facility] HEENT: Normal Neck: Supple. No JVD. Cor: Irregular rate & rhythm. No rubs, gallops or murmurs. Lungs: Clear Abdomen: Soft, obese, nontender, nondistended.  Extremities: No cyanosis, clubbing, rash, edema Neuro: Alert & oriented x 3, moves all 4 extremities w/o difficulty. Affect pleasant.  ASSESSMENT & PLAN: 1. Chronic diastolic CHF with prominent RV dysfunction: Echo in 3/23 with EF 65-70%, moderate LVH, D-shaped septum, mild RVE with mildly decreased systolic function,  PASP 58, severe biatrial enlargement, at least moderate MR, severe TR, mild-moderate AI, IVC dilated. RHC in 4/23 showed markedly elevated right and left heart filling pressures with prominent v-waves in the PCWP tracing.  PVR not elevated, pulmonary venous hypertension.  LV and RV failure components with  valvular heart disease.  Chronic atrial fibrillation may also be a major driver for CHF. Multiple Myeloma study and immunofixation both normal. PYP 5/23 suggestive but not diagnostic of cardiac amyloidosis. However, cardiac MRI was NOT suggestive of cardiac amyloidosis with no LGE and no significant ECV percentage elevation.  [employer] gene testing was negative for hATTR.  Repeat PYP scan in 10/23 was equivocal.  Echo 12/09/22 EF 65-70%, mild concentric LVH, RV mildly reduced, LA/RA severely dilated, moderate MR/AI, TR severe. Cardiac amyloidosis diagnosis still remains uncertain. Abdominal fat pad bx 11/24 was negative for amyloid, can consider endocardial bx when this becomes available at our institution. NYHA class II-III symptoms, limited more by body habitus and knee OA. She does look volume overloaded.  - Continue torsemide 60 mg daily + 40 KCL daily. BMET and BNP today. - Continue spironolactone 25 mg daily.  - Continue empagliflozin 10 mg daily. No GU symptoms. - Continue Toprol XL 75 mg bid.  - No Entresto (h/o angioedema w/ ACEi).  2. Pulmonary hypertension: This appears to be pulmonary venous hypertension.  Treatment will be diuresis.  - Continue torsemide as above. 3. Permanent atrial fibrillation: Severe biatrial enlargement, AF present for several years. She is well rate-controlled. Unlikely to successfully cardiovert even with anti-arrhythmic given severe LAE and chronicity.  - Continue Xarelto. No bleeding issues. CBC today 4. OSA: Continue CPAP.  - No change. 5. Mitral Regurgitation: Possible atrial functional MR with severe LAE. TEE 4/23 showed moderate MR w/ restriction of the posterior  leaflet. ERO  0.13 cm^2 by PISA, vena contract area 0.21 cm^2. Echo 7/24 with moderate MR. - Repeat echo next visit. 6. Tricuspid Regurgitation: Mod-severe on TEE 4/23.  Echo 7/24 with severe TR. Medically manage for now, may be candidate down the road for percutaneous repair.  - Repeat echo next visit. 7. CKD: Stage 3. Baseline SCr 1.3. - Continue empagliflozin. BMET today  Follow up in 3-4 months with Dr. Shirlee Latch + echo.  Anderson Malta [place], FNP-BC 08/04/2023

## 2023-08-04 NOTE — Patient Instructions (Addendum)
 Thank you for coming in today  If you had labs drawn today, any labs that are abnormal the clinic will call you No news is good news  Medications: No changes  Follow up appointments:  Your physician recommends that you schedule a follow-up appointment in:  4 months With Dr. Shirlee Latch with echocardiogram   Your physician has requested that you have an echocardiogram.  Echocardiography is a painless test that uses sound waves to create images of your heart. It provides your doctor with information about the size and shape of your heart and how well your heart's chambers and valves are working. This procedure takes approximately one hour. There are no restrictions for this procedure.       Do the following things EVERYDAY: Weigh yourself in the morning before breakfast. Write it down and keep it in a log. Take your medicines as prescribed Eat low salt foods--Limit salt (sodium) to 2000 mg per day.  Stay as active as you can everyday Limit all fluids for the day to less than 2 liters   At the [facility], you and your health needs are our priority. As part of our continuing mission to provide you with exceptional heart care, we have created designated Provider Care Teams. These Care Teams include your primary Cardiologist (physician) and Advanced Practice Providers (APPs- Physician Assistants and Nurse Practitioners) who all work together to provide you with the care you need, when you need it.   You may see any of the following providers on your designated Care Team at your next follow up: Dr Arvilla Meres Dr Marca Ancona Dr. Marcos Eke, NP Robbie Lis, [place] [facility] [place], [place] Brynda Peon, NP Karle Plumber, PharmD   Please be sure to bring in all your medications bottles to every appointment.    Thank you for choosing [facility]  If you have any questions or concerns before your  next appointment please send [name] a message through [place] or call our office at [phone].    TO LEAVE A MESSAGE FOR THE NURSE SELECT OPTION 2, PLEASE LEAVE A MESSAGE INCLUDING: YOUR NAME DATE OF BIRTH CALL BACK NUMBER REASON FOR CALL**this is important as we prioritize the call backs  YOU WILL RECEIVE A CALL BACK THE SAME DAY AS LONG AS YOU CALL BEFORE 4:00 PM

## 2023-08-21 ENCOUNTER — Ambulatory Visit (INDEPENDENT_AMBULATORY_CARE_PROVIDER_SITE_OTHER): Payer: TRICARE For Life (TFL) | Admitting: Adult Health

## 2023-08-21 ENCOUNTER — Encounter: Payer: Self-pay | Admitting: Adult Health

## 2023-08-21 VITALS — BP 108/70 | HR 80 | Ht 62.0 in | Wt 245.4 lb

## 2023-08-21 DIAGNOSIS — G4733 Obstructive sleep apnea (adult) (pediatric): Secondary | ICD-10-CM

## 2023-08-21 DIAGNOSIS — I5032 Chronic diastolic (congestive) heart failure: Secondary | ICD-10-CM | POA: Diagnosis not present

## 2023-08-21 NOTE — Assessment & Plan Note (Signed)
 Severe obstructive sleep apnea with significant symptom burden.  Has perceived benefit on CPAP.  Needs new supplies.  Order sent to DME company.  Patient has had significant family stressors with recent death of her daughter.  She is restarting CPAP.    Plan  Patient Instructions  Continue on CPAP At bedtime , wear all night long Order for new supplies  Do not drive if sleepy .  Work on healthy weight loss.  Follow up in 1 year and As needed   Please contact office for sooner follow up if symptoms do not improve or worsen or seek emergency care

## 2023-08-21 NOTE — Assessment & Plan Note (Signed)
 Healthy weight loss

## 2023-08-21 NOTE — Assessment & Plan Note (Signed)
 Appears euvolemic continue cardiology

## 2023-08-21 NOTE — Progress Notes (Signed)
 @Patient  ID: Artis Flock, female    DOB: 10/08/1950, 73 y.o.   MRN: [id number]  Chief Complaint  Patient presents with   Follow-up    Referring provider: Delorse Lek, MD  HPI: 73 yo female followed for OSA  Medical history significant for A Fib, CHF, Valvular heart disease, pulmonary venous hypertension, panuveitis, glaucoma.    TEST/EVENTS :  PSG 06/09/97 >> AHI 37 ONO with CPAP 11/30/17 >> test time 6 hrs.  Baseline SpO2 95%, low SpO2 83%.  Spent 7 min with SpO2 < 88%. Auto CPAP 09/03/22 to 10/02/22 >> used on 29 of 30 nights with average 4 hrs 55 min.  Average AHI 3.8 with median CPAP 7 and 95 th percentile CPAP 10 cm H2O.  Air leak noted.   Cardiac Tests:  Echo 08/06/21 >> EF 65 to 70%, mod LVH, IV septum flattened, RVSP 57.8 mmHg, severe LA and RA dilation, mod MR, severe TR, mild/mod AR  08/21/2023 Follow up : OSA  Patient returns for a follow up for sleep apnea. She is on CPAP At bedtime. Says she typically wears each night and feels she benefit from CPAP. Has not been able to wear for last several weeks due to family issues.  Has been under stress, daughter diagnosed with stomach cancer, has been staying with her at the hospital . She passed away 2 weeks ago.  She needs new CPAP supplies. Mask is leaking . CPAP download shows decreased usage , average at 4hr with AHI 4/hr. she uses a fullface mask.  DME is [employer].  Says her vision is doing much better. Followed by ophthalmology.  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  Has CHF says her chronic swelling has been doing well.  Remains on her diuretic therapy.  Remains on Xarelto for A-fib.  Denies any bleeding.   Allergies  Allergen Reactions   Trandolapril-Verapamil Hcl Er Swelling    Causes lips to swell Jolene Provost" is the name brand) Tolerates amlodipine    Immunization History  Administered Date(s) Administered   Fluad Quad(high Dose 65+) 02/22/2020, 03/04/2021, 03/18/2022   Influenza Split 04/25/2009,  03/23/2011, 03/15/2012, 03/26/2013, 03/12/2017   Influenza Whole 04/25/2009   Influenza, High Dose Seasonal PF 03/17/2016, 02/28/2019, 02/22/2020   Influenza, Quadrivalent, Recombinant, Inj, Pf 03/03/2017   Influenza,inj,Quad PF,6+ Mos 02/19/2018   Influenza,inj,quad, With Preservative 02/23/2018   Influenza-Unspecified 03/23/2011, 03/15/2012, 03/26/2013, 03/26/2015, 03/17/2016, 03/12/2017, 02/23/2018   Moderna Covid-19 Fall Seasonal Vaccine 51yrs & older 06/17/2022, 04/02/2023   PFIZER(Purple Top)SARS-COV-2 Vaccination 07/19/2019, 08/09/2019, 02/09/2020, 03/05/2020   Pfizer Covid-19 Vaccine Bivalent Booster 54yrs & up 07/01/2021   Pneumococcal Conjugate-13 04/25/2009, 10/10/2015   Pneumococcal Polysaccharide-23 04/25/2009, 12/22/2017   Pneumococcal-Unspecified 04/25/2009   Tdap 08/01/2010   Zoster Recombinant(Shingrix) 04/05/2019, 06/09/2019    Past Medical History:  Diagnosis Date   Arthritis    Asthma    Blood transfusion    hx of last one in 1987   Breast cancer (HCC) 2013   Right Breast Cancer   Cancer of lower-inner quadrant of female breast (HCC) 07/24/2011   right breast cancer /IDC,stage IIB,er/pr=+,her2=Neg   Glaucoma    Heart murmur    History of chemotherapy    taxotere/cytoxan 6 cycles 09/22/11-01/05/12 day 2 neulasta   Hyperlipemia    Hypertension    Lymphedema    right arm wears a sleeve   MRSA (methicillin resistant staph aureus) culture positive    08/2011   Neuromuscular disorder (HCC)    peripheral neuropathy hands feet  OSA (obstructive sleep apnea)    CPAP settings- ?    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 Franky Macho follows    Tobacco History: Social History   Tobacco Use  Smoking Status Never  Smokeless Tobacco Never   Counseling given: Not Answered   Outpatient Medications Prior  to Visit  Medication Sig Dispense Refill   acetaminophen (TYLENOL) 325 MG tablet Take 325-650 mg by mouth every 6 (six) hours as needed (for pain.).     acetaZOLAMIDE (DIAMOX) 250 MG tablet Take 500 mg by mouth 2 (two) times daily.     albuterol (PROAIR HFA) 108 (90 Base) MCG/ACT inhaler Inhale 1-2 puffs into the lungs every 6 (six) hours as needed for wheezing or shortness of breath. For shortness of breath 18 g 3   azaTHIOprine (IMURAN) 50 MG tablet Take 100 mg by mouth daily.     cabergoline (DOSTINEX) 0.5 MG tablet Take 0.5 mg by mouth 2 (two) times a week. Tuesday and Thursday     chlorpheniramine-HYDROcodone (TUSSIONEX) 10-8 MG/5ML Take 5 mLs by mouth every 12 (twelve) hours as needed for cough. 70 mL 0   Cholecalciferol (VITAMIN D3) 1000 UNITS CAPS Take 1,000 Units by mouth every other day.      dorzolamide (TRUSOPT) 2 % ophthalmic solution Place 1 drop into both eyes 2 (two) times daily.     empagliflozin (JARDIANCE) 10 MG TABS tablet Take 1 tablet (10 mg total) by mouth daily. 90 tablet 3   GLUCOSAMINE HCL PO Take 15 mg by mouth daily.     latanoprost (XALATAN) 0.005 % ophthalmic solution Place 1 drop into both eyes at bedtime.     metoprolol succinate (TOPROL-XL) 50 MG 24 hr tablet Take 1.5 tablets (75 mg total) by mouth 2 (two) times daily. 270 tablet 3   Multiple Vitamin (MULITIVITAMIN WITH MINERALS) TABS Take 1 tablet by mouth daily with breakfast.      potassium chloride SA (KLOR-CON M) 20 MEQ tablet Take 2 tablets (40 mEq total) by mouth daily. 180 tablet 3   prednisoLONE acetate (PRED FORTE) 1 % ophthalmic suspension Place 1 drop into the right eye daily.     rivaroxaban (XARELTO) 20 MG TABS tablet Take 1 tablet (20 mg total) by mouth daily with supper. 90 tablet 3   simvastatin (ZOCOR) 20 MG tablet TAKE 1 TABLET AT BEDTIME 90 tablet 3   spironolactone (ALDACTONE) 25 MG tablet Take 1 tablet (25 mg total) by mouth daily. 90 tablet 3   torsemide (DEMADEX) 20 MG tablet Take 3 tablets  (60 mg total) by mouth daily. 90 tablet 5   vitamin B-12 (CYANOCOBALAMIN) 500 MCG tablet Take 500 mcg by mouth daily.     Facility-Administered Medications Prior to Visit  Medication Dose Route Frequency Provider Last Rate Last Admin   lidocaine (cardiac) 100 mg/64ml (XYLOCAINE) 20 MG/ML injection 2%    Anesthesia Intra-op Epimenio Sarin, CRNA   60 mg at 06/02/17 1343     Review of Systems:   Constitutional:   No  weight loss, night sweats,  Fevers, chills, +fatigue, or  lassitude.  HEENT:   No headaches,  Difficulty swallowing,  Tooth/dental problems, or  Sore throat,                No sneezing, itching, ear ache, nasal congestion, post nasal drip,  CV:  No chest pain,  Orthopnea, PND, swelling in lower extremities, anasarca, dizziness, palpitations, syncope.   GI  No heartburn, indigestion, abdominal pain, nausea, vomiting, diarrhea, change in bowel habits, loss of appetite, bloody stools.   Resp: No shortness of breath with exertion or at rest.  No excess mucus, no productive cough,  No non-productive cough,  No coughing up of blood.  No change in color of mucus.  No wheezing.  No chest wall deformity  Skin: no rash or lesions.  GU: no dysuria, change in color of urine, no urgency or frequency.  No flank pain, no hematuria   MS:  No joint pain or swelling.  No decreased range of motion.  No back pain.    Physical Exam  BP 108/70 (BP Location: Left Arm, Patient Position: Sitting, Cuff Size: Large)   Pulse 80   Ht 5\' 2"  (1.575 m)   Wt 245 lb 6.4 oz (111.3 kg)   SpO2 97%   BMI 44.88 kg/m   GEN: A/Ox3; pleasant , NAD, well nourished    HEENT:  [place]/AT,  NOSE-clear, THROAT-clear, no lesions, no postnasal drip or exudate noted. Class 3 MP   NECK:  Supple w/ fair ROM; no JVD; normal carotid impulses w/o bruits; no thyromegaly or nodules palpated; no lymphadenopathy.    RESP  Clear  P & A; w/o, wheezes/ rales/ or rhonchi. no accessory muscle use, no dullness to  percussion  CARD:  RRR, no m/r/g, 1+ peripheral edema, pulses intact, no cyanosis or clubbing.  GI:   Soft & nt; nml bowel sounds; no organomegaly or masses detected.   Musco: Warm bil, no deformities or joint swelling noted.   Neuro: alert, no focal deficits noted.    Skin: Warm, no lesions or rashes    Lab Results:  CBC      Imaging: No results found.  Administration History     None           No data to display          No results found for: "NITRICOXIDE"      Assessment & Plan:   Obstructive sleep apnea Severe obstructive sleep apnea with significant symptom burden.  Has perceived benefit on CPAP.  Needs new supplies.  Order sent to DME company.  Patient has had significant family stressors with recent death of her daughter.  She is restarting CPAP.    Plan  Patient Instructions  Continue on CPAP At bedtime , wear all night long Order for new supplies  Do not drive if sleepy .  Work on healthy weight loss.  Follow up in 1 year and As needed   Please contact office for sooner follow up if symptoms do not improve or worsen or seek emergency care      CHF (congestive heart failure) (HCC) Appears euvolemic continue cardiology  Obesity Healthy weight loss     Rubye Oaks, NP 08/21/2023

## 2023-08-21 NOTE — Patient Instructions (Addendum)
 Status continue on CPAP At bedtime , wear all night long Order for new supplies  Do not drive if sleepy .  Work on healthy weight loss.  Follow up in 1 year and As needed   Please contact office for sooner follow up if symptoms do not improve or worsen or seek emergency care

## 2023-10-07 ENCOUNTER — Encounter: Payer: Self-pay | Admitting: Internal Medicine

## 2023-10-07 ENCOUNTER — Ambulatory Visit: Payer: TRICARE For Life (TFL) | Attending: Internal Medicine | Admitting: Internal Medicine

## 2023-10-07 VITALS — BP 118/72 | HR 71 | Ht 62.0 in | Wt 248.2 lb

## 2023-10-07 DIAGNOSIS — I482 Chronic atrial fibrillation, unspecified: Secondary | ICD-10-CM

## 2023-10-07 DIAGNOSIS — I5032 Chronic diastolic (congestive) heart failure: Secondary | ICD-10-CM | POA: Diagnosis not present

## 2023-10-07 DIAGNOSIS — E66813 Obesity, class 3: Secondary | ICD-10-CM | POA: Diagnosis not present

## 2023-10-07 DIAGNOSIS — I272 Pulmonary hypertension, unspecified: Secondary | ICD-10-CM | POA: Diagnosis present

## 2023-10-07 DIAGNOSIS — I5033 Acute on chronic diastolic (congestive) heart failure: Secondary | ICD-10-CM | POA: Insufficient documentation

## 2023-10-07 NOTE — Progress Notes (Signed)
 Cardiology Office Note:  .    Date:  10/07/2023  ID:  Lynnae Sarna, DOB [birth year], MRN [id number] PCP: Judy Null, MD  [facility] Providers Cardiologist:  None     CC: Complex valve disease Evaluation  History of Present Illness: .    Malerie A Plaut is a 73 y.o. female with heart failure and pulmonary hypertension who presents for follow-up of her cardiac condition.  She has a history of heart failure with preserved ejection fraction, complicated by pulmonary hypertension and right ventricular dysfunction. Echocardiography in July 2024 showed mild right ventricular dysfunction, moderate pulmonary hypertension, moderate mitral regurgitation, and moderate aortic regurgitation. A subsequent echocardiogram revealed significant biatrial enlargement, severe aortic regurgitation, eccentric mitral regurgitation, and moderate right ventricular dilation with moderate tricuspid regurgitation.  She is in permanent atrial fibrillation but does not feel the arrhythmia most of the time. No shortness of breath around the house. Her current medications include torsemide  60 mg daily, acetazolamide, Jardiance  (empagliflozin ), metoprolol , and spironolactone . She has been inconsistent with her CPAP machine use, leading to sleep deprivation. She has not been taking torsemide  regularly due to personal circumstances, resulting in fluid retention and swelling, but has recently resumed regular use. Compression socks have been helping with the swelling.  Her medical history includes multiple myeloma, for which she was evaluated for cardiac amyloidosis, amyloid AL subtype, with a negative abdominal fat pad biopsy and genetic testing negative for ATTR. She has experienced personal stress due to the recent loss of her daughter to stomach cancer, impacting her adherence to her medical regimen.  In November 2024, her weight fluctuated between 238 to 240 pounds, and she experienced fluid retention due to  inconsistent medication adherence while caring for her daughter. Her fluid status was better when she was consistently taking her medications.  Discussed the use of AI scribe software for clinical note transcription with the patient, who gave verbal consent to proceed.   Relevant histories: .  Social : Daughter passed away in Oct 19, 2023 of stomach malignancy, originally from [place] ROS: As per HPI.   Studies Reviewed: .     Cardiac Studies & Procedures   ______________________________________________________________________________________________ CARDIAC CATHETERIZATION  CARDIAC CATHETERIZATION 08/28/2021  Conclusion 1. Elevated right and left heart filling pressures. 2. Prominent V waves in the PCWP tracing. 3. Moderate-severe pulmonary venous hypertension. 4. Preserved cardiac output.  Despite significantly elevated filling pressures, patient has been feeling better with increased Lasix  and was able to lie flat today.  Suspect filling pressures have been chronically elevated. - I will increase Lasix  to 80 mg bid. - Followup in office next week, may need to increase diuretic regimen again. - Depending on response to diuretics, may need admission for IV diuresis.     ECHOCARDIOGRAM  ECHOCARDIOGRAM COMPLETE 12/09/2022  Narrative ECHOCARDIOGRAM REPORT    Patient Name:   AALIJAH ALICEA Date of Exam: 12/09/2022 Medical Rec #:  [id number]       Height:       62.0 in Accession #:    [id number]      Weight:       250.4 lb Date of Birth:  20-Jun-1950       BSA:          2.103 m Patient Age:    71 years        BP:           140/80 mmHg Patient Gender: F  HR:           74 bpm. Exam Location:  Outpatient  Procedure: 2D Echo, Cardiac Doppler and Color Doppler  Indications:    Congestive Heart Failure I50.9  History:        Patient has prior history of Echocardiogram examinations, most recent 08/06/2021. CHF, Arrythmias:Atrial Fibrillation; Risk Factors:Dyslipidemia,  Hypertension, Sleep Apnea and Non-Smoker.  Sonographer:    Adelia Homestead RVT RCS Referring Phys: Fawn Hooks A [name]  IMPRESSIONS   1. Left ventricular ejection fraction, by estimation, is 65 to 70%. The left ventricle has normal function. The left ventricle has no regional wall motion abnormalities. There is mild concentric left ventricular hypertrophy. Left ventricular diastolic function could not be evaluated. There is the interventricular septum is flattened in diastole ('D' shaped left ventricle), consistent with right ventricular volume overload. 2. Right ventricular systolic function is mildly reduced. The right ventricular size is mildly enlarged. There is moderately elevated pulmonary artery systolic pressure. The estimated right ventricular systolic pressure is 54.7 mmHg. 3. Left atrial size was severely dilated. 4. Right atrial size was severely dilated. 5. The mitral valve is normal in structure. Moderate mitral valve regurgitation. No evidence of mitral stenosis. 6. Tricuspid valve regurgitation is severe. 7. The aortic valve is tricuspid. There is mild calcification of the aortic valve. Aortic valve regurgitation is moderate. Aortic valve sclerosis/calcification is present, without any evidence of aortic stenosis. 8. There is borderline dilatation of the ascending aorta, measuring 38 mm. 9. Mildly dilated pulmonary artery. 10. The inferior vena cava is dilated in size with <50% respiratory variability, suggesting right atrial pressure of 15 mmHg.  Comparison(s): No significant change from prior study. Prior images reviewed side by side.  FINDINGS Left Ventricle: Left ventricular ejection fraction, by estimation, is 65 to 70%. The left ventricle has normal function. The left ventricle has no regional wall motion abnormalities. The left ventricular internal cavity size was normal in size. There is mild concentric left ventricular hypertrophy. The interventricular septum is flattened in  diastole ('D' shaped left ventricle), consistent with right ventricular volume overload. Left ventricular diastolic function could not be evaluated due to atrial fibrillation. Left ventricular diastolic function could not be evaluated.  Right Ventricle: The right ventricular size is mildly enlarged. No increase in right ventricular wall thickness. Right ventricular systolic function is mildly reduced. There is moderately elevated pulmonary artery systolic pressure. The tricuspid regurgitant velocity is 3.15 m/s, and with an assumed right atrial pressure of 15 mmHg, the estimated right ventricular systolic pressure is 54.7 mmHg.  Left Atrium: Left atrial size was severely dilated.  Right Atrium: Right atrial size was severely dilated.  Pericardium: There is no evidence of pericardial effusion.  Mitral Valve: The mitral valve is normal in structure. Mild mitral annular calcification. Moderate mitral valve regurgitation, with centrally-directed jet. No evidence of mitral valve stenosis.  Tricuspid Valve: The tricuspid valve is normal in structure. Tricuspid valve regurgitation is severe.  Aortic Valve: The aortic valve is tricuspid. There is mild calcification of the aortic valve. Aortic valve regurgitation is moderate. Aortic valve sclerosis/calcification is present, without any evidence of aortic stenosis. Aortic valve mean gradient measures 3.0 mmHg. Aortic valve peak gradient measures 5.9 mmHg. Aortic valve area, by VTI measures 2.45 cm.  Pulmonic Valve: The pulmonic valve was grossly normal. Pulmonic valve regurgitation is mild to moderate. No evidence of pulmonic stenosis.  Aorta: The aortic root is normal in size and structure. There is borderline dilatation of the ascending aorta, measuring 38  mm.  Pulmonary Artery: The pulmonary artery is mildly dilated.  Venous: The inferior vena cava is dilated in size with less than 50% respiratory variability, suggesting right atrial pressure of  15 mmHg. The inferior vena cava and the hepatic vein show a pattern of systolic flow reversal, suggestive of tricuspid regurgitation.  IAS/Shunts: No atrial level shunt detected by color flow Doppler.   LEFT VENTRICLE PLAX 2D LVIDd:         4.00 cm LVIDs:         2.50 cm LV PW:         2.45 cm LV IVS:        1.00 cm LVOT diam:     1.90 cm LV SV:         60 LV SV Index:   29 LVOT Area:     2.84 cm   RIGHT VENTRICLE             IVC RV Basal diam:  4.50 cm     IVC diam: 2.70 cm RV S prime:     10.70 cm/s TAPSE (M-mode): 1.6 cm  LEFT ATRIUM              Index        RIGHT ATRIUM           Index LA diam:        4.50 cm  2.14 cm/m   RA Area:     35.60 cm LA Vol (A2C):   122.0 ml 58.00 ml/m  RA Volume:   154.00 ml 73.22 ml/m LA Vol (A4C):   115.0 ml 54.68 ml/m LA Biplane Vol: 126.0 ml 59.91 ml/m AORTIC VALVE                    PULMONIC VALVE AV Area (Vmax):    2.62 cm     PV Vmax:       0.81 m/s AV Area (Vmean):   2.39 cm     PV Peak grad:  2.6 mmHg AV Area (VTI):     2.45 cm AV Vmax:           121.00 cm/s AV Vmean:          83.400 cm/s AV VTI:            0.245 m AV Peak Grad:      5.9 mmHg AV Mean Grad:      3.0 mmHg LVOT Vmax:         112.00 cm/s LVOT Vmean:        70.200 cm/s LVOT VTI:          0.212 m LVOT/AV VTI ratio: 0.87 AR Vena Contracta: 0.70 cm  AORTA Ao Root diam: 2.70 cm Ao Asc diam:  3.80 cm  MITRAL VALVE                TRICUSPID VALVE MV Area (PHT): 5.20 cm     TR Peak grad:   39.7 mmHg MV Decel Time: 146 msec     TR Vmax:        315.00 cm/s MR Peak grad: 85.0 mmHg MR Mean grad: 62.0 mmHg     SHUNTS MR Vmax:      461.00 cm/s   Systemic VTI:  0.21 m MR Vmean:     376.0 cm/s    Systemic Diam: 1.90 cm MV E velocity: 156.67 cm/s MV A velocity: 57.85 cm/s MV E/A ratio:  2.71  Mihai Croitoru MD Electronically  signed by Luana Rumple MD Signature Date/Time: 12/09/2022/10:47:00 AM    Final   TEE  ECHO TEE  09/20/2021  Narrative TRANSESOPHOGEAL ECHO REPORT    Patient Name:   Lynnae Sarna Date of Exam: 09/20/2021 Medical Rec #:  [id number]       Height:       62.0 in Accession #:    [id number]      Weight:       258.4 lb Date of Birth:  [birth year]       BSA:          2.132 m Patient Age:    70 years        BP:           133/67 mmHg Patient Gender: F               HR:           87 bpm. Exam Location:  Inpatient  Procedure: Transesophageal Echo, 3D Echo, Color Doppler, Cardiac Doppler and Saline Contrast Bubble Study  Indications:     I34.0 Nonrheumatic mitral (valve) insufficiency  History:         Patient has prior history of Echocardiogram examinations, most recent 08/06/2021. CHF, Arrythmias:Atrial Fibrillation; Risk Factors:Hypertension, Dyslipidemia and Sleep Apnea.  Sonographer:     Sherline Distel Senior RDCS Referring Phys:  [id number]olinda Ne[facility] Diagnosing Phys: Archer Bear  PROCEDURE: After discussion of the risks and benefits of a TEE, an informed consent was obtained from the patient. The transesophogeal probe was passed without difficulty through the esophogus of the patient. Sedation performed by different physician. The patient was monitored while under deep sedation. Anesthestetic sedation was provided intravenously by Anesthesiology: 324mg  of Propofol . The patient developed no complications during the procedure.  IMPRESSIONS   1. Left ventricular ejection fraction, by estimation, is 60 to 65%. The left ventricle has normal function. The left ventricle has no regional wall motion abnormalities. There is moderate concentric left ventricular hypertrophy. 2. Mildly D-shaped interventricular septum suggestive of a degree of RV pressure/volume overload. Right ventricular systolic function is mildly reduced. The right ventricular size is mildly enlarged. 3. Left atrial size was severely dilated. No left atrial/left atrial appendage thrombus was detected. 4. Right atrial size was  severely dilated. 5. There was a PFO by color doppler and bubble study. 6. Peak RV-RA gradient 34 mmHg. The tricuspid valve is abnormal. Tricuspid valve regurgitation is moderate to severe. Systolic flow reversal in hepatic vein systolic PW doppler pattern. 7. The aortic valve is tricuspid. Aortic valve regurgitation is mild. No aortic stenosis is present. 8. No mitral stenosis, moderate mitral regurgitation with restriction of the posterior leaflet. ERO 0.13 cm^2 by PISA, vena contract area 0.21 cm^2. There was systolic flattening but not flow reversal in the PV doppler pattern. 9. Normal caliber thoracic aorta with mild plaque. 10. The inferior vena cava is normal in size with greater than 50% respiratory variability, suggesting right atrial pressure of 3 mmHg.  FINDINGS Left Ventricle: Left ventricular ejection fraction, by estimation, is 60 to 65%. The left ventricle has normal function. The left ventricle has no regional wall motion abnormalities. The left ventricular internal cavity size was normal in size. There is moderate concentric left ventricular hypertrophy.  Right Ventricle: Mildly D-shaped interventricular septum suggestive of a degree of RV pressure/volume overload. The right ventricular size is mildly enlarged. No increase in right ventricular wall thickness. Right ventricular systolic function is mildly reduced.  Left Atrium: Left atrial size was  severely dilated. No left atrial/left atrial appendage thrombus was detected.  Right Atrium: Right atrial size was severely dilated.  Pericardium: There is no evidence of pericardial effusion.  Mitral Valve: No mitral stenosis, moderate mitral regurgitation with restriction of the posterior leaflet. ERO 0.13 cm^2 by PISA, vena contract area 0.21 cm^2. There was systolic flattening but not flow reversal in the PV doppler pattern. The mitral valve is normal in structure. Moderate mitral valve regurgitation. MV peak gradient, 5.5 mmHg.  The mean mitral valve gradient is 2.0 mmHg.  Tricuspid Valve: Peak RV-RA gradient 34 mmHg. The tricuspid valve is abnormal. Tricuspid valve regurgitation is moderate to severe.  Aortic Valve: The aortic valve is tricuspid. Aortic valve regurgitation is mild. No aortic stenosis is present.  Pulmonic Valve: The pulmonic valve was normal in structure. Pulmonic valve regurgitation is not visualized.  Aorta: Normal caliber thoracic aorta with mild plaque. The aortic root is normal in size and structure.  Venous: The inferior vena cava is normal in size with greater than 50% respiratory variability, suggesting right atrial pressure of 3 mmHg.  IAS/Shunts: There was a PFO by color doppler and bubble study. Agitated saline contrast was given intravenously to evaluate for intracardiac shunting.   MITRAL VALVE MV Peak grad: 5.5 mmHg MV Mean grad: 2.0 mmHg MV Vmax:      1.17 m/s MV Vmean:     59.2 cm/s MR Peak grad:    77.4 mmHg MR Mean grad:    52.0 mmHg MR Vmax:         440.00 cm/s MR Vmean:        344.0 cm/s MR PISA:         1.57 cm MR PISA Eff ROA: 13 mm MR PISA Radius:  0.50 cm  Dalton McleanMD Electronically signed by Archer Bear Signature Date/Time: 10/21/2021/9:42:55 AM    Final      CARDIAC MRI  MR CARDIAC MORPHOLOGY W WO CONTRAST 12/11/2021  Narrative CLINICAL DATA:  Amyloid  EXAM: CARDIAC MRI  TECHNIQUE: The patient was scanned on a 1.5 Tesla Siemens magnet. A dedicated cardiac coil was used. Functional imaging was done using Fiesta sequences. 2,3, and 4 chamber views were done to assess for RWMA's. Modified Simpson's rule using a short axis stack was used to calculate an ejection fraction on a dedicated work [employer]. The patient received 8 cc of Gadavist . After 10 minutes inversion recovery sequences were used to assess for infiltration and scar tissue.  CONTRAST:  Gadavist   FINDINGS: Severe bi atrial enlargement No LAA thrombus  No obvious PFO/ASD. No pericardial effusion Dilated ascending thoracic aorta 4.0 cm Tri leaflet AV with thickening and mild appearing AR. Thickened mitral valve with mild appearing MR. Dilated main pulmonary artery 4.4 cm with mild appearing PR and moderate appearing TR. Moderate RV dilatation with moderate hypokinesis RVEF 29% (EDV 152 cc ESV 107 cc SV 45 cc) Normal LV size and function LVEF 59% (EDV 125 cc ESV 51 cc SV 73 cc) Delayed gadolinium images with no uptake no scar or infiltration  Normal T1 1073 msec  Normal ECV 30%  Normal T2 56 msec  IMPRESSION: 1. Normal LV size and function LVEF 59%  2.  No delayed enhancement or evidence of amyloid  3. Moderate RVE/dysfunction RVEF 29% with dilated main PA 44 mm consistent with significant RV failure  4.  Severe bi atrial enlargement  5.  Mild appearing MR  6.  Mild appearing AR  7.  Dilated ascending thoracic  aorta 4.0 cm  Janelle Mediate   Electronically Signed By: Janelle Mediate M.D. On: 12/11/2021 12:43  PYP SCAN  MYOCARDIAL AMYLOID PLANAR AND SPECT 03/11/2022  Narrative   By semi-quantitative assessment scan is consistent with heart uptake equal to rib uptake-Grade 2. Heart to contralateral lung ratio is between 1-1.5, indeterminate for amyloid.   Study is equivocal for TTR amyloidosis (visual score of 1/ratio between 1-1.5).  Anterior planar imaging demonstrates radiotracer uptake within the heart equal to uptake within the adjacent ribs (Grade 2).  Quantitative assessment of the cardiac uptake compared to the contralateral chest wall is equal to 1.2.  IMPRESSION: Visually suggestive of cardiac amyloidosis, though with equivocal ratio (similar to prior study). Uptake appears increased from prior, but unclear how prior was imaged (1 hr vs 3 hr study).  ______________________________________________________________________________________________      Physical Exam:    VS:  BP 118/72   Pulse 71   Ht 5'  2" (1.575 m)   Wt 248 lb 3.2 oz (112.6 kg)   SpO2 97%   BMI 45.40 kg/m    Wt Readings from Last 3 Encounters:  10/07/23 248 lb 3.2 oz (112.6 kg)  08/21/23 245 lb 6.4 oz (111.3 kg)  08/04/23 240 lb 12.8 oz (109.2 kg)    Gen: no distress, morbid obesity  Neck: No JVD but thick neck Cardiac: No Rubs or Gallops, systolic and diastolic murmurs, IRIR rhythm with distant heart sounds Respiratory: Clear to auscultation bilaterally, normal effort, normal  respiratory rate GI: Soft, nontender, non-distended  MS: Trace edema with compression stockings on;  moves all extremities Integument: Skin feels warm Neuro:  At time of evaluation, alert and oriented to person/place/time/situation  Psych: Normal affect, patient feels ok   ASSESSMENT AND PLAN: .     An EKG was ordered for atrial fibrillation and shows AF  Atrial fibrillation - Permanent atrial fibrillation, well controlled with metoprolol . No bleeding issues reported while on anticoagulation therapy.  Moderate aortic regurgitation Moderate aortic regurgitation with mild aortic dilation. Echocardiogram in July showed no improvement. Potential need for intervention if severity increases. Open heart surgery is not preferred due to age; alternative minimally invasive procedures will be considered if necessary. - Order echocardiogram in one month to assess aortic regurgitation - Consider CT scan if echocardiogram indicates need for intervention Maurene Sours Valve consideration vs TAVR for AI)  Moderate mitral regurgitation - Atrial functional Moderate mitral regurgitation, previously improved with fluid management. Potential for intervention if severity increases. Minimally invasive mitral valve repair may be considered if severe regurgitation is confirmed. - Order echocardiogram in one month to assess mitral regurgitation - Consider transesophageal echocardiogram if echocardiogram indicates severe regurgitation; we discussed mTEER  evaluation  Moderate tricuspid regurgitation - Moderate tricuspid regurgitation, previously improved with fluid management. Potential for intervention if severity increases. Minimally invasive tricuspid valve repair may be considered if severe regurgitation is confirmed. - Order echocardiogram in one month to assess tricuspid regurgitation - Consider transesophageal echocardiogram if echocardiogram indicates severe regurgitation, mildly symptomatic, likely tTEER feasible.  Pulmonary hypertension; WHO II and  Obstructive sleep apnea Morbid Obesity Obstructive sleep apnea with intermittent CPAP use. Reports sleep deprivation and non-adherence to CPAP therapy. Moderate pulmonary hypertension with RV dysfunction. - Encourage regular use of CPAP machine to improve sleep quality - we discussed all four of her diuretics and when they should be taken - BMP and BNP today  Multiple myeloma Multiple myeloma with negative abdominal fat pad biopsy for amyloidosis. No current cardiac involvement detected.  I do not suspect AL-CA  Gloriann Larger, MD FASE [facility] Cardiologist [facility]  [address] [place], [address] [place], [place] [address] [phone]  10:04 AM

## 2023-10-07 NOTE — Patient Instructions (Signed)
 Medication Instructions:  Your physician recommends that you continue on your current medications as directed. Please refer to the Current Medication list given to you today. Please take Torsemide  as ordered.   *If you need a refill on your cardiac medications before your next appointment, please call your pharmacy*  Lab Work: TODAY at [facility]: BMP, BNP  If you have labs (blood work) drawn today and your tests are completely normal, you will receive your results only by: MyChart Message (if you have MyChart) OR A paper copy in the mail If you have any lab test that is abnormal or we need to change your treatment, we will call you to review the results.  Testing/Procedures: IN Mid to Late June- - - Your physician has requested that you have an echocardiogram. Echocardiography is a painless test that uses sound waves to create images of your heart. It provides your doctor with information about the size and shape of your heart and how well your heart's chambers and valves are working. This procedure takes approximately one hour. There are no restrictions for this procedure. Please do NOT wear cologne, perfume, aftershave, or lotions (deodorant is allowed). Please arrive 15 minutes prior to your appointment time.  Please note: We ask at that you not bring children with you during ultrasound (echo/ vascular) testing. Due to room size and safety concerns, children are not allowed in the ultrasound rooms during exams. Our front office staff cannot provide observation of children in our lobby area while testing is being conducted. An adult accompanying a patient to their appointment will only be allowed in the ultrasound room at the discretion of the ultrasound technician under special circumstances. We apologize for any inconvenience.   Follow-Up: At [facility], you and your health needs are our priority.  As part of our continuing mission to provide you with exceptional heart care, our  providers are all part of one team.  This team includes your primary Cardiologist (physician) and Advanced Practice Providers or APPs (Physician Assistants and Nurse Practitioners) who all work together to provide you with the care you need, when you need it.  Your next appointment:   3-4 month(s)  Provider:   Dr. Mitzie Anda   We recommend signing up for the patient portal called "MyChart".  Sign up information is provided on this After Visit Summary.  MyChart is used to connect with patients for Virtual Visits (Telemedicine).  Patients are able to view lab/test results, encounter notes, upcoming appointments, etc.  Non-urgent messages can be sent to your provider as well.   To learn more about what you can do with MyChart, go to [email or web address].

## 2023-10-08 ENCOUNTER — Ambulatory Visit: Payer: Self-pay | Admitting: Internal Medicine

## 2023-10-08 DIAGNOSIS — I5032 Chronic diastolic (congestive) heart failure: Secondary | ICD-10-CM

## 2023-10-08 LAB — BASIC METABOLIC PANEL WITH GFR
BUN/Creatinine Ratio: 17 (ref 12–28)
BUN: 25 mg/dL (ref 8–27)
CO2: 25 mmol/L (ref 20–29)
Calcium: 9.8 mg/dL (ref 8.7–10.3)
Chloride: 99 mmol/L (ref 96–106)
Creatinine, Ser: 1.46 mg/dL — ABNORMAL HIGH (ref 0.57–1.00)
Glucose: 114 mg/dL — ABNORMAL HIGH (ref 70–99)
Potassium: 4.2 mmol/L (ref 3.5–5.2)
Sodium: 142 mmol/L (ref 134–144)
eGFR: 38 mL/min/{1.73_m2} — ABNORMAL LOW (ref >59)

## 2023-10-08 LAB — PRO B NATRIURETIC PEPTIDE: NT-Pro BNP: 2261 pg/mL — ABNORMAL HIGH (ref 0–301)

## 2023-11-16 ENCOUNTER — Ambulatory Visit (HOSPITAL_COMMUNITY)
Admission: RE | Admit: 2023-11-16 | Discharge: 2023-11-16 | Disposition: A | Discharge status: Home / Self Care | Source: Ambulatory Visit | Attending: Internal Medicine | Admitting: Internal Medicine

## 2023-11-16 DIAGNOSIS — I482 Chronic atrial fibrillation, unspecified: Secondary | ICD-10-CM

## 2023-11-16 DIAGNOSIS — I5032 Chronic diastolic (congestive) heart failure: Secondary | ICD-10-CM

## 2023-11-16 LAB — ECHOCARDIOGRAM COMPLETE
Area-P 1/2: 3.38 cm2
MV M vel: 4.45 m/s
MV Peak grad: 79.2 mmHg
P 1/2 time: 923 ms
Radius: 0.8 cm
S' Lateral: 2.6 cm

## 2024-02-08 ENCOUNTER — Ambulatory Visit (HOSPITAL_COMMUNITY): Admitting: Cardiology

## 2024-02-08 ENCOUNTER — Ambulatory Visit
Admission: RE | Admit: 2024-02-08 | Discharge: 2024-02-08 | Disposition: A | Discharge status: Home / Self Care | Payer: TRICARE For Life (TFL) | Source: Ambulatory Visit | Attending: Nurse Practitioner | Admitting: Nurse Practitioner

## 2024-02-08 DIAGNOSIS — Z1231 Encounter for screening mammogram for malignant neoplasm of breast: Secondary | ICD-10-CM

## 2024-02-09 ENCOUNTER — Ambulatory Visit (HOSPITAL_COMMUNITY): Admitting: Cardiology

## 2024-03-04 ENCOUNTER — Encounter (HOSPITAL_COMMUNITY): Admitting: Cardiology

## 2024-03-07 ENCOUNTER — Telehealth (HOSPITAL_COMMUNITY): Payer: Self-pay

## 2024-03-07 DIAGNOSIS — I5032 Chronic diastolic (congestive) heart failure: Secondary | ICD-10-CM

## 2024-03-07 MED ORDER — METOPROLOL SUCCINATE ER 50 MG PO TB24: 75.0000 mg | ORAL_TABLET | Freq: Two times a day (BID) | ORAL | 0 refills | Status: DC

## 2024-03-07 NOTE — Telephone Encounter (Signed)
 Patient's Metoprolol  ER Succinate has been sent to pharmacy.

## 2024-03-25 ENCOUNTER — Other Ambulatory Visit: Payer: Self-pay

## 2024-03-25 DIAGNOSIS — Z170 Estrogen receptor positive status [ER+]: Secondary | ICD-10-CM

## 2024-03-27 NOTE — Assessment & Plan Note (Signed)
 Right breast cancer  -diagnosed 08/05/2011 cT3NxMx stage IIB invasive ductal carcinoma  -s/p right simple mastectomy with ALND 08/14/2011 final path revealed invasive and in situ ductal carcinoma, margins negative, focal LVI and 1/15 + LNs, ER/PR + HER2 negative final pT2N1aMx stage IIB -s/p adjuvant chemo with taxotere /cytoxan  q3 weeks x6 09/22/11 - 01/05/12 -s/p adjuvant radiation completed 03/2012 -s/p adjuvant anastrozole  x7 years completed 03/2019 -[name] is clinically doing well.  Breast exam reveals a discrete nodule at the lateral right mastectomy scar, likely scar tissue but feels more firm.  Although recurrence 10 years from initial diagnosis is unlikely, ultrasound of nodular area which was negative for malignancy.  .-Left mammo 02/09/2023 was negative, continue annually - 02/08/2024 -unilateral left screening mammogram was negative.

## 2024-03-27 NOTE — Progress Notes (Unsigned)
 Patient Care Team: Linnie Thresa LABOR, MD as PCP - General (Family Medicine) Santo Stanly LABOR, MD as Consulting Physician (Cardiology)  Clinic Day:  03/28/2024  Referring physician: Linnie Thresa LABOR, MD  ASSESSMENT & PLAN:   Assessment & Plan: Breast cancer of lower-inner quadrant of right female breast [facility])  Right breast cancer  -diagnosed 08/05/2011 cT3NxMx stage IIB invasive ductal carcinoma  -s/p right simple mastectomy with ALND 08/14/2011 final path revealed invasive and in situ ductal carcinoma, margins negative, focal LVI and 1/15 + LNs, ER/PR + HER2 negative final pT2N1aMx stage IIB -s/p adjuvant chemo with taxotere /cytoxan  q3 weeks x6 09/22/11 - 01/05/12 -s/p adjuvant radiation completed 03/2012 -s/p adjuvant anastrozole  x7 years completed 03/2019 -Ms. [name] is clinically doing well.  Breast exam reveals a discrete nodule at the lateral right mastectomy scar, likely scar tissue but feels more firm.  Although recurrence 10 years from initial diagnosis is unlikely, ultrasound of nodular area which was negative for malignancy.  .-Left mammo 02/09/2023 was negative, continue annually - 02/08/2024 -unilateral left screening mammogram was negative. New imaging ordered for 01/2025 as part of today's visit.  -plan for labs and follow up in 1 year, sooner if needed.    Chronic atrial fibrillation Irregular heart rhythm noted. She is on xarelto  and other medications to regulate heart rate and rhythm. She has good blood pressure control. She is managed by cardiology.   Complicated grief States that she has lost her daughter in Match 2025 and then her mother later on in the year. She states that she is processing the deaths as well as she can. We briefly discussed grief counseling. [facility] and [facility] both offer grief counseling, even if family members were not in hospice. She stated that she would look into this and consider using this service.   Anemia Mild and stable.  This is likely from chronic disease and possibly slow GI bleeding. She is on xarelto , putting her at increased risk for bleeding. This does not require intervention at this time. Will continue to monitor closely.   Plan Labs reviewed.  -Mild and stable anemia -reduced renal functions which are stable.  Reviewed left screening mammogram from 01/2024 which was benign.  -repeat imaging in 01/2025.  Plan for labs and follow up in 1 year, sooner if needed.   The patient understands the plans discussed today and is in agreement with them.  She knows to contact our office if she develops concerns prior to her next appointment.  I provided 25 minutes of face-to-face time during this encounter and > 50% was spent counseling as documented under my assessment and plan.    Powell FORBES Lessen, NP  [facility] - A DEPT [facility] [address] [place] [address] Dept: [phone] Dept Fax: [phone]   Orders Placed This Encounter  Procedures   MM 3D SCREENING MAMMOGRAM UNILATERAL LEFT BREAST    Standing Status:   Future    Expected Date:   02/17/2025    Expiration Date:   05/18/2025    Reason for Exam (SYMPTOM  OR DIAGNOSIS REQUIRED):   screening for breast cancer. history of right breast cancer    Preferred imaging location?:   [facility]      CHIEF COMPLAINT:  CC: Right breast cancer  Current Treatment: Annual surveillance  INTERVAL HISTORY:  Kathryn is here today for repeat clinical assessment.  She was last seen by myself on  03/24/2023.  Unilateral left screening mammogram done on 02/08/2024.  Results were negative.  She has breast category B.  She states that her year has been complicated, losing her daughter in 07/2023 and her mother a few months later. Continues to grieve for them both. She denies changes in the left breast or in the right chest wall. She denies unusual hot flashes or night sweats. She denies  chest pain, chest pressure, or shortness of breath, out of the ordinary. She does have chronic CHF and COPD. She denies headaches or visual disturbances. She denies abdominal pain, nausea, vomiting, or changes in bowel or bladder habits.  She denies fevers or chills. She denies pain. Her appetite is good. Her weight has been stable.  I have reviewed the past medical history, past surgical history, social history and family history with the patient and they are unchanged from previous note.  ALLERGIES:  is allergic to trandolapril-verapamil hcl er.  MEDICATIONS:  Current Outpatient Medications  Medication Sig Dispense Refill   acetaminophen  (TYLENOL ) 325 MG tablet Take 325-650 mg by mouth every 6 (six) hours as needed (for pain.).     acetaZOLAMIDE (DIAMOX) 250 MG tablet Take 500 mg by mouth 2 (two) times daily.     albuterol  (PROAIR  HFA) 108 (90 Base) MCG/ACT inhaler Inhale 1-2 puffs into the lungs every 6 (six) hours as needed for wheezing or shortness of breath. For shortness of breath 18 g 3   azaTHIOprine (IMURAN) 50 MG tablet Take 100 mg by mouth daily.     cabergoline  (DOSTINEX ) 0.5 MG tablet Take 0.5 mg by mouth 2 (two) times a week. Tuesday and Thursday     chlorpheniramine-HYDROcodone  (TUSSIONEX) 10-8 MG/5ML Take 5 mLs by mouth every 12 (twelve) hours as needed for cough. 70 mL 0   Cholecalciferol  (VITAMIN D3) 1000 UNITS CAPS Take 1,000 Units by mouth every other day.      dorzolamide (TRUSOPT) 2 % ophthalmic solution Place 1 drop into both eyes 2 (two) times daily.     empagliflozin  (JARDIANCE ) 10 MG TABS tablet Take 1 tablet (10 mg total) by mouth daily. 90 tablet 3   GLUCOSAMINE HCL PO Take 15 mg by mouth daily.     latanoprost  (XALATAN ) 0.005 % ophthalmic solution Place 1 drop into both eyes at bedtime.     metoprolol  succinate (TOPROL -XL) 50 MG 24 hr tablet Take 1.5 tablets (75 mg total) by mouth 2 (two) times daily. 270 tablet 0   Multiple Vitamin (MULITIVITAMIN WITH MINERALS)  TABS Take 1 tablet by mouth daily with breakfast.      potassium chloride  SA (KLOR-CON  M) 20 MEQ tablet Take 2 tablets (40 mEq total) by mouth daily. 180 tablet 3   prednisoLONE acetate (PRED FORTE) 1 % ophthalmic suspension Place 1 drop into the right eye daily.     rivaroxaban  (XARELTO ) 20 MG TABS tablet Take 1 tablet (20 mg total) by mouth daily with supper. 90 tablet 3   simvastatin  (ZOCOR ) 20 MG tablet TAKE 1 TABLET AT BEDTIME 90 tablet 3   spironolactone  (ALDACTONE ) 25 MG tablet Take 1 tablet (25 mg total) by mouth daily. 90 tablet 3   torsemide  (DEMADEX ) 20 MG tablet Take 3 tablets (60 mg total) by mouth daily. 90 tablet 5   vitamin B-12 (CYANOCOBALAMIN ) 500 MCG tablet Take 500 mcg by mouth daily.     No current facility-administered medications for this visit.   Facility-Administered Medications Ordered in Other Visits  Medication Dose Route Frequency Provider Last Rate Last  Admin   lidocaine  (cardiac) 100 mg/22ml (XYLOCAINE ) 20 MG/ML injection 2%    Anesthesia Intra-op Jarvela, Joshua R, CRNA   60 mg at 06/02/17 1343    HISTORY OF PRESENT ILLNESS:   Oncology History  Breast cancer of lower-inner quadrant of right female breast (HCC)  07/24/2011 Initial Diagnosis   Breast cancer of lower-inner quadrant of right female breast (HCC)   02/09/2023 Mammogram   3D screening mammogram left breast IMPRESSION: No mammographic evidence of malignancy.  RECOMMENDATION: Screening mammogram in one year. (Code:[id number])  BI-RADS CATEGORY  1: Negative.         REVIEW OF SYSTEMS:   Constitutional: Denies fevers, chills or abnormal weight loss. Fatigue.  Eyes: Denies blurriness of vision Ears, nose, mouth, throat, and face: Denies mucositis or sore throat Respiratory: Denies cough. Has baseline SOB and wheezing. She has chronic CHF and COPD.  Cardiovascular: Denies palpitation, chest discomfort or lower extremity swelling Gastrointestinal:  Denies nausea, heartburn or change in bowel  habits Skin: Denies abnormal skin rashes Lymphatics: Denies new lymphadenopathy or easy bruising Neurological:Denies numbness, tingling or new weaknesses Behavioral/Psych: Mood is stable, no new changes  All other systems were reviewed with the patient and are negative.   VITALS:   Today's Vitals   03/28/24 0858 03/28/24 0949  BP: 120/88   Pulse: 68   Resp: 17   Temp: 97.8 F (36.6 C)   SpO2: 96%   Weight: 248 lb (112.5 kg)   PainSc:  8    Body mass index is 45.36 kg/m.   Wt Readings from Last 3 Encounters:  03/28/24 248 lb (112.5 kg)  10/07/23 248 lb 3.2 oz (112.6 kg)  08/21/23 245 lb 6.4 oz (111.3 kg)    Body mass index is 45.36 kg/m.  Performance status (ECOG): 1 - Symptomatic but completely ambulatory  PHYSICAL EXAM:   GENERAL:alert, no distress and comfortable SKIN: skin color, texture, turgor are normal, no rashes or significant lesions EYES: normal, Conjunctiva are pink and non-injected, sclera clear OROPHARYNX:no exudate, no erythema and lips, buccal mucosa, and tongue normal  NECK: supple, thyroid  normal size, non-tender, without nodularity LYMPH:  no palpable lymphadenopathy in the cervical, axillary or inguinal LUNGS: clear to auscultation and percussion with normal breathing effort HEART: irregular rate & rhythm and no murmurs and no lower extremity edema ABDOMEN:abdomen soft, non-tender and normal bowel sounds Musculoskeletal:no cyanosis of digits and no clubbing  NEURO: alert & oriented x 3 with fluent speech, no focal motor/sensory deficits BREAST: the right breast is surgically absent. She has well healed mastectomy scars. There are no palpable lumps or nodules around the right chest wall. There is no axillary lymphadenopathy on the right. There are no palpable lumps or masses in the left breast. There is no axillary lymphadenopathy on the left side.   LABORATORY DATA:  I have reviewed the data as listed    Component Value Date/Time   NA 139  03/28/2024 0825   NA 142 10/07/2023 0931   NA 141 02/20/2017 1015   K 4.3 03/28/2024 0825   K 3.7 02/20/2017 1015   CL 103 03/28/2024 0825   CL 100 06/21/2012 0936   CO2 29 03/28/2024 0825   CO2 29 02/20/2017 1015   GLUCOSE 83 03/28/2024 0825   GLUCOSE 92 02/20/2017 1015   GLUCOSE 114 (H) 06/21/2012 0936   BUN 25 (H) 03/28/2024 0825   BUN 25 10/07/2023 0931   BUN 17.7 02/20/2017 1015   CREATININE 1.60 (H) 03/28/2024 0825   CREATININE  0.9 02/20/2017 1015   CALCIUM 9.2 03/28/2024 0825   CALCIUM 9.7 02/20/2017 1015   PROT 6.8 03/28/2024 0825   PROT 6.5 10/15/2018 1047   PROT 7.4 02/20/2017 1015   ALBUMIN 4.1 03/28/2024 0825   ALBUMIN 4.3 10/15/2018 1047   ALBUMIN 3.9 02/20/2017 1015   AST 12 (L) 03/28/2024 0825   AST 15 02/20/2017 1015   ALT <5 03/28/2024 0825   ALT 11 02/20/2017 1015   ALKPHOS 60 03/28/2024 0825   ALKPHOS 89 02/20/2017 1015   BILITOT 1.4 (H) 03/28/2024 0825   BILITOT 1.69 (H) 02/20/2017 1015   GFRNONAA 33 (L) 03/28/2024 0825   GFRAA 52 (L) 03/28/2019 0920    Lab Results  Component Value Date   WBC 4.4 03/28/2024   NEUTROABS 2.7 03/28/2024   HGB 11.3 (L) 03/28/2024   HCT 35.0 (L) 03/28/2024   MCV 94.3 03/28/2024   PLT 175 03/28/2024

## 2024-03-28 ENCOUNTER — Inpatient Hospital Stay: Payer: Medicare Other | Admitting: Nurse Practitioner

## 2024-03-28 ENCOUNTER — Inpatient Hospital Stay: Payer: Medicare Other

## 2024-03-28 ENCOUNTER — Encounter: Payer: Self-pay | Admitting: Nurse Practitioner

## 2024-03-28 VITALS — BP 120/88 | HR 68 | Temp 97.8°F | Resp 17 | Wt 248.0 lb

## 2024-03-28 DIAGNOSIS — C50311 Malignant neoplasm of lower-inner quadrant of right female breast: Secondary | ICD-10-CM | POA: Diagnosis not present

## 2024-03-28 DIAGNOSIS — Z923 Personal history of irradiation: Secondary | ICD-10-CM | POA: Insufficient documentation

## 2024-03-28 DIAGNOSIS — Z79631 Long term (current) use of antimetabolite agent: Secondary | ICD-10-CM | POA: Insufficient documentation

## 2024-03-28 DIAGNOSIS — Z1231 Encounter for screening mammogram for malignant neoplasm of breast: Secondary | ICD-10-CM | POA: Diagnosis not present

## 2024-03-28 DIAGNOSIS — D649 Anemia, unspecified: Secondary | ICD-10-CM | POA: Insufficient documentation

## 2024-03-28 DIAGNOSIS — A419 Sepsis, unspecified organism: Secondary | ICD-10-CM | POA: Diagnosis not present

## 2024-03-28 DIAGNOSIS — J449 Chronic obstructive pulmonary disease, unspecified: Secondary | ICD-10-CM | POA: Insufficient documentation

## 2024-03-28 DIAGNOSIS — Z9221 Personal history of antineoplastic chemotherapy: Secondary | ICD-10-CM | POA: Insufficient documentation

## 2024-03-28 DIAGNOSIS — I509 Heart failure, unspecified: Secondary | ICD-10-CM | POA: Insufficient documentation

## 2024-03-28 DIAGNOSIS — R509 Fever, unspecified: Secondary | ICD-10-CM | POA: Diagnosis not present

## 2024-03-28 DIAGNOSIS — Z79624 Long term (current) use of inhibitors of nucleotide synthesis: Secondary | ICD-10-CM | POA: Insufficient documentation

## 2024-03-28 DIAGNOSIS — Z7901 Long term (current) use of anticoagulants: Secondary | ICD-10-CM | POA: Insufficient documentation

## 2024-03-28 DIAGNOSIS — Z853 Personal history of malignant neoplasm of breast: Secondary | ICD-10-CM | POA: Insufficient documentation

## 2024-03-28 DIAGNOSIS — Z170 Estrogen receptor positive status [ER+]: Secondary | ICD-10-CM | POA: Diagnosis not present

## 2024-03-28 DIAGNOSIS — Z79899 Other long term (current) drug therapy: Secondary | ICD-10-CM | POA: Insufficient documentation

## 2024-03-28 DIAGNOSIS — F4381 Prolonged grief disorder: Secondary | ICD-10-CM | POA: Insufficient documentation

## 2024-03-28 DIAGNOSIS — I482 Chronic atrial fibrillation, unspecified: Secondary | ICD-10-CM | POA: Insufficient documentation

## 2024-03-28 LAB — CBC WITH DIFFERENTIAL (CANCER CENTER ONLY)
Abs Immature Granulocytes: 0.01 K/uL (ref 0.00–0.07)
Basophils Absolute: 0 K/uL (ref 0.0–0.1)
Basophils Relative: 1 %
Eosinophils Absolute: 0.7 K/uL — ABNORMAL HIGH (ref 0.0–0.5)
Eosinophils Relative: 16 %
HCT: 35 % — ABNORMAL LOW (ref 36.0–46.0)
Hemoglobin: 11.3 g/dL — ABNORMAL LOW (ref 12.0–15.0)
Immature Granulocytes: 0 %
Lymphocytes Relative: 13 %
Lymphs Abs: 0.6 K/uL — ABNORMAL LOW (ref 0.7–4.0)
MCH: 30.5 pg (ref 26.0–34.0)
MCHC: 32.3 g/dL (ref 30.0–36.0)
MCV: 94.3 fL (ref 80.0–100.0)
Monocytes Absolute: 0.4 K/uL (ref 0.1–1.0)
Monocytes Relative: 9 %
Neutro Abs: 2.7 K/uL (ref 1.7–7.7)
Neutrophils Relative %: 61 %
Platelet Count: 175 K/uL (ref 150–400)
RBC: 3.71 MIL/uL — ABNORMAL LOW (ref 3.87–5.11)
RDW: 14.8 % (ref 11.5–15.5)
WBC Count: 4.4 K/uL (ref 4.0–10.5)
nRBC: 0 % (ref 0.0–0.2)

## 2024-03-28 LAB — CMP (CANCER CENTER ONLY)
ALT: <5 U/L (ref 0–44)
AST: 12 U/L — ABNORMAL LOW (ref 15–41)
Albumin: 4.1 g/dL (ref 3.5–5.0)
Alkaline Phosphatase: 60 U/L (ref 38–126)
Anion gap: 7 (ref 5–15)
BUN: 25 mg/dL — ABNORMAL HIGH (ref 8–23)
CO2: 29 mmol/L (ref 22–32)
Calcium: 9.2 mg/dL (ref 8.9–10.3)
Chloride: 103 mmol/L (ref 98–111)
Creatinine: 1.6 mg/dL — ABNORMAL HIGH (ref 0.44–1.00)
GFR, Estimated: 33 mL/min — ABNORMAL LOW (ref >60)
Glucose, Bld: 83 mg/dL (ref 70–99)
Potassium: 4.3 mmol/L (ref 3.5–5.1)
Sodium: 139 mmol/L (ref 135–145)
Total Bilirubin: 1.4 mg/dL — ABNORMAL HIGH (ref 0.0–1.2)
Total Protein: 6.8 g/dL (ref 6.5–8.1)

## 2024-03-29 ENCOUNTER — Emergency Department (HOSPITAL_COMMUNITY)

## 2024-03-29 ENCOUNTER — Inpatient Hospital Stay (HOSPITAL_COMMUNITY)
Admission: EM | Admit: 2024-03-29 | Discharge: 2024-04-07 | DRG: 871 | Disposition: A | Discharge status: Home / Self Care | Attending: Internal Medicine | Admitting: Internal Medicine

## 2024-03-29 ENCOUNTER — Other Ambulatory Visit: Payer: Self-pay

## 2024-03-29 ENCOUNTER — Encounter (HOSPITAL_COMMUNITY): Payer: Self-pay | Admitting: *Deleted

## 2024-03-29 DIAGNOSIS — D352 Benign neoplasm of pituitary gland: Secondary | ICD-10-CM | POA: Diagnosis present

## 2024-03-29 DIAGNOSIS — A419 Sepsis, unspecified organism: Principal | ICD-10-CM | POA: Diagnosis present

## 2024-03-29 DIAGNOSIS — E872 Acidosis, unspecified: Secondary | ICD-10-CM | POA: Diagnosis present

## 2024-03-29 DIAGNOSIS — Z23 Encounter for immunization: Secondary | ICD-10-CM

## 2024-03-29 DIAGNOSIS — J189 Pneumonia, unspecified organism: Secondary | ICD-10-CM | POA: Diagnosis present

## 2024-03-29 DIAGNOSIS — Z634 Disappearance and death of family member: Secondary | ICD-10-CM

## 2024-03-29 DIAGNOSIS — G4733 Obstructive sleep apnea (adult) (pediatric): Secondary | ICD-10-CM | POA: Diagnosis present

## 2024-03-29 DIAGNOSIS — Z9221 Personal history of antineoplastic chemotherapy: Secondary | ICD-10-CM

## 2024-03-29 DIAGNOSIS — J45909 Unspecified asthma, uncomplicated: Secondary | ICD-10-CM | POA: Diagnosis present

## 2024-03-29 DIAGNOSIS — Z79899 Other long term (current) drug therapy: Secondary | ICD-10-CM

## 2024-03-29 DIAGNOSIS — Z6841 Body Mass Index (BMI) 40.0 and over, adult: Secondary | ICD-10-CM

## 2024-03-29 DIAGNOSIS — D649 Anemia, unspecified: Secondary | ICD-10-CM | POA: Insufficient documentation

## 2024-03-29 DIAGNOSIS — R011 Cardiac murmur, unspecified: Secondary | ICD-10-CM | POA: Diagnosis present

## 2024-03-29 DIAGNOSIS — H44111 Panuveitis, right eye: Secondary | ICD-10-CM | POA: Diagnosis present

## 2024-03-29 DIAGNOSIS — I482 Chronic atrial fibrillation, unspecified: Secondary | ICD-10-CM | POA: Diagnosis not present

## 2024-03-29 DIAGNOSIS — H409 Unspecified glaucoma: Secondary | ICD-10-CM | POA: Diagnosis present

## 2024-03-29 DIAGNOSIS — Z9011 Acquired absence of right breast and nipple: Secondary | ICD-10-CM

## 2024-03-29 DIAGNOSIS — F4381 Prolonged grief disorder: Secondary | ICD-10-CM | POA: Diagnosis present

## 2024-03-29 DIAGNOSIS — Z1152 Encounter for screening for COVID-19: Secondary | ICD-10-CM

## 2024-03-29 DIAGNOSIS — M1990 Unspecified osteoarthritis, unspecified site: Secondary | ICD-10-CM | POA: Diagnosis present

## 2024-03-29 DIAGNOSIS — E782 Mixed hyperlipidemia: Secondary | ICD-10-CM | POA: Diagnosis present

## 2024-03-29 DIAGNOSIS — Z90710 Acquired absence of both cervix and uterus: Secondary | ICD-10-CM

## 2024-03-29 DIAGNOSIS — I5032 Chronic diastolic (congestive) heart failure: Secondary | ICD-10-CM

## 2024-03-29 DIAGNOSIS — N183 Chronic kidney disease, stage 3 unspecified: Secondary | ICD-10-CM

## 2024-03-29 DIAGNOSIS — I10 Essential (primary) hypertension: Secondary | ICD-10-CM | POA: Diagnosis not present

## 2024-03-29 DIAGNOSIS — D84821 Immunodeficiency due to drugs: Secondary | ICD-10-CM | POA: Diagnosis present

## 2024-03-29 DIAGNOSIS — Z888 Allergy status to other drugs, medicaments and biological substances status: Secondary | ICD-10-CM

## 2024-03-29 DIAGNOSIS — J440 Chronic obstructive pulmonary disease with acute lower respiratory infection: Secondary | ICD-10-CM | POA: Diagnosis present

## 2024-03-29 DIAGNOSIS — I272 Pulmonary hypertension, unspecified: Secondary | ICD-10-CM | POA: Diagnosis present

## 2024-03-29 DIAGNOSIS — Z79631 Long term (current) use of antimetabolite agent: Secondary | ICD-10-CM

## 2024-03-29 DIAGNOSIS — Z9049 Acquired absence of other specified parts of digestive tract: Secondary | ICD-10-CM

## 2024-03-29 DIAGNOSIS — Z7984 Long term (current) use of oral hypoglycemic drugs: Secondary | ICD-10-CM

## 2024-03-29 DIAGNOSIS — Z923 Personal history of irradiation: Secondary | ICD-10-CM

## 2024-03-29 DIAGNOSIS — I4819 Other persistent atrial fibrillation: Secondary | ICD-10-CM | POA: Diagnosis present

## 2024-03-29 DIAGNOSIS — N1832 Chronic kidney disease, stage 3b: Secondary | ICD-10-CM | POA: Diagnosis present

## 2024-03-29 DIAGNOSIS — Z8261 Family history of arthritis: Secondary | ICD-10-CM

## 2024-03-29 DIAGNOSIS — E66813 Obesity, class 3: Secondary | ICD-10-CM | POA: Diagnosis present

## 2024-03-29 DIAGNOSIS — I080 Rheumatic disorders of both mitral and aortic valves: Secondary | ICD-10-CM | POA: Diagnosis present

## 2024-03-29 DIAGNOSIS — Z8614 Personal history of Methicillin resistant Staphylococcus aureus infection: Secondary | ICD-10-CM

## 2024-03-29 DIAGNOSIS — H44119 Panuveitis, unspecified eye: Secondary | ICD-10-CM | POA: Diagnosis present

## 2024-03-29 DIAGNOSIS — E876 Hypokalemia: Secondary | ICD-10-CM | POA: Diagnosis present

## 2024-03-29 DIAGNOSIS — N179 Acute kidney failure, unspecified: Secondary | ICD-10-CM | POA: Diagnosis present

## 2024-03-29 DIAGNOSIS — R21 Rash and other nonspecific skin eruption: Secondary | ICD-10-CM | POA: Diagnosis present

## 2024-03-29 DIAGNOSIS — Z853 Personal history of malignant neoplasm of breast: Secondary | ICD-10-CM

## 2024-03-29 DIAGNOSIS — R6520 Severe sepsis without septic shock: Secondary | ICD-10-CM | POA: Diagnosis present

## 2024-03-29 DIAGNOSIS — E785 Hyperlipidemia, unspecified: Secondary | ICD-10-CM | POA: Diagnosis present

## 2024-03-29 DIAGNOSIS — D631 Anemia in chronic kidney disease: Secondary | ICD-10-CM | POA: Diagnosis present

## 2024-03-29 DIAGNOSIS — Z170 Estrogen receptor positive status [ER+]: Secondary | ICD-10-CM

## 2024-03-29 DIAGNOSIS — I5033 Acute on chronic diastolic (congestive) heart failure: Secondary | ICD-10-CM | POA: Diagnosis present

## 2024-03-29 DIAGNOSIS — Z7960 Long term (current) use of unspecified immunomodulators and immunosuppressants: Secondary | ICD-10-CM

## 2024-03-29 DIAGNOSIS — G629 Polyneuropathy, unspecified: Secondary | ICD-10-CM | POA: Diagnosis present

## 2024-03-29 DIAGNOSIS — I130 Hypertensive heart and chronic kidney disease with heart failure and stage 1 through stage 4 chronic kidney disease, or unspecified chronic kidney disease: Secondary | ICD-10-CM | POA: Diagnosis present

## 2024-03-29 DIAGNOSIS — Z7901 Long term (current) use of anticoagulants: Secondary | ICD-10-CM

## 2024-03-29 DIAGNOSIS — I2609 Other pulmonary embolism with acute cor pulmonale: Secondary | ICD-10-CM | POA: Diagnosis present

## 2024-03-29 DIAGNOSIS — R6510 Systemic inflammatory response syndrome (SIRS) of non-infectious origin without acute organ dysfunction: Secondary | ICD-10-CM | POA: Diagnosis present

## 2024-03-29 DIAGNOSIS — C50311 Malignant neoplasm of lower-inner quadrant of right female breast: Secondary | ICD-10-CM | POA: Diagnosis present

## 2024-03-29 LAB — COMPREHENSIVE METABOLIC PANEL WITH GFR
ALT: 9 U/L (ref 0–44)
AST: 20 U/L (ref 15–41)
Albumin: 3.5 g/dL (ref 3.5–5.0)
Alkaline Phosphatase: 52 U/L (ref 38–126)
Anion gap: 12 (ref 5–15)
BUN: 23 mg/dL (ref 8–23)
CO2: 26 mmol/L (ref 22–32)
Calcium: 9 mg/dL (ref 8.9–10.3)
Chloride: 103 mmol/L (ref 98–111)
Creatinine, Ser: 1.48 mg/dL — ABNORMAL HIGH (ref 0.44–1.00)
GFR, Estimated: 37 mL/min — ABNORMAL LOW (ref >60)
Glucose, Bld: 100 mg/dL — ABNORMAL HIGH (ref 70–99)
Potassium: 4.6 mmol/L (ref 3.5–5.1)
Sodium: 141 mmol/L (ref 135–145)
Total Bilirubin: 1.6 mg/dL — ABNORMAL HIGH (ref 0.0–1.2)
Total Protein: 6.4 g/dL — ABNORMAL LOW (ref 6.5–8.1)

## 2024-03-29 LAB — CBC WITH DIFFERENTIAL/PLATELET
Abs Immature Granulocytes: 0.03 K/uL (ref 0.00–0.07)
Basophils Absolute: 0 K/uL (ref 0.0–0.1)
Basophils Relative: 0 %
Eosinophils Absolute: 0.3 K/uL (ref 0.0–0.5)
Eosinophils Relative: 4 %
HCT: 39 % (ref 36.0–46.0)
Hemoglobin: 11.9 g/dL — ABNORMAL LOW (ref 12.0–15.0)
Immature Granulocytes: 1 %
Lymphocytes Relative: 6 %
Lymphs Abs: 0.4 K/uL — ABNORMAL LOW (ref 0.7–4.0)
MCH: 29.9 pg (ref 26.0–34.0)
MCHC: 30.5 g/dL (ref 30.0–36.0)
MCV: 98 fL (ref 80.0–100.0)
Monocytes Absolute: 0.4 K/uL (ref 0.1–1.0)
Monocytes Relative: 6 %
Neutro Abs: 4.8 K/uL (ref 1.7–7.7)
Neutrophils Relative %: 83 %
Platelets: 188 K/uL (ref 150–400)
RBC: 3.98 MIL/uL (ref 3.87–5.11)
RDW: 15.1 % (ref 11.5–15.5)
WBC: 5.8 K/uL (ref 4.0–10.5)
nRBC: 0 % (ref 0.0–0.2)

## 2024-03-29 LAB — URINALYSIS, W/ REFLEX TO CULTURE (INFECTION SUSPECTED)
Bilirubin Urine: NEGATIVE
Glucose, UA: >=500 mg/dL — AB
Hgb urine dipstick: NEGATIVE
Ketones, ur: NEGATIVE mg/dL
Nitrite: NEGATIVE
Protein, ur: 30 mg/dL — AB
Specific Gravity, Urine: 1.021 (ref 1.005–1.030)
pH: 6 (ref 5.0–8.0)

## 2024-03-29 LAB — RESP PANEL BY RT-PCR (RSV, FLU A&B, COVID)  RVPGX2
Influenza A by PCR: NEGATIVE
Influenza B by PCR: NEGATIVE
Resp Syncytial Virus by PCR: NEGATIVE
SARS Coronavirus 2 by RT PCR: NEGATIVE

## 2024-03-29 LAB — APTT: aPTT: 39 s — ABNORMAL HIGH (ref 24–36)

## 2024-03-29 LAB — PROTIME-INR
INR: 1.7 — ABNORMAL HIGH (ref 0.8–1.2)
Prothrombin Time: 21 s — ABNORMAL HIGH (ref 11.4–15.2)

## 2024-03-29 LAB — CANCER ANTIGEN 27.29: CA 27.29: 10.7 U/mL (ref 0.0–38.6)

## 2024-03-29 LAB — I-STAT CG4 LACTIC ACID, ED
Lactic Acid, Venous: 3.1 mmol/L (ref 0.5–1.9)
Lactic Acid, Venous: 1.8 mmol/L (ref 0.5–1.9)

## 2024-03-29 LAB — MRSA NEXT GEN BY PCR, NASAL: MRSA by PCR Next Gen: NOT DETECTED

## 2024-03-29 MED ORDER — SODIUM CHLORIDE 0.9 % IV SOLN
500.0000 mg | Freq: Once | INTRAVENOUS | Status: AC
  Administered 2024-03-29: 500 mg via INTRAVENOUS
  Filled 2024-03-29: qty 5

## 2024-03-29 MED ORDER — RIVAROXABAN 10 MG PO TABS
20.0000 mg | ORAL_TABLET | Freq: Every day | ORAL | Status: DC
  Administered 2024-03-30 – 2024-04-06 (×8): 20 mg via ORAL
  Filled 2024-03-29 (×9): qty 2

## 2024-03-29 MED ORDER — METHYLPREDNISOLONE SODIUM SUCC 40 MG IJ SOLR
40.0000 mg | Freq: Two times a day (BID) | INTRAMUSCULAR | Status: DC
  Administered 2024-03-30: 40 mg via INTRAVENOUS
  Filled 2024-03-29: qty 1

## 2024-03-29 MED ORDER — DORZOLAMIDE HCL 2 % OP SOLN
1.0000 [drp] | Freq: Two times a day (BID) | OPHTHALMIC | Status: DC
  Administered 2024-03-30 – 2024-04-07 (×17): 1 [drp] via OPHTHALMIC
  Filled 2024-03-29: qty 10

## 2024-03-29 MED ORDER — LATANOPROST 0.005 % OP SOLN
1.0000 [drp] | Freq: Every day | OPHTHALMIC | Status: DC
Start: 2024-03-29 — End: 2024-04-07
  Administered 2024-03-30 – 2024-04-06 (×8): 1 [drp] via OPHTHALMIC
  Filled 2024-03-29: qty 2.5

## 2024-03-29 MED ORDER — SODIUM CHLORIDE 0.9 % IV SOLN
2.0000 g | Freq: Two times a day (BID) | INTRAVENOUS | Status: DC
  Administered 2024-03-29: 2 g via INTRAVENOUS
  Filled 2024-03-29: qty 12.5

## 2024-03-29 MED ORDER — FAMOTIDINE IN NACL 20-0.9 MG/50ML-% IV SOLN
20.0000 mg | Freq: Two times a day (BID) | INTRAVENOUS | Status: DC
  Administered 2024-03-29 – 2024-03-30 (×3): 20 mg via INTRAVENOUS
  Filled 2024-03-29 (×5): qty 50

## 2024-03-29 MED ORDER — ACETAMINOPHEN 325 MG PO TABS
650.0000 mg | ORAL_TABLET | Freq: Four times a day (QID) | ORAL | Status: DC | PRN
  Administered 2024-04-04 – 2024-04-06 (×5): 650 mg via ORAL
  Filled 2024-03-29 (×5): qty 2

## 2024-03-29 MED ORDER — SODIUM CHLORIDE 0.9 % IV BOLUS
1000.0000 mL | Freq: Once | INTRAVENOUS | Status: AC
  Administered 2024-03-29: 1000 mL via INTRAVENOUS

## 2024-03-29 MED ORDER — SODIUM CHLORIDE 0.9 % IV SOLN: 1.0000 g | Freq: Once | INTRAVENOUS | Status: DC

## 2024-03-29 MED ORDER — EPINEPHRINE 0.3 MG/0.3ML IJ SOAJ
0.3000 mg | Freq: Once | INTRAMUSCULAR | Status: AC
  Administered 2024-03-29: 0.3 mg via INTRAMUSCULAR
  Filled 2024-03-29: qty 0.3

## 2024-03-29 MED ORDER — DIPHENHYDRAMINE HCL 50 MG/ML IJ SOLN
12.5000 mg | Freq: Once | INTRAMUSCULAR | Status: AC
  Administered 2024-03-29: 12.5 mg via INTRAVENOUS
  Filled 2024-03-29: qty 1

## 2024-03-29 MED ORDER — ACETAMINOPHEN 650 MG RE SUPP: 650.0000 mg | Freq: Four times a day (QID) | RECTAL | Status: DC | PRN

## 2024-03-29 MED ORDER — CABERGOLINE 0.5 MG PO TABS
0.5000 mg | ORAL_TABLET | ORAL | Status: DC
  Administered 2024-03-31 – 2024-04-07 (×3): 0.5 mg via ORAL
  Filled 2024-03-29 (×5): qty 1

## 2024-03-29 MED ORDER — IOHEXOL 350 MG/ML SOLN
75.0000 mL | Freq: Once | INTRAVENOUS | Status: AC | PRN
  Administered 2024-03-29: 75 mL via INTRAVENOUS

## 2024-03-29 MED ORDER — METRONIDAZOLE 500 MG/100ML IV SOLN
500.0000 mg | Freq: Two times a day (BID) | INTRAVENOUS | Status: DC
  Administered 2024-03-29: 500 mg via INTRAVENOUS
  Filled 2024-03-29: qty 100

## 2024-03-29 MED ORDER — VANCOMYCIN HCL 1250 MG/250ML IV SOLN: 1250.0000 mg | INTRAVENOUS | Status: DC

## 2024-03-29 MED ORDER — VANCOMYCIN HCL 10 G IV SOLR
2500.0000 mg | Freq: Once | INTRAVENOUS | Status: AC
  Administered 2024-03-29: 2500 mg via INTRAVENOUS
  Filled 2024-03-29: qty 2500

## 2024-03-29 MED ORDER — SODIUM CHLORIDE 0.9 % IV SOLN
2.0000 g | Freq: Once | INTRAVENOUS | Status: AC
  Administered 2024-03-29: 2 g via INTRAVENOUS
  Filled 2024-03-29: qty 20

## 2024-03-29 MED ORDER — ALBUTEROL SULFATE (2.5 MG/3ML) 0.083% IN NEBU: 2.5000 mg | INHALATION_SOLUTION | RESPIRATORY_TRACT | Status: DC | PRN

## 2024-03-29 MED ORDER — DIPHENHYDRAMINE HCL 25 MG PO CAPS: 25.0000 mg | ORAL_CAPSULE | Freq: Four times a day (QID) | ORAL | Status: DC | PRN

## 2024-03-29 MED ORDER — SODIUM CHLORIDE 0.9% FLUSH: 10.0000 mL | INTRAVENOUS | Status: DC | PRN

## 2024-03-29 MED ORDER — ONDANSETRON HCL 4 MG PO TABS: 4.0000 mg | ORAL_TABLET | Freq: Four times a day (QID) | ORAL | Status: DC | PRN

## 2024-03-29 MED ORDER — ONDANSETRON HCL 4 MG/2ML IJ SOLN: 4.0000 mg | Freq: Four times a day (QID) | INTRAMUSCULAR | Status: DC | PRN

## 2024-03-29 MED ORDER — ACETAMINOPHEN 500 MG PO TABS
1000.0000 mg | ORAL_TABLET | Freq: Once | ORAL | Status: AC
  Administered 2024-03-29: 1000 mg via ORAL
  Filled 2024-03-29: qty 2

## 2024-03-29 MED ORDER — SODIUM CHLORIDE 0.9 % IV SOLN: INTRAVENOUS | Status: DC

## 2024-03-29 MED ORDER — METHYLPREDNISOLONE SODIUM SUCC 125 MG IJ SOLR
125.0000 mg | Freq: Once | INTRAMUSCULAR | Status: AC
  Administered 2024-03-29: 125 mg via INTRAVENOUS
  Filled 2024-03-29: qty 2

## 2024-03-29 NOTE — ED Notes (Signed)
Not in room, taken to CT

## 2024-03-29 NOTE — Assessment & Plan Note (Signed)
 Avoid over aggressive fluid overload

## 2024-03-29 NOTE — Assessment & Plan Note (Signed)
 Chronic stable

## 2024-03-29 NOTE — ED Provider Notes (Signed)
  Physical Exam  BP (!) 87/61   Pulse (!) 54   Temp 99.4 F (37.4 C) (Oral)   Resp 17   Wt 131.1 kg   SpO2 100%   BMI 52.86 kg/m   Physical Exam  Procedures  Procedures  ED Course / MDM    Medical Decision Making Care assumed at 3 PM.  Patient is here with chills.  Signout pending labs and chest x-ray and UA.   7 pm Patient drops her pressure to the 80s.  Lactate was 3.1 initially.  It cleared with 1 L bolus.  Chest x-ray showed possible pneumonia.  UA showed questionable UTI.  Ordered Rocephin and azithromycin.  Unclear why she is hypotensive.  She does have a history of breast cancer so we will get CT chest abdomen pelvis to rule out recurrent cancer versus pyelonephritis  8:43 PM CT chest abdomen pelvis is unremarkable.  Patient does have pulmonary hypertension on CT.  Patient's blood pressure is up to upper 80s to 90s after third liter bolus.  MAP has been greater than 65.  Patient does not need pressors for now.  Will admit to stepdown for severe sepsis with unclear etiology  Problems Addressed: Lactic acidosis: acute illness or injury Sepsis, due to unspecified organism, unspecified whether acute organ dysfunction present [facility]): acute illness or injury  Amount and/or Complexity of Data Reviewed Labs: ordered. Decision-making details documented in ED Course. Radiology: ordered and independent interpretation performed. Decision-making details documented in ED Course.  Risk OTC drugs. Prescription drug management. Decision regarding hospitalization.   CRITICAL CARE Performed by: Alm VEAR Cave   Total critical care time: 30 minutes  Critical care time was exclusive of separately billable procedures and treating other patients.  Critical care was necessary to treat or prevent imminent or life-threatening deterioration.  Critical care was time spent personally by me on the following activities: development of treatment plan with patient and/or surrogate as well as  nursing, discussions with consultants, evaluation of patient's response to treatment, examination of patient, obtaining history from patient or surrogate, ordering and performing treatments and interventions, ordering and review of laboratory studies, ordering and review of radiographic studies, pulse oximetry and re-evaluation of patient's condition.        Cave Alm Macho, MD 03/29/24 [phone]

## 2024-03-29 NOTE — Subjective & Objective (Signed)
 Hx of breast cancer in remission had low grade fever 99 chills and mild cough  She was  hypotensive in 80 lactic acid up to 3.1 improved with IV fluids Has been eating and drinking well CXR showed PNA initially but CT did not confirm BP somewhat improved with IV fluids

## 2024-03-29 NOTE — Progress Notes (Signed)
 Pharmacy Antibiotic Note  [name] is a 73 y.o. female admitted on 03/29/2024 with sepsis.  Pharmacy has been consulted for Vancomycin and cefepime dosing.  Plan: Vancomycin 2500 mg loading dose IV once, then 1250 mg q24h.  Cefepime 2g q12h  Weight: 131.1 kg (289 lb)  Temp (24hrs), Avg:98.4 F (36.9 C), Min:98 F (36.7 C), Max:99.4 F (37.4 C)  Recent Labs  Lab 03/28/24 0825 03/29/24 1346 03/29/24 1446 03/29/24 1829  WBC 4.4 5.8  --   --   CREATININE 1.60* 1.48*  --   --   LATICACIDVEN  --   --  3.1* 1.8    Estimated Creatinine Clearance: 42.8 mL/min (A) (by C-G formula based on SCr of 1.48 mg/dL (H)).    Allergies  Allergen Reactions   Trandolapril-Verapamil Hcl Er Swelling    Causes lips to swell [name] is the name brand) Tolerates amlodipine     Antimicrobials this admission: Ceftriaxone 11/4 x 1 dose  Azithromycin 11/4 x 1 dose  Flagyl 11/4 >>    Microbiology results: 11/4 BCx:  11/4 UCx:   11/4 Sputum:  11/4 MRSA PCR:   Thank you for allowing pharmacy to be a part of this patient's care.  Deejay Koppelman M Rahmir Beever 03/29/2024 9:50 PM

## 2024-03-29 NOTE — ED Provider Notes (Signed)
 [place] EMERGENCY DEPARTMENT AT [facility] Provider Note   CSN: [id number] Arrival date & time: 03/29/24  1336     Patient presents with: Chills   [name] is a 73 y.o. female.   HPI Patient presents with chills.  Reportedly started today.  No definite reason for it.  No cough.  No shortness of breath.  No nausea or vomiting.  No diarrhea.  No dysuria.  History of breast cancer.  However does appear to have Imuran on her med list.  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  Does have some redness on her arms.  No pain on her arms.   Past Medical History:  Diagnosis Date   Arthritis    Asthma    Blood transfusion    hx of last one in 1987   Breast cancer [facility]) 2013   Right Breast Cancer   Cancer of lower-inner quadrant of female breast (HCC) 07/24/2011   right breast cancer /IDC,stage IIB,er/pr=+,her2=Neg   Glaucoma    Heart murmur    History of chemotherapy    taxotere /cytoxan  6 cycles 09/22/11-01/05/12 day 2 neulasta    Hyperlipemia    Hypertension    Lymphedema    right arm wears a sleeve   MRSA (methicillin resistant staph aureus) culture positive    08/2011   Neuromuscular disorder (HCC)    peripheral neuropathy hands feet   OSA (obstructive sleep apnea)    CPAP settings- ?    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 Cabbell follows    Prior to Admission medications   Medication Sig Start Date End Date Taking? Authorizing Provider  acetaminophen  (TYLENOL ) 325 MG tablet Take 325-650 mg by mouth every 6 (six) hours as needed (for pain.).    [provider]  acetaZOLAMIDE (DIAMOX) 250 MG tablet Take 500 mg by mouth 2 (two) times daily.    [provider]  albuterol  (PROAIR  HFA) 108 (90 Base) MCG/ACT inhaler Inhale 1-2 puffs into the lungs every 6 (six) hours  as needed for wheezing or shortness of breath. For shortness of breath 03/28/20   Parrett, Madelin RAMAN, NP  azaTHIOprine (IMURAN) 50 MG tablet Take 100 mg by mouth daily.    [provider]  cabergoline  (DOSTINEX ) 0.5 MG tablet Take 0.5 mg by mouth 2 (two) times a week. Tuesday and Thursday    [provider]  chlorpheniramine-HYDROcodone  (TUSSIONEX) 10-8 MG/5ML Take 5 mLs by mouth every 12 (twelve) hours as needed for cough. 05/23/22   Molpus, John, MD  Cholecalciferol  (VITAMIN D3) 1000 UNITS CAPS Take 1,000 Units by mouth every other day.     [provider]  dorzolamide (TRUSOPT) 2 % ophthalmic solution Place 1 drop into both eyes 2 (two) times daily. 09/09/21   [provider]  empagliflozin  (JARDIANCE ) 10 MG TABS tablet Take 1 tablet (10 mg total) by mouth daily. 03/04/23   Lucien Orren SAILOR, PA-C  GLUCOSAMINE HCL PO Take 15 mg by mouth daily.    [provider]  latanoprost  (XALATAN ) 0.005 % ophthalmic solution Place 1 drop into both eyes at bedtime. 09/04/21   [provider]  metoprolol  succinate (TOPROL -XL) 50 MG 24 hr tablet Take 1.5 tablets (75 mg total) by mouth 2 (two) times daily. 03/07/24   Rolan Ezra RAMAN, MD  Multiple Vitamin CLEAVE WITH  MINERALS) TABS Take 1 tablet by mouth daily with breakfast.     [provider]  potassium chloride  SA (KLOR-CON  M) 20 MEQ tablet Take 2 tablets (40 mEq total) by mouth daily. 04/02/23   Hayes Beckey CROME, NP  prednisoLONE acetate (PRED FORTE) 1 % ophthalmic suspension Place 1 drop into the right eye daily. 09/09/21   [provider]  rivaroxaban  (XARELTO ) 20 MG TABS tablet Take 1 tablet (20 mg total) by mouth daily with supper. 03/04/23   Lucien Orren SAILOR, PA-C  simvastatin  (ZOCOR ) 20 MG tablet TAKE 1 TABLET AT BEDTIME 10/07/19   Parthenia Olivia HERO, PA-C  spironolactone  (ALDACTONE ) 25 MG tablet Take 1 tablet (25 mg total) by mouth daily. 11/06/21   Milford, Harlene HERO, FNP  torsemide  (DEMADEX )  20 MG tablet Take 3 tablets (60 mg total) by mouth daily. 04/02/23   Hayes Beckey CROME, NP  vitamin B-12 (CYANOCOBALAMIN ) 500 MCG tablet Take 500 mcg by mouth daily.    [provider]    Allergies: Trandolapril-verapamil hcl er    Review of Systems  Updated Vital Signs BP 113/63   Pulse 72   Temp 98.1 F (36.7 C) (Oral)   Resp (!) 23   Wt 131.1 kg   SpO2 100%   BMI 52.86 kg/m   Physical Exam Vitals and nursing note reviewed.  HENT:     Head: Normocephalic.  Cardiovascular:     Rate and Rhythm: Regular rhythm.  Pulmonary:     Breath sounds: No wheezing.  Abdominal:     Tenderness: There is no abdominal tenderness.  Musculoskeletal:     Right lower leg: Edema present.     Left lower leg: Edema present.  Skin:    Comments: Some erythema on bilateral upper extremities.  No induration.  No fluctuance.  Does have edema bilateral lower extremities without definite skin changes.  Neurological:     Mental Status: She is alert and oriented to person, place, and time.     (all labs ordered are listed, but only abnormal results are displayed) Labs Reviewed  CBC WITH DIFFERENTIAL/PLATELET - Abnormal; Notable for the following components:      Result Value   Hemoglobin 11.9 (*)    Lymphs Abs 0.4 (*)    All other components within normal limits  I-STAT CG4 LACTIC ACID, ED - Abnormal; Notable for the following components:   Lactic Acid, Venous 3.1 (*)    All other components within normal limits  CULTURE, BLOOD (ROUTINE X 2)  CULTURE, BLOOD (ROUTINE X 2)  COMPREHENSIVE METABOLIC PANEL WITH GFR  URINALYSIS, W/ REFLEX TO CULTURE (INFECTION SUSPECTED)  I-STAT CG4 LACTIC ACID, ED    EKG: None  Radiology: DG Chest Portable 1 View Result Date: 03/29/2024 CLINICAL DATA:  Chills. EXAM: PORTABLE CHEST 1 VIEW COMPARISON:  Chest radiograph dated 05/22/2022. FINDINGS: Cardiomegaly with vascular congestion. Right mid to lower lung field interstitial densities may represent  congestion or infiltrate. No focal consolidation, pleural effusion, pneumothorax. No acute osseous pathology. IMPRESSION: Cardiomegaly with vascular congestion. Superimposed pneumonia in the right lung is not excluded. Electronically Signed   By: Vanetta Chou M.D.   On: 03/29/2024 14:36     Procedures   Medications Ordered in the ED  azithromycin (ZITHROMAX) 500 mg in sodium chloride  0.9 % 250 mL IVPB (has no administration in time range)  sodium chloride  0.9 % bolus 1,000 mL (has no administration in time range)  cefTRIAXone (ROCEPHIN) 2 g in sodium chloride  0.9 % 100 mL  IVPB (has no administration in time range)                                    Medical Decision Making Amount and/or Complexity of Data Reviewed Labs: ordered. Radiology: ordered.   Patient with chills.  No frank fever.  However with her immunosuppression potentially could have more occult infections.  Will get blood work.  Blood pressure mildly low but will repeat.  Reviewed recent oncology note.  Had been seen yesterday.  X-ray does show possible pneumonia.  Although not having cough with the chills and immunosuppression will give empiric antibiotics at this time.  Initial lactic acid mildly elevated.  Blood pressure has improved on its own.  Care turned over to Dr.Yao.     Final diagnoses:  None    ED Discharge Orders     None          Patsey Lot, MD 03/29/24 [phone]

## 2024-03-29 NOTE — ED Triage Notes (Signed)
 BIB [facility] from home for chills. Denies other sx. Denies cough, NVD, sob, or sore thraot. House smelled of UTI per EMS. VSS. HR afib 80, 110/66, RR 20. CBG 166. Pt alert, NAD, calm, interactive, resps e/u, speaking in clear complete sentences, skin W&D. R arm restricted 2/2 breast CA. Temp on arrival 98.1 oral.

## 2024-03-29 NOTE — H&P (Signed)
 [name] FMW:[id number] DOB: 04/26/1951 DOA: 03/29/2024     PCP: Linnie Thresa DELENA, MD   Outpatient Specialists: * NONE CARDS: * Dr. None  NEphrology: *  Dr. No care team member to display  NEurology *   Dr. Pulmonary *  Dr.  Oncology * Dr.No care team member to display  GI* Dr.  Gwen, LB) No care team member to display Urology Dr. *  Patient arrived to ER on 03/29/24 at 1336 Referred by Attending Patt Alm Macho, MD   Patient coming from:    home Lives  With family    Chief Complaint:   Chief Complaint  Patient presents with   Chills    HPI: [name] is a 73 y.o. female with medical history significant of breast cancer, asthma, HLD, HTN, A.fib on xarelto ,     Presented with  chills, cough Hx of breast cancer in remission had low grade fever 99 chills and mild cough  She was  hypotensive in 80 lactic acid up to 3.1 improved with IV fluids Has been eating and drinking well CXR showed PNA initially but CT did not confirm BP somewhat improved with IV fluids     Reports chills since today, 2 days felt itchy all over so she took some benadryl  No recent change in medication, no new foods No change in lotions or detergents    Denies significant ETOH intake   Does not smoke      Regarding pertinent Chronic problems:    Hyperlipidemia -  on statins Zocor  (simvastatin )  Lipid Panel     Component Value Date/Time   CHOL 182 10/15/2018 1047   TRIG 66 10/15/2018 1047   HDL 64 10/15/2018 1047   CHOLHDL 2.8 10/15/2018 1047   LDLCALC 105 (H) 10/15/2018 1047   LABVLDL 13 10/15/2018 1047     HTN on toprol , spironolactone    chronic CHF diastolic  - last echo  Recent Results (from the past 56199 hours)  ECHOCARDIOGRAM COMPLETE   Collection Time: 11/16/23 11:35 AM  Result Value   Area-P 1/2 3.38   S' Lateral 2.60   P 1/2 time 923   Radius 0.80   MV M vel 4.45   MV Peak grad 79.2   Est EF 70 - 75%   Narrative      ECHOCARDIOGRAM REPORT         IMPRESSIONS    1. Left ventricular ejection fraction, by estimation, is 70 to 75%. The left ventricle has hyperdynamic function. The left ventricle has no regional wall motion abnormalities. Left ventricular diastolic parameters are indeterminate.  2. Right ventricular systolic function is normal. The right ventricular size is normal. There is normal pulmonary artery systolic pressure. The estimated right ventricular systolic pressure is 31.5 mmHg.  3. Left atrial size was severely dilated.  4. The mitral valve is normal in structure. Moderate mitral valve regurgitation. No evidence of mitral stenosis.  5. The aortic valve was not well visualized. Aortic valve regurgitation is moderate. No aortic stenosis is present.  6. Aortic dilatation noted. There is mild dilatation of the ascending aorta, measuring 40 mm.  Comparison(s): Prior images reviewed side by side. RVSP has improved; otherwise no significant change.             Morbid obesity-   BMI Readings from Last 1 Encounters:  03/29/24 52.86 kg/m      Asthma -well   controlled on home inhalers/ nebs  OSA -on nocturnal CPAP,      A. Fib -   atrial fibrillation CHA2DS2 vas score    5        current  on anticoagulation with   Xarelto       -  Rate control:  Currently controlled with  Toprolol,        CKD stage IIIb baseline Cr 1.5 Estimated Creatinine Clearance: 42.8 mL/min (A) (by C-G formula based on SCr of 1.48 mg/dL (H)).  Lab Results  Component Value Date   CREATININE 1.48 (H) 03/29/2024   CREATININE 1.60 (H) 03/28/2024   CREATININE 1.46 (H) 10/07/2023   Lab Results  Component Value Date   NA 141 03/29/2024   CL 103 03/29/2024   K 4.6 03/29/2024   CO2 26 03/29/2024   BUN 23 03/29/2024   CREATININE 1.48 (H) 03/29/2024   GFRNONAA 37 (L) 03/29/2024   CALCIUM 9.0 03/29/2024   ALBUMIN 3.5 03/29/2024   GLUCOSE 100 (H) 03/29/2024     While in ER:         Lab Orders          Culture, blood (routine x 2)         Comprehensive metabolic panel         CBC with Differential         Urinalysis, w/ Reflex to Culture (Infection Suspected) -Urine, Clean Catch         I-Stat Lactic Acid, ED       CXR - Cardiomegaly      CTA chest -  1. No acute abnormality of the chest, abdomen, or pelvis to explain sepsis. 2. Dilated main pulmonary artery measuring 3.9 cm, which can be seen with pulmonary hypertension.  Following Medications were ordered in ER: Medications  azithromycin (ZITHROMAX) 500 mg in sodium chloride  0.9 % 250 mL IVPB (0 mg Intravenous Stopped 03/29/24 1721)  sodium chloride  0.9 % bolus 1,000 mL (0 mLs Intravenous Stopped 03/29/24 1821)  cefTRIAXone (ROCEPHIN) 2 g in sodium chloride  0.9 % 100 mL IVPB (0 g Intravenous Stopped 03/29/24 1721)  acetaminophen  (TYLENOL ) tablet 1,000 mg (1,000 mg Oral Given 03/29/24 1825)  sodium chloride  0.9 % bolus 1,000 mL (1,000 mLs Intravenous New Bag/Given 03/29/24 1903)  iohexol (OMNIPAQUE) 350 MG/ML injection 75 mL (75 mLs Intravenous Contrast Given 03/29/24 1855)  sodium chloride  0.9 % bolus 1,000 mL (1,000 mLs Intravenous New Bag/Given 03/29/24 1959)  diphenhydrAMINE  (BENADRYL ) injection 12.5 mg (12.5 mg Intravenous Given 03/29/24 2054)        ED Triage Vitals  Encounter Vitals Group     BP 03/29/24 1348 (!) 110/57     Girls Systolic BP Percentile --      Girls Diastolic BP Percentile --      Boys Systolic BP Percentile --      Boys Diastolic BP Percentile --      Pulse Rate 03/29/24 1345 77     Resp 03/29/24 1348 20     Temp 03/29/24 1348 98.1 F (36.7 C)     Temp Source 03/29/24 1348 Oral     SpO2 03/29/24 1345 94 %     Weight 03/29/24 1349 289 lb (131.1 kg)     Height --      Head Circumference --      Peak Flow --      Pain Score 03/29/24 1349 0     Pain Loc --      Pain Education --      Exclude from  Growth Chart --   UFJK(75)@     _________________________________________ Significant initial   Findings: Abnormal Labs Reviewed  COMPREHENSIVE METABOLIC PANEL WITH GFR - Abnormal; Notable for the following components:      Result Value   Glucose, Bld 100 (*)    Creatinine, Ser 1.48 (*)    Total Protein 6.4 (*)    Total Bilirubin 1.6 (*)    GFR, Estimated 37 (*)    All other components within normal limits  CBC WITH DIFFERENTIAL/PLATELET - Abnormal; Notable for the following components:   Hemoglobin 11.9 (*)    Lymphs Abs 0.4 (*)    All other components within normal limits  URINALYSIS, W/ REFLEX TO CULTURE (INFECTION SUSPECTED) - Abnormal; Notable for the following components:   Color, Urine AMBER (*)    APPearance HAZY (*)    Glucose, UA >=500 (*)    Protein, ur 30 (*)    Leukocytes,Ua MODERATE (*)    Bacteria, UA RARE (*)    All other components within normal limits  I-STAT CG4 LACTIC ACID, ED - Abnormal; Notable for the following components:   Lactic Acid, Venous 3.1 (*)    All other components within normal limits      _________________________ Troponin ***ordered Cardiac Panel (last 3 results) No results for input(s): CKTOTAL, CKMB, TROPONINIHS, RELINDX in the last 72 hours.   ECG: Ordered Personally reviewed and interpreted by me showing: HR : *** Rhythm: *NSR, Sinus tachycardia * A.fib. W RVR, RBBB, LBBB, Paced Ischemic changes*nonspecific changes, no evidence of ischemic changes QTC*  BNP (last 3 results) Recent Labs    04/02/23 0928 08/04/23 0945  BNP 218.3* 185.7*       ____________________ This patient meets SIRS Criteria and may be septic.   The recent clinical data is shown below. Vitals:   03/29/24 1925 03/29/24 2015 03/29/24 2054 03/29/24 2056  BP: (!) 87/61 (!) 93/58 (!) 93/54   Pulse: (!) 54 92 63   Resp: 17 (!) 22 (!) 22   Temp:    98.2 F (36.8 C)  TempSrc:    Oral  SpO2: 100% 100% 100%   Weight:        WBC     Component Value Date/Time   WBC 5.8 03/29/2024 1346   LYMPHSABS 0.4 (L) 03/29/2024 1346   LYMPHSABS 2.4  02/20/2017 1015   MONOABS 0.4 03/29/2024 1346   MONOABS 0.5 02/20/2017 1015   EOSABS 0.3 03/29/2024 1346   EOSABS 0.2 02/20/2017 1015   BASOSABS 0.0 03/29/2024 1346   BASOSABS 0.1 02/20/2017 1015      Lactic Acid, Venous    Component Value Date/Time   LATICACIDVEN 1.8 03/29/2024 1829      Procalcitonin   Ordered      UA   no evidence of UTI bacteria in urine    Urine analysis:    Component Value Date/Time   COLORURINE AMBER (A) 03/29/2024 1748   APPEARANCEUR HAZY (A) 03/29/2024 1748   LABSPEC 1.021 03/29/2024 1748   PHURINE 6.0 03/29/2024 1748   GLUCOSEU >=500 (A) 03/29/2024 1748   HGBUR NEGATIVE 03/29/2024 1748   BILIRUBINUR NEGATIVE 03/29/2024 1748   KETONESUR NEGATIVE 03/29/2024 1748   PROTEINUR 30 (A) 03/29/2024 1748   NITRITE NEGATIVE 03/29/2024 1748   LEUKOCYTESUR MODERATE (A) 03/29/2024 1748     ABX started Antibiotics Given (last 72 hours)     Date/Time Action Medication Dose Rate   03/29/24 1616 New Bag/Given   cefTRIAXone (ROCEPHIN) 2 g in sodium chloride  0.9 %  100 mL IVPB 2 g 200 mL/hr   03/29/24 1619 New Bag/Given   azithromycin (ZITHROMAX) 500 mg in sodium chloride  0.9 % 250 mL IVPB 500 mg 250 mL/hr        No results found for the last 90 days.    ________________________________________________________________  Arterial ***Venous  Blood Gas result:  pH *** pCO2 ***; pO2 ***;     %O2 Sat ***.  ABG    Component Value Date/Time   HCO3 27.3 08/28/2021 1408   HCO3 27.8 08/28/2021 1408   TCO2 29 08/28/2021 1408   TCO2 29 08/28/2021 1408   O2SAT 65 08/28/2021 1408   O2SAT 67 08/28/2021 1408       __________________________________________________________ Recent Labs  Lab 03/28/24 0825 03/29/24 1346  NA 139 141  K 4.3 4.6  CO2 29 26  GLUCOSE 83 100*  BUN 25* 23  CREATININE 1.60* 1.48*  CALCIUM 9.2 9.0    Cr   stable,  Lab Results  Component Value Date   CREATININE 1.48 (H) 03/29/2024   CREATININE 1.60 (H) 03/28/2024    CREATININE 1.46 (H) 10/07/2023    Recent Labs  Lab 03/28/24 0825 03/29/24 1346  AST 12* 20  ALT <5 9  ALKPHOS 60 52  BILITOT 1.4* 1.6*  PROT 6.8 6.4*  ALBUMIN 4.1 3.5   Lab Results  Component Value Date   CALCIUM 9.0 03/29/2024    Plt: Lab Results  Component Value Date   PLT 188 03/29/2024    Recent Labs  Lab 03/28/24 0825 03/29/24 1346  WBC 4.4 5.8  NEUTROABS 2.7 4.8  HGB 11.3* 11.9*  HCT 35.0* 39.0  MCV 94.3 98.0  PLT 175 188    HG/HCT  stable,       Component Value Date/Time   HGB 11.9 (L) 03/29/2024 1346   HGB 11.3 (L) 03/28/2024 0825   HGB 12.8 10/15/2018 1047   HGB 12.4 02/20/2017 1015   HCT 39.0 03/29/2024 1346   HCT 36.1 11/15/2020 0842   HCT 37.2 02/20/2017 1015   MCV 98.0 03/29/2024 1346   MCV 93 10/15/2018 1047   MCV 88.8 02/20/2017 1015     _______________________________________________ Hospitalist was called for admission for   Sepsis, due to unspecified organism,       The following Work up has been ordered so far:  Orders Placed This Encounter  Procedures   Culture, blood (routine x 2)   DG Chest Portable 1 View   CT CHEST ABDOMEN PELVIS W CONTRAST   Comprehensive metabolic panel   CBC with Differential   Urinalysis, w/ Reflex to Culture (Infection Suspected) -Urine, Clean Catch   Measure blood pressure   Re-check Vital Signs   Consult for Unassigned Medical Admission   I-Stat Lactic Acid, ED     OTHER Significant initial  Findings:  labs showing:     DM  labs:  HbA1C: No results for input(s): HGBA1C in the last 8760 hours.     CBG (last 3)  No results for input(s): GLUCAP in the last 72 hours.        Cultures: No results found for: SDES, SPECREQUEST, CULT, REPTSTATUS   Radiological Exams on Admission: CT CHEST ABDOMEN PELVIS W CONTRAST Result Date: 03/29/2024 EXAM: CT CHEST, ABDOMEN AND PELVIS WITH CONTRAST 03/29/2024 06:54:44 PM TECHNIQUE: CT of the chest, abdomen and pelvis was performed with the  administration of 75 mL of iohexol (OMNIPAQUE) 350 MG/ML injection. Multiplanar reformatted images are provided for review. Automated exposure control, iterative reconstruction, and/or  weight based adjustment of the mA/kV was utilized to reduce the radiation dose to as low as reasonably achievable. COMPARISON: None available. CLINICAL HISTORY: Sepsis. FINDINGS: CHEST: MEDIASTINUM AND LYMPH NODES: Mild cardiomegaly. Main pulmonary artery dilated at 3.9 cm. Scattered coronary artery and aortic atherosclerosis. Pericardium is unremarkable. The central airways are clear. No mediastinal, hilar or axillary lymphadenopathy. LUNGS AND PLEURA: No focal consolidation or pulmonary edema. No pleural effusion or pneumothorax. ABDOMEN AND PELVIS: LIVER: The liver is unremarkable. GALLBLADDER AND BILE DUCTS: Gallbladder is unremarkable. No biliary ductal dilatation. SPLEEN: No acute abnormality. PANCREAS: No acute abnormality. ADRENAL GLANDS: No acute abnormality. KIDNEYS, URETERS AND BLADDER: No stones in the kidneys or ureters. No hydronephrosis. No perinephric or periureteral stranding. Urinary bladder is unremarkable. GI AND BOWEL: Stomach demonstrates no acute abnormality. There is no bowel obstruction. REPRODUCTIVE ORGANS: Prior hysterectomy. PERITONEUM AND RETROPERITONEUM: No ascites. No free air. VASCULATURE: Aorta is normal in caliber. Aortic atherosclerosis. ABDOMINAL AND PELVIS LYMPH NODES: No lymphadenopathy. BONES AND SOFT TISSUES: No acute osseous abnormality. Prior right mastectomy. No other focal soft tissue abnormality. IMPRESSION: 1. No acute abnormality of the chest, abdomen, or pelvis to explain sepsis. 2. Dilated main pulmonary artery measuring 3.9 cm, which can be seen with pulmonary hypertension. Electronically signed by: Franky Crease MD 03/29/2024 07:03 PM EST RP Workstation: [id number]   DG Chest Portable 1 View Result Date: 03/29/2024 CLINICAL DATA:  Chills. EXAM: PORTABLE CHEST 1 VIEW COMPARISON:   Chest radiograph dated 05/22/2022. FINDINGS: Cardiomegaly with vascular congestion. Right mid to lower lung field interstitial densities may represent congestion or infiltrate. No focal consolidation, pleural effusion, pneumothorax. No acute osseous pathology. IMPRESSION: Cardiomegaly with vascular congestion. Superimposed pneumonia in the right lung is not excluded. Electronically Signed   By: Vanetta Chou M.D.   On: 03/29/2024 14:36   _______________________________________________________________________________________________________ Latest  Blood pressure (!) 93/54, pulse 63, temperature 98.2 F (36.8 C), temperature source Oral, resp. rate (!) 22, weight 131.1 kg, SpO2 100%.   Vitals  labs and radiology finding personally reviewed  Review of Systems:    Pertinent positives include:  , Fevers, chills, fatigue,  Constitutional:  No weight loss, night sweats weight loss  HEENT:  No headaches, Difficulty swallowing,Tooth/dental problems,Sore throat,  No sneezing, itching, ear ache, nasal congestion, post nasal drip,  Cardio-vascular:  No chest pain, Orthopnea, PND, anasarca, dizziness, palpitations.no Bilateral lower extremity swelling  GI:  No heartburn, indigestion, abdominal pain, nausea, vomiting, diarrhea, change in bowel habits, loss of appetite, melena, blood in stool, hematemesis Resp:  no shortness of breath at rest. No dyspnea on exertion, No excess mucus, no productive cough, No non-productive cough, No coughing up of blood.No change in color of mucus.No wheezing. Skin:  no rash or lesions. No jaundice GU:  no dysuria, change in color of urine, no urgency or frequency. No straining to urinate.  No flank pain.  Musculoskeletal:  No joint pain or no joint swelling. No decreased range of motion. No back pain.  Psych:  No change in mood or affect. No depression or anxiety. No memory loss.  Neuro: no localizing neurological complaints, no tingling, no weakness, no double  vision, no gait abnormality, no slurred speech, no confusion  All systems reviewed and apart from HOPI all are negative _______________________________________________________________________________________________ Past Medical History:   Past Medical History:  Diagnosis Date   Arthritis    Asthma    Blood transfusion    hx of last one in 1987   Breast cancer [facility]) 2013   Right  Breast Cancer   Cancer of lower-inner quadrant of female breast (HCC) 07/24/2011   right breast cancer /IDC,stage IIB,er/pr=+,her2=Neg   Glaucoma    Heart murmur    History of chemotherapy    taxotere /cytoxan  6 cycles 09/22/11-01/05/12 day 2 neulasta    Hyperlipemia    Hypertension    Lymphedema    right arm wears a sleeve   MRSA (methicillin resistant staph aureus) culture positive    08/2011   Neuromuscular disorder (HCC)    peripheral neuropathy hands feet   OSA (obstructive sleep apnea)    CPAP settings- ?    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 Gillie follows      Past Surgical History:  Procedure Laterality Date   ABDOMINAL HYSTERECTOMY  2007   TAH/BSO   BREAST SURGERY     mastectomy RIGHT   BUBBLE STUDY  09/20/2021   Procedure: BUBBLE STUDY;  Surgeon: Rolan Ezra RAMAN, MD;  Location[facility] ENDOSCOPY;  Service: Cardiovascular;;   CARDIOVERSION N/A 06/02/2017   Procedure: CARDIOVERSION;  Surgeon: Maranda Leim DEL, MD;  Location: [facility] ENDOSCOPY;  Service: Cardiovascular;  Laterality: N/A;   CARPAL TUNNEL RELEASE Right    2012   CHOLECYSTECTOMY  1987   open - Dr Buford   COLONOSCOPY N/A 06/13/2013   Procedure: COLONOSCOPY;  Surgeon: Renaye Sous, MD;  Location: [facility] ENDOSCOPY;  Service: Endoscopy;  Laterality: N/A;   DILATION AND CURETTAGE OF UTERUS     x3   EYE SURGERY     laser eye surgery for glaucoma   MASTECTOMY MODIFIED RADICAL Right  08/14/11   right , ER/PR +, HER2 -   port-a-cath insertion     port-a-cath removal 1'14   PORT-A-CATH REMOVAL  06/22/2012   Procedure: MINOR REMOVAL PORT-A-CATH;  Surgeon: Sherlean JINNY Laughter, MD;  Location: [facility];  Service: General;  Laterality: Left;   PORTACATH PLACEMENT  09/11/2011   Procedure: INSERTION PORT-A-CATH;  Surgeon: Debby LABOR. Cornett, MD;  Location: [facility] ORS;  Service: General;  Laterality: N/A;  Insert of Port   RIGHT HEART CATH N/A 08/28/2021   Procedure: RIGHT HEART CATH;  Surgeon: Rolan Ezra RAMAN, MD;  Location: [facility] INVASIVE CV LAB;  Service: Cardiovascular;  Laterality: N/A;   TEE WITHOUT CARDIOVERSION N/A 09/20/2021   Procedure: TRANSESOPHAGEAL ECHOCARDIOGRAM (TEE);  Surgeon: Rolan Ezra RAMAN, MD;  Location: [facility] ENDOSCOPY;  Service: Cardiovascular;  Laterality: N/A;   TUBAL LIGATION      Social History:  Ambulatory *** independently cane, walker  wheelchair bound, bed bound     reports that she has never smoked. She has never used smokeless tobacco. She reports that she does not drink alcohol  and does not use drugs.     Family History:   Family History  Problem Relation Age of Onset   Arthritis Mother    Cancer Father 66       prostate ca   Breast cancer Neg Hx    ______________________________________________________________________________________________ Allergies: Allergies  Allergen Reactions   Trandolapril-Verapamil Hcl Er Swelling    Causes lips to swell [name] is the name brand) Tolerates amlodipine      Prior to Admission medications   Medication Sig Start Date End Date Taking? Authorizing Provider  acetaminophen  (TYLENOL ) 325 MG tablet Take 325-650 mg by mouth every 6 (six) hours as needed (for pain.).    [provider]  acetaZOLAMIDE (DIAMOX) 250 MG tablet Take 500 mg by mouth 2 (two) times daily.    [provider]  albuterol  (PROAIR  HFA) 108 (90 Base) MCG/ACT inhaler Inhale 1-2 puffs into the lungs every 6  (six) hours as needed for wheezing or shortness of breath. For shortness of breath 03/28/20   Parrett, Madelin RAMAN, NP  azaTHIOprine (IMURAN) 50 MG tablet Take 100 mg by mouth daily.    [provider]  cabergoline  (DOSTINEX ) 0.5 MG tablet Take 0.5 mg by mouth 2 (two) times a week. Tuesday and Thursday    [provider]  chlorpheniramine-HYDROcodone  (TUSSIONEX) 10-8 MG/5ML Take 5 mLs by mouth every 12 (twelve) hours as needed for cough. 05/23/22   Molpus, John, MD  Cholecalciferol  (VITAMIN D3) 1000 UNITS CAPS Take 1,000 Units by mouth every other day.     [provider]  dorzolamide (TRUSOPT) 2 % ophthalmic solution Place 1 drop into both eyes 2 (two) times daily. 09/09/21   [provider]  empagliflozin  (JARDIANCE ) 10 MG TABS tablet Take 1 tablet (10 mg total) by mouth daily. 03/04/23   Lucien Orren SAILOR, PA-C  GLUCOSAMINE HCL PO Take 15 mg by mouth daily.    [provider]  latanoprost  (XALATAN ) 0.005 % ophthalmic solution Place 1 drop into both eyes at bedtime. 09/04/21   [provider]  metoprolol  succinate (TOPROL -XL) 50 MG 24 hr tablet Take 1.5 tablets (75 mg total) by mouth 2 (two) times daily. 03/07/24   Rolan Ezra RAMAN, MD  Multiple Vitamin (MULITIVITAMIN WITH MINERALS) TABS Take 1 tablet by mouth daily with breakfast.     [provider]  potassium chloride  SA (KLOR-CON  M) 20 MEQ tablet Take 2 tablets (40 mEq total) by mouth daily. 04/02/23   Hayes Beckey CROME, NP  prednisoLONE acetate (PRED FORTE) 1 % ophthalmic suspension Place 1 drop into the right eye daily. 09/09/21   [provider]  rivaroxaban  (XARELTO ) 20 MG TABS tablet Take 1 tablet (20 mg total) by mouth daily with supper. 03/04/23   Lucien Orren SAILOR, PA-C  simvastatin  (ZOCOR ) 20 MG tablet TAKE 1 TABLET AT BEDTIME 10/07/19   Parthenia Olivia HERO, PA-C  spironolactone  (ALDACTONE ) 25 MG tablet Take 1 tablet (25 mg total) by mouth daily. 11/06/21   Milford, Harlene HERO, FNP  torsemide   (DEMADEX ) 20 MG tablet Take 3 tablets (60 mg total) by mouth daily. 04/02/23   Hayes Beckey CROME, NP  vitamin B-12 (CYANOCOBALAMIN ) 500 MCG tablet Take 500 mcg by mouth daily.    [provider]    ___________________________________________________________________________________________________ Physical Exam:    03/29/2024    8:54 PM 03/29/2024    8:15 PM 03/29/2024    7:25 PM  Vitals with BMI  Systolic 93 93 87  Diastolic 54 58 61  Pulse 63 92 54     1. General:  in No  Acute distress   Chronically ill  -appearing 2. Psychological: Alert and   Oriented 3. Head/ENT:    Dry Mucous Membranes                          Head Non traumatic, neck supple                          Normal *** Poor Dentition 4. SKIN: normal *** decreased Skin turgor,  Skin clean Dry and intact no rash    5. Heart: Regular rate and rhythm no*** Murmur,  no Rub or gallop 6. Lungs: ***Clear to auscultation bilaterally, no wheezes or crackles   7. Abdomen: Soft, ***non-tender, Non distended *** obese ***bowel sounds present 8. Lower extremities: no clubbing, cyanosis, no ***edema 9. Neurologically Grossly intact, moving all 4 extremities equally *** strength 5 out of 5 in all 4 extremities cranial nerves II through XII intact 10. MSK: Normal range of motion    Chart has been reviewed  ______________________________________________________________________________________________  Assessment/Plan 73 y.o. female with medical history significant of breast cancer, asthma, HLD, HTN, A.fib,   Admitted for   Sepsis,      Present on Admission:  Dyslipidemia  Breast cancer of lower-inner quadrant of right female breast (HCC)  SIRS (systemic inflammatory response syndrome) (HCC)  Class 3 obesity (HCC)  Chronic diastolic heart failure (HCC)   Dyslipidemia Continue zocor  20 mg po   Breast cancer of lower-inner quadrant of right female breast (HCC) Chronic stable  Class 3 obesity (HCC) Contributing to  comorbidity and complicating medical management  Body mass index is 52.86 kg/m.  Nutritional follow up as an out pt would be recommended   Chronic diastolic heart failure (HCC) Avoid over aggressive fluid overload   Other plan as per orders.  DVT prophylaxis:  SCD *** Lovenox      Code Status:    Code Status: Prior FULL CODE *** DNR/DNI ***comfort care as per patient ***family  I had personally discussed CODE STATUS with patient and family*  ACP *** none has been reviewed ***   Family Communication:   Family not at  Bedside  plan of care was discussed on the phone with *** Son, Daughter, Wife, Husband, Sister, Brother , father, mother  Diet    Disposition Plan:        To home once workup is complete and patient is stable   Following barriers for discharge:                             Chest pain *** Stroke *** Syncope ***work up is complete                            Electrolytes corrected                               Anemia corrected h/H stable                             Pain controlled with PO medications                               Afebrile, white count improving able to transition to PO antibiotics                             Will need to be able to tolerate PO                            Will likely need home health, home O2, set up                           Will need consultants to evaluate patient prior to discharge  Work of breathing improves                        ***Would benefit from PT/OT eval prior to DC  Ordered                   Swallow eval - SLP ordered                   Diabetes care coordinator                   Transition of care consulted                   Nutrition    consulted                  Wound care  consulted                   Palliative care    consulted                   Behavioral health  consulted                    Consults called:    NONE   Admission status:  ED Disposition     ED Disposition  Admit    Condition  --   Comment  Hospital Area: [facility] [[id number]]  Level of Care: Progressive [102]  Admit to Progressive based on following criteria: CARDIOVASCULAR & THORACIC of moderate stability with acute coronary syndrome symptoms/low risk myocardial infarction/hypertensive urgency/arrhythmias/heart failure potentially compromising stability and stable post cardiovascular intervention patients.  Admit to Progressive based on following criteria: MULTISYSTEM THREATS such as stable sepsis, metabolic/electrolyte imbalance with or without encephalopathy that is responding to early treatment.  May place patient in observation at [facility] or [facility] if equivalent level of care is available:: No  Diagnosis: SIRS (systemic inflammatory response syndrome) [facility]) [[id number]]  Admitting Physician: Kashmir Lysaght [[id number]]  Attending Physician: Aldina Porta [[id number]]          Obs     Level of care      progressive    Blease Quiver 03/29/2024, 9:42 PM    [facility]     after 2 AM please page floor coverage   If 7AM-7PM, please contact the day team taking care of the patient using [email or web address]

## 2024-03-29 NOTE — Assessment & Plan Note (Signed)
 Contributing to comorbidity and complicating medical management  Body mass index is 52.86 kg/m.  Nutritional follow up as an out pt would be recommended

## 2024-03-29 NOTE — ED Notes (Signed)
 CCMD called, pt placed on the monitor.

## 2024-03-29 NOTE — ED Notes (Signed)
 PT alert, NAD, calm, interactive, resps e/u. GCS 15. Speaking with family at [facility]. Wrist BP erroneous. BP cuff moved to upper arm.

## 2024-03-29 NOTE — Assessment & Plan Note (Addendum)
 Continue zocor  20 mg po

## 2024-03-30 DIAGNOSIS — I2609 Other pulmonary embolism with acute cor pulmonale: Secondary | ICD-10-CM | POA: Diagnosis present

## 2024-03-30 DIAGNOSIS — Z7901 Long term (current) use of anticoagulants: Secondary | ICD-10-CM | POA: Diagnosis not present

## 2024-03-30 DIAGNOSIS — I482 Chronic atrial fibrillation, unspecified: Secondary | ICD-10-CM | POA: Diagnosis not present

## 2024-03-30 DIAGNOSIS — R6520 Severe sepsis without septic shock: Secondary | ICD-10-CM

## 2024-03-30 DIAGNOSIS — N179 Acute kidney failure, unspecified: Secondary | ICD-10-CM | POA: Diagnosis present

## 2024-03-30 DIAGNOSIS — R21 Rash and other nonspecific skin eruption: Secondary | ICD-10-CM | POA: Insufficient documentation

## 2024-03-30 DIAGNOSIS — E66813 Obesity, class 3: Secondary | ICD-10-CM | POA: Diagnosis present

## 2024-03-30 DIAGNOSIS — Z23 Encounter for immunization: Secondary | ICD-10-CM | POA: Diagnosis present

## 2024-03-30 DIAGNOSIS — H44119 Panuveitis, unspecified eye: Secondary | ICD-10-CM | POA: Diagnosis present

## 2024-03-30 DIAGNOSIS — E872 Acidosis, unspecified: Secondary | ICD-10-CM | POA: Diagnosis present

## 2024-03-30 DIAGNOSIS — A419 Sepsis, unspecified organism: Secondary | ICD-10-CM | POA: Diagnosis present

## 2024-03-30 DIAGNOSIS — Z6841 Body Mass Index (BMI) 40.0 and over, adult: Secondary | ICD-10-CM | POA: Diagnosis not present

## 2024-03-30 DIAGNOSIS — J440 Chronic obstructive pulmonary disease with acute lower respiratory infection: Secondary | ICD-10-CM | POA: Diagnosis present

## 2024-03-30 DIAGNOSIS — N183 Chronic kidney disease, stage 3 unspecified: Secondary | ICD-10-CM

## 2024-03-30 DIAGNOSIS — N1832 Chronic kidney disease, stage 3b: Secondary | ICD-10-CM | POA: Diagnosis present

## 2024-03-30 DIAGNOSIS — D649 Anemia, unspecified: Secondary | ICD-10-CM | POA: Insufficient documentation

## 2024-03-30 DIAGNOSIS — J45909 Unspecified asthma, uncomplicated: Secondary | ICD-10-CM | POA: Diagnosis present

## 2024-03-30 DIAGNOSIS — I130 Hypertensive heart and chronic kidney disease with heart failure and stage 1 through stage 4 chronic kidney disease, or unspecified chronic kidney disease: Secondary | ICD-10-CM | POA: Diagnosis present

## 2024-03-30 DIAGNOSIS — I4819 Other persistent atrial fibrillation: Secondary | ICD-10-CM | POA: Diagnosis present

## 2024-03-30 DIAGNOSIS — R509 Fever, unspecified: Secondary | ICD-10-CM | POA: Diagnosis present

## 2024-03-30 DIAGNOSIS — I272 Pulmonary hypertension, unspecified: Secondary | ICD-10-CM | POA: Diagnosis present

## 2024-03-30 DIAGNOSIS — Z1152 Encounter for screening for COVID-19: Secondary | ICD-10-CM | POA: Diagnosis not present

## 2024-03-30 DIAGNOSIS — I10 Essential (primary) hypertension: Secondary | ICD-10-CM | POA: Diagnosis not present

## 2024-03-30 DIAGNOSIS — I5033 Acute on chronic diastolic (congestive) heart failure: Secondary | ICD-10-CM | POA: Diagnosis present

## 2024-03-30 DIAGNOSIS — D352 Benign neoplasm of pituitary gland: Secondary | ICD-10-CM | POA: Diagnosis present

## 2024-03-30 DIAGNOSIS — I080 Rheumatic disorders of both mitral and aortic valves: Secondary | ICD-10-CM | POA: Diagnosis present

## 2024-03-30 DIAGNOSIS — D631 Anemia in chronic kidney disease: Secondary | ICD-10-CM | POA: Diagnosis present

## 2024-03-30 DIAGNOSIS — J189 Pneumonia, unspecified organism: Secondary | ICD-10-CM | POA: Diagnosis present

## 2024-03-30 DIAGNOSIS — G629 Polyneuropathy, unspecified: Secondary | ICD-10-CM | POA: Diagnosis present

## 2024-03-30 DIAGNOSIS — D84821 Immunodeficiency due to drugs: Secondary | ICD-10-CM | POA: Diagnosis present

## 2024-03-30 DIAGNOSIS — H44111 Panuveitis, right eye: Secondary | ICD-10-CM | POA: Diagnosis present

## 2024-03-30 LAB — COMPREHENSIVE METABOLIC PANEL WITH GFR
ALT: 9 U/L (ref 0–44)
AST: 18 U/L (ref 15–41)
Albumin: 3 g/dL — ABNORMAL LOW (ref 3.5–5.0)
Alkaline Phosphatase: 41 U/L (ref 38–126)
Anion gap: 11 (ref 5–15)
BUN: 20 mg/dL (ref 8–23)
CO2: 21 mmol/L — ABNORMAL LOW (ref 22–32)
Calcium: 7.9 mg/dL — ABNORMAL LOW (ref 8.9–10.3)
Chloride: 106 mmol/L (ref 98–111)
Creatinine, Ser: 1.36 mg/dL — ABNORMAL HIGH (ref 0.44–1.00)
GFR, Estimated: 41 mL/min — ABNORMAL LOW (ref >60)
Glucose, Bld: 108 mg/dL — ABNORMAL HIGH (ref 70–99)
Potassium: 4.7 mmol/L (ref 3.5–5.1)
Sodium: 138 mmol/L (ref 135–145)
Total Bilirubin: 2 mg/dL — ABNORMAL HIGH (ref 0.0–1.2)
Total Protein: 5.4 g/dL — ABNORMAL LOW (ref 6.5–8.1)

## 2024-03-30 LAB — CBC
HCT: 33.8 % — ABNORMAL LOW (ref 36.0–46.0)
Hemoglobin: 10.4 g/dL — ABNORMAL LOW (ref 12.0–15.0)
MCH: 30.1 pg (ref 26.0–34.0)
MCHC: 30.8 g/dL (ref 30.0–36.0)
MCV: 98 fL (ref 80.0–100.0)
Platelets: 178 K/uL (ref 150–400)
RBC: 3.45 MIL/uL — ABNORMAL LOW (ref 3.87–5.11)
RDW: 15.1 % (ref 11.5–15.5)
WBC: 4.8 K/uL (ref 4.0–10.5)
nRBC: 0 % (ref 0.0–0.2)

## 2024-03-30 LAB — PHOSPHORUS
Phosphorus: 3.2 mg/dL (ref 2.5–4.6)
Phosphorus: 3.3 mg/dL (ref 2.5–4.6)

## 2024-03-30 LAB — T4, FREE: Free T4: 1.01 ng/dL (ref 0.61–1.12)

## 2024-03-30 LAB — PROCALCITONIN: Procalcitonin: 0.31 ng/mL

## 2024-03-30 LAB — MAGNESIUM
Magnesium: 2 mg/dL (ref 1.7–2.4)
Magnesium: 2 mg/dL (ref 1.7–2.4)

## 2024-03-30 LAB — TSH: TSH: 2.466 u[IU]/mL (ref 0.350–4.500)

## 2024-03-30 LAB — CK: Total CK: 61 U/L (ref 38–234)

## 2024-03-30 LAB — BRAIN NATRIURETIC PEPTIDE: B Natriuretic Peptide: 377.4 pg/mL — ABNORMAL HIGH (ref 0.0–100.0)

## 2024-03-30 MED ORDER — METOPROLOL SUCCINATE ER 50 MG PO TB24
75.0000 mg | ORAL_TABLET | Freq: Two times a day (BID) | ORAL | Status: DC
  Administered 2024-03-30 – 2024-03-31 (×4): 75 mg via ORAL
  Filled 2024-03-30 (×4): qty 1

## 2024-03-30 MED ORDER — ACETAZOLAMIDE 250 MG PO TABS
500.0000 mg | ORAL_TABLET | Freq: Two times a day (BID) | ORAL | Status: DC
  Administered 2024-03-30 (×2): 500 mg via ORAL
  Filled 2024-03-30 (×3): qty 2

## 2024-03-30 MED ORDER — SODIUM CHLORIDE 0.9 % IV SOLN
2.0000 g | INTRAVENOUS | Status: AC
  Administered 2024-03-30 – 2024-04-03 (×5): 2 g via INTRAVENOUS
  Filled 2024-03-30 (×5): qty 20

## 2024-03-30 MED ORDER — HYDROCORTISONE 0.5 % EX CREA
TOPICAL_CREAM | Freq: Two times a day (BID) | CUTANEOUS | Status: DC
  Administered 2024-03-30 – 2024-04-07 (×4): 1 via TOPICAL
  Filled 2024-03-30 (×3): qty 28.35

## 2024-03-30 MED ORDER — EMPAGLIFLOZIN 10 MG PO TABS
10.0000 mg | ORAL_TABLET | Freq: Every day | ORAL | Status: DC
  Administered 2024-03-30: 10 mg via ORAL
  Filled 2024-03-30: qty 1

## 2024-03-30 MED ORDER — SIMVASTATIN 20 MG PO TABS
20.0000 mg | ORAL_TABLET | Freq: Every day | ORAL | Status: DC
  Administered 2024-03-30 – 2024-04-06 (×8): 20 mg via ORAL
  Filled 2024-03-30 (×8): qty 1

## 2024-03-30 MED ORDER — TORSEMIDE 20 MG PO TABS: 60.0000 mg | ORAL_TABLET | Freq: Every day | ORAL | Status: DC

## 2024-03-30 MED ORDER — INFLUENZA VAC SPLIT HIGH-DOSE 0.5 ML IM SUSY
0.5000 mL | PREFILLED_SYRINGE | INTRAMUSCULAR | Status: AC
Start: 2024-03-31 — End: 2024-04-02
  Administered 2024-04-02: 0.5 mL via INTRAMUSCULAR
  Filled 2024-03-30: qty 0.5

## 2024-03-30 MED ORDER — ORAL CARE MOUTH RINSE: 15.0000 mL | OROMUCOSAL | Status: DC | PRN

## 2024-03-30 MED ORDER — SODIUM CHLORIDE 0.9 % IV SOLN
500.0000 mg | INTRAVENOUS | Status: DC
  Administered 2024-03-30 – 2024-03-31 (×2): 500 mg via INTRAVENOUS
  Filled 2024-03-30 (×3): qty 5

## 2024-03-30 MED ORDER — SPIRONOLACTONE 25 MG PO TABS: 25.0000 mg | ORAL_TABLET | Freq: Every day | ORAL | Status: DC

## 2024-03-30 MED ORDER — TORSEMIDE 20 MG PO TABS
20.0000 mg | ORAL_TABLET | Freq: Every day | ORAL | Status: DC
  Administered 2024-03-30: 20 mg via ORAL
  Filled 2024-03-30: qty 1

## 2024-03-30 NOTE — Progress Notes (Signed)
 Nurse requested Mobility Specialist to perform oxygen saturation test with pt which includes removing pt from oxygen both at rest and while ambulating.  Below are the results from that testing.     Patient Saturations on Room Air at Rest = spO2 97%  Patient Saturations on Room Air while Ambulating = sp02 93% .    At end of testing pt left in room on 0  Liters of oxygen.  Reported results to nurse.   Lillyn Wieczorek Mobility Specialist Please contact via Special Educational Needs Teacher or  Rehab office at [phone]

## 2024-03-30 NOTE — Assessment & Plan Note (Addendum)
 Continue Xarelto  20 mg daily Hold Toprol  given hypotension

## 2024-03-30 NOTE — Hospital Course (Addendum)
 Mrs. Bayless was admitted to the hospital with the working diagnosis of heart failure exacerbation in the setting of pneumonia.   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  She called EMS, her vital sings were stable and she was transported to the ED.  In the emergency room her temp was 98.1, blood pressure 87/61, HR 54, RR 22 and 02 saturation 100%  Dry mucous membranes, heart with S1 and S2 present, irregularly irregular, abdomen soft and not distended, and trace lower extremity edema.   Na 141, K 4.6 Cl 103 bicarbonate 26 glucose 100 bun 23 cr 1.48  Wbc 5,8 hgb 11.9 plt 188   Chest radiograph with hypoinflation, right rotation, positive cardiomegaly with bilateral hilar vascular congestion, interstitial infiltrate at the right mid lung.   EKG 85 bpm, normal axis, normal intervals, qtc 446, atrial fibrillation with no significant ST segment or T wave changes.   Patient was placed on IV antibiotic therapy  11/07 noted worsening edema and increased dyspnea, placed on IV furosemide  for diuresis.

## 2024-03-30 NOTE — Assessment & Plan Note (Signed)
 Order CPAP.

## 2024-03-30 NOTE — Assessment & Plan Note (Addendum)
 Complicated with severe sepsis, present on admission.  Sepsis has resolved.  Patient has completed antibiotic therapy

## 2024-03-30 NOTE — Assessment & Plan Note (Addendum)
 Echocardiogram with preserved LV systolic function with EF 70 to 75%, RV systolic function preserved, RVSP 31.5 mmHg, LA with severe dilatation, moderate mitral valve regurgitation, moderate aortic regurgitation   Pulmonary hypertension Acute on chronic cor pulmonale.   Patient was placed on IV furosemide  for diuresis, negative fluid balance was achieved, -10,095 ml, with significant improvement in her symptoms.  Patient lost about 14 Kg during this hospitalization.   Plan to continue metoprolol , spironolactone  and SGLT 2 inh  Continue torsemide  at home.  No entresto due to angioedema with ace inh.  Follow up with Cardiology as outpatient.

## 2024-03-30 NOTE — Assessment & Plan Note (Signed)
 Restart home medications

## 2024-03-30 NOTE — Assessment & Plan Note (Addendum)
 Continue rate control with metoprolol  and anticoagulation with rivaroxaban .

## 2024-03-30 NOTE — Progress Notes (Signed)
 Mobility Specialist Progress Note:   03/30/24 1459  Mobility  Activity Ambulated with assistance  Level of Assistance Contact guard assist, steadying assist  Assistive Device Front wheel walker  Distance Ambulated (ft) 150 ft  Activity Response Tolerated well  Mobility Referral Yes  Mobility visit 1 Mobility  Mobility Specialist Start Time (ACUTE ONLY) 1459  Mobility Specialist Stop Time (ACUTE ONLY) 1512  Mobility Specialist Time Calculation (min) (ACUTE ONLY) 13 min   Pt received in bed, RN requested ambulatory O2 test, see note. Audible SOB, SpO2 WFL. Max HR 191 bpm. RN notified. Returned pt to room, requested to use BR. Void successful. Returned to bed, all needs met.    Leeanna Slaby Mobility Specialist Please contact via Special Educational Needs Teacher or  Rehab office at [phone]

## 2024-03-30 NOTE — Assessment & Plan Note (Signed)
 Hold off on Zocor  for tonight

## 2024-03-30 NOTE — Assessment & Plan Note (Deleted)
 There was some concern in the ER that she may have had an anaphylactic reaction.  To me, she reports developing a fine pink, papular rash after being in a chlorine pool recently for PT.  No urticaria. Nothing that sounds like an acute anaphylactic reaction - Stop steroids - PRN hydrocortisone cream

## 2024-03-30 NOTE — Assessment & Plan Note (Addendum)
 Continue with  cabergoline 

## 2024-03-30 NOTE — Assessment & Plan Note (Deleted)
-   Hold Imuran while treating sepsis

## 2024-03-30 NOTE — Assessment & Plan Note (Addendum)
 AKI, hypokalemia   At the time of discharge her serum cr is 1,62 with K at 3,7 and serum bicarbonate at 29  Na 139  Continue diuresis at home with spironolactone , SLGT 2 inh and loop diuretic Follow up renal function and electrolytes as outpatient.

## 2024-03-30 NOTE — Progress Notes (Signed)
 Mobility Specialist Progress Note:    03/30/24 0936  Mobility  Activity Ambulated with assistance  Level of Assistance Contact guard assist, steadying assist  Assistive Device Front wheel walker  Distance Ambulated (ft) 15 ft  Activity Response Tolerated well  Mobility Referral Yes  Mobility visit 1 Mobility  Mobility Specialist Start Time (ACUTE ONLY) [id number]  Mobility Specialist Stop Time (ACUTE ONLY) 0943  Mobility Specialist Time Calculation (min) (ACUTE ONLY) 7 min   Pt received in bed, requesting to use bathroom. Required CGA and RW to ambulate. Max HR 191 bpm. RN notified.   Jadae Steinke Mobility Specialist Please contact via Special Educational Needs Teacher or  Rehab office at [phone]

## 2024-03-30 NOTE — Care Management Obs Status (Signed)
 MEDICARE OBSERVATION STATUS NOTIFICATION   Patient Details  Name: [name] MRN: [id number] Date of Birth: 03/29/1951   Medicare Observation Status Notification Given:  Yes    Vonzell Arrie Sharps 03/30/2024, 10:58 AM

## 2024-03-30 NOTE — Assessment & Plan Note (Signed)
 Unclear etiology viral exanthem versus allergic reaction No new medications started Started on steroids and Pepcid and Benadryl  given anaphylactoid like reaction with hypotension and patient endorses some wheezing

## 2024-03-30 NOTE — Assessment & Plan Note (Addendum)
 Calculated BMI is 46.8

## 2024-03-30 NOTE — Progress Notes (Signed)
   03/30/24 0038  BiPAP/CPAP/SIPAP  $ Non-Invasive Home Ventilator  Initial  $ Face Mask Medium Yes  BiPAP/CPAP/SIPAP Pt Type Adult  BiPAP/CPAP/SIPAP Resmed  Mask Type Full face mask  Dentures removed? Not applicable  Mask Size Medium  Set Rate 0 breaths/min  Respiratory Rate 23 breaths/min  EPAP 6 cmH2O  PEEP 6 cmH20  FiO2 (%) 21 %  Flow Rate 0 lpm  Minute Ventilation 7.9  Leak 0  Tidal Volume (Vt) 354  Patient Home Machine No  Patient Home Mask No  Patient Home Tubing No  Auto Titrate No  Device Plugged into RED Power Outlet Yes

## 2024-03-30 NOTE — Assessment & Plan Note (Deleted)
 Hemoglobin stable relative to baseline, no clinical bleeding - Trend hemoglobin

## 2024-03-30 NOTE — Assessment & Plan Note (Signed)
 Allow permissive hypertension

## 2024-03-30 NOTE — Assessment & Plan Note (Addendum)
Continue with simvastatin 

## 2024-03-30 NOTE — Assessment & Plan Note (Addendum)
 Follow up as outpatient It has been in remission

## 2024-03-30 NOTE — Assessment & Plan Note (Addendum)
 Continue blood pressure monitoring.

## 2024-03-30 NOTE — Assessment & Plan Note (Signed)
 Patient did not meet sepsis criteria at this time No elevated white blood cell count no tachycardia or fever Patient does have evidence of hypotension and elevated lactic acid which has responded to IV fluids and steroids/epinephrine  IM Given the patient did endorse chills antibiotics were initially started Blood cultures ordered For now we will continue until blood cultures resulted MRSA negative Order respiratory panel COVID-negative

## 2024-03-30 NOTE — Assessment & Plan Note (Signed)
 Continue CPAP.

## 2024-03-30 NOTE — Progress Notes (Signed)
  Progress Note   Patient: [name] FMW:[id number] DOB: 07-23-1950 DOA: 03/29/2024     0 DOS: the patient was seen and examined on 03/30/2024 at 849      Brief hospital course: 73 y.o. 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  In the ER, appeared to have sepsis syndrome, source suspected to be pneumonia although somewhat unclear. Started on broad spectrum antibiotics.  Due to diffuse itching present PTA, was given epinephrine , Solu-Medrol.     Assessment and Plan: * Severe sepsis (HCC) Presented with chills, tachycardia, tachypnea, lactate 3.1, Tbili 2, and infiltrates on CXR. - Continue Rocephin and azithromycin  Chronic atrial fibrillation (HCC) Rate slightly elevated - Continue metoprolol , Xarelto   Essential hypertension Blood pressure elevated - Resume Diamox, metoprolol , Jardiance , torsemide  - Hold spironolactone   Obstructive sleep apnea - Continue CPAP  Pituitary macroadenoma (HCC) - Continue cabergoline   Breast cancer of lower-inner quadrant of right female breast (HCC)    Class 3 obesity (HCC) BMI 53, complicates care  Normocytic anemia Hemoglobin stable relative to baseline, no clinical bleeding - Trend hemoglobin  CKD (chronic kidney disease), stage IIIb (HCC) Creatinine improved to 1.3 with fluids  Panuveitis - Hold Imuran while treating sepsis  Rash There was some concern in the ER that she may have had an anaphylactic reaction.  To me, she reports developing a fine pink, papular rash after being in a chlorine pool recently for PT.  No urticaria. Nothing that sounds like an acute anaphylactic reaction - Stop steroids - PRN hydrocortisone cream  Chronic diastolic heart failure (HCC) Pulmonary hypertension Got 4 L of fluids in the ER.  Presently does not appear in active  CHF -Resume home Diamox, Jardiance , metoprolol , torsemide   Mixed hyperlipidemia - Continue simvastatin           Subjective: Patient is feeling somewhat better, she is still short of breath and coughing.  No fever or confusion.     Physical Exam: BP (!) 146/84 (BP Location: Right Leg)   Pulse 66   Temp 98.4 F (36.9 C) (Oral)   Resp 19   Ht 5' 2 (1.575 m)   Wt 131.8 kg   SpO2 100%   BMI 53.13 kg/m   Adult female, sitting on the edge of the bed, appears weak and tired Heart rate irregularly irregular, no murmurs, JVP not visible, mild edema in the lower extremities bilaterally Respiratory rate seems increased, she has rales bilaterally Abdomen soft, no tenderness palpation Attention normal, affect normal, judgment and insight appear normal    Data Reviewed: Basic metabolic panel shows creatinine down to 1.36 Magnesium  normal CBC shows no leukocytosis, hemoglobin slightly down to 10 Chest x-ray reviewed, shows bilateral infiltrates Abdomen and pelvis reviewed, shows no evidence of gallbladder disease, intra-abdominal abscess, perforation     Family Communication:     Disposition: Status is: Inpatient         Author: Lonni SHAUNNA Dalton, MD 03/30/2024 4:23 PM  For on call review [email or web address].

## 2024-03-31 ENCOUNTER — Inpatient Hospital Stay (HOSPITAL_COMMUNITY)

## 2024-03-31 DIAGNOSIS — A419 Sepsis, unspecified organism: Secondary | ICD-10-CM | POA: Diagnosis not present

## 2024-03-31 DIAGNOSIS — R6520 Severe sepsis without septic shock: Secondary | ICD-10-CM | POA: Diagnosis not present

## 2024-03-31 LAB — CBC
HCT: 31 % — ABNORMAL LOW (ref 36.0–46.0)
Hemoglobin: 9.5 g/dL — ABNORMAL LOW (ref 12.0–15.0)
MCH: 29.8 pg (ref 26.0–34.0)
MCHC: 30.6 g/dL (ref 30.0–36.0)
MCV: 97.2 fL (ref 80.0–100.0)
Platelets: 174 K/uL (ref 150–400)
RBC: 3.19 MIL/uL — ABNORMAL LOW (ref 3.87–5.11)
RDW: 15.6 % — ABNORMAL HIGH (ref 11.5–15.5)
WBC: 7.4 K/uL (ref 4.0–10.5)
nRBC: 0 % (ref 0.0–0.2)

## 2024-03-31 LAB — BASIC METABOLIC PANEL WITH GFR
Anion gap: 11 (ref 5–15)
BUN: 27 mg/dL — ABNORMAL HIGH (ref 8–23)
CO2: 22 mmol/L (ref 22–32)
Calcium: 8.2 mg/dL — ABNORMAL LOW (ref 8.9–10.3)
Chloride: 106 mmol/L (ref 98–111)
Creatinine, Ser: 1.82 mg/dL — ABNORMAL HIGH (ref 0.44–1.00)
GFR, Estimated: 29 mL/min — ABNORMAL LOW (ref >60)
Glucose, Bld: 136 mg/dL — ABNORMAL HIGH (ref 70–99)
Potassium: 4.4 mmol/L (ref 3.5–5.1)
Sodium: 139 mmol/L (ref 135–145)

## 2024-03-31 LAB — T3: T3, Total: 84 ng/dL (ref 71–180)

## 2024-03-31 NOTE — Plan of Care (Signed)
  Problem: Respiratory: Goal: Ability to maintain adequate ventilation will improve Outcome: Progressing   Problem: Education: Goal: Knowledge of General Education information will improve Description: Including pain rating scale, medication(s)/side effects and non-pharmacologic comfort measures Outcome: Progressing   Problem: Clinical Measurements: Goal: Respiratory complications will improve Outcome: Progressing   Problem: Pain Managment: Goal: General experience of comfort will improve and/or be controlled Outcome: Progressing

## 2024-03-31 NOTE — Progress Notes (Signed)
 Mobility Specialist Progress Note:   03/31/24 1050  Mobility  Activity Ambulated with assistance  Level of Assistance Minimal assist, patient does 75% or more  Assistive Device Front wheel walker  Distance Ambulated (ft) 150 ft  Activity Response Tolerated well  Mobility Referral Yes  Mobility visit 1 Mobility  Mobility Specialist Start Time (ACUTE ONLY) 1050  Mobility Specialist Stop Time (ACUTE ONLY) 1114  Mobility Specialist Time Calculation (min) (ACUTE ONLY) 24 min   Pt received in bed, agreeable to mobility session. Ambulated in hallway with RW and MinA to stand. Tolerated well, took 3 sitting rest breaks throughout. C/o SOB,  SpO2 WFL throughout. Max HR 105 bpm. Returned pt to room, sitting up in chair. RN notified.   Ryosuke Ericksen Mobility Specialist Please contact via Special Educational Needs Teacher or  Rehab office at [phone]

## 2024-03-31 NOTE — Progress Notes (Signed)
 Progress Note   Patient: [name] FMW:[id number] DOB: [birth year] DOA: 03/29/2024     1 DOS: the patient was seen and examined on 03/31/2024 at 9:40 AM      Brief hospital course: 73 y.o. 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  In the ER, appeared to have sepsis syndrome, source suspected to be pneumonia although somewhat unclear. Started on broad spectrum antibiotics.  Due to diffuse itching present PTA, was given epinephrine , Solu-Medrol.  Subsequently, anaphylaxis doubted.  Today, Cr up and SOB increased.     Assessment and Plan: * Severe sepsis (HCC) Presented with chills, tachycardia, tachypnea, lactate 3.1, hypotension requiring 4L IV fluids, Tbili 2, and infiltrates on CXR.  Symptomatically suspect this was pneumonia.  CT and CXR somewhat discordant.  Clnically improving quickly - Continue Rocephin and azithromycin day 3 of 5 - IS and flutter    AKI on CKD (chronic kidney disease), stage IIIb (HCC) Baseline Cr 1.3-1.4.  Today up to 1.8.  Ambiguous if this is congestive due to excess fluid resuscitation in the ER, or ischemic from  - Hold diuretics today - If Cr steady tomorrow, would resume home spironolactone , Diamox and Jardiance  and start IV Lasix  to diurese   - Hold torsemide , diamox, spironolactone , Jardiance  today   Chronic diastolic CHF  Pulmonary hypertension See above.  Got a lot of fluids in ER.  Today feeling SOB with exertion, orthopnea, a little swollen.   -Resume home Diamox, Jardiance , metoprolol , torsemide  - * Note from dispense record, she takes only Jardiance , none of the other diuretics   Chronic atrial fibrillation (HCC) Rate controlled - Continue metoprolol , Xarelto    Essential hypertension BP 140s - Continue metoprolol  - Hold Diamox, metoprolol , Jardiance ,  torsemide , spironolactone  for now    Obstructive sleep apnea - Continue CPAP   Pituitary macroadenoma (HCC) - Continue cabergoline   Breast cancer of lower-inner quadrant of right female breast (HCC)    Class 3 obesity (HCC) BMI 53, complicates care   Normocytic anemia Hemoglobin trending down slightly, no clinical bleeding - Trend hemoglobin   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  To me, she reports developing a fine pink, papular rash after being in a chlorine pool recently for PT.  No urticaria. Nothing that sounds like an acute anaphylactic reaction - Stop steroids - PRN hydrocortisone cream   Mixed hyperlipidemia - Continue simvastatin           Subjective: Patient is feeling more short of breath today, somewhat short of breath with lying flat.  No fever, no confusion, no respiratory distress.  Feeling okay at rest, also feels somewhat swollen.     Physical Exam: BP (!) 146/67   Pulse 76   Temp 97.8 F (36.6 C) (Axillary)   Resp 20   Ht 5' 2 (1.575 m)   Wt 131.8 kg   SpO2 100%   BMI 53.13 kg/m   Adult female, sitting on edge of bed, interactive and appropriate RRR, heart sounds distant but no murmurs, possibly some pitting edema although habitus limits fluid estimation on exam Respiratory rate normal, effort normal, lung sounds diminished but no rales or wheezes appreciated Attention normal, affect appropriate, judgment and insight appear normal    Data Reviewed:  Basic metabolic panel shows creatinine up to 1.8, BUN up to 27 CBC shows hemoglobin down to 9.5, no leukocytosis    Family Communication: None present     Disposition: Status is: Inpatient 73 y.o. F with pHTN, dCHF presented with chills, CXR and procal suggested pneumonia sepsis.  Hypotensive in the ER and got large fluid resuscitation, suspect may need diuresis in the coming  days.          Author: Lonni SHAUNNA Dalton, MD 03/31/2024 3:04 PM  For on call review [email or web address].

## 2024-03-31 NOTE — Progress Notes (Signed)
 The patient  has had 2.04 pause and another time 2. 41. She's asymptomatic.  Notified Dr. Lawence and he discontinued her Metoprolol . Will continue to monitor.

## 2024-03-31 NOTE — Progress Notes (Signed)
   03/31/24 1047  TOC Brief Assessment  Insurance and Status Reviewed  Patient has primary care physician Yes  Home environment has been reviewed lives with family  Prior level of function: independent  Prior/Current Home Services No current home services  Social Drivers of Health Review SDOH reviewed no interventions necessary  Readmission risk has been reviewed Yes  Transition of care needs no transition of care needs at this time

## 2024-04-01 DIAGNOSIS — A419 Sepsis, unspecified organism: Secondary | ICD-10-CM | POA: Diagnosis not present

## 2024-04-01 DIAGNOSIS — R6520 Severe sepsis without septic shock: Secondary | ICD-10-CM | POA: Diagnosis not present

## 2024-04-01 LAB — BASIC METABOLIC PANEL WITH GFR
Anion gap: 11 (ref 5–15)
BUN: 31 mg/dL — ABNORMAL HIGH (ref 8–23)
CO2: 22 mmol/L (ref 22–32)
Calcium: 8.3 mg/dL — ABNORMAL LOW (ref 8.9–10.3)
Chloride: 107 mmol/L (ref 98–111)
Creatinine, Ser: 1.62 mg/dL — ABNORMAL HIGH (ref 0.44–1.00)
GFR, Estimated: 33 mL/min — ABNORMAL LOW (ref >60)
Glucose, Bld: 95 mg/dL (ref 70–99)
Potassium: 4.1 mmol/L (ref 3.5–5.1)
Sodium: 140 mmol/L (ref 135–145)

## 2024-04-01 LAB — CBC
HCT: 30.3 % — ABNORMAL LOW (ref 36.0–46.0)
Hemoglobin: 9.3 g/dL — ABNORMAL LOW (ref 12.0–15.0)
MCH: 29.7 pg (ref 26.0–34.0)
MCHC: 30.7 g/dL (ref 30.0–36.0)
MCV: 96.8 fL (ref 80.0–100.0)
Platelets: 178 K/uL (ref 150–400)
RBC: 3.13 MIL/uL — ABNORMAL LOW (ref 3.87–5.11)
RDW: 15.7 % — ABNORMAL HIGH (ref 11.5–15.5)
WBC: 6.1 K/uL (ref 4.0–10.5)
nRBC: 0 % (ref 0.0–0.2)

## 2024-04-01 MED ORDER — FUROSEMIDE 10 MG/ML IJ SOLN
80.0000 mg | Freq: Two times a day (BID) | INTRAMUSCULAR | Status: DC
  Administered 2024-04-01 – 2024-04-06 (×11): 80 mg via INTRAVENOUS
  Filled 2024-04-01 (×11): qty 8

## 2024-04-01 MED ORDER — AZITHROMYCIN 500 MG PO TABS
500.0000 mg | ORAL_TABLET | Freq: Every day | ORAL | Status: AC
  Administered 2024-04-01 – 2024-04-03 (×3): 500 mg via ORAL
  Filled 2024-04-01 (×3): qty 1

## 2024-04-01 NOTE — Progress Notes (Signed)
 Mobility Specialist Progress Note:    04/01/24 1149  Mobility  Activity Ambulated with assistance  Level of Assistance Moderate assist, patient does 50-74%  Assistive Device Front wheel walker  Distance Ambulated (ft) 150 ft  Activity Response Tolerated well  Mobility Referral Yes  Mobility visit 1 Mobility  Mobility Specialist Start Time (ACUTE ONLY) 1149  Mobility Specialist Stop Time (ACUTE ONLY) 1200  Mobility Specialist Time Calculation (min) (ACUTE ONLY) 11 min   Pt received in chair, agreeable to mobility session. Required 4 seated rest breaks via chair follow. ModA to stand from chair d/t fatigue. Max HR 115 bpm. C/o SOB, SpO2 WFL. Returned pt to room, sitting up in chair with all needs met.   Chandrea Zellman Mobility Specialist Please contact via Special Educational Needs Teacher or  Rehab office at [phone]

## 2024-04-01 NOTE — Progress Notes (Signed)
PHARMACIST - PHYSICIAN COMMUNICATION  CONCERNING: Antibiotic IV to Oral Route Change Policy  RECOMMENDATION: This patient is receiving Azithromycin by the intravenous route.  Based on criteria approved by the Pharmacy and Therapeutics Committee, the antibiotic(s) is/are being converted to the equivalent oral dose form(s).   DESCRIPTION: These criteria include:  Patient being treated for a respiratory tract infection, urinary tract infection, cellulitis or clostridium difficile associated diarrhea if on metronidazole  The patient is not neutropenic and does not exhibit a GI malabsorption state  The patient is eating (either orally or via tube) and/or has been taking other orally administered medications for a least 24 hours  The patient is improving clinically and has a Tmax < 100.5  If you have questions about this conversion, please contact the Pharmacy Department  []   [phone] )  [facility] []   [phone] )  [facility] [x]   [phone] )  [facility] []   [phone] )  [facility] []   [phone] )  [place], PharmD Clinical Pharmacist **Pharmacist phone directory can now be found on [email or web address] ([email or web address]).  Listed under [place].

## 2024-04-01 NOTE — Progress Notes (Signed)
 Progress Note   Patient: [name] FMW:[id number] DOB: [birth year] DOA: 03/29/2024     2 DOS: the patient was seen and examined on 04/01/2024 at 9:40 AM      Brief hospital course: Patient is a 73 year old female, morbidly obese, with past medical history significant for asthma, breast cancer, hypertension, hyperlipidemia, lymphedema, OSA on CPAP, pituitary macroadenoma, persistent atrial fibrillation on Xarelto  and chronic kidney disease stage IIIb.  Patient presented to the hospital with chills, cough, worsening edema and shortness of breath.  Patient was admitted with concerns for SIRS/severe sepsis, acute kidney injury on chronic kidney disease, likely diastolic CHF with exacerbation.  BNP done on 03/30/2024 was 377.  04/01/2024: Patient seen.  Patient continues to report worsening edema and shortness of breath.  There is associated wheezing (query from upper airways).  Patient was on diuretics (torsemide  60 mg p.o. once daily) prior to admission.  Patient has not been on diuretics.  Start patient on IV Lasix .  Sepsis physiology seems to have resolved significantly.  Patient is on IV Rocephin 2 g every 24 hours and azithromycin 500 mg p.o. daily.    BMP done today revealed sodium of 140, potassium of 4.1, chloride 107, CO2 of 22, BUN of 31, serum creatinine of 1.61 blood sugar of 95.  CBC revealed WBC of 6.1, hemoglobin of 9.3, hematocrit of 30.3 and platelet count of 178.  Chest x-ray done yesterday, 03/31/2024 revealed cardiomegaly and central vascular prominence.   Assessment and Plan: * Severe sepsis (HCC) As per prior documentation: Presented with chills, tachycardia, tachypnea, lactate 3.1, hypotension requiring 4L IV fluids, Tbili 2, and infiltrates on CXR.  Symptomatically suspect this was pneumonia.  CT and CXR somewhat discordant.  Clnically improving quickly - Continue Rocephin and azithromycin day 3 of 5 - IS and flutter 04/01/2024: Sepsis physiology has resolved.  Complete  course of antibiotics.  AKI on CKD (chronic kidney disease), stage IIIb (HCC) Baseline Cr 1.3-1.46 AKI is likely multifactorial. Patient has been exposed to contrast dye.   Serum creatinine of 1.62 today.   - Continue to monitor serum creatinine and electrolytes.   - Avoid nephrotoxins.    Acute on chronic diastolic CHF  Pulmonary hypertension -Start IV Lasix . - Monitor electrolytes and symptoms  Chronic atrial fibrillation (HCC) -Continue to monitor clinically. - Rate controlled  Essential hypertension -Continue to optimize.  Obstructive sleep apnea - Continue CPAP   Pituitary macroadenoma (HCC) - Continue cabergoline   Breast cancer of lower-inner quadrant of right female breast (HCC)    Class 3 obesity (HCC) BMI 53 Diet and exercise PCP to consider the semaglutide or Mounjaro i.e. if not contraindicated  Normocytic anemia Hemoglobin is stable. 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 Patient is volume depeted NECK: Difficult to assess Lungs: Wheezing, ?From upper airways Neuro - Non focal.  Patient moves extremities Extremities. 2+ to 3 bilateral leg edema Abdomen: Soft and non tender    Data Reviewed: Basic metabolic panel shows BUN of 31 and serum creatinine  of 1.62  CBC shows hemoglobin down to 9.3    Family Communication:     Disposition: Status is: Inpatient     Author: Leatrice LILLETTE Chapel, MD 04/01/2024 4:38 PM  For on call review [email or web address].

## 2024-04-01 NOTE — Plan of Care (Signed)
  Problem: Education: Goal: Knowledge of General Education information will improve Description: Including pain rating scale, medication(s)/side effects and non-pharmacologic comfort measures Outcome: Progressing   Problem: Activity: Goal: Risk for activity intolerance will decrease Outcome: Progressing   Problem: Pain Managment: Goal: General experience of comfort will improve and/or be controlled Outcome: Progressing   Problem: Clinical Measurements: Goal: Cardiovascular complication will be avoided Outcome: Not Progressing

## 2024-04-02 DIAGNOSIS — R6520 Severe sepsis without septic shock: Secondary | ICD-10-CM | POA: Diagnosis not present

## 2024-04-02 DIAGNOSIS — A419 Sepsis, unspecified organism: Secondary | ICD-10-CM | POA: Diagnosis not present

## 2024-04-02 LAB — BASIC METABOLIC PANEL WITH GFR
Anion gap: 14 (ref 5–15)
BUN: 31 mg/dL — ABNORMAL HIGH (ref 8–23)
CO2: 23 mmol/L (ref 22–32)
Calcium: 8.5 mg/dL — ABNORMAL LOW (ref 8.9–10.3)
Chloride: 105 mmol/L (ref 98–111)
Creatinine, Ser: 1.69 mg/dL — ABNORMAL HIGH (ref 0.44–1.00)
GFR, Estimated: 31 mL/min — ABNORMAL LOW (ref >60)
Glucose, Bld: 93 mg/dL (ref 70–99)
Potassium: 3.8 mmol/L (ref 3.5–5.1)
Sodium: 142 mmol/L (ref 135–145)

## 2024-04-02 LAB — RENAL FUNCTION PANEL
Albumin: 3.1 g/dL — ABNORMAL LOW (ref 3.5–5.0)
Anion gap: 10 (ref 5–15)
BUN: 30 mg/dL — ABNORMAL HIGH (ref 8–23)
CO2: 24 mmol/L (ref 22–32)
Calcium: 8.3 mg/dL — ABNORMAL LOW (ref 8.9–10.3)
Chloride: 107 mmol/L (ref 98–111)
Creatinine, Ser: 1.76 mg/dL — ABNORMAL HIGH (ref 0.44–1.00)
GFR, Estimated: 30 mL/min — ABNORMAL LOW (ref >60)
Glucose, Bld: 90 mg/dL (ref 70–99)
Phosphorus: 4.2 mg/dL (ref 2.5–4.6)
Potassium: 3.8 mmol/L (ref 3.5–5.1)
Sodium: 141 mmol/L (ref 135–145)

## 2024-04-02 LAB — CBC
HCT: 31.2 % — ABNORMAL LOW (ref 36.0–46.0)
Hemoglobin: 9.9 g/dL — ABNORMAL LOW (ref 12.0–15.0)
MCH: 30.3 pg (ref 26.0–34.0)
MCHC: 31.7 g/dL (ref 30.0–36.0)
MCV: 95.4 fL (ref 80.0–100.0)
Platelets: 177 K/uL (ref 150–400)
RBC: 3.27 MIL/uL — ABNORMAL LOW (ref 3.87–5.11)
RDW: 15.3 % (ref 11.5–15.5)
WBC: 4.9 K/uL (ref 4.0–10.5)
nRBC: 0 % (ref 0.0–0.2)

## 2024-04-02 LAB — FOLATE: Folate: 10.5 ng/mL (ref >5.9)

## 2024-04-02 LAB — VITAMIN B12: Vitamin B-12: 575 pg/mL (ref 180–914)

## 2024-04-02 NOTE — Progress Notes (Signed)
 Mobility Specialist Progress Note:    04/02/24 0906  Mobility  Activity Ambulated with assistance  Level of Assistance Moderate assist, patient does 50-74%  Assistive Device Front wheel walker  Distance Ambulated (ft) 30 ft  Activity Response Tolerated well  Mobility Referral Yes  Mobility visit 1 Mobility  Mobility Specialist Start Time (ACUTE ONLY) [id number]  Mobility Specialist Stop Time (ACUTE ONLY) [id number]  Mobility Specialist Time Calculation (min) (ACUTE ONLY) 17 min   Pt received in bed, eager to use bathroom. ModA to stand from bed with RW. Tolerated well, max HR 130 bpm. Void successful. Sitting up at sink for bath. Nsg staff aware. All needs met.    Airelle Everding Mobility Specialist Please contact via Special Educational Needs Teacher or  Rehab office at [phone]

## 2024-04-02 NOTE — Plan of Care (Signed)
  Problem: Fluid Volume: Goal: Hemodynamic stability will improve Outcome: Progressing   Problem: Clinical Measurements: Goal: Diagnostic test results will improve Outcome: Progressing Goal: Signs and symptoms of infection will decrease Outcome: Progressing   Problem: Respiratory: Goal: Ability to maintain adequate ventilation will improve Outcome: Progressing   Problem: Education: Goal: Knowledge of General Education information will improve Description: Including pain rating scale, medication(s)/side effects and non-pharmacologic comfort measures Outcome: Progressing   Problem: Health Behavior/Discharge Planning: Goal: Ability to manage health-related needs will improve Outcome: Progressing   Problem: Clinical Measurements: Goal: Ability to maintain clinical measurements within normal limits will improve Outcome: Progressing Goal: Will remain free from infection Outcome: Progressing Goal: Diagnostic test results will improve Outcome: Progressing Goal: Respiratory complications will improve Outcome: Progressing   Problem: Nutrition: Goal: Adequate nutrition will be maintained Outcome: Progressing   Problem: Elimination: Goal: Will not experience complications related to bowel motility Outcome: Progressing Goal: Will not experience complications related to urinary retention Outcome: Progressing   Problem: Pain Managment: Goal: General experience of comfort will improve and/or be controlled Outcome: Progressing   Problem: Safety: Goal: Ability to remain free from injury will improve Outcome: Progressing   Problem: Skin Integrity: Goal: Risk for impaired skin integrity will decrease Outcome: Progressing

## 2024-04-02 NOTE — Progress Notes (Signed)
 Progress Note   Patient: [name] FMW:[id number] DOB: Aug 18, 1950 DOA: 03/29/2024     3 DOS: the patient was seen and examined on 04/02/2024 at 9:40 AM      Brief hospital course: Patient is a 73 year old female, morbidly obese, with past medical history significant for asthma, breast cancer, hypertension, hyperlipidemia, lymphedema, OSA on CPAP, pituitary macroadenoma, persistent atrial fibrillation on Xarelto  and chronic kidney disease stage IIIb.  Patient presented to the hospital with chills, cough, worsening edema and shortness of breath.  Patient was admitted with concerns for SIRS/severe sepsis, acute kidney injury on chronic kidney disease, likely diastolic CHF with exacerbation.  BNP done on 03/30/2024 was 377.  04/01/2024: Patient seen.  Patient continues to report worsening edema and shortness of breath.  There is associated wheezing (query from upper airways).  Patient was on diuretics (torsemide  60 mg p.o. once daily) prior to admission.  Patient has not been on diuretics.  Start patient on IV Lasix .  Sepsis physiology seems to have resolved significantly.  Patient is on IV Rocephin 2 g every 24 hours and azithromycin 500 mg p.o. daily.    BMP done today revealed sodium of 140, potassium of 4.1, chloride 107, CO2 of 22, BUN of 31, serum creatinine of 1.61 blood sugar of 95.  CBC revealed WBC of 6.1, hemoglobin of 9.3, hematocrit of 30.3 and platelet count of 178.  Chest x-ray done yesterday, 03/31/2024 revealed cardiomegaly and central vascular prominence.  04/02/2024: Patient seen.  Shortness of breath and leg edema improving.  Will continue IV Lasix  for now.  Follow renal function and electrolytes.  Patient's daughter updated over the phone.   Assessment and Plan: * Severe sepsis (HCC) As per prior documentation: Presented with chills, tachycardia, tachypnea, lactate 3.1, hypotension requiring 4L IV fluids, Tbili 2, and infiltrates on CXR.  Symptomatically suspect this was  pneumonia.  CT and CXR somewhat discordant.  Clnically improving quickly - Continue Rocephin and azithromycin day 3 of 5 - IS and flutter 04/02/2024: Sepsis physiology has resolved.  Complete course of antibiotics.  AKI on CKD (chronic kidney disease), stage IIIb (HCC) Baseline Cr 1.3-1.46 AKI is likely multifactorial. Patient has been exposed to contrast dye.   Serum creatinine of 1.62 today.   - Continue to monitor serum creatinine and electrolytes.  BMP done today revealed BUN of 30, serum creatinine of 1.76.  Continue to monitor renal function and electrolytes. - Avoid nephrotoxins.    Acute on chronic diastolic CHF  Pulmonary hypertension - Continue IV Lasix . - Monitor electrolytes and symptoms  Chronic atrial fibrillation (HCC) -Continue to monitor clinically. - Rate controlled  Essential hypertension -Continue to optimize.  Obstructive sleep apnea - Continue CPAP   Pituitary macroadenoma (HCC) - Continue cabergoline   Breast cancer of lower-inner quadrant of right female breast (HCC)    Class 3 obesity (HCC) BMI 53 Diet and exercise PCP to consider the semaglutide or Mounjaro i.e. if not contraindicated  Normocytic anemia Hemoglobin is stable. 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 Patient is volume depeted NECK: Difficult to assess Lungs: Wheezing, ?From upper airways Neuro - Non focal.  Patient moves extremities Extremities. 2+ to 3 bilateral leg edema, improving. Abdomen: Soft and non tender    Data Reviewed: Basic metabolic panel shows BUN of 30 and serum  creatinine of 1.76  CBC shows hemoglobin down to 9.3    Family Communication:     Disposition: Status is: Inpatient  Time spent: 35 minutes.  Author: Leatrice LILLETTE Chapel, MD 04/02/2024 12:58 PM  For on call review [email or web address].

## 2024-04-03 DIAGNOSIS — A419 Sepsis, unspecified organism: Secondary | ICD-10-CM | POA: Diagnosis not present

## 2024-04-03 DIAGNOSIS — R6520 Severe sepsis without septic shock: Secondary | ICD-10-CM | POA: Diagnosis not present

## 2024-04-03 LAB — CULTURE, BLOOD (ROUTINE X 2)
Culture: NO GROWTH
Culture: NO GROWTH

## 2024-04-03 LAB — RENAL FUNCTION PANEL
Albumin: 3 g/dL — ABNORMAL LOW (ref 3.5–5.0)
Anion gap: 13 (ref 5–15)
BUN: 30 mg/dL — ABNORMAL HIGH (ref 8–23)
CO2: 27 mmol/L (ref 22–32)
Calcium: 8.5 mg/dL — ABNORMAL LOW (ref 8.9–10.3)
Chloride: 103 mmol/L (ref 98–111)
Creatinine, Ser: 1.6 mg/dL — ABNORMAL HIGH (ref 0.44–1.00)
GFR, Estimated: 33 mL/min — ABNORMAL LOW (ref >60)
Glucose, Bld: 98 mg/dL (ref 70–99)
Phosphorus: 4 mg/dL (ref 2.5–4.6)
Potassium: 3.7 mmol/L (ref 3.5–5.1)
Sodium: 143 mmol/L (ref 135–145)

## 2024-04-03 LAB — CBC
HCT: 31.4 % — ABNORMAL LOW (ref 36.0–46.0)
Hemoglobin: 10.1 g/dL — ABNORMAL LOW (ref 12.0–15.0)
MCH: 29.8 pg (ref 26.0–34.0)
MCHC: 32.2 g/dL (ref 30.0–36.0)
MCV: 92.6 fL (ref 80.0–100.0)
Platelets: 211 K/uL (ref 150–400)
RBC: 3.39 MIL/uL — ABNORMAL LOW (ref 3.87–5.11)
RDW: 15.1 % (ref 11.5–15.5)
WBC: 4.8 K/uL (ref 4.0–10.5)
nRBC: 0 % (ref 0.0–0.2)

## 2024-04-03 LAB — BASIC METABOLIC PANEL WITH GFR
Anion gap: 13 (ref 5–15)
BUN: 31 mg/dL — ABNORMAL HIGH (ref 8–23)
CO2: 26 mmol/L (ref 22–32)
Calcium: 8.3 mg/dL — ABNORMAL LOW (ref 8.9–10.3)
Chloride: 101 mmol/L (ref 98–111)
Creatinine, Ser: 1.59 mg/dL — ABNORMAL HIGH (ref 0.44–1.00)
GFR, Estimated: 34 mL/min — ABNORMAL LOW (ref >60)
Glucose, Bld: 96 mg/dL (ref 70–99)
Potassium: 3.5 mmol/L (ref 3.5–5.1)
Sodium: 140 mmol/L (ref 135–145)

## 2024-04-03 NOTE — Progress Notes (Signed)
 Progress Note   Patient: [name] FMW:[id number] DOB: 01-02-1951 DOA: 03/29/2024     4 DOS: the patient was seen and examined on 04/03/2024 at 9:40 AM      Brief hospital course: Patient is a 73 year old female, morbidly obese, with past medical history significant for asthma, breast cancer, hypertension, hyperlipidemia, lymphedema, OSA on CPAP, pituitary macroadenoma, persistent atrial fibrillation on Xarelto  and chronic kidney disease stage IIIb.  Patient presented to the hospital with chills, cough, worsening edema and shortness of breath.  Patient was admitted with concerns for SIRS/severe sepsis, acute kidney injury on chronic kidney disease, likely diastolic CHF with exacerbation.  BNP done on 03/30/2024 was 377.  04/01/2024: Patient seen.  Patient continues to report worsening edema and shortness of breath.  There is associated wheezing (query from upper airways).  Patient was on diuretics (torsemide  60 mg p.o. once daily) prior to admission.  Patient has not been on diuretics.  Start patient on IV Lasix .  Sepsis physiology seems to have resolved significantly.  Patient is on IV Rocephin 2 g every 24 hours and azithromycin 500 mg p.o. daily.    BMP done today revealed sodium of 140, potassium of 4.1, chloride 107, CO2 of 22, BUN of 31, serum creatinine of 1.61 blood sugar of 95.  CBC revealed WBC of 6.1, hemoglobin of 9.3, hematocrit of 30.3 and platelet count of 178.  Chest x-ray done yesterday, 03/31/2024 revealed cardiomegaly and central vascular prominence.  04/02/2024: Patient seen.  Shortness of breath and leg edema improving.  Will continue IV Lasix  for now.  Follow renal function and electrolytes.  Patient's daughter updated over the phone.  04/03/2024: Patient seen.  Patient continues to improve.  Leg edema is improving.  Shortness of breath is slowly improving.  Patient remains volume overloaded.  Serum creatinine of 1.6 today.  Continue IV Lasix .  Continue to monitor renal  function and electrolytes.   Assessment and Plan: * Severe sepsis (HCC) As per prior documentation: Presented with chills, tachycardia, tachypnea, lactate 3.1, hypotension requiring 4L IV fluids, Tbili 2, and infiltrates on CXR.  Symptomatically suspect this was pneumonia.  CT and CXR somewhat discordant.  Clnically improving quickly - Continue Rocephin and azithromycin day 3 of 5 - IS and flutter 04/02/2024: Sepsis physiology has resolved.  Complete course of antibiotics. 04/03/2024: Patient has completed course of antibiotics.  AKI on CKD (chronic kidney disease), stage IIIb (HCC) Baseline Cr 1.3-1.46 AKI is likely multifactorial. Patient has been exposed to contrast dye.   Serum creatinine of 1.62 today.   - Continue to monitor serum creatinine and electrolytes.  BMP done today revealed BUN of 30, serum creatinine of 1.76.  Continue to monitor renal function and electrolytes. - Avoid nephrotoxins.   04/03/2024: Renal function is slowly improving with diuresis.  Serum creatinine of 1.6 today.  Acute on chronic diastolic CHF  Pulmonary hypertension - Continue IV Lasix  80 mg twice daily. - Continue to monitor electrolytes and symptoms  Chronic atrial fibrillation (HCC) -Continue to monitor clinically. - Rate controlled  Essential hypertension -Continue to optimize.  Obstructive sleep apnea - Continue CPAP   Pituitary macroadenoma (HCC) - Continue cabergoline   Breast cancer of lower-inner quadrant of right female breast (HCC)    Class 3 obesity (HCC) BMI 53 Diet and exercise PCP to consider the semaglutide or Mounjaro i.e. if not contraindicated  Normocytic anemia Hemoglobin is stable. 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     Physical Exam: BP 120/80 (BP Location: Right Leg)   Pulse  74   Temp 97.7 F (36.5 C) (Oral)   Resp 18   Ht 5' 2 (1.575 m)   Wt 129.7 kg   SpO2 98%   BMI 52.30 kg/m    General examination: Patient is morbidly obese. Patient is volume depeted NECK: Difficult to assess Lungs: Wheezing, ?From upper airways Neuro - Non focal.  Patient moves extremities Extremities. 2+ to 3 bilateral leg edema, improving. Abdomen: Soft and non tender    Data Reviewed: Basic metabolic panel shows BUN of 30 and serum creatinine of 1.76  CBC shows hemoglobin down to 9.3    Family Communication:     Disposition: Status is: Inpatient  Time spent: 35 minutes.  Author: Leatrice LILLETTE Chapel, MD 04/03/2024 10:10 AM  For on call review [email or web address].

## 2024-04-03 NOTE — Evaluation (Signed)
 Occupational Therapy Evaluation Patient Details Name: [name] MRN: [id number] DOB: 28-Feb-1951 Today's Date: 04/03/2024   History of Present Illness   Pt is a 73 y.o. female admitted 03/29/24 for concerns of SIRS/severe sepsis, acute kidney injury, and likely diastolic CHF with exacerbation. CXR revealed cardiomegaly and central vascular prominence. PMHx: asthma, breast cancer, HTN, HLD, morbidly obesity, lymphedema, OSA on CPAP, pituitary macroadenoma, persistent atrial fibrillation on Xarelto , and CKD stage IIIb.     Clinical Impressions Pt received in chair, agreeable for OT visit. AOX4. Pleasant & participatory. PTA, pt was living with her spouse and was reportedly indep with ADLs, IADLs, and mobility with rollator. She presents today generally weak and fluid up with +1 edema to BLE observed. Functionally, she required max-total A for LB ADLs and CGA for standing UB ADLs. STS from chair with incr effort/time and ambulated in room via bari rollator with CGA and inc truncal sway. Indicated 6/10 exertion via RPE indicating reduced activity tolerance. HR initially tachy at rest 90-100s, incr into 160s with mobility, no ectopy observed. Asymptomatic; overall tolerated session well.  Pt is currently functioning below baseline and would benefit from ongoing acute OT services to progress towards safe discharge and to facilitate return to prior level of function. Current recommendation is home with home health OT.     If plan is discharge home, recommend the following:   A little help with bathing/dressing/bathroom;Assistance with cooking/housework;Assist for transportation;Help with stairs or ramp for entrance     Functional Status Assessment   Patient has had a recent decline in their functional status and demonstrates the ability to make significant improvements in function in a reasonable and predictable amount of time.     Equipment Recommendations   None recommended by OT      Recommendations for Other Services         Precautions/Restrictions   Precautions Precautions: Fall Recall of Precautions/Restrictions: Intact Precaution/Restrictions Comments: monitor HR Restrictions Weight Bearing Restrictions Per Provider Order: No     Mobility Bed Mobility               General bed mobility comments: not assessed - pt received and left OOB    Transfers Overall transfer level: Needs assistance Equipment used: Rolling walker (2 wheels) Transfers: Sit to/from Stand Sit to Stand: Contact guard assist           General transfer comment: Inc time/effort to stand from chair, utilized B hand rails with heavy reliance. VC for safe hand placement relative to rollator.      Balance Overall balance assessment: Needs assistance Sitting-balance support: No upper extremity supported, Feet supported Sitting balance-Leahy Scale: Good Sitting balance - Comments: seated unsupported in chair   Standing balance support: Bilateral upper extremity supported, During functional activity, Reliant on assistive device for balance Standing balance-Leahy Scale: Poor Standing balance comment: CGA with significant lateral truncal sway during ambulation with bari rollator                           ADL either performed or assessed with clinical judgement   ADL Overall ADL's : Needs assistance/impaired Eating/Feeding: Independent   Grooming: Contact guard assist;Wash/dry hands;Standing           Upper Body Dressing : Set up;Sitting   Lower Body Dressing: Total assistance Lower Body Dressing Details (indicate cue type and reason): adjusting B socks, limited by BLE edema & large body habitus     Toileting- Clothing  Manipulation and Hygiene: Maximal assistance Toileting - Clothing Manipulation Details (indicate cue type and reason): purewick in place, anticipate incr A needed for other components of toileting 2/2 functional deficits              Vision Baseline Vision/History: 1 Wears glasses Ability to See in Adequate Light: 0 Adequate Patient Visual Report: No change from baseline       Perception         Praxis         Pertinent Vitals/Pain Pain Assessment Pain Assessment: Faces Faces Pain Scale: Hurts a little bit Pain Location: R knee Pain Descriptors / Indicators: Discomfort, Aching Pain Intervention(s): Monitored during session, Repositioned, Limited activity within patient's tolerance     Extremity/Trunk Assessment Upper Extremity Assessment Upper Extremity Assessment: Generalized weakness   Lower Extremity Assessment Lower Extremity Assessment: Defer to PT evaluation   Cervical / Trunk Assessment Cervical / Trunk Assessment: Other exceptions Cervical / Trunk Exceptions: large body habitus   Communication Communication Communication: Impaired Factors Affecting Communication: Hearing impaired   Cognition Arousal: Alert Behavior During Therapy: WFL for tasks assessed/performed Cognition: No apparent impairments                               Following commands: Intact       Cueing  General Comments   Cueing Techniques: Verbal cues;Gestural cues  supportive husband present midway through session; HR initially mildly tachy 90-100 at rest, up to 160s with mobility in sinus tach   Exercises     Shoulder Instructions      Home Living Family/patient expects to be discharged to:: Private residence Living Arrangements: Spouse/significant other (husband) Available Help at Discharge: Family;Available 24 hours/day Type of Home: House Home Access: Stairs to enter [employer] of Steps: 2 or 7 (garage; front entrance) Entrance Stairs-Rails: Left Home Layout: One level     Bathroom Shower/Tub: Producer, Television/film/video: Handicapped height     Home Equipment: Rollator (4 wheels);Grab bars - tub/shower;Hand held shower head          Prior  Functioning/Environment Prior Level of Function : Independent/Modified Independent;Driving             Mobility Comments: Pt reports she is a furniture walker within her home and use rollator in the community. Denies fall hx. ADLs Comments: Indep with ADLs/IADLs. Drives.    OT Problem List: Decreased strength;Decreased range of motion;Decreased activity tolerance;Impaired balance (sitting and/or standing);Obesity;Increased edema   OT Treatment/Interventions: Self-care/ADL training;Therapeutic exercise;Energy conservation;DME and/or AE instruction;Therapeutic activities;Patient/family education;Balance training      OT Goals(Current goals can be found in the care plan section)   Acute Rehab OT Goals Patient Stated Goal: get better OT Goal Formulation: With patient/family Time For Goal Achievement: 04/17/24 Potential to Achieve Goals: Good   OT Frequency:  Min 2X/week    Co-evaluation              AM-PAC OT 6 Clicks Daily Activity     Outcome Measure Help from another person eating meals?: None Help from another person taking care of personal grooming?: A Little Help from another person toileting, which includes using toliet, bedpan, or urinal?: A Lot Help from another person bathing (including washing, rinsing, drying)?: A Lot Help from another person to put on and taking off regular upper body clothing?: A Little Help from another person to put on and taking off regular lower body clothing?: Total  6 Click Score: 15   End of Session Equipment Utilized During Treatment: Rollator (4 wheels) (bariatric) Nurse Communication: Mobility status;Other (comment) (pt requesting ice water, pt status at end of OT session)  Activity Tolerance: Patient tolerated treatment well Patient left: in chair;with call bell/phone within reach;with family/visitor present  OT Visit Diagnosis: Unsteadiness on feet (R26.81);Muscle weakness (generalized) (M62.81);Other abnormalities of gait and  mobility (R26.89)                Time: 8640-8582 OT Time Calculation (min): 18 min Charges:  OT General Charges $OT Visit: 1 Visit OT Evaluation $OT Eval Moderate Complexity: 1 Mod  [name], OTR/L [facility] Acute Rehabilitation Services [phone] Secure Chat Preferred  [name] 04/03/2024, 4:57 PM

## 2024-04-03 NOTE — Evaluation (Signed)
 Physical Therapy Evaluation Patient Details Name: [name] MRN: [id number] DOB: 1950/06/05 Today's Date: 04/03/2024  History of Present Illness  Pt is a 73 y.o. female admitted 03/29/24 for concerns of SIRS/severe sepsis, acute kidney injury, and likely diastolic CHF with exacerbation. CXR revealed cardiomegaly and central vascular prominence. PMHx: asthma, breast cancer, HTN, HLD, morbidly obesity, lymphedema, OSA on CPAP, pituitary macroadenoma, persistent atrial fibrillation on Xarelto , and CKD stage IIIb.   Clinical Impression  Pt admitted with above diagnosis. PTA, pt was modI for functional mobility relying on furniture for support in the house and a rollator in the community. She lives with her husband in a one story house with 2 STE. Pt currently with functional limitations due to the deficits listed below (see PT Problem List). She required CGA for sit<>stand using RW and CGA x2 for gait using RW. Pt is currently limited by impaired cardiopulmonary endurance, tachycardia with mobility, and generalized weakness. Pt will benefit from acute skilled PT to increase her independence and safety with mobility to allow discharge. Recommend HHPT to increase strength, improve balance, decrease fall risk, advance activity tolerance, and optimize safety within the home environment.      If plan is discharge home, recommend the following: A little help with walking and/or transfers;A little help with bathing/dressing/bathroom;Assistance with cooking/housework;Assist for transportation;Help with stairs or ramp for entrance   Can travel by private vehicle        Equipment Recommendations None recommended by PT  Recommendations for Other Services       Functional Status Assessment Patient has had a recent decline in their functional status and demonstrates the ability to make significant improvements in function in a reasonable and predictable amount of time.     Precautions / Restrictions  Precautions Precautions: Fall Recall of Precautions/Restrictions: Intact Precaution/Restrictions Comments: Watch HR Restrictions Weight Bearing Restrictions Per Provider Order: No      Mobility  Bed Mobility               General bed mobility comments: Not assessed. Pt greeted seated in recliner chair and returned there at end of session.    Transfers Overall transfer level: Needs assistance Equipment used: Rolling walker (2 wheels) Transfers: Sit to/from Stand Sit to Stand: Contact guard assist           General transfer comment: Pt stood from recliner chair and commode. She utilized momentum to power up into standing. Good eccentric control, reaching back for arm rest.    Ambulation/Gait Ambulation/Gait assistance: Contact guard assist, +2 safety/equipment (Chair Follow) Gait Distance (Feet): 50 Feet (x3, seated rest breaks in between bouts) Assistive device: Rolling walker (2 wheels) Gait Pattern/deviations: Step-through pattern, Decreased stride length, Wide base of support Gait velocity: reduced Gait velocity interpretation: <1.8 ft/sec, indicate of risk for recurrent falls   General Gait Details: Pt ambulated with short steps. She demonstrated increased lateral lean in order to avance opposite LE. Cues for proximity to RW. Cued PLB technique throughout session. Facilitated seated rest breaks in accordance with HR elevation and DOE. Pt navigated room/hallway well, no LOB.  Stairs            Wheelchair Mobility     Tilt Bed    Modified Rankin (Stroke Patients Only)       Balance Overall balance assessment: Needs assistance Sitting-balance support: No upper extremity supported, Feet supported Sitting balance-Leahy Scale: Good     Standing balance support: Bilateral upper extremity supported, During functional activity, Reliant on assistive device  for balance Standing balance-Leahy Scale: Poor Standing balance comment: Pt dependent on RW                              Pertinent Vitals/Pain Pain Assessment Pain Assessment: Faces Faces Pain Scale: Hurts little more Pain Location: BLE, R knee Pain Descriptors / Indicators: Discomfort, Aching Pain Intervention(s): Monitored during session, Limited activity within patient's tolerance, Repositioned    Home Living Family/patient expects to be discharged to:: Private residence Living Arrangements: Spouse/significant other (Husband) Available Help at Discharge: Family;Available 24 hours/day Type of Home: House Home Access: Stairs to enter Entrance Stairs-Rails: Left Entrance Stairs-Number of Steps: 2 or 7 (Garage; Front)   Home Layout: One level Home Equipment: Rollator (4 wheels);Grab bars - tub/shower;Hand held shower head      Prior Function Prior Level of Function : Independent/Modified Independent;Driving             Mobility Comments: Pt reports she is a furniture walker within her home and use rollator in the community. Denies fall hx. ADLs Comments: Indep with ADLs/IADLs. Drives.     Extremity/Trunk Assessment   Upper Extremity Assessment Upper Extremity Assessment: Defer to OT evaluation    Lower Extremity Assessment Lower Extremity Assessment: Generalized weakness    Cervical / Trunk Assessment Cervical / Trunk Assessment: Other exceptions Cervical / Trunk Exceptions: Increased Body Habitus  Communication   Communication Communication: No apparent difficulties    Cognition Arousal: Alert Behavior During Therapy: WFL for tasks assessed/performed   PT - Cognitive impairments: No family/caregiver present to determine baseline, Awareness                       PT - Cognition Comments: Pt A,Ox4. She appears to have decreased insight, relying on PT to facilitate seated rest breaks and cue PLB technique. Pt is eager to mobilize and push herself. Following commands: Intact       Cueing Cueing Techniques: Verbal cues, Gestural cues      General Comments General comments (skin integrity, edema, etc.): At rest, HR 100-110s. During gait, HR increased significantly and quickly into the 130s-140s within the first minute, 160s-170s after a few mins, with maximums of 185bpm and 200bpm. Pt recovered to 110s with seated rest breaks within 2-3 minutes.    Exercises     Assessment/Plan    PT Assessment Patient needs continued PT services  PT Problem List Decreased activity tolerance;Decreased strength;Decreased mobility;Cardiopulmonary status limiting activity;Decreased knowledge of use of DME       PT Treatment Interventions DME instruction;Gait training;Stair training;Functional mobility training;Therapeutic activities;Therapeutic exercise;Balance training;Patient/family education    PT Goals (Current goals can be found in the Care Plan section)  Acute Rehab PT Goals Patient Stated Goal: Return Home PT Goal Formulation: With patient Time For Goal Achievement: 04/17/24 Potential to Achieve Goals: Good    Frequency Min 2X/week     Co-evaluation               AM-PAC PT 6 Clicks Mobility  Outcome Measure Help needed turning from your back to your side while in a flat bed without using bedrails?: A Little Help needed moving from lying on your back to sitting on the side of a flat bed without using bedrails?: A Little Help needed moving to and from a bed to a chair (including a wheelchair)?: A Little Help needed standing up from a chair using your arms (e.g., wheelchair or bedside chair)?:  A Little Help needed to walk in hospital room?: A Little Help needed climbing 3-5 steps with a railing? : A Lot 6 Click Score: 17    End of Session Equipment Utilized During Treatment: Gait belt Activity Tolerance: Patient tolerated treatment well Patient left: in chair;with call bell/phone within reach;with chair alarm set Nurse Communication: Mobility status PT Visit Diagnosis: Muscle weakness (generalized) (M62.81);Other  abnormalities of gait and mobility (R26.89);Unsteadiness on feet (R26.81)    Time: 8881-8854 PT Time Calculation (min) (ACUTE ONLY): 27 min   Charges:   PT Evaluation $PT Eval Moderate Complexity: 1 Mod PT Treatments $Therapeutic Activity: 8-22 mins PT General Charges $$ ACUTE PT VISIT: 1 Visit         Randall SAUNDERS, PT, DPT Acute Rehabilitation Services Office: [phone] Secure Chat Preferred  Delon CHRISTELLA Callander 04/03/2024, 1:27 PM

## 2024-04-04 DIAGNOSIS — R6520 Severe sepsis without septic shock: Secondary | ICD-10-CM | POA: Diagnosis not present

## 2024-04-04 DIAGNOSIS — A419 Sepsis, unspecified organism: Secondary | ICD-10-CM | POA: Diagnosis not present

## 2024-04-04 LAB — RENAL FUNCTION PANEL
Albumin: 2.9 g/dL — ABNORMAL LOW (ref 3.5–5.0)
Anion gap: 12 (ref 5–15)
BUN: 29 mg/dL — ABNORMAL HIGH (ref 8–23)
CO2: 28 mmol/L (ref 22–32)
Calcium: 8.4 mg/dL — ABNORMAL LOW (ref 8.9–10.3)
Chloride: 102 mmol/L (ref 98–111)
Creatinine, Ser: 1.44 mg/dL — ABNORMAL HIGH (ref 0.44–1.00)
GFR, Estimated: 38 mL/min — ABNORMAL LOW (ref >60)
Glucose, Bld: 97 mg/dL (ref 70–99)
Phosphorus: 3.7 mg/dL (ref 2.5–4.6)
Potassium: 3.3 mmol/L — ABNORMAL LOW (ref 3.5–5.1)
Sodium: 142 mmol/L (ref 135–145)

## 2024-04-04 LAB — BASIC METABOLIC PANEL WITH GFR
Anion gap: 12 (ref 5–15)
BUN: 29 mg/dL — ABNORMAL HIGH (ref 8–23)
CO2: 29 mmol/L (ref 22–32)
Calcium: 8.4 mg/dL — ABNORMAL LOW (ref 8.9–10.3)
Chloride: 101 mmol/L (ref 98–111)
Creatinine, Ser: 1.43 mg/dL — ABNORMAL HIGH (ref 0.44–1.00)
GFR, Estimated: 38 mL/min — ABNORMAL LOW (ref >60)
Glucose, Bld: 102 mg/dL — ABNORMAL HIGH (ref 70–99)
Potassium: 3.3 mmol/L — ABNORMAL LOW (ref 3.5–5.1)
Sodium: 142 mmol/L (ref 135–145)

## 2024-04-04 LAB — CBC
HCT: 31.7 % — ABNORMAL LOW (ref 36.0–46.0)
Hemoglobin: 10.4 g/dL — ABNORMAL LOW (ref 12.0–15.0)
MCH: 30.5 pg (ref 26.0–34.0)
MCHC: 32.8 g/dL (ref 30.0–36.0)
MCV: 93 fL (ref 80.0–100.0)
Platelets: 179 K/uL (ref 150–400)
RBC: 3.41 MIL/uL — ABNORMAL LOW (ref 3.87–5.11)
RDW: 15.2 % (ref 11.5–15.5)
WBC: 5.2 K/uL (ref 4.0–10.5)
nRBC: 0 % (ref 0.0–0.2)

## 2024-04-04 MED ORDER — POTASSIUM CHLORIDE 20 MEQ PO PACK
40.0000 meq | PACK | Freq: Once | ORAL | Status: AC
  Administered 2024-04-04: 40 meq via ORAL
  Filled 2024-04-04: qty 2

## 2024-04-04 NOTE — Progress Notes (Signed)
   04/04/24 0852  Spiritual Encounters  Type of Visit Initial  Care provided to: Patient  Referral source Clinical staff  Reason for visit Routine spiritual support  OnCall Visit No  Spiritual Framework  Presenting Themes Impactful experiences and emotions  Community/Connection Family  Patient Stress Factors Exhausted;Loss  Family Stress Factors None identified  Interventions  Spiritual Care Interventions Made Compassionate presence;Established relationship of care and support;Prayer  Intervention Outcomes  Outcomes Awareness of support;Awareness of health;Reduced anxiety;Reduced fear   Chaplain visited Pt who shared that she recently lost her daughter to stomach cancer in March and had just laid her mother to rest at the age of 11 this year. The Pt expressed carrying a deep sense of grief from both losses.  Chaplain provided a supportive presence and offered active listening as the Pt reflected on her experiences. A word of prayer was offered for comfort and strength. The Pt appeared uplifted and encouraged following the visit.  Chaplain provided spiritual and emotional support. Chaplain services remain available as needed.

## 2024-04-04 NOTE — TOC Progression Note (Signed)
 Transition of Care [facility]) - Progression Note    Patient Details  Name: [name] MRN: [id number] Date of Birth: Oct 09, 1950  Transition of [facility]) CM/SW Contact  Tom-Johnson, Starkeisha Vanwinkle Daphne, RN Phone Number: 04/04/2024, 3:39 PM  Clinical Narrative:     CM spoke with patient at bedside about Home Health recommendation, patient states she is not active with any disciplines and has no preference. CM sent referral to [facility] with acceptance noted via HUB, info on AVS.   Patient not Medically ready for discharge.  CM will continue to follow as patient progresses with care towards discharge.                       Expected Discharge Plan and Services                                               Social Drivers of Health (SDOH) Interventions SDOH Screenings   Food Insecurity: No Food Insecurity (03/30/2024)  Housing: Low Risk  (03/30/2024)  Transportation Needs: No Transportation Needs (03/30/2024)  Utilities: Not At Risk (03/30/2024)  Depression (PHQ2-9): Low Risk  (03/28/2024)  Financial Resource Strain: Low Risk  (12/09/2022)  Social Connections: Socially Integrated (03/30/2024)  Tobacco Use: Low Risk  (03/29/2024)  Health Literacy: Adequate Health Literacy (12/09/2022)    Readmission Risk Interventions     No data to display

## 2024-04-04 NOTE — Progress Notes (Signed)
Placed patient on CPAP for the night with pressure set at [place].

## 2024-04-04 NOTE — Progress Notes (Signed)
  Progress Note   Patient: [name] FMW:[id number] DOB: September 17, 1950 DOA: 03/29/2024     5 DOS: the patient was seen and examined on 04/04/2024 at 9:40 AM      Brief hospital course: Patient is a 73 year old female, morbidly obese, with past medical history significant for asthma, breast cancer, hypertension, hyperlipidemia, lymphedema, OSA on CPAP, pituitary macroadenoma, persistent atrial fibrillation on Xarelto  and chronic kidney disease stage IIIb.  Patient presented to the hospital with chills, cough, worsening edema and shortness of breath.  Patient was admitted with concerns for SIRS/severe sepsis, acute kidney injury on chronic kidney disease, likely diastolic CHF with exacerbation.  BNP done on 03/30/2024 was 377.  Patient has completed antibiotics for possible community-acquired pneumonia.  Patient has been on IV Lasix  80 mg twice daily, with significant urine output.  Renal function is monitored daily.  Will continue IV Lasix  for now.   Assessment and Plan: * Severe sepsis (HCC)/possible community-acquired pneumonia: As per prior documentation: Presented with chills, tachycardia, tachypnea, lactate 3.1, hypotension requiring 4L IV fluids, Tbili 2, and infiltrates on CXR. -Patient has completed course of antibiotics. Symptomatically suspect this was pneumonia.    AKI on CKD (chronic kidney disease), stage IIIb (HCC) Baseline Cr 1.3-1.46 AKI is likely multifactorial. Patient has been exposed to contrast dye.   Serum creatinine of 1.44 today.   - Continue to monitor serum creatinine and electrolytes.   - Avoid nephrotoxins.    Acute on chronic diastolic CHF  Pulmonary hypertension - Continue IV Lasix  80 mg twice daily. - Continue to monitor electrolytes and symptoms  Chronic atrial fibrillation (HCC) -Continue to monitor clinically. - Rate controlled  Essential hypertension -Continue to optimize.  Obstructive sleep apnea - Continue CPAP   Pituitary macroadenoma  (HCC) - Continue cabergoline   Breast cancer of lower-inner quadrant of right female breast (HCC)    Class 3 obesity (HCC) BMI 53 Diet and exercise PCP to consider the semaglutide or Mounjaro i.e. if not contraindicated  Normocytic anemia Hemoglobin is stable. 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     Physical Exam: BP (!) 148/86 (BP Location: Right Leg)   Pulse 92   Temp 97.9 F (36.6 C) (Oral)   Resp 20   Ht 5' 2 (1.575 m)   Wt 129.7 kg   SpO2 99%   BMI 52.30 kg/m    General examination: Patient is morbidly obese. Patient is volume depeted NECK: Difficult to assess Lungs: Wheezing, ?From upper airways Neuro - Non focal.  Patient moves extremities Extremities.2-2+ bilateral leg edema  Abdomen: Soft and non tender    Data Reviewed: Basic metabolic panel shows BUN of 30 and serum creatinine of 1.76  CBC shows hemoglobin down to 9.3    Family Communication:     Disposition: Status is: Inpatient  Time spent: 35 minutes.  Author: Leatrice LILLETTE Chapel, MD 04/04/2024 10:32 AM  For on call review [email or web address].

## 2024-04-04 NOTE — Progress Notes (Signed)
 Mo  04/04/24 1050  Mobility  Activity Ambulated with assistance;Pivoted/transferred from bed to chair (to bathroom)  Level of Assistance Contact guard assist, steadying assist  Assistive Device Front wheel walker  Distance Ambulated (ft) 10 ft  Activity Response Tolerated fair  Mobility Referral Yes  Mobility visit 1 Mobility  Mobility Specialist Start Time (ACUTE ONLY) 1050  Mobility Specialist Stop Time (ACUTE ONLY) 1123  Mobility Specialist Time Calculation (min) (ACUTE ONLY) 33 min   Pt agreeable to mobility. Required MinG assistance to safely ambulate to BR. HR increased to 180 bpm w/ exertion, pt asx. Deferred further mobility d/t increased HR. Able to tolerate long periods of standing though for ADLs. Pt transferred to chair and left with all needs met. RN notified.   Lauraine Erm Mobility Specialist Please contact via SecureChat or [employer] [phone]

## 2024-04-04 NOTE — Plan of Care (Signed)
 This patient remains on MC-4E as of time of writing. The patient is AA+Ox4. No supplemental O2 requirement. Single-lumen midline to LUE. Pure Wick removed overnight per orders.   Problem: Fluid Volume: Goal: Hemodynamic stability will improve Outcome: Progressing   Problem: Clinical Measurements: Goal: Diagnostic test results will improve Outcome: Progressing Goal: Signs and symptoms of infection will decrease Outcome: Progressing   Problem: Respiratory: Goal: Ability to maintain adequate ventilation will improve Outcome: Progressing   Problem: Education: Goal: Knowledge of General Education information will improve Description: Including pain rating scale, medication(s)/side effects and non-pharmacologic comfort measures Outcome: Progressing   Problem: Health Behavior/Discharge Planning: Goal: Ability to manage health-related needs will improve Outcome: Progressing   Problem: Clinical Measurements: Goal: Ability to maintain clinical measurements within normal limits will improve Outcome: Progressing Goal: Will remain free from infection Outcome: Progressing Goal: Diagnostic test results will improve Outcome: Progressing Goal: Respiratory complications will improve Outcome: Progressing Goal: Cardiovascular complication will be avoided Outcome: Progressing   Problem: Activity: Goal: Risk for activity intolerance will decrease Outcome: Progressing   Problem: Nutrition: Goal: Adequate nutrition will be maintained Outcome: Progressing   Problem: Coping: Goal: Level of anxiety will decrease Outcome: Progressing   Problem: Elimination: Goal: Will not experience complications related to bowel motility Outcome: Progressing Goal: Will not experience complications related to urinary retention Outcome: Progressing   Problem: Pain Managment: Goal: General experience of comfort will improve and/or be controlled Outcome: Progressing   Problem: Safety: Goal: Ability to  remain free from injury will improve Outcome: Progressing   Problem: Skin Integrity: Goal: Risk for impaired skin integrity will decrease Outcome: Progressing   Problem: Education: Goal: Knowledge of the prescribed therapeutic regimen will improve Outcome: Progressing   Problem: Activity: Goal: Ability to implement measures to reduce episodes of fatigue will improve Outcome: Progressing   Problem: Bowel/Gastric: Goal: Will not experience complications related to bowel motility Outcome: Progressing   Problem: Coping: Goal: Ability to identify and develop effective coping behavior will improve Outcome: Progressing   Problem: Nutritional: Goal: Maintenance of adequate nutrition will improve Outcome: Progressing   Problem: Education: Goal: Knowledge of disease and its progression will improve Outcome: Progressing Goal: Individualized Educational Video(s) Outcome: Progressing   Problem: Fluid Volume: Goal: Compliance with measures to maintain balanced fluid volume will improve Outcome: Progressing   Problem: Health Behavior/Discharge Planning: Goal: Ability to manage health-related needs will improve Outcome: Progressing   Problem: Nutritional: Goal: Ability to make healthy dietary choices will improve Outcome: Progressing   Problem: Clinical Measurements: Goal: Complications related to the disease process, condition or treatment will be avoided or minimized Outcome: Progressing   Problem: Education: Goal: Knowledge of disease or condition will improve Outcome: Progressing Goal: Understanding of medication regimen will improve Outcome: Progressing Goal: Individualized Educational Video(s) Outcome: Progressing   Problem: Activity: Goal: Ability to tolerate increased activity will improve Outcome: Progressing   Problem: Cardiac: Goal: Ability to achieve and maintain adequate cardiopulmonary perfusion will improve Outcome: Progressing   Problem: Health  Behavior/Discharge Planning: Goal: Ability to safely manage health-related needs after discharge will improve Outcome: Progressing   Problem: Education: Goal: Ability to demonstrate management of disease process will improve Outcome: Progressing Goal: Ability to verbalize understanding of medication therapies will improve Outcome: Progressing Goal: Individualized Educational Video(s) Outcome: Progressing   Problem: Activity: Goal: Capacity to carry out activities will improve Outcome: Progressing   Problem: Cardiac: Goal: Ability to achieve and maintain adequate cardiopulmonary perfusion will improve Outcome: Progressing

## 2024-04-04 NOTE — Progress Notes (Signed)
 PT placed on CPAP at this time.   04/04/24 2328  BiPAP/CPAP/SIPAP  BiPAP/CPAP/SIPAP Pt Type Adult  BiPAP/CPAP/SIPAP Resmed  Mask Type Full face mask  Dentures removed? Not applicable  Mask Size Medium  Set Rate 0 breaths/min  Respiratory Rate 16 breaths/min  IPAP 0 cmH20  EPAP 6 cmH2O  Pressure Support 0 cmH20  PEEP 6 cmH20  FiO2 (%) 21 %  Flow Rate 0 lpm  Minute Ventilation 8.1  Leak 0  Peak Inspiratory Pressure (PIP) 12  Tidal Volume (Vt) 481  Patient Home Machine No  Patient Home Mask No  Patient Home Tubing No  Auto Titrate No  Nasal massage performed Yes  CPAP/SIPAP surface wiped down Yes  Device Plugged into [employer] Power Outlet Yes

## 2024-04-05 DIAGNOSIS — A419 Sepsis, unspecified organism: Secondary | ICD-10-CM | POA: Diagnosis not present

## 2024-04-05 DIAGNOSIS — R6520 Severe sepsis without septic shock: Secondary | ICD-10-CM | POA: Diagnosis not present

## 2024-04-05 LAB — RENAL FUNCTION PANEL
Albumin: 3.2 g/dL — ABNORMAL LOW (ref 3.5–5.0)
Anion gap: 15 (ref 5–15)
BUN: 27 mg/dL — ABNORMAL HIGH (ref 8–23)
CO2: 26 mmol/L (ref 22–32)
Calcium: 8.8 mg/dL — ABNORMAL LOW (ref 8.9–10.3)
Chloride: 102 mmol/L (ref 98–111)
Creatinine, Ser: 1.33 mg/dL — ABNORMAL HIGH (ref 0.44–1.00)
GFR, Estimated: 42 mL/min — ABNORMAL LOW (ref >60)
Glucose, Bld: 94 mg/dL (ref 70–99)
Phosphorus: 3.7 mg/dL (ref 2.5–4.6)
Potassium: 3.6 mmol/L (ref 3.5–5.1)
Sodium: 143 mmol/L (ref 135–145)

## 2024-04-05 LAB — MAGNESIUM: Magnesium: 1.9 mg/dL (ref 1.7–2.4)

## 2024-04-05 MED ORDER — AZATHIOPRINE 50 MG PO TABS
100.0000 mg | ORAL_TABLET | Freq: Every day | ORAL | Status: DC
  Administered 2024-04-05 – 2024-04-07 (×3): 100 mg via ORAL
  Filled 2024-04-05 (×3): qty 2

## 2024-04-05 MED ORDER — EMPAGLIFLOZIN 10 MG PO TABS
10.0000 mg | ORAL_TABLET | Freq: Every day | ORAL | Status: DC
  Administered 2024-04-05 – 2024-04-07 (×3): 10 mg via ORAL
  Filled 2024-04-05 (×3): qty 1

## 2024-04-05 MED ORDER — PREDNISOLONE ACETATE 1 % OP SUSP
1.0000 [drp] | Freq: Every day | OPHTHALMIC | Status: DC
  Administered 2024-04-05 – 2024-04-07 (×3): 1 [drp] via OPHTHALMIC
  Filled 2024-04-05: qty 5

## 2024-04-05 MED ORDER — METOPROLOL SUCCINATE ER 50 MG PO TB24
50.0000 mg | ORAL_TABLET | Freq: Two times a day (BID) | ORAL | Status: DC
  Administered 2024-04-05 – 2024-04-07 (×5): 50 mg via ORAL
  Filled 2024-04-05 (×5): qty 1

## 2024-04-05 MED ORDER — SPIRONOLACTONE 12.5 MG HALF TABLET
12.5000 mg | ORAL_TABLET | Freq: Every day | ORAL | Status: DC
  Administered 2024-04-05 – 2024-04-07 (×3): 12.5 mg via ORAL
  Filled 2024-04-05 (×3): qty 1

## 2024-04-05 NOTE — Progress Notes (Signed)
 [facility]   ROUNDING   NOTE [name] FMW:[id number]  DOB: December 14, 1950  DOA: 03/29/2024  PCP: Kathryn Thresa DELENA, MD  04/05/2024,8:02 AM  LOS: 6 days    Code Status: Full code     from: Likely home with home health   73 year old female Known panuveitis followed at [facility] on immunosuppressants Chronic A-fib pulmonary hypertension severe aortic regurg-baseline weight fluctuating anywhere from 232 to 40 pounds secondary to inconsistency taking meds (daughter passed away in 2023-08-13 of this year resulting in prolonged bereavement and inattention to herself according to notes)-some notes of consideration for TAVR for aortic insufficiency per last OV visit 10/07/2023 Chronic atrial fibrillation CHADVASC >4 on Xarelto  Questionable history of multiple myeloma--MRI was NOT suggestive of cardiac amyloidosis with no LGE and no significant ECV percentage elevation Followed for distant history stage IIb invasive breast cancer in 2013 last imaging 02/08/2024 negative  Chronology  11/4 admitted by hospitalist service sepsis 2/2 pneumonia on CT scan +/- acute HF PEF BNP 377 11/7 started on diuretics   Pertinent imaging/studies till date  Chest x-ray    Assessment  & Plan :    Sepsis on admission secondary to pneumonia Completed IV Rx azithromycin ceftriaxone 6 days 11/9-watch for recurrence do not expect to worsen periodic labs x-ray as needed Acute HFpEF in the setting of WHO 1 pulmonary hypertension, probable severe aortic regurg, baseline weight to 40 pounds-currently 261 Still volume overloaded-only -4 L this hospital stay so far, she is still in the 261 pound range when she normally is 235-240 Continues Lasix  80 twice daily IV-add back home Aldactone  25--- if closer to her baseline weight with standing weights (nursing informed) she can probably be switched to oral diuretics in 24 to 48 hours and can eventually discharge home Given she is not as acutely volume overloaded and is -9 L will add back her  metoprolol  see below Chronic A-fib CHADVASC >4 on Xarelto  Resume Xarelto  on admission She is tachycardic today with heart rate in the 120s so starting back metoprolol  at a lower dose of 50 XL twice daily for better rate control Monitor on telemetry check magnesium  in a.m. Uveitis Can resume Imuran 100 daily, eyedrops as necessary prednisolone 1% right eye Pituitary tumor noted in the past Resume cabergoline  0.5 twice daily ?  Multiple myeloma Distant history invasive breast cancer status post mastectomy CKD 3 AA  Data Reviewed today:  Sodium 143 potassium 3.6 BUN/creatinine 27/1.3 Hemoglobin 10.4 platelet 179 WBC 5.2  DVT prophylaxis: Xarelto   Status is: Inpatient Await diuresis and monitor trends    Dispo/Global plan: Likely home with home health once more dry   Time 60   Subjective:   Awake coherent pleasant no cough no cold still feels swollen Willing to wear compression hose Asking to use a pure wick Told her know she has to get up and mobilize she is high functioning at home we explained the rationale of increasing mobility No chest pain No cough    Objective + exam Vitals:   04/04/24 2240 04/05/24 0357 04/05/24 0400 04/05/24 0751  BP: 112/71 (!) 110/56 (!) 110/55 127/83  Pulse:   77 95  Resp: 19 18  18   Temp: 97.8 F (36.6 C) (!) 96.6 F (35.9 C)  97.7 F (36.5 C)  TempSrc: Oral Axillary  Oral  SpO2:   100% 96%  Weight:   120 kg   Height:       Filed Weights   03/30/24 0620 04/03/24 0458 04/05/24 0400  Weight: 131.8 kg 129.7 kg 120 kg     Examination: EOMI NCAT?  JVD at bedside S1-S2 tachycardic PVCs 120s Abdomen soft no rebound no guarding ROM is intact She has trace edema to grade 1-2 edema in lower extremities     Scheduled Meds:  azaTHIOprine  100 mg Oral Daily   cabergoline   0.5 mg Oral Once per day on Monday Thursday   dorzolamide  1 drop Both Eyes BID   empagliflozin   10 mg Oral Daily   furosemide   80 mg Intravenous BID    hydrocortisone cream   Topical BID   latanoprost   1 drop Both Eyes QHS   metoprolol  succinate  50 mg Oral BID   prednisoLONE acetate  1 drop Right Eye Daily   rivaroxaban   20 mg Oral Q supper   simvastatin   20 mg Oral QHS   spironolactone   12.5 mg Oral Daily   Continuous Infusions: acetaminophen  **OR** acetaminophen , albuterol , diphenhydrAMINE , ondansetron  **OR** ondansetron  (ZOFRAN ) IV, mouth rinse, sodium chloride  flush  Jai-Gurmukh Devantae Babe, MD  [facility]

## 2024-04-05 NOTE — Progress Notes (Addendum)
 Occupational Therapy Treatment Patient Details Name: [name] MRN: [id number] DOB: 1951/03/29 Today's Date: 04/05/2024   History of present illness Pt is a 73 y.o. female admitted 03/29/24 for concerns of SIRS/severe sepsis, acute kidney injury, and likely diastolic CHF with exacerbation. CXR revealed cardiomegaly and central vascular prominence. PMHx: asthma, breast cancer, HTN, HLD, morbidly obesity, lymphedema, OSA on CPAP, pituitary macroadenoma, persistent atrial fibrillation on Xarelto , and CKD stage IIIb.   OT comments  Pt OOB walking to bathroom upon OT arrival. Pt educated on fall prevention and safety. Pt participated in functional mobility with RW CGA, clothing mgt CGA transfers to toilet CGA with min verbal cues for safe hand placement and to use grab bar. Pt stood at sink for grooming/hygiene and or care CGA. Pt educated on LB ADL A/E with video demo specifically for sock aid and reacher as well as LH bath sponge and energy conservation strategies with handouts provided. HR in low to mid 90s throughout session. OT will continue to follow acutely to maximize level of function and safety      If plan is discharge home, recommend the following:  A little help with bathing/dressing/bathroom;Assistance with cooking/housework;Assist for transportation;Help with stairs or ramp for entrance   Equipment Recommendations  None recommended by OT    Recommendations for Other Services      Precautions / Restrictions Precautions Precautions: Fall Recall of Precautions/Restrictions: Intact Precaution/Restrictions Comments: monitor HR Restrictions Weight Bearing Restrictions Per Provider Order: No       Mobility Bed Mobility               General bed mobility comments: pt OOB walknig to bathroom upon OT arrival, no staff present    Transfers Overall transfer level: Needs assistance Equipment used: Rolling walker (2 wheels) Transfers: Sit to/from Stand Sit to Stand:  Contact guard assist           General transfer comment: cues for correct hand placement     Balance Overall balance assessment: Needs assistance Sitting-balance support: No upper extremity supported, Feet supported Sitting balance-Leahy Scale: Good     Standing balance support: Bilateral upper extremity supported, During functional activity, Reliant on assistive device for balance Standing balance-Leahy Scale: Poor                             ADL either performed or assessed with clinical judgement   ADL Overall ADL's : Needs assistance/impaired     Grooming: Wash/dry hands;Wash/dry face;Oral care;Contact guard assist;Standing               Lower Body Dressing: Maximal assistance Lower Body Dressing Details (indicate cue type and reason): doffing/donning socks Toilet Transfer: Contact guard assist;Rolling walker (2 wheels);Cueing for safety;Ambulation;Regular Toilet;Grab bars   Toileting- Clothing Manipulation and Hygiene: Minimal assistance;Sit to/from stand       Functional mobility during ADLs: Contact guard assist;Rolling walker (2 wheels);Cueing for safety General ADL Comments: pt educated on LB ADL A/E with video demo specifically for LH bath sponge, sock aid and reacher  as well as energy conservation strategies with handouts provided    Extremity/Trunk Assessment Upper Extremity Assessment Upper Extremity Assessment: Generalized weakness   Lower Extremity Assessment Lower Extremity Assessment: Defer to PT evaluation        Vision Baseline Vision/History: 1 Wears glasses Ability to See in Adequate Light: 0 Adequate Patient Visual Report: No change from baseline     Perception  Praxis     Communication Communication Communication: Impaired   Cognition Arousal: Alert Behavior During Therapy: WFL for tasks assessed/performed                                 Following commands: Intact        Cueing   Cueing  Techniques: Verbal cues  Exercises      Shoulder Instructions       General Comments      Pertinent Vitals/ Pain       Pain Assessment Pain Assessment: No/denies pain Faces Pain Scale: No hurt Pain Intervention(s): Monitored during session, Repositioned  Home Living                                          Prior Functioning/Environment              Frequency  Min 2X/week        Progress Toward Goals  OT Goals(current goals can now be found in the care plan section)  Progress towards OT goals: Progressing toward goals     Plan      Co-evaluation                 AM-PAC OT 6 Clicks Daily Activity     Outcome Measure   Help from another person eating meals?: None Help from another person taking care of personal grooming?: A Little Help from another person toileting, which includes using toliet, bedpan, or urinal?: A Little Help from another person bathing (including washing, rinsing, drying)?: A Lot Help from another person to put on and taking off regular upper body clothing?: A Little Help from another person to put on and taking off regular lower body clothing?: A Lot 6 Click Score: 17    End of Session Equipment Utilized During Treatment: Gait belt;Rolling walker (2 wheels)  OT Visit Diagnosis: Unsteadiness on feet (R26.81);Muscle weakness (generalized) (M62.81);Other abnormalities of gait and mobility (R26.89)   Activity Tolerance Patient tolerated treatment well   Patient Left in chair;with call bell/phone within reach   Nurse Communication Mobility status        Time: 8877-8851 OT Time Calculation (min): 26 min  Charges: OT General Charges $OT Visit: 1 Visit OT Treatments $Self Care/Home Management : 8-22 mins $Therapeutic Activity: 8-22 mins     Jacques Karna Loose 04/05/2024, 12:14 PM

## 2024-04-05 NOTE — Progress Notes (Signed)
 Physical Therapy Treatment Patient Details Name: [name] MRN: [id number] DOB: 07-03-1950 Today's Date: 04/05/2024   History of Present Illness Pt is a 73 y.o. female admitted 03/29/24 for concerns of SIRS/severe sepsis, acute kidney injury, and likely diastolic CHF with exacerbation. CXR revealed cardiomegaly and central vascular prominence. PMHx: asthma, breast cancer, HTN, HLD, morbidly obesity, lymphedema, OSA on CPAP, pituitary macroadenoma, persistent atrial fibrillation on Xarelto , and CKD stage IIIb.    PT Comments  Pt received in chair, agreeable to therapy session, pt denies pain/shortness of breath with increased activity. Pt noted to be tachy to 156 bpm with exertion, RN notified, pt asymptomatic. No SpO2 signal able to be achieved with exertional tasks, RN aware. Defer stair negotiation until following date due to tachycardia, plan to have +2 staff for safety next session for stairs given pt elevated HR and body habitus. Pt continues to benefit from PT services to progress toward functional mobility goals, continue to recommend HHPT upon DC.    If plan is discharge home, recommend the following: A little help with walking and/or transfers;A little help with bathing/dressing/bathroom;Assistance with cooking/housework;Assist for transportation;Help with stairs or ramp for entrance   Can travel by private vehicle        Equipment Recommendations  None recommended by PT    Recommendations for Other Services       Precautions / Restrictions Precautions Precautions: Fall Recall of Precautions/Restrictions: Impaired Precaution/Restrictions Comments: monitor HR, poor signal on pulse ox, RUE restricted/no BP on RUE Restrictions Weight Bearing Restrictions Per Provider Order: No     Mobility  Bed Mobility Overal bed mobility: Needs Assistance Bed Mobility: Sit to Supine       Sit to supine: Min assist   General bed mobility comments: BLE assist over EOB, HOB mostly  flat getting into bed.    Transfers Overall transfer level: Needs assistance Equipment used: Rolling walker (2 wheels) Transfers: Sit to/from Stand Sit to Stand: Contact guard assist           General transfer comment: from chair, cues for use of brakes; pt abandons rollator prior to turning to sit, despite cues not to.    Ambulation/Gait Ambulation/Gait assistance: Contact guard assist Gait Distance (Feet): 150 Feet Assistive device: Rollator (4 wheels) (bari size) Gait Pattern/deviations: Step-through pattern, Decreased stride length, Wide base of support, Trendelenburg       General Gait Details: Pt with wide BOS 2/2 body habitus and noted bil trendelenburg gait. Cues for proximity to RW, standing rest breaks PRN 2/2 DOE 2/4 and tachycardia with exertion. Cued PLB technique, finger sensor not working on either hand, but pt denies feeling too winded. HR max 156 bpm, pt refusing to take seated break on rollator despite encouragement.   Stairs Stairs:  (defer today due to tachycardia with gait)           Wheelchair Mobility     Tilt Bed    Modified Rankin (Stroke Patients Only)       Balance Overall balance assessment: Needs assistance Sitting-balance support: No upper extremity supported, Feet supported Sitting balance-Leahy Scale: Good     Standing balance support: Bilateral upper extremity supported, During functional activity, Reliant on assistive device for balance, Single extremity supported Standing balance-Leahy Scale: Poor Standing balance comment: Fair static standing with single UE support, poor dynamic standing balance unsupported  Communication Communication Communication: Impaired  Cognition Arousal: Alert Behavior During Therapy: WFL for tasks assessed/performed   PT - Cognitive impairments: No family/caregiver present to determine baseline, Other                       PT - Cognition  Comments: Pt A&Ox4. She appears to have decreased insight, relying on PTA to facilitate seated rest breaks and cue PLB technique. Pt is eager to mobilize and push herself. Following commands: Intact      Cueing Cueing Techniques: Verbal cues  Exercises      General Comments General comments (skin integrity, edema, etc.): HR to 156 bpm with exertion; pt with poor insight into need for seated rest breaks; SpO2 not reading.      Pertinent Vitals/Pain Pain Assessment Pain Assessment: No/denies pain Faces Pain Scale: No hurt Pain Intervention(s): Monitored during session, Repositioned    Home Living                          Prior Function            PT Goals (current goals can now be found in the care plan section) Acute Rehab PT Goals Patient Stated Goal: Return Home PT Goal Formulation: With patient Time For Goal Achievement: 04/17/24 Progress towards PT goals: Progressing toward goals    Frequency    Min 2X/week      PT Plan      Co-evaluation              AM-PAC PT 6 Clicks Mobility   Outcome Measure  Help needed turning from your back to your side while in a flat bed without using bedrails?: A Little Help needed moving from lying on your back to sitting on the side of a flat bed without using bedrails?: A Little Help needed moving to and from a bed to a chair (including a wheelchair)?: A Little Help needed standing up from a chair using your arms (e.g., wheelchair or bedside chair)?: A Little Help needed to walk in hospital room?: A Little Help needed climbing 3-5 steps with a railing? : Total 6 Click Score: 16    End of Session Equipment Utilized During Treatment: Gait belt Activity Tolerance: Patient tolerated treatment well Patient left: in bed;with call bell/phone within reach;with bed alarm set;Other (comment) (per RN/pt, she has been sitting either EOB or in chair since ~5am) Nurse Communication: Mobility status;Precautions;Other  (comment) (tachycardia) PT Visit Diagnosis: Muscle weakness (generalized) (M62.81);Other abnormalities of gait and mobility (R26.89);Unsteadiness on feet (R26.81)     Time: 1801-1820 PT Time Calculation (min) (ACUTE ONLY): 19 min  Charges:    $Gait Training: 8-22 mins PT General Charges $$ ACUTE PT VISIT: 1 Visit                     [name] P., PTA Acute Rehabilitation Services Secure Chat Preferred 9a-5:30pm Office: [phone]    [facility] 04/05/2024, 6:41 PM

## 2024-04-05 NOTE — Progress Notes (Signed)
 PT placed on CPAP at this time.   04/05/24 2353  BiPAP/CPAP/SIPAP  BiPAP/CPAP/SIPAP Pt Type Adult  BiPAP/CPAP/SIPAP Resmed  Mask Type Full face mask  Dentures removed? Not applicable  Mask Size Medium  Set Rate 0 breaths/min  Respiratory Rate 16 breaths/min  IPAP 0 cmH20  EPAP 6 cmH2O  Pressure Support 0 cmH20  PEEP 6 cmH20  FiO2 (%) 21 %  Flow Rate 0 lpm  Minute Ventilation 9.1  Leak 0  Peak Inspiratory Pressure (PIP) 13  Tidal Volume (Vt) 501  Patient Home Machine No  Patient Home Mask No  Patient Home Tubing No  Auto Titrate No  Press High Alarm 30 cmH2O  Press Low Alarm 2 cmH2O  Nasal massage performed Yes  CPAP/SIPAP surface wiped down Yes  Device Plugged into RED Power Outlet Yes

## 2024-04-06 DIAGNOSIS — I482 Chronic atrial fibrillation, unspecified: Secondary | ICD-10-CM | POA: Diagnosis not present

## 2024-04-06 DIAGNOSIS — I10 Essential (primary) hypertension: Secondary | ICD-10-CM | POA: Diagnosis not present

## 2024-04-06 DIAGNOSIS — I5033 Acute on chronic diastolic (congestive) heart failure: Secondary | ICD-10-CM | POA: Diagnosis not present

## 2024-04-06 DIAGNOSIS — J189 Pneumonia, unspecified organism: Secondary | ICD-10-CM | POA: Diagnosis not present

## 2024-04-06 LAB — RENAL FUNCTION PANEL
Albumin: 3.3 g/dL — ABNORMAL LOW (ref 3.5–5.0)
Anion gap: 15 (ref 5–15)
BUN: 28 mg/dL — ABNORMAL HIGH (ref 8–23)
CO2: 28 mmol/L (ref 22–32)
Calcium: 8.8 mg/dL — ABNORMAL LOW (ref 8.9–10.3)
Chloride: 100 mmol/L (ref 98–111)
Creatinine, Ser: 1.35 mg/dL — ABNORMAL HIGH (ref 0.44–1.00)
GFR, Estimated: 41 mL/min — ABNORMAL LOW (ref >60)
Glucose, Bld: 99 mg/dL (ref 70–99)
Phosphorus: 4 mg/dL (ref 2.5–4.6)
Potassium: 3.5 mmol/L (ref 3.5–5.1)
Sodium: 143 mmol/L (ref 135–145)

## 2024-04-06 LAB — CBC WITH DIFFERENTIAL/PLATELET
Abs Immature Granulocytes: 0.03 K/uL (ref 0.00–0.07)
Basophils Absolute: 0.1 K/uL (ref 0.0–0.1)
Basophils Relative: 1 %
Eosinophils Absolute: 0.9 K/uL — ABNORMAL HIGH (ref 0.0–0.5)
Eosinophils Relative: 17 %
HCT: 34.4 % — ABNORMAL LOW (ref 36.0–46.0)
Hemoglobin: 11.1 g/dL — ABNORMAL LOW (ref 12.0–15.0)
Immature Granulocytes: 1 %
Lymphocytes Relative: 13 %
Lymphs Abs: 0.7 K/uL (ref 0.7–4.0)
MCH: 30.1 pg (ref 26.0–34.0)
MCHC: 32.3 g/dL (ref 30.0–36.0)
MCV: 93.2 fL (ref 80.0–100.0)
Monocytes Absolute: 0.7 K/uL (ref 0.1–1.0)
Monocytes Relative: 12 %
Neutro Abs: 3.1 K/uL (ref 1.7–7.7)
Neutrophils Relative %: 56 %
Platelets: 186 K/uL (ref 150–400)
RBC: 3.69 MIL/uL — ABNORMAL LOW (ref 3.87–5.11)
RDW: 14.7 % (ref 11.5–15.5)
WBC: 5.4 K/uL (ref 4.0–10.5)
nRBC: 0 % (ref 0.0–0.2)

## 2024-04-06 LAB — MAGNESIUM: Magnesium: 1.9 mg/dL (ref 1.7–2.4)

## 2024-04-06 NOTE — Assessment & Plan Note (Signed)
 Continue with azathioprine, and eye drops

## 2024-04-06 NOTE — Plan of Care (Signed)
 Pt is alert and fully oriented x 4, afebrile, stable hemodynamically, A fib on the monitor. HR is under controlled.  She ambulates independently to the restroom with four wheels walker. Back pain is well tolerated after Tylenol  given. No acute distress overnight. Pt is able to rest well with no major complaints. Plan of care is reviewed. Pt has been progressing. We will continue to monitor.  Problem: Clinical Measurements: Goal: Diagnostic test results will improve Outcome: Progressing Goal: Signs and symptoms of infection will decrease Outcome: Progressing   Problem: Fluid Volume: Goal: Hemodynamic stability will improve Outcome: Progressing   Problem: Respiratory: Goal: Ability to maintain adequate ventilation will improve Outcome: Progressing   Problem: Clinical Measurements: Goal: Ability to maintain clinical measurements within normal limits will improve Outcome: Progressing Goal: Will remain free from infection Outcome: Progressing Goal: Diagnostic test results will improve Outcome: Progressing Goal: Respiratory complications will improve Outcome: Progressing Goal: Cardiovascular complication will be avoided Outcome: Progressing   Problem: Activity: Goal: Risk for activity intolerance will decrease Outcome: Progressing   Problem: Pain Managment: Goal: General experience of comfort will improve and/or be controlled Outcome: Progressing   Wendi Dash, RN

## 2024-04-06 NOTE — Progress Notes (Addendum)
 Progress Note   Patient: [name] FMW:[id number] DOB: Feb 11, 1951 DOA: 03/29/2024     7 DOS: the patient was seen and examined on 04/06/2024   Brief hospital course: [name] was admitted to the hospital with the working diagnosis of pneumonia.   73 y.o. 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  In the ER, appeared to have sepsis syndrome, source suspected to be pneumonia although somewhat unclear. Started on broad spectrum antibiotics.  Due to diffuse itching present PTA, was given epinephrine , Solu-Medrol.   BP 102/68   Pulse 71   Wt 109.2 kg (240 lb 12.8 oz)   SpO2 100%   BMI 44.04 kg/m  Heart with S1 and S2 irregular rate & rhythm. No rubs, gallops or murmurs. Lungs: Clear Abdomen: Soft, obese, nontender, nondistended.  Extremities: No cyanosis, clubbing, rash, edema Neuro: Alert & oriented x 3, moves all 4 extremities w/o difficulty. Affect pleasant.     Assessment and Plan: * CAP (community acquired pneumonia) Complicated with sepsis, present on admission.  Sepsis has resolved.  Patient has completed antibiotic therapy   Acute on chronic diastolic CHF (congestive heart failure) (HCC) Echocardiogram with preserved LV systolic function with EF 70 to 75%, RV systolic function preserved, RVSP 31.5 mmHg, LA with severe dilatation, moderate mitral valve regurgitation, moderate aortic regurgitation   Fluid balance negative since admission -3,513 ml Systolic blood pressure 130 mmHg range  Plan to continue metoprolol , spironolactone  and SGLT 2 inh  Transition to po loop diuretic in am  Essential hypertension Continue blood pressure control with metoprolol  and spironolactone    Chronic atrial fibrillation (HCC) Continue rate control with metoprolol  and anticoagulation with rivaroxaban   Pituitary  macroadenoma (HCC) Continue with  cabergoline   CKD (chronic kidney disease), stage IIIb (HCC) AKI, hypokalemia   Renal function with serum cr at 1,35 with K at 3.5 with serum bicarbonate at 28  Na 143   Transition to po loop diuretic in am Follow up renal function and electrolytes   Class 3 obesity (HCC) BMI 53, complicates care  Mixed hyperlipidemia - Continue simvastatin   Obstructive sleep apnea - Continue CPAP  Panuveitis Continue with azathioprine, and eye drops   Breast cancer of lower-inner quadrant of right female breast (HCC)        Subjective: Patient with no chest pain, dyspnea and edema are improving, no cough   Physical Exam: Vitals:   04/06/24 0332 04/06/24 0904 04/06/24 1237 04/06/24 1631  BP: (!) 113/58 (!) 153/121 130/77   Pulse: 67 65 72   Resp: 16 16 15    Temp: 98.2 F (36.8 C) 97.8 F (36.6 C) 97.9 F (36.6 C)   TempSrc: Oral Oral Oral   SpO2: 100% 100% 96%   Weight: 115.9 kg   116.8 kg  Height:       Neurology awake and alert ENT with mild pallor Cardiovascular with S1 and S2 present and regular with no gallops, rubs or murmurs Respiratory with mild rales at bases with no wheezing or rhonchi  Abdomen with no distention  Lower extremity edema + with positive lymphedema   Data Reviewed:    Family Communication: no family at the bedside   Disposition: Status is: Inpatient Remains inpatient appropriate because: recovering   Planned Discharge Destination: Home     Author: Elidia Toribio Furnace, MD 04/06/2024 5:29 PM  For on call review [email or web address].

## 2024-04-06 NOTE — Progress Notes (Signed)
 Physical Therapy Treatment Patient Details Name: [name] MRN: [id number] DOB: May 09, 1951 Today's Date: 04/06/2024   History of Present Illness Pt is a 73 y.o. female admitted 03/29/24 for concerns of SIRS/severe sepsis, acute kidney injury, and likely diastolic CHF with exacerbation. CXR revealed cardiomegaly and central vascular prominence. PMHx: asthma, breast cancer, HTN, HLD, morbidly obesity, lymphedema, OSA on CPAP, pituitary macroadenoma, persistent atrial fibrillation on Xarelto , and CKD stage IIIb.    PT Comments  Pt received in recliner, agreeable to work with SPTA. Pt was able to begin stair training today with 7in platform step in hallway. Pt required verbal cues for sequencing on steps to increase safety and decrease pain in LLE. Pt could benefit from continued efforts on stair training with only L rail to simulate home environment. Pt would also benefit from having +2 assist for stairs mgmt at home at this point. Pt HR 145 with exertion requiring 2 standing rest breaks to decrease back into the 100-110bpm range. Pt returned to chair and NT was in the room to check vitals. Pt continues to benefit from PT services to progress toward functional mobility goals.     If plan is discharge home, recommend the following: A little help with walking and/or transfers;A little help with bathing/dressing/bathroom;Assistance with cooking/housework;Assist for transportation;Help with stairs or ramp for entrance   Can travel by private vehicle        Equipment Recommendations  None recommended by PT    Recommendations for Other Services       Precautions / Restrictions Precautions Precautions: Fall Recall of Precautions/Restrictions: Impaired Precaution/Restrictions Comments: monitor HR, poor signal on pulse ox, RUE restricted/no BP on RUE Restrictions Weight Bearing Restrictions Per Provider Order: No     Mobility  Bed Mobility               General bed mobility comments:  Received in recliner    Transfers Overall transfer level: Needs assistance Equipment used: Rolling walker (2 wheels) Transfers: Sit to/from Stand, Bed to chair/wheelchair/BSC Sit to Stand: Supervision   Step pivot transfers: Supervision       General transfer comment: from chair to commode, verbal cues to guide patient on safe techniques for transfering up and down form surfaces.    Ambulation/Gait Ambulation/Gait assistance: Contact guard assist Gait Distance (Feet): 140 Feet (standing break x2 to achieve distance due to increased HR) Assistive device: Rolling walker (2 wheels) bariatric walker Gait Pattern/deviations: Step-through pattern, Trendelenburg, Wide base of support, Decreased stride length Gait velocity: reduced     General Gait Details: Highest observed HR 145bpm decreasing to 100-110 with standing rest break, pt ambulated around 15 ft before HR increased again to 136 requiring another standing rest break before making it back to room to sit down. Pt is CGA requiring verbal ques to stay within her walker during ambulation   Stairs Stairs: Yes Stairs assistance: +2 physical assistance, Min assist, Mod assist Stair Management: Two rails, Step to pattern, Backwards, Forwards Number of Stairs: 3 General stair comments: Pt requiring verbal cues for stair pattern to improve safety and decrease pain. Pt used both hand rails after intruction to only use the L one.   Wheelchair Mobility     Tilt Bed    Modified Rankin (Stroke Patients Only)       Balance Overall balance assessment: Needs assistance Sitting-balance support: No upper extremity supported, Feet supported Sitting balance-Leahy Scale: Good Sitting balance - Comments: seated unsupported in chair   Standing balance support: Bilateral upper  extremity supported, During functional activity, Reliant on assistive device for balance, Single extremity supported Standing balance-Leahy Scale: Poor                               Communication Communication Communication: No apparent difficulties Factors Affecting Communication: Hearing impaired  Cognition Arousal: Alert Behavior During Therapy: WFL for tasks assessed/performed   PT - Cognitive impairments: No family/caregiver present to determine baseline, Other                         Following commands: Intact      Cueing Cueing Techniques: Verbal cues  Exercises General Exercises - Lower Extremity Long Arc Quad: AROM (10 reps)    General Comments General comments (skin integrity, edema, etc.): HR to 145bpm with exertion; pt denied symptoms during increased HR, SpO2 not reading. After sitting down pt stated she did feel a little lightheaded whilw walking      Pertinent Vitals/Pain Pain Assessment Pain Assessment: 0-10 Pain Score: 5  Pain Location: L knee Pain Descriptors / Indicators: Discomfort, Aching Pain Intervention(s): Limited activity within patient's tolerance, Monitored during session    Home Living                          Prior Function            PT Goals (current goals can now be found in the care plan section) Acute Rehab PT Goals Patient Stated Goal: Return Home PT Goal Formulation: With patient Time For Goal Achievement: 04/17/24 Progress towards PT goals: Progressing toward goals    Frequency    Min 2X/week      PT Plan      Co-evaluation              AM-PAC PT 6 Clicks Mobility   Outcome Measure  Help needed turning from your back to your side while in a flat bed without using bedrails?: A Little Help needed moving from lying on your back to sitting on the side of a flat bed without using bedrails?: A Little Help needed moving to and from a bed to a chair (including a wheelchair)?: A Little Help needed standing up from a chair using your arms (e.g., wheelchair or bedside chair)?: A Little Help needed to walk in hospital room?: A Little Help needed  climbing 3-5 steps with a railing? : A Lot 6 Click Score: 17    End of Session Equipment Utilized During Treatment: Gait belt Activity Tolerance: Patient tolerated treatment well Patient left: in chair;with call bell/phone within reach;with nursing/sitter in room Nurse Communication: Mobility status;Precautions;Other (comment) PT Visit Diagnosis: Muscle weakness (generalized) (M62.81);Other abnormalities of gait and mobility (R26.89);Unsteadiness on feet (R26.81)     Time: 8386-8364  PT Time Calculation (min) (ACUTE ONLY): 22 min  Charges:    $Gait Training: 8-22 mins PT General Charges $$ ACUTE PT VISIT: 1 Visit                     Johnnie Gaynelle JACQUE Johnnie Branton Einstein 04/06/2024, 5:14 PM

## 2024-04-07 ENCOUNTER — Other Ambulatory Visit (HOSPITAL_COMMUNITY): Payer: Self-pay

## 2024-04-07 DIAGNOSIS — H44119 Panuveitis, unspecified eye: Secondary | ICD-10-CM

## 2024-04-07 DIAGNOSIS — J189 Pneumonia, unspecified organism: Secondary | ICD-10-CM | POA: Diagnosis not present

## 2024-04-07 DIAGNOSIS — N1832 Chronic kidney disease, stage 3b: Secondary | ICD-10-CM

## 2024-04-07 DIAGNOSIS — I482 Chronic atrial fibrillation, unspecified: Secondary | ICD-10-CM | POA: Diagnosis not present

## 2024-04-07 DIAGNOSIS — I5033 Acute on chronic diastolic (congestive) heart failure: Secondary | ICD-10-CM | POA: Diagnosis not present

## 2024-04-07 DIAGNOSIS — I10 Essential (primary) hypertension: Secondary | ICD-10-CM | POA: Diagnosis not present

## 2024-04-07 DIAGNOSIS — Z23 Encounter for immunization: Secondary | ICD-10-CM | POA: Diagnosis present

## 2024-04-07 LAB — BASIC METABOLIC PANEL WITH GFR
Anion gap: 12 (ref 5–15)
BUN: 35 mg/dL — ABNORMAL HIGH (ref 8–23)
CO2: 29 mmol/L (ref 22–32)
Calcium: 8.7 mg/dL — ABNORMAL LOW (ref 8.9–10.3)
Chloride: 98 mmol/L (ref 98–111)
Creatinine, Ser: 1.62 mg/dL — ABNORMAL HIGH (ref 0.44–1.00)
GFR, Estimated: 33 mL/min — ABNORMAL LOW (ref >60)
Glucose, Bld: 105 mg/dL — ABNORMAL HIGH (ref 70–99)
Potassium: 3.7 mmol/L (ref 3.5–5.1)
Sodium: 139 mmol/L (ref 135–145)

## 2024-04-07 LAB — MAGNESIUM: Magnesium: 2 mg/dL (ref 1.7–2.4)

## 2024-04-07 MED ORDER — TORSEMIDE 20 MG PO TABS: 40.0000 mg | ORAL_TABLET | Freq: Two times a day (BID) | ORAL | Status: DC

## 2024-04-07 MED ORDER — METOPROLOL SUCCINATE ER 50 MG PO TB24
75.0000 mg | ORAL_TABLET | Freq: Two times a day (BID) | ORAL | 0 refills | Status: AC
  Filled 2024-04-07: qty 90, 30d supply, fill #0

## 2024-04-07 MED ORDER — TORSEMIDE 20 MG PO TABS
40.0000 mg | ORAL_TABLET | Freq: Two times a day (BID) | ORAL | 0 refills | Status: DC
  Filled 2024-04-07: qty 120, 30d supply, fill #0

## 2024-04-07 NOTE — Discharge Summary (Signed)
 Physician Discharge Summary   Patient: [name] MRN: [id number] DOB: 1950/07/02  Admit date:     03/29/2024  Discharge date: 04/07/24  Discharge Physician: [name] [name]   PCP: Kathryn Thresa DELENA, MD   Recommendations at discharge:    Increased torsemide  to 40 mg po bid to increase diuresis, holding on acetazolamide and K supplements for now Continue guideline directed medical therapy for heart failure with spironolactone , SGLT 2 inh and metoprolol . No entresto due to history of angioedema with ace inh.  Follow up renal function and electrolytes in 7 days as outpatient Follow up with Dr Kathryn in 7 to 10 days Follow up with Cardiology as scheduled.   Discharge Diagnoses: Principal Problem:   CAP (community acquired pneumonia) Active Problems:   Acute on chronic diastolic CHF (congestive heart failure) (HCC)   Essential hypertension   Chronic atrial fibrillation (HCC)   Pituitary macroadenoma (HCC)   CKD (chronic kidney disease), stage IIIb (HCC)   Class 3 obesity (HCC)   Mixed hyperlipidemia   Obstructive sleep apnea   Breast cancer of lower-inner quadrant of right female breast (HCC)   Panuveitis  Resolved Problems:   * No resolved hospital problems. [facility] Course: [name] was admitted to the hospital with the working diagnosis of heart failure exacerbation in the setting of pneumonia.   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  She called EMS, her vital sings were stable and she was transported to the ED.  In the emergency room her temp was 98.1, blood pressure 87/61, HR 54, RR 22 and 02 saturation 100%  Dry mucous membranes, heart with S1 and S2 present, irregularly irregular, abdomen soft and not distended, and trace lower extremity edema.   Na 141, K 4.6 Cl 103  bicarbonate 26 glucose 100 bun 23 cr 1.48  Wbc 5,8 hgb 11.9 plt 188   Chest radiograph with hypoinflation, right rotation, positive cardiomegaly with bilateral hilar vascular congestion, interstitial infiltrate at the right mid lung.   EKG 85 bpm, normal axis, normal intervals, qtc 446, atrial fibrillation with no significant ST segment or T wave changes.   Patient was placed on IV antibiotic therapy  11/07 noted worsening edema and increased dyspnea, placed on IV furosemide  for diuresis.   Assessment and Plan: * CAP (community acquired pneumonia) Complicated with severe sepsis, present on admission.  Sepsis has resolved.  Patient has completed antibiotic therapy   Acute on chronic diastolic CHF (congestive heart failure) (HCC) Echocardiogram with preserved LV systolic function with EF 70 to 75%, RV systolic function preserved, RVSP 31.5 mmHg, LA with severe dilatation, moderate mitral valve regurgitation, moderate aortic regurgitation   Pulmonary hypertension Acute on chronic cor pulmonale.   Patient was placed on IV furosemide  for diuresis, negative fluid balance was achieved, -10,095 ml, with significant improvement in her symptoms.  Patient lost about 14 Kg during this hospitalization.   Plan to continue metoprolol , spironolactone  and SGLT 2 inh  Continue torsemide  at home.  No entresto due to angioedema with ace inh.  Follow up with Cardiology as outpatient.   Essential hypertension Continue blood pressure control with metoprolol  and spironolactone    Chronic atrial fibrillation (HCC) Continue rate control with metoprolol  and anticoagulation with rivaroxaban   Pituitary macroadenoma (HCC) Continue with  cabergoline   CKD (chronic kidney disease), stage IIIb (HCC) AKI, hypokalemia   At  the time of discharge her serum cr is 1,62 with K at 3,7 and serum bicarbonate at 29  Na 139  Continue diuresis at home with spironolactone , SLGT 2 inh and loop diuretic Follow up renal  function and electrolytes as outpatient.   Class 3 obesity (HCC) Calculated BMI is 46.8   Mixed hyperlipidemia Continue with simvastatin    Obstructive sleep apnea - Continue CPAP  Panuveitis Continue with azathioprine, and eye drops   Breast cancer of lower-inner quadrant of right female breast (HCC) Follow up as outpatient It has been in remission         Consultants: none  Procedures performed: none   Disposition: Home Diet recommendation:  Cardiac diet DISCHARGE MEDICATION: Allergies as of 04/07/2024       Reactions   Trandolapril-verapamil Hcl Er Swelling   Causes lips to swell [name] is the name brand) Tolerates amlodipine         Medication List     STOP taking these medications    acetaZOLAMIDE 250 MG tablet Commonly known as: DIAMOX   potassium chloride  SA 20 MEQ tablet Commonly known as: KLOR-CON  M       TAKE these medications    acetaminophen  325 MG tablet Commonly known as: TYLENOL  Take 650 mg by mouth daily as needed for mild pain (pain score 1-3) or moderate pain (pain score 4-6).   albuterol  108 (90 Base) MCG/ACT inhaler Commonly known as: ProAir  HFA Inhale 1-2 puffs into the lungs every 6 (six) hours as needed for wheezing or shortness of breath. For shortness of breath   azaTHIOprine 50 MG tablet Commonly known as: IMURAN Take 100 mg by mouth daily.   cabergoline  0.5 MG tablet Commonly known as: DOSTINEX  Take 0.5 mg by mouth 2 (two) times a week. Tuesday and Thursday   dorzolamide 2 % ophthalmic solution Commonly known as: TRUSOPT Place 1 drop into both eyes 2 (two) times daily.   empagliflozin  10 MG Tabs tablet Commonly known as: Jardiance  Take 1 tablet (10 mg total) by mouth daily.   GLUCOSAMINE HCL PO Take 15 mg by mouth daily.   latanoprost  0.005 % ophthalmic solution Commonly known as: XALATAN  Place 1 drop into both eyes at bedtime.   metoprolol  succinate 50 MG 24 hr tablet Commonly known as: TOPROL -XL Take  1.5 tablets (75 mg total) by mouth 2 (two) times daily.   multivitamin with minerals Tabs tablet Take 1 tablet by mouth daily with breakfast.   prednisoLONE acetate 1 % ophthalmic suspension Commonly known as: PRED FORTE Place 1 drop into the right eye daily.   rivaroxaban  20 MG Tabs tablet Commonly known as: Xarelto  Take 1 tablet (20 mg total) by mouth daily with supper.   simvastatin  20 MG tablet Commonly known as: ZOCOR  TAKE 1 TABLET AT BEDTIME   spironolactone  25 MG tablet Commonly known as: ALDACTONE  Take 1 tablet (25 mg total) by mouth daily.   torsemide  20 MG tablet Commonly known as: DEMADEX  Take 2 tablets (40 mg total) by mouth 2 (two) times daily. Start taking on: April 08, 2024 What changed:  how much to take when to take this   vitamin B-12 500 MCG tablet Commonly known as: CYANOCOBALAMIN  Take 500 mcg by mouth daily.   Vitamin D3 25 MCG (1000 UT) Caps Take 1,000 Units by mouth every other day.        Discharge Exam: Filed Weights   04/06/24 0332 04/06/24 1631 04/07/24 0500  Weight: 115.9 kg 116.8 kg 116.1 kg   BP 111/71 (  BP Location: Left Wrist)   Pulse (!) 55   Temp 97.6 F (36.4 C) (Oral)   Resp 18   Ht 5' 2 (1.575 m)   Wt 116.1 kg   SpO2 100%   BMI 46.81 kg/m   Patient is feeling better, dyspnea and edema have improved, no chest pain, no PND, or orthopnea.   Neurology awake and alert ENT with mild pallor Cardiovascular with S1 and S2 present, irregularly irregular with no gallops, rubs or murmurs Respiratory with no rales, wheezing or rhonchi  Abdomen soft and non tender Lower extremity with trace pitting edema, positive component of lymphedema   Condition at discharge: stable  The results of significant diagnostics from this hospitalization (including imaging, microbiology, ancillary and laboratory) are listed below for reference.   Imaging Studies: DG Chest 2 View Result Date: 03/31/2024 EXAM: 2 VIEW(S) XRAY OF THE CHEST  03/31/2024 02:04:03 PM COMPARISON: 03/29/2024 CLINICAL HISTORY: Pneumonia FINDINGS: LUNGS AND PLEURA: No focal pulmonary opacity. Central vascular prominence without overt edema. No pleural effusion. No pneumothorax. HEART AND MEDIASTINUM: Cardiomegaly. No acute abnormality of the mediastinal silhouette. BONES AND SOFT TISSUES: Thoracic degenerative changes. IMPRESSION: 1. No acute cardiopulmonary process. 2. Cardiomegaly and central vascular prominence without overt edema. Electronically signed by: Waddell Calk MD 03/31/2024 04:17 PM EST RP Workstation: [id number]   CT CHEST ABDOMEN PELVIS W CONTRAST Result Date: 03/29/2024 EXAM: CT CHEST, ABDOMEN AND PELVIS WITH CONTRAST 03/29/2024 06:54:44 PM TECHNIQUE: CT of the chest, abdomen and pelvis was performed with the administration of 75 mL of iohexol (OMNIPAQUE) 350 MG/ML injection. Multiplanar reformatted images are provided for review. Automated exposure control, iterative reconstruction, and/or weight based adjustment of the mA/kV was utilized to reduce the radiation dose to as low as reasonably achievable. COMPARISON: None available. CLINICAL HISTORY: Sepsis. FINDINGS: CHEST: MEDIASTINUM AND LYMPH NODES: Mild cardiomegaly. Main pulmonary artery dilated at 3.9 cm. Scattered coronary artery and aortic atherosclerosis. Pericardium is unremarkable. The central airways are clear. No mediastinal, hilar or axillary lymphadenopathy. LUNGS AND PLEURA: No focal consolidation or pulmonary edema. No pleural effusion or pneumothorax. ABDOMEN AND PELVIS: LIVER: The liver is unremarkable. GALLBLADDER AND BILE DUCTS: Gallbladder is unremarkable. No biliary ductal dilatation. SPLEEN: No acute abnormality. PANCREAS: No acute abnormality. ADRENAL GLANDS: No acute abnormality. KIDNEYS, URETERS AND BLADDER: No stones in the kidneys or ureters. No hydronephrosis. No perinephric or periureteral stranding. Urinary bladder is unremarkable. GI AND BOWEL: Stomach demonstrates no acute  abnormality. There is no bowel obstruction. REPRODUCTIVE ORGANS: Prior hysterectomy. PERITONEUM AND RETROPERITONEUM: No ascites. No free air. VASCULATURE: Aorta is normal in caliber. Aortic atherosclerosis. ABDOMINAL AND PELVIS LYMPH NODES: No lymphadenopathy. BONES AND SOFT TISSUES: No acute osseous abnormality. Prior right mastectomy. No other focal soft tissue abnormality. IMPRESSION: 1. No acute abnormality of the chest, abdomen, or pelvis to explain sepsis. 2. Dilated main pulmonary artery measuring 3.9 cm, which can be seen with pulmonary hypertension. Electronically signed by: Franky Crease MD 03/29/2024 07:03 PM EST RP Workstation: [id number]   DG Chest Portable 1 View Result Date: 03/29/2024 CLINICAL DATA:  Chills. EXAM: PORTABLE CHEST 1 VIEW COMPARISON:  Chest radiograph dated 05/22/2022. FINDINGS: Cardiomegaly with vascular congestion. Right mid to lower lung field interstitial densities may represent congestion or infiltrate. No focal consolidation, pleural effusion, pneumothorax. No acute osseous pathology. IMPRESSION: Cardiomegaly with vascular congestion. Superimposed pneumonia in the right lung is not excluded. Electronically Signed   By: Vanetta Chou M.D.   On: 03/29/2024 14:36    Microbiology: Results for orders placed or  performed during the hospital encounter of 03/29/24  Culture, blood (routine x 2)     Status: None   Collection Time: 03/29/24  2:20 PM   Specimen: BLOOD  Result Value Ref Range Status   Specimen Description BLOOD LEFT ANTECUBITAL  Final   Special Requests   Final    BOTTLES DRAWN AEROBIC ONLY Blood Culture results may not be optimal due to an inadequate volume of blood received in culture bottles   Culture   Final    NO GROWTH 5 DAYS Performed at [facility], [address], [place], [place] [address]    Report Status 04/03/2024 FINAL  Final  Culture, blood (routine x 2)     Status: None   Collection Time: 03/29/24  2:30 PM   Specimen: BLOOD LEFT  FOREARM  Result Value Ref Range Status   Specimen Description BLOOD LEFT FOREARM  Final   Special Requests   Final    BOTTLES DRAWN AEROBIC AND ANAEROBIC Blood Culture results may not be optimal due to an inadequate volume of blood received in culture bottles   Culture   Final    NO GROWTH 5 DAYS Performed at [facility], [address], [place], [place] [address]    Report Status 04/03/2024 FINAL  Final  MRSA Next Gen by PCR, Nasal     Status: None   Collection Time: 03/29/24 10:01 PM   Specimen: Nasal Mucosa; Nasal Swab  Result Value Ref Range Status   MRSA by PCR Next Gen NOT DETECTED NOT DETECTED Final    Comment: (NOTE) The GeneXpert MRSA Assay (FDA approved for NASAL specimens only), is one component of a comprehensive MRSA colonization surveillance program. It is not intended to diagnose MRSA infection nor to guide or monitor treatment for MRSA infections. Test performance is not FDA approved in patients less than 79 years old. Performed at [facility], [address] [place], [place] [address]   Resp panel by RT-PCR (RSV, Flu A&B, Covid) Nasal Mucosa     Status: None   Collection Time: 03/29/24 10:01 PM   Specimen: Nasal Mucosa; Nasal Swab  Result Value Ref Range Status   SARS Coronavirus 2 by RT PCR NEGATIVE NEGATIVE Final   Influenza A by PCR NEGATIVE NEGATIVE Final   Influenza B by PCR NEGATIVE NEGATIVE Final    Comment: (NOTE) The Xpert Xpress SARS-CoV-2/FLU/RSV plus assay is intended as an aid in the diagnosis of influenza from Nasopharyngeal swab specimens and should not be used as a sole basis for treatment. Nasal washings and aspirates are unacceptable for Xpert Xpress SARS-CoV-2/FLU/RSV testing.  Fact Sheet for Patients: bloggercourse.com  Fact Sheet for Healthcare Providers: seriousbroker.it  This test is not yet approved or cleared by the [place]  FDA and has been authorized for  detection and/or diagnosis of SARS-CoV-2 by FDA under an Emergency Use Authorization (EUA). This EUA will remain in effect (meaning this test can be used) for the duration of the COVID-19 declaration under Section 564(b)(1) of the Act, 21 U.S.C. section 360bbb-3(b)(1), unless the authorization is terminated or revoked.     Resp Syncytial Virus by PCR NEGATIVE NEGATIVE Final    Comment: (NOTE) Fact Sheet for Patients: bloggercourse.com  Fact Sheet for Healthcare Providers: seriousbroker.it  This test is not yet approved or cleared by the [place]  FDA and has been authorized for detection and/or diagnosis of SARS-CoV-2 by FDA under an Emergency Use Authorization (EUA). This EUA will remain in effect (meaning  this test can be used) for the duration of the COVID-19 declaration under Section 564(b)(1) of the Act, 21 U.S.C. section 360bbb-3(b)(1), unless the authorization is terminated or revoked.  Performed at [facility], [address], [place], [place] [address]     Labs: CBC: Recent Labs  Lab 04/01/24 0500 04/02/24 0404 04/03/24 0411 04/04/24 0444 04/06/24 0515  WBC 6.1 4.9 4.8 5.2 5.4  NEUTROABS  --   --   --   --  3.1  HGB 9.3* 9.9* 10.1* 10.4* 11.1*  HCT 30.3* 31.2* 31.4* 31.7* 34.4*  MCV 96.8 95.4 92.6 93.0 93.2  PLT 178 177 211 179 186   Basic Metabolic Panel: Recent Labs  Lab 04/02/24 0404 04/03/24 0411 04/04/24 0444 04/05/24 0445 04/06/24 0515 04/06/24 0525 04/07/24 0432  NA 141  142 143  140 142  142 143  --  143 139  K 3.8  3.8 3.7  3.5 3.3*  3.3* 3.6  --  3.5 3.7  CL 107  105 103  101 102  101 102  --  100 98  CO2 24  23 27  26 28  29 26   --  28 29  GLUCOSE 90  93 98  96 97  102* 94  --  99 105*  BUN 30*  31* 30*  31* 29*  29* 27*  --  28* 35*  CREATININE 1.76*  1.69* 1.60*  1.59* 1.44*  1.43* 1.33*  --  1.35* 1.62*  CALCIUM 8.3*  8.5* 8.5*  8.3* 8.4*  8.4* 8.8*   --  8.8* 8.7*  MG  --   --   --  1.9 1.9  --  2.0  PHOS 4.2 4.0 3.7 3.7  --  4.0  --    Liver Function Tests: Recent Labs  Lab 04/02/24 0404 04/03/24 0411 04/04/24 0444 04/05/24 0445 04/06/24 0525  ALBUMIN 3.1* 3.0* 2.9* 3.2* 3.3*   CBG: No results for input(s): GLUCAP in the last 168 hours.  Discharge time spent: greater than 30 minutes.  Signed: Elidia Toribio Furnace, MD [facility] 04/07/2024

## 2024-04-07 NOTE — Progress Notes (Addendum)
 Reviewed AVS, patient expressed understanding of medications, MD follow up reviewed.   Removed IV, Site clean, dry and intact.  CCMD contacted and informed patients is being discharged.  Patient states all belongings brought to the hospital at time of admission are accounted for and packed to take home.  Picked up medications from [facility] pharmacy. [facility] contacted to transport patient to entrance A, where family member was waiting in vehicle to transport home.

## 2024-04-07 NOTE — TOC Transition Note (Signed)
 Transition of Care [facility]) - Discharge Note   Patient Details  Name: [name] MRN: [id number] Date of Birth: 21-Jun-1950  Transition of Care [facility]) CM/SW Contact:  Tom-Johnson, Harvest Muskrat, RN Phone Number: 04/07/2024, 2:19 PM   Clinical Narrative:     Patient is scheduled for discharge today.  Readmission Risk Assessment done. Home health info, outpatient f/u, hospital f/u and discharge instructions on AVS. Prescriptions sent to [facility] pharmacy and patient will receive meds prior discharge. Husband, Johnnie to transport at discharge.  No further ICM needs noted.      Final next level of care: Home w Home Health Services Barriers to Discharge: Barriers Resolved   Patient Goals and CMS Choice Patient states their goals for this hospitalization and ongoing recovery are:: To return home CMS Medicare.gov Compare Post Acute Care list provided to:: Patient Choice offered to / list presented to : Patient, Spouse      Discharge Placement                Patient to be transferred to facility by: Husband Name of family member notified: Johnnie    Discharge Plan and Services Additional resources added to the After Visit Summary for                  DME Arranged: N/A DME Agency: NA       HH Arranged: PT, OT, RN, Nurse's Aide HH Agency: [facility] Date [facility] Agency Contacted: 04/04/24 Time HH Agency Contacted: 1525 Representative spoke with at [facility] Agency: [place]  Social Drivers of Health (SDOH) Interventions SDOH Screenings   Food Insecurity: No Food Insecurity (03/30/2024)  Housing: Low Risk  (03/30/2024)  Transportation Needs: No Transportation Needs (03/30/2024)  Utilities: Not At Risk (03/30/2024)  Depression (PHQ2-9): Low Risk  (03/28/2024)  Financial Resource Strain: Low Risk  (12/09/2022)  Social Connections: Socially Integrated (03/30/2024)  Tobacco Use: Low Risk  (03/29/2024)  Health Literacy: Adequate Health Literacy (12/09/2022)      Readmission Risk Interventions    04/07/2024    2:18 PM  Readmission Risk Prevention Plan  Transportation Screening Complete  PCP or Specialist Appt within 3-5 Days Complete  HRI or Home Care Consult Complete  Social Work Consult for Recovery Care Planning/Counseling Complete  Palliative Care Screening Not Applicable  Medication Review Oceanographer) Referral to Pharmacy

## 2024-04-07 NOTE — Progress Notes (Signed)
 Occupational Therapy Treatment Patient Details Name: [name] MRN: [id number] DOB: 1951-04-30 Today's Date: 04/07/2024   History of present illness Pt is a 73 y.o. female admitted 03/29/24 for concerns of SIRS/severe sepsis, acute kidney injury, and likely diastolic CHF with exacerbation. CXR revealed cardiomegaly and central vascular prominence. PMHx: asthma, breast cancer, HTN, HLD, morbidly obesity, lymphedema, OSA on CPAP, pituitary macroadenoma, persistent atrial fibrillation on Xarelto , and CKD stage IIIb.   OT comments  Pt making good progress with functional goals. Pt in bathroom upon OT arrival. Pt completed STS form commode and transfers to/from shower with Sup using grab bars for safety. Pt stood/sat at sink for UB/LB bathing an dressing tasks sup - max A, grooming/hygiene and oral care with Sup. Pt's HR 102 max during ADL activity. OT will continue to follow acutely to maximize level of function and safety      If plan is discharge home, recommend the following:  A little help with bathing/dressing/bathroom;Assistance with cooking/housework;Assist for transportation;Help with stairs or ramp for entrance   Equipment Recommendations  None recommended by OT    Recommendations for Other Services      Precautions / Restrictions Precautions Precautions: Fall Recall of Precautions/Restrictions: Impaired Precaution/Restrictions Comments: monitor HR,  RUE restricted/no BP on RUE Restrictions Weight Bearing Restrictions Per Provider Order: No       Mobility Bed Mobility               General bed mobility comments: pt in bathroom upon OT arrival    Transfers Overall transfer level: Needs assistance Equipment used: Rolling walker (2 wheels) Transfers: Sit to/from Stand, Bed to chair/wheelchair/BSC Sit to Stand: Supervision     Step pivot transfers: Supervision     General transfer comment: pt able to STS from commode and in and out of shower with Sup, cues to  use grab bar for safety     Balance Overall balance assessment: Needs assistance Sitting-balance support: No upper extremity supported, Feet supported Sitting balance-Leahy Scale: Good     Standing balance support: Bilateral upper extremity supported, During functional activity, Reliant on assistive device for balance, Single extremity supported Standing balance-Leahy Scale: Poor                             ADL either performed or assessed with clinical judgement   ADL Overall ADL's : Needs assistance/impaired     Grooming: Wash/dry hands;Wash/dry face;Oral care;Supervision/safety;Set up;Standing   Upper Body Bathing: Supervision/ safety;Standing   Lower Body Bathing: Contact guard assist;Sit to/from stand;Maximal assistance Lower Body Bathing Details (indicate cue type and reason): max A with feet Upper Body Dressing : Supervision/safety;Standing   Lower Body Dressing: Maximal assistance Lower Body Dressing Details (indicate cue type and reason): doffing/donning socks Toilet Transfer: Supervision/safety;Cueing for safety;Ambulation;Rolling walker (2 wheels)   Toileting- Clothing Manipulation and Hygiene: Contact guard assist;Supervision/safety;Sit to/from stand   Tub/ Shower Transfer: Supervision/safety;Ambulation;Rolling walker (2 wheels);Grab bars   Functional mobility during ADLs: Supervision/safety;Rolling walker (2 wheels);Cueing for safety General ADL Comments: pt stood at sink for bathing and dressing tasks    Extremity/Trunk Assessment Upper Extremity Assessment Upper Extremity Assessment: Generalized weakness   Lower Extremity Assessment Lower Extremity Assessment: Defer to PT evaluation   Cervical / Trunk Assessment Cervical / Trunk Assessment: Other exceptions Cervical / Trunk Exceptions: large body habitus    Vision Baseline Vision/History: 1 Wears glasses Ability to See in Adequate Light: 0 Adequate Patient Visual Report: No change from  baseline     Perception     Praxis     Communication Communication Communication: No apparent difficulties   Cognition Arousal: Alert Behavior During Therapy: WFL for tasks assessed/performed Cognition: No apparent impairments                               Following commands: Intact        Cueing   Cueing Techniques: Verbal cues  Exercises      Shoulder Instructions       General Comments      Pertinent Vitals/ Pain       Pain Assessment Pain Assessment: No/denies pain Pain Score: 0-No pain Pain Intervention(s): Monitored during session, Repositioned  Home Living                                          Prior Functioning/Environment              Frequency  Min 2X/week        Progress Toward Goals  OT Goals(current goals can now be found in the care plan section)  Progress towards OT goals: Progressing toward goals     Plan      Co-evaluation                 AM-PAC OT 6 Clicks Daily Activity     Outcome Measure   Help from another person eating meals?: None Help from another person taking care of personal grooming?: A Little Help from another person toileting, which includes using toliet, bedpan, or urinal?: A Little Help from another person bathing (including washing, rinsing, drying)?: A Lot Help from another person to put on and taking off regular upper body clothing?: A Little Help from another person to put on and taking off regular lower body clothing?: A Lot 6 Click Score: 17    End of Session Equipment Utilized During Treatment: Gait belt;Rolling walker (2 wheels)  OT Visit Diagnosis: Unsteadiness on feet (R26.81);Muscle weakness (generalized) (M62.81);Other abnormalities of gait and mobility (R26.89)   Activity Tolerance Patient tolerated treatment well   Patient Left in chair;with call bell/phone within reach   Nurse Communication Mobility status        Time: 8897-8863 OT Time  Calculation (min): 34 min  Charges: OT General Charges $OT Visit: 1 Visit OT Treatments $Self Care/Home Management : 8-22 mins $Therapeutic Activity: 8-22 mins    Jacques Karna Loose 04/07/2024, 1:15 PM

## 2024-04-07 NOTE — Progress Notes (Signed)
  Pt is pleasant,alert and fully oriented x 4, afebrile, stable hemodynamically, Atrial fibrillation on the monitor. HR is under controlled. She has a complaint about left knee pain which is well tolerated after Tylenol  given. No acute distress overnight. Pt is able to sleep and rest well. Plan of care is reviewed. Pt has been progressing.   Per RN day shift and Pt reported, on day shift she walked in hallway and she felt lightheaded. At that time her HR was 140s with non sustained Afib RVR for a short time. Pt reported she walked with four wheels walker, well tolerated. She denied chest pain, SOB or feeling of palpitations at that time. Pt has been getting Metoprolol  succinate 50 mg BID. MD aware. We will continue to monitor.  Wendi Dash, RN

## 2024-04-07 NOTE — Progress Notes (Signed)
 Mobility Specialist Progress Note:    04/07/24 1031  Mobility  Activity Ambulated with assistance  Level of Assistance Standby assist, set-up cues, supervision of patient - no hands on  Assistive Device Front wheel walker  Distance Ambulated (ft) 300 ft  Activity Response Tolerated well  Mobility Referral Yes  Mobility visit 1 Mobility  Mobility Specialist Start Time (ACUTE ONLY) 1030  Mobility Specialist Stop Time (ACUTE ONLY) 1045  Mobility Specialist Time Calculation (min) (ACUTE ONLY) 15 min   Pt received in bed, eager for mobility session. Ambulated in hallway with RW and chair follow for safety. Tolerated well, no c/o. Max HR 145 bpm. Took 4 standing rest breaks to reduce HR. Returned to room, left in care of OT.    [name] Mobility Specialist Please contact via Special Educational Needs Teacher or  Rehab office at [phone]

## 2024-04-25 ENCOUNTER — Telehealth (HOSPITAL_COMMUNITY): Payer: Self-pay | Admitting: Cardiology

## 2024-04-25 NOTE — Telephone Encounter (Signed)
 Called to confirm/remind patient of their appointment at the [facility] on 04/25/2024.   Appointment:   [x] Confirmed  [] Left mess   [] No answer/No voice mail  [] VM Full/unable to leave message  [] Phone not in service  Patient reminded to bring all medications and/or complete list.  Confirmed patient has transportation. Gave directions, instructed to utilize valet parking.

## 2024-04-26 ENCOUNTER — Ambulatory Visit (HOSPITAL_COMMUNITY): Payer: Self-pay | Admitting: Cardiology

## 2024-04-26 ENCOUNTER — Ambulatory Visit (HOSPITAL_COMMUNITY)
Admission: RE | Admit: 2024-04-26 | Discharge: 2024-04-26 | Disposition: A | Source: Ambulatory Visit | Attending: Cardiology | Admitting: Cardiology

## 2024-04-26 ENCOUNTER — Encounter (HOSPITAL_COMMUNITY): Payer: Self-pay | Admitting: Cardiology

## 2024-04-26 VITALS — BP 98/62 | HR 81 | Ht 62.0 in | Wt 261.0 lb

## 2024-04-26 DIAGNOSIS — I130 Hypertensive heart and chronic kidney disease with heart failure and stage 1 through stage 4 chronic kidney disease, or unspecified chronic kidney disease: Secondary | ICD-10-CM | POA: Insufficient documentation

## 2024-04-26 DIAGNOSIS — E66813 Obesity, class 3: Secondary | ICD-10-CM

## 2024-04-26 DIAGNOSIS — I5032 Chronic diastolic (congestive) heart failure: Secondary | ICD-10-CM | POA: Diagnosis present

## 2024-04-26 DIAGNOSIS — J45909 Unspecified asthma, uncomplicated: Secondary | ICD-10-CM | POA: Insufficient documentation

## 2024-04-26 DIAGNOSIS — M1712 Unilateral primary osteoarthritis, left knee: Secondary | ICD-10-CM | POA: Insufficient documentation

## 2024-04-26 DIAGNOSIS — E785 Hyperlipidemia, unspecified: Secondary | ICD-10-CM | POA: Insufficient documentation

## 2024-04-26 DIAGNOSIS — Z7901 Long term (current) use of anticoagulants: Secondary | ICD-10-CM | POA: Insufficient documentation

## 2024-04-26 DIAGNOSIS — Z9010 Acquired absence of unspecified breast and nipple: Secondary | ICD-10-CM | POA: Insufficient documentation

## 2024-04-26 DIAGNOSIS — I4821 Permanent atrial fibrillation: Secondary | ICD-10-CM | POA: Insufficient documentation

## 2024-04-26 DIAGNOSIS — Z79899 Other long term (current) drug therapy: Secondary | ICD-10-CM | POA: Insufficient documentation

## 2024-04-26 DIAGNOSIS — I482 Chronic atrial fibrillation, unspecified: Secondary | ICD-10-CM

## 2024-04-26 DIAGNOSIS — G629 Polyneuropathy, unspecified: Secondary | ICD-10-CM | POA: Insufficient documentation

## 2024-04-26 DIAGNOSIS — Z923 Personal history of irradiation: Secondary | ICD-10-CM | POA: Insufficient documentation

## 2024-04-26 DIAGNOSIS — I081 Rheumatic disorders of both mitral and tricuspid valves: Secondary | ICD-10-CM | POA: Insufficient documentation

## 2024-04-26 DIAGNOSIS — I510 Cardiac septal defect, acquired: Secondary | ICD-10-CM | POA: Insufficient documentation

## 2024-04-26 DIAGNOSIS — I272 Pulmonary hypertension, unspecified: Secondary | ICD-10-CM | POA: Insufficient documentation

## 2024-04-26 DIAGNOSIS — G4733 Obstructive sleep apnea (adult) (pediatric): Secondary | ICD-10-CM | POA: Insufficient documentation

## 2024-04-26 DIAGNOSIS — Z853 Personal history of malignant neoplasm of breast: Secondary | ICD-10-CM | POA: Insufficient documentation

## 2024-04-26 DIAGNOSIS — Z9221 Personal history of antineoplastic chemotherapy: Secondary | ICD-10-CM | POA: Insufficient documentation

## 2024-04-26 DIAGNOSIS — N1832 Chronic kidney disease, stage 3b: Secondary | ICD-10-CM | POA: Insufficient documentation

## 2024-04-26 DIAGNOSIS — I50810 Right heart failure, unspecified: Secondary | ICD-10-CM | POA: Insufficient documentation

## 2024-04-26 LAB — BASIC METABOLIC PANEL WITH GFR
Anion gap: 10 (ref 5–15)
BUN: 19 mg/dL (ref 8–23)
CO2: 32 mmol/L (ref 22–32)
Calcium: 9 mg/dL (ref 8.9–10.3)
Chloride: 100 mmol/L (ref 98–111)
Creatinine, Ser: 1.31 mg/dL — ABNORMAL HIGH (ref 0.44–1.00)
GFR, Estimated: 42 mL/min — ABNORMAL LOW (ref >60)
Glucose, Bld: 102 mg/dL — ABNORMAL HIGH (ref 70–99)
Potassium: 2.8 mmol/L — ABNORMAL LOW (ref 3.5–5.1)
Sodium: 142 mmol/L (ref 135–145)

## 2024-04-26 LAB — BRAIN NATRIURETIC PEPTIDE: B Natriuretic Peptide: 207.6 pg/mL — ABNORMAL HIGH (ref 0.0–100.0)

## 2024-04-26 MED ORDER — RIVAROXABAN 20 MG PO TABS: 20.0000 mg | ORAL_TABLET | Freq: Every day | ORAL | 3 refills | Status: AC

## 2024-04-26 MED ORDER — POTASSIUM CHLORIDE CRYS ER 20 MEQ PO TBCR: 40.0000 meq | EXTENDED_RELEASE_TABLET | Freq: Every day | ORAL | 3 refills | Status: DC

## 2024-04-26 MED ORDER — TORSEMIDE 20 MG PO TABS: ORAL_TABLET | ORAL | 3 refills | Status: DC

## 2024-04-26 NOTE — Progress Notes (Signed)
 [facility] Note  PCP: Linnie Thresa LABOR, MD  HF Cardiology: Dr Rolan  Chief complaint: CHF  HPI: 73 y.o. female w/ chronic diastolic heart failure, RV dysfunction, pulmonary HTN, persistent atrial fibrillation, MR/TR, HTN, HLD, h/o rt sided breast cancer in 2013 tx w/ mastectomy, chemo + radiation, CKD stage IIIb and peripheral neuropathy,  w/ recent admit for a/c diastolic heart failure. Evaluated by cardiology and diuresed w/ IV Lasix . Was noted to be in rate controlled afib, persistent. Echo in 3/23 showed hyperdynamic LVEF, 65-70%, D shaped LV c/w RV volume overload, Grade II DD w/ mod LVH, mildly reduced RV and severely elevated RVSP, 58 mmHg, moderate MR w/ severe BAE and severe TR.   After diuresis, was switched back to oral lasix  + Jardiance . Continued on Xarelto  and metoprolol  for afib. Referred to [facility] clinic. D/c Wt 279 lb. Reports full compliance w/ CPAP. Fully compliant w/ Xarelto . Denies h/o PE/DVT. No family h/o HF. Denies CP. Non smoker. + h/o asthma but no h/o COPD. No known h/o connective tissue disorder.    RHC was arranged from [facility] clinic, done in 4/23.  This showed markedly elevated right and left heart filling pressures with prominent v-waves in the PCWP tracing.  PVR not elevated, pulmonary venous hypertension.  Lasix  was increased.   She was seen post cath 4/23 and remained fluid overloaded. Lasix  was stopped. Switched to torsemide  60 mg bid. Amyloid work up arranged. Multiple Myeloma negative for M spike protein. Immunofixation unremarkable. She was also set up for TEE to better assess her mitral valve. This showed moderate MR w/ restriction of the posterior leaflet. ERO  0.13 cm^2 by PISA, vena contract area 0.21 cm^2. Also w/ mod-severe TR, normal LVEF 60-65% + mildly D-shaped interventricular septum suggestive of  RV pressure/volume overload.  Follow up 5/23, mild volume overload with NYHA II symptoms. Spiro 12.5 started.   PYP 5/23 showed visual  and quantitative assessments (grade 2, H/CL= 1.42), concerning for cardiac amyloidosis. Cardiac MRI in 7/23, however, was not suggestive of cardiac amyloidosis: LV EF 59%, no LGE, ECV 30%, moderate RV enlargement with RVEF 29%, biatrial enlargement, mild MR. [employer] gene testing was negative for hATTR.   Repeat PYP (10/23) equivocal for cardiac amyloidosis (grade 2, H/CL 1-1.5).   Follow up 6/24, volume overloaded and torsemide  increased to 60 bid. Arranged for repeat echo, and referred to General Surgery for abdominal pad pad biopsy to stain for amyloid.  Echo 7/24 showed EF 65-70%, mild concentric LVH, RV mildly reduced, LA/RA severely dilated, moderate MR/AI, TR severe.   Abd fat pad biopsy 11/24 showed Congo red stain negative for amyloid.   Echo in 6/25 showed EF 70-75%, normal RV, PASP 31 mmHg, moderate MR, moderate AI, mild TR.    Today she returns for HF follow up. She is up about 21 lbs since last appointment. She denies dyspnea walking around the house.  No orthopnea.  She gets short of breath with heavy exertion.  No chest pain.  No lightheadedness or palpitations.  She has left knee pain from OA that is more limiting for her than dyspnea.  She did not take her torsemide  regularly while out of town over Thanksgiving. She uses CPAP regularly.   Labs (4/23): Multiple Myeloma no M-spike protein, Immunofixation negative  Labs (6/23): K 4, creatinine 1.35, SPEP negative, urine immunofixation negative.  Labs (5/24): K 3.2, creatinine 1.39  Labs (7/24): K 4.3, creatinine 1.35 Labs (8/24): K 4.3, creatinine 1.27 Labs (11/24): K  4.5, creatinine 1.28, LDL 119 Labs (11/25): K 3.6, creatinine 1.39  ECG (personally reviewed): Atrial fibrillation with septal Qs  PMH: 1. CKD stage 3 2. OSA: uses CPAP.  3. Atrial fibrillation: Chronic.  It appears that she has been in AF since at least 2018.  4. Breast cancer on right in 2013: Taxotere /Cytoxan  chemotherapy.  5. Peripheral neuropathy.  6.  Chronic diastolic CHF with prominent RV failure: Echo in 3/23 with EF 65-70%, moderate LVH, D-shaped septum, mild RVE with mildly decreased systolic function, PASP 58, severe biatrial enlargement, at least moderate MR, severe TR, mild-moderate AI, IVC dilated.  - RHC (4/23): mean RA 22, PA 69/31 mean 46, mean PCWP 30 with v-waves to 44, CI 2.82, PVR 2.6 WU.  - TEE (4/23): moderate MR w/ restriction of the posterior leaflet. ERO  0.13 cm^2 by PISA, vena contract area 0.21 cm^2. Also w/ mod-severe TR, normal LVEF 60-65% + mildly D-shaped interventricular septum suggestive of  RV pressure/volume overload. - PYP (5/23) strongly suggestive of cardiac amyloidosis, grade 2, H/CL= 1.42 - Cardiac MRI (7/23): LV EF 59%, no LGE, ECV 30%, moderate RV enlargement with RVEF 29%, biatrial enlargement, mild MR. - PYP (10/23): Equivocal with grade 2, H/CL1-1.5 - Echo (7/24): EF 65-70%, mild concentric LVH, RV mildly reduced, LA/RA severely dilated, moderate MR/AI, TR severe - Abdominal fat pad biopsy 11/24 Congo red stain negative for AL amyloid - Echo (6/25): EF 70-75%, normal RV, PASP 31 mmHg, moderate MR, moderate AI, mild TR. 7. CKD stage 3  Review of Systems: All systems reviewed and negative except as per HPI.   Current Outpatient Medications  Medication Sig Dispense Refill   acetaminophen  (TYLENOL ) 325 MG tablet Take 650 mg by mouth daily as needed for mild pain (pain score 1-3) or moderate pain (pain score 4-6).     albuterol  (PROAIR  HFA) 108 (90 Base) MCG/ACT inhaler Inhale 1-2 puffs into the lungs every 6 (six) hours as needed for wheezing or shortness of breath. For shortness of breath 18 g 3   azaTHIOprine  (IMURAN ) 50 MG tablet Take 100 mg by mouth daily.     cabergoline  (DOSTINEX ) 0.5 MG tablet Take 0.5 mg by mouth 2 (two) times a week. Tuesday and Thursday     Cholecalciferol  (VITAMIN D3) 1000 UNITS CAPS Take 1,000 Units by mouth every other day.      dorzolamide  (TRUSOPT ) 2 % ophthalmic solution  Place 1 drop into both eyes 2 (two) times daily.     empagliflozin  (JARDIANCE ) 10 MG TABS tablet Take 1 tablet (10 mg total) by mouth daily. 90 tablet 3   GLUCOSAMINE HCL PO Take 15 mg by mouth daily.     latanoprost  (XALATAN ) 0.005 % ophthalmic solution Place 1 drop into both eyes at bedtime.     metoprolol  succinate (TOPROL -XL) 50 MG 24 hr tablet Take 1.5 tablets (75 mg total) by mouth 2 (two) times daily. 90 tablet 0   Multiple Vitamin (MULITIVITAMIN WITH MINERALS) TABS Take 1 tablet by mouth daily with breakfast.      potassium chloride  SA (KLOR-CON  M) 20 MEQ tablet Take 2 tablets (40 mEq total) by mouth daily. 60 tablet 3   prednisoLONE  acetate (PRED FORTE ) 1 % ophthalmic suspension Place 1 drop into the right eye daily.     simvastatin  (ZOCOR ) 20 MG tablet TAKE 1 TABLET AT BEDTIME 90 tablet 3   spironolactone  (ALDACTONE ) 25 MG tablet Take 1 tablet (25 mg total) by mouth daily. 90 tablet 3   vitamin B-12 (CYANOCOBALAMIN )  500 MCG tablet Take 500 mcg by mouth daily.     rivaroxaban  (XARELTO ) 20 MG TABS tablet Take 1 tablet (20 mg total) by mouth daily with supper. 90 tablet 3   torsemide  (DEMADEX ) 20 MG tablet Take 3 tablets (60 mg total) by mouth every morning AND 2 tablets (40 mg total) every evening. 150 tablet 3   No current facility-administered medications for this encounter.   Facility-Administered Medications Ordered in Other Encounters  Medication Dose Route Frequency Provider Last Rate Last Admin   lidocaine  (cardiac) 100 mg/5ml (XYLOCAINE ) 20 MG/ML injection 2%    Anesthesia Intra-op Jarvela, Joshua R, CRNA   60 mg at 06/02/17 1343   Allergies  Allergen Reactions   Trandolapril-Verapamil Hcl Er Swelling    Causes lips to swell [name] is the name brand) Tolerates amlodipine    Social History   Socioeconomic History   Marital status: Married    Spouse name: Not on file   Number of children: Not on file   Years of education: Not on file   Highest education level: Not on  file  Occupational History   Not on file  Tobacco Use   Smoking status: Never   Smokeless tobacco: Never  Vaping Use   Vaping status: Never Used  Substance and Sexual Activity   Alcohol  use: No   Drug use: No   Sexual activity: Yes    Birth control/protection: Post-menopausal, Surgical  Other Topics Concern   Not on file  Social History Narrative   Not on file   Social Drivers of Health   Financial Resource Strain: Low Risk  (12/09/2022)   Overall Financial Resource Strain (CARDIA)    Difficulty of Paying Living Expenses: Not hard at all  Food Insecurity: No Food Insecurity (03/30/2024)   Hunger Vital Sign    Worried About Running Out of Food in the Last Year: Never true    Ran Out of Food in the Last Year: Never true  Transportation Needs: No Transportation Needs (03/30/2024)   PRAPARE - Administrator, Civil Service (Medical): No    Lack of Transportation (Non-Medical): No  Physical Activity: Not on file  Stress: Not on file  Social Connections: Socially Integrated (03/30/2024)   Social Connection and Isolation Panel    Frequency of Communication with Friends and Family: More than three times a week    Frequency of Social Gatherings with Friends and Family: More than three times a week    Attends Religious Services: More than 4 times per year    Active Member of [employer] or Organizations: Yes    Attends Banker Meetings: 1 to 4 times per year    Marital Status: Married  Catering Manager Violence: Not At Risk (03/30/2024)   Humiliation, Afraid, Rape, and Kick questionnaire    Fear of Current or Ex-Partner: No    Emotionally Abused: No    Physically Abused: No    Sexually Abused: No   Family History  Problem Relation Age of Onset   Arthritis Mother    Cancer Father 28       prostate ca   Breast cancer Neg Hx    Wt Readings from Last 3 Encounters:  04/26/24 118.4 kg (261 lb)  04/07/24 116.1 kg (255 lb 15.3 oz)  03/28/24 112.5 kg (248 lb)   BP  98/62   Pulse 81   Ht 5' 2 (1.575 m)   Wt 118.4 kg (261 lb)   SpO2 98%   BMI 47.74  kg/m   PHYSICAL EXAM: General: NAD Neck: JVP 10-12 cm with HJR, no thyromegaly or thyroid  nodule.  Lungs: Clear to auscultation bilaterally with normal respiratory effort. CV: Nondisplaced PMI.  Heart irregular S1/S2, no S3/S4, no murmur.  1+ edema to knees.  No carotid bruit.  Normal pedal pulses.  Abdomen: Soft, nontender, no hepatosplenomegaly, no distention.  Skin: Intact without lesions or rashes.  Neurologic: Alert and oriented x 3.  Psych: Normal affect. Extremities: No clubbing or cyanosis.  HEENT: Normal.   ASSESSMENT & PLAN: 1. Chronic diastolic CHF with prominent RV dysfunction: Echo in 3/23 with EF 65-70%, moderate LVH, D-shaped septum, mild RVE with mildly decreased systolic function, PASP 58, severe biatrial enlargement, at least moderate MR, severe TR, mild-moderate AI, IVC dilated. RHC in 4/23 showed markedly elevated right and left heart filling pressures with prominent v-waves in the PCWP tracing.  PVR not elevated, pulmonary venous hypertension.  LV and RV failure components with valvular heart disease.  Chronic atrial fibrillation may also be a major driver for CHF. Multiple Myeloma study and immunofixation both normal. PYP 5/23 suggestive but not diagnostic of cardiac amyloidosis. However, cardiac MRI was NOT suggestive of cardiac amyloidosis with no LGE and no significant ECV percentage elevation.  [employer] gene testing was negative for hATTR.  Repeat PYP scan in 10/23 was equivocal.  Echo 12/09/22 EF 65-70%, mild concentric LVH, RV mildly reduced, LA/RA severely dilated, moderate MR/AI, TR severe. Abdominal fat pad bx 11/24 was negative for amyloid.  Overall, I think that cardiac amyloidosis is unlikely with the nonsuggestive cardiac MRI.  Echo in 6/25 showed EF 70-75%, normal RV, PASP 31 mmHg, moderate MR, moderate AI, mild TR. NYHA class II, more limited by left knee OA.  She is, however,  volume overloaded on exam and weight is up.  - Increase torsemide  to 60 mg bid x 4 days then 60 qam/40 qpm.  Increase KCl to 40 daily. MBET/BNP today, BMET in 10 days.  - Continue spironolactone  25 mg daily.  - Continue empagliflozin  10 mg daily.  - Continue Toprol  XL 75 mg bid.  - No Entresto (h/o angioedema w/ ACEi).  2. Pulmonary hypertension: This appears to be pulmonary venous hypertension.  Treatment will be diuresis.  - Increase torsemide  as above. 3. Permanent atrial fibrillation: Severe biatrial enlargement, AF present for several years. She is well rate-controlled. Unlikely to successfully cardiovert even with anti-arrhythmic given severe LAE and chronicity.  - Continue Xarelto .  4. OSA: Continue CPAP.  5. Mitral Regurgitation: Possible atrial functional MR with severe LAE. TEE 4/23 showed moderate MR w/ restriction of the posterior leaflet. ERO  0.13 cm^2 by PISA, vena contract area 0.21 cm^2. Echo 7/24 with moderate MR.  Echo in 6/25 with moderate MR.  6. Tricuspid Regurgitation: Mod-severe on TEE 4/23.  Echo 7/24 with severe TR. However, TR only mild on 6/25 echo.  7. CKD: Stage 3. Baseline SCr 1.3 range. - Continue empagliflozin . BMET today  Follow up in 3 wks with APP.   I spent 31 minutes reviewing records, interviewing/examining patient, and managing orders.   Ezra Shuck,  04/26/2024

## 2024-04-26 NOTE — Patient Instructions (Signed)
 Medication Changes:  INCREASE Torsemide  to 60 mg (3 tabs) Twice daily FOR 4 DAYS ONLY, then take 60 mg (3 tabs) in AM and 40 mg (2 tabs) in PM  START Potassium 40 meq (2 tabs) Daily  Lab Work:  Labs done today, your results will be available in MyChart, we will contact you for abnormal readings.  Your physician recommends that you return for lab work in: 1-2 weeks  Referrals:  You have been referred to Pharmacy Clinic for weight loss medication, they will contact you  Special Instructions // Education:  Do the following things EVERYDAY: Weigh yourself in the morning before breakfast. Write it down and keep it in a log. Take your medicines as prescribed Eat low salt foods--Limit salt (sodium) to 2000 mg per day.  Stay as active as you can everyday Limit all fluids for the day to less than 2 liters   Follow-Up in: 3-4 weeks  At the [facility], you and your health needs are our priority. We have a designated team specialized in the treatment of Heart Failure. This Care Team includes your primary Heart Failure Specialized Cardiologist (physician), Advanced Practice Providers (APPs- Physician Assistants and Nurse Practitioners), and Pharmacist who all work together to provide you with the care you need, when you need it.   You may see any of the following providers on your designated Care Team at your next follow up:  Dr. Toribio Fuel Dr. Ezra Shuck Dr. Odis Brownie Greig Mosses, NP Caffie Shed, [place] [facility], [place] Beckey Coe, NP Jordan Lee, NP Tinnie Redman, PharmD   Please be sure to bring in all your medications bottles to every appointment.   Need to Contact Us :  If you have any questions or concerns before your next appointment please send us  a message through [place] or call our office at [phone].    TO LEAVE A MESSAGE FOR THE NURSE SELECT OPTION 2, PLEASE LEAVE A MESSAGE INCLUDING: YOUR NAME DATE OF BIRTH CALL  BACK NUMBER REASON FOR CALL**this is important as we prioritize the call backs  YOU WILL RECEIVE A CALL BACK THE SAME DAY AS LONG AS YOU CALL BEFORE 4:00 PM

## 2024-04-27 MED ORDER — POTASSIUM CHLORIDE CRYS ER 20 MEQ PO TBCR: 40.0000 meq | EXTENDED_RELEASE_TABLET | Freq: Two times a day (BID) | ORAL | Status: AC

## 2024-05-03 ENCOUNTER — Ambulatory Visit (HOSPITAL_COMMUNITY)

## 2024-05-04 ENCOUNTER — Ambulatory Visit (HOSPITAL_COMMUNITY): Admission: RE | Admit: 2024-05-04 | Discharge: 2024-05-04 | Attending: Cardiology

## 2024-05-04 DIAGNOSIS — I5032 Chronic diastolic (congestive) heart failure: Secondary | ICD-10-CM | POA: Insufficient documentation

## 2024-05-04 LAB — BASIC METABOLIC PANEL WITH GFR
Anion gap: 14 (ref 5–15)
BUN: 18 mg/dL (ref 8–23)
CO2: 26 mmol/L (ref 22–32)
Calcium: 9.6 mg/dL (ref 8.9–10.3)
Chloride: 102 mmol/L (ref 98–111)
Creatinine, Ser: 1.25 mg/dL — ABNORMAL HIGH (ref 0.44–1.00)
GFR, Estimated: 45 mL/min — ABNORMAL LOW (ref >60)
Glucose, Bld: 97 mg/dL (ref 70–99)
Potassium: 4.9 mmol/L (ref 3.5–5.1)
Sodium: 142 mmol/L (ref 135–145)

## 2024-05-17 ENCOUNTER — Ambulatory Visit (HOSPITAL_COMMUNITY)
Admission: RE | Admit: 2024-05-17 | Discharge: 2024-05-17 | Disposition: A | Discharge status: Home / Self Care | Source: Ambulatory Visit | Attending: Cardiology | Admitting: Cardiology

## 2024-05-17 ENCOUNTER — Ambulatory Visit (HOSPITAL_COMMUNITY): Payer: Self-pay | Admitting: Cardiology

## 2024-05-17 ENCOUNTER — Encounter (HOSPITAL_COMMUNITY): Payer: Self-pay

## 2024-05-17 VITALS — BP 92/52 | HR 69 | Ht 62.0 in | Wt 259.8 lb

## 2024-05-17 DIAGNOSIS — I081 Rheumatic disorders of both mitral and tricuspid valves: Secondary | ICD-10-CM | POA: Insufficient documentation

## 2024-05-17 DIAGNOSIS — Z6841 Body Mass Index (BMI) 40.0 and over, adult: Secondary | ICD-10-CM | POA: Insufficient documentation

## 2024-05-17 DIAGNOSIS — G4733 Obstructive sleep apnea (adult) (pediatric): Secondary | ICD-10-CM | POA: Diagnosis not present

## 2024-05-17 DIAGNOSIS — Z7901 Long term (current) use of anticoagulants: Secondary | ICD-10-CM | POA: Diagnosis not present

## 2024-05-17 DIAGNOSIS — Z79899 Other long term (current) drug therapy: Secondary | ICD-10-CM | POA: Insufficient documentation

## 2024-05-17 DIAGNOSIS — Z79624 Long term (current) use of inhibitors of nucleotide synthesis: Secondary | ICD-10-CM | POA: Diagnosis not present

## 2024-05-17 DIAGNOSIS — Z79631 Long term (current) use of antimetabolite agent: Secondary | ICD-10-CM | POA: Insufficient documentation

## 2024-05-17 DIAGNOSIS — J45909 Unspecified asthma, uncomplicated: Secondary | ICD-10-CM | POA: Diagnosis not present

## 2024-05-17 DIAGNOSIS — I5032 Chronic diastolic (congestive) heart failure: Secondary | ICD-10-CM | POA: Diagnosis not present

## 2024-05-17 DIAGNOSIS — Z7984 Long term (current) use of oral hypoglycemic drugs: Secondary | ICD-10-CM | POA: Diagnosis not present

## 2024-05-17 DIAGNOSIS — I130 Hypertensive heart and chronic kidney disease with heart failure and stage 1 through stage 4 chronic kidney disease, or unspecified chronic kidney disease: Secondary | ICD-10-CM | POA: Insufficient documentation

## 2024-05-17 DIAGNOSIS — E785 Hyperlipidemia, unspecified: Secondary | ICD-10-CM | POA: Diagnosis not present

## 2024-05-17 DIAGNOSIS — I4821 Permanent atrial fibrillation: Secondary | ICD-10-CM | POA: Diagnosis not present

## 2024-05-17 DIAGNOSIS — N1832 Chronic kidney disease, stage 3b: Secondary | ICD-10-CM | POA: Diagnosis not present

## 2024-05-17 DIAGNOSIS — M1712 Unilateral primary osteoarthritis, left knee: Secondary | ICD-10-CM | POA: Insufficient documentation

## 2024-05-17 DIAGNOSIS — E669 Obesity, unspecified: Secondary | ICD-10-CM | POA: Insufficient documentation

## 2024-05-17 LAB — BASIC METABOLIC PANEL WITH GFR
Anion gap: 12 (ref 5–15)
BUN: 25 mg/dL — ABNORMAL HIGH (ref 8–23)
CO2: 28 mmol/L (ref 22–32)
Calcium: 9.2 mg/dL (ref 8.9–10.3)
Chloride: 100 mmol/L (ref 98–111)
Creatinine, Ser: 1.56 mg/dL — ABNORMAL HIGH (ref 0.44–1.00)
GFR, Estimated: 34 mL/min — ABNORMAL LOW
Glucose, Bld: 93 mg/dL (ref 70–99)
Potassium: 3.9 mmol/L (ref 3.5–5.1)
Sodium: 139 mmol/L (ref 135–145)

## 2024-05-17 LAB — PRO BRAIN NATRIURETIC PEPTIDE: Pro Brain Natriuretic Peptide: 443 pg/mL — ABNORMAL HIGH

## 2024-05-17 MED ORDER — TORSEMIDE 20 MG PO TABS: ORAL_TABLET | ORAL | Status: AC

## 2024-05-17 MED ORDER — TORSEMIDE 20 MG PO TABS: 60.0000 mg | ORAL_TABLET | Freq: Two times a day (BID) | ORAL | 3 refills | Status: DC

## 2024-05-17 NOTE — Patient Instructions (Signed)
 CHANGE Torsemide  to 60 mg Twice daily  Labs done today, your results will be available in MyChart, we will contact you for abnormal readings.  Your physician recommends that you schedule a follow-up appointment in: 2 months.  If you have any questions or concerns before your next appointment please send us  a message through [place] or call our office at [phone].    TO LEAVE A MESSAGE FOR THE NURSE SELECT OPTION 2, PLEASE LEAVE A MESSAGE INCLUDING: YOUR NAME DATE OF BIRTH CALL BACK NUMBER REASON FOR CALL**this is important as we prioritize the call backs  YOU WILL RECEIVE A CALL BACK THE SAME DAY AS LONG AS YOU CALL BEFORE 4:00 PM  At the [facility], you and your health needs are our priority. As part of our continuing mission to provide you with exceptional heart care, we have created designated Provider Care Teams. These Care Teams include your primary Cardiologist (physician) and Advanced Practice Providers (APPs- Physician Assistants and Nurse Practitioners) who all work together to provide you with the care you need, when you need it.   You may see any of the following providers on your designated Care Team at your next follow up: Dr Toribio Fuel Dr Ezra Shuck Dr. Morene Brownie Greig Mosses, NP Caffie Shed, [place] [facility], [place] Beckey Coe, NP Jordan Lee, NP Ellouise Class, NP Tinnie Redman, PharmD Jaun Bash, PharmD   Please be sure to bring in all your medications bottles to every appointment.    Thank you for choosing [facility]

## 2024-05-17 NOTE — Telephone Encounter (Addendum)
 Pt aware, agreeable, and verbalized understanding  Med list updated   ----- Message from Caffie Shed, PA-C sent at 05/17/2024 12:42 PM EST ----- Based on today's labs, her fluid marker is not that high. Keep torsemide  at 60 qpm/ 40 qpm

## 2024-05-17 NOTE — Progress Notes (Signed)
 "  [facility] Note  PCP: Linnie Thresa LABOR, MD  HF Cardiology: Dr Rolan  Chief complaint: CHF  HPI: 73 y.o. female w/ chronic diastolic heart failure, RV dysfunction, pulmonary HTN, persistent atrial fibrillation, MR/TR, HTN, HLD, h/o rt sided breast cancer in 2013 tx w/ mastectomy, chemo + radiation, CKD stage IIIb and peripheral neuropathy,  w/ recent admit for a/c diastolic heart failure. Evaluated by cardiology and diuresed w/ IV Lasix . Was noted to be in rate controlled afib, persistent. Echo in 3/23 showed hyperdynamic LVEF, 65-70%, D shaped LV c/w RV volume overload, Grade II DD w/ mod LVH, mildly reduced RV and severely elevated RVSP, 58 mmHg, moderate MR w/ severe BAE and severe TR.   After diuresis, was switched back to oral lasix  + Jardiance . Continued on Xarelto  and metoprolol  for afib. Referred to [facility] clinic. D/c Wt 279 lb. Reports full compliance w/ CPAP. Fully compliant w/ Xarelto . Denies h/o PE/DVT. No family h/o HF. Denies CP. Non smoker. + h/o asthma but no h/o COPD. No known h/o connective tissue disorder.    RHC was arranged from [facility] clinic, done in 4/23.  This showed markedly elevated right and left heart filling pressures with prominent v-waves in the PCWP tracing.  PVR not elevated, pulmonary venous hypertension.  Lasix  was increased.   She was seen post cath 4/23 and remained fluid overloaded. Lasix  was stopped. Switched to torsemide  60 mg bid. Amyloid work up arranged. Multiple Myeloma negative for M spike protein. Immunofixation unremarkable. She was also set up for TEE to better assess her mitral valve. This showed moderate MR w/ restriction of the posterior leaflet. ERO  0.13 cm^2 by PISA, vena contract area 0.21 cm^2. Also w/ mod-severe TR, normal LVEF 60-65% + mildly D-shaped interventricular septum suggestive of  RV pressure/volume overload.  Follow up 5/23, mild volume overload with NYHA II symptoms. Spiro 12.5 started.   PYP 5/23 showed visual  and quantitative assessments (grade 2, H/CL= 1.42), concerning for cardiac amyloidosis. Cardiac MRI in 7/23, however, was not suggestive of cardiac amyloidosis: LV EF 59%, no LGE, ECV 30%, moderate RV enlargement with RVEF 29%, biatrial enlargement, mild MR. [employer] gene testing was negative for hATTR.   Repeat PYP (10/23) equivocal for cardiac amyloidosis (grade 2, H/CL 1-1.5).   Follow up 6/24, volume overloaded and torsemide  increased to 60 bid. Arranged for repeat echo, and referred to General Surgery for abdominal pad pad biopsy to stain for amyloid.  Echo 7/24 showed EF 65-70%, mild concentric LVH, RV mildly reduced, LA/RA severely dilated, moderate MR/AI, TR severe.   Abd fat pad biopsy 11/24 showed Congo red stain negative for amyloid.   Echo in 6/25 showed EF 70-75%, normal RV, PASP 31 mmHg, moderate MR, moderate AI, mild TR.    Seen earlier this month and was noted to be volume overloaded. NYHA III. Admitted to dietary indiscretion + poor compliance w/ her torsemide  during the Thanksgiving holiday. Diuretics adjusted. She was instructed to increase torsemide  to 60 mg bid x 4 days then 60 qam/40 qpm.  She returns back today for f/u. She reports feeling better but wt only down 3 lb. Still w/ mild volume overload on exam. Denies resting dyspnea. Reports stable NYHA Class II symptoms confounded by obesity/deconditioning.   She reports compliance w/ diuretics and good UOP. She has been trying to restrict sodium and fluid intake. Compliant w/ CPAP.    Labs (4/23): Multiple Myeloma no M-spike protein, Immunofixation negative  Labs (6/23): K 4, creatinine  1.35, SPEP negative, urine immunofixation negative.  Labs (5/24): K 3.2, creatinine 1.39  Labs (7/24): K 4.3, creatinine 1.35 Labs (8/24): K 4.3, creatinine 1.27 Labs (11/24): K 4.5, creatinine 1.28, LDL 119 Labs (11/25): K 3.6, creatinine 1.39 Labs (12/25): K 4.9, creatinine 1.25   ECG: not performed   PMH: 1. CKD stage 3 2. OSA:  uses CPAP.  3. Atrial fibrillation: Chronic.  It appears that she has been in AF since at least 2018.  4. Breast cancer on right in 2013: Taxotere /Cytoxan  chemotherapy.  5. Peripheral neuropathy.  6. Chronic diastolic CHF with prominent RV failure: Echo in 3/23 with EF 65-70%, moderate LVH, D-shaped septum, mild RVE with mildly decreased systolic function, PASP 58, severe biatrial enlargement, at least moderate MR, severe TR, mild-moderate AI, IVC dilated.  - RHC (4/23): mean RA 22, PA 69/31 mean 46, mean PCWP 30 with v-waves to 44, CI 2.82, PVR 2.6 WU.  - TEE (4/23): moderate MR w/ restriction of the posterior leaflet. ERO  0.13 cm^2 by PISA, vena contract area 0.21 cm^2. Also w/ mod-severe TR, normal LVEF 60-65% + mildly D-shaped interventricular septum suggestive of  RV pressure/volume overload. - PYP (5/23) strongly suggestive of cardiac amyloidosis, grade 2, H/CL= 1.42 - Cardiac MRI (7/23): LV EF 59%, no LGE, ECV 30%, moderate RV enlargement with RVEF 29%, biatrial enlargement, mild MR. - PYP (10/23): Equivocal with grade 2, H/CL1-1.5 - Echo (7/24): EF 65-70%, mild concentric LVH, RV mildly reduced, LA/RA severely dilated, moderate MR/AI, TR severe - Abdominal fat pad biopsy 11/24 Congo red stain negative for AL amyloid - Echo (6/25): EF 70-75%, normal RV, PASP 31 mmHg, moderate MR, moderate AI, mild TR. 7. CKD stage 3  Review of Systems: All systems reviewed and negative except as per HPI.   Current Outpatient Medications  Medication Sig Dispense Refill   acetaminophen  (TYLENOL ) 325 MG tablet Take 650 mg by mouth daily as needed for mild pain (pain score 1-3) or moderate pain (pain score 4-6).     albuterol  (PROAIR  HFA) 108 (90 Base) MCG/ACT inhaler Inhale 1-2 puffs into the lungs every 6 (six) hours as needed for wheezing or shortness of breath. For shortness of breath 18 g 3   azaTHIOprine  (IMURAN ) 50 MG tablet Take 100 mg by mouth daily.     cabergoline  (DOSTINEX ) 0.5 MG tablet Take  0.5 mg by mouth 2 (two) times a week. Tuesday and Thursday     Cholecalciferol  (VITAMIN D3) 1000 UNITS CAPS Take 1,000 Units by mouth every other day.      dorzolamide  (TRUSOPT ) 2 % ophthalmic solution Place 1 drop into both eyes 2 (two) times daily.     empagliflozin  (JARDIANCE ) 10 MG TABS tablet Take 1 tablet (10 mg total) by mouth daily. 90 tablet 1   GLUCOSAMINE HCL PO Take 15 mg by mouth daily.     latanoprost  (XALATAN ) 0.005 % ophthalmic solution Place 1 drop into both eyes at bedtime.     metoprolol  succinate (TOPROL -XL) 50 MG 24 hr tablet Take 1.5 tablets (75 mg total) by mouth 2 (two) times daily. 90 tablet 0   Multiple Vitamin (MULITIVITAMIN WITH MINERALS) TABS Take 1 tablet by mouth daily with breakfast.      potassium chloride  SA (KLOR-CON  M) 20 MEQ tablet Take 2 tablets (40 mEq total) by mouth 2 (two) times daily.     prednisoLONE  acetate (PRED FORTE ) 1 % ophthalmic suspension Place 1 drop into the right eye daily.     rivaroxaban  (XARELTO ) 20  MG TABS tablet Take 1 tablet (20 mg total) by mouth daily with supper. 90 tablet 3   simvastatin  (ZOCOR ) 20 MG tablet TAKE 1 TABLET AT BEDTIME 90 tablet 3   spironolactone  (ALDACTONE ) 25 MG tablet Take 1 tablet (25 mg total) by mouth daily. 90 tablet 3   torsemide  (DEMADEX ) 20 MG tablet Take 3 tablets (60 mg total) by mouth every morning AND 2 tablets (40 mg total) every evening. 150 tablet 3   vitamin B-12 (CYANOCOBALAMIN ) 500 MCG tablet Take 500 mcg by mouth daily.     No current facility-administered medications for this encounter.   Facility-Administered Medications Ordered in Other Encounters  Medication Dose Route Frequency Provider Last Rate Last Admin   lidocaine  (cardiac) 100 mg/5ml (XYLOCAINE ) 20 MG/ML injection 2%    Anesthesia Intra-op Jarvela, Joshua R, CRNA   60 mg at 06/02/17 1343   Allergies  Allergen Reactions   Trandolapril-Verapamil Hcl Er Swelling    Causes lips to swell [name] is the name brand) Tolerates amlodipine     Social History   Socioeconomic History   Marital status: Married    Spouse name: Not on file   Number of children: Not on file   Years of education: Not on file   Highest education level: Not on file  Occupational History   Not on file  Tobacco Use   Smoking status: Never   Smokeless tobacco: Never  Vaping Use   Vaping status: Never Used  Substance and Sexual Activity   Alcohol  use: No   Drug use: No   Sexual activity: Yes    Birth control/protection: Post-menopausal, Surgical  Other Topics Concern   Not on file  Social History Narrative   Not on file   Social Drivers of Health   Tobacco Use: Low Risk (05/17/2024)   Patient History    Smoking Tobacco Use: Never    Smokeless Tobacco Use: Never    Passive Exposure: Not on file  Financial Resource Strain: Low Risk (12/09/2022)   Overall Financial Resource Strain (CARDIA)    Difficulty of Paying Living Expenses: Not hard at all  Food Insecurity: No Food Insecurity (03/30/2024)   Epic    Worried About Radiation Protection Practitioner of Food in the Last Year: Never true    Ran Out of Food in the Last Year: Never true  Transportation Needs: No Transportation Needs (03/30/2024)   Epic    Lack of Transportation (Medical): No    Lack of Transportation (Non-Medical): No  Physical Activity: Not on file  Stress: Not on file  Social Connections: Socially Integrated (03/30/2024)   Social Connection and Isolation Panel    Frequency of Communication with Friends and Family: More than three times a week    Frequency of Social Gatherings with Friends and Family: More than three times a week    Attends Religious Services: More than 4 times per year    Active Member of [employer] or Organizations: Yes    Attends Banker Meetings: 1 to 4 times per year    Marital Status: Married  Catering Manager Violence: Not At Risk (03/30/2024)   Epic    Fear of Current or Ex-Partner: No    Emotionally Abused: No    Physically Abused: No    Sexually Abused:  No  Depression (PHQ2-9): Low Risk (03/28/2024)   Depression (PHQ2-9)    PHQ-2 Score: 0  Alcohol  Screen: Not on file  Housing: Low Risk (03/30/2024)   Epic    Unable to Pay for  Housing in the Last Year: No    Number of Times Moved in the Last Year: 0    Homeless in the Last Year: No  Utilities: Not At Risk (03/30/2024)   Epic    Threatened with loss of utilities: No  Health Literacy: Adequate Health Literacy (12/09/2022)   B1300 Health Literacy    Frequency of need for help with medical instructions: Never   Family History  Problem Relation Age of Onset   Arthritis Mother    Cancer Father 23       prostate ca   Breast cancer Neg Hx    Wt Readings from Last 3 Encounters:  05/17/24 117.8 kg (259 lb 12.8 oz)  04/26/24 118.4 kg (261 lb)  04/07/24 116.1 kg (255 lb 15.3 oz)   BP (!) 92/52   Pulse 69   Ht 5' 2 (1.575 m)   Wt 117.8 kg (259 lb 12.8 oz)   SpO2 96%   BMI 47.52 kg/m   PHYSICAL EXAM: GENERAL: NAD Lungs- diminished at the bases  CARDIAC:  JVP 8-9 cm + HJR          Irregularly irregular regular rhythm and rate.  ABDOMEN: Soft, non-tender, non-distended.  EXTREMITIES: Warm and well perfused. 1+ b/l LEE  NEUROLOGIC: No obvious FND   ASSESSMENT & PLAN: 1. Chronic diastolic CHF with prominent RV dysfunction: Echo in 3/23 with EF 65-70%, moderate LVH, D-shaped septum, mild RVE with mildly decreased systolic function, PASP 58, severe biatrial enlargement, at least moderate MR, severe TR, mild-moderate AI, IVC dilated. RHC in 4/23 showed markedly elevated right and left heart filling pressures with prominent v-waves in the PCWP tracing.  PVR not elevated, pulmonary venous hypertension.  LV and RV failure components with valvular heart disease.  Chronic atrial fibrillation may also be a major driver for CHF. Multiple Myeloma study and immunofixation both normal. PYP 5/23 suggestive but not diagnostic of cardiac amyloidosis. However, cardiac MRI was NOT suggestive of cardiac  amyloidosis with no LGE and no significant ECV percentage elevation.  [facility]ae gene testing was negative for hATTR.  Repeat PYP scan in 10/23 was equivocal.  Echo 12/09/22 EF 65-70%, mild concentric LVH, RV mildly reduced, LA/RA severely dilated, moderate MR/AI, TR severe. Abdominal fat pad bx 11/24 was negative for amyloid.  Overall, think that cardiac amyloidosis is unlikely with the nonsuggestive cardiac MRI.  Echo in 6/25 showed EF 70-75%, normal RV, PASP 31 mmHg, moderate MR, moderate AI, mild TR. NYHA class II, more limited by left knee OA/obesity/deconditioning.  She remains mildly volume overloaded on exam  - Increase torsemide  to 60 mg bid. Check BMP/BNP today  - Continue spironolactone  25 mg daily.  - Continue empagliflozin  10 mg daily.  - Continue Toprol  XL 75 mg bid.  - No Entresto (h/o angioedema w/ ACEi).  2. Pulmonary hypertension: This appears to be pulmonary venous hypertension.  Treatment will be diuresis.  - Increase torsemide  as above. 3. Permanent atrial fibrillation: Severe biatrial enlargement, AF present for several years. She is well rate-controlled. Unlikely to successfully cardiovert even with anti-arrhythmic given severe LAE and chronicity.  - Continue Xarelto .  4. OSA: reports compliance w/ CPAP. Continue  5. Mitral Regurgitation: Possible atrial functional MR with severe LAE. TEE 4/23 showed moderate MR w/ restriction of the posterior leaflet. ERO  0.13 cm^2 by PISA, vena contract area 0.21 cm^2. Echo 7/24 with moderate MR.  Echo in 6/25 with moderate MR.  6. Tricuspid Regurgitation: Mod-severe on TEE 4/23.  Echo 7/24 with  severe TR. However, TR only mild on 6/25 echo.  7. CKD: Stage 3. Baseline SCr 1.3 range. - Continue empagliflozin . - Check BMP 8. Obesity: Body mass index is 47.52 kg/m. - has been referred for PharmD for GLP1   Follow up in 2 months w/ APP   Caffie Shed, PA-C  05/17/2024 "

## 2024-06-22 ENCOUNTER — Telehealth: Payer: Self-pay | Admitting: Pharmacy Technician

## 2024-06-22 ENCOUNTER — Other Ambulatory Visit (HOSPITAL_COMMUNITY): Payer: Self-pay

## 2024-06-22 ENCOUNTER — Telehealth: Payer: Self-pay | Admitting: Pharmacist

## 2024-06-22 NOTE — Telephone Encounter (Signed)
" ° °  Pharmacy Patient Advocate Encounter   Received notification from Pt Calls Messages that prior authorization for zepbound is required/requested.   Insurance verification completed.   The patient is insured through [employer].   Per test claim: PA required; PA submitted to above mentioned insurance via Latent Key/confirmation #/EOC [id number] Status is pending    Pharmacy Patient Advocate Encounter  Received notification from [facility] that Prior Authorization for zepbound has been DENIED.  See denial reason below. No denial letter attached in CMM. Will attach denial letter to Media tab once received.   PA #/Case ID/Reference #: Message from [facility]: Drug is not covered by plan     Currently there is no assistance for zepbound unless insurance covers some of the cost then they can get a coupon. The cheapest zepbound option is through [facility] for 299.00 for 2.5mg  vials, 399.00 for 5mg  vials, and 449.00 for 7.5mg  vials. [employer] [facility] does not have the syringes. Currently there is no assistance for wegovy unless insurance covers some of the cost then they can get a coupon. The cheapest option for wegovy syringes is through [facility] pharmacy for 199.00 for the first two months, then it goes to 349.00 per month. Currently there is no assistance for wegovy tablets but they can get the wegovy tablets through [facility] for 149.00 per month for the 1.5mg  dose until 09/07/24 then it will increase to 199.00. For the wegovy 9mg  and 25mg  tablets both are priced 299.00 per month through [facility]. In order to get the cash price, you will need to go to [facility]. "

## 2024-06-22 NOTE — Progress Notes (Unsigned)
 Patient ID: [name]                 DOB: 1950/10/14                    MRN: [id number]     HPI: [name] is a 74 y.o. female patient referred to pharmacy clinic by Dr.Mclean to initiate GLP1-RA therapy. PMH is significant for chronic diastolic heart failure, pulmonary HTN, persistent A-fib, HTN, HLD,  CKD stage IIIb, and obesity. Most recent BMI 47.4 kg/m .  Baseline weight and BMI: 259 lbs and 47.4 kg/m  Current weight and BMI: *** kg/m  Current meds that affect weight: Torsemide , Empaglaflozin  Most Recent BP: 92/52 Most recent A1c: 5.9  Diet:   Exercise:   Family History:  Relation Problem Comments  Mother Metallurgist) Arthritis     Father (Deceased) Cancer (Age: 66) prostate ca    Sister Metallurgist)   Sister Metallurgist)   Sister Metallurgist)   Brother Metallurgist)   Brother (Alive)   Maternal Grandmother (Deceased)   Maternal Grandfather (Deceased)   Paternal Grandmother (Deceased)   Paternal Grandfather (Deceased)   Neg Hx Breast cancer       Social History:   Labs: No results found for: HGBA1C  Wt Readings from Last 1 Encounters:  05/17/24 259 lb 12.8 oz (117.8 kg)    BP Readings from Last 1 Encounters:  05/17/24 (!) 92/52   Pulse Readings from Last 1 Encounters:  05/17/24 69       Component Value Date/Time   CHOL 182 10/15/2018 1047   TRIG 66 10/15/2018 1047   HDL 64 10/15/2018 1047   CHOLHDL 2.8 10/15/2018 1047   LDLCALC 105 (H) 10/15/2018 1047    Past Medical History:  Diagnosis Date   Arthritis    Asthma    Blood transfusion    hx of last one in 1987   Breast cancer (HCC) 2013   Right Breast Cancer   Cancer of lower-inner quadrant of female breast (HCC) 07/24/2011   right breast cancer /IDC,stage IIB,er/pr=+,her2=Neg   Glaucoma    Heart murmur    History of chemotherapy    taxotere /cytoxan  6 cycles 09/22/11-01/05/12 day 2 neulasta    Hyperlipemia    Hypertension    Lymphedema    right arm wears a sleeve   MRSA (methicillin resistant  staph aureus) culture positive    08/2011   Neuromuscular disorder (HCC)    peripheral neuropathy hands feet   OSA (obstructive sleep apnea)    CPAP settings- ?    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 Cabbell follows    Medications Ordered Prior to Encounter[1]  Allergies[2]   Assessment/Plan:  1. Weight loss -   No problems updated. No problem-specific Assessment & Plan notes found for this encounter.  Patient has not met goal of at least 5% of body weight loss with comprehensive lifestyle modifications alone in the past 3-6 months. Pharmacotherapy is appropriate to pursue as augmentation. Will start*** . Confirmed patient not ***pregnant and no personal or family history of medullary thyroid  carcinoma (MTC) or Multiple Endocrine Neoplasia syndrome type 2 (MEN 2). Injection technique reviewed at today's visit.  Advised patient on common side effects including nausea, diarrhea, dyspepsia, decreased appetite, and fatigue. Counseled patient on reducing meal size and how to titrate medication to minimize  side effects. Counseled patient to call if intolerable side effects or if experiencing dehydration, abdominal pain, or dizziness. Along with pharmacotherapy, the patient will follow dietary modifications and aim for at least 150 minutes of moderate-intensity exercise per week, plus resistance training twice a week (as recommended by the American [employer] Association). This resistance training--such as weightlifting, bodyweight exercises, or using resistance bands, adapted to the patients ability--will help prevent muscle loss.  Follow up in 1-2 days regarding coverage of *** . If therapy is initiated, phone follow-ups will be conducted every 4 weeks for dose titration until the patient reaches the effective therapeutic dose and target  weight.   Robbi Blanch, Pharm.D [name]. [facility] [address], [place], [place] [address] Phone: [phone]; Fax: [phone]      [1]  Current Outpatient Medications on File Prior to Visit  Medication Sig Dispense Refill   acetaminophen  (TYLENOL ) 325 MG tablet Take 650 mg by mouth daily as needed for mild pain (pain score 1-3) or moderate pain (pain score 4-6).     albuterol  (PROAIR  HFA) 108 (90 Base) MCG/ACT inhaler Inhale 1-2 puffs into the lungs every 6 (six) hours as needed for wheezing or shortness of breath. For shortness of breath 18 g 3   azaTHIOprine  (IMURAN ) 50 MG tablet Take 100 mg by mouth daily.     cabergoline  (DOSTINEX ) 0.5 MG tablet Take 0.5 mg by mouth 2 (two) times a week. Tuesday and Thursday     Cholecalciferol  (VITAMIN D3) 1000 UNITS CAPS Take 1,000 Units by mouth every other day.      dorzolamide  (TRUSOPT ) 2 % ophthalmic solution Place 1 drop into both eyes 2 (two) times daily.     empagliflozin  (JARDIANCE ) 10 MG TABS tablet Take 1 tablet (10 mg total) by mouth daily. 90 tablet 1   GLUCOSAMINE HCL PO Take 15 mg by mouth daily.     latanoprost  (XALATAN ) 0.005 % ophthalmic solution Place 1 drop into both eyes at bedtime.     metoprolol  succinate (TOPROL -XL) 50 MG 24 hr tablet Take 1.5 tablets (75 mg total) by mouth 2 (two) times daily. 90 tablet 0   Multiple Vitamin (MULITIVITAMIN WITH MINERALS) TABS Take 1 tablet by mouth daily with breakfast.      potassium chloride  SA (KLOR-CON  M) 20 MEQ tablet Take 2 tablets (40 mEq total) by mouth 2 (two) times daily.     prednisoLONE  acetate (PRED FORTE ) 1 % ophthalmic suspension Place 1 drop into the right eye daily.     rivaroxaban  (XARELTO ) 20 MG TABS tablet Take 1 tablet (20 mg total) by mouth daily with supper. 90 tablet 3   simvastatin  (ZOCOR ) 20 MG tablet TAKE 1 TABLET AT BEDTIME 90 tablet 3   spironolactone  (ALDACTONE ) 25 MG tablet Take 1 tablet (25 mg total) by  mouth daily. 90 tablet 3   torsemide  (DEMADEX ) 20 MG tablet Take 3 tablets (60 mg total) by mouth every morning AND 2 tablets (40 mg total) every evening.     vitamin B-12 (CYANOCOBALAMIN ) 500 MCG tablet Take 500 mcg by mouth daily.     Current Facility-Administered Medications on File Prior to Visit  Medication Dose Route Frequency Provider Last Rate Last Admin   lidocaine  (cardiac) 100 mg/71ml (XYLOCAINE ) 20 MG/ML injection 2%    Anesthesia Intra-op Jarvela, Joshua R, CRNA   60 mg at 06/02/17 1343  [2]  Allergies Allergen Reactions   Trandolapril-Verapamil Hcl Er Swelling  Causes lips to swell [name] is the name brand) Tolerates amlodipine 

## 2024-06-22 NOTE — Telephone Encounter (Signed)
 Weight loss GLP1 coverage assessment

## 2024-06-23 ENCOUNTER — Ambulatory Visit: Admitting: Pharmacist

## 2024-07-18 ENCOUNTER — Ambulatory Visit (HOSPITAL_COMMUNITY)

## 2024-08-01 ENCOUNTER — Ambulatory Visit: Admitting: Pharmacist

## 2025-03-28 ENCOUNTER — Inpatient Hospital Stay: Admitting: Nurse Practitioner

## 2025-03-28 ENCOUNTER — Inpatient Hospital Stay
